# Patient Record
Sex: Male | Born: 1940 | Race: White | Hispanic: No | Smoking: Former smoker
Health system: Southern US, Community
[De-identification: ages and names within clinical notes are randomized; demographics above are authoritative.]

## PROBLEM LIST (undated history)

## (undated) DIAGNOSIS — M1A9XX0 Chronic gout, unspecified, without tophus (tophi): Secondary | ICD-10-CM

## (undated) DIAGNOSIS — G2581 Restless legs syndrome: Secondary | ICD-10-CM

## (undated) DIAGNOSIS — I10 Essential (primary) hypertension: Secondary | ICD-10-CM

## (undated) DIAGNOSIS — G4733 Obstructive sleep apnea (adult) (pediatric): Secondary | ICD-10-CM

## (undated) DIAGNOSIS — J449 Chronic obstructive pulmonary disease, unspecified: Secondary | ICD-10-CM

## (undated) DIAGNOSIS — I509 Heart failure, unspecified: Secondary | ICD-10-CM

## (undated) DIAGNOSIS — E119 Type 2 diabetes mellitus without complications: Secondary | ICD-10-CM

## (undated) HISTORY — PX: OTHER SURGICAL HISTORY: SHX169

## (undated) HISTORY — DX: Type 2 diabetes mellitus without complications: E11.9

## (undated) HISTORY — DX: Restless legs syndrome: G25.81

## (undated) HISTORY — DX: Morbid (severe) obesity due to excess calories: E66.01

## (undated) HISTORY — DX: Obstructive sleep apnea (adult) (pediatric): G47.33

## (undated) HISTORY — DX: Chronic gout, unspecified, without tophus (tophi): M1A.9XX0

---

## 2019-10-19 DIAGNOSIS — I2699 Other pulmonary embolism without acute cor pulmonale: Secondary | ICD-10-CM

## 2019-10-19 HISTORY — DX: Other pulmonary embolism without acute cor pulmonale: I26.99

## 2019-11-05 ENCOUNTER — Emergency Department (HOSPITAL_COMMUNITY)
Admission: EM | Admit: 2019-11-05 | Discharge: 2019-11-05 | Disposition: A | Discharge status: Home / Self Care | Payer: Medicare PPO | Attending: Emergency Medicine | Admitting: Emergency Medicine

## 2019-11-05 ENCOUNTER — Encounter (HOSPITAL_COMMUNITY): Payer: Self-pay | Admitting: Emergency Medicine

## 2019-11-05 ENCOUNTER — Other Ambulatory Visit: Payer: Self-pay

## 2019-11-05 ENCOUNTER — Emergency Department (HOSPITAL_COMMUNITY): Payer: Medicare PPO

## 2019-11-05 DIAGNOSIS — Z5321 Procedure and treatment not carried out due to patient leaving prior to being seen by health care provider: Secondary | ICD-10-CM | POA: Insufficient documentation

## 2019-11-05 DIAGNOSIS — R0602 Shortness of breath: Secondary | ICD-10-CM | POA: Insufficient documentation

## 2019-11-05 HISTORY — DX: Essential (primary) hypertension: I10

## 2019-11-05 HISTORY — DX: Chronic obstructive pulmonary disease, unspecified: J44.9

## 2019-11-05 NOTE — ED Triage Notes (Signed)
Patient reports increasing SOB over the past 3 weeks, became much worse 3 days ago. Patient seen at Urgent Care and urged to come to ED for eval.

## 2019-11-05 NOTE — ED Notes (Signed)
Note from Registration states patient left at 1800.

## 2019-11-06 ENCOUNTER — Other Ambulatory Visit: Payer: Self-pay

## 2019-11-06 ENCOUNTER — Emergency Department (HOSPITAL_COMMUNITY): Payer: Medicare PPO

## 2019-11-06 ENCOUNTER — Observation Stay (HOSPITAL_COMMUNITY)
Admission: EM | Admit: 2019-11-06 | Discharge: 2019-11-07 | Disposition: A | Discharge status: Home / Self Care | Payer: Medicare PPO | Attending: Internal Medicine | Admitting: Internal Medicine

## 2019-11-06 ENCOUNTER — Encounter (HOSPITAL_COMMUNITY): Payer: Self-pay | Admitting: Internal Medicine

## 2019-11-06 DIAGNOSIS — I2694 Multiple subsegmental pulmonary emboli without acute cor pulmonale: Principal | ICD-10-CM | POA: Insufficient documentation

## 2019-11-06 DIAGNOSIS — G4733 Obstructive sleep apnea (adult) (pediatric): Secondary | ICD-10-CM | POA: Diagnosis present

## 2019-11-06 DIAGNOSIS — Z7951 Long term (current) use of inhaled steroids: Secondary | ICD-10-CM | POA: Diagnosis not present

## 2019-11-06 DIAGNOSIS — E119 Type 2 diabetes mellitus without complications: Secondary | ICD-10-CM | POA: Diagnosis not present

## 2019-11-06 DIAGNOSIS — M109 Gout, unspecified: Secondary | ICD-10-CM | POA: Insufficient documentation

## 2019-11-06 DIAGNOSIS — Z7984 Long term (current) use of oral hypoglycemic drugs: Secondary | ICD-10-CM | POA: Diagnosis not present

## 2019-11-06 DIAGNOSIS — J449 Chronic obstructive pulmonary disease, unspecified: Secondary | ICD-10-CM | POA: Diagnosis present

## 2019-11-06 DIAGNOSIS — I10 Essential (primary) hypertension: Secondary | ICD-10-CM | POA: Diagnosis not present

## 2019-11-06 DIAGNOSIS — R011 Cardiac murmur, unspecified: Secondary | ICD-10-CM | POA: Diagnosis not present

## 2019-11-06 DIAGNOSIS — Z87891 Personal history of nicotine dependence: Secondary | ICD-10-CM | POA: Insufficient documentation

## 2019-11-06 DIAGNOSIS — Z79899 Other long term (current) drug therapy: Secondary | ICD-10-CM | POA: Diagnosis not present

## 2019-11-06 DIAGNOSIS — R0602 Shortness of breath: Secondary | ICD-10-CM | POA: Diagnosis present

## 2019-11-06 DIAGNOSIS — Z20828 Contact with and (suspected) exposure to other viral communicable diseases: Secondary | ICD-10-CM | POA: Insufficient documentation

## 2019-11-06 DIAGNOSIS — I2693 Single subsegmental pulmonary embolism without acute cor pulmonale: Secondary | ICD-10-CM | POA: Diagnosis present

## 2019-11-06 LAB — CBC
HCT: 43.9 % (ref 39.0–52.0)
Hemoglobin: 15 g/dL (ref 13.0–17.0)
MCH: 34.9 pg — ABNORMAL HIGH (ref 26.0–34.0)
MCHC: 34.2 g/dL (ref 30.0–36.0)
MCV: 102.1 fL — ABNORMAL HIGH (ref 80.0–100.0)
Platelets: 170 10*3/uL (ref 150–400)
RBC: 4.3 MIL/uL (ref 4.22–5.81)
RDW: 14.1 % (ref 11.5–15.5)
WBC: 7.2 10*3/uL (ref 4.0–10.5)
nRBC: 0 % (ref 0.0–0.2)

## 2019-11-06 LAB — COMPREHENSIVE METABOLIC PANEL
ALT: 30 U/L (ref 0–44)
AST: 25 U/L (ref 15–41)
Albumin: 3.5 g/dL (ref 3.5–5.0)
Alkaline Phosphatase: 54 U/L (ref 38–126)
Anion gap: 10 (ref 5–15)
BUN: 14 mg/dL (ref 8–23)
CO2: 24 mmol/L (ref 22–32)
Calcium: 9.3 mg/dL (ref 8.9–10.3)
Chloride: 106 mmol/L (ref 98–111)
Creatinine, Ser: 0.97 mg/dL (ref 0.61–1.24)
GFR calc Af Amer: >60 mL/min (ref >60)
GFR calc non Af Amer: >60 mL/min (ref >60)
Glucose, Bld: 100 mg/dL — ABNORMAL HIGH (ref 70–99)
Potassium: 4 mmol/L (ref 3.5–5.1)
Sodium: 140 mmol/L (ref 135–145)
Total Bilirubin: 0.6 mg/dL (ref 0.3–1.2)
Total Protein: 6.8 g/dL (ref 6.5–8.1)

## 2019-11-06 LAB — SARS CORONAVIRUS 2 (TAT 6-24 HRS): SARS Coronavirus 2: NEGATIVE

## 2019-11-06 LAB — D-DIMER, QUANTITATIVE: D-Dimer, Quant: 5.89 ug/mL-FEU — ABNORMAL HIGH (ref 0.00–0.50)

## 2019-11-06 LAB — POC SARS CORONAVIRUS 2 AG -  ED: SARS Coronavirus 2 Ag: NEGATIVE

## 2019-11-06 MED ORDER — FLUTICASONE FUROATE-VILANTEROL 200-25 MCG/INH IN AEPB
1.0000 | INHALATION_SPRAY | Freq: Every day | RESPIRATORY_TRACT | Status: DC
  Administered 2019-11-07: 1 via RESPIRATORY_TRACT
  Filled 2019-11-06: qty 28

## 2019-11-06 MED ORDER — METFORMIN HCL 500 MG PO TABS
500.0000 mg | ORAL_TABLET | Freq: Two times a day (BID) | ORAL | Status: DC
  Administered 2019-11-07: 500 mg via ORAL
  Filled 2019-11-06: qty 1

## 2019-11-06 MED ORDER — INFLUENZA VAC A&B SA ADJ QUAD 0.5 ML IM PRSY: 0.5000 mL | PREFILLED_SYRINGE | INTRAMUSCULAR | Status: DC

## 2019-11-06 MED ORDER — ALBUTEROL SULFATE HFA 108 (90 BASE) MCG/ACT IN AERS
2.0000 | INHALATION_SPRAY | RESPIRATORY_TRACT | Status: DC | PRN
  Administered 2019-11-06: 2 via RESPIRATORY_TRACT
  Filled 2019-11-06: qty 6.7

## 2019-11-06 MED ORDER — LISINOPRIL 40 MG PO TABS
40.0000 mg | ORAL_TABLET | Freq: Every day | ORAL | Status: DC
  Administered 2019-11-07: 40 mg via ORAL
  Filled 2019-11-06: qty 1

## 2019-11-06 MED ORDER — ALLOPURINOL 300 MG PO TABS
300.0000 mg | ORAL_TABLET | Freq: Every day | ORAL | Status: DC
  Administered 2019-11-07: 300 mg via ORAL
  Filled 2019-11-06: qty 1

## 2019-11-06 MED ORDER — HEPARIN (PORCINE) 25000 UT/250ML-% IV SOLN
1300.0000 [IU]/h | INTRAVENOUS | Status: DC
  Administered 2019-11-06: 1700 [IU]/h via INTRAVENOUS
  Administered 2019-11-07: 1500 [IU]/h via INTRAVENOUS
  Filled 2019-11-06 (×2): qty 250

## 2019-11-06 MED ORDER — IOHEXOL 350 MG/ML SOLN
75.0000 mL | Freq: Once | INTRAVENOUS | Status: AC | PRN
  Administered 2019-11-06: 15:00:00 75 mL via INTRAVENOUS

## 2019-11-06 MED ORDER — ALBUTEROL SULFATE HFA 108 (90 BASE) MCG/ACT IN AERS
1.0000 | INHALATION_SPRAY | Freq: Four times a day (QID) | RESPIRATORY_TRACT | Status: DC | PRN
  Filled 2019-11-06: qty 6.7

## 2019-11-06 MED ORDER — HEPARIN BOLUS VIA INFUSION
6500.0000 [IU] | Freq: Once | INTRAVENOUS | Status: AC
  Administered 2019-11-06: 16:00:00 6500 [IU] via INTRAVENOUS
  Filled 2019-11-06: qty 6500

## 2019-11-06 MED ORDER — PREDNISONE 20 MG PO TABS
60.0000 mg | ORAL_TABLET | Freq: Once | ORAL | Status: AC
  Administered 2019-11-06: 13:00:00 60 mg via ORAL
  Filled 2019-11-06: qty 3

## 2019-11-06 MED ORDER — DEXAMETHASONE 4 MG PO TABS: 6.0000 mg | ORAL_TABLET | Freq: Every day | ORAL | Status: DC

## 2019-11-06 NOTE — ED Notes (Signed)
Pt ambulated in room on pulse ox. Pt o2 was 89-91% on RA. Pt was exteremly SOB when ambulating in room.

## 2019-11-06 NOTE — ED Provider Notes (Signed)
[facility] EMERGENCY DEPARTMENT Provider Note   CSN: [id number] Arrival date & time: 11/06/19  1122     History Chief Complaint  Patient presents with  . Shortness of Breath    Vincent Cooper is a 78 y.o. male.  HPI     89 male presents today complaining of shortness of breath.  He states he has had increasing shortness of breath over the past 3 to 4 days.  He has had a cough productive of whitish sputum.  Dyspnea worsens with any exertion sats dropped down to 88% per his and nursing report.  However he is not short of breath after he sits down and does not move he denies any fever, chills, nasal congestion, sore throat, headache, or other symptoms consistent with Covid.  He denies any known Covid exposures.  He was seen his primary care doctor yesterday in [place].  Reports he had a chest x-Saturnino Liew there.  He brings these records with him.  The chest x-Arryn Terrones impression reviewed and states no acute pulmonary process.  He was screened there for Covid and had a swab pending.  He denies any known exposure.  He reports that he lives alone.  States he has been going out to the grocery store and has been wearing a mask.  He is a former smoker but quit more than 30 years ago.  He reports taking his usual medications. Denies pain, peripheral swelling, history of DVT or PE.  Past Medical History:  Diagnosis Date  . COPD (chronic obstructive pulmonary disease) ([place])   . Diabetes mellitus without complication ([place])   . Hypertension     There are no problems to display for this patient.   No past surgical history on file.     No family history on file.  Social History   Tobacco Use  . Smoking status: Former Research scientist (life sciences)  . Smokeless tobacco: Former Network engineer Use Topics  . Alcohol use: Yes    Comment: Daily 2 shots of vodka  . Drug use: Never    Home Medications Prior to Admission medications   Not on File    Allergies    Other  Review of Systems   Review of  Systems  All other systems reviewed and are negative.   Physical Exam Updated Vital Signs BP (!) 143/69 (BP Location: Left Arm)   Pulse 83   Temp 98 F (36.7 C) (Oral)   Resp 16   SpO2 93%   Physical Exam Vitals reviewed.  Constitutional:      General: He is not in acute distress.    Appearance: He is well-developed. He is obese. He is not ill-appearing.  HENT:     Head: Normocephalic.     Mouth/Throat:     Mouth: Mucous membranes are moist.  Eyes:     Pupils: Pupils are equal, round, and reactive to light.  Cardiovascular:     Rate and Rhythm: Normal rate and regular rhythm.  Pulmonary:     Effort: Pulmonary effort is normal.     Breath sounds: Examination of the right-lower field reveals wheezing. Examination of the left-lower field reveals wheezing. Wheezing present.     Comments: Few mild expiratory wheezes Abdominal:     General: Bowel sounds are normal.     Palpations: Abdomen is soft.  Musculoskeletal:        General: Normal range of motion.     Cervical back: Normal range of motion.     Comments: Bilateral lower  extremity edema with slight erythema of the left lower extremity versus the right  Skin:    General: Skin is warm.     Capillary Refill: Capillary refill takes less than 2 seconds.  Neurological:     General: No focal deficit present.     Mental Status: He is alert and oriented to person, place, and time.  Psychiatric:        Mood and Affect: Mood normal.        Behavior: Behavior normal.     ED Results / Procedures / Treatments   Labs (all labs ordered are listed, but only abnormal results are displayed) Labs Reviewed - No data to display  EKG EKG Interpretation  Date/Time:  Saturday November 06 2019 11:46:37 EST Ventricular Rate:  81 PR Interval:    QRS Duration: 84 QT Interval:  366 QTC Calculation: 425 R Axis:   77 Text Interpretation: Sinus rhythm Baseline wander in lead(s) V3 V4 Poor data quality in current ECG precludes serial  comparison Confirmed by Pattricia Boss [phone]) on 11/06/2019 1:30:24 PM   Radiology CT Angio Chest PE W and/or Wo Contrast  Result Date: 11/06/2019 CLINICAL DATA:  Question of pulmonary embolism EXAM: CT ANGIOGRAPHY CHEST WITH CONTRAST TECHNIQUE: Multidetector CT imaging of the chest was performed using the standard protocol during bolus administration of intravenous contrast. Multiplanar CT image reconstructions and MIPs were obtained to evaluate the vascular anatomy. CONTRAST:  3mL OMNIPAQUE IOHEXOL 350 MG/ML SOLN COMPARISON:  None. FINDINGS: Cardiovascular: There is slightly suboptimal opacification of the main pulmonary artery. There is partially occlusive thrombus seen within the main posterior right lower lobe segmental and subsegmental pulmonary arterial branches. There is also probable small thrombus seen within anterior right middle lobe subsegmental pulmonary arterial branches. No evidence of right ventricular heart strain. The heart is normal in size. No pericardial effusion or thickening. There is normal three-vessel brachiocephalic anatomy without proximal stenosis. Scattered atherosclerosis is noted at the aortic arch and descending intrathoracic aorta. There is a small amount calcification at the origins of the great vessels. Coronary artery calcifications are seen. Mediastinum/Nodes: No hilar, mediastinal, or axillary adenopathy. Thyroid gland, trachea, and esophagus demonstrate no significant findings. Lungs/Pleura: A small amount of both subpleural bleb formation and emphysematous changes at the lung apices. No large airspace consolidation or pleural effusion. Upper Abdomen: No acute abnormalities present in the visualized portions of the upper abdomen. Musculoskeletal: No chest wall abnormality. No acute or significant osseous findings. Anterior flowing osteophytes seen in the midthoracic spine. Review of the MIP images confirms the above findings. IMPRESSION: 1. Slightly suboptimal  opacification of the main pulmonary artery. 2. Partially occlusive thrombus in the posterior right segmental and subsegmental pulmonary arterial branch and right middle lobe subsegmental branches. 3. No evidence of right ventricular heart strain. 4.  Aortic Atherosclerosis (ICD10-I70.0). 5. 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 6. These results were called by telephone at the time of interpretation on 11/06/2019 at 3:07 pm to provider [facility] Milayna Rotenberg , who verbally acknowledged these results. Electronically Signed   By: Prudencio Pair M.D.   On: 11/06/2019 15:09   DG Chest Port 1 View  Result Date: 11/06/2019 CLINICAL DATA:  Shortness of breath. EXAM: PORTABLE CHEST 1 VIEW COMPARISON:  None FINDINGS: Cardiomediastinal contours are mildly enlarged. Low lung volumes without signs of consolidation or pleural effusion. No signs of acute bone finding. IMPRESSION: Mild cardiac enlargement with low volumes and without acute cardiopulmonary process. The Electronically Signed   By: Cay Schillings  Wile M.D.   On: 11/06/2019 12:31    Procedures .Critical Care Performed by: Pattricia Boss, MD Authorized by: Pattricia Boss, MD   Critical care provider statement:    Critical care time (minutes):  45   Critical care end time:  11/06/2019 3:17 PM   Critical care was necessary to treat or prevent imminent or life-threatening deterioration of the following conditions:  Respiratory failure   Critical care was time spent personally by me on the following activities:  Discussions with consultants, evaluation of patient's response to treatment, examination of patient, ordering and performing treatments and interventions, ordering and review of laboratory studies, ordering and review of radiographic studies, pulse oximetry, re-evaluation of patient's condition, obtaining history from patient or surrogate and review of old charts   (including critical care time)  Medications Ordered in  ED Medications  albuterol (VENTOLIN HFA) 108 (90 Base) MCG/ACT inhaler 2 puff (has no administration in time range)  predniSONE (DELTASONE) tablet 60 mg (has no administration in time range)    ED Course  I have reviewed the triage vital signs and the nursing notes.  Pertinent labs & imaging results that were available during my care of the patient were reviewed by me and considered in my medical decision making (see chart for details). 78 year old man history of COPD presents today with worsening dyspnea over the past 3 to 4 days.  He has been seen and evaluated his primary care office and diagnosed [place].  There he had a Covid test and chest x-Bushra Denman that were reported as normal.  Here today he appears well sitting on the bed reports that he gets dyspneic with any exertion.  Work-up included chest x-Krisanne Lich that showed no evidence of acute abnormality, point-of-care Covid test that was negative, EKG without acute abnormalities and CBC and chemistry that showed no definitive etiology for symptoms.  D-dimer is elevated at 5.89 and subsequently CT angio chest obtained.  Received call from radiologist that shows segmental and subsegmental PE without acute right heart strain.  Heparin has been ordered.  Plan admission for further evaluation and treatment.  Discussed with Dr. Eileen Stanford and will see for admission MDM Rules/Calculators/A&P                     Vincent Cooper was evaluated in Emergency Department on 11/06/2019 for the symptoms described in the history of present illness. He was evaluated in the context of the global COVID-19 pandemic, which necessitated consideration that the patient might be at risk for infection with the SARS-CoV-2 virus that causes COVID-19. Institutional protocols and algorithms that pertain to the evaluation of patients at risk for COVID-19 are in a state of rapid change based on information released by regulatory bodies including the CDC and federal and state organizations. These  policies and algorithms were followed during the patient's care in the ED. Final Clinical Impression(s) / ED Diagnoses Final diagnoses:  Multiple subsegmental pulmonary emboli without acute cor pulmonale ([place])    Rx / DC Orders ED Discharge Orders    None       Pattricia Boss, MD 11/06/19 1537

## 2019-11-06 NOTE — Plan of Care (Signed)
  Problem: Health Behavior/Discharge Planning: Goal: Ability to manage health-related needs will improve Outcome: Progressing   Problem: Education: Goal: Knowledge of General Education information will improve Description: Including pain rating scale, medication(s)/side effects and non-pharmacologic comfort measures Outcome: Progressing   Problem: Clinical Measurements: Goal: Ability to maintain clinical measurements within normal limits will improve Outcome: Progressing Goal: Will remain free from infection Outcome: Progressing Goal: Diagnostic test results will improve Outcome: Progressing Goal: Respiratory complications will improve Outcome: Progressing Goal: Cardiovascular complication will be avoided Outcome: Progressing   Problem: Activity: Goal: Risk for activity intolerance will decrease Outcome: Progressing   Problem: Nutrition: Goal: Adequate nutrition will be maintained Outcome: Progressing   Problem: Coping: Goal: Level of anxiety will decrease Outcome: Progressing   Problem: Elimination: Goal: Will not experience complications related to bowel motility Outcome: Progressing Goal: Will not experience complications related to urinary retention Outcome: Progressing   Problem: Pain Managment: Goal: General experience of comfort will improve Outcome: Progressing   Problem: Safety: Goal: Ability to remain free from injury will improve Outcome: Progressing   Problem: Skin Integrity: Goal: Risk for impaired skin integrity will decrease Outcome: Progressing   

## 2019-11-06 NOTE — ED Triage Notes (Signed)
Patient complains of increased shortness of breath over the last 2-3 days. States he normally is short of breath, but that a few days ago he becomes noticeably more so after exertion. Does not wear oxygen at baseline, does sleep with a CPAP for sleep apnea.

## 2019-11-06 NOTE — H&P (Addendum)
Date: 11/06/2019               Patient Name:  Vincent Cooper MRN: [id number]  DOB: [birth year] Age / Sex: 78 y.o., male   PCP: [name], DO         Medical Service: Internal Medicine Teaching Service         Attending Physician: Dr. Lenice Pressman    First Contact: Dr. Gilford Rile Pager: [phone]  Second Contact: Dr. Eileen Stanford Pager: [phone]       After Hours (After 5p/  First Contact Pager: [phone]  weekends / holidays): Second Contact Pager: [phone]   Chief Complaint: Shortness of breath  History of Present Illness: Mr. Beeghly is a 78 y/o male, with a PMH of COPD, diabetes mellitus, and hypertension who presented to [facility] ED on November 05, 2019 with worsening dyspnea on exertion.  He was in his usual state of health until 2 nights ago when he began experiencing dyspnea that has been progressively worsening. He states that he dyspnea worsens after walking about 50 ft. He does have a home pulse Ox with O2 saturation in the 80s with ambulation, and increased to the 90s with 3-4 minutes of rest. At baseline, he was able to complete his activities of daily living but is currently limited by shortness of breath. He states that he visited his PCP in [place] who ordered a chest x-ray and Covid test however he is unsure of the results. He does not report of any recent exposure to coronavirus and states that he takes good care of himself and usually wears a mask each time he leaves the house.  He also endorses cough productive of white sputum which seems to be chronic and unchanged from his baseline. He denies chest pain, nausea, vomiting, recent long car rides, prolonged inactiveness, family history of DVT/PE. He also reports of 3 week history of lower extremity edema (mostly foot swelling) which he noticed initially on the right lower extremity and ultimately on the left lower extremity.    ED course: Afebrile, pulse 70s-90s, RR 14-21, BP 140s-150s/60s-70s, SPO2 93-100% on room air.  CBC  shows macrocytosis with MCV of 102, CMP unremarkable, D-dimer elevated at 5.8, point-of-care coronavirus test unremarkable, CT angiography chest reviewed occlusive thrombus in the posterior right segmental and subsegmental pulmonary artery branch and right middle lobe subsegmental branches, no evidence of right heart strain.  He was started on heparin and internal medicine teaching service was consulted for admission.   Meds: Current Meds  Medication Sig  . albuterol (VENTOLIN HFA) 108 (90 Base) MCG/ACT inhaler Inhale 1 puff into the lungs every 6 (six) hours as needed for wheezing or shortness of breath.  . allopurinol (ZYLOPRIM) 300 MG tablet Take 300 mg by mouth daily.  . budesonide-formoterol (SYMBICORT) 160-4.5 MCG/ACT inhaler Inhale 2 puffs into the lungs every morning.  Marland Kitchen lisinopril (ZESTRIL) 40 MG tablet Take 40 mg by mouth daily.  . metFORMIN (GLUCOPHAGE) 500 MG tablet Take 500 mg by mouth 2 (two) times daily with a meal.     Allergies: Allergies as of 11/06/2019 - Review Complete 11/06/2019  Allergen Reaction Noted  . Other  11/05/2019   Past Medical History:  Diagnosis Date  . COPD (chronic obstructive pulmonary disease) ([place])   . Diabetes mellitus without complication ([place])   . Hypertension     Family History:  - Denies FH of DM. - Denies FH of cancer.  Social History:  - Has EtOH drink a night,  but has not had a drink in 4-5 days.  - Previous tobacco user. Quit smoking 20 years ago, and stopped his dip use 6 months ago.  - Lives home alone.  - Currently starting a new diet in attempts to lose weight.   Review of Systems: A complete ROS was negative except as per HPI.   Physical Exam: Blood pressure 134/72, pulse 86, temperature 98 F (36.7 C), temperature source Oral, resp. rate 16, SpO2 (!) 87 %. Physical Exam Vitals and nursing note reviewed.  Constitutional:      General: He is not in acute distress.    Appearance: Normal appearance. He is well-developed.  He is not ill-appearing or toxic-appearing.     Comments: Sitting comfortably in bed. No acute distress.   HENT:     Head: Normocephalic and atraumatic.  Eyes:     General:        Right eye: No discharge.        Left eye: No discharge.     Conjunctiva/sclera: Conjunctivae normal.  Cardiovascular:     Rate and Rhythm: Normal rate and regular rhythm.     Pulses: Normal pulses.     Heart sounds: Normal heart sounds. No murmur. No friction rub. No gallop.   Pulmonary:     Effort: Pulmonary effort is normal.     Breath sounds: Examination of the left-upper field reveals wheezing. Wheezing present. No decreased breath sounds, rhonchi or rales.     Comments: Wheezes auscultated in the upper left lung field.  Abdominal:     General: Bowel sounds are normal.     Palpations: Abdomen is soft.     Tenderness: There is no abdominal tenderness. There is no guarding.  Musculoskeletal:        General: No swelling.     Right lower leg: No edema.     Left lower leg: No edema.  Neurological:     General: No focal deficit present.     Mental Status: He is alert and oriented to person, place, and time.  Psychiatric:        Mood and Affect: Mood normal.        Behavior: Behavior normal.     EKG: personally reviewed my interpretation is sinus rhythm with no R heart strain.  CXR: personally reviewed my interpretation is mild cardiomegaly with no pleural effusion or consolidation.   CT Angio Chest PE w or wo Contrast:  IMPRESSION: 1. Slightly suboptimal opacification of the main pulmonary artery. 2. Partially occlusive thrombus in the posterior right segmental and subsegmental pulmonary arterial branch and right middle lobe subsegmental branches. 3. No evidence of right ventricular heart strain. 4.  Aortic Atherosclerosis (ICD10-I70.0). 5. 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 6. These results were called by telephone at the time of interpretation on  11/06/2019 at 3:07 pm to provider [facility] RAY, who verbally acknowledged these results.  Assessment & Plan by Problem: Active Problems:   Pulmonary embolism [facility]) Mr. Jazper Jolly is a 78 y/o male, with a PMH of COPD, diabetes mellitus, and HTN, who presents to IMTS from the [facility] with Pulmonary Emboli.    Patient presented to the ED with [facility], and hypoxia while ambulating, which was confirmed from a home pulse ox as well as the ED. His D-dimer was elevated and a CT angio chest revealed a segmental and subsegmental PE with and EKG that showed no R heart strain. He was subsequently placed on heparin.   Assessment:  Pulmonary Emboli:  - Patient found to have segmental and subsegmental PE on CT angio.  - Continue heparin - Heparin Level ordered - CBC ordered - Continue to monitor vitals - Continue Telemetry   COPD:  - Continue Breo Ellipta  1 puff QD - Continue Albuterol 1 puff q6H PRN - Continue to monitor vitals   Gout:  - Continue home allopurinol 300 mg QD   Hypertension:  - Continue Lisinopril 40 mg QD.    OSA:  - CPAP ordered QHS PRN.   DM:  - Continue Metformin 500 mg BID. - BMP ordered  Diet: Low sodium  VTE ppx: Lovenox CODE STATUS: FULL  Dispo: Admit patient to Observation with expected length of stay less than 2 midnights.  Signed: Maudie Mercury, MD 11/06/2019, 5:18 PM  Pager: [phone] Internal Medicine Teaching Service

## 2019-11-06 NOTE — Progress Notes (Signed)
[place] for heparin Indication: pulmonary embolus  Heparin Dosing Weight: 97.2 kg  Labs: Recent Labs    11/06/19 1240  HGB 15.0  HCT 43.9  PLT 170  CREATININE 0.97    Assessment: 28 yom presenting with SOB, found to have acute PE with no RHS. Pharmacy consulted to dose heparin. Patient is not on anticoagulation PTA. CBC wnl. No active bleed issues documented.  Goal of Therapy:  Heparin level 0.3-0.7 units/ml Monitor platelets by anticoagulation protocol: Yes   Plan:  Heparin 6500 unit bolus Start heparin at 1700 units/h 6h heparin level Daily heparin level/CBC Monitor s/sx bleeding   Elicia Lamp, PharmD, BCPS Clinical Pharmacist 11/06/2019 3:12 PM

## 2019-11-07 ENCOUNTER — Observation Stay (HOSPITAL_BASED_OUTPATIENT_CLINIC_OR_DEPARTMENT_OTHER): Payer: Medicare PPO

## 2019-11-07 DIAGNOSIS — J449 Chronic obstructive pulmonary disease, unspecified: Secondary | ICD-10-CM

## 2019-11-07 DIAGNOSIS — R0602 Shortness of breath: Secondary | ICD-10-CM | POA: Diagnosis not present

## 2019-11-07 DIAGNOSIS — R011 Cardiac murmur, unspecified: Secondary | ICD-10-CM | POA: Diagnosis not present

## 2019-11-07 DIAGNOSIS — Z79899 Other long term (current) drug therapy: Secondary | ICD-10-CM

## 2019-11-07 DIAGNOSIS — I10 Essential (primary) hypertension: Secondary | ICD-10-CM | POA: Diagnosis present

## 2019-11-07 DIAGNOSIS — Z7984 Long term (current) use of oral hypoglycemic drugs: Secondary | ICD-10-CM

## 2019-11-07 DIAGNOSIS — E119 Type 2 diabetes mellitus without complications: Secondary | ICD-10-CM

## 2019-11-07 DIAGNOSIS — G4733 Obstructive sleep apnea (adult) (pediatric): Secondary | ICD-10-CM

## 2019-11-07 DIAGNOSIS — I2693 Single subsegmental pulmonary embolism without acute cor pulmonale: Secondary | ICD-10-CM

## 2019-11-07 DIAGNOSIS — Z7951 Long term (current) use of inhaled steroids: Secondary | ICD-10-CM

## 2019-11-07 DIAGNOSIS — M109 Gout, unspecified: Secondary | ICD-10-CM

## 2019-11-07 DIAGNOSIS — Z9989 Dependence on other enabling machines and devices: Secondary | ICD-10-CM

## 2019-11-07 LAB — CBC
HCT: 37.4 % — ABNORMAL LOW (ref 39.0–52.0)
Hemoglobin: 12.7 g/dL — ABNORMAL LOW (ref 13.0–17.0)
MCH: 34.4 pg — ABNORMAL HIGH (ref 26.0–34.0)
MCHC: 34 g/dL (ref 30.0–36.0)
MCV: 101.4 fL — ABNORMAL HIGH (ref 80.0–100.0)
Platelets: 177 10*3/uL (ref 150–400)
RBC: 3.69 MIL/uL — ABNORMAL LOW (ref 4.22–5.81)
RDW: 13.9 % (ref 11.5–15.5)
WBC: 8.9 10*3/uL (ref 4.0–10.5)
nRBC: 0 % (ref 0.0–0.2)

## 2019-11-07 LAB — BASIC METABOLIC PANEL
Anion gap: 9 (ref 5–15)
BUN: 16 mg/dL (ref 8–23)
CO2: 23 mmol/L (ref 22–32)
Calcium: 8.8 mg/dL — ABNORMAL LOW (ref 8.9–10.3)
Chloride: 105 mmol/L (ref 98–111)
Creatinine, Ser: 0.79 mg/dL (ref 0.61–1.24)
GFR calc Af Amer: >60 mL/min (ref >60)
GFR calc non Af Amer: >60 mL/min (ref >60)
Glucose, Bld: 158 mg/dL — ABNORMAL HIGH (ref 70–99)
Potassium: 4 mmol/L (ref 3.5–5.1)
Sodium: 137 mmol/L (ref 135–145)

## 2019-11-07 LAB — ECHOCARDIOGRAM COMPLETE
Height: 69 in
Weight: 4160 oz

## 2019-11-07 LAB — HEPARIN LEVEL (UNFRACTIONATED)
Heparin Unfractionated: 0.97 IU/mL — ABNORMAL HIGH (ref 0.30–0.70)
Heparin Unfractionated: 0.9 IU/mL — ABNORMAL HIGH (ref 0.30–0.70)

## 2019-11-07 MED ORDER — APIXABAN 5 MG PO TABS: 5.0000 mg | ORAL_TABLET | Freq: Two times a day (BID) | ORAL | Status: DC

## 2019-11-07 MED ORDER — APIXABAN 5 MG PO TABS: 10.0000 mg | ORAL_TABLET | Freq: Two times a day (BID) | ORAL | Status: DC

## 2019-11-07 MED ORDER — ENOXAPARIN SODIUM 300 MG/3ML IJ SOLN: 1.5000 mg/kg | INTRAMUSCULAR | Status: DC

## 2019-11-07 MED ORDER — APIXABAN 5 MG PO TABS: 10.0000 mg | ORAL_TABLET | Freq: Two times a day (BID) | ORAL | 0 refills | Status: DC

## 2019-11-07 MED ORDER — APIXABAN 5 MG PO TABS: 5.0000 mg | ORAL_TABLET | Freq: Two times a day (BID) | ORAL | 3 refills | Status: DC

## 2019-11-07 MED ORDER — APIXABAN 5 MG PO TABS
10.0000 mg | ORAL_TABLET | Freq: Two times a day (BID) | ORAL | Status: DC
  Administered 2019-11-07: 10 mg via ORAL
  Filled 2019-11-07: qty 2

## 2019-11-07 NOTE — Progress Notes (Signed)
[place] for heparin Indication: pulmonary embolus  Heparin Dosing Weight: 97.2 kg  Labs: Recent Labs    11/06/19 1240 11/06/19 2308  HGB 15.0  --   HCT 43.9  --   PLT 170  --   HEPARINUNFRC  --  0.97*  CREATININE 0.97  --     Assessment: 78 y.o. male with PE for heparin  Goal of Therapy:  Heparin level 0.3-0.7 units/ml Monitor platelets by anticoagulation protocol: Yes   Plan:  Decrease Heparin 1500 units/hr Follow-up am labs.  Phillis Knack, PharmD, BCPS  11/07/2019 12:31 AM

## 2019-11-07 NOTE — Progress Notes (Signed)
CSW acknowledges consult for new Elloquis. TOC staff will provide resources for medication assistance.   CSW will continue to follow and assist with additional TOC needs.    Domenic Schwab, MSW, [place] Worker [facility]  [phone]

## 2019-11-07 NOTE — Discharge Instructions (Signed)
Information on my medicine - ELIQUIS (apixaban)  Why was Eliquis prescribed for you? Eliquis was prescribed to treat blood clots that may have been found in the veins of your legs (deep vein thrombosis) or in your lungs (pulmonary embolism) and to reduce the risk of them occurring again.  What do You need to know about Eliquis ? The starting dose is 10 mg (two 5 mg tablets) taken TWICE daily for the FIRST SEVEN (7) DAYS, then on (enter date)  11/14/2019  the dose is reduced to ONE 5 mg tablet taken TWICE daily.  Eliquis may be taken with or without food.   Try to take the dose about the same time in the morning and in the evening. If you have difficulty swallowing the tablet whole please discuss with your pharmacist how to take the medication safely.  Take Eliquis exactly as prescribed and DO NOT stop taking Eliquis without talking to the doctor who prescribed the medication.  Stopping may increase your risk of developing a new blood clot.  Refill your prescription before you run out.  After discharge, you should have regular check-up appointments with your healthcare provider that is prescribing your Eliquis.    What do you do if you miss a dose? If a dose of ELIQUIS is not taken at the scheduled time, take it as soon as possible on the same day and twice-daily administration should be resumed. The dose should not be doubled to make up for a missed dose.  Important Safety Information A possible side effect of Eliquis is bleeding. You should call your healthcare provider right away if you experience any of the following: ? Bleeding from an injury or your nose that does not stop. ? Unusual colored urine (red or dark brown) or unusual colored stools (red or black). ? Unusual bruising for unknown reasons. ? A serious fall or if you hit your head (even if there is no bleeding).  Some medicines may interact with Eliquis and might increase your risk of bleeding or clotting while on  Eliquis. To help avoid this, consult your healthcare provider or pharmacist prior to using any new prescription or non-prescription medications, including herbals, vitamins, non-steroidal anti-inflammatory drugs (NSAIDs) and supplements.  This website has more information on Eliquis (apixaban): http://www.eliquis.com/eliquis/home

## 2019-11-07 NOTE — Progress Notes (Signed)
Verbally phoned Eliquis starter pack prescription to [facility] on [address], [place], [place]. Alford Highland, PharmD, BCPS Clinical Staff Pharmacist [email or web address]

## 2019-11-07 NOTE — Care Management (Signed)
Provided Eliquis card to patient and family at bedside. Could not reach MD to clarify where Rx sent.  Vincent Cooper who will [facility] script to [facility].  Patient and family at bedside agreeable to pick up Rx at pharmacy.

## 2019-11-07 NOTE — Progress Notes (Signed)
Received call from CCMD re: patient SVT. MD notified. No new orders. Will continue to monitor.

## 2019-11-07 NOTE — Progress Notes (Signed)
Pt requested to go to the bathroom, RN removed CPAP mask to allow patient to ambulate to the restroom. Upon returning to bed, pt requests CPAP machine turned off and states "I've worn it long enough tonight, I will be fine without it for the rest of the night." Pt was educated on importance of wearing the mask while sleeping and pt understood. Will continue to monitor.

## 2019-11-07 NOTE — Progress Notes (Signed)
  Echocardiogram 2D Echocardiogram has been performed.  Johny Chess 11/07/2019, 11:14 AM

## 2019-11-07 NOTE — Progress Notes (Signed)
ANTICOAGULATION CONSULT NOTE - Follow Up Consult  Pharmacy Consult for Heparin Indication: pulmonary embolus  Allergies  Allergen Reactions  . Other     Patient reports he was allergic to something in an IV he was given but does not know what the substance was.    Patient Measurements: Height: 5\' 9"  (175.3 cm) Weight: 260 lb (117.9 kg) IBW/kg (Calculated) : 70.7 Heparin Dosing Weight: 97.2 kg  Vital Signs: Temp: 97.7 F (36.5 C) (12/20 0722) Temp Source: Oral (12/20 0722) BP: 168/112 (12/20 0722) Pulse Rate: 79 (12/20 0722)  Labs: Recent Labs    11/06/19 1240 11/06/19 2308 11/07/19 0500 11/07/19 0653  HGB 15.0  --  12.7*  --   HCT 43.9  --  37.4*  --   PLT 170  --  177  --   HEPARINUNFRC  --  0.97*  --  0.90*  CREATININE 0.97  --  0.79  --     Estimated Creatinine Clearance: 98 mL/min (by C-G formula based on SCr of 0.79 mg/dL).  Assessment: Anticoag:  PE. Heparin level 0.97>0.9 (in 6 hrs). Hgb 15>>12.7. Plts stable.  Goal of Therapy:  Heparin level 0.3-0.7 units/ml Monitor platelets by anticoagulation protocol: Yes   Plan:  Decrease IV heparin to 1300 units/hr (13 units/kg/hr) Recheck heparin level in 8 hrs Daily heparin level/CBC  Bralyn Espino S. Alford Highland, PharmD, BCPS Clinical Staff Pharmacist [email or web address] Alford Highland, Thomas 11/07/2019,8:09 AM

## 2019-11-07 NOTE — Progress Notes (Addendum)
Subjective:   Vincent Cooper was examined this morning and he complained about his CPAP mask as it did not fit right on his face. He slept quite well and denies shortness of breath, pleuritic pain. We discussed switching his medications to eliquis today from his IV heparin. He was agreeable to the plan.    Objective:  Vital signs in last 24 hours: Vitals:   11/06/19 1645 11/06/19 1700 11/06/19 2319 11/07/19 0000  BP: 134/72 (!) 147/72 131/67   Pulse: 86 89 78 77  Resp: 16 18 20 18   Temp:  97.8 F (36.6 C) 97.7 F (36.5 C)   TempSrc:  Oral Oral   SpO2: (!) 87% 93% 96% 97%  Weight:  117.9 kg    Height:  5\' 9"  (1.753 m)     Physical Exam Vitals and nursing note reviewed.  Constitutional:      General: He is not in acute distress.    Appearance: Normal appearance. He is not ill-appearing or toxic-appearing.  HENT:     Head: Normocephalic and atraumatic.  Eyes:     General:        Right eye: No discharge.        Left eye: No discharge.     Conjunctiva/sclera: Conjunctivae normal.  Cardiovascular:     Rate and Rhythm: Normal rate and regular rhythm.     Pulses: Normal pulses.     Heart sounds: Murmur present. No friction rub. No gallop.      Comments: Systolic murmur appreciated during auscultation. Pulmonary:     Effort: Pulmonary effort is normal.     Breath sounds: Normal breath sounds. No wheezing, rhonchi or rales.  Abdominal:     General: Bowel sounds are normal.     Palpations: Abdomen is soft.     Tenderness: There is no abdominal tenderness. There is no guarding.  Musculoskeletal:        General: No swelling.     Right lower leg: No edema.     Left lower leg: No edema.  Neurological:     General: No focal deficit present.     Mental Status: He is alert and oriented to person, place, and time.  Psychiatric:        Mood and Affect: Mood normal.        Behavior: Behavior normal.    CBC    Component Value Date/Time   WBC 8.9 11/07/2019 0500   RBC 3.69 (L)  11/07/2019 0500   HGB 12.7 (L) 11/07/2019 0500   HCT 37.4 (L) 11/07/2019 0500   PLT 177 11/07/2019 0500   MCV 101.4 (H) 11/07/2019 0500   MCH 34.4 (H) 11/07/2019 0500   MCHC 34.0 11/07/2019 0500   RDW 13.9 11/07/2019 0500     BMP Latest Ref Rng & Units 11/07/2019 11/06/2019  Glucose 70 - 99 mg/dL 158(H) 100(H)  BUN 8 - 23 mg/dL 16 14  Creatinine 0.61 - 1.24 mg/dL 0.79 0.97  Sodium 135 - 145 mmol/L 137 140  Potassium 3.5 - 5.1 mmol/L 4.0 4.0  Chloride 98 - 111 mmol/L 105 106  CO2 22 - 32 mmol/L 23 24  Calcium 8.9 - 10.3 mg/dL 8.8(L) 9.3   Assessment/Plan:  Active Problems:   Pulmonary embolism [facility])   [name] is 78 y/o male, with a PMH of COPD, DM, and HTN, who presented to the [facility] with segmental and subsegmental PE.   Assessment:    Segmental and Subsegmental Pulmonary Emboli:  - Switched heparin over  to Eliquis - Continue monitoring vitals - Continue Telemetry  Systolic Heart Murmur:  Systolic murmur appreciated on physical examination, given patient's age, likely aortic stenosis, but echocardiogram will be ordered for assessment.  - Echocardiogram ordered.  COPD:  - Continue Breo Ellipta  1 puff QD - Continue Albuterol 1 puff q6H PRN - Continue to monitor vitals   Gout:  - Continue home allopurinol 300 mg QD   Hypertension:  - Continue Lisinopril 40 mg QD.    OSA:  - Continue CPAP QHS PRN.   DM:  - Continue Metformin 500 mg BID. - BMP ordered  Diet: Low sodium  VTE ppx: Lovenox CODE STATUS: FULL  Dispo: Anticipated discharge in approximately pending course.   Maudie Mercury, MD IMTS, PGY-1 Pager: [phone] 11/07/2019,6:02 AM

## 2019-11-13 ENCOUNTER — Emergency Department (HOSPITAL_COMMUNITY)
Admission: EM | Admit: 2019-11-13 | Discharge: 2019-11-13 | Disposition: A | Discharge status: Home / Self Care | Payer: Medicare PPO | Attending: Emergency Medicine | Admitting: Emergency Medicine

## 2019-11-13 ENCOUNTER — Other Ambulatory Visit: Payer: Self-pay

## 2019-11-13 ENCOUNTER — Encounter (HOSPITAL_COMMUNITY): Payer: Self-pay | Admitting: Emergency Medicine

## 2019-11-13 ENCOUNTER — Emergency Department (HOSPITAL_COMMUNITY): Payer: Medicare PPO

## 2019-11-13 DIAGNOSIS — Z87891 Personal history of nicotine dependence: Secondary | ICD-10-CM | POA: Insufficient documentation

## 2019-11-13 DIAGNOSIS — Z7901 Long term (current) use of anticoagulants: Secondary | ICD-10-CM | POA: Diagnosis not present

## 2019-11-13 DIAGNOSIS — E119 Type 2 diabetes mellitus without complications: Secondary | ICD-10-CM | POA: Diagnosis not present

## 2019-11-13 DIAGNOSIS — J441 Chronic obstructive pulmonary disease with (acute) exacerbation: Secondary | ICD-10-CM | POA: Diagnosis not present

## 2019-11-13 DIAGNOSIS — Z20828 Contact with and (suspected) exposure to other viral communicable diseases: Secondary | ICD-10-CM | POA: Diagnosis not present

## 2019-11-13 DIAGNOSIS — I10 Essential (primary) hypertension: Secondary | ICD-10-CM | POA: Insufficient documentation

## 2019-11-13 DIAGNOSIS — R52 Pain, unspecified: Secondary | ICD-10-CM

## 2019-11-13 DIAGNOSIS — Z7984 Long term (current) use of oral hypoglycemic drugs: Secondary | ICD-10-CM | POA: Diagnosis not present

## 2019-11-13 DIAGNOSIS — Z79899 Other long term (current) drug therapy: Secondary | ICD-10-CM | POA: Insufficient documentation

## 2019-11-13 DIAGNOSIS — R0602 Shortness of breath: Secondary | ICD-10-CM | POA: Diagnosis present

## 2019-11-13 LAB — BASIC METABOLIC PANEL
Anion gap: 7 (ref 5–15)
BUN: 12 mg/dL (ref 8–23)
CO2: 25 mmol/L (ref 22–32)
Calcium: 9.4 mg/dL (ref 8.9–10.3)
Chloride: 105 mmol/L (ref 98–111)
Creatinine, Ser: 0.83 mg/dL (ref 0.61–1.24)
GFR calc Af Amer: >60 mL/min (ref >60)
GFR calc non Af Amer: >60 mL/min (ref >60)
Glucose, Bld: 147 mg/dL — ABNORMAL HIGH (ref 70–99)
Potassium: 4.1 mmol/L (ref 3.5–5.1)
Sodium: 137 mmol/L (ref 135–145)

## 2019-11-13 LAB — POC SARS CORONAVIRUS 2 AG -  ED: SARS Coronavirus 2 Ag: NEGATIVE

## 2019-11-13 LAB — CBC
HCT: 42.3 % (ref 39.0–52.0)
Hemoglobin: 14.3 g/dL (ref 13.0–17.0)
MCH: 35 pg — ABNORMAL HIGH (ref 26.0–34.0)
MCHC: 33.8 g/dL (ref 30.0–36.0)
MCV: 103.4 fL — ABNORMAL HIGH (ref 80.0–100.0)
Platelets: 230 10*3/uL (ref 150–400)
RBC: 4.09 MIL/uL — ABNORMAL LOW (ref 4.22–5.81)
RDW: 13.8 % (ref 11.5–15.5)
WBC: 6.5 10*3/uL (ref 4.0–10.5)
nRBC: 0 % (ref 0.0–0.2)

## 2019-11-13 LAB — TROPONIN I (HIGH SENSITIVITY)
Troponin I (High Sensitivity): 4 ng/L (ref <18)
Troponin I (High Sensitivity): 4 ng/L (ref <18)

## 2019-11-13 MED ORDER — ALBUTEROL SULFATE HFA 108 (90 BASE) MCG/ACT IN AERS
8.0000 | INHALATION_SPRAY | Freq: Once | RESPIRATORY_TRACT | Status: AC
  Administered 2019-11-13: 8 via RESPIRATORY_TRACT
  Filled 2019-11-13: qty 6.7

## 2019-11-13 MED ORDER — IPRATROPIUM-ALBUTEROL 0.5-2.5 (3) MG/3ML IN SOLN: 3.0000 mL | Freq: Once | RESPIRATORY_TRACT | Status: DC

## 2019-11-13 MED ORDER — IPRATROPIUM BROMIDE HFA 17 MCG/ACT IN AERS
2.0000 | INHALATION_SPRAY | Freq: Once | RESPIRATORY_TRACT | Status: AC
  Administered 2019-11-13: 2 via RESPIRATORY_TRACT
  Filled 2019-11-13: qty 12.9

## 2019-11-13 MED ORDER — MAGNESIUM SULFATE 2 GM/50ML IV SOLN
2.0000 g | Freq: Once | INTRAVENOUS | Status: AC
  Administered 2019-11-13: 2 g via INTRAVENOUS
  Filled 2019-11-13: qty 50

## 2019-11-13 MED ORDER — PREDNISONE 20 MG PO TABS
60.0000 mg | ORAL_TABLET | Freq: Once | ORAL | Status: AC
  Administered 2019-11-13: 60 mg via ORAL
  Filled 2019-11-13: qty 3

## 2019-11-13 MED ORDER — SODIUM CHLORIDE 0.9% FLUSH
3.0000 mL | Freq: Once | INTRAVENOUS | Status: AC
  Administered 2019-11-13: 3 mL via INTRAVENOUS

## 2019-11-13 MED ORDER — PREDNISONE 20 MG PO TABS: 40.0000 mg | ORAL_TABLET | Freq: Every day | ORAL | 0 refills | Status: DC

## 2019-11-13 NOTE — ED Provider Notes (Signed)
[place] EMERGENCY DEPARTMENT Provider Note   CSN: [id number] Arrival date & time: 11/13/19  1144     History Chief Complaint  Patient presents with  . Shortness of Breath    Amedio Martines is a 78 y.o. male with history of COPD, hypertension, diabetes, and recent diagnosis of PE on Eliquis presents with shortness of breath.  He states that after he was discharged from the hospital on December 20 he felt relatively well.  Last night he had gradually worsening shortness of breath.  He reports associated wheezing, coughing productive of white sputum.  He feels very short of breath with any exertion so he decided to come to the ED today.  He reports compliance with his Eliquis.  He denies syncope, chest pain, fever.  He reports getting temporary relief with his home inhalers.  He had a negative Covid test on December 19. He is a former smoker.  HPI     Past Medical History:  Diagnosis Date  . COPD (chronic obstructive pulmonary disease) ([place])   . Diabetes mellitus without complication ([place])   . Hypertension     Patient Active Problem List   Diagnosis Date Noted  . Cardiac murmur 11/07/2019  . COPD (chronic obstructive pulmonary disease) ([place]) 11/07/2019  . Diabetes mellitus ([place]) 11/07/2019  . 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 Essential hypertension 11/07/2019  . Pulmonary embolism ([place]) 11/06/2019    History reviewed. No pertinent surgical history.     No family history on file.  Social History   Tobacco Use  . Smoking status: Former Research scientist (life sciences)  . Smokeless tobacco: Former Network engineer Use Topics  . Alcohol use: Yes    Comment: Daily 2 shots of vodka  . Drug use: Never    Home Medications Prior to Admission medications   Medication Sig Start Date End Date Taking? Authorizing Provider  albuterol (VENTOLIN HFA) 108 (90 Base) MCG/ACT inhaler Inhale 1 puff into the lungs every 6 (six) hours as needed for wheezing or shortness of  breath.    [provider]  allopurinol (ZYLOPRIM) 300 MG tablet Take 300 mg by mouth daily.    [provider]  apixaban (ELIQUIS) 5 MG TABS tablet Take 2 tablets (10 mg total) by mouth 2 (two) times daily for 6 days. 11/07/19 11/13/19  Jean Rosenthal, MD  apixaban (ELIQUIS) 5 MG TABS tablet Take 1 tablet (5 mg total) by mouth 2 (two) times daily. START 5mg  BID by mouth ON 11/20/2018 11/21/19   Jean Rosenthal, MD  budesonide-formoterol (SYMBICORT) 160-4.5 MCG/ACT inhaler Inhale 2 puffs into the lungs every morning.    [provider]  lisinopril (ZESTRIL) 40 MG tablet Take 40 mg by mouth daily.    [provider]  metFORMIN (GLUCOPHAGE) 500 MG tablet Take 500 mg by mouth 2 (two) times daily with a meal.    [provider]    Allergies    Other  Review of Systems   Review of Systems  Constitutional: Negative for chills and fever.  Respiratory: Positive for cough, shortness of breath and wheezing.   Cardiovascular: Negative for chest pain.  Gastrointestinal: Negative for abdominal pain.  Neurological: Negative for syncope and light-headedness.  Hematological: Bruises/bleeds easily.  All other systems reviewed and are negative.   Physical Exam Updated Vital Signs BP (!) 153/72 (BP Location: Right Arm)   Pulse 90   Temp (!) 97.4 F (36.3 C) (Oral)   Resp 20   SpO2  99%   Physical Exam Vitals and nursing note reviewed.  Constitutional:      General: He is not in acute distress.    Appearance: He is well-developed. He is obese. He is not ill-appearing.     Comments: Calm, cooperative. Mild respiratory distress with audible wheezing  HENT:     Head: Normocephalic and atraumatic.  Eyes:     General: No scleral icterus.       Right eye: No discharge.        Left eye: No discharge.     Conjunctiva/sclera: Conjunctivae normal.     Pupils: Pupils are equal, round, and reactive to light.  Cardiovascular:     Rate and Rhythm: Normal rate and  regular rhythm.  Pulmonary:     Effort: Tachypnea and respiratory distress present.     Breath sounds: Decreased breath sounds and wheezing (diffuse) present.  Abdominal:     General: There is no distension.  Musculoskeletal:     Cervical back: Normal range of motion.  Skin:    General: Skin is warm and dry.  Neurological:     Mental Status: He is alert and oriented to person, place, and time.  Psychiatric:        Behavior: Behavior normal.     ED Results / Procedures / Treatments   Labs (all labs ordered are listed, but only abnormal results are displayed) Labs Reviewed  BASIC METABOLIC PANEL - Abnormal; Notable for the following components:      Result Value   Glucose, Bld 147 (*)    All other components within normal limits  CBC - Abnormal; Notable for the following components:   RBC 4.09 (*)    MCV 103.4 (*)    MCH 35.0 (*)    All other components within normal limits  POC SARS CORONAVIRUS 2 AG -  ED  TROPONIN I (HIGH SENSITIVITY)  TROPONIN I (HIGH SENSITIVITY)    EKG EKG Interpretation  Date/Time:  Saturday November 13 2019 12:18:59 EST Ventricular Rate:  83 PR Interval:  144 QRS Duration: 82 QT Interval:  360 QTC Calculation: 423 R Axis:   66 Text Interpretation: Normal sinus rhythm Normal ECG Confirmed by Madalyn Rob [phone]) on 11/13/2019 2:33:09 PM   Radiology DG Chest 1 View  Result Date: 11/13/2019 CLINICAL DATA:  Shortness of breath for 10 days, negative COVID-19 test when seen at hospital 1 week ago, history COPD, hypertension, diabetes mellitus, former smoker EXAM: CHEST  1 VIEW COMPARISON:  Portable exam of 11/06/2019 FINDINGS: Borderline enlargement of cardiac silhouette. Mediastinal contours and pulmonary vascularity normal. Atherosclerotic calcification aorta. Lungs clear. No pulmonary infiltrate, pleural effusion, or pneumothorax. Osseous structures unremarkable. IMPRESSION: No acute abnormalities. Aortic Atherosclerosis (ICD10-I70.0).  Electronically Signed   By: Lavonia Dana M.D.   On: 11/13/2019 13:03    Procedures Procedures (including critical care time)  Medications Ordered in ED Medications  ipratropium-albuterol (DUONEB) 0.5-2.5 (3) MG/3ML nebulizer solution 3 mL (3 mLs Nebulization Not Given 11/13/19 1730)  sodium chloride flush (NS) 0.9 % injection 3 mL (3 mLs Intravenous Given 11/13/19 1635)  predniSONE (DELTASONE) tablet 60 mg (60 mg Oral Given 11/13/19 1615)  magnesium sulfate IVPB 2 g 50 mL (0 g Intravenous Stopped 11/13/19 1736)  albuterol (VENTOLIN HFA) 108 (90 Base) MCG/ACT inhaler 8 puff (8 puffs Inhalation Given 11/13/19 1736)  ipratropium (ATROVENT HFA) inhaler 2 puff (2 puffs Inhalation Given 11/13/19 1737)    ED Course  I have reviewed the triage vital signs and the nursing  notes.  Pertinent labs & imaging results that were available during my care of the patient were reviewed by me and considered in my medical decision making (see chart for details).  78 year old male presents with SOB, cough, wheezing since last night. Likely COPD exacerbation. He has audible wheezing on exam. BP is elevated but otherwise vitals are normal. He reports strict compliance with his blood thinner and hasn't missed doses. Doubt worsening clot burden from recent PE. EKG is SR. CXR is negative. Labs are overall reassuring. Initial and 2nd trop are normal. Rapid POC COVID is negative. Shared visit with Dr. Roslynn Amble. Will give symptomatic meds  After inhalers, steroid, and mag pt feels better. He still has wheezing on exam. He ambulated and sats dropped to high 80s but he wasn't very symptomatic. He states he would like to go home. Will d/c with course of prednisone. He was given return precautions.  Constantinos Arballo was evaluated in Emergency Department on 11/13/2019 for the symptoms described in the history of present illness. He was evaluated in the context of the global COVID-19 pandemic, which necessitated consideration that the  patient might be at risk for infection with the SARS-CoV-2 virus that causes COVID-19. Institutional protocols and algorithms that pertain to the evaluation of patients at risk for COVID-19 are in a state of rapid change based on information released by regulatory bodies including the CDC and federal and state organizations. These policies and algorithms were followed during the patient's care in the ED.   MDM Rules/Calculators/A&P                      Final Clinical Impression(s) / ED Diagnoses Final diagnoses:  COPD exacerbation [facility])    Rx / DC Orders ED Discharge Orders    None       Recardo Evangelist, PA-C 11/13/19 1955    Lucrezia Starch, MD 11/14/19 [phone]

## 2019-11-13 NOTE — Discharge Instructions (Signed)
Use inhalers as needed for shortness of breath and wheezing Take Prednisone for the next 5 days Please return if you are worsening

## 2019-11-13 NOTE — ED Notes (Signed)
Pt ambulated on pulse ox.  Pt sounded like they were wheezing a little.   Pt's dropped sats to 88% briefly then rebounded to 93.

## 2019-11-13 NOTE — ED Triage Notes (Signed)
C/o SOB and non-productive cough since last night.  Negative COVID last week.

## 2019-11-29 NOTE — Discharge Summary (Addendum)
Name: Vincent Cooper MRN: [id number] DOB: 26-Mar-1941 79 y.o. PCP: [name], DO  Date of Admission: 11/06/2019 11:31 AM Date of Discharge: 11/07/2019 Attending Physician: Lenice Pressman, MD, PhD   Discharge Diagnosis: 1. Segmental and Subsegmental Pulmonary Emboli 2. Systolic Heart Murmur 3. COPD 4. Gout 5. Obstructive Sleep Apnea 6. Diabetes Melitis     Discharge Medications: Allergies as of 11/07/2019       Reactions   Other    Patient reports he was allergic to something in an IV he was given but does not know what the substance was.        Medication List     TAKE these medications    albuterol 108 (90 Base) MCG/ACT inhaler Commonly known as: VENTOLIN HFA Inhale 1 puff into the lungs every 6 (six) hours as needed for wheezing or shortness of breath.   allopurinol 300 MG tablet Commonly known as: ZYLOPRIM Take 300 mg by mouth daily.   apixaban 5 MG Tabs tablet Commonly known as: ELIQUIS Take 2 tablets (10 mg total) by mouth 2 (two) times daily for 6 days.   apixaban 5 MG Tabs tablet Commonly known as: ELIQUIS Take 1 tablet (5 mg total) by mouth 2 (two) times daily. START 5mg  BID by mouth ON 11/20/2018   budesonide-formoterol 160-4.5 MCG/ACT inhaler Commonly known as: SYMBICORT Inhale 2 puffs into the lungs every morning.   lisinopril 40 MG tablet Commonly known as: ZESTRIL Take 40 mg by mouth daily.   metFORMIN 500 MG tablet Commonly known as: GLUCOPHAGE Take 500 mg by mouth 2 (two) times daily with a meal.        Disposition and follow-up:   [name] was discharged from [facility] in Stable condition.  At the hospital follow up visit please address:  1.  Medication Compliance for PE   2.  Labs / imaging needed at time of follow-up: N/A  3.  Pending labs/ test needing follow-up: Echocardiogram results  Follow-up Appointments:    Hospital Course by problem list: 1. Segmental and Subsegmental Pulmonary  Emboli: Patient is a 79 y/o male, with a PMH of COPD, DM, HTN, who presented with SHOB to the [place]. His D-dimer was elevated and a CT angio chest revealed a segmental and subsegmental PE with an EKG that did not show R heart strain. He was initially started on heparin and placed in observation overnight for continuance of monitoring. Patient was transitioned from heparin to Eliquis. He was D/C in stable condition on Eliquis.   2. Systolic Heart Murmur: Systolic murmur noted on admission, he reported no knowledge of a heart murmur before. TTE showed LVEF 60-65%, mild dilation of LA and RV, moderately elevated PA pressure of 40 mmHg, small pericardial effusion, no clear source of murmur but AV and PV not well visualized.  3. COPD: Patient with a PMH of COPD. He was stable on admission. He was placed on Breo Ellipta and albuterol. He continued to be stable during his hospitilzation and was D/C in stable condition on his home medications.    4. Gout: Patient with a history of gout. He was placed on his home medication of allopurinol 300 mg QD. He was D/C in stable condition with his home medication.  5. Obstructive Sleep Apnea: Patient with a History of OSA. He endorsed having a CPAP machine at home that he uses nightly. Patient was given CPAP during his stay. Patient was D/C in stable condition.   6. Diabetes Mellitus: Patient with  a history of DM, on home medication of Metformin 500 mg BID. Patient was kept on his home dose of metformin. He was D/C home in stable condition with his home medication.   Discharge Vitals:   BP (!) 168/112 (BP Location: Right Arm)   Pulse 79   Temp 97.7 F (36.5 C) (Oral)   Resp 19   Ht 5\' 9"  (1.753 m)   Wt 117.9 kg   SpO2 98%   BMI 38.40 kg/m   Pertinent Labs, Studies, and Procedures:  CT Angio Chest PE W or WO CONTRAST: 11/06/19 IMPRESSION: 1. Slightly suboptimal opacification of the main pulmonary artery. 2. Partially occlusive thrombus in the posterior  right segmental and subsegmental pulmonary arterial branch and right middle lobe subsegmental branches. 3. No evidence of right ventricular heart strain. 4.  Aortic Atherosclerosis (ICD10-I70.0). 5. 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 6. These results were called by telephone at the time of interpretation on 11/06/2019 at 3:07 pm to provider [facility] RAY , who verbally acknowledged these results.    Ref Range & Units 3 wk ago  D-Dimer, Quant 0.00 - 0.50 ug/mL-FEU 5.89High     CBC Latest Ref Rng & Units 11/13/2019 11/07/2019 11/06/2019  WBC 4.0 - 10.5 K/uL 6.5 8.9 7.2  Hemoglobin 13.0 - 17.0 g/dL 14.3 12.7(L) 15.0  Hematocrit 39.0 - 52.0 % 42.3 37.4(L) 43.9  Platelets 150 - 400 K/uL 230 177 170    Discharge Instructions: Discharge Instructions     Diet - low sodium heart healthy   Complete by: As directed    Discharge instructions   Complete by: As directed    [name],   It was a pleasure taking care of you here in the hospital.  You were admitted because of blood clots in your lungs.  We started you on IV blood clot medicine and we switched you to an oral medicine called Eliquis.  Moving forward, you will take Eliquis 10 mg twice a day for 7 days and 5 mg twice a day afterwards.  I would like for you to follow-up with your primary doctor for routine checkup and any screening you would need for your age.  Also, I would like for you to make them aware that you were admitted to the hospital because of a blood clot.  Take care!   Increase activity slowly   Complete by: As directed        Signed: Maudie Mercury, MD 11/29/2019, 2:12 PM   Pager: [phone]

## 2019-12-04 ENCOUNTER — Other Ambulatory Visit: Payer: Self-pay | Admitting: Internal Medicine

## 2019-12-31 ENCOUNTER — Other Ambulatory Visit: Payer: Self-pay

## 2019-12-31 ENCOUNTER — Emergency Department (HOSPITAL_COMMUNITY)
Admission: EM | Admit: 2019-12-31 | Discharge: 2019-12-31 | Disposition: A | Discharge status: Home / Self Care | Payer: Medicare PPO | Attending: Emergency Medicine | Admitting: Emergency Medicine

## 2019-12-31 ENCOUNTER — Emergency Department (HOSPITAL_COMMUNITY): Payer: Medicare PPO

## 2019-12-31 DIAGNOSIS — Z79899 Other long term (current) drug therapy: Secondary | ICD-10-CM | POA: Diagnosis not present

## 2019-12-31 DIAGNOSIS — Z20822 Contact with and (suspected) exposure to covid-19: Secondary | ICD-10-CM | POA: Insufficient documentation

## 2019-12-31 DIAGNOSIS — Z7901 Long term (current) use of anticoagulants: Secondary | ICD-10-CM | POA: Diagnosis not present

## 2019-12-31 DIAGNOSIS — R0602 Shortness of breath: Secondary | ICD-10-CM | POA: Diagnosis present

## 2019-12-31 DIAGNOSIS — E119 Type 2 diabetes mellitus without complications: Secondary | ICD-10-CM | POA: Insufficient documentation

## 2019-12-31 DIAGNOSIS — J441 Chronic obstructive pulmonary disease with (acute) exacerbation: Secondary | ICD-10-CM | POA: Insufficient documentation

## 2019-12-31 DIAGNOSIS — Z7984 Long term (current) use of oral hypoglycemic drugs: Secondary | ICD-10-CM | POA: Insufficient documentation

## 2019-12-31 LAB — CBC WITH DIFFERENTIAL/PLATELET
Abs Immature Granulocytes: 0.03 10*3/uL (ref 0.00–0.07)
Basophils Absolute: 0.1 10*3/uL (ref 0.0–0.1)
Basophils Relative: 1 %
Eosinophils Absolute: 0.9 10*3/uL — ABNORMAL HIGH (ref 0.0–0.5)
Eosinophils Relative: 11 %
HCT: 43 % (ref 39.0–52.0)
Hemoglobin: 13.8 g/dL (ref 13.0–17.0)
Immature Granulocytes: 0 %
Lymphocytes Relative: 14 %
Lymphs Abs: 1.2 10*3/uL (ref 0.7–4.0)
MCH: 33.3 pg (ref 26.0–34.0)
MCHC: 32.1 g/dL (ref 30.0–36.0)
MCV: 103.9 fL — ABNORMAL HIGH (ref 80.0–100.0)
Monocytes Absolute: 0.8 10*3/uL (ref 0.1–1.0)
Monocytes Relative: 9 %
Neutro Abs: 5.6 10*3/uL (ref 1.7–7.7)
Neutrophils Relative %: 65 %
Platelets: 225 10*3/uL (ref 150–400)
RBC: 4.14 MIL/uL — ABNORMAL LOW (ref 4.22–5.81)
RDW: 14.4 % (ref 11.5–15.5)
WBC: 8.6 10*3/uL (ref 4.0–10.5)
nRBC: 0 % (ref 0.0–0.2)

## 2019-12-31 LAB — BASIC METABOLIC PANEL
Anion gap: 9 (ref 5–15)
BUN: 22 mg/dL (ref 8–23)
CO2: 25 mmol/L (ref 22–32)
Calcium: 9 mg/dL (ref 8.9–10.3)
Chloride: 105 mmol/L (ref 98–111)
Creatinine, Ser: 0.93 mg/dL (ref 0.61–1.24)
GFR calc Af Amer: >60 mL/min (ref >60)
GFR calc non Af Amer: >60 mL/min (ref >60)
Glucose, Bld: 168 mg/dL — ABNORMAL HIGH (ref 70–99)
Potassium: 4.1 mmol/L (ref 3.5–5.1)
Sodium: 139 mmol/L (ref 135–145)

## 2019-12-31 LAB — RESPIRATORY PANEL BY RT PCR (FLU A&B, COVID)
Influenza A by PCR: NEGATIVE
Influenza B by PCR: NEGATIVE
SARS Coronavirus 2 by RT PCR: NEGATIVE

## 2019-12-31 MED ORDER — IPRATROPIUM BROMIDE 0.02 % IN SOLN
0.5000 mg | Freq: Once | RESPIRATORY_TRACT | Status: AC
  Administered 2019-12-31: 0.5 mg via RESPIRATORY_TRACT
  Filled 2019-12-31: qty 2.5

## 2019-12-31 MED ORDER — PREDNISONE 20 MG PO TABS: 40.0000 mg | ORAL_TABLET | Freq: Every day | ORAL | 0 refills | Status: DC

## 2019-12-31 MED ORDER — DOXYCYCLINE HYCLATE 100 MG PO CAPS: 100.0000 mg | ORAL_CAPSULE | Freq: Two times a day (BID) | ORAL | 0 refills | Status: DC

## 2019-12-31 MED ORDER — ALBUTEROL (5 MG/ML) CONTINUOUS INHALATION SOLN
10.0000 mg/h | INHALATION_SOLUTION | Freq: Once | RESPIRATORY_TRACT | Status: AC
  Administered 2019-12-31: 10 mg/h via RESPIRATORY_TRACT
  Filled 2019-12-31: qty 20

## 2019-12-31 MED ORDER — METHYLPREDNISOLONE SODIUM SUCC 125 MG IJ SOLR
125.0000 mg | Freq: Once | INTRAMUSCULAR | Status: AC
  Administered 2019-12-31: 125 mg via INTRAVENOUS
  Filled 2019-12-31: qty 2

## 2019-12-31 NOTE — ED Provider Notes (Signed)
[facility] Provider Note   CSN: [id number] Arrival date & time: 12/31/19  0054     History Chief Complaint  Patient presents with  . Shortness of Breath    Vincent Cooper is a 79 y.o. male.  Patient presents to the emergency department for shortness of breath.  Patient reports that he started having increased wheezing and shortness of breath yesterday evening.  He has been using his inhaler of albuterol and Atrovent as well as nebulizers with increased frequency today.  Initially they seem to help but tonight he woke up very short of breath.  He arrives from home by EMS.        Past Medical History:  Diagnosis Date  . COPD (chronic obstructive pulmonary disease) ([place])   . Diabetes mellitus without complication ([place])   . Hypertension     Patient Active Problem List   Diagnosis Date Noted  . Cardiac murmur 11/07/2019  . COPD (chronic obstructive pulmonary disease) ([place]) 11/07/2019  . Diabetes mellitus ([place]) 11/07/2019  . OSA and COPD overlap syndrome ([place]) 11/07/2019  . Essential hypertension 11/07/2019  . Pulmonary embolism ([place]) 11/06/2019    No past surgical history on file.     No family history on file.  Social History   Tobacco Use  . Smoking status: Former Research scientist (life sciences)  . Smokeless tobacco: Former Network engineer Use Topics  . Alcohol use: Yes    Comment: Daily 2 shots of vodka  . Drug use: Never    Home Medications Prior to Admission medications   Medication Sig Start Date End Date Taking? Authorizing Provider  albuterol (VENTOLIN HFA) 108 (90 Base) MCG/ACT inhaler Inhale 1 puff into the lungs every 6 (six) hours as needed for wheezing or shortness of breath.    [provider]  allopurinol (ZYLOPRIM) 300 MG tablet Take 300 mg by mouth daily.    [provider]  apixaban (ELIQUIS) 5 MG TABS tablet Take 2 tablets (10 mg total) by mouth 2 (two) times daily for 6 days. 11/07/19 11/13/19  Jean Rosenthal, MD  apixaban  (ELIQUIS) 5 MG TABS tablet Take 1 tablet (5 mg total) by mouth 2 (two) times daily. START 5mg  BID by mouth ON 11/20/2018 11/21/19   Jean Rosenthal, MD  budesonide-formoterol (SYMBICORT) 160-4.5 MCG/ACT inhaler Inhale 2 puffs into the lungs every morning.    [provider]  doxycycline (VIBRAMYCIN) 100 MG capsule Take 1 capsule (100 mg total) by mouth 2 (two) times daily. 12/31/19   Orpah Greek, MD  lisinopril (ZESTRIL) 40 MG tablet Take 40 mg by mouth daily.    [provider]  metFORMIN (GLUCOPHAGE) 500 MG tablet Take 500 mg by mouth 2 (two) times daily with a meal.    [provider]  predniSONE (DELTASONE) 20 MG tablet Take 2 tablets (40 mg total) by mouth daily with breakfast. 12/31/19   Merick Kelleher, Gwenyth Allegra, MD    Allergies    Other  Review of Systems   Review of Systems  Constitutional: Negative for fever.  Respiratory: Positive for shortness of breath. Negative for cough.   Cardiovascular: Negative for chest pain and leg swelling.  All other systems reviewed and are negative.   Physical Exam Updated Vital Signs BP (!) 161/66   Pulse 94   Temp 98.6 F (37 C)   Resp 16   Ht 5\' 9"  (1.753 m)   Wt 117.9 kg   SpO2 100%   BMI 38.40 kg/m   Physical  Exam Vitals and nursing note reviewed.  Constitutional:      General: He is not in acute distress.    Appearance: Normal appearance. He is well-developed.  HENT:     Head: Normocephalic and atraumatic.     Right Ear: Hearing normal.     Left Ear: Hearing normal.     Nose: Nose normal.  Eyes:     Conjunctiva/sclera: Conjunctivae normal.     Pupils: Pupils are equal, round, and reactive to light.  Cardiovascular:     Rate and Rhythm: Regular rhythm.     Heart sounds: S1 normal and S2 normal. No murmur. No friction rub. No gallop.   Pulmonary:     Effort: Tachypnea and accessory muscle usage present. No respiratory distress.     Breath sounds: Decreased breath sounds and wheezing present.    Chest:     Chest wall: No tenderness.  Abdominal:     General: Bowel sounds are normal.     Palpations: Abdomen is soft.     Tenderness: There is no abdominal tenderness. There is no guarding or rebound. Negative signs include Murphy's sign and McBurney's sign.     Hernia: No hernia is present.  Musculoskeletal:        General: Normal range of motion.     Cervical back: Normal range of motion and neck supple.  Skin:    General: Skin is warm and dry.     Findings: No rash.  Neurological:     Mental Status: He is alert and oriented to person, place, and time.     GCS: GCS eye subscore is 4. GCS verbal subscore is 5. GCS motor subscore is 6.     Cranial Nerves: No cranial nerve deficit.     Sensory: No sensory deficit.     Coordination: Coordination normal.  Psychiatric:        Speech: Speech normal.        Behavior: Behavior normal.        Thought Content: Thought content normal.     ED Results / Procedures / Treatments   Labs (all labs ordered are listed, but only abnormal results are displayed) Labs Reviewed  CBC WITH DIFFERENTIAL/PLATELET - Abnormal; Notable for the following components:      Result Value   RBC 4.14 (*)    MCV 103.9 (*)    Eosinophils Absolute 0.9 (*)    All other components within normal limits  BASIC METABOLIC PANEL - Abnormal; Notable for the following components:   Glucose, Bld 168 (*)    All other components within normal limits  RESPIRATORY PANEL BY RT PCR (FLU A&B, COVID)    EKG EKG Interpretation  Date/Time:  Friday December 31 2019 01:01:22 EST Ventricular Rate:  110 PR Interval:    QRS Duration: 107 QT Interval:  359 QTC Calculation: 486 R Axis:   68 Text Interpretation: Sinus tachycardia Low voltage, precordial leads Borderline prolonged QT interval Confirmed by Orpah Greek [phone]) on 12/31/2019 1:15:42 AM   Radiology DG Chest Portable 1 View  Result Date: 12/31/2019 CLINICAL DATA:  Shortness of breath EXAM: PORTABLE  CHEST 1 VIEW COMPARISON:  November 13, 2019 FINDINGS: The heart size and mediastinal contours are unchanged with mild cardiomegaly. Aortic knob calcifications. There is prominence of the central pulmonary vasculature. No large airspace consolidation or pleural effusion. No acute osseous abnormality. IMPRESSION: Stable cardiomegaly and pulmonary vascular congestion. Electronically Signed   By: Prudencio Pair M.D.   On: 12/31/2019 01:31  Procedures Procedures (including critical care time)  Medications Ordered in ED Medications  methylPREDNISolone sodium succinate (SOLU-MEDROL) 125 mg/2 mL injection 125 mg (125 mg Intravenous Given 12/31/19 0245)  albuterol (PROVENTIL,VENTOLIN) solution continuous neb (10 mg/hr Nebulization Given 12/31/19 0240)  ipratropium (ATROVENT) nebulizer solution 0.5 mg (0.5 mg Nebulization Given 12/31/19 0240)    ED Course  I have reviewed the triage vital signs and the nursing notes.  Pertinent labs & imaging results that were available during my care of the patient were reviewed by me and considered in my medical decision making (see chart for details).    MDM Rules/Calculators/A&P                      Patient presents to the emergency department for evaluation of shortness of breath.  Patient has been experiencing increased wheezing and shortness of breath for 1 day.  At arrival he had significant bronchospasm with tachypnea and decreased air movement.  This has completely resolved with continuous albuterol nebulizer treatment and Solu-Medrol.  Chest x-ray does not show pneumonia.  Influenza A/B and Covid negative.  No evidence of volume overload.  Patient has been off oxygen since he received his continuous nebulizer treatment and room air oxygen saturation is 95%.  Repeat lung examination reveals no wheezing with good air movement.  He reports that he feels much improved.  Patient will therefore be appropriate for discharge with outpatient management of COPD  exacerbation. Final Clinical Impression(s) / ED Diagnoses Final diagnoses:  COPD exacerbation ([place])    Rx / DC Orders ED Discharge Orders         Ordered    predniSONE (DELTASONE) 20 MG tablet  Daily with breakfast     12/31/19 0340    doxycycline (VIBRAMYCIN) 100 MG capsule  2 times daily     12/31/19 0340           Orpah Greek, MD 12/31/19 [phone]

## 2019-12-31 NOTE — ED Notes (Signed)
Pt d/c to lobby

## 2019-12-31 NOTE — ED Triage Notes (Signed)
Pt calls 911 for SOB. Since tonight when he woke up. Recent seen for PE. +wheezing +SOB  repeittive uses of his inhaler. Tachy with ems.  20r ac.

## 2020-01-06 ENCOUNTER — Emergency Department (HOSPITAL_COMMUNITY)
Admission: EM | Admit: 2020-01-06 | Discharge: 2020-01-06 | Disposition: A | Discharge status: Home / Self Care | Payer: Medicare PPO | Attending: Emergency Medicine | Admitting: Emergency Medicine

## 2020-01-06 ENCOUNTER — Emergency Department (HOSPITAL_COMMUNITY): Payer: Medicare PPO

## 2020-01-06 ENCOUNTER — Encounter (HOSPITAL_COMMUNITY): Payer: Self-pay | Admitting: Emergency Medicine

## 2020-01-06 ENCOUNTER — Other Ambulatory Visit: Payer: Self-pay

## 2020-01-06 DIAGNOSIS — E119 Type 2 diabetes mellitus without complications: Secondary | ICD-10-CM | POA: Diagnosis not present

## 2020-01-06 DIAGNOSIS — Z87891 Personal history of nicotine dependence: Secondary | ICD-10-CM | POA: Diagnosis not present

## 2020-01-06 DIAGNOSIS — Z79899 Other long term (current) drug therapy: Secondary | ICD-10-CM | POA: Insufficient documentation

## 2020-01-06 DIAGNOSIS — I10 Essential (primary) hypertension: Secondary | ICD-10-CM | POA: Insufficient documentation

## 2020-01-06 DIAGNOSIS — J441 Chronic obstructive pulmonary disease with (acute) exacerbation: Secondary | ICD-10-CM | POA: Diagnosis not present

## 2020-01-06 DIAGNOSIS — Z20822 Contact with and (suspected) exposure to covid-19: Secondary | ICD-10-CM | POA: Diagnosis not present

## 2020-01-06 DIAGNOSIS — R0602 Shortness of breath: Secondary | ICD-10-CM | POA: Diagnosis present

## 2020-01-06 DIAGNOSIS — Z7901 Long term (current) use of anticoagulants: Secondary | ICD-10-CM | POA: Insufficient documentation

## 2020-01-06 DIAGNOSIS — Z7984 Long term (current) use of oral hypoglycemic drugs: Secondary | ICD-10-CM | POA: Diagnosis not present

## 2020-01-06 LAB — BASIC METABOLIC PANEL
Anion gap: 10 (ref 5–15)
BUN: 18 mg/dL (ref 8–23)
CO2: 26 mmol/L (ref 22–32)
Calcium: 9.2 mg/dL (ref 8.9–10.3)
Chloride: 101 mmol/L (ref 98–111)
Creatinine, Ser: 0.92 mg/dL (ref 0.61–1.24)
GFR calc Af Amer: >60 mL/min (ref >60)
GFR calc non Af Amer: >60 mL/min (ref >60)
Glucose, Bld: 117 mg/dL — ABNORMAL HIGH (ref 70–99)
Potassium: 4.2 mmol/L (ref 3.5–5.1)
Sodium: 137 mmol/L (ref 135–145)

## 2020-01-06 LAB — CBC WITH DIFFERENTIAL/PLATELET
Abs Immature Granulocytes: 0.15 K/uL — ABNORMAL HIGH (ref 0.00–0.07)
Basophils Absolute: 0.1 K/uL (ref 0.0–0.1)
Basophils Relative: 1 %
Eosinophils Absolute: 1 K/uL — ABNORMAL HIGH (ref 0.0–0.5)
Eosinophils Relative: 8 %
HCT: 45 % (ref 39.0–52.0)
Hemoglobin: 15 g/dL (ref 13.0–17.0)
Immature Granulocytes: 1 %
Lymphocytes Relative: 16 %
Lymphs Abs: 1.9 K/uL (ref 0.7–4.0)
MCH: 33.9 pg (ref 26.0–34.0)
MCHC: 33.3 g/dL (ref 30.0–36.0)
MCV: 101.6 fL — ABNORMAL HIGH (ref 80.0–100.0)
Monocytes Absolute: 0.8 K/uL (ref 0.1–1.0)
Monocytes Relative: 7 %
Neutro Abs: 7.9 K/uL — ABNORMAL HIGH (ref 1.7–7.7)
Neutrophils Relative %: 67 %
Platelets: 229 K/uL (ref 150–400)
RBC: 4.43 MIL/uL (ref 4.22–5.81)
RDW: 14.3 % (ref 11.5–15.5)
WBC: 11.7 K/uL — ABNORMAL HIGH (ref 4.0–10.5)
nRBC: 0 % (ref 0.0–0.2)

## 2020-01-06 LAB — TROPONIN I (HIGH SENSITIVITY): Troponin I (High Sensitivity): 6 ng/L

## 2020-01-06 LAB — POC SARS CORONAVIRUS 2 AG -  ED: SARS Coronavirus 2 Ag: NEGATIVE

## 2020-01-06 MED ORDER — ALBUTEROL (5 MG/ML) CONTINUOUS INHALATION SOLN
10.0000 mg/h | INHALATION_SOLUTION | Freq: Once | RESPIRATORY_TRACT | Status: AC
  Administered 2020-01-06: 10 mg/h via RESPIRATORY_TRACT
  Filled 2020-01-06: qty 20

## 2020-01-06 MED ORDER — PREDNISONE 10 MG (21) PO TBPK: ORAL_TABLET | Freq: Every day | ORAL | 0 refills | Status: DC

## 2020-01-06 MED ORDER — METHYLPREDNISOLONE SODIUM SUCC 125 MG IJ SOLR
125.0000 mg | Freq: Once | INTRAMUSCULAR | Status: AC
  Administered 2020-01-06: 125 mg via INTRAVENOUS
  Filled 2020-01-06: qty 2

## 2020-01-06 MED ORDER — IPRATROPIUM BROMIDE 0.02 % IN SOLN
0.5000 mg | Freq: Once | RESPIRATORY_TRACT | Status: AC
  Administered 2020-01-06: 20:00:00 0.5 mg via RESPIRATORY_TRACT
  Filled 2020-01-06: qty 2.5

## 2020-01-06 NOTE — ED Triage Notes (Signed)
Pt from home via [facility]. EMS. C/O sob x1 week worse over last few days. Seen last week for same. Hx of COPD. Audible wheezing noted, productive cough with white phlegm present. Per EMS O2 sats on arrival to house was 89% at home. Pt has used rescue inhaler and reports some relief.

## 2020-01-06 NOTE — Discharge Instructions (Signed)
Continue your nebulizer treatments at home for your COPD.  Take steroids as prescribed.  Please call primary doctor for close follow-up appointment regarding your symptoms today.  Return to ER for any worsening in your breathing, any chest pain, fever or other new concerning symptom.

## 2020-01-06 NOTE — ED Notes (Signed)
Pt ambulated to bathroom on room air. Upon return to room, O2 sats at 91-92%. After sitting in room approx. 2-3 minutes sats returned to 96-97% on Room Air. Dr. Roslynn Amble made aware.

## 2020-01-06 NOTE — ED Provider Notes (Signed)
[facility] EMERGENCY DEPARTMENT Provider Note   CSN: [id number] Arrival date & time: 01/06/20  [place]     History Chief Complaint  Patient presents with  . Shortness of Breath    Vincent Cooper is a 79 y.o. male.  Presents emerged from chief complaint shortness of breath.  Patient states symptoms started approximately 3 3 days ago.  Similar to presentation from about a week ago.  Worse with exertion, no associated chest pain.  Noted significant improvement with his at home nebulized treatments however seem to be worsening throughout the evening this evening.  States he completed the course of steroids that was previously prescribed.  No fever, has had intermittent cough, some white phlegm, no blood.  No known exposures to COVID-19.  HPI     Past Medical History:  Diagnosis Date  . COPD (chronic obstructive pulmonary disease) ([place])   . Diabetes mellitus without complication ([place])   . Hypertension     Patient Active Problem List   Diagnosis Date Noted  . Cardiac murmur 11/07/2019  . COPD (chronic obstructive pulmonary disease) ([place]) 11/07/2019  . Diabetes mellitus ([place]) 11/07/2019  . 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 Essential hypertension 11/07/2019  . Pulmonary embolism ([place]) 11/06/2019    History reviewed. No pertinent surgical history.     History reviewed. No pertinent family history.  Social History   Tobacco Use  . Smoking status: Former Research scientist (life sciences)  . Smokeless tobacco: Former Network engineer Use Topics  . Alcohol use: Yes    Comment: Daily 2 shots of vodka  . Drug use: Never    Home Medications Prior to Admission medications   Medication Sig Start Date End Date Taking? Authorizing Provider  albuterol (VENTOLIN HFA) 108 (90 Base) MCG/ACT inhaler Inhale 1 puff into the lungs every 6 (six) hours as needed for wheezing or shortness of breath.    [provider]  allopurinol (ZYLOPRIM) 300 MG tablet Take 300 mg by mouth daily.    [provider]  apixaban (ELIQUIS) 5 MG TABS tablet Take 2 tablets (10 mg total) by mouth 2 (two) times daily for 6 days. 11/07/19 11/13/19  Jean Rosenthal, MD  apixaban (ELIQUIS) 5 MG TABS tablet Take 1 tablet (5 mg total) by mouth 2 (two) times daily. START 5mg  BID by mouth ON 11/20/2018 11/21/19   Jean Rosenthal, MD  budesonide-formoterol (SYMBICORT) 160-4.5 MCG/ACT inhaler Inhale 2 puffs into the lungs every morning.    [provider]  doxycycline (VIBRAMYCIN) 100 MG capsule Take 1 capsule (100 mg total) by mouth 2 (two) times daily. 12/31/19   Orpah Greek, MD  lisinopril (ZESTRIL) 40 MG tablet Take 40 mg by mouth daily.    [provider]  metFORMIN (GLUCOPHAGE) 500 MG tablet Take 500 mg by mouth 2 (two) times daily with a meal.    [provider]  predniSONE (DELTASONE) 20 MG tablet Take 2 tablets (40 mg total) by mouth daily with breakfast. 12/31/19   Pollina, Gwenyth Allegra, MD  predniSONE (STERAPRED UNI-PAK 21 TAB) 10 MG (21) TBPK tablet Take by mouth daily. Take 6 tabs by mouth daily  for 1 day, then 5 tabs for 1 day, then 4 tabs for 1 day, then 3 tabs for 1 day, 2 tabs for 1 day, then 1 tab by mouth daily for 1 day 01/06/20   Lucrezia Starch, MD    Allergies    Other  Review of Systems   Review of  Systems  Constitutional: Negative for chills and fever.  HENT: Negative for ear pain and sore throat.   Eyes: Negative for pain and visual disturbance.  Respiratory: Positive for cough and shortness of breath.   Cardiovascular: Negative for chest pain and palpitations.  Gastrointestinal: Negative for abdominal pain and vomiting.  Genitourinary: Negative for dysuria and hematuria.  Musculoskeletal: Negative for arthralgias and back pain.  Skin: Negative for color change and rash.  Neurological: Negative for seizures and syncope.  All other systems reviewed and are negative.   Physical Exam Updated Vital Signs BP 135/63   Pulse (!) 118   Temp 98.1 F  (36.7 C) (Oral)   Resp (!) 21   Ht 5\' 9"  (1.753 m)   Wt 117.9 kg   SpO2 93%   BMI 38.40 kg/m   Physical Exam Vitals and nursing note reviewed.  Constitutional:      Appearance: He is well-developed.     Comments: Well-appearing, speaking full sentences, no distress  HENT:     Head: Normocephalic and atraumatic.  Eyes:     Conjunctiva/sclera: Conjunctivae normal.  Cardiovascular:     Rate and Rhythm: Normal rate and regular rhythm.     Heart sounds: No murmur.  Pulmonary:     Comments: Speaking full sentences, no respiratory distress, bilateral expiratory wheezing noted throughout all lung fields Abdominal:     Palpations: Abdomen is soft.     Tenderness: There is no abdominal tenderness.  Musculoskeletal:     Cervical back: Neck supple.  Skin:    General: Skin is warm and dry.     Capillary Refill: Capillary refill takes less than 2 seconds.  Neurological:     Mental Status: He is alert.     ED Results / Procedures / Treatments   Labs (all labs ordered are listed, but only abnormal results are displayed) Labs Reviewed  CBC WITH DIFFERENTIAL/PLATELET - Abnormal; Notable for the following components:      Result Value   WBC 11.7 (*)    MCV 101.6 (*)    Neutro Abs 7.9 (*)    Eosinophils Absolute 1.0 (*)    Abs Immature Granulocytes 0.15 (*)    All other components within normal limits  BASIC METABOLIC PANEL - Abnormal; Notable for the following components:   Glucose, Bld 117 (*)    All other components within normal limits  POC SARS CORONAVIRUS 2 AG -  ED  POC SARS CORONAVIRUS 2 AG -  ED  TROPONIN I (HIGH SENSITIVITY)    EKG EKG Interpretation  Date/Time:  Thursday January 06 2020 18:48:57 EST Ventricular Rate:  117 PR Interval:    QRS Duration: 86 QT Interval:  314 QTC Calculation: 438 R Axis:   76 Text Interpretation: Sinus tachycardia Minimal ST depression, lateral leads Confirmed by Madalyn Rob [phone]) on 01/06/2020 7:53:22 PM   Radiology DG  Chest Portable 1 View  Result Date: 01/06/2020 CLINICAL DATA:  79 year old male with shortness of breath for 1 week. History of COPD. Audible wheezing on exam. EXAM: PORTABLE CHEST 1 VIEW COMPARISON:  Portable chest 12/31/2019 and earlier. FINDINGS: Portable AP upright view at 1934 hours. Stable lung volumes with borderline to mild hyperinflation. Mediastinal contours remain normal. Visualized tracheal air column is within normal limits. No pneumothorax, pulmonary edema or pleural effusion. Mild bilateral increased interstitial markings appear stable. No acute pulmonary opacity. IMPRESSION: No acute cardiopulmonary abnormality. Electronically Signed   By: Genevie Ann M.D.   On: 01/06/2020 19:43  Procedures Procedures (including critical care time)  Medications Ordered in ED Medications  methylPREDNISolone sodium succinate (SOLU-MEDROL) 125 mg/2 mL injection 125 mg (125 mg Intravenous Given 01/06/20 1932)  albuterol (PROVENTIL,VENTOLIN) solution continuous neb (10 mg/hr Nebulization Given 01/06/20 2019)  ipratropium (ATROVENT) nebulizer solution 0.5 mg (0.5 mg Nebulization Given 01/06/20 2020)    ED Course  I have reviewed the triage vital signs and the nursing notes.  Pertinent labs & imaging results that were available during my care of the patient were reviewed by me and considered in my medical decision making (see chart for details).    MDM Rules/Calculators/A&P                      79 year old male past medical history COPD presents to ER with shortness of breath.  On exam noted significant bilateral expiratory wheeze but not in respiratory distress.  Provided IV steroids, hour-long neb treatment, patient had significant improvement in symptoms, improvement in air entry.  CXR without pneumonia.  Labs within normal limits.  Patient did well on ambulation trial with no desaturation below 90%.  Still has some wheeze, is tachycardic.  Offered admission, patient states he strongly desires to go  home and managed as outpatient if possible.  Given his overall well appearance and response to neb treatment, believe this is reasonable option at this time.  Patient is agreeable to follow-up with his primary doctor tomorrow for close recheck.  Given he recently finished course of steroids, will provide additional steroids but taper.  After the discussed management above, the patient was determined to be safe for discharge.  The patient was in agreement with this plan and all questions regarding their care were answered.  ED return precautions were discussed and the patient will return to the ED with any significant worsening of condition.  Final Clinical Impression(s) / ED Diagnoses Final diagnoses:  COPD exacerbation ([place])    Rx / DC Orders ED Discharge Orders         Ordered    predniSONE (STERAPRED UNI-PAK 21 TAB) 10 MG (21) TBPK tablet  Daily     01/06/20 2227           Lucrezia Starch, MD 01/06/20 2321

## 2020-01-19 ENCOUNTER — Other Ambulatory Visit: Payer: Self-pay

## 2020-01-19 ENCOUNTER — Ambulatory Visit: Payer: Medicare PPO | Admitting: Cardiology

## 2020-01-19 ENCOUNTER — Encounter (HOSPITAL_COMMUNITY): Payer: Self-pay | Admitting: Emergency Medicine

## 2020-01-19 ENCOUNTER — Emergency Department (HOSPITAL_COMMUNITY): Payer: Medicare PPO

## 2020-01-19 ENCOUNTER — Inpatient Hospital Stay (HOSPITAL_COMMUNITY)
Admission: EM | Admit: 2020-01-19 | Discharge: 2020-01-21 | DRG: 190 | Disposition: A | Discharge status: Home / Self Care | Payer: Medicare PPO | Attending: Internal Medicine | Admitting: Internal Medicine

## 2020-01-19 ENCOUNTER — Encounter: Payer: Self-pay | Admitting: Cardiology

## 2020-01-19 VITALS — BP 120/58 | HR 109 | Ht 69.0 in | Wt 260.4 lb

## 2020-01-19 DIAGNOSIS — J471 Bronchiectasis with (acute) exacerbation: Secondary | ICD-10-CM

## 2020-01-19 DIAGNOSIS — I10 Essential (primary) hypertension: Secondary | ICD-10-CM

## 2020-01-19 DIAGNOSIS — I5032 Chronic diastolic (congestive) heart failure: Secondary | ICD-10-CM | POA: Diagnosis present

## 2020-01-19 DIAGNOSIS — J431 Panlobular emphysema: Secondary | ICD-10-CM

## 2020-01-19 DIAGNOSIS — J9621 Acute and chronic respiratory failure with hypoxia: Secondary | ICD-10-CM | POA: Diagnosis present

## 2020-01-19 DIAGNOSIS — G4733 Obstructive sleep apnea (adult) (pediatric): Secondary | ICD-10-CM | POA: Diagnosis present

## 2020-01-19 DIAGNOSIS — E119 Type 2 diabetes mellitus without complications: Secondary | ICD-10-CM | POA: Diagnosis not present

## 2020-01-19 DIAGNOSIS — Z7951 Long term (current) use of inhaled steroids: Secondary | ICD-10-CM | POA: Diagnosis not present

## 2020-01-19 DIAGNOSIS — Z20822 Contact with and (suspected) exposure to covid-19: Secondary | ICD-10-CM | POA: Diagnosis present

## 2020-01-19 DIAGNOSIS — Z7901 Long term (current) use of anticoagulants: Secondary | ICD-10-CM | POA: Diagnosis not present

## 2020-01-19 DIAGNOSIS — I2693 Single subsegmental pulmonary embolism without acute cor pulmonale: Secondary | ICD-10-CM

## 2020-01-19 DIAGNOSIS — Z9109 Other allergy status, other than to drugs and biological substances: Secondary | ICD-10-CM

## 2020-01-19 DIAGNOSIS — E1165 Type 2 diabetes mellitus with hyperglycemia: Secondary | ICD-10-CM | POA: Diagnosis present

## 2020-01-19 DIAGNOSIS — Z6837 Body mass index (BMI) 37.0-37.9, adult: Secondary | ICD-10-CM | POA: Diagnosis not present

## 2020-01-19 DIAGNOSIS — I110 Hypertensive heart disease with heart failure: Secondary | ICD-10-CM | POA: Diagnosis present

## 2020-01-19 DIAGNOSIS — Z7984 Long term (current) use of oral hypoglycemic drugs: Secondary | ICD-10-CM | POA: Diagnosis not present

## 2020-01-19 DIAGNOSIS — R011 Cardiac murmur, unspecified: Secondary | ICD-10-CM

## 2020-01-19 DIAGNOSIS — M109 Gout, unspecified: Secondary | ICD-10-CM | POA: Diagnosis present

## 2020-01-19 DIAGNOSIS — J9601 Acute respiratory failure with hypoxia: Secondary | ICD-10-CM | POA: Diagnosis not present

## 2020-01-19 DIAGNOSIS — Z87891 Personal history of nicotine dependence: Secondary | ICD-10-CM

## 2020-01-19 DIAGNOSIS — J441 Chronic obstructive pulmonary disease with (acute) exacerbation: Secondary | ICD-10-CM | POA: Diagnosis present

## 2020-01-19 DIAGNOSIS — Z79899 Other long term (current) drug therapy: Secondary | ICD-10-CM

## 2020-01-19 DIAGNOSIS — R079 Chest pain, unspecified: Secondary | ICD-10-CM | POA: Insufficient documentation

## 2020-01-19 DIAGNOSIS — Z86711 Personal history of pulmonary embolism: Secondary | ICD-10-CM | POA: Diagnosis not present

## 2020-01-19 DIAGNOSIS — R072 Precordial pain: Secondary | ICD-10-CM

## 2020-01-19 DIAGNOSIS — R0602 Shortness of breath: Secondary | ICD-10-CM

## 2020-01-19 DIAGNOSIS — T380X5A Adverse effect of glucocorticoids and synthetic analogues, initial encounter: Secondary | ICD-10-CM | POA: Diagnosis present

## 2020-01-19 DIAGNOSIS — J449 Chronic obstructive pulmonary disease, unspecified: Secondary | ICD-10-CM

## 2020-01-19 DIAGNOSIS — I2782 Chronic pulmonary embolism: Secondary | ICD-10-CM | POA: Diagnosis not present

## 2020-01-19 LAB — COMPREHENSIVE METABOLIC PANEL
ALT: 42 U/L (ref 0–44)
AST: 24 U/L (ref 15–41)
Albumin: 4.1 g/dL (ref 3.5–5.0)
Alkaline Phosphatase: 49 U/L (ref 38–126)
Anion gap: 9 (ref 5–15)
BUN: 14 mg/dL (ref 8–23)
CO2: 24 mmol/L (ref 22–32)
Calcium: 9 mg/dL (ref 8.9–10.3)
Chloride: 106 mmol/L (ref 98–111)
Creatinine, Ser: 0.84 mg/dL (ref 0.61–1.24)
GFR calc Af Amer: >60 mL/min (ref >60)
GFR calc non Af Amer: >60 mL/min (ref >60)
Glucose, Bld: 154 mg/dL — ABNORMAL HIGH (ref 70–99)
Potassium: 4 mmol/L (ref 3.5–5.1)
Sodium: 139 mmol/L (ref 135–145)
Total Bilirubin: 0.7 mg/dL (ref 0.3–1.2)
Total Protein: 7.3 g/dL (ref 6.5–8.1)

## 2020-01-19 LAB — TROPONIN I (HIGH SENSITIVITY)
Troponin I (High Sensitivity): 5 ng/L (ref <18)
Troponin I (High Sensitivity): 6 ng/L (ref <18)

## 2020-01-19 LAB — RESPIRATORY PANEL BY RT PCR (FLU A&B, COVID)
Influenza A by PCR: NEGATIVE
Influenza B by PCR: NEGATIVE
SARS Coronavirus 2 by RT PCR: NEGATIVE

## 2020-01-19 LAB — CBC WITH DIFFERENTIAL/PLATELET
Abs Immature Granulocytes: 0.05 10*3/uL (ref 0.00–0.07)
Basophils Absolute: 0.1 10*3/uL (ref 0.0–0.1)
Basophils Relative: 1 %
Eosinophils Absolute: 1.1 10*3/uL — ABNORMAL HIGH (ref 0.0–0.5)
Eosinophils Relative: 8 %
HCT: 45.7 % (ref 39.0–52.0)
Hemoglobin: 15.1 g/dL (ref 13.0–17.0)
Immature Granulocytes: 0 %
Lymphocytes Relative: 16 %
Lymphs Abs: 2.1 10*3/uL (ref 0.7–4.0)
MCH: 33.3 pg (ref 26.0–34.0)
MCHC: 33 g/dL (ref 30.0–36.0)
MCV: 100.9 fL — ABNORMAL HIGH (ref 80.0–100.0)
Monocytes Absolute: 1.1 10*3/uL — ABNORMAL HIGH (ref 0.1–1.0)
Monocytes Relative: 8 %
Neutro Abs: 8.9 10*3/uL — ABNORMAL HIGH (ref 1.7–7.7)
Neutrophils Relative %: 67 %
Platelets: 235 10*3/uL (ref 150–400)
RBC: 4.53 MIL/uL (ref 4.22–5.81)
RDW: 14.5 % (ref 11.5–15.5)
WBC: 13.3 10*3/uL — ABNORMAL HIGH (ref 4.0–10.5)
nRBC: 0 % (ref 0.0–0.2)

## 2020-01-19 MED ORDER — METOPROLOL TARTRATE 50 MG PO TABS: 50.0000 mg | ORAL_TABLET | Freq: Once | ORAL | 0 refills | Status: DC

## 2020-01-19 MED ORDER — NITROGLYCERIN 0.4 MG SL SUBL: 0.4000 mg | SUBLINGUAL_TABLET | SUBLINGUAL | 3 refills | Status: DC | PRN

## 2020-01-19 MED ORDER — FUROSEMIDE 40 MG PO TABS: 40.0000 mg | ORAL_TABLET | Freq: Every day | ORAL | 3 refills | Status: DC

## 2020-01-19 MED ORDER — LISINOPRIL 40 MG PO TABS: 20.0000 mg | ORAL_TABLET | Freq: Every day | ORAL | 6 refills | Status: DC

## 2020-01-19 MED ORDER — ALBUTEROL SULFATE (2.5 MG/3ML) 0.083% IN NEBU
5.0000 mg | INHALATION_SOLUTION | Freq: Once | RESPIRATORY_TRACT | Status: AC
  Administered 2020-01-19: 5 mg via RESPIRATORY_TRACT
  Filled 2020-01-19: qty 6

## 2020-01-19 MED ORDER — MAGNESIUM SULFATE 2 GM/50ML IV SOLN
2.0000 g | Freq: Once | INTRAVENOUS | Status: AC
  Administered 2020-01-19: 2 g via INTRAVENOUS
  Filled 2020-01-19: qty 50

## 2020-01-19 MED ORDER — DILTIAZEM HCL ER COATED BEADS 180 MG PO CP24: 180.0000 mg | ORAL_CAPSULE | Freq: Every day | ORAL | 2 refills | Status: DC

## 2020-01-19 MED ORDER — METHYLPREDNISOLONE SODIUM SUCC 125 MG IJ SOLR
125.0000 mg | Freq: Once | INTRAMUSCULAR | Status: AC
  Administered 2020-01-19: 125 mg via INTRAVENOUS
  Filled 2020-01-19: qty 2

## 2020-01-19 NOTE — ED Notes (Signed)
Pt taken off of NRB & placed on 4 L per [place], sats 95-98%, EDP notified

## 2020-01-19 NOTE — H&P (Signed)
TRH H&P    Patient Demographics:    Vincent Cooper, is a 79 y.o. male  MRN: [id number]  DOB - Dec 27, 1940  Admit Date - 01/19/2020  Referring MD/NP/PA: Dr. Roderic Palau  Outpatient Primary MD for the patient is [name], DO  Patient coming from: Home  Chief complaint-shortness of breath   HPI:    Vincent Cooper  is a 79 y.o. 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  Patient was seen by his cardiologist is [facility] who cut down the dose of lisinopril to 20 mg daily.  Patient says that he has been having shortness of breath with wheezing.  In the ED patient was found to be hypoxic, required 4 L/min of oxygen. He denies nausea vomiting or diarrhea. Denies fever or chills. Denies abdominal pain or dysuria. Chest x-ray was unremarkable SARS-CoV-2 PCR was negative    Review of systems:    In addition to the HPI above,    All other systems reviewed and are negative.    Past History of the following :    Past Medical History:  Diagnosis Date  . COPD (chronic obstructive pulmonary disease) ([place])   . Diabetes mellitus without complication ([place])   . Hypertension       History reviewed. No pertinent surgical history.    Social History:      Social History   Tobacco Use  . Smoking status: Former Research scientist (life sciences)  . Smokeless tobacco: Former Network engineer Use Topics  . Alcohol use: Yes    Comment: Daily 2 shots of vodka       Family History :   Denies family history of cancer   Home Medications:   Prior to Admission medications   Medication Sig Start Date End Date Taking? Authorizing Provider  albuterol (VENTOLIN HFA) 108 (90 Base) MCG/ACT inhaler Inhale 1 puff into the lungs every 6 (six) hours as needed for wheezing or shortness of breath.   Yes  [provider]  allopurinol (ZYLOPRIM) 300 MG tablet Take 300 mg by mouth daily.   Yes [provider]  apixaban (ELIQUIS) 5 MG TABS tablet Take 2 tablets (10 mg total) by mouth 2 (two) times daily for 6 days. Patient taking differently: Take 5 mg by mouth 2 (two) times daily.  11/07/19 01/19/20 Yes Agyei, Caprice Kluver, MD  budesonide-formoterol (SYMBICORT) 160-4.5 MCG/ACT inhaler Inhale 2 puffs into the lungs every morning.   Yes [provider]  diltiazem (CARDIZEM CD) 180 MG 24 hr capsule Take 1 capsule (180 mg total) by mouth daily. 01/19/20  Yes Leonie Man, MD  furosemide (LASIX) 40 MG tablet Take 1 tablet (40 mg total) by mouth daily. OR AS DIRECTED 01/19/20 04/18/20 Yes Leonie Man, MD  lisinopril (ZESTRIL) 40 MG tablet Take 0.5 tablets (20 mg total) by mouth daily. 01/19/20  Yes Leonie Man, MD  metFORMIN (GLUCOPHAGE) 500 MG tablet Take 500 mg by mouth 2 (two) times daily  with a meal.   Yes [provider]  nitroGLYCERIN (NITROSTAT) 0.4 MG SL tablet Place 1 tablet (0.4 mg total) under the tongue every 5 (five) minutes as needed for chest pain. 01/19/20 04/18/20 Yes Leonie Man, MD  metoprolol tartrate (LOPRESSOR) 50 MG tablet Take 1 tablet (50 mg total) by mouth once for 1 dose. TAKE TWO HOURS PRIOR TO  SCHEDULE CARDIAC TEST 01/19/20 01/19/20  Leonie Man, MD  predniSONE (STERAPRED UNI-PAK 21 TAB) 10 MG (21) TBPK tablet Take by mouth daily. Take 6 tabs by mouth daily  for 1 day, then 5 tabs for 1 day, then 4 tabs for 1 day, then 3 tabs for 1 day, 2 tabs for 1 day, then 1 tab by mouth daily for 1 day Patient not taking: Reported on 01/19/2020 01/06/20   Lucrezia Starch, MD     Allergies:     Allergies  Allergen Reactions  . Other     Patient reports he was allergic to something in an IV he was given but does not know what the substance was. As of 01/19/2020      Physical Exam:   Vitals  Blood pressure 128/72, pulse (!) 121, temperature 98.1 F  (36.7 C), temperature source Oral, resp. rate 19, height 5\' 9"  (1.753 m), weight 118.1 kg, SpO2 98 %.  1.  General: Appears in no acute distress  2. Psychiatric: Alert, oriented x3, intact insight and judgment  3. Neurologic: Cranial nerves II through grossly intact, no focal deficit noted  4. HEENMT:  Atraumatic normocephalic, extraocular muscles are intact  5. Respiratory : Bilateral wheezing auscultated  6. Cardiovascular : S1-S2, regular, no murmur auscultated, no edema in the lower extremities noted  7. Gastrointestinal:  Abdomen is soft, nontender, no organomegaly      Data Review:    CBC Recent Labs  Lab 01/19/20 1930  WBC 13.3*  HGB 15.1  HCT 45.7  PLT 235  MCV 100.9*  MCH 33.3  MCHC 33.0  RDW 14.5  LYMPHSABS 2.1  MONOABS 1.1*  EOSABS 1.1*  BASOSABS 0.1   ------------------------------------------------------------------------------------------------------------------  Results for orders placed or performed during the hospital encounter of 01/19/20 (from the past 48 hour(s))  CBC with Differential     Status: Abnormal   Collection Time: 01/19/20  7:30 PM  Result Value Ref Range   WBC 13.3 (H) 4.0 - 10.5 K/uL   RBC 4.53 4.22 - 5.81 MIL/uL   Hemoglobin 15.1 13.0 - 17.0 g/dL   HCT 45.7 39.0 - 52.0 %   MCV 100.9 (H) 80.0 - 100.0 fL   MCH 33.3 26.0 - 34.0 pg   MCHC 33.0 30.0 - 36.0 g/dL   RDW 14.5 11.5 - 15.5 %   Platelets 235 150 - 400 K/uL   nRBC 0.0 0.0 - 0.2 %   Neutrophils Relative % 67 %   Neutro Abs 8.9 (H) 1.7 - 7.7 K/uL   Lymphocytes Relative 16 %   Lymphs Abs 2.1 0.7 - 4.0 K/uL   Monocytes Relative 8 %   Monocytes Absolute 1.1 (H) 0.1 - 1.0 K/uL   Eosinophils Relative 8 %   Eosinophils Absolute 1.1 (H) 0.0 - 0.5 K/uL   Basophils Relative 1 %   Basophils Absolute 0.1 0.0 - 0.1 K/uL   Immature Granulocytes 0 %   Abs Immature Granulocytes 0.05 0.00 - 0.07 K/uL    Comment: Performed at [facility], [address] [place], [place]  [address]  Comprehensive metabolic panel  Status: Abnormal   Collection Time: 01/19/20  7:30 PM  Result Value Ref Range   Sodium 139 135 - 145 mmol/L   Potassium 4.0 3.5 - 5.1 mmol/L   Chloride 106 98 - 111 mmol/L   CO2 24 22 - 32 mmol/L   Glucose, Bld 154 (H) 70 - 99 mg/dL    Comment: Glucose reference range applies only to samples taken after fasting for at least 8 hours.   BUN 14 8 - 23 mg/dL   Creatinine, Ser 0.84 0.61 - 1.24 mg/dL   Calcium 9.0 8.9 - 10.3 mg/dL   Total Protein 7.3 6.5 - 8.1 g/dL   Albumin 4.1 3.5 - 5.0 g/dL   AST 24 15 - 41 U/L   ALT 42 0 - 44 U/L   Alkaline Phosphatase 49 38 - 126 U/L   Total Bilirubin 0.7 0.3 - 1.2 mg/dL   GFR calc non Af Amer >60 >60 mL/min   GFR calc Af Amer >60 >60 mL/min   Anion gap 9 5 - 15    Comment: Performed at [facility], [address], [place], [place] [address]  Troponin I (High Sensitivity)     Status: None   Collection Time: 01/19/20  7:30 PM  Result Value Ref Range   Troponin I (High Sensitivity) 5 <18 ng/L    Comment: (NOTE) Elevated high sensitivity troponin I (hsTnI) values and significant  changes across serial measurements may suggest ACS but many other  chronic and acute conditions are known to elevate hsTnI results.  Refer to the "Links" section for chest pain algorithms and additional  guidance. Performed at [facility], [address], [place], [place] [address]   Respiratory Panel by RT PCR (Flu A&B, Covid) - Nasopharyngeal Swab     Status: None   Collection Time: 01/19/20  7:46 PM   Specimen: Nasopharyngeal Swab  Result Value Ref Range   SARS Coronavirus 2 by RT PCR NEGATIVE NEGATIVE    Comment: (NOTE) SARS-CoV-2 target nucleic acids are NOT DETECTED. The SARS-CoV-2 RNA is generally detectable in upper respiratoy specimens during the acute phase of infection. The lowest concentration of SARS-CoV-2 viral copies this assay can detect is 131 copies/mL. A negative result does not preclude  SARS-Cov-2 infection and should not be used as the sole basis for treatment or other patient management decisions. A negative result may occur with  improper specimen collection/handling, submission of specimen other than nasopharyngeal swab, presence of viral mutation(s) within the areas targeted by this assay, and inadequate number of viral copies (<131 copies/mL). A negative result must be combined with clinical observations, patient history, and epidemiological information. The expected result is Negative. Fact Sheet for Patients:  PinkCheek.be Fact Sheet for Healthcare Providers:  GravelBags.it This test is not yet ap proved or cleared by the [place] FDA and  has been authorized for detection and/or diagnosis of SARS-CoV-2 by FDA under an Emergency Use Authorization (EUA). This EUA will remain  in effect (meaning this test can be used) for the duration of the COVID-19 declaration under Section 564(b)(1) of the Act, 21 U.S.C. section 360bbb-3(b)(1), unless the authorization is terminated or revoked sooner.    Influenza A by PCR NEGATIVE NEGATIVE   Influenza B by PCR NEGATIVE NEGATIVE    Comment: (NOTE) The Xpert Xpress SARS-CoV-2/FLU/RSV assay is intended as an aid in  the diagnosis of influenza from Nasopharyngeal swab specimens and  should not be used as a sole basis for treatment. Nasal washings and  aspirates  are unacceptable for Xpert Xpress SARS-CoV-2/FLU/RSV  testing. Fact Sheet for Patients: PinkCheek.be Fact Sheet for Healthcare Providers: GravelBags.it This test is not yet approved or cleared by the [place] FDA and  has been authorized for detection and/or diagnosis of SARS-CoV-2 by  FDA under an Emergency Use Authorization (EUA). This EUA will remain  in effect (meaning this test can be used) for the duration of the  Covid-19 declaration  under Section 564(b)(1) of the Act, 21  U.S.C. section 360bbb-3(b)(1), unless the authorization is  terminated or revoked. Performed at [facility], [address], [place], [place] [address]   Troponin I (High Sensitivity)     Status: None   Collection Time: 01/19/20  9:11 PM  Result Value Ref Range   Troponin I (High Sensitivity) 6 <18 ng/L    Comment: (NOTE) Elevated high sensitivity troponin I (hsTnI) values and significant  changes across serial measurements may suggest ACS but many other  chronic and acute conditions are known to elevate hsTnI results.  Refer to the "Links" section for chest pain algorithms and additional  guidance. Performed at [facility], [address], [place], [place] [address]     Chemistries  Recent Labs  Lab 01/19/20 1930  NA 139  K 4.0  CL 106  CO2 24  GLUCOSE 154*  BUN 14  CREATININE 0.84  CALCIUM 9.0  AST 24  ALT 42  ALKPHOS 49  BILITOT 0.7   ------------------------------------------------------------------------------------------------------------------  ------------------------------------------------------------------------------------------------------------------ GFR: Estimated Creatinine Clearance: 93.4 mL/min (by C-G formula based on SCr of 0.84 mg/dL). Liver Function Tests: Recent Labs  Lab 01/19/20 1930  AST 24  ALT 42  ALKPHOS 49  BILITOT 0.7  PROT 7.3  ALBUMIN 4.1        Imaging Results:    DG Chest Port 1 View  Result Date: 01/19/2020 CLINICAL DATA:  Shortness of breath. EXAM: PORTABLE CHEST 1 VIEW COMPARISON:  01/06/2020 FINDINGS: The heart size and mediastinal contours are within normal limits. Both lungs are clear. The visualized skeletal structures are unremarkable. Aortic calcifications are noted. IMPRESSION: No active disease. Electronically Signed   By: Constance Holster M.D.   On: 01/19/2020 21:40    My personal review of EKG: Rhythm NSR, no ST-T changes   Assessment & Plan:    Active  Problems:   COPD exacerbation ([place])    1. Acute on chronic respiratory failure with hypoxia-likely from underlying COPD, patient given Solu-Medrol 125 mg IV x1 in the ED.  We will continue with Solu-Medrol 60 mg IV every 6 hours, DuoNeb nebulizers every 6 hours, Mucinex 1 tablet p.o. twice daily. 2. COPD exacerbation-as above started on Solu-Medrol, DuoNeb nebulizer, Mucinex. 3. Diabetes mellitus type 2-hold Glucophage, will start sliding scale insulin with NovoLog. 4. History of pulmonary embolism-continue Eliquis 5. Hypertension-blood pressure is stable, continue Cardizem, Zestril 20 mg daily 6. Gout-continue allopurinol 3 mg p.o. daily 7. Morbid obesity    DVT Prophylaxis-apixaban  AM Labs Ordered, also please review Full Orders  Family Communication: Admission, patients condition and plan of care including tests being ordered have been discussed with the patient  who indicate understanding and agree with the plan and Code Status.  Code Status: Full code  Admission status: Inpatient :The appropriate admission status for this patient is INPATIENT. Inpatient status is judged to be reasonable and necessary in order to provide the required intensity of service to ensure the patient's safety. The patient's presenting symptoms, physical exam findings, and initial radiographic and laboratory data in the  context of their chronic comorbidities is felt to place them at high risk for further clinical deterioration. Furthermore, it is not anticipated that the patient will be medically stable for discharge from the hospital within 2 midnights of admission. The following factors support the admission status of inpatient.     The patient's presenting symptoms include shortness of breath The worrisome physical exam findings include requiring 4 L/min of oxygen. The chronic co-morbidities include diabetes mellitus, morbid obesity       * I certify that at the point of admission it is my clinical  judgment that the patient will require inpatient hospital care spanning beyond 2 midnights from the point of admission due to high intensity of service, high risk for further deterioration and high frequency of surveillance required.*  Time spent in minutes : 60 minutes   Asser Lucena S Grayson Pfefferle M.D

## 2020-01-19 NOTE — Patient Instructions (Addendum)
Medication Instructions:   DECREASE LISINOPRIL TO  20 MG  ( 1/2 TABLET OF 40 MG ) DAILY    STARTING DILTIAZEM CD 180 MG -- ONE TABLET DAILY     START LA SIX ( FUROSEMIDE ) 40 MG  TAKE TWICE A DAY  FOR DAY , THEN  ONCE A DAY FOR 3 DAYS THEN TAKE 1/2 TABLET DAILY   NTG 0.4 MG  NITROGLYCERIN  SUBLINGUAL AS NEED FOR CHEST PAIN  UP TO 3 TIMES WITH ONE EPISODE.  SEE INSTRUCTION SHEET - METOPROLOL  *If you need a refill on your cardiac medications before your next appointment, please call your pharmacy*   Lab Work: SEE INSTRUCTION SHEET If you have labs (blood work) drawn today and your tests are completely normal, you will receive your results only by: Marland Kitchen MyChart Message (if you have MyChart) OR . A paper copy in the mail If you have any lab test that is abnormal or we need to change your treatment, we will call you to review the results.   Testing/Procedures: WILL BE SCHEDULE AT [facility]. ONCE AUTHORIZATION IS OBTAINED FROM INSURANCE Your physician has requested that you have cardiac CT. Cardiac computed tomography (CT) is a painless test that uses an x-ray machine to take clear, detailed pictures of your heart. For further information please visit HugeFiesta.tn. Please follow instruction sheet as given.     Follow-Up: At [facility], you and your health needs are our priority.  As part of our continuing mission to provide you with exceptional heart care, we have created designated Provider Care Teams.  These Care Teams include your primary Cardiologist (physician) and Advanced Practice Providers (APPs -  Physician Assistants and Nurse Practitioners) who all work together to provide you with the care you need, when you need it.  We recommend signing up for the patient portal called "MyChart".  Sign up information is provided on this After Visit Summary.  MyChart is used to connect with patients for Virtual Visits (Telemedicine).  Patients are able to view  lab/test results, encounter notes, upcoming appointments, etc.  Non-urgent messages can be sent to your provider as well.   To learn more about what you can do with MyChart, go to [email or web address].    Your next appointment:   2 month(s)  The format for your next appointment:   In Person  Provider:   Glenetta Hew, MD   Other Instructions N/A  Your cardiac CT will be scheduled at  the below location:   [facility] [address] [place], [place] [address] [phone]   If scheduled at [facility], please arrive at the [facility] main entrance of [facility] 30 minutes prior to test start time. Proceed to the [facility] Radiology Department (first floor) to check-in and test prep.   Please follow these instructions carefully (unless otherwise directed):  Hold all erectile dysfunction medications at least 3 days (72 hrs) prior to test.  PLEASE HAVE LABS- [facility] AT LEAST ONE WEEK PRIOR TO TEST   On the Night Before the Test: . Be sure to Drink plenty of water. . Do not consume any caffeinated/decaffeinated beverages or chocolate 12 hours prior to your test. . Do not take any antihistamines 12 hours prior to your test. . If you take Metformin do not take 24 hours prior to test.  On the Day of the Test: . Drink plenty of water. Do not drink any water within one hour  of the test. . Do not eat any food 4 hours prior to the test. . You may take your regular medications prior to the test.  . Take metoprolol (Lopressor) 50 MG  two hours prior to test. . HOLD Furosemide/Hydrochlorothiazide morning of the test.      After the Test: . Drink plenty of water. . After receiving IV contrast, you may experience a mild flushed feeling. This is normal. . On occasion, you may experience a mild rash up to 24 hours after the test. This is not dangerous. If this occurs, you can take Benadryl 25 mg and increase your fluid intake. . If you experience  trouble breathing, this can be serious. If it is severe call 911 IMMEDIATELY. If it is mild, please call our office. . If you take any of these medications: Glipizide/Metformin, Avandament, Glucavance, please do not take 48 hours after completing test unless otherwise instructed.   Once we have confirmed authorization from your insurance company, we will call you to set up a date and time for your test.   For non-scheduling related questions, please contact the cardiac imaging nurse navigator should you have any questions/concerns: Marchia Bond, RN Navigator Cardiac Imaging [facility] [phone] mobile

## 2020-01-19 NOTE — ED Provider Notes (Signed)
[facility] EMERGENCY DEPARTMENT Provider Note   CSN: [id number] Arrival date & time: 01/19/20  1919     History Chief Complaint  Patient presents with  . Shortness of Breath    Vincent Cooper is a 79 y.o. male.  Patient complains of shortness of breath.  Patient has a history of COPD  The history is provided by the patient. No language interpreter was used.  Shortness of Breath Severity:  Moderate Onset quality:  Sudden Timing:  Constant Progression:  Worsening Chronicity:  New Context: not activity   Relieved by:  Nothing Worsened by:  Nothing Ineffective treatments:  None tried Associated symptoms: wheezing   Associated symptoms: no abdominal pain, no chest pain, no cough, no headaches and no rash        Past Medical History:  Diagnosis Date  . COPD (chronic obstructive pulmonary disease) ([place])   . Diabetes mellitus without complication ([place])   . Hypertension     Patient Active Problem List   Diagnosis Date Noted  . Chest pain with moderate risk for cardiac etiology 01/19/2020  . Shortness of breath 01/19/2020  . Morbid obesity ([place]) 01/19/2020  . Cardiac murmur 11/07/2019  . COPD (chronic obstructive pulmonary disease) ([place]) 11/07/2019  . Diabetes mellitus ([place]) 11/07/2019  . 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 Essential hypertension 11/07/2019  . Pulmonary embolism ([place]) 11/06/2019    History reviewed. No pertinent surgical history.     History reviewed. No pertinent family history.  Social History   Tobacco Use  . Smoking status: Former Research scientist (life sciences)  . Smokeless tobacco: Former Network engineer Use Topics  . Alcohol use: Yes    Comment: Daily 2 shots of vodka  . Drug use: Never    Home Medications Prior to Admission medications   Medication Sig Start Date End Date Taking? Authorizing Provider  albuterol (VENTOLIN HFA) 108 (90 Base) MCG/ACT inhaler Inhale 1 puff into the lungs every 6 (six) hours as needed for wheezing or shortness of  breath.   Yes [provider]  allopurinol (ZYLOPRIM) 300 MG tablet Take 300 mg by mouth daily.   Yes [provider]  apixaban (ELIQUIS) 5 MG TABS tablet Take 2 tablets (10 mg total) by mouth 2 (two) times daily for 6 days. Patient taking differently: Take 5 mg by mouth 2 (two) times daily.  11/07/19 01/19/20 Yes Agyei, Caprice Kluver, MD  budesonide-formoterol (SYMBICORT) 160-4.5 MCG/ACT inhaler Inhale 2 puffs into the lungs every morning.   Yes [provider]  diltiazem (CARDIZEM CD) 180 MG 24 hr capsule Take 1 capsule (180 mg total) by mouth daily. 01/19/20  Yes Leonie Man, MD  furosemide (LASIX) 40 MG tablet Take 1 tablet (40 mg total) by mouth daily. OR AS DIRECTED 01/19/20 04/18/20 Yes Leonie Man, MD  lisinopril (ZESTRIL) 40 MG tablet Take 0.5 tablets (20 mg total) by mouth daily. 01/19/20  Yes Leonie Man, MD  metFORMIN (GLUCOPHAGE) 500 MG tablet Take 500 mg by mouth 2 (two) times daily with a meal.   Yes [provider]  nitroGLYCERIN (NITROSTAT) 0.4 MG SL tablet Place 1 tablet (0.4 mg total) under the tongue every 5 (five) minutes as needed for chest pain. 01/19/20 04/18/20 Yes Leonie Man, MD  metoprolol tartrate (LOPRESSOR) 50 MG tablet Take 1 tablet (50 mg total) by mouth once for 1 dose. TAKE TWO HOURS PRIOR TO  SCHEDULE CARDIAC TEST 01/19/20 01/19/20  Leonie Man, MD  predni[facility]E [facility] UNI-PAK 21  TAB) 10 MG (21) TBPK tablet Take by mouth daily. Take 6 tabs by mouth daily  for 1 day, then 5 tabs for 1 day, then 4 tabs for 1 day, then 3 tabs for 1 day, 2 tabs for 1 day, then 1 tab by mouth daily for 1 day Patient not taking: Reported on 01/19/2020 01/06/20   Lucrezia Starch, MD    Allergies    Other  Review of Systems   Review of Systems  Constitutional: Negative for appetite change and fatigue.  HENT: Negative for congestion, ear discharge and sinus pressure.   Eyes: Negative for discharge.  Respiratory: Positive for shortness of breath  and wheezing. Negative for cough.   Cardiovascular: Negative for chest pain.  Gastrointestinal: Negative for abdominal pain and diarrhea.  Genitourinary: Negative for frequency and hematuria.  Musculoskeletal: Negative for back pain.  Skin: Negative for rash.  Neurological: Negative for seizures and headaches.  Psychiatric/Behavioral: Negative for hallucinations.    Physical Exam Updated Vital Signs BP 128/72   Pulse (!) 121   Temp 98.1 F (36.7 C) (Oral)   Resp 19   Ht 5\' 9"  (1.753 m)   Wt 118.1 kg   SpO2 98%   BMI 38.45 kg/m   Physical Exam Vitals and nursing note reviewed.  Constitutional:      Appearance: He is well-developed.  HENT:     Head: Normocephalic.     Nose: Nose normal.  Eyes:     General: No scleral icterus.    Conjunctiva/sclera: Conjunctivae normal.  Neck:     Thyroid: No thyromegaly.  Cardiovascular:     Rate and Rhythm: Normal rate and regular rhythm.     Heart sounds: No murmur. No friction rub. No gallop.   Pulmonary:     Breath sounds: No stridor. Wheezing present. No rales.  Chest:     Chest wall: No tenderness.  Abdominal:     General: There is no distension.     Tenderness: There is no abdominal tenderness. There is no rebound.  Musculoskeletal:        General: Normal range of motion.     Cervical back: Neck supple.  Lymphadenopathy:     Cervical: No cervical adenopathy.  Skin:    Findings: No erythema or rash.  Neurological:     Mental Status: He is alert and oriented to person, place, and time.     Motor: No abnormal muscle tone.     Coordination: Coordination normal.  Psychiatric:        Behavior: Behavior normal.     ED Results / Procedures / Treatments   Labs (all labs ordered are listed, but only abnormal results are displayed) Labs Reviewed  CBC WITH DIFFERENTIAL/PLATELET - Abnormal; Notable for the following components:      Result Value   WBC 13.3 (*)    MCV 100.9 (*)    Neutro Abs 8.9 (*)    Monocytes Absolute  1.1 (*)    Eosinophils Absolute 1.1 (*)    All other components within normal limits  COMPREHENSIVE METABOLIC PANEL - Abnormal; Notable for the following components:   Glucose, Bld 154 (*)    All other components within normal limits  RESPIRATORY PANEL BY RT PCR (FLU A&B, COVID)  TROPONIN I (HIGH SENSITIVITY)  TROPONIN I (HIGH SENSITIVITY)    EKG None  Radiology DG Chest Port 1 View  Result Date: 01/19/2020 CLINICAL DATA:  Shortness of breath. EXAM: PORTABLE CHEST 1 VIEW COMPARISON:  01/06/2020 FINDINGS: The  heart size and mediastinal contours are within normal limits. Both lungs are clear. The visualized skeletal structures are unremarkable. Aortic calcifications are noted. IMPRESSION: No active disease. Electronically Signed   By: Constance Holster M.D.   On: 01/19/2020 21:40    Procedures Procedures (including critical care time)  Medications Ordered in ED Medications  albuterol (PROVENTIL) (2.5 MG/3ML) 0.083% nebulizer solution 5 mg (5 mg Nebulization Given 01/19/20 2128)  methylPREDNISolone sodium succinate (SOLU-MEDROL) 125 mg/2 mL injection 125 mg (125 mg Intravenous Given 01/19/20 2013)  magnesium sulfate IVPB 2 g 50 mL (0 g Intravenous Stopped 01/19/20 2212)    ED Course  I have reviewed the triage vital signs and the nursing notes.  Pertinent labs & imaging results that were available during my care of the patient were reviewed by me and considered in my medical decision making (see chart for details).    MDM Rules/Calculators/A&P                      CRITICAL CARE Performed by: Milton Ferguson Total critical care time: 30 minutes Critical care time was exclusive of separately billable procedures and treating other patients. Critical care was necessary to treat or prevent imminent or life-threatening deterioration. Critical care was time spent personally by me on the following activities: development of treatment plan with patient and/or surrogate as well as nursing,  discussions with consultants, evaluation of patient's response to treatment, examination of patient, obtaining history from patient or surrogate, ordering and performing treatments and interventions, ordering and review of laboratory studies, ordering and review of radiographic studies, pulse oximetry and re-evaluation of patient's condition. Patient with COPD exacerbation.  He will be admitted to medicine Final Clinical Impression(s) / ED Diagnoses Final diagnoses:  COPD exacerbation [facility])    Rx / DC Orders ED Discharge Orders    None       Milton Ferguson, MD 01/19/20 2223

## 2020-01-19 NOTE — Progress Notes (Signed)
Primary Care Provider: [name], DO Cardiologist: No primary care provider on file. Electrophysiologist: None  Clinic Note: Chief Complaint  Patient presents with  . New Patient (Initial Visit)    Shortness of breath  . Sleep Apnea    Uses CPAP, not routinely.    HPI:    Vincent Cooper is a 79 y.o. male with a PMH notable for obesity (morbid), OSA on CPAP, hypertension, hyperlipidemia, DM-2 with documented hypertensive heart disease with recently diagnosed segmental pulmonary emboli who is being seen today for the evaluation of significant SHORTNESS OF BREATH AND CHEST PAIN at the request of [name], DO.  Vincent Cooper was seen on February 23 by Dr. Marveen Reeks complaining of worsening shortness of breath over the past couple months.  He has been to the hospital/emergency room several occasions both [facility] and [facility].  It seems like the symptoms have been progressively worsening since he went in with a PE in mid December 2020.  Recent Hospitalizations:   [facility] admission 12/19-20/2020: Presented to the ER with worsening shortness of breath.  Had elevated D-dimer, referred for CT angio of the chest which revealed both segmental and subsegmental PE with no evidence of RV strain.  Started initially on IV heparin then transitioned to Eliquis 2D echo ordered showing normal EF with mild aortic valve sclerosis.  Treated with Ellipta and albuterol for COPD.  No antibiotics.  ER visit November 13, 2019: ER visit with shortness of breath, thought to be COPD exacerbation.  Notable drop in borderline oxygen saturation with ambulation.  With notable having audible wheezing on exam without stethoscope.  Indicated strict compliance with anticoagulation.  Symptoms improved after inhalers and steroid therapy.  Discharged home with a course of prednisone. ->  Negative COVID-19 screen  December 30, 2018 ED visit [facility]: Increasing wheezing and shortness  of breath for a day.  Was using albuterol and Atrovent without significant benefit, but with increased frequency.  Woke up dyspneic.  Came in via EMS. ->  Noted to have complete resolution of symptoms after nebulizer and Solu-Medrol.  COVID-19, as well as influenza AMB negative.  No evidence of volume overloaded.  ER visit January 06, 2020: 2 having 3 days of worsening dyspnea.  Similar to previous presentation.  No notable benefit with home steroids/inhalers.  Reviewed  CV studies:    The following studies were reviewed today: (if available, images/films reviewed: From Epic Chart or Care Everywhere) . Echocardiogram 10/2019: EF 60 to 65%.  Unable to determine diastolic parameters.  Normal wall motion.  Normal RV size.  Mild LA dilation.  Mild aortic valve sclerosis but no stenosis.  Moderately elevated pulmonary pressures.  (Estimated 40 mmHg) . Chest CTA 10/2019: Poor optimization/opacification of the main PA.  Partially occlusive thrombus within the main posterior right lower lobe as well as segmental/subsegmental branches.  Also probable small thrombus in the anterior right middle lobe segmental branches.  No evidence of RV strain.  Scattered aortic atherosclerosis involving the great vessels noted.  Coronary calcification noted.   Interval History:   Vincent Cooper presents here today for evaluation notably with increased work of breathing and audible expiratory and inspiratory wheezing (more consistent with stridor).  He says he has shortness of breath and coughing.  He has exertional dyspnea as well as resting dyspnea.  More so than he also gets dizzy with exertion, mostly because of tachypnea.  If he continues then he may have some chest discomfort. While  he feels his heart going fast, he does not necessarily noticed any rapid irregular sensation.  Although he has felt dizziness, no syncope or near syncope.  He still uses CPAP, and notes that when he is on it his breathing is better.  He notes  that he has off-and-on episodes of desaturation that can occur with or without exertion.  He has orthopnea and PND, minimal edema.  CV Review of Symptoms (Summary) positive for - chest pain, dyspnea on exertion, edema, orthopnea, paroxysmal nocturnal dyspnea, rapid heart rate, shortness of breath and Fatigue, dizziness negative for - loss of consciousness or Near syncope.  TIA/amaurosis fugax, claudication.  The patient does not have symptoms concerning for COVID-19 infection (fever, chills, cough, or new shortness of breath).  He has had the similar symptoms for couple weeks months now.  He has had at least 2 COVID-19 test that were negative. The patient is practicing social distancing & Masking.  He is waiting short time to recover from his recent ER visits/hospitalizations.   REVIEWED OF SYSTEMS   Review of Systems  Constitutional: Positive for diaphoresis (Occasionally with shortness of breath) and malaise/fatigue.  HENT: Negative for congestion and nosebleeds.   Respiratory: Positive for cough and shortness of breath.   Cardiovascular:       Per HPI  Gastrointestinal: Positive for abdominal pain. Negative for blood in stool, heartburn and melena.  Genitourinary: Negative for hematuria.  Musculoskeletal: Positive for joint pain. Negative for falls and myalgias.  Neurological: Positive for dizziness, tingling, sensory change (Because of dry throat and wheezing.) and headaches.  Psychiatric/Behavioral: Negative for depression (Most difficult to assess) and memory loss (Difficult to assess). The patient is nervous/anxious and has insomnia.   All other systems reviewed and are negative.  I have reviewed and (if needed) personally updated the patient's problem list, medications, allergies, past medical and surgical history, social and family history.   PAST MEDICAL HISTORY   Past Medical History:  Diagnosis Date  . Chronic gout   . COPD (chronic obstructive pulmonary disease) ([place])     Oxygen dependent  . Diabetes mellitus type II, non insulin dependent ([place])   . Hypertension   . Morbid obesity (HCC)    BMI of 38.5 with multiple risk factors.  . OSA on CPAP   . Pulmonary emboli ([place]) 10/2019   Chest CTA-4 Phadke opacification of main PA but there is partially occlusive main posterior RLL and segmental/segmental branches.  Small thrombus noted in the anterior right middle lobe.  No RV strain.  Scattered aortic atherosclerosis involving great vessels.  Coronary calcification noted.  Marland Kitchen RLS (restless legs syndrome)     PAST SURGICAL HISTORY   Past Surgical History:  Procedure Laterality Date  . Cataract surgery Right     MEDICATIONS/ALLERGIES   Current Meds  Medication Sig  . albuterol (VENTOLIN HFA) 108 (90 Base) MCG/ACT inhaler Inhale 1 puff into the lungs every 6 (six) hours as needed for wheezing or shortness of breath.  . allopurinol (ZYLOPRIM) 300 MG tablet Take 300 mg by mouth daily.  Marland Kitchen lisinopril (ZESTRIL) 40 MG tablet Take 0.5 tablets (20 mg total) by mouth daily.  . metFORMIN (GLUCOPHAGE) 500 MG tablet Take 500 mg by mouth 2 (two) times daily with a meal.  . [DISCONTINUED] apixaban (ELIQUIS) 5 MG TABS tablet Take 2 tablets (10 mg total) by mouth 2 (two) times daily for 6 days. (Patient taking differently: Take 5 mg by mouth 2 (two) times daily. )  . [DISCONTINUED] budesonide-formoterol (  SYMBICORT) 160-4.5 MCG/ACT inhaler Inhale 2 puffs into the lungs every morning.  . [DISCONTINUED] lisinopril (ZESTRIL) 40 MG tablet Take 40 mg by mouth daily.  . [DISCONTINUED] predniSONE (STERAPRED UNI-PAK 21 TAB) 10 MG (21) TBPK tablet Take by mouth daily. Take 6 tabs by mouth daily  for 1 day, then 5 tabs for 1 day, then 4 tabs for 1 day, then 3 tabs for 1 day, 2 tabs for 1 day, then 1 tab by mouth daily for 1 day (Patient not taking: Reported on 01/19/2020)    Allergies  Allergen Reactions  . Other     Patient reports he was allergic to something in an IV he was given but  does not know what the substance was. As of 01/19/2020     SOCIAL HISTORY/FAMILY HISTORY   Social History   Tobacco Use  . Smoking status: Former Research scientist (life sciences)  . Smokeless tobacco: Former Network engineer Use Topics  . Alcohol use: Yes    Comment: Daily 2 shots of vodka  . Drug use: Never   Social History   Social History Narrative  . Not on file   Quit smoking roughly 20 years ago, stopped smokeless tobacco several months ago.  Trying to lose weight with new diet.  Lives at home.   Family History  Problem Relation Age of Onset  . CAD Neg Hx   . Diabetes Neg Hx      OBJCTIVE -PE, EKG, labs   Wt Readings from Last 3 Encounters:  01/20/20 254 lb 13.6 oz (115.6 kg)  01/19/20 260 lb 6.4 oz (118.1 kg)  01/06/20 260 lb (117.9 kg)    Physical Exam: BP (!) 120/58   Pulse (!) 109   Ht 5\' 9"  (1.753 m)   Wt 260 lb 6.4 oz (118.1 kg)   BMI 38.45 kg/m  Physical Exam  Constitutional: He is oriented to person, place, and time. He appears well-developed and well-nourished. He appears distressed.  Borderline morbidly obese gentleman.  Well-groomed, but mild to moderate based on the stress.  HENT:  Head: Normocephalic and atraumatic.  Eyes: Pupils are equal, round, and reactive to light. Conjunctivae and EOM are normal.  Neck: No JVD present.  Really unable to assess JVD because of body habitus, and abnormal breath sounds.  Cardiovascular: Normal rate, regular rhythm and intact distal pulses. PMI is not displaced (Unable to palpate). Exam reveals no gallop and no friction rub.  Murmur heard.  Medium-pitched harsh crescendo-decrescendo early systolic murmur is present with a grade of 1/6 at the upper right sternal border. Pulmonary/Chest: He is in respiratory distress (Mild distress). He has no rales. He exhibits no tenderness.  Extensive baseline accessory muscle use.  No obvious distress but mildly increased work of breathing.  Historian expiratory stridor/wheezing.  Diffuse noncardiac  sounds.  Difficult to auscultate heart sounds  Abdominal: Soft. Bowel sounds are normal. There is abdominal tenderness. There is rebound.  Musculoskeletal:        General: Edema present. Normal range of motion.     Cervical back: Normal range of motion and neck supple.  Neurological: He is alert and oriented to person, place, and time. No cranial nerve deficit.  Skin: Skin is warm. He is diaphoretic (Somewhat sweaty and jittery).  Mildly moist, not true diaphoresis.  Psychiatric: He has a normal mood and affect. His behavior is normal. Judgment and thought content normal.  Nursing note reviewed.    Adult ECG Report  Rate: 109 ;  Rhythm: sinus tachycardia and  Normal axis, intervals and durations.;   Narrative Interpretation: Otherwise normal EKG  Recent Labs: 09/08/2019: A1c 5.4; H/H 13.3/38.8.  Glucose 98, BUN/Cr 19/1.01, K+ 4.9; normal LFTs.  TC 231, TG 146, HDL 82, LDL 161. 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  Has been sedentary, but no other causative factor besides a long car ride.  Is on Eliquis without any bleeding issues.  Concern for multiple different PEs with OSA and obesity that he could potentially have symptoms of CTEPH.  Echocardiogram did suggest moderately elevated PA pressures but only 40 mmHg.  We are doing coronary CTA evaluation for potential significant CAD.  If cardiac catheterization is warranted, would have low threshold to consider right and left heart cath.      Relevant Medications   lisinopril (ZESTRIL) 40 MG tablet    nitroGLYCERIN (NITROSTAT) 0.4 MG SL tablet   diltiazem (CARDIZEM CD) 180 MG 24 hr capsule   furosemide (LASIX) 40 MG tablet   Other Relevant Orders   EKG 12-Lead (Completed)   Basic metabolic panel   CT CORONARY MORPH W/CTA COR W/SCORE W/CA W/CM &/OR WO/CM   CT CORONARY FRACTIONAL FLOW RESERVE DATA PREP   CT CORONARY FRACTIONAL FLOW RESERVE FLUID ANALYSIS   Cardiac murmur (Chronic)    Aortic sclerosis seen on echocardiogram.  He does have a systolic ejection murmur.      COPD (chronic obstructive pulmonary disease) (HCC) (Chronic)    On Symbicort and albuterol.  PCP has also referred for pulmonary evaluation.      Relevant Orders   EKG 12-Lead (Completed)   Diabetes mellitus (HCC) (Chronic)    Low threshold to consider Farxiga, but with pretty well controlled A1c, Metformin is reasonable.      Relevant Medications   lisinopril (ZESTRIL) 40 MG tablet   Other Relevant Orders   CT CORONARY MORPH W/CTA COR W/SCORE W/CA W/CM &/OR WO/CM   CT CORONARY FRACTIONAL FLOW RESERVE DATA PREP   CT CORONARY FRACTIONAL FLOW RESERVE FLUID ANALYSIS   Essential hypertension (Chronic)    Blood pressures well controlled.  However he is pretty tachycardic. Also has chronic cough.  May want to consider converting from ACE inhibitor. Plan: We will cut lisinopril dose to 20 mg daily and start moderate dose diltiazem (with the thought of possible pulmonary pretension treatment). We will also add moderate-dose Lasix for blood pressure and dyspnea.      Relevant Medications   lisinopril (ZESTRIL) 40 MG tablet   nitroGLYCERIN (NITROSTAT) 0.4 MG SL tablet   diltiazem (CARDIZEM CD) 180 MG 24 hr capsule   furosemide (LASIX) 40 MG tablet   Shortness of breath (Chronic)    He has baseline dyspnea along exertional dyspnea.  Echocardiogram does not provide enough evidence to suggest this is heart failure related or pulmonary pretension related. With associated chest discomfort, will assess for ischemia with  coronary CT angiogram.      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  Echo did show evidence of diastolic dysfunction, but not significant.  Only mild left atrial enlargement would argue against significant diastolic dysfunction.  Plan:   Reduce lisinopril and start diltiazem XT (avoiding beta-blocker because of COPD) given rapid heart rate.  Add Lasix 40 mg daily and as needed.  Again, if ischemic evaluation suggest need for cardiac catheterization, would have low threshold to plan right and left heart cath.      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  Due for pulmonary evaluation, but is opposed to using CPAP, suggest maybe he needs oxygen. As reportedly had oxygen desaturations although not always.      Chest pain with moderate risk for cardiac etiology - Primary    Chest discomfort associate with exertional dyspnea.  Symptom based on the amount of dyspnea he has as well as coronary artery calcification seen on CT scan, I do think ischemic evaluation is warranted.  He would not be to walk on treadmill for any reasonable amount of time or for to be an accurate study.  As such, I think imaging studies warranted, and with obesity, coronary CTA is better option than Myoview.  Plan: Coronary artery CT angiogram with CT FFR.      Relevant Orders   EKG 12-Lead (Completed)    Basic metabolic panel   CT CORONARY MORPH W/CTA COR W/SCORE W/CA W/CM &/OR WO/CM   CT CORONARY FRACTIONAL FLOW RESERVE DATA PREP   CT CORONARY FRACTIONAL FLOW RESERVE FLUID ANALYSIS   Morbid obesity ([place])    Clearly 1 component of his dyspnea is his obesity.  We need to exclude ischemia so he can proceed with trying to exercise to lose weight.      Relevant Orders   EKG 12-Lead (Completed)   CT CORONARY MORPH W/CTA COR W/SCORE W/CA W/CM &/OR WO/CM   CT CORONARY FRACTIONAL FLOW RESERVE DATA PREP   CT CORONARY FRACTIONAL FLOW RESERVE FLUID ANALYSIS    Other Visit Diagnoses    Precordial pain       Relevant Orders   CT CORONARY MORPH W/CTA COR W/SCORE W/CA W/CM &/OR WO/CM   CT CORONARY FRACTIONAL FLOW RESERVE DATA PREP   CT CORONARY FRACTIONAL FLOW RESERVE FLUID ANALYSIS       COVID-19 Education: The signs and symptoms of COVID-19 were discussed with the patient and how to seek care for testing (follow up with PCP or arrange E-visit).   The importance of social distancing was discussed today.  I spent a total of 73minutes with the patient. >  50% of the time was spent in direct patient consultation.  Additional time spent with chart review  / charting (studies, outside notes, etc): 22; several different hospital visits, clinic notes and other studies reviewed.  Past medical history, surgical history and family history/social history updated. Total Time: 46 min   Current medicines are reviewed at length with the patient today.  (+/- concerns) n/a   Patient  Instructions / Medication Changes & Studies & Tests Ordered   Patient Instructions  Medication Instructions:   DECREASE LISINOPRIL TO  20 MG  ( 1/2 TABLET OF 40 MG ) DAILY    STARTING DILTIAZEM CD 180 MG -- ONE TABLET DAILY     START LA SIX ( FUROSEMIDE ) 40 MG  TAKE TWICE A DAY  FOR DAY , THEN  ONCE A DAY FOR 3 DAYS THEN TAKE 1/2 TABLET DAILY   NTG 0.4 MG  NITROGLYCERIN  SUBLINGUAL AS NEED FOR CHEST PAIN  UP TO 3 TIMES  WITH ONE EPISODE.  SEE INSTRUCTION SHEET - METOPROLOL  *If you need a refill on your cardiac medications before your next appointment, please call your pharmacy*   Lab Work: SEE INSTRUCTION SHEET If you have labs (blood work) drawn today and your tests are completely normal, you will receive your results only by: Marland Kitchen MyChart Message (if you have MyChart) OR . A paper copy in the mail If you have any lab test that is abnormal or we need to change your treatment, we will call you to review the results.   Testing/Procedures: WILL BE SCHEDULE AT [facility] DEPT. ONCE AUTHORIZATION IS OBTAINED FROM INSURANCE Your physician has requested that you have cardiac CT. Cardiac computed tomography (CT) is a painless test that uses an x-ray machine to take clear, detailed pictures of your heart. For further information please visit [email or web address].[email or web address]. Please follow instruction sheet as given.     Follow-Up: At [facility], you and your health needs are our priority.  As part of our continuing mission to provide you with exceptional heart care, we have created designated Provider Care Teams.  These Care Teams include your primary Cardiologist (physician) and Advanced Practice Providers (APPs -  Physician Assistants and Nurse Practitioners) who all work together to provide you with the care you need, when you need it.  We recommend signing up for the patient portal called "MyChart".  Sign up information is provided on this After Visit Summary.  MyChart is used to connect with patients for Virtual Visits (Telemedicine).  Patients are able to view lab/test results, encounter notes, upcoming appointments, etc.  Non-urgent messages can be sent to your provider as well.   To learn more about what you can do with MyChart, go to [email or web address].    Your next appointment:   2 month(s)  The format for your next appointment:   In Person  Provider:   Glenetta Hew, MD   Other  Instructions N/A  Your cardiac CT will be scheduled at  the below location:   [facility] [address] [place], [place] [address] [phone]   If scheduled at [facility], please arrive at the [facility] main entrance of [facility] 30 minutes prior to test start time. Proceed to the [facility] Radiology Department (first floor) to check-in and test prep.   Please follow these instructions carefully (unless otherwise directed):  Hold all erectile dysfunction medications at least 3 days (72 hrs) prior to test.  PLEASE HAVE LABS- BMP AT LEAST ONE WEEK PRIOR TO TEST   On the Night Before the Test: . Be sure to Drink plenty of water. . Do not consume any caffeinated/decaffeinated beverages or chocolate 12 hours prior to your test. . Do not take any antihistamines 12 hours prior to your test. . If you take Metformin do not take 24 hours prior to test.  On the Day of  the Test: . Drink plenty of water. Do not drink any water within one hour of the test. . Do not eat any food 4 hours prior to the test. . You may take your regular medications prior to the test.  . Take metoprolol (Lopressor) 50 MG  two hours prior to test. . HOLD Furosemide/Hydrochlorothiazide morning of the test.      After the Test: . Drink plenty of water. . After receiving IV contrast, you may experience a mild flushed feeling. This is normal. . On occasion, you may experience a mild rash up to 24 hours after the test. This is not dangerous. If this occurs, you can take Benadryl 25 mg and increase your fluid intake. . If you experience trouble breathing, this can be serious. If it is severe call 911 IMMEDIATELY. If it is mild, please call our office. . If you take any of these medications: Glipizide/Metformin, Avandament, Glucavance, please do not take 48 hours after completing test unless otherwise instructed.   Once we have confirmed authorization from your insurance  company, we will call you to set up a date and time for your test.   For non-scheduling related questions, please contact the cardiac imaging nurse navigator should you have any questions/concerns: Marchia Bond, RN Navigator Cardiac Imaging [facility] [phone] mobile    Studies Ordered:   Orders Placed This Encounter  Procedures  . CT CORONARY MORPH W/CTA COR W/SCORE W/CA W/CM &/OR WO/CM  . CT CORONARY FRACTIONAL FLOW RESERVE DATA PREP  . CT CORONARY FRACTIONAL FLOW RESERVE FLUID ANALYSIS  . Basic metabolic panel  . EKG 12-Lead     Glenetta Hew, M.D., M.S. Interventional Cardiologist   Pager # [phone] Phone # [phone] [address] [place], [place] [address]   Thank you for choosing Heartcare at [facility]!!

## 2020-01-19 NOTE — ED Triage Notes (Signed)
Pt brought in by [facility] for COPD exacerbation. Pt was given 125mg  Soulmedrol IV, 10mg  Albuterol, and 0.5mg  Atrovent en route.

## 2020-01-19 NOTE — ED Triage Notes (Deleted)
Pt from home via [facility] with SOB. Pt has COPD. 10mg  Albuterol and  5 [place] Progression Recent Vital Signs   There were no vitals taken for this visit.   Past Medical History:  Diagnosis Date  . COPD (chronic obstructive pulmonary disease) ([place])   . Diabetes mellitus without complication ([place])   . Hypertension      Expected Discharge Date     Diet Order    None       VTE Documentation      Work Intensity Score/Level of Care     @LEVELOFCARE @   Mobility        Significant Events    DC Barriers   Abnormal Labs:  Blanca Friend, Brienna Bass M 01/19/2020, 7:23 PM Atrovent and 125 Solumedrol per EMS.

## 2020-01-20 DIAGNOSIS — J441 Chronic obstructive pulmonary disease with (acute) exacerbation: Principal | ICD-10-CM

## 2020-01-20 LAB — CBC
HCT: 43.5 % (ref 39.0–52.0)
Hemoglobin: 14.5 g/dL (ref 13.0–17.0)
MCH: 33.6 pg (ref 26.0–34.0)
MCHC: 33.3 g/dL (ref 30.0–36.0)
MCV: 100.9 fL — ABNORMAL HIGH (ref 80.0–100.0)
Platelets: 206 10*3/uL (ref 150–400)
RBC: 4.31 MIL/uL (ref 4.22–5.81)
RDW: 14.5 % (ref 11.5–15.5)
WBC: 11.1 10*3/uL — ABNORMAL HIGH (ref 4.0–10.5)
nRBC: 0 % (ref 0.0–0.2)

## 2020-01-20 LAB — COMPREHENSIVE METABOLIC PANEL
ALT: 37 U/L (ref 0–44)
AST: 20 U/L (ref 15–41)
Albumin: 3.8 g/dL (ref 3.5–5.0)
Alkaline Phosphatase: 44 U/L (ref 38–126)
Anion gap: 13 (ref 5–15)
BUN: 21 mg/dL (ref 8–23)
CO2: 24 mmol/L (ref 22–32)
Calcium: 9 mg/dL (ref 8.9–10.3)
Chloride: 102 mmol/L (ref 98–111)
Creatinine, Ser: 1.08 mg/dL (ref 0.61–1.24)
GFR calc Af Amer: >60 mL/min (ref >60)
GFR calc non Af Amer: >60 mL/min (ref >60)
Glucose, Bld: 253 mg/dL — ABNORMAL HIGH (ref 70–99)
Potassium: 4.5 mmol/L (ref 3.5–5.1)
Sodium: 139 mmol/L (ref 135–145)
Total Bilirubin: 1 mg/dL (ref 0.3–1.2)
Total Protein: 6.7 g/dL (ref 6.5–8.1)

## 2020-01-20 LAB — GLUCOSE, CAPILLARY
Glucose-Capillary: 185 mg/dL — ABNORMAL HIGH (ref 70–99)
Glucose-Capillary: 210 mg/dL — ABNORMAL HIGH (ref 70–99)
Glucose-Capillary: 223 mg/dL — ABNORMAL HIGH (ref 70–99)
Glucose-Capillary: 253 mg/dL — ABNORMAL HIGH (ref 70–99)
Glucose-Capillary: 262 mg/dL — ABNORMAL HIGH (ref 70–99)

## 2020-01-20 LAB — HEMOGLOBIN A1C
Hgb A1c MFr Bld: 5.9 % — ABNORMAL HIGH (ref 4.8–5.6)
Mean Plasma Glucose: 122.63 mg/dL

## 2020-01-20 MED ORDER — DILTIAZEM HCL ER COATED BEADS 180 MG PO CP24
180.0000 mg | ORAL_CAPSULE | Freq: Every day | ORAL | Status: DC
  Administered 2020-01-20 – 2020-01-21 (×2): 180 mg via ORAL
  Filled 2020-01-20 (×2): qty 1

## 2020-01-20 MED ORDER — LISINOPRIL 10 MG PO TABS
20.0000 mg | ORAL_TABLET | Freq: Every day | ORAL | Status: DC
  Administered 2020-01-20 – 2020-01-21 (×2): 20 mg via ORAL
  Filled 2020-01-20 (×2): qty 2

## 2020-01-20 MED ORDER — METHYLPREDNISOLONE SODIUM SUCC 125 MG IJ SOLR
60.0000 mg | Freq: Four times a day (QID) | INTRAMUSCULAR | Status: DC
  Administered 2020-01-20 (×2): 60 mg via INTRAVENOUS
  Filled 2020-01-20 (×2): qty 2

## 2020-01-20 MED ORDER — INSULIN DETEMIR 100 UNIT/ML ~~LOC~~ SOLN
8.0000 [IU] | Freq: Every day | SUBCUTANEOUS | Status: DC
  Administered 2020-01-20: 8 [IU] via SUBCUTANEOUS
  Filled 2020-01-20: qty 0.08

## 2020-01-20 MED ORDER — GUAIFENESIN ER 600 MG PO TB12
600.0000 mg | ORAL_TABLET | Freq: Two times a day (BID) | ORAL | Status: DC
  Administered 2020-01-20 – 2020-01-21 (×4): 600 mg via ORAL
  Filled 2020-01-20 (×4): qty 1

## 2020-01-20 MED ORDER — IPRATROPIUM-ALBUTEROL 0.5-2.5 (3) MG/3ML IN SOLN
3.0000 mL | Freq: Four times a day (QID) | RESPIRATORY_TRACT | Status: DC
  Administered 2020-01-20: 3 mL via RESPIRATORY_TRACT

## 2020-01-20 MED ORDER — SODIUM CHLORIDE 0.9% FLUSH: 3.0000 mL | INTRAVENOUS | Status: DC | PRN

## 2020-01-20 MED ORDER — ONDANSETRON HCL 4 MG PO TABS: 4.0000 mg | ORAL_TABLET | Freq: Four times a day (QID) | ORAL | Status: DC | PRN

## 2020-01-20 MED ORDER — IPRATROPIUM-ALBUTEROL 0.5-2.5 (3) MG/3ML IN SOLN
3.0000 mL | RESPIRATORY_TRACT | Status: DC
  Administered 2020-01-20 (×5): 3 mL via RESPIRATORY_TRACT
  Filled 2020-01-20 (×5): qty 3

## 2020-01-20 MED ORDER — ALLOPURINOL 300 MG PO TABS
300.0000 mg | ORAL_TABLET | Freq: Every day | ORAL | Status: DC
  Administered 2020-01-20 – 2020-01-21 (×2): 300 mg via ORAL
  Filled 2020-01-20 (×2): qty 1

## 2020-01-20 MED ORDER — IPRATROPIUM BROMIDE 0.02 % IN SOLN: 0.5000 mg | Freq: Four times a day (QID) | RESPIRATORY_TRACT | Status: DC

## 2020-01-20 MED ORDER — SODIUM CHLORIDE 0.9 % IV SOLN: 250.0000 mL | INTRAVENOUS | Status: DC | PRN

## 2020-01-20 MED ORDER — ACETAMINOPHEN 325 MG PO TABS: 650.0000 mg | ORAL_TABLET | Freq: Four times a day (QID) | ORAL | Status: DC | PRN

## 2020-01-20 MED ORDER — INSULIN ASPART 100 UNIT/ML ~~LOC~~ SOLN
0.0000 [IU] | Freq: Three times a day (TID) | SUBCUTANEOUS | Status: DC
  Administered 2020-01-20: 2 [IU] via SUBCUTANEOUS
  Administered 2020-01-20: 5 [IU] via SUBCUTANEOUS
  Administered 2020-01-20 – 2020-01-21 (×3): 3 [IU] via SUBCUTANEOUS

## 2020-01-20 MED ORDER — ACETAMINOPHEN 650 MG RE SUPP: 650.0000 mg | Freq: Four times a day (QID) | RECTAL | Status: DC | PRN

## 2020-01-20 MED ORDER — ALBUTEROL SULFATE (2.5 MG/3ML) 0.083% IN NEBU: 2.5000 mg | INHALATION_SOLUTION | Freq: Four times a day (QID) | RESPIRATORY_TRACT | Status: DC

## 2020-01-20 MED ORDER — ARFORMOTEROL TARTRATE 15 MCG/2ML IN NEBU
15.0000 ug | INHALATION_SOLUTION | Freq: Two times a day (BID) | RESPIRATORY_TRACT | Status: DC
  Administered 2020-01-20 – 2020-01-21 (×3): 15 ug via RESPIRATORY_TRACT
  Filled 2020-01-20 (×3): qty 2

## 2020-01-20 MED ORDER — SODIUM CHLORIDE 0.9% FLUSH
3.0000 mL | Freq: Two times a day (BID) | INTRAVENOUS | Status: DC
  Administered 2020-01-20 – 2020-01-21 (×4): 3 mL via INTRAVENOUS

## 2020-01-20 MED ORDER — BUDESONIDE 0.5 MG/2ML IN SUSP
0.5000 mg | Freq: Two times a day (BID) | RESPIRATORY_TRACT | Status: DC
  Administered 2020-01-20 – 2020-01-21 (×2): 0.5 mg via RESPIRATORY_TRACT
  Filled 2020-01-20 (×2): qty 2

## 2020-01-20 MED ORDER — METHYLPREDNISOLONE SODIUM SUCC 125 MG IJ SOLR
60.0000 mg | Freq: Three times a day (TID) | INTRAMUSCULAR | Status: DC
  Administered 2020-01-20 – 2020-01-21 (×3): 60 mg via INTRAVENOUS
  Filled 2020-01-20 (×3): qty 2

## 2020-01-20 MED ORDER — ONDANSETRON HCL 4 MG/2ML IJ SOLN: 4.0000 mg | Freq: Four times a day (QID) | INTRAMUSCULAR | Status: DC | PRN

## 2020-01-20 MED ORDER — FUROSEMIDE 40 MG PO TABS
40.0000 mg | ORAL_TABLET | Freq: Every day | ORAL | Status: DC
  Administered 2020-01-20 – 2020-01-21 (×2): 40 mg via ORAL
  Filled 2020-01-20 (×2): qty 1

## 2020-01-20 MED ORDER — APIXABAN 5 MG PO TABS
5.0000 mg | ORAL_TABLET | Freq: Two times a day (BID) | ORAL | Status: DC
  Administered 2020-01-20 – 2020-01-21 (×4): 5 mg via ORAL
  Filled 2020-01-20 (×4): qty 1

## 2020-01-20 NOTE — Progress Notes (Signed)
Patient is refusing the use of CPAP at this time. Rt educated patient and informed patient if he changes his mind have RN contact RT. RN aware.

## 2020-01-20 NOTE — Progress Notes (Addendum)
PROGRESS NOTE    Vincent Cooper  [id number] DOB: 1941-06-22 DOA: 01/19/2020 PCP: [name], DO     Brief Narrative:  Per H&P written by Dr. [name] on 01/20/2020 79 y.o. 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  Patient was seen by his cardiologist is [facility] who cut down the dose of lisinopril to 20 mg daily.  Patient says that he has been having shortness of breath with wheezing.  In the ED patient was found to be hypoxic, required 4 L/min of oxygen. He denies nausea vomiting or diarrhea. Denies fever or chills. Denies abdominal pain or dysuria. Chest x-ray was unremarkable SARS-CoV-2 PCR was negative  Assessment & Plan: 1-Acute respiratory failure with hypoxia: In the setting of COPD exacerbation. -On presentation patient requiring 4 L nasal cannula supplementation secondary to hypoxia, in order to maintain O2 sat above 92%. -Diffuse expiratory wheezing appreciated on exam. -Negative troponin, no chest pain and no acute ischemic changes appreciated on telemetry or EKG. -Continue treatment with IV steroids, nebulizer management, flutter valve and supportive care. -No fever or productive cough reported. -Started on Pulmicort and Brovana. -Follow clinical response. -Still with diffuse wheezing, short of breath on exertion having difficulty speaking in full sentences.  2-type 2 diabetes mellitus: With hyperglycemia in the setting of a steroid usage -Continue sliding scale insulin -Continue holding oral hypoglycemic agents -Start low-dose Levemir nightly and follow CBGs fluctuation. -Modified carbohydrate diet has been ordered. -A1c 5.9  3-history of pulmonary embolism -Continue treatment with Eliquis.  4-essential hypertension -Is stable and well-controlled -Continue the use of Cardizem and Zestril.  5-history of gout -No signs  of acute flare appreciated. -Continue OP renal.  6-class II obesity -Body mass index is 37.64 kg/m. -Low calorie diet, portion control and increase physical activity discussed with patient.  7-OSA -continue QHS CPAP.   DVT prophylaxis: eliquis Code Status: Full Code Family Communication: Daughter updated over the phone. Disposition Plan: Remains inpatient, continue IV steroids, nebulizer management, flutter valve, mucolytic's and wean oxygen supplementation as tolerated.  Hopefully discharge home in the next 48 hours after stabilizing his respiratory status.  Consultants:   None  Procedures:   See below for x-ray reports  Antimicrobials:  Anti-infectives (From admission, onward)   None      Subjective: Afebrile, no chest pain, no nausea, no vomiting.  Having difficulty speaking in full sentences still requiring oxygen supplementation to maintain O2 sat.  Objective: Vitals:   01/20/20 0839 01/20/20 0906 01/20/20 1233 01/20/20 1406  BP:  106/79  (!) 121/50  Pulse:    87  Resp:    20  Temp:    98.4 F (36.9 C)  TempSrc:    Oral  SpO2: 97%  96% 98%  Weight:      Height:        Intake/Output Summary (Last 24 hours) at 01/20/2020 1446 Last data filed at 01/20/2020 0500 Gross per 24 hour  Intake --  Output 275 ml  Net -275 ml   Filed Weights   01/19/20 1926 01/20/20 0005  Weight: 118.1 kg 115.6 kg    Examination: General exam: Alert, awake, oriented x 3; short of breath with exertion, still with diffuse expiratory wheezing on auscultation and having difficulty speaking in full sentences.  Patient is requiring 3 L nasal cannula supplementation to keep O2 sat above 92%. Respiratory system: Positive scattered rhonchi right, diffuse expiratory wheezing, positive  tachypnea with activity; no using accessory muscles. Cardiovascular system:RRR. No murmurs, rubs, gallops.  No JVD on exam. Gastrointestinal system: Abdomen is nondistended, soft and nontender. No organomegaly  or masses felt. Normal bowel sounds heard. Central nervous system: Alert and oriented. No focal neurological deficits. Extremities: No cyanosis, clubbing or edema. Skin: No rashes, lesions or ulcers Psychiatry: Judgement and insight appear normal. Mood & affect appropriate.     Data Reviewed: I have personally reviewed following labs and imaging studies  CBC: Recent Labs  Lab 01/19/20 1930 01/20/20 0422  WBC 13.3* 11.1*  NEUTROABS 8.9*  --   HGB 15.1 14.5  HCT 45.7 43.5  MCV 100.9* 100.9*  PLT 235 [id number]   Basic Metabolic Panel: Recent Labs  Lab 01/19/20 1930 01/20/20 0422  NA 139 139  K 4.0 4.5  CL 106 102  CO2 24 24  GLUCOSE 154* 253*  BUN 14 21  CREATININE 0.84 1.08  CALCIUM 9.0 9.0   GFR: Estimated Creatinine Clearance: 71.9 mL/min (by C-G formula based on SCr of 1.08 mg/dL).   Liver Function Tests: Recent Labs  Lab 01/19/20 1930 01/20/20 0422  AST 24 20  ALT 42 37  ALKPHOS 49 44  BILITOT 0.7 1.0  PROT 7.3 6.7  ALBUMIN 4.1 3.8   HbA1C: Recent Labs    01/19/20 1930  HGBA1C 5.9*   CBG: Recent Labs  Lab 01/20/20 0009 01/20/20 0753 01/20/20 1148  GLUCAP 210* 223* 262*    Recent Results (from the past 240 hour(s))  Respiratory Panel by RT PCR (Flu A&B, Covid) - Nasopharyngeal Swab     Status: None   Collection Time: 01/19/20  7:46 PM   Specimen: Nasopharyngeal Swab  Result Value Ref Range Status   SARS Coronavirus 2 by RT PCR NEGATIVE NEGATIVE Final    Comment: (NOTE) SARS-CoV-2 target nucleic acids are NOT DETECTED. The SARS-CoV-2 RNA is generally detectable in upper respiratoy specimens during the acute phase of infection. The lowest concentration of SARS-CoV-2 viral copies this assay can detect is 131 copies/mL. A negative result does not preclude SARS-Cov-2 infection and should not be used as the sole basis for treatment or other patient management decisions. A negative result may occur with  improper specimen collection/handling,  submission of specimen other than nasopharyngeal swab, presence of viral mutation(s) within the areas targeted by this assay, and inadequate number of viral copies (<131 copies/mL). A negative result must be combined with clinical observations, patient history, and epidemiological information. The expected result is Negative. Fact Sheet for Patients:  PinkCheek.be Fact Sheet for Healthcare Providers:  GravelBags.it This test is not yet ap proved or cleared by the [place] FDA and  has been authorized for detection and/or diagnosis of SARS-CoV-2 by FDA under an Emergency Use Authorization (EUA). This EUA will remain  in effect (meaning this test can be used) for the duration of the COVID-19 declaration under Section 564(b)(1) of the Act, 21 U.S.C. section 360bbb-3(b)(1), unless the authorization is terminated or revoked sooner.    Influenza A by PCR NEGATIVE NEGATIVE Final   Influenza B by PCR NEGATIVE NEGATIVE Final    Comment: (NOTE) The Xpert Xpress SARS-CoV-2/FLU/RSV assay is intended as an aid in  the diagnosis of influenza from Nasopharyngeal swab specimens and  should not be used as a sole basis for treatment. Nasal washings and  aspirates are unacceptable for Xpert Xpress SARS-CoV-2/FLU/RSV  testing. Fact Sheet for Patients: PinkCheek.be Fact Sheet for Healthcare Providers: GravelBags.it This test is not yet approved or  cleared by the [place] and  has been authorized for detection and/or diagnosis of SARS-CoV-2 by  FDA under an Emergency Use Authorization (EUA). This EUA will remain  in effect (meaning this test can be used) for the duration of the  Covid-19 declaration under Section 564(b)(1) of the Act, 21  U.S.C. section 360bbb-3(b)(1), unless the authorization is  terminated or revoked. Performed at [facility], [address],  [place], [place] [address]      Radiology Studies: [facility] 1 View  Result Date: 01/19/2020 CLINICAL DATA:  Shortness of breath. EXAM: PORTABLE CHEST 1 VIEW COMPARISON:  01/06/2020 FINDINGS: The heart size and mediastinal contours are within normal limits. Both lungs are clear. The visualized skeletal structures are unremarkable. Aortic calcifications are noted. IMPRESSION: No active disease. Electronically Signed   By: Constance Holster M.D.   On: 01/19/2020 21:40    Scheduled Meds: . allopurinol  300 mg Oral Daily  . apixaban  5 mg Oral BID  . arformoterol  15 mcg Nebulization BID  . budesonide (PULMICORT) nebulizer solution  0.5 mg Nebulization BID  . diltiazem  180 mg Oral Daily  . furosemide  40 mg Oral Daily  . guaiFENesin  600 mg Oral BID  . insulin aspart  0-9 Units Subcutaneous TID WC  . insulin detemir  8 Units Subcutaneous QHS  . ipratropium-albuterol  3 mL Nebulization Q4H  . lisinopril  20 mg Oral Daily  . methylPREDNISolone (SOLU-MEDROL) injection  60 mg Intravenous Q8H  . sodium chloride flush  3 mL Intravenous Q12H   Continuous Infusions: . sodium chloride       LOS: 1 day    Time spent: 30 minutes.    Barton Dubois, MD [facility] Pager [phone]   01/20/2020, 2:46 PM

## 2020-01-21 DIAGNOSIS — J9601 Acute respiratory failure with hypoxia: Secondary | ICD-10-CM

## 2020-01-21 DIAGNOSIS — J471 Bronchiectasis with (acute) exacerbation: Secondary | ICD-10-CM

## 2020-01-21 DIAGNOSIS — I2782 Chronic pulmonary embolism: Secondary | ICD-10-CM

## 2020-01-21 DIAGNOSIS — I5032 Chronic diastolic (congestive) heart failure: Secondary | ICD-10-CM

## 2020-01-21 LAB — GLUCOSE, CAPILLARY
Glucose-Capillary: 201 mg/dL — ABNORMAL HIGH (ref 70–99)
Glucose-Capillary: 248 mg/dL — ABNORMAL HIGH (ref 70–99)
Glucose-Capillary: 210 mg/dL — ABNORMAL HIGH (ref 70–99)

## 2020-01-21 MED ORDER — IPRATROPIUM-ALBUTEROL 0.5-2.5 (3) MG/3ML IN SOLN
3.0000 mL | Freq: Four times a day (QID) | RESPIRATORY_TRACT | Status: DC
  Administered 2020-01-21 (×2): 3 mL via RESPIRATORY_TRACT
  Filled 2020-01-21 (×2): qty 3

## 2020-01-21 MED ORDER — GUAIFENESIN ER 600 MG PO TB12: 600.0000 mg | ORAL_TABLET | Freq: Two times a day (BID) | ORAL | 0 refills | Status: DC

## 2020-01-21 MED ORDER — IPRATROPIUM-ALBUTEROL 0.5-2.5 (3) MG/3ML IN SOLN: 3.0000 mL | RESPIRATORY_TRACT | Status: DC | PRN

## 2020-01-21 MED ORDER — BUDESONIDE-FORMOTEROL FUMARATE 160-4.5 MCG/ACT IN AERO: 2.0000 | INHALATION_SPRAY | Freq: Two times a day (BID) | RESPIRATORY_TRACT | 12 refills | Status: DC

## 2020-01-21 MED ORDER — PREDNISONE 20 MG PO TABS: ORAL_TABLET | ORAL | 0 refills | Status: DC

## 2020-01-21 MED ORDER — APIXABAN 5 MG PO TABS: 5.0000 mg | ORAL_TABLET | Freq: Two times a day (BID) | ORAL | Status: DC

## 2020-01-21 MED ORDER — DOXYCYCLINE HYCLATE 100 MG PO TABS: 100.0000 mg | ORAL_TABLET | Freq: Two times a day (BID) | ORAL | 0 refills | Status: AC

## 2020-01-21 NOTE — Progress Notes (Addendum)
SATURATION QUALIFICATIONS: (This note is used to comply with regulatory documentation for home oxygen)  Patient Saturations on Room Air at Rest = 96%  Patient Saturations on Room Air while Ambulating = 96%  Patient Saturations on xxL of oxygen while Ambulating =   Please briefly explain why patient needs home oxygen:  Patient was walked from his room to the nurses's station on RA.  Patient's O2 saturation remained @ 96% and he did not become SOB.  It appears the patient does not need O2.

## 2020-01-21 NOTE — Discharge Summary (Signed)
Physician Discharge Summary  Vincent Cooper [id number] DOB: 12/03/1940 DOA: 01/19/2020  PCP: [name], DO  Admit date: 01/19/2020 Discharge date: 01/21/2020  Time spent: 35 minutes  Recommendations for Outpatient Follow-up:  1. Repeat basic metabolic panel to follow electrolytes and renal function 2. Once again reassess patient oxygen supplementation needs especially on exertion and make proper arrangements as required.   Discharge Diagnoses:  Active Problems:   COPD exacerbation (HCC)   Acute respiratory failure with hypoxia (HCC)   Bronchiectasis with acute exacerbation (HCC)   Chronic diastolic HF (heart failure) (HCC) Essential hypertension Class II obesity with a BMI of 37.6 Type 2 diabetes mellitus with hyperglycemia in the setting of his steroid usage (controlled with appreciated complications). Obstructive sleep apnea History of pulmonary embolism (on chronic anticoagulation.  Discharge Condition: Stable and improved.  Discharged home with instruction to follow-up with PCP in 10 days.  CODE STATUS: Full code.  Diet recommendation: Heart healthy and modified carbohydrates diet.  Filed Weights   01/19/20 1926 01/20/20 0005  Weight: 118.1 kg 115.6 kg    History of present illness:  Per H&P written by Dr. [name] on 01/20/2020 79 y.o.male,with history of morbid obesity, COPD, diabetes mellitus type 2, hypertension, pulmonary embolism on anticoagulation with Eliquis came to hospital with worsening shortness of breath. Patient was seen by his cardiologist is [facility] who cut down the dose of lisinopril to 20 mg daily.Patient says that he has been having shortness of breath with wheezing. In the ED patient was found to be hypoxic, required 4 L/min of oxygen. He denies nausea vomiting or diarrhea. Denies fever or chills. Denies abdominal pain or dysuria. Chest x-ray was unremarkable SARS-CoV-2 PCR was negative  Hospital Course:  1-Acute respiratory failure with  hypoxia: In the setting of COPD exacerbation. -On presentation patient requiring 4 L nasal cannula supplementation secondary to hypoxia, in order to maintain O2 sat above 92%. -Diffuse expiratory wheezing appreciated on exam. -Negative troponin, no chest pain and no acute ischemic changes appreciated on telemetry or EKG. -Discharged home with instructions to resume twice a day Symbicort inhaler, prednisone tapering and as needed short acting bronchodilators. -No fever, no CP -Started demonstrating complaining of productive coughing spells, suggesting bronchiectasis component.  This will be treated with the use of mucolytic's and a 5 days course of doxycycline. -Able to speak in full sentences, no desaturation on exertion or at rest while on room air appreciated; patient reports feeling breathing back to baseline, even if still having son mild expiratory wheezing on examination.  2-type 2 diabetes mellitus: With hyperglycemia in the setting of a steroid usage -Patient was kept on low-dose Levemir and sliding scale insulin while inpatient. -Resume home oral hypoglycemic agents at time of discharge. -Modified carbohydrate diet has been ordered. -A1c 5.9  3-history of pulmonary embolism -Continue treatment with Eliquis. -Patient denies any chest pain and was found not to be hypoxic after stabilizing his COPD exacerbation.  4-essential hypertension -stable and well-controlled -Continue the use of Cardizem and Zestril. -Heart healthy/low-sodium diet has been encouraged.  5-history of gout -No signs of acute flare appreciated. -Continue allopurinol.  6-class II obesity -Body mass index is 37.64 kg/m. -Low calorie diet, portion control and increase physical activity discussed with patient.  7-OSA -continue QHS CPAP.  8-chronic diastolic heart failure -Continue low-sodium diet, daily use of Lasix -Blood pressure control and daily weights.  Procedures:  See below for x-ray  reports.  Consultations:  None  Discharge Exam: Vitals:   01/21/20 1353 01/21/20 1409  BP:  107/62  Pulse:  79  Resp:  20  Temp:  97.9 F (36.6 C)  SpO2: 94% 96%    General: Afebrile, no chest pain, no nausea, no vomiting.  Good oxygen saturation on room air at rest and on exertion.  Patient with improved air movement bilaterally and is speaking in full sentences today.  He wants to go home. Cardiovascular: S1 and S2; no rubs, no gallops, no JVD. Respiratory: Improved air movement bilaterally; mild expiratory wheezing is still appreciated diffusely; no using accessory muscles.  No requiring oxygen supplementation and is speaking in full sentences.  Normal respiratory effort. Abdomen: Soft, nontender, nondistended, positive bowel sounds Extremities: No edema, no cyanosis or clubbing.  Discharge Instructions   Discharge Instructions    (HEART FAILURE PATIENTS) Call MD:  Anytime you have any of the following symptoms: 1) 3 pound weight gain in 24 hours or 5 pounds in 1 week 2) shortness of breath, with or without a dry hacking cough 3) swelling in the hands, feet or stomach 4) if you have to sleep on extra pillows at night in order to breathe.   Complete by: As directed    Diet - low sodium heart healthy   Complete by: As directed    Discharge instructions   Complete by: As directed    Take medications as prescribed Maintain adequate hydration Arrange follow-up with PCP in 10 days Please pace yourself while doing activities to prevent short winded/significant shortness of breath sensation.     Allergies as of 01/21/2020      Reactions   Other    Patient reports he was allergic to something in an IV he was given but does not know what the substance was. As of 01/19/2020       Medication List    STOP taking these medications   metoprolol tartrate 50 MG tablet Commonly known as: LOPRESSOR   predniSONE 10 MG (21) Tbpk tablet Commonly known as: STERAPRED UNI-PAK 21  TAB Replaced by: predniSONE 20 MG tablet     TAKE these medications   albuterol 108 (90 Base) MCG/ACT inhaler Commonly known as: VENTOLIN HFA Inhale 1 puff into the lungs every 6 (six) hours as needed for wheezing or shortness of breath.   allopurinol 300 MG tablet Commonly known as: ZYLOPRIM Take 300 mg by mouth daily.   apixaban 5 MG Tabs tablet Commonly known as: ELIQUIS Take 1 tablet (5 mg total) by mouth 2 (two) times daily.   budesonide-formoterol 160-4.5 MCG/ACT inhaler Commonly known as: SYMBICORT Inhale 2 puffs into the lungs in the morning and at bedtime. What changed: when to take this   diltiazem 180 MG 24 hr capsule Commonly known as: Cardizem CD Take 1 capsule (180 mg total) by mouth daily.   doxycycline 100 MG tablet Commonly known as: VIBRA-TABS Take 1 tablet (100 mg total) by mouth 2 (two) times daily for 5 days.   furosemide 40 MG tablet Commonly known as: LASIX Take 1 tablet (40 mg total) by mouth daily. OR AS DIRECTED   guaiFENesin 600 MG 12 hr tablet Commonly known as: MUCINEX Take 1 tablet (600 mg total) by mouth 2 (two) times daily.   lisinopril 40 MG tablet Commonly known as: ZESTRIL Take 0.5 tablets (20 mg total) by mouth daily.   metFORMIN 500 MG tablet Commonly known as: GLUCOPHAGE Take 500 mg by mouth 2 (two) times daily with a meal.   nitroGLYCERIN 0.4 MG SL tablet Commonly known as: NITROSTAT Place 1  tablet (0.4 mg total) under the tongue every 5 (five) minutes as needed for chest pain.   predniSONE 20 MG tablet Commonly known as: Deltasone Take 3 tablets daily x1 day; then 2 tablet by mouth daily x2 day; then 1 tablet by mouth daily x3 days; then half tablet by mouth daily x3 days and stop prednisone. Replaces: predniSONE 10 MG (21) Tbpk tablet      Allergies  Allergen Reactions  . Other     Patient reports he was allergic to something in an IV he was given but does not know what the substance was. As of 01/19/2020     Follow-up Information    [place], [place]. Schedule an appointment as soon as possible for a visit in 10 day(s).   Specialty: Family Medicine Contact information: [place] [address] [phone]           The results of significant diagnostics from this hospitalization (including imaging, microbiology, ancillary and laboratory) are listed below for reference.    Significant Diagnostic Studies: DG Chest Port 1 View  Result Date: 01/19/2020 CLINICAL DATA:  Shortness of breath. EXAM: PORTABLE CHEST 1 VIEW COMPARISON:  01/06/2020 FINDINGS: The heart size and mediastinal contours are within normal limits. Both lungs are clear. The visualized skeletal structures are unremarkable. Aortic calcifications are noted. IMPRESSION: No active disease. Electronically Signed   By: Constance Holster M.D.   On: 01/19/2020 21:40   DG Chest Portable 1 View  Result Date: 01/06/2020 CLINICAL DATA:  79 year old male with shortness of breath for 1 week. History of COPD. Audible wheezing on exam. EXAM: PORTABLE CHEST 1 VIEW COMPARISON:  Portable chest 12/31/2019 and earlier. FINDINGS: Portable AP upright view at 1934 hours. Stable lung volumes with borderline to mild hyperinflation. Mediastinal contours remain normal. Visualized tracheal air column is within normal limits. No pneumothorax, pulmonary edema or pleural effusion. Mild bilateral increased interstitial markings appear stable. No acute pulmonary opacity. IMPRESSION: No acute cardiopulmonary abnormality. Electronically Signed   By: Genevie Ann M.D.   On: 01/06/2020 19:43   DG Chest Portable 1 View  Result Date: 12/31/2019 CLINICAL DATA:  Shortness of breath EXAM: PORTABLE CHEST 1 VIEW COMPARISON:  November 13, 2019 FINDINGS: The heart size and mediastinal contours are unchanged with mild cardiomegaly. Aortic knob calcifications. There is prominence of the central pulmonary vasculature. No large airspace consolidation or pleural effusion. No  acute osseous abnormality. IMPRESSION: Stable cardiomegaly and pulmonary vascular congestion. Electronically Signed   By: Prudencio Pair M.D.   On: 12/31/2019 01:31    Microbiology: Recent Results (from the past 240 hour(s))  Respiratory Panel by RT PCR (Flu A&B, Covid) - Nasopharyngeal Swab     Status: None   Collection Time: 01/19/20  7:46 PM   Specimen: Nasopharyngeal Swab  Result Value Ref Range Status   SARS Coronavirus 2 by RT PCR NEGATIVE NEGATIVE Final    Comment: (NOTE) SARS-CoV-2 target nucleic acids are NOT DETECTED. The SARS-CoV-2 RNA is generally detectable in upper respiratoy specimens during the acute phase of infection. The lowest concentration of SARS-CoV-2 viral copies this assay can detect is 131 copies/mL. A negative result does not preclude SARS-Cov-2 infection and should not be used as the sole basis for treatment or other patient management decisions. A negative result may occur with  improper specimen collection/handling, submission of specimen other than nasopharyngeal swab, presence of viral mutation(s) within the areas targeted by this assay, and inadequate number of viral copies (<131 copies/mL). A negative result  must be combined with clinical observations, patient history, and epidemiological information. The expected result is Negative. Fact Sheet for Patients:  PinkCheek.be Fact Sheet for Healthcare Providers:  GravelBags.it This test is not yet ap proved or cleared by the [place] FDA and  has been authorized for detection and/or diagnosis of SARS-CoV-2 by FDA under an Emergency Use Authorization (EUA). This EUA will remain  in effect (meaning this test can be used) for the duration of the COVID-19 declaration under Section 564(b)(1) of the Act, 21 U.S.C. section 360bbb-3(b)(1), unless the authorization is terminated or revoked sooner.    Influenza A by PCR NEGATIVE NEGATIVE Final    Influenza B by PCR NEGATIVE NEGATIVE Final    Comment: (NOTE) The Xpert Xpress SARS-CoV-2/FLU/RSV assay is intended as an aid in  the diagnosis of influenza from Nasopharyngeal swab specimens and  should not be used as a sole basis for treatment. Nasal washings and  aspirates are unacceptable for Xpert Xpress SARS-CoV-2/FLU/RSV  testing. Fact Sheet for Patients: PinkCheek.be Fact Sheet for Healthcare Providers: GravelBags.it This test is not yet approved or cleared by the [place] FDA and  has been authorized for detection and/or diagnosis of SARS-CoV-2 by  FDA under an Emergency Use Authorization (EUA). This EUA will remain  in effect (meaning this test can be used) for the duration of the  Covid-19 declaration under Section 564(b)(1) of the Act, 21  U.S.C. section 360bbb-3(b)(1), unless the authorization is  terminated or revoked. Performed at [facility], [address], [place], [place] [address]      Labs: Basic Metabolic Panel: Recent Labs  Lab 01/19/20 1930 01/20/20 0422  NA 139 139  K 4.0 4.5  CL 106 102  CO2 24 24  GLUCOSE 154* 253*  BUN 14 21  CREATININE 0.84 1.08  CALCIUM 9.0 9.0   Liver Function Tests: Recent Labs  Lab 01/19/20 1930 01/20/20 0422  AST 24 20  ALT 42 37  ALKPHOS 49 44  BILITOT 0.7 1.0  PROT 7.3 6.7  ALBUMIN 4.1 3.8   CBC: Recent Labs  Lab 01/19/20 1930 01/20/20 0422  WBC 13.3* 11.1*  NEUTROABS 8.9*  --   HGB 15.1 14.5  HCT 45.7 43.5  MCV 100.9* 100.9*  PLT 235 206   CBG: Recent Labs  Lab 01/20/20 1148 01/20/20 1647 01/20/20 2014 01/21/20 0735 01/21/20 1108  GLUCAP 262* 185* 253* 201* 248*    Signed:  Barton Dubois MD.  [facility] 01/21/2020, 4:03 PM

## 2020-01-21 NOTE — Plan of Care (Signed)

## 2020-01-21 NOTE — Care Management Important Message (Signed)
Important Message  Patient Details  Name: Vincent Cooper MRN: [id number] Date of Birth: 1941-10-17   Medicare Important Message Given:  Yes(pt unable to sign, copy mailed to daughter at [address] [place], [place] [address])     Tommy Medal 01/21/2020, 3:59 PM

## 2020-01-21 NOTE — Progress Notes (Signed)
Nsg Discharge Note  Admit Date:  01/19/2020 Discharge date: 01/21/2020   Vincent Cooper to be D/C'd home per MD order.  AVS completed.  Copy for chart, and copy for patient signed, and dated. Patient/caregiver able to verbalize understanding.  Discharge Medication: Allergies as of 01/21/2020      Reactions   Other    Patient reports he was allergic to something in an IV he was given but does not know what the substance was. As of 01/19/2020       Medication List    STOP taking these medications   metoprolol tartrate 50 MG tablet Commonly known as: LOPRESSOR   predniSONE 10 MG (21) Tbpk tablet Commonly known as: STERAPRED UNI-PAK 21 TAB Replaced by: predniSONE 20 MG tablet     TAKE these medications   albuterol 108 (90 Base) MCG/ACT inhaler Commonly known as: VENTOLIN HFA Inhale 1 puff into the lungs every 6 (six) hours as needed for wheezing or shortness of breath.   allopurinol 300 MG tablet Commonly known as: ZYLOPRIM Take 300 mg by mouth daily.   apixaban 5 MG Tabs tablet Commonly known as: ELIQUIS Take 1 tablet (5 mg total) by mouth 2 (two) times daily.   budesonide-formoterol 160-4.5 MCG/ACT inhaler Commonly known as: SYMBICORT Inhale 2 puffs into the lungs in the morning and at bedtime. What changed: when to take this   diltiazem 180 MG 24 hr capsule Commonly known as: Cardizem CD Take 1 capsule (180 mg total) by mouth daily.   doxycycline 100 MG tablet Commonly known as: VIBRA-TABS Take 1 tablet (100 mg total) by mouth 2 (two) times daily for 5 days.   furosemide 40 MG tablet Commonly known as: LASIX Take 1 tablet (40 mg total) by mouth daily. OR AS DIRECTED   guaiFENesin 600 MG 12 hr tablet Commonly known as: MUCINEX Take 1 tablet (600 mg total) by mouth 2 (two) times daily.   lisinopril 40 MG tablet Commonly known as: ZESTRIL Take 0.5 tablets (20 mg total) by mouth daily.   metFORMIN 500 MG tablet Commonly known as: GLUCOPHAGE Take 500 mg by mouth 2  (two) times daily with a meal.   nitroGLYCERIN 0.4 MG SL tablet Commonly known as: NITROSTAT Place 1 tablet (0.4 mg total) under the tongue every 5 (five) minutes as needed for chest pain.   predniSONE 20 MG tablet Commonly known as: Deltasone Take 3 tablets daily x1 day; then 2 tablet by mouth daily x2 day; then 1 tablet by mouth daily x3 days; then half tablet by mouth daily x3 days and stop prednisone. Replaces: predniSONE 10 MG (21) Tbpk tablet       Discharge Assessment: Vitals:   01/21/20 1353 01/21/20 1409  BP:  107/62  Pulse:  79  Resp:  20  Temp:  97.9 F (36.6 C)  SpO2: 94% 96%   Skin clean, dry and intact without evidence of skin break down, no evidence of skin tears noted. IV catheter discontinued intact. Site without signs and symptoms of complications - no redness or edema noted at insertion site, patient denies c/o pain - only slight tenderness at site.  Dressing with slight pressure applied.  D/c Instructions-Education: Discharge instructions given to patient/family with verbalized understanding. D/c education completed with patient/family including follow up instructions, medication list, d/c activities limitations if indicated, with other d/c instructions as indicated by MD - patient able to verbalize understanding, all questions fully answered. Patient instructed to return to ED, call 911, or call MD for  any changes in condition.  Patient escorted via [place], and D/C home via private auto.  Zachery Conch, RN 01/21/2020 5:21 PM

## 2020-01-24 ENCOUNTER — Encounter: Payer: Self-pay | Admitting: Cardiology

## 2020-01-24 NOTE — Assessment & Plan Note (Signed)
Recent diagnosis with multiple segmental subsegmental PE.  Has been sedentary, but no other causative factor besides a long car ride.  Is on Eliquis without any bleeding issues.  Concern for multiple different PEs with OSA and obesity that he could potentially have symptoms of CTEPH.  Echocardiogram did suggest moderately elevated PA pressures but only 40 mmHg.  We are doing coronary CTA evaluation for potential significant CAD.  If cardiac catheterization is warranted, would have low threshold to consider right and left heart cath.

## 2020-01-24 NOTE — Assessment & Plan Note (Signed)
Blood pressures well controlled.  However he is pretty tachycardic. Also has chronic cough.  May want to consider converting from ACE inhibitor. Plan: We will cut lisinopril dose to 20 mg daily and start moderate dose diltiazem (with the thought of possible pulmonary pretension treatment). We will also add moderate-dose Lasix for blood pressure and dyspnea.

## 2020-01-25 NOTE — Assessment & Plan Note (Signed)
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  He does have a systolic ejection murmur.

## 2020-01-25 NOTE — Assessment & Plan Note (Signed)
Low threshold to consider Farxiga, but with pretty well controlled A1c, Metformin is reasonable.

## 2020-01-25 NOTE — Assessment & Plan Note (Signed)
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  Echo did show evidence of diastolic dysfunction, but not significant.  Only mild left atrial enlargement would argue against significant diastolic dysfunction.  Plan:   Reduce lisinopril and start diltiazem XT (avoiding beta-blocker because of COPD) given rapid heart rate.  Add Lasix 40 mg daily and as needed.  Again, if ischemic evaluation suggest need for cardiac catheterization, would have low threshold to plan right and left heart cath.

## 2020-01-25 NOTE — Assessment & Plan Note (Signed)
Clearly 1 component of his dyspnea is his obesity.  We need to exclude ischemia so he can proceed with trying to exercise to lose weight.

## 2020-01-25 NOTE — Assessment & Plan Note (Signed)
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  Due for pulmonary evaluation, but is opposed to using CPAP, suggest maybe he needs oxygen. As reportedly had oxygen desaturations although not always.

## 2020-01-25 NOTE — Assessment & Plan Note (Signed)
He has baseline dyspnea along exertional dyspnea.  Echocardiogram does not provide enough evidence to suggest this is heart failure related or pulmonary pretension related. With associated chest discomfort, will assess for ischemia with coronary CT angiogram.

## 2020-01-25 NOTE — Assessment & Plan Note (Addendum)
On Symbicort and albuterol.  PCP has also referred for pulmonary evaluation.

## 2020-01-25 NOTE — Assessment & Plan Note (Signed)
Chest discomfort associate with exertional dyspnea.  Symptom based on the amount of dyspnea he has as well as coronary artery calcification seen on CT scan, I do think ischemic evaluation is warranted.  He would not be to walk on treadmill for any reasonable amount of time or for to be an accurate study.  As such, I think imaging studies warranted, and with obesity, coronary CTA is better option than Myoview.  Plan: Coronary artery CT angiogram with CT FFR.

## 2020-02-17 ENCOUNTER — Telehealth: Payer: Self-pay | Admitting: Cardiology

## 2020-02-17 NOTE — Telephone Encounter (Signed)
Forwarded to [facility].

## 2020-02-17 NOTE — Telephone Encounter (Signed)
Spoke to daughter. She states  Patient heart is mid 90's to 115. No symptoms otherwise.  patient is taking diltiazem 180 mg daily .  today's blood pressure 127/70's After patient last office visit - he was admitted to hospital for 3 days .   is schedule for CCTA on 03/07/20/     Patient is not on oxygen once he left hospital the last tome his O2 sat was 97%  And did not dropp so he was not a candidate for oxygen.   RN informed daughter to keep a monitor on Heart rate .  Dr [name] would like patient to keep CCTA  appointment . The information obtained from test  will be helpful.to get more information  Of pateint's heart.  daughter verbalized understanding  And aware will defer to Dr [name]

## 2020-02-17 NOTE — Telephone Encounter (Signed)
leFt message to call back    If no other  Symptoms - continue to monitorr heart rate .   Patient is schedule to CCTA  03/07/20 and follow up with Dr [name] 03/20/20

## 2020-02-17 NOTE — Telephone Encounter (Signed)
STAT if HR is under 50 or over 120 (normal HR is 60-100 beats per minute)  1) What is your heart rate? 95  2) Do you have a log of your heart rate readings (document readings)?   Highest is 105 lowest is 95  3) Do you have any other symptoms? Fatigue & SOB   [name] is calling stating Kharon's has been having an elevated HR for the past 2-3 days. She states it doesn't go over 105 and doesn't go lower than 95 even while resting. She states no other symptoms occur and all other vitals are good. Please advise.

## 2020-02-17 NOTE — Telephone Encounter (Signed)
Follow up ° ° °Patient's daughter is returning your call. Please call. °

## 2020-02-21 ENCOUNTER — Telehealth: Payer: Self-pay | Admitting: Cardiology

## 2020-02-21 NOTE — Telephone Encounter (Signed)
Looks like he was admitted with a COPD exacerbation.  We do need to slow his heart rate down some, especially for CCTA  Lets have him increase to 2 tablets of diltiazem daily x1 week.  Monitor blood pressures and heart rate.  As long as blood pressures stay stable over [id number] mmHg systolic, we can change prescription to 360 mg.  Glenetta Hew, MD

## 2020-02-22 NOTE — Telephone Encounter (Signed)
Left message to call  Back with instruction

## 2020-02-23 NOTE — Telephone Encounter (Signed)
[name] called back . Instruction given to daughter [name] .   She states she will call patient and have him start  The increase of diltiazem  2 tablets of 180 mg ( total of 360 mg)  May take at the same time.  Marland Kitchen   aware to call next Thursday 03/02/20 or Friday 03/03/20  with blood pressure recordings and heart rate . If blood pressure is consistently above 110.  medication will be change to 360 mg tablet daily .

## 2020-03-01 LAB — BASIC METABOLIC PANEL
BUN/Creatinine Ratio: 20 (ref 10–24)
BUN: 18 mg/dL (ref 8–27)
CO2: 24 mmol/L (ref 20–29)
Calcium: 9.3 mg/dL (ref 8.6–10.2)
Chloride: 103 mmol/L (ref 96–106)
Creatinine, Ser: 0.9 mg/dL (ref 0.76–1.27)
GFR calc Af Amer: 95 mL/min/{1.73_m2} (ref >59)
GFR calc non Af Amer: 82 mL/min/{1.73_m2} (ref >59)
Glucose: 110 mg/dL — ABNORMAL HIGH (ref 65–99)
Potassium: 4.4 mmol/L (ref 3.5–5.2)
Sodium: 144 mmol/L (ref 134–144)

## 2020-03-02 ENCOUNTER — Encounter (HOSPITAL_COMMUNITY): Payer: Self-pay

## 2020-03-02 ENCOUNTER — Observation Stay (HOSPITAL_COMMUNITY)
Admission: EM | Admit: 2020-03-02 | Discharge: 2020-03-03 | Disposition: A | Discharge status: Home / Self Care | Payer: Medicare PPO | Attending: Family Medicine | Admitting: Family Medicine

## 2020-03-02 ENCOUNTER — Other Ambulatory Visit: Payer: Self-pay

## 2020-03-02 ENCOUNTER — Emergency Department (HOSPITAL_COMMUNITY): Payer: Medicare PPO

## 2020-03-02 DIAGNOSIS — Z7984 Long term (current) use of oral hypoglycemic drugs: Secondary | ICD-10-CM | POA: Insufficient documentation

## 2020-03-02 DIAGNOSIS — Z87891 Personal history of nicotine dependence: Secondary | ICD-10-CM | POA: Diagnosis not present

## 2020-03-02 DIAGNOSIS — Z20822 Contact with and (suspected) exposure to covid-19: Secondary | ICD-10-CM | POA: Diagnosis not present

## 2020-03-02 DIAGNOSIS — I5032 Chronic diastolic (congestive) heart failure: Secondary | ICD-10-CM | POA: Diagnosis not present

## 2020-03-02 DIAGNOSIS — E119 Type 2 diabetes mellitus without complications: Secondary | ICD-10-CM | POA: Diagnosis not present

## 2020-03-02 DIAGNOSIS — J441 Chronic obstructive pulmonary disease with (acute) exacerbation: Principal | ICD-10-CM | POA: Insufficient documentation

## 2020-03-02 DIAGNOSIS — I110 Hypertensive heart disease with heart failure: Secondary | ICD-10-CM | POA: Insufficient documentation

## 2020-03-02 DIAGNOSIS — I10 Essential (primary) hypertension: Secondary | ICD-10-CM | POA: Diagnosis present

## 2020-03-02 DIAGNOSIS — Z7901 Long term (current) use of anticoagulants: Secondary | ICD-10-CM | POA: Insufficient documentation

## 2020-03-02 DIAGNOSIS — G4733 Obstructive sleep apnea (adult) (pediatric): Secondary | ICD-10-CM | POA: Diagnosis present

## 2020-03-02 LAB — CBC
HCT: 42 % (ref 39.0–52.0)
Hemoglobin: 13.6 g/dL (ref 13.0–17.0)
MCH: 33.4 pg (ref 26.0–34.0)
MCHC: 32.4 g/dL (ref 30.0–36.0)
MCV: 103.2 fL — ABNORMAL HIGH (ref 80.0–100.0)
Platelets: 254 10*3/uL (ref 150–400)
RBC: 4.07 MIL/uL — ABNORMAL LOW (ref 4.22–5.81)
RDW: 15.4 % (ref 11.5–15.5)
WBC: 12.4 10*3/uL — ABNORMAL HIGH (ref 4.0–10.5)
nRBC: 0 % (ref 0.0–0.2)

## 2020-03-02 LAB — BASIC METABOLIC PANEL
Anion gap: 12 (ref 5–15)
BUN: 26 mg/dL — ABNORMAL HIGH (ref 8–23)
CO2: 27 mmol/L (ref 22–32)
Calcium: 9.3 mg/dL (ref 8.9–10.3)
Chloride: 100 mmol/L (ref 98–111)
Creatinine, Ser: 0.97 mg/dL (ref 0.61–1.24)
GFR calc Af Amer: >60 mL/min (ref >60)
GFR calc non Af Amer: >60 mL/min (ref >60)
Glucose, Bld: 140 mg/dL — ABNORMAL HIGH (ref 70–99)
Potassium: 4.2 mmol/L (ref 3.5–5.1)
Sodium: 139 mmol/L (ref 135–145)

## 2020-03-02 LAB — RESPIRATORY PANEL BY RT PCR (FLU A&B, COVID)
Influenza A by PCR: NEGATIVE
Influenza B by PCR: NEGATIVE
SARS Coronavirus 2 by RT PCR: NEGATIVE

## 2020-03-02 LAB — TROPONIN I (HIGH SENSITIVITY)
Troponin I (High Sensitivity): 5 ng/L (ref <18)
Troponin I (High Sensitivity): 5 ng/L (ref <18)

## 2020-03-02 LAB — CBG MONITORING, ED
Glucose-Capillary: 220 mg/dL — ABNORMAL HIGH (ref 70–99)
Glucose-Capillary: 245 mg/dL — ABNORMAL HIGH (ref 70–99)

## 2020-03-02 LAB — GLUCOSE, CAPILLARY: Glucose-Capillary: 236 mg/dL — ABNORMAL HIGH (ref 70–99)

## 2020-03-02 LAB — BRAIN NATRIURETIC PEPTIDE: B Natriuretic Peptide: 19 pg/mL (ref 0.0–100.0)

## 2020-03-02 MED ORDER — NITROGLYCERIN 0.4 MG SL SUBL: 0.4000 mg | SUBLINGUAL_TABLET | SUBLINGUAL | Status: DC | PRN

## 2020-03-02 MED ORDER — FUROSEMIDE 40 MG PO TABS
40.0000 mg | ORAL_TABLET | Freq: Every day | ORAL | Status: DC
  Administered 2020-03-03: 40 mg via ORAL
  Filled 2020-03-02: qty 1

## 2020-03-02 MED ORDER — LISINOPRIL 10 MG PO TABS
20.0000 mg | ORAL_TABLET | Freq: Every day | ORAL | Status: DC
  Filled 2020-03-02: qty 2

## 2020-03-02 MED ORDER — IPRATROPIUM-ALBUTEROL 0.5-2.5 (3) MG/3ML IN SOLN
3.0000 mL | Freq: Four times a day (QID) | RESPIRATORY_TRACT | Status: DC
  Administered 2020-03-03 (×3): 3 mL via RESPIRATORY_TRACT
  Filled 2020-03-02 (×4): qty 3

## 2020-03-02 MED ORDER — METHYLPREDNISOLONE SODIUM SUCC 40 MG IJ SOLR
40.0000 mg | Freq: Four times a day (QID) | INTRAMUSCULAR | Status: AC
  Administered 2020-03-02 – 2020-03-03 (×4): 40 mg via INTRAVENOUS
  Filled 2020-03-02 (×4): qty 1

## 2020-03-02 MED ORDER — INSULIN ASPART 100 UNIT/ML ~~LOC~~ SOLN
0.0000 [IU] | Freq: Every day | SUBCUTANEOUS | Status: DC
  Administered 2020-03-02: 3 [IU] via SUBCUTANEOUS

## 2020-03-02 MED ORDER — MOMETASONE FURO-FORMOTEROL FUM 200-5 MCG/ACT IN AERO
2.0000 | INHALATION_SPRAY | Freq: Two times a day (BID) | RESPIRATORY_TRACT | Status: DC
  Filled 2020-03-02: qty 8.8

## 2020-03-02 MED ORDER — DILTIAZEM HCL ER COATED BEADS 180 MG PO CP24
180.0000 mg | ORAL_CAPSULE | Freq: Every day | ORAL | Status: DC
  Administered 2020-03-03: 180 mg via ORAL
  Filled 2020-03-02: qty 1

## 2020-03-02 MED ORDER — ALLOPURINOL 300 MG PO TABS
300.0000 mg | ORAL_TABLET | Freq: Every day | ORAL | Status: DC
  Administered 2020-03-03: 300 mg via ORAL
  Filled 2020-03-02: qty 1

## 2020-03-02 MED ORDER — PREDNISONE 20 MG PO TABS: 40.0000 mg | ORAL_TABLET | Freq: Every day | ORAL | Status: DC

## 2020-03-02 MED ORDER — IPRATROPIUM-ALBUTEROL 0.5-2.5 (3) MG/3ML IN SOLN: 3.0000 mL | Freq: Four times a day (QID) | RESPIRATORY_TRACT | Status: DC | PRN

## 2020-03-02 MED ORDER — IPRATROPIUM-ALBUTEROL 0.5-2.5 (3) MG/3ML IN SOLN
3.0000 mL | Freq: Once | RESPIRATORY_TRACT | Status: AC
  Administered 2020-03-02: 3 mL via RESPIRATORY_TRACT
  Filled 2020-03-02: qty 3

## 2020-03-02 MED ORDER — IPRATROPIUM-ALBUTEROL 0.5-2.5 (3) MG/3ML IN SOLN
3.0000 mL | Freq: Four times a day (QID) | RESPIRATORY_TRACT | Status: DC
  Administered 2020-03-02 – 2020-03-03 (×2): 3 mL via RESPIRATORY_TRACT
  Filled 2020-03-02 (×2): qty 3

## 2020-03-02 MED ORDER — APIXABAN 5 MG PO TABS
5.0000 mg | ORAL_TABLET | Freq: Two times a day (BID) | ORAL | Status: DC
  Administered 2020-03-02 – 2020-03-03 (×2): 5 mg via ORAL
  Filled 2020-03-02 (×2): qty 1

## 2020-03-02 MED ORDER — GUAIFENESIN ER 600 MG PO TB12
600.0000 mg | ORAL_TABLET | Freq: Two times a day (BID) | ORAL | Status: DC
  Administered 2020-03-02 – 2020-03-03 (×2): 600 mg via ORAL
  Filled 2020-03-02 (×2): qty 1

## 2020-03-02 MED ORDER — ALBUTEROL SULFATE HFA 108 (90 BASE) MCG/ACT IN AERS
4.0000 | INHALATION_SPRAY | Freq: Once | RESPIRATORY_TRACT | Status: AC
  Administered 2020-03-02: 13:00:00 4 via RESPIRATORY_TRACT
  Filled 2020-03-02: qty 6.7

## 2020-03-02 MED ORDER — INSULIN ASPART 100 UNIT/ML ~~LOC~~ SOLN
0.0000 [IU] | Freq: Three times a day (TID) | SUBCUTANEOUS | Status: DC
  Administered 2020-03-02: 5 [IU] via SUBCUTANEOUS
  Filled 2020-03-02: qty 1

## 2020-03-02 NOTE — ED Triage Notes (Signed)
EMS reports pt started diltiazem last month.  Reports sob and swelling to lower extremities since last night.  EMS says when they arrived pt was wheezing on r side and o2 sat was 93% on room air.  Reports they gave him a duoneb, sat increased to 100% but then heard wheezing on both sides.  Reports worsening sob with exertion.  EMS also gave 125mg  solumedrol pta.  CBG 109, 129/68, HR 89, rr 22.  PT alert and oriented.

## 2020-03-02 NOTE — ED Notes (Signed)
Pt up to void at bedside without O2, sat 90% with increased labored breathing, O2 reapplied and O2 sat up to 93% on 4L

## 2020-03-02 NOTE — ED Provider Notes (Signed)
Emergency Department Provider Note   I have reviewed the triage vital signs and the nursing notes.   HISTORY  Chief Complaint Shortness of Breath   HPI Vincent Cooper is a 79 y.o. male with past medical history reviewed below including COPD, OSA, prior PE on Eliquis presents to the emergency department with progressively worsening shortness of breath over the past several days.  He denies fevers, chills.  He does have cough.  He feels improved with sitting up and worse with lying back.  He denies significant swelling in his legs.  Has been trying his albuterol inhaler and nebulizer machines at home but no lasting relief in symptoms.  Patient was last admitted in March 2021 with COPD exacerbation.  With symptoms worsening this morning he called EMS.  Lowest oxygen sat with them was 93% on room air.  They placed him on 2 L nasal cannula for comfort with increased work of breathing.  He was given a DuoNeb as well as 125 mg of Solu-Medrol and transported to the emergency department.  Patient states that mostly at rest his symptoms are manageable but when he ambulates even very short distances he becomes very short of breath. No CP.    Past Medical History:  Diagnosis Date  . Chronic gout   . COPD (chronic obstructive pulmonary disease) ([place])    Oxygen dependent  . Diabetes mellitus type II, non insulin dependent ([place])   . Hypertension   . Morbid obesity ([facility])    BMI of 38.5 with multiple risk factors.  . OSA on CPAP   . Pulmonary emboli ([place]) 10/2019   Chest CTA-4 [place] opacification of main PA but there is partially occlusive main posterior RLL and segmental/segmental branches.  Small thrombus noted in the anterior right middle lobe.  No RV strain.  Scattered aortic atherosclerosis involving great vessels.  Coronary calcification noted.  Marland Kitchen RLS (restless legs syndrome)     Patient Active Problem List   Diagnosis Date Noted  . Acute respiratory failure with hypoxia ([place])   .  Bronchiectasis with acute exacerbation ([place])   . Chronic diastolic HF (heart failure) ([place])   . Chest pain with moderate risk for cardiac etiology 01/19/2020  . Shortness of breath 01/19/2020  . Morbid obesity ([place]) 01/19/2020  . COPD with acute exacerbation ([place]) 01/19/2020  . Cardiac murmur 11/07/2019  . COPD (chronic obstructive pulmonary disease) ([place]) 11/07/2019  . Diabetes mellitus ([place]) 11/07/2019  . OSA and COPD overlap syndrome ([place]) 11/07/2019  . Essential hypertension 11/07/2019  . Single subsegmental pulmonary embolism without acute cor pulmonale ([place]) 11/06/2019    Past Surgical History:  Procedure Laterality Date  . Cataract surgery Right     Allergies Other  Family History  Problem Relation Age of Onset  . CAD Neg Hx   . Diabetes Neg Hx     Social History Social History   Tobacco Use  . Smoking status: Former Research scientist (life sciences)  . Smokeless tobacco: Former Network engineer Use Topics  . Alcohol use: Not Currently  . Drug use: Never    Review of Systems  Constitutional: No fever/chills Eyes: No visual changes. ENT: No sore throat. Cardiovascular: Denies chest pain. Question LE edema per EMS.  Respiratory: Positive shortness of breath. Gastrointestinal: No abdominal pain.  No nausea, no vomiting.  No diarrhea.  No constipation. Genitourinary: Negative for dysuria. Musculoskeletal: Negative for back pain. Skin: Negative for rash. Neurological: Negative for headaches, focal weakness or numbness.  10-point ROS otherwise negative.  ____________________________________________  PHYSICAL EXAM:  VITAL SIGNS: ED Triage Vitals  Enc Vitals Group     BP 03/02/20 1213 139/62     Pulse Rate 03/02/20 1212 94     Resp 03/02/20 1212 (!) 28     Temp 03/02/20 1213 98.9 F (37.2 C)     Temp Source 03/02/20 1213 Oral     SpO2 03/02/20 1212 100 %     Weight 03/02/20 1208 260 lb (117.9 kg)     Height 03/02/20 1208 5\' 9"  (1.753 m)   Constitutional: Alert and  oriented. Well appearing and in no acute distress. Eyes: Conjunctivae are normal.  Head: Atraumatic. Nose: No congestion/rhinnorhea. Mouth/Throat: Mucous membranes are moist. Neck: No stridor.   Cardiovascular: Normal rate, regular rhythm. Good peripheral circulation. Grossly normal heart sounds.   Respiratory: Increased respiratory effort.  No retractions. Lungs with end-expiratory wheezing bilaterally. No rales.  Gastrointestinal: Soft and nontender. No distention.  Musculoskeletal: No lower extremity tenderness with trace bilateral pitting edema. No gross deformities of extremities.  Neurologic:  Normal speech and language. No gross focal neurologic deficits are appreciated.  Skin:  Skin is warm, dry and intact. No rash noted.  ____________________________________________   LABS (all labs ordered are listed, but only abnormal results are displayed)  Labs Reviewed  BASIC METABOLIC PANEL - Abnormal; Notable for the following components:      Result Value   Glucose, Bld 140 (*)    BUN 26 (*)    All other components within normal limits  CBC - Abnormal; Notable for the following components:   WBC 12.4 (*)    RBC 4.07 (*)    MCV 103.2 (*)    All other components within normal limits  CBG MONITORING, ED - Abnormal; Notable for the following components:   Glucose-Capillary 220 (*)    All other components within normal limits  RESPIRATORY PANEL BY RT PCR (FLU A&B, COVID)  BRAIN NATRIURETIC PEPTIDE  TROPONIN I (HIGH SENSITIVITY)  TROPONIN I (HIGH SENSITIVITY)   ____________________________________________  EKG   EKG Interpretation  Date/Time:  Thursday March 02 2020 12:12:22 EDT Ventricular Rate:  92 PR Interval:    QRS Duration: 112 QT Interval:  359 QTC Calculation: 445 R Axis:   65 Text Interpretation: Sinus tachycardia Paired ventricular premature complexes Borderline intraventricular conduction delay Low voltage, precordial leads No STEMI Confirmed by Nanda Quinton  [phone]) on 03/02/2020 12:34:45 PM       ____________________________________________  RADIOLOGY  DG Chest Portable 1 View  Result Date: 03/02/2020 CLINICAL DATA:  Acute onset of shortness of breath and BILATERAL lower extremity edema that began last night. Wheezing on clinical examination. EXAM: PORTABLE CHEST 1 VIEW COMPARISON:  01/19/2020 and earlier, including CTA chest 11/06/2019. FINDINGS: Cardiac silhouette upper normal in size to slightly enlarged for AP portable technique, unchanged. Thoracic aorta atherosclerotic, unchanged. Prominent mediastinal fat in the inferior mediastinum as noted on the prior CT accounts for the apparent mass in the lower mediastinum. Mild pulmonary venous hypertension without overt edema. Lungs clear. No visible pleural effusions. IMPRESSION: Stable borderline to mild cardiomegaly. No acute cardiopulmonary disease. Electronically Signed   By: Evangeline Dakin M.D.   On: 03/02/2020 13:01    ____________________________________________   PROCEDURES  Procedure(s) performed:   Procedures  CRITICAL CARE Performed by: Margette Fast Total critical care time: 35 minutes Critical care time was exclusive of separately billable procedures and treating other patients. Critical care was necessary to treat or prevent imminent or life-threatening deterioration. Critical care was time  spent personally by me on the following activities: development of treatment plan with patient and/or surrogate as well as nursing, discussions with consultants, evaluation of patient's response to treatment, examination of patient, obtaining history from patient or surrogate, ordering and performing treatments and interventions, ordering and review of laboratory studies, ordering and review of radiographic studies, pulse oximetry and re-evaluation of patient's condition.  Nanda Quinton, MD Emergency Medicine  ____________________________________________   INITIAL IMPRESSION /  ASSESSMENT AND PLAN / ED COURSE  Pertinent labs & imaging results that were available during my care of the patient were reviewed by me and considered in my medical decision making (see chart for details).   Patient presents to the emergency department for evaluation of progressively worsening shortness of breath.  There is report in the nursing note of swelling in the lower extremities which appears to be very mild on my exam.  He mainly has wheezing on exam with some mild to moderate tachypnea here.  He is received Solu-Medrol and DuoNeb prior to arrival.  Will give additional albuterol and obtain screening blood work.  Chest x-ray shows stable, mild cardiomegaly without pulmonary edema.  No infiltrates.  Doubt PE as patient has been compliant with his anticoagulation and is not experiencing chest pain.   CXR and labs reviewed. No consistent hypoxemia but subjective improved on [place] O2. Patient becomes very dyspneic on ambulation around the room per nursing note. Wheezing noted on exam. Patient dyspnea likely multifactorial but seems most affected by COPD exacerbation. Plan for additional nebs and admit.   Discussed patient's case with [facility] to request admission. Patient and family (if present) updated with plan. Care transferred to [facility] service.  I reviewed all nursing notes, vitals, pertinent old records, EKGs, labs, imaging (as available).  ____________________________________________  FINAL CLINICAL IMPRESSION(S) / ED DIAGNOSES  Final diagnoses:  COPD exacerbation ([place])     MEDICATIONS GIVEN DURING THIS VISIT:  Medications  insulin aspart (novoLOG) injection 0-15 Units (has no administration in time range)  insulin aspart (novoLOG) injection 0-5 Units (has no administration in time range)  ipratropium-albuterol (DUONEB) 0.5-2.5 (3) MG/3ML nebulizer solution 3 mL (has no administration in time range)  ipratropium-albuterol (DUONEB) 0.5-2.5 (3) MG/3ML nebulizer solution 3 mL (has no  administration in time range)  albuterol (VENTOLIN HFA) 108 (90 Base) MCG/ACT inhaler 4 puff (4 puffs Inhalation Given 03/02/20 1248)  ipratropium-albuterol (DUONEB) 0.5-2.5 (3) MG/3ML nebulizer solution 3 mL (3 mLs Nebulization Given 03/02/20 1539)     Note:  This document was prepared using Dragon voice recognition software and may include unintentional dictation errors.  Nanda Quinton, MD, [facility] Emergency Medicine    Dorleen Kissel, [facility], MD 03/02/20 [phone]

## 2020-03-02 NOTE — H&P (Signed)
TRH H&P   Patient Demographics:    Vincent Cooper, is a 79 y.o. male  MRN: [id number]   DOB - Mar 31, 1941  Admit Date - 03/02/2020  Outpatient Primary MD for the patient is [name], DO  Referring MD/NP/PA: DR Long  Patient coming from: Home  Chief Complaint  Patient presents with  . Shortness of Breath      HPI:    Vincent Cooper  is a 79 y.o. 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  He has some dyspnea at baseline, significantly worsened this morning, especially with activity, as well reports cough, with a productive white phlegm, he denies fever, chills, no chest pain, dysuria or polyuria, he was noted to be significantly dyspneic and neck by EMS he was given 125 mg of IV Solu-Medrol, and nebulizer treatment with improvement of his symptoms, he remains with significant wheezing and dyspnea with activity in ED, there is no hypoxia though, chest x-ray with cardiomegaly, but no volume overload or infectious process, COVID 19 WAS NEGATIVE  hospitalist was consulted to admit.    Review of systems:    In addition to the HPI above,  No Fever-chills, No Headache, No changes with Vision or hearing, No problems swallowing food or Liquids, No Chest pain, ports cough and shortness of breath, as well report wheezing No Abdominal pain, No Nausea or Vommitting, Bowel movements are regular, No Blood in stool or Urine, No dysuria, No new skin rashes or bruises, No new joints pains-aches,  No new weakness, tingling, numbness in any extremity, No recent weight gain or loss, No polyuria, polydypsia or polyphagia, No significant Mental Stressors.  A full 10 point Review of Systems was done, except as stated above, all other Review of Systems were  negative.   With Past History of the following :    Past Medical History:  Diagnosis Date  . Chronic gout   . COPD (chronic obstructive pulmonary disease) ([place])    Oxygen dependent  . Diabetes mellitus type II, non insulin dependent ([place])   . Hypertension   . Morbid obesity (HCC)    BMI of 38.5 with multiple risk factors.  . OSA on CPAP   . Pulmonary emboli ([place]) 10/2019   Chest CTA-4 [place] opacification of main PA but there is partially occlusive main posterior RLL and segmental/segmental branches.  Small thrombus noted in the anterior right middle lobe.  No RV strain.  Scattered aortic atherosclerosis involving great vessels.  Coronary calcification noted.  Marland Kitchen RLS (restless legs syndrome)       Past Surgical History:  Procedure Laterality Date  . Cataract surgery Right       Social History:     Social History   Tobacco Use  . Smoking status: Former Research scientist (life sciences)  . Smokeless tobacco:  Former Systems developer  Substance Use Topics  . Alcohol use: Not Currently      Family History :     Family History  Problem Relation Age of Onset  . CAD Neg Hx   . Diabetes Neg Hx       Home Medications:   Prior to Admission medications   Medication Sig Start Date End Date Taking? Authorizing Provider  albuterol (VENTOLIN HFA) 108 (90 Base) MCG/ACT inhaler Inhale 1 puff into the lungs every 6 (six) hours as needed for wheezing or shortness of breath.   Yes [provider]  allopurinol (ZYLOPRIM) 300 MG tablet Take 300 mg by mouth daily.   Yes [provider]  apixaban (ELIQUIS) 5 MG TABS tablet Take 1 tablet (5 mg total) by mouth 2 (two) times daily. 01/21/20  Yes Barton Dubois, MD  budesonide-formoterol [facility]) 160-4.5 MCG/ACT inhaler Inhale 2 puffs into the lungs in the morning and at bedtime. 01/21/20  Yes Barton Dubois, MD  diltiazem (CARDIZEM CD) 180 MG 24 hr capsule Take 1 capsule (180 mg total) by mouth daily. 01/19/20  Yes Leonie Man, MD  furosemide (LASIX) 40 MG  tablet Take 1 tablet (40 mg total) by mouth daily. OR AS DIRECTED 01/19/20 04/18/20 Yes Leonie Man, MD  guaiFENesin (MUCINEX) 600 MG 12 hr tablet Take 1 tablet (600 mg total) by mouth 2 (two) times daily. 01/21/20  Yes Barton Dubois, MD  ipratropium-albuterol (DUONEB) 0.5-2.5 (3) MG/3ML SOLN Take 3 mLs by nebulization every 6 (six) hours as needed. 01/28/20  Yes [provider]  lisinopril (ZESTRIL) 40 MG tablet Take 0.5 tablets (20 mg total) by mouth daily. 01/19/20  Yes Leonie Man, MD  metFORMIN (GLUCOPHAGE) 500 MG tablet Take 500 mg by mouth 2 (two) times daily with a meal.   Yes [provider]  nitroGLYCERIN (NITROSTAT) 0.4 MG SL tablet Place 1 tablet (0.4 mg total) under the tongue every 5 (five) minutes as needed for chest pain. 01/19/20 04/18/20 Yes Leonie Man, MD  predniSONE (DELTASONE) 20 MG tablet Take 3 tablets daily x1 day; then 2 tablet by mouth daily x2 day; then 1 tablet by mouth daily x3 days; then half tablet by mouth daily x3 days and stop prednisone. Patient not taking: Reported on 03/02/2020 01/21/20   Barton Dubois, MD     Allergies:     Allergies  Allergen Reactions  . Other     Patient reports he was allergic to something in an IV he was given but does not know what the substance was. As of 01/19/2020      Physical Exam:   Vitals  Blood pressure 139/62, pulse 82, temperature 98.9 F (37.2 C), temperature source Oral, resp. rate 15, height 5\' 9"  (1.753 m), weight 117.9 kg, SpO2 100 %.   1. General developed male lying in bed in NAD,    2. Normal affect and insight, Not Suicidal or Homicidal, Awake Alert, Oriented X 3.  3. No F.N deficits, ALL C.Nerves Intact, Strength 5/5 all 4 extremities, Sensation intact all 4 extremities, Plantars down going.  4. Ears and Eyes appear Normal, Conjunctivae clear, PERRLA. Moist Oral Mucosa.  5. Supple Neck, No JVD, No cervical lymphadenopathy appriciated, No Carotid Bruits.  6. Symmetrical Chest wall  movement, diminished air entry bilaterally, with diffuse wheezing and some tachypnea.  7. RRR, No Gallops, Rubs or Murmurs, No Parasternal Heave.  8. Positive Bowel Sounds, Abdomen Soft, No tenderness, No organomegaly appriciated,No rebound -guarding or rigidity.  9.  No Cyanosis, Normal Skin Turgor, No Skin Rash or Bruise.  10. Good muscle tone,  joints appear normal , no effusions, Normal ROM.  11. No Palpable Lymph Nodes in Neck or Axillae    Data Review:    CBC Recent Labs  Lab 03/02/20 1325  WBC 12.4*  HGB 13.6  HCT 42.0  PLT 254  MCV 103.2*  MCH 33.4  MCHC 32.4  RDW 15.4   ------------------------------------------------------------------------------------------------------------------  Chemistries  Recent Labs  Lab 02/29/20 1539 03/02/20 1325  NA 144 139  K 4.4 4.2  CL 103 100  CO2 24 27  GLUCOSE 110* 140*  BUN 18 26*  CREATININE 0.90 0.97  CALCIUM 9.3 9.3   ------------------------------------------------------------------------------------------------------------------ estimated creatinine clearance is 80.8 mL/min (by C-G formula based on SCr of 0.97 mg/dL). ------------------------------------------------------------------------------------------------------------------ No results for input(s): TSH, T4TOTAL, T3FREE, THYROIDAB in the last 72 hours.  Invalid input(s): FREET3  Coagulation profile No results for input(s): INR, PROTIME in the last 168 hours. ------------------------------------------------------------------------------------------------------------------- No results for input(s): DDIMER in the last 72 hours. -------------------------------------------------------------------------------------------------------------------  Cardiac Enzymes No results for input(s): CKMB, TROPONINI, MYOGLOBIN in the last 168 hours.  Invalid input(s): CK  ------------------------------------------------------------------------------------------------------------------    Component Value Date/Time   BNP 19.0 03/02/2020 1325     ---------------------------------------------------------------------------------------------------------------  Urinalysis No results found for: COLORURINE, APPEARANCEUR, LABSPEC, PHURINE, GLUCOSEU, HGBUR, BILIRUBINUR, KETONESUR, PROTEINUR, UROBILINOGEN, NITRITE, LEUKOCYTESUR  ----------------------------------------------------------------------------------------------------------------   Imaging Results:    DG Chest Portable 1 View  Result Date: 03/02/2020 CLINICAL DATA:  Acute onset of shortness of breath and BILATERAL lower extremity edema that began last night. Wheezing on clinical examination. EXAM: PORTABLE CHEST 1 VIEW COMPARISON:  01/19/2020 and earlier, including CTA chest 11/06/2019. FINDINGS: Cardiac silhouette upper normal in size to slightly enlarged for AP portable technique, unchanged. Thoracic aorta atherosclerotic, unchanged. Prominent mediastinal fat in the inferior mediastinum as noted on the prior CT accounts for the apparent mass in the lower mediastinum. Mild pulmonary venous hypertension without overt edema. Lungs clear. No visible pleural effusions. IMPRESSION: Stable borderline to mild cardiomegaly. No acute cardiopulmonary disease. Electronically Signed   By: Evangeline Dakin M.D.   On: 03/02/2020 13:01    My personal review of EKG: Rhythm NSR, no Acute ST changes   Assessment & Plan:    Active Problems:   Diabetes mellitus (HCC)   OSA and COPD overlap syndrome ([place])   Essential hypertension   Morbid obesity ([place])   COPD with acute exacerbation (HCC)   Chronic diastolic HF (heart failure) (HCC)   COPD exacerbation -Increased work of breathing, diffuse wheezing, chest x-ray were no evidence of volume overload or infectious process, continue with IV Solu-Medrol, continue scheduled  duo nebs, and continue home medications Symbicort. -Productive cough, but white in color, no indication for antibiotics.  Diabetes mellitus type 2 -hold Glucophage, will start sliding scale insulin with NovoLog.  History of pulmonary embolism -continue Eliquis  Hypertension -blood pressure is stable, continue Cardizem, Zestril 20 mg daily  Chronic diastolic CHF -Appears to be euvolemic, continue with home dose Lasix.  Gout -continue allopurinol   Morbid obesity   DVT Prophylaxis on Eliquis  AM Labs Ordered, also please review Full Orders  Family Communication: Admission, patients condition and plan of care including tests being ordered have been discussed with the patient  who indicate understanding and agree with the plan and Code Status.  Code Status Full  Likely DC to  Home  Condition GUARDED    Consults called: None  Admission status:  Observation  Time spent in minutes : 55 MINUTES   Phillips Climes M.D on 03/02/2020 at 3:29 PM  Between 7am to 7pm - Pager - [phone]. After 7pm go to [email or web address] - password [facility]  [facility] - Office  [phone]

## 2020-03-03 DIAGNOSIS — E119 Type 2 diabetes mellitus without complications: Secondary | ICD-10-CM | POA: Diagnosis not present

## 2020-03-03 DIAGNOSIS — J441 Chronic obstructive pulmonary disease with (acute) exacerbation: Secondary | ICD-10-CM | POA: Diagnosis not present

## 2020-03-03 DIAGNOSIS — G4733 Obstructive sleep apnea (adult) (pediatric): Secondary | ICD-10-CM

## 2020-03-03 DIAGNOSIS — I10 Essential (primary) hypertension: Secondary | ICD-10-CM | POA: Diagnosis not present

## 2020-03-03 DIAGNOSIS — I5032 Chronic diastolic (congestive) heart failure: Secondary | ICD-10-CM | POA: Diagnosis not present

## 2020-03-03 DIAGNOSIS — J449 Chronic obstructive pulmonary disease, unspecified: Secondary | ICD-10-CM

## 2020-03-03 LAB — GLUCOSE, CAPILLARY
Glucose-Capillary: 195 mg/dL — ABNORMAL HIGH (ref 70–99)
Glucose-Capillary: 238 mg/dL — ABNORMAL HIGH (ref 70–99)
Glucose-Capillary: 172 mg/dL — ABNORMAL HIGH (ref 70–99)

## 2020-03-03 LAB — BASIC METABOLIC PANEL
Anion gap: 12 (ref 5–15)
BUN: 26 mg/dL — ABNORMAL HIGH (ref 8–23)
CO2: 26 mmol/L (ref 22–32)
Calcium: 9.4 mg/dL (ref 8.9–10.3)
Chloride: 100 mmol/L (ref 98–111)
Creatinine, Ser: 0.88 mg/dL (ref 0.61–1.24)
GFR calc Af Amer: >60 mL/min (ref >60)
GFR calc non Af Amer: >60 mL/min (ref >60)
Glucose, Bld: 201 mg/dL — ABNORMAL HIGH (ref 70–99)
Potassium: 4.4 mmol/L (ref 3.5–5.1)
Sodium: 138 mmol/L (ref 135–145)

## 2020-03-03 LAB — CBC
HCT: 40.8 % (ref 39.0–52.0)
Hemoglobin: 13.3 g/dL (ref 13.0–17.0)
MCH: 33.3 pg (ref 26.0–34.0)
MCHC: 32.6 g/dL (ref 30.0–36.0)
MCV: 102 fL — ABNORMAL HIGH (ref 80.0–100.0)
Platelets: 250 10*3/uL (ref 150–400)
RBC: 4 MIL/uL — ABNORMAL LOW (ref 4.22–5.81)
RDW: 14.9 % (ref 11.5–15.5)
WBC: 11 10*3/uL — ABNORMAL HIGH (ref 4.0–10.5)
nRBC: 0 % (ref 0.0–0.2)

## 2020-03-03 MED ORDER — INSULIN GLARGINE 100 UNIT/ML ~~LOC~~ SOLN
15.0000 [IU] | Freq: Every day | SUBCUTANEOUS | Status: DC
  Administered 2020-03-03: 15 [IU] via SUBCUTANEOUS
  Filled 2020-03-03 (×4): qty 0.15

## 2020-03-03 MED ORDER — IPRATROPIUM-ALBUTEROL 0.5-2.5 (3) MG/3ML IN SOLN: 3.0000 mL | Freq: Three times a day (TID) | RESPIRATORY_TRACT | Status: DC

## 2020-03-03 MED ORDER — INSULIN ASPART 100 UNIT/ML ~~LOC~~ SOLN
6.0000 [IU] | Freq: Three times a day (TID) | SUBCUTANEOUS | Status: DC
  Administered 2020-03-03 (×3): 6 [IU] via SUBCUTANEOUS

## 2020-03-03 MED ORDER — INSULIN ASPART 100 UNIT/ML ~~LOC~~ SOLN: 0.0000 [IU] | Freq: Every day | SUBCUTANEOUS | Status: DC

## 2020-03-03 MED ORDER — PREDNISONE 20 MG PO TABS: 40.0000 mg | ORAL_TABLET | Freq: Every day | ORAL | 0 refills | Status: AC

## 2020-03-03 MED ORDER — INSULIN ASPART 100 UNIT/ML ~~LOC~~ SOLN
0.0000 [IU] | Freq: Three times a day (TID) | SUBCUTANEOUS | Status: DC
  Administered 2020-03-03 (×2): 4 [IU] via SUBCUTANEOUS
  Administered 2020-03-03: 12:00:00 7 [IU] via SUBCUTANEOUS

## 2020-03-03 MED ORDER — DOXYCYCLINE HYCLATE 100 MG PO CAPS: 100.0000 mg | ORAL_CAPSULE | Freq: Two times a day (BID) | ORAL | 0 refills | Status: AC

## 2020-03-03 NOTE — Plan of Care (Signed)
  Problem: Acute Rehab PT Goals(only PT should resolve) Goal: Patient Will Transfer Sit To/From Stand Outcome: Progressing Flowsheets (Taken 03/03/2020 1434) Patient will transfer sit to/from stand: with modified independence Goal: Pt Will Transfer Bed To Chair/Chair To Bed Outcome: Progressing Flowsheets (Taken 03/03/2020 1434) Pt will Transfer Bed to Chair/Chair to Bed: with modified independence Goal: Pt Will Ambulate Outcome: Progressing Flowsheets (Taken 03/03/2020 1434) Pt will Ambulate:  > 125 feet  with supervision  with least restrictive assistive device Goal: Pt/caregiver will Perform Home Exercise Program Outcome: Progressing Flowsheets (Taken 03/03/2020 1434) Pt/caregiver will Perform Home Exercise Program:  For improved balance  For increased strengthening  Pamala Hurry D. Hartnett-Rands, MS, PT Per [place] [phone] 03/03/2020

## 2020-03-03 NOTE — Evaluation (Signed)
Occupational Therapy Evaluation Patient Details Name: Vincent Cooper MRN: [id number] DOB: August 13, 1941 Today's Date: 03/03/2020    History of Present Illness Vincent Cooper  is a 79 y.o. 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  He has some dyspnea at baseline, significantly worsened this morning, especially with activity.   Clinical Impression   Pt presenting at baseline for all basic ADL tasks. Reports ability to self monitor need for rest breaks and over exertion at home. Daughter is available to assist him PRN. Verbal education provided on energy conservation techniques and strategies to use at home to decrease fatigue. Patient verbalized understanding.  No further OT services needed at this time.     Follow Up Recommendations  No OT follow up    Equipment Recommendations  None recommended by OT       Precautions / Restrictions Precautions Precautions: None Restrictions Weight Bearing Restrictions: No      Mobility Bed Mobility Overal bed mobility: Modified Independent    Transfers Overall transfer level: Modified independent Equipment used: Rolling walker (2 wheeled)                  Balance Overall balance assessment: No apparent balance deficits (not formally assessed)        ADL either performed or assessed with clinical judgement   ADL Overall ADL's : Modified independent;At baseline     General ADL Comments: Patient is able to complete all basic ADL tasks at Modified independent level. Rest breaks taken when needed. Able to self monitor and take when needed.     Vision Baseline Vision/History: No visual deficits Patient Visual Report: No change from baseline              Pertinent Vitals/Pain Pain Assessment: No/denies pain     Hand Dominance Right   Extremity/Trunk Assessment Upper Extremity  Assessment Upper Extremity Assessment: Overall WFL for tasks assessed   Lower Extremity Assessment Lower Extremity Assessment: Defer to PT evaluation       Communication Communication Communication: Other (comment)(slight hearing difficulties)   Cognition Arousal/Alertness: Awake/alert Behavior During Therapy: WFL for tasks assessed/performed Overall Cognitive Status: Within Functional Limits for tasks assessed                    Home Living Family/patient expects to be discharged to:: Private residence Living Arrangements: Alone Available Help at Discharge: Family;Available PRN/intermittently(daughter) Type of Home: House       Home Layout: One level     Bathroom Shower/Tub: Teacher, early years/pre: Standard Bathroom Accessibility: Yes How Accessible: Accessible via walker Home Equipment: Dotsero - 2 wheels;Shower seat;Adaptive equipment Adaptive Equipment: Reacher        Prior Functioning/Environment Level of Independence: Independent with assistive device(s)        Comments: Pt reports that his daughter will assist him with laundry and heavy household cleaning. He is able to prepare meals (he loves to cook), and he can do light cleaning.                       Barriers to D/C:    None          AM-PAC OT "6 Clicks" Daily Activity     Outcome Measure Help from another person eating meals?: None Help from another person taking care of personal grooming?: None Help from another person toileting,  which includes using toliet, bedpan, or urinal?: None Help from another person bathing (including washing, rinsing, drying)?: None Help from another person to put on and taking off regular upper body clothing?: None Help from another person to put on and taking off regular lower body clothing?: None 6 Click Score: 24   End of Session Equipment Utilized During Treatment: Rolling walker  Activity Tolerance: Patient tolerated treatment  well Patient left: in chair;with call bell/phone within reach  OT Visit Diagnosis: Muscle weakness (generalized) (M62.81)                Time: [id number] OT Time Calculation (min): 9 min Charges:  OT General Charges $OT Visit: 1 Visit OT Evaluation $OT Eval Low Complexity: 1 Low  Ailene Ravel, OTR/L,CBIS  [phone]   Gizel Riedlinger, Clarene Duke 03/03/2020, 9:27 AM

## 2020-03-03 NOTE — Progress Notes (Signed)
SATURATION QUALIFICATIONS: (This note is used to comply with regulatory documentation for home oxygen)  Patient Saturations on Room Air at Rest = 92%  Patient Saturations on Room Air while Ambulation=96%  Patient Saturations on 0 Liters of oxygen while Ambulating =2 L  %  Please briefly explain why patient needs home oxygen: Patient is able to maintain SPO2 > 92% on RM while at rest and with ambulation  Elodia Florence RN

## 2020-03-03 NOTE — Discharge Instructions (Signed)
Chronic Obstructive Pulmonary Disease Exacerbation  Chronic obstructive pulmonary disease (COPD) is a long-term (chronic) condition that affects the lungs. COPD is a general term that can be used to describe many different lung problems that cause lung swelling (inflammation) and limit airflow, including chronic bronchitis and emphysema. COPD exacerbations are episodes when breathing symptoms become much worse and require extra treatment. COPD exacerbations are usually caused by infections. Without treatment, COPD exacerbations can be severe and even life threatening. Frequent COPD exacerbations can cause further damage to the lungs. What are the causes? This condition may be caused by:  Respiratory infections, including viral and bacterial infections.  Exposure to smoke.  Exposure to air pollution, chemical fumes, or dust.  Things that give you an allergic reaction (allergens).  Not taking your usual COPD medicines as directed.  Underlying medical problems, such as congestive heart failure or infections not involving the lungs. In many cases, the cause (trigger) of this condition is not known. What increases the risk? The following factors may make you more likely to develop this condition:  Smoking cigarettes.  Old age.  Frequent prior COPD exacerbations. What are the signs or symptoms? Symptoms of this condition include:  Increased coughing.  Increased production of mucus from your lungs (sputum).  Increased wheezing.  Increased shortness of breath.  Rapid or labored breathing.  Chest tightness.  Less energy than usual.  Sleep disruption from symptoms.  Confusion or increased sleepiness. Often these symptoms happen or get worse even with the use of medicines. How is this diagnosed? This condition is diagnosed based on:  Your medical history.  A physical exam. You may also have tests, including:  A chest X-ray.  Blood tests.  Lung (pulmonary) function  tests. How is this treated? Treatment for this condition depends on the severity and cause of the symptoms. You may need to be admitted to a hospital for treatment. Some of the treatments commonly used to treat COPD exacerbations are:  Antibiotic medicines. These may be used for severe exacerbations caused by a lung infection, such as pneumonia.  Bronchodilators. These are inhaled medicines that expand the air passages and allow increased airflow.  Steroid medicines. These act to reduce inflammation in the airways. They may be given with an inhaler, taken by mouth, or given through an IV tube inserted into one of your veins.  Supplemental oxygen therapy.  Airway clearing techniques, such as noninvasive ventilation (NIV) and positive expiratory pressure (PEP). These provide respiratory support through a mask or other noninvasive device. An example of this would be using a continuous positive airway pressure (CPAP) machine to improve delivery of oxygen into your lungs. Follow these instructions at home: Medicines  Take over-the-counter and prescription medicines only as told by your health care provider. It is important to use correct technique with inhaled medicines.  If you were prescribed an antibiotic medicine or oral steroid, take it as told by your health care provider. Do not stop taking the medicine even if you start to feel better. Lifestyle  Eat a healthy diet.  Exercise regularly.  Get plenty of sleep.  Avoid exposure to all substances that irritate the airway, especially to tobacco smoke.  Wash your hands often with soap and water to reduce the risk of infection. If soap and water are not available, use hand sanitizer.  During flu season, avoid enclosed spaces that are crowded with people. General instructions  Drink enough fluid to keep your urine clear or pale yellow (unless you have a medical   condition that requires fluid restriction).  Use a cool mist vaporizer. This  humidifies the air and makes it easier for you to clear your chest when you cough.  If you have a home nebulizer and oxygen, continue to use them as told by your health care provider.  Keep all follow-up visits as told by your health care provider. This is important. How is this prevented?  Stay up-to-date on pneumococcal and influenza (flu) vaccines. A flu shot is recommended every year to help prevent exacerbations.  Do not use any products that contain nicotine or tobacco, such as cigarettes and e-cigarettes. Quitting smoking is very important in preventing COPD from getting worse and in preventing exacerbations from happening as often. If you need help quitting, ask your health care provider.  Follow all instructions for pulmonary rehabilitation after a recent exacerbation. This can help prevent future exacerbations.  Work with your health care provider to develop and follow an action plan. This tells you what steps to take when you experience certain symptoms. Contact a health care provider if:  You have a worsening of your regular COPD symptoms. Get help right away if:  You have worsening shortness of breath, even when resting.  You have trouble talking.  You have severe chest pain.  You cough up blood.  You have a fever.  You have weakness, vomit repeatedly, or faint.  You feel confused.  You are not able to sleep because of your symptoms.  You have trouble doing daily activities. Summary  COPD exacerbations are episodes when breathing symptoms become much worse and require extra treatment above your normal treatment.  Exacerbations can be severe and even life threatening. Frequent COPD exacerbations can cause further damage to your lungs.  COPD exacerbations are usually triggered by infections such as the flu, colds, and even pneumonia.  Treatment for this condition depends on the severity and cause of the symptoms. You may need to be admitted to a hospital for  treatment.  Quitting smoking is very important to prevent COPD from getting worse and to prevent exacerbations from happening as often. This information is not intended to replace advice given to you by your health care provider. Make sure you discuss any questions you have with your health care provider. Document Revised: 10/17/2017 Document Reviewed: 12/09/2016 Elsevier Patient Education  2020 [employer].   IMPORTANT INFORMATION: PAY CLOSE ATTENTION   PHYSICIAN DISCHARGE INSTRUCTIONS  Follow with Primary care provider  [name], DO  and other consultants as instructed by your Hospitalist Physician  [place] IF SYMPTOMS COME BACK, WORSEN OR NEW PROBLEM DEVELOPS   Please note: You were cared for by a hospitalist during your hospital stay. Every effort will be made to forward records to your primary care provider.  You can request that your primary care provider send for your hospital records if they have not received them.  Once you are discharged, your primary care physician will handle any further medical issues. Please note that NO REFILLS for any discharge medications will be authorized once you are discharged, as it is imperative that you return to your primary care physician (or establish a relationship with a primary care physician if you do not have one) for your post hospital discharge needs so that they can reassess your need for medications and monitor your lab values.  Please get a complete blood count and chemistry panel checked by your Primary MD at your next visit, and again as instructed  by your Primary MD.  Get Medicines reviewed and adjusted: Please take all your medications with you for your next visit with your Primary MD  Laboratory/radiological data: Please request your Primary MD to go over all hospital tests and procedure/radiological results at the follow up, please ask your primary care provider to get all Hospital  records sent to his/her office.  In some cases, they will be blood work, cultures and biopsy results pending at the time of your discharge. Please request that your primary care provider follow up on these results.  If you are diabetic, please bring your blood sugar readings with you to your follow up appointment with primary care.    Please call and make your follow up appointments as soon as possible.    Also Note the following: If you experience worsening of your admission symptoms, develop shortness of breath, life threatening emergency, suicidal or homicidal thoughts you must seek medical attention immediately by calling 911 or calling your MD immediately  if symptoms less severe.  You must read complete instructions/literature along with all the possible adverse reactions/side effects for all the Medicines you take and that have been prescribed to you. Take any new Medicines after you have completely understood and accpet all the possible adverse reactions/side effects.   Do not drive when taking Pain medications or sleeping medications (Benzodiazepines)  Do not take more than prescribed Pain, Sleep and Anxiety Medications. It is not advisable to combine anxiety,sleep and pain medications without talking with your primary care practitioner  Special Instructions: If you have smoked or chewed Tobacco  in the last 2 yrs please stop smoking, stop any regular Alcohol  and or any Recreational drug use.  Wear Seat belts while driving.  Do not drive if taking any narcotic, mind altering or controlled substances or recreational drugs or alcohol.

## 2020-03-03 NOTE — Care Management Obs Status (Signed)
[place] NOTIFICATION   Patient Details  Name: Vincent Cooper MRN: [id number] Date of Birth: [birth year]   Medicare Observation Status Notification Given:  Yes    Tommy Medal 03/03/2020, 3:37 PM

## 2020-03-03 NOTE — Progress Notes (Signed)
Nutrition Brief Note  RD consulted for assessment of nutritional status  79 year old male with past medical history of obesity, COPD, DM2, HTN, presented with worsening shortness of breath. CXR without evidence of volume overload or infectious process, noted improvement to symptoms s/p nebulizer treatment in ED. Patient admitted for observation of wheezing and dyspnea with activity  Wt Readings from Last 15 Encounters:  03/02/20 115.6 kg  01/20/20 115.6 kg  01/19/20 118.1 kg  01/06/20 117.9 kg  12/31/19 117.9 kg  11/13/19 117.9 kg  11/06/19 117.9 kg  11/05/19 117.9 kg    Body mass index is 37.64 kg/m. Patient meets criteria for obesity unspecified based on current BMI.   Current diet order is HH/CM, patient is consuming approximately 80-95% of meals at this time. Labs and medications reviewed.   No nutrition interventions warranted at this time. If nutrition issues arise, please re-consult RD.   Lajuan Lines, RD, LDN Clinical Nutrition After Hours/Weekend Pager # in [phone]

## 2020-03-03 NOTE — Evaluation (Signed)
Physical Therapy Evaluation Patient Details Name: Vincent Cooper MRN: [id number] DOB: 1941/03/15 Today's Date: 03/03/2020   History of Present Illness  Vincent Cooper is a 79 y.o. 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 He has some dyspnea at baseline, significantly worsened this morning, especially with activity.    Clinical Impression  Pt admitted with above diagnosis. Patient modified independent for bed mobility. Supervision to min guard for transfers and ambulation for safety in an unfamiliar environment. PT suggested patient begin a walking program when he returns home. Patient open to idea and planning his program during our evaluation. Patient requiring 2 LPM supplemental oxygen for activity with no prior oxygen needs at home prior to admission. Pt currently with functional limitations due to the deficits listed below (see PT Problem List). Pt will benefit from skilled PT to increase their independence and safety with mobility to allow discharge to the venue listed below.       Follow Up Recommendations No PT follow up    Equipment Recommendations  None recommended by PT    Recommendations for Other Services       Precautions / Restrictions Precautions Precautions: None Restrictions Weight Bearing Restrictions: No      Mobility  Bed Mobility Overal bed mobility: Modified Independent             General bed mobility comments: increased time  Transfers Overall transfer level: Needs assistance Equipment used: Straight cane Transfers: Sit to/from Stand;Stand Pivot Transfers Sit to Stand: Supervision;Min guard Stand pivot transfers: Supervision;Min guard       General transfer comment: supervision/min guard for safety  Ambulation/Gait Ambulation/Gait assistance: Supervision;Min guard Gait Distance (Feet): 300  Feet Assistive device: Straight cane Gait Pattern/deviations: Step-through pattern;Decreased step length - right;Decreased step length - left;Decreased stride length Gait velocity: decreased   General Gait Details: somewhat slow but steady paced gait; signs of shortness of breathe towards end of distance; limited by fatigue; supervision/min guard for safety; 2 LPM supplemental oxygen  Stairs            Wheelchair Mobility    Modified Rankin (Stroke Patients Only)       Balance Overall balance assessment: Mild deficits observed, not formally tested                                           Pertinent Vitals/Pain Pain Assessment: No/denies pain    Home Living Family/patient expects to be discharged to:: Private residence Living Arrangements: Alone Available Help at Discharge: Family;Available PRN/intermittently(daughter) Type of Home: House       Home Layout: One level Home Equipment: [place] - 2 wheels;Shower seat;Adaptive equipment      Prior Function Level of Independence: Independent with assistive device(s)         Comments: Pt reports that his daughter will assist him with laundry and heavy household cleaning. He is able to prepare meals (he loves to cook), and he can do light cleaning.     Hand Dominance   Dominant Hand: Right    Extremity/Trunk Assessment   Upper Extremity Assessment Upper Extremity Assessment: Defer to OT evaluation    Lower Extremity Assessment Lower Extremity Assessment: Overall WFL for tasks assessed    Cervical / Trunk Assessment Cervical / Trunk Assessment: Normal  Communication  Communication: Other (comment)(slight hearing difficulties)  Cognition Arousal/Alertness: Awake/alert Behavior During Therapy: WFL for tasks assessed/performed Overall Cognitive Status: Within Functional Limits for tasks assessed                                        General Comments      Exercises      Assessment/Plan    PT Assessment Patient needs continued PT services  PT Problem List Decreased balance;Decreased activity tolerance;Decreased mobility;Obesity       PT Treatment Interventions Therapeutic activities;Gait training;Therapeutic exercise;Patient/family education;Balance training    PT Goals (Current goals can be found in the Care Plan section)  Acute Rehab PT Goals Patient Stated Goal: Go home and start walking program. PT Goal Formulation: With patient Time For Goal Achievement: 03/17/20 Potential to Achieve Goals: Good    Frequency Min 3X/week   Barriers to discharge        Co-evaluation               AM-PAC PT "6 Clicks" Mobility  Outcome Measure Help needed turning from your back to your side while in a flat bed without using bedrails?: None Help needed moving from lying on your back to sitting on the side of a flat bed without using bedrails?: None Help needed moving to and from a bed to a chair (including a wheelchair)?: A Little Help needed standing up from a chair using your arms (e.g., wheelchair or bedside chair)?: A Little Help needed to walk in hospital room?: A Little Help needed climbing 3-5 steps with a railing? : A Little 6 Click Score: 20    End of Session Equipment Utilized During Treatment: Gait belt;Oxygen Activity Tolerance: Patient tolerated treatment well;Patient limited by fatigue Patient left: in chair;with call bell/phone within reach Nurse Communication: Mobility status PT Visit Diagnosis: Unsteadiness on feet (R26.81);Other abnormalities of gait and mobility (R26.89)    Time: 1355-1425 PT Time Calculation (min) (ACUTE ONLY): 30 min   Charges:   PT Evaluation $PT Eval Low Complexity: 1 Low PT Treatments $Gait Training: 8-22 mins        Floria Raveling. Hartnett-Rands, MS, PT Per [place] [id number] 03/03/2020, 2:31 PM

## 2020-03-03 NOTE — Discharge Summary (Signed)
Physician Discharge Summary  Vincent Cooper [id number] DOB: Dec 23, 1940 DOA: 03/02/2020  PCP: [name], DO  Admit date: 03/02/2020 Discharge date: 03/03/2020  Admitted From:  Home   Disposition:  Home   Recommendations for Outpatient Follow-up:  1. Follow up with PCP in 1 weeks  Discharge Condition: STABLE   CODE STATUS: FULL    Brief Hospitalization Summary: Please see all hospital notes, images, labs for full details of the hospitalization.  ADMISSION HPI:  Vincent Cooper  is a 79 y.o. 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  He has some dyspnea at baseline, significantly worsened this morning, especially with activity, as well reports cough, with a productive white phlegm, he denies fever, chills, no chest pain, dysuria or polyuria, he was noted to be significantly dyspneic and neck by EMS he was given 125 mg of IV Solu-Medrol, and nebulizer treatment with improvement of his symptoms, he remains with significant wheezing and dyspnea with activity in ED, there is no hypoxia though, chest x-ray with cardiomegaly, but no volume overload or infectious process, COVID 19 WAS NEGATIVE  hospitalist was consulted to admit.  MILD COPD exacerbation -Patient reports that he is feeling much better after responding to the IV steroids.  He is feeling much better and he has been ambulating.  He worked with physical therapy and no further PT needs were identified.  His oxygen was weaned off and he remained 92% with ambulation and no supplemental oxygen.  He would like to go home.  We will discharge him home for outpatient follow-up with his primary care provider.  Sent home on 4 more days of oral steroid.  Diabetes mellitus type 2 -Resume home medications and treatments.  History of pulmonary embolism -continue Eliquis  Hypertension -blood pressure is  stable, continue Cardizem, Zestril 20 mg daily  Chronic diastolic CHF -Appears to be euvolemic, continue with home dose Lasix.  Gout -continue allopurinol   Morbid obesity  DVT Prophylaxis on Eliquis  AM Labs Ordered, also please review Full Orders  Code Status Full  Likely DC to  Home  Condition GUARDED    Consults called: None  Admission status: Observation  Discharge Diagnoses:  Active Problems:   Diabetes mellitus (HCC)   OSA and COPD overlap syndrome (HCC)   Essential hypertension   Morbid obesity ([place])   COPD with acute exacerbation (HCC)   Chronic diastolic HF (heart failure) ([place])  Discharge Instructions:  Allergies as of 03/03/2020      Reactions   Other    Patient reports he was allergic to something in an IV he was given but does not know what the substance was. As of 01/19/2020       Medication List    TAKE these medications   albuterol 108 (90 Base) MCG/ACT inhaler Commonly known as: VENTOLIN HFA Inhale 1 puff into the lungs every 6 (six) hours as needed for wheezing or shortness of breath.   allopurinol 300 MG tablet Commonly known as: ZYLOPRIM Take 300 mg by mouth daily.   apixaban 5 MG Tabs tablet Commonly known as: ELIQUIS Take 1 tablet (5 mg total) by mouth 2 (two) times daily.   budesonide-formoterol 160-4.5 MCG/ACT inhaler Commonly known as: SYMBICORT Inhale 2 puffs into the lungs in the morning and at bedtime.   diltiazem 180 MG 24 hr capsule Commonly known as: Cardizem CD Take 1 capsule (180 mg total) by  mouth daily.   doxycycline 100 MG capsule Commonly known as: VIBRAMYCIN Take 1 capsule (100 mg total) by mouth 2 (two) times daily for 3 days.   furosemide 40 MG tablet Commonly known as: LASIX Take 1 tablet (40 mg total) by mouth daily. OR AS DIRECTED   guaiFENesin 600 MG 12 hr tablet Commonly known as: MUCINEX Take 1 tablet (600 mg total) by mouth 2 (two) times daily.   ipratropium-albuterol 0.5-2.5 (3)  MG/3ML Soln Commonly known as: DUONEB Take 3 mLs by nebulization 3 (three) times daily. What changed:   when to take this  reasons to take this   lisinopril 40 MG tablet Commonly known as: ZESTRIL Take 0.5 tablets (20 mg total) by mouth daily.   metFORMIN 500 MG tablet Commonly known as: GLUCOPHAGE Take 500 mg by mouth 2 (two) times daily with a meal.   nitroGLYCERIN 0.4 MG SL tablet Commonly known as: NITROSTAT Place 1 tablet (0.4 mg total) under the tongue every 5 (five) minutes as needed for chest pain.   predniSONE 20 MG tablet Commonly known as: DELTASONE Take 2 tablets (40 mg total) by mouth daily with breakfast for 4 days. Start taking on: March 04, 2020      Follow-up Information    [name], [place]. Schedule an appointment as soon as possible for a visit in 1 week(s).   Specialty: Family Medicine Contact information: [address] [place] [address] [phone]          Allergies  Allergen Reactions  . Other     Patient reports he was allergic to something in an IV he was given but does not know what the substance was. As of 01/19/2020    Allergies as of 03/03/2020      Reactions   Other    Patient reports he was allergic to something in an IV he was given but does not know what the substance was. As of 01/19/2020       Medication List    TAKE these medications   albuterol 108 (90 Base) MCG/ACT inhaler Commonly known as: VENTOLIN HFA Inhale 1 puff into the lungs every 6 (six) hours as needed for wheezing or shortness of breath.   allopurinol 300 MG tablet Commonly known as: ZYLOPRIM Take 300 mg by mouth daily.   apixaban 5 MG Tabs tablet Commonly known as: ELIQUIS Take 1 tablet (5 mg total) by mouth 2 (two) times daily.   budesonide-formoterol 160-4.5 MCG/ACT inhaler Commonly known as: SYMBICORT Inhale 2 puffs into the lungs in the morning and at bedtime.   diltiazem 180 MG 24 hr capsule Commonly known as: Cardizem CD Take 1 capsule (180  mg total) by mouth daily.   doxycycline 100 MG capsule Commonly known as: VIBRAMYCIN Take 1 capsule (100 mg total) by mouth 2 (two) times daily for 3 days.   furosemide 40 MG tablet Commonly known as: LASIX Take 1 tablet (40 mg total) by mouth daily. OR AS DIRECTED   guaiFENesin 600 MG 12 hr tablet Commonly known as: MUCINEX Take 1 tablet (600 mg total) by mouth 2 (two) times daily.   ipratropium-albuterol 0.5-2.5 (3) MG/3ML Soln Commonly known as: DUONEB Take 3 mLs by nebulization 3 (three) times daily. What changed:   when to take this  reasons to take this   lisinopril 40 MG tablet Commonly known as: ZESTRIL Take 0.5 tablets (20 mg total) by mouth daily.   metFORMIN 500 MG tablet Commonly known as: GLUCOPHAGE Take 500 mg by  mouth 2 (two) times daily with a meal.   nitroGLYCERIN 0.4 MG SL tablet Commonly known as: NITROSTAT Place 1 tablet (0.4 mg total) under the tongue every 5 (five) minutes as needed for chest pain.   predniSONE 20 MG tablet Commonly known as: DELTASONE Take 2 tablets (40 mg total) by mouth daily with breakfast for 4 days. Start taking on: March 04, 2020       Procedures/Studies: DG Chest Portable 1 View  Result Date: 03/02/2020 CLINICAL DATA:  Acute onset of shortness of breath and BILATERAL lower extremity edema that began last night. Wheezing on clinical examination. EXAM: PORTABLE CHEST 1 VIEW COMPARISON:  01/19/2020 and earlier, including CTA chest 11/06/2019. FINDINGS: Cardiac silhouette upper normal in size to slightly enlarged for AP portable technique, unchanged. Thoracic aorta atherosclerotic, unchanged. Prominent mediastinal fat in the inferior mediastinum as noted on the prior CT accounts for the apparent mass in the lower mediastinum. Mild pulmonary venous hypertension without overt edema. Lungs clear. No visible pleural effusions. IMPRESSION: Stable borderline to mild cardiomegaly. No acute cardiopulmonary disease. Electronically Signed    By: Evangeline Dakin M.D.   On: 03/02/2020 13:01     Subjective: Pt says that he is breathing much better.   He wants to go home.   Discharge Exam: Vitals:   03/03/20 1356 03/03/20 1525  BP: (!) 118/96   Pulse: 83   Resp: 20   Temp: 97.9 F (36.6 C)   SpO2: 99% 99%   Vitals:   03/03/20 0802 03/03/20 1131 03/03/20 1356 03/03/20 1525  BP: (!) 98/52  (!) 118/96   Pulse:   83   Resp:   20   Temp:   97.9 F (36.6 C)   TempSrc:   Oral   SpO2:  97% 99% 99%  Weight:      Height:       General: Pt is alert, awake, not in acute distress Cardiovascular: RRR, S1/S2 +, no rubs, no gallops Respiratory: good air movement, no wheezing, no rhonchi Abdominal: Soft, NT, ND, bowel sounds + Extremities: no edema, no cyanosis   The results of significant diagnostics from this hospitalization (including imaging, microbiology, ancillary and laboratory) are listed below for reference.    Microbiology: Recent Results (from the past 240 hour(s))  Respiratory Panel by RT PCR (Flu A&B, Covid) - Nasopharyngeal Swab     Status: None   Collection Time: 03/02/20 12:35 PM   Specimen: Nasopharyngeal Swab  Result Value Ref Range Status   SARS Coronavirus 2 by RT PCR NEGATIVE NEGATIVE Final    Comment: (NOTE) SARS-CoV-2 target nucleic acids are NOT DETECTED. The SARS-CoV-2 RNA is generally detectable in upper respiratoy specimens during the acute phase of infection. The lowest concentration of SARS-CoV-2 viral copies this assay can detect is 131 copies/mL. A negative result does not preclude SARS-Cov-2 infection and should not be used as the sole basis for treatment or other patient management decisions. A negative result may occur with  improper specimen collection/handling, submission of specimen other than nasopharyngeal swab, presence of viral mutation(s) within the areas targeted by this assay, and inadequate number of viral copies (<131 copies/mL). A negative result must be combined with  clinical observations, patient history, and epidemiological information. The expected result is Negative. Fact Sheet for Patients:  PinkCheek.be Fact Sheet for Healthcare Providers:  GravelBags.it This test is not yet ap proved or cleared by the [place] FDA and  has been authorized for detection and/or diagnosis of SARS-CoV-2 by FDA under  an Emergency Use Authorization (EUA). This EUA will remain  in effect (meaning this test can be used) for the duration of the COVID-19 declaration under Section 564(b)(1) of the Act, 21 U.S.C. section 360bbb-3(b)(1), unless the authorization is terminated or revoked sooner.    Influenza A by PCR NEGATIVE NEGATIVE Final   Influenza B by PCR NEGATIVE NEGATIVE Final    Comment: (NOTE) The Xpert Xpress SARS-CoV-2/FLU/RSV assay is intended as an aid in  the diagnosis of influenza from Nasopharyngeal swab specimens and  should not be used as a sole basis for treatment. Nasal washings and  aspirates are unacceptable for Xpert Xpress SARS-CoV-2/FLU/RSV  testing. Fact Sheet for Patients: PinkCheek.be Fact Sheet for Healthcare Providers: GravelBags.it This test is not yet approved or cleared by the [place] FDA and  has been authorized for detection and/or diagnosis of SARS-CoV-2 by  FDA under an Emergency Use Authorization (EUA). This EUA will remain  in effect (meaning this test can be used) for the duration of the  Covid-19 declaration under Section 564(b)(1) of the Act, 21  U.S.C. section 360bbb-3(b)(1), unless the authorization is  terminated or revoked. Performed at [facility], [address], [place], [place] [address]      Labs: BNP (last 3 results) Recent Labs    03/02/20 1325  BNP [id number]   Basic Metabolic Panel: Recent Labs  Lab 02/29/20 1539 03/02/20 1325 03/03/20 0512  NA 144 139 138  K 4.4 4.2 4.4  CL 103  100 100  CO2 24 27 26   GLUCOSE 110* 140* 201*  BUN 18 26* 26*  CREATININE 0.90 0.97 0.88  CALCIUM 9.3 9.3 9.4   Liver Function Tests: No results for input(s): AST, ALT, ALKPHOS, BILITOT, PROT, ALBUMIN in the last 168 hours. No results for input(s): LIPASE, AMYLASE in the last 168 hours. No results for input(s): AMMONIA in the last 168 hours. CBC: Recent Labs  Lab 03/02/20 1325 03/03/20 0512  WBC 12.4* 11.0*  HGB 13.6 13.3  HCT 42.0 40.8  MCV 103.2* 102.0*  PLT 254 250   Cardiac Enzymes: No results for input(s): CKTOTAL, CKMB, CKMBINDEX, TROPONINI in the last 168 hours. BNP: Invalid input(s): POCBNP CBG: Recent Labs  Lab 03/02/20 1659 03/02/20 2334 03/03/20 0734 03/03/20 1140 03/03/20 1638  GLUCAP 245* 236* 195* 238* 172*   D-Dimer No results for input(s): DDIMER in the last 72 hours. Hgb A1c No results for input(s): HGBA1C in the last 72 hours. Lipid Profile No results for input(s): CHOL, HDL, LDLCALC, TRIG, CHOLHDL, LDLDIRECT in the last 72 hours. Thyroid function studies No results for input(s): TSH, T4TOTAL, T3FREE, THYROIDAB in the last 72 hours.  Invalid input(s): FREET3 Anemia work up No results for input(s): VITAMINB12, FOLATE, FERRITIN, TIBC, IRON, RETICCTPCT in the last 72 hours. Urinalysis No results found for: COLORURINE, APPEARANCEUR, [place], [place], [place], [place], [place], [place], PROTEINUR, UROBILINOGEN, NITRITE, LEUKOCYTESUR Sepsis Labs Invalid input(s): PROCALCITONIN,  WBC,  LACTICIDVEN Microbiology Recent Results (from the past 240 hour(s))  Respiratory Panel by RT PCR (Flu A&B, Covid) - Nasopharyngeal Swab     Status: None   Collection Time: 03/02/20 12:35 PM   Specimen: Nasopharyngeal Swab  Result Value Ref Range Status   SARS Coronavirus 2 by RT PCR NEGATIVE NEGATIVE Final    Comment: (NOTE) SARS-CoV-2 target nucleic acids are NOT DETECTED. The SARS-CoV-2 RNA is generally detectable in upper respiratoy specimens during the  acute phase of infection. The lowest concentration of SARS-CoV-2 viral copies this assay can detect is 131 copies/mL.  A negative result does not preclude SARS-Cov-2 infection and should not be used as the sole basis for treatment or other patient management decisions. A negative result may occur with  improper specimen collection/handling, submission of specimen other than nasopharyngeal swab, presence of viral mutation(s) within the areas targeted by this assay, and inadequate number of viral copies (<131 copies/mL). A negative result must be combined with clinical observations, patient history, and epidemiological information. The expected result is Negative. Fact Sheet for Patients:  PinkCheek.be Fact Sheet for Healthcare Providers:  GravelBags.it This test is not yet ap proved or cleared by the [place] FDA and  has been authorized for detection and/or diagnosis of SARS-CoV-2 by FDA under an Emergency Use Authorization (EUA). This EUA will remain  in effect (meaning this test can be used) for the duration of the COVID-19 declaration under Section 564(b)(1) of the Act, 21 U.S.C. section 360bbb-3(b)(1), unless the authorization is terminated or revoked sooner.    Influenza A by PCR NEGATIVE NEGATIVE Final   Influenza B by PCR NEGATIVE NEGATIVE Final    Comment: (NOTE) The Xpert Xpress SARS-CoV-2/FLU/RSV assay is intended as an aid in  the diagnosis of influenza from Nasopharyngeal swab specimens and  should not be used as a sole basis for treatment. Nasal washings and  aspirates are unacceptable for Xpert Xpress SARS-CoV-2/FLU/RSV  testing. Fact Sheet for Patients: PinkCheek.be Fact Sheet for Healthcare Providers: GravelBags.it This test is not yet approved or cleared by the [place] FDA and  has been authorized for detection and/or diagnosis of SARS-CoV-2  by  FDA under an Emergency Use Authorization (EUA). This EUA will remain  in effect (meaning this test can be used) for the duration of the  Covid-19 declaration under Section 564(b)(1) of the Act, 21  U.S.C. section 360bbb-3(b)(1), unless the authorization is  terminated or revoked. Performed at [facility], [address] [place], [place] [address]    Time coordinating discharge:   SIGNED:  Irwin Brakeman, MD  [facility] 03/03/2020, 5:00 PM How to contact the [facility] Attending or Consulting provider [place] or covering provider during after hours [place], for this patient?  1. Check the care team in [facility] and look for a) attending/consulting [facility] provider listed and b) the [facility] team listed 2. Log into [email or web address] and use [place]'s universal password to access. If you do not have the password, please contact the hospital operator. 3. Locate the [facility] provider you are looking for under [facility] and page to a number that you can be directly reached. 4. If you still have difficulty reaching the provider, please page the [facility] (Director on Call) for the Hospitalists listed on amion for assistance.

## 2020-03-06 ENCOUNTER — Telehealth (HOSPITAL_COMMUNITY): Payer: Self-pay | Admitting: Emergency Medicine

## 2020-03-06 NOTE — Telephone Encounter (Signed)
Reaching out to patient to offer assistance regarding upcoming cardiac imaging study; pt verbalizes understanding of appt date/time, parking situation and where to check in, pre-test NPO status and medications ordered, and verified current allergies; name and call back number provided for further questions should they arise Ruby Logiudice RN Navigator Cardiac Imaging [facility] [phone] office [phone] cell 

## 2020-03-06 NOTE — Telephone Encounter (Signed)
Error

## 2020-03-07 ENCOUNTER — Ambulatory Visit (HOSPITAL_COMMUNITY)
Admission: RE | Admit: 2020-03-07 | Discharge: 2020-03-07 | Disposition: A | Discharge status: Home / Self Care | Payer: Medicare PPO | Source: Ambulatory Visit | Attending: Cardiology | Admitting: Cardiology

## 2020-03-07 ENCOUNTER — Other Ambulatory Visit: Payer: Self-pay

## 2020-03-07 DIAGNOSIS — E119 Type 2 diabetes mellitus without complications: Secondary | ICD-10-CM

## 2020-03-07 DIAGNOSIS — R072 Precordial pain: Secondary | ICD-10-CM

## 2020-03-07 DIAGNOSIS — I2693 Single subsegmental pulmonary embolism without acute cor pulmonale: Secondary | ICD-10-CM | POA: Diagnosis present

## 2020-03-07 DIAGNOSIS — R079 Chest pain, unspecified: Secondary | ICD-10-CM | POA: Insufficient documentation

## 2020-03-07 DIAGNOSIS — R0602 Shortness of breath: Secondary | ICD-10-CM | POA: Diagnosis present

## 2020-03-07 MED ORDER — IOHEXOL 350 MG/ML SOLN
80.0000 mL | Freq: Once | INTRAVENOUS | Status: AC | PRN
  Administered 2020-03-07: 80 mL via INTRAVENOUS

## 2020-03-07 MED ORDER — NITROGLYCERIN 0.4 MG SL SUBL
SUBLINGUAL_TABLET | SUBLINGUAL | Status: AC
  Filled 2020-03-07: qty 2

## 2020-03-07 MED ORDER — NITROGLYCERIN 0.4 MG SL SUBL
0.8000 mg | SUBLINGUAL_TABLET | Freq: Once | SUBLINGUAL | Status: AC
  Administered 2020-03-07: 0.8 mg via SUBLINGUAL

## 2020-03-08 ENCOUNTER — Ambulatory Visit (HOSPITAL_COMMUNITY)
Admission: RE | Admit: 2020-03-08 | Discharge: 2020-03-08 | Disposition: A | Discharge status: Home / Self Care | Payer: Medicare PPO | Source: Ambulatory Visit | Attending: Cardiology | Admitting: Cardiology

## 2020-03-08 DIAGNOSIS — I2584 Coronary atherosclerosis due to calcified coronary lesion: Secondary | ICD-10-CM

## 2020-03-08 DIAGNOSIS — R072 Precordial pain: Secondary | ICD-10-CM

## 2020-03-08 DIAGNOSIS — R0602 Shortness of breath: Secondary | ICD-10-CM | POA: Diagnosis present

## 2020-03-08 DIAGNOSIS — R079 Chest pain, unspecified: Secondary | ICD-10-CM

## 2020-03-08 DIAGNOSIS — I2510 Atherosclerotic heart disease of native coronary artery without angina pectoris: Secondary | ICD-10-CM

## 2020-03-08 DIAGNOSIS — I2693 Single subsegmental pulmonary embolism without acute cor pulmonale: Secondary | ICD-10-CM

## 2020-03-08 DIAGNOSIS — E119 Type 2 diabetes mellitus without complications: Secondary | ICD-10-CM | POA: Diagnosis present

## 2020-03-08 HISTORY — DX: Coronary atherosclerosis due to calcified coronary lesion: I25.84

## 2020-03-08 HISTORY — DX: Atherosclerotic heart disease of native coronary artery without angina pectoris: I25.10

## 2020-03-13 ENCOUNTER — Telehealth: Payer: Self-pay | Admitting: Cardiology

## 2020-03-13 NOTE — Telephone Encounter (Signed)
Pt c/o BP issue: STAT if pt c/o blurred vision, one-sided weakness or slurred speech  1. What are your last 5 BP readings?   115/50  2. Are you having any other symptoms (ex. Dizziness, headache, blurred vision, passed out)? No  3. What is your BP issue? Patient's daughter states BP has been low.

## 2020-03-13 NOTE — Telephone Encounter (Signed)
I want to keep the increased dose of diltiazem.  Lets just cut his ACE inhibitor dose in half.   Glenetta Hew, MD

## 2020-03-13 NOTE — Telephone Encounter (Signed)
Spoke with daughter and patients blood pressure has been running 110-115/55-60  Yesterday when his diastolic blood pressure was down to 50 he was not feeling well Confirmed taking medications as directed Will forward to Dr [name] for review

## 2020-03-13 NOTE — Telephone Encounter (Signed)
Called  Daughter -[name].   Unable to leave message  - voicemail is full.

## 2020-03-15 MED ORDER — LISINOPRIL 40 MG PO TABS: 20.0000 mg | ORAL_TABLET | Freq: Every day | ORAL | 6 refills | Status: DC

## 2020-03-15 MED ORDER — DILTIAZEM HCL ER COATED BEADS 180 MG PO CP24: 360.0000 mg | ORAL_CAPSULE | Freq: Every day | ORAL | 2 refills | Status: DC

## 2020-03-15 NOTE — Telephone Encounter (Signed)
Spoke with patient's daughter . Aware to keep taking 2 tablets of 180 mg  ( total 360 mg )   decrease lisinopril 40 mg  to (1/2 tablet of 20 mg )  daily   keep appt for 03/20/20   daughter states today blood pressure was in 110'/ and heart rate 101.

## 2020-03-17 ENCOUNTER — Other Ambulatory Visit: Payer: Self-pay

## 2020-03-17 ENCOUNTER — Emergency Department (HOSPITAL_COMMUNITY)
Admission: EM | Admit: 2020-03-17 | Discharge: 2020-03-17 | Disposition: A | Discharge status: Home / Self Care | Payer: Medicare PPO | Attending: Emergency Medicine | Admitting: Emergency Medicine

## 2020-03-17 ENCOUNTER — Emergency Department (HOSPITAL_COMMUNITY): Payer: Medicare PPO

## 2020-03-17 DIAGNOSIS — R0602 Shortness of breath: Secondary | ICD-10-CM | POA: Diagnosis present

## 2020-03-17 DIAGNOSIS — Z79899 Other long term (current) drug therapy: Secondary | ICD-10-CM | POA: Diagnosis not present

## 2020-03-17 DIAGNOSIS — Z7984 Long term (current) use of oral hypoglycemic drugs: Secondary | ICD-10-CM | POA: Insufficient documentation

## 2020-03-17 DIAGNOSIS — J441 Chronic obstructive pulmonary disease with (acute) exacerbation: Secondary | ICD-10-CM | POA: Insufficient documentation

## 2020-03-17 DIAGNOSIS — E119 Type 2 diabetes mellitus without complications: Secondary | ICD-10-CM | POA: Diagnosis not present

## 2020-03-17 DIAGNOSIS — I110 Hypertensive heart disease with heart failure: Secondary | ICD-10-CM | POA: Insufficient documentation

## 2020-03-17 DIAGNOSIS — Z7901 Long term (current) use of anticoagulants: Secondary | ICD-10-CM | POA: Insufficient documentation

## 2020-03-17 DIAGNOSIS — I5032 Chronic diastolic (congestive) heart failure: Secondary | ICD-10-CM | POA: Insufficient documentation

## 2020-03-17 LAB — CBC WITH DIFFERENTIAL/PLATELET
Abs Immature Granulocytes: 0.03 10*3/uL (ref 0.00–0.07)
Basophils Absolute: 0 10*3/uL (ref 0.0–0.1)
Basophils Relative: 0 %
Eosinophils Absolute: 0.7 10*3/uL — ABNORMAL HIGH (ref 0.0–0.5)
Eosinophils Relative: 7 %
HCT: 38.8 % — ABNORMAL LOW (ref 39.0–52.0)
Hemoglobin: 12.8 g/dL — ABNORMAL LOW (ref 13.0–17.0)
Immature Granulocytes: 0 %
Lymphocytes Relative: 11 %
Lymphs Abs: 1.1 10*3/uL (ref 0.7–4.0)
MCH: 33.4 pg (ref 26.0–34.0)
MCHC: 33 g/dL (ref 30.0–36.0)
MCV: 101.3 fL — ABNORMAL HIGH (ref 80.0–100.0)
Monocytes Absolute: 0.6 10*3/uL (ref 0.1–1.0)
Monocytes Relative: 7 %
Neutro Abs: 7.1 10*3/uL (ref 1.7–7.7)
Neutrophils Relative %: 75 %
Platelets: 222 10*3/uL (ref 150–400)
RBC: 3.83 MIL/uL — ABNORMAL LOW (ref 4.22–5.81)
RDW: 14.9 % (ref 11.5–15.5)
WBC: 9.6 10*3/uL (ref 4.0–10.5)
nRBC: 0 % (ref 0.0–0.2)

## 2020-03-17 LAB — BASIC METABOLIC PANEL
Anion gap: 9 (ref 5–15)
BUN: 17 mg/dL (ref 8–23)
CO2: 25 mmol/L (ref 22–32)
Calcium: 8.9 mg/dL (ref 8.9–10.3)
Chloride: 104 mmol/L (ref 98–111)
Creatinine, Ser: 0.76 mg/dL (ref 0.61–1.24)
GFR calc Af Amer: >60 mL/min (ref >60)
GFR calc non Af Amer: >60 mL/min (ref >60)
Glucose, Bld: 125 mg/dL — ABNORMAL HIGH (ref 70–99)
Potassium: 4.1 mmol/L (ref 3.5–5.1)
Sodium: 138 mmol/L (ref 135–145)

## 2020-03-17 LAB — TROPONIN I (HIGH SENSITIVITY)
Troponin I (High Sensitivity): 5 ng/L (ref <18)
Troponin I (High Sensitivity): 5 ng/L (ref <18)

## 2020-03-17 LAB — BRAIN NATRIURETIC PEPTIDE: B Natriuretic Peptide: 36 pg/mL (ref 0.0–100.0)

## 2020-03-17 MED ORDER — METHYLPREDNISOLONE SODIUM SUCC 125 MG IJ SOLR
125.0000 mg | Freq: Once | INTRAMUSCULAR | Status: AC
  Administered 2020-03-17: 10:00:00 125 mg via INTRAVENOUS
  Filled 2020-03-17: qty 2

## 2020-03-17 MED ORDER — PREDNISONE 20 MG PO TABS
60.0000 mg | ORAL_TABLET | Freq: Every day | ORAL | 0 refills | Status: DC
Start: 2020-03-17 — End: 2020-06-20

## 2020-03-17 MED ORDER — ALBUTEROL SULFATE HFA 108 (90 BASE) MCG/ACT IN AERS
4.0000 | INHALATION_SPRAY | Freq: Once | RESPIRATORY_TRACT | Status: AC
  Administered 2020-03-17: 09:00:00 4 via RESPIRATORY_TRACT
  Filled 2020-03-17: qty 6.7

## 2020-03-17 NOTE — Discharge Instructions (Signed)
You are seen in the emergency department for worsening shortness of breath.  Your lab work did not show any evidence of congestive heart failure or heart attack.  This is likely related to your COPD.  Your symptoms improved with some inhaler treatments and steroids.  Please continue 4 more days of steroids.  Contact your doctor for follow-up.  Return to the emergency department if any worsening or concerning symptoms

## 2020-03-17 NOTE — ED Triage Notes (Signed)
Pt reports was recently admitted and discharged on April 16th for same. Pt reports recent shortness of breath episode started last night. Pt alert and oriented. Dyspnea with exertion and at rest. Pt denies being on any home o2. Denies pain.

## 2020-03-17 NOTE — ED Provider Notes (Signed)
[facility] EMERGENCY DEPARTMENT Provider Note   CSN: [id number] Arrival date & time: 03/17/20  [id number]     History Chief Complaint  Patient presents with  . Shortness of Breath    Vincent Cooper is a 79 y.o. male.  He has a history of COPD diabetes PE on anticoagulation.  He was admitted about 2 weeks ago for COPD exacerbation.  He finished 4 days of prednisone.  He said about 2 or 3 days ago he began experiencing more shortness of breath with exertion and also with lying down flat.  He said his oxygen numbers drop in the low 80s when he ambulates.  He said he does not use oxygen at home.  No chest pain fever.  Cough productive of some white sputum.  No worsening lower extremity edema.  The history is provided by the patient.  Shortness of Breath Severity:  Moderate Onset quality:  Gradual Timing:  Intermittent Progression:  Worsening Chronicity:  Recurrent Context: activity   Relieved by:  Sitting up and rest Worsened by:  Activity and exertion Ineffective treatments:  Inhaler Associated symptoms: cough and sputum production   Associated symptoms: no abdominal pain, no chest pain, no fever, no headaches, no hemoptysis, no neck pain, no rash, no sore throat, no syncope and no vomiting   Risk factors: hx of PE/DVT and obesity        Past Medical History:  Diagnosis Date  . Chronic gout   . COPD (chronic obstructive pulmonary disease) ([place])    Oxygen dependent  . Diabetes mellitus type II, non insulin dependent ([place])   . Hypertension   . Morbid obesity (HCC)    BMI of 38.5 with multiple risk factors.  . OSA on CPAP   . Pulmonary emboli ([place]) 10/2019   Chest CTA-4 [place] opacification of main PA but there is partially occlusive main posterior RLL and segmental/segmental branches.  Small thrombus noted in the anterior right middle lobe.  No RV strain.  Scattered aortic atherosclerosis involving great vessels.  Coronary calcification noted.  Marland Kitchen RLS (restless legs syndrome)      Patient Active Problem List   Diagnosis Date Noted  . Acute respiratory failure with hypoxia ([place])   . Bronchiectasis with acute exacerbation ([place])   . Chronic diastolic HF (heart failure) ([place])   . Chest pain with moderate risk for cardiac etiology 01/19/2020  . Shortness of breath 01/19/2020  . Morbid obesity ([place]) 01/19/2020  . COPD with acute exacerbation ([place]) 01/19/2020  . Cardiac murmur 11/07/2019  . COPD (chronic obstructive pulmonary disease) ([place]) 11/07/2019  . Diabetes mellitus ([place]) 11/07/2019  . OSA and COPD overlap syndrome ([place]) 11/07/2019  . Essential hypertension 11/07/2019  . Single subsegmental pulmonary embolism without acute cor pulmonale ([place]) 11/06/2019    Past Surgical History:  Procedure Laterality Date  . Cataract surgery Right        Family History  Problem Relation Age of Onset  . CAD Neg Hx   . Diabetes Neg Hx     Social History   Tobacco Use  . Smoking status: Former Research scientist (life sciences)  . Smokeless tobacco: Former Network engineer Use Topics  . Alcohol use: Not Currently  . Drug use: Never    Home Medications Prior to Admission medications   Medication Sig Start Date End Date Taking? Authorizing Provider  albuterol (VENTOLIN HFA) 108 (90 Base) MCG/ACT inhaler Inhale 1 puff into the lungs every 6 (six) hours as needed for wheezing or shortness of breath.  [provider]  allopurinol (ZYLOPRIM) 300 MG tablet Take 300 mg by mouth daily.    [provider]  apixaban (ELIQUIS) 5 MG TABS tablet Take 1 tablet (5 mg total) by mouth 2 (two) times daily. 01/21/20   Barton Dubois, MD  budesonide-formoterol [facility]) 160-4.5 MCG/ACT inhaler Inhale 2 puffs into the lungs in the morning and at bedtime. 01/21/20   Barton Dubois, MD  diltiazem (CARDIZEM CD) 180 MG 24 hr capsule Take 2 capsules (360 mg total) by mouth daily. 03/15/20   Leonie Man, MD  furosemide (LASIX) 40 MG tablet Take 1 tablet (40 mg total) by mouth daily. OR AS  DIRECTED 01/19/20 04/18/20  Leonie Man, MD  guaiFENesin (MUCINEX) 600 MG 12 hr tablet Take 1 tablet (600 mg total) by mouth 2 (two) times daily. 01/21/20   Barton Dubois, MD  ipratropium-albuterol (DUONEB) 0.5-2.5 (3) MG/3ML SOLN Take 3 mLs by nebulization 3 (three) times daily. 03/03/20   Johnson, Clanford L, MD  lisinopril (ZESTRIL) 40 MG tablet Take 0.5 tablets (20 mg total) by mouth daily. 03/15/20   Leonie Man, MD  metFORMIN (GLUCOPHAGE) 500 MG tablet Take 500 mg by mouth 2 (two) times daily with a meal.    [provider]  nitroGLYCERIN (NITROSTAT) 0.4 MG SL tablet Place 1 tablet (0.4 mg total) under the tongue every 5 (five) minutes as needed for chest pain. 01/19/20 04/18/20  Leonie Man, MD    Allergies    Other  Review of Systems   Review of Systems  Constitutional: Negative for fever.  HENT: Negative for sore throat.   Eyes: Negative for visual disturbance.  Respiratory: Positive for cough, sputum production and shortness of breath. Negative for hemoptysis.   Cardiovascular: Negative for chest pain and syncope.  Gastrointestinal: Negative for abdominal pain and vomiting.  Genitourinary: Negative for dysuria.  Musculoskeletal: Negative for neck pain.  Skin: Negative for rash.  Neurological: Negative for headaches.    Physical Exam Updated Vital Signs BP (!) 168/62 (BP Location: Left Arm)   Pulse 96   Temp 98.7 F (37.1 C) (Oral)   Resp (!) 21   Ht 5\' 9"  (1.753 m)   Wt 115 kg   SpO2 100%   BMI 37.44 kg/m   Physical Exam Vitals and nursing note reviewed.  Constitutional:      Appearance: He is well-developed.  HENT:     Head: Normocephalic and atraumatic.  Eyes:     Conjunctiva/sclera: Conjunctivae normal.  Cardiovascular:     Rate and Rhythm: Normal rate and regular rhythm.     Heart sounds: No murmur.  Pulmonary:     Effort: Pulmonary effort is normal. Tachypnea present. No respiratory distress.     Breath sounds: Decreased breath sounds  (diffuse) present.  Abdominal:     Palpations: Abdomen is soft.     Tenderness: There is no abdominal tenderness.  Musculoskeletal:     Cervical back: Neck supple.     Right lower leg: No tenderness. Edema present.     Left lower leg: No tenderness. Edema present.  Skin:    General: Skin is warm and dry.     Capillary Refill: Capillary refill takes less than 2 seconds.  Neurological:     General: No focal deficit present.     Mental Status: He is alert.     ED Results / Procedures / Treatments   Labs (all labs ordered are listed, but only abnormal results are displayed) Labs Reviewed  BASIC METABOLIC PANEL - Abnormal; Notable for the following components:      Result Value   Glucose, Bld 125 (*)    All other components within normal limits  CBC WITH DIFFERENTIAL/PLATELET - Abnormal; Notable for the following components:   RBC 3.83 (*)    Hemoglobin 12.8 (*)    HCT 38.8 (*)    MCV 101.3 (*)    Eosinophils Absolute 0.7 (*)    All other components within normal limits  BRAIN NATRIURETIC PEPTIDE  TROPONIN I (HIGH SENSITIVITY)  TROPONIN I (HIGH SENSITIVITY)    EKG EKG Interpretation  Date/Time:  Friday March 17 2020 08:53:47 EDT Ventricular Rate:  95 PR Interval:    QRS Duration: 87 QT Interval:  349 QTC Calculation: 439 R Axis:   65 Text Interpretation: Sinus rhythm No significant change since prior 4/21 Confirmed by Aletta Edouard [phone]) on 03/17/2020 9:01:28 AM   Radiology DG Chest Port 1 View  Result Date: 03/17/2020 CLINICAL DATA:  Shortness of breath EXAM: PORTABLE CHEST 1 VIEW COMPARISON:  03/02/2020 FINDINGS: There is hyperinflation of the lungs compatible with COPD. Bibasilar scarring. Heart is upper limits normal in size. No effusions or acute bony abnormality. IMPRESSION: COPD/chronic changes.  No active disease. Electronically Signed   By: Rolm Baptise M.D.   On: 03/17/2020 09:10    Procedures Procedures (including critical care time)  Medications  Ordered in ED Medications  methylPREDNISolone sodium succinate (SOLU-MEDROL) 125 mg/2 mL injection 125 mg (125 mg Intravenous Given 03/17/20 0930)  albuterol (VENTOLIN HFA) 108 (90 Base) MCG/ACT inhaler 4 puff (4 puffs Inhalation Given 03/17/20 [id number])    ED Course  I have reviewed the triage vital signs and the nursing notes.  Pertinent labs & imaging results that were available during my care of the patient were reviewed by me and considered in my medical decision making (see chart for details).  Clinical Course as of Mar 17 1925  Fri Mar 17, 2020  1924 GFR, Est African American: >60 [MB]    Clinical Course User Index [MB] Hayden Rasmussen, MD   MDM Rules/Calculators/A&P                     This patient complains of shortness of breath and dyspnea on exertion; this involves an extensive number of treatment Options and is a complaint that carries with it a high risk of complications and Morbidity. The differential includes COPD, CHF, pneumonia, Covid, PE, anemia, metabolic derangement  I ordered, reviewed and interpreted labs, which included normal white count baseline hemoglobin normal chemistries other than elevated glucose 125.  Troponin delta unchanged.  BNP low normal I ordered medication steroids inhalation treatments I ordered imaging studies which included chest x-ray and I independently    visualized and interpreted imaging which showed no acute infiltrate Previous records obtained and reviewed in epic including last ED admission  After the interventions stated above, I reevaluated the patient and found patient's shortness of breath improved.  He feels comfortable with discharge.  Return instructions discussed.  Marquan Hagin was evaluated in Emergency Department on 03/17/2020 for the symptoms described in the history of present illness. He was evaluated in the context of the global COVID-19 pandemic, which necessitated consideration that the patient might be at risk for infection  with the SARS-CoV-2 virus that causes COVID-19. Institutional protocols and algorithms that pertain to the evaluation of patients at risk for COVID-19 are in a state of rapid change based on information released by regulatory  bodies including the CDC and federal and state organizations. These policies and algorithms were followed during the patient's care in the ED.  Final Clinical Impression(s) / ED Diagnoses Final diagnoses:  COPD exacerbation ([place])    Rx / DC Orders ED Discharge Orders         Ordered    predniSONE (DELTASONE) 20 MG tablet  Daily     03/17/20 1142           Hayden Rasmussen, MD 03/17/20 1926

## 2020-03-20 ENCOUNTER — Other Ambulatory Visit: Payer: Self-pay

## 2020-03-20 ENCOUNTER — Encounter: Payer: Self-pay | Admitting: Cardiology

## 2020-03-20 ENCOUNTER — Ambulatory Visit: Payer: Medicare PPO | Admitting: Cardiology

## 2020-03-20 VITALS — BP 162/82 | HR 86 | Ht 65.0 in | Wt 260.0 lb

## 2020-03-20 DIAGNOSIS — G4733 Obstructive sleep apnea (adult) (pediatric): Secondary | ICD-10-CM

## 2020-03-20 DIAGNOSIS — I2693 Single subsegmental pulmonary embolism without acute cor pulmonale: Secondary | ICD-10-CM

## 2020-03-20 DIAGNOSIS — E1169 Type 2 diabetes mellitus with other specified complication: Secondary | ICD-10-CM | POA: Insufficient documentation

## 2020-03-20 DIAGNOSIS — I2510 Atherosclerotic heart disease of native coronary artery without angina pectoris: Secondary | ICD-10-CM | POA: Insufficient documentation

## 2020-03-20 DIAGNOSIS — I10 Essential (primary) hypertension: Secondary | ICD-10-CM | POA: Diagnosis not present

## 2020-03-20 DIAGNOSIS — I5032 Chronic diastolic (congestive) heart failure: Secondary | ICD-10-CM | POA: Diagnosis not present

## 2020-03-20 DIAGNOSIS — R011 Cardiac murmur, unspecified: Secondary | ICD-10-CM

## 2020-03-20 DIAGNOSIS — J449 Chronic obstructive pulmonary disease, unspecified: Secondary | ICD-10-CM

## 2020-03-20 DIAGNOSIS — E785 Hyperlipidemia, unspecified: Secondary | ICD-10-CM | POA: Insufficient documentation

## 2020-03-20 MED ORDER — ROSUVASTATIN CALCIUM 40 MG PO TABS
40.0000 mg | ORAL_TABLET | Freq: Every day | ORAL | 3 refills | Status: DC
Start: 2020-03-20 — End: 2021-04-13

## 2020-03-20 NOTE — Progress Notes (Signed)
Primary Care Provider: [name], DO Cardiologist: No primary care provider on file. Electrophysiologist: None  Clinic Note: Chief Complaint  Patient presents with  . Follow-up    Coronary CT angiogram results  . Hospitalization Follow-up    3 admissions and 1 ER visit for COPD exacerbation    HPI:    Vincent Cooper is a 79 y.o. male with a PMH notable for obesity (morbid), OSA on CPAP, hypertension, hyperlipidemia, DM-2 with documented hypertensive heart disease with recently diagnosed segmental pulmonary emboli who is being seen today for the follow-up evaluation for SHORTNESS OF BREATH AND CHEST PAIN at the request of [name], DO.  Vincent Cooper was seen for this consultation on January 19, 2020 (of note he went to the emergency room that night for COPD exacerbation).  Noted to be hypertensive tensive and tachycardic --Decrease lisinopril to 20 mg daily, start diltiazem 180 mg daily.  Start furosemide 40 mg twice daily then once daily for 3 days then reduce to 20 mg daily.. --> Coronary CTA ordered to evaluate for coronary disease.  Recent Hospitalizations:   Vincent Cooper admission 12/19-20/2020: Presented to the ER with worsening shortness of breath.  Had elevated D-dimer, referred for CT angio of the chest which revealed both segmental and subsegmental PE with no evidence of RV strain.  Started initially on IV heparin then transitioned to Eliquis 2D echo ordered showing normal EF with mild aortic valve sclerosis.  Treated with Ellipta and albuterol for COPD.  No antibiotics.  ER visit November 13, 2019: ER visit with shortness of breath, thought to be COPD exacerbation.  Notable drop in borderline oxygen saturation with ambulation.  With notable having audible wheezing on exam without stethoscope.  Indicated strict compliance with anticoagulation.  Symptoms improved after inhalers and steroid therapy.  Discharged home with a course of prednisone. ->  Negative COVID-19 screen   December 30, 2018 ED visit [facility]: Increasing wheezing and shortness of breath for a day.  Was using albuterol and Atrovent without significant benefit, but with increased frequency.  Woke up dyspneic.  Came in via EMS. ->  Noted to have complete resolution of symptoms after nebulizer and Solu-Medrol.  COVID-19, as well as influenza AMB negative.  No evidence of volume overloaded.  ER visit January 06, 2020: 2 having 3 days of worsening dyspnea.  Similar to previous presentation.  No notable benefit with home steroids/inhalers.  SINCE LAST VISIT  ER for hospitalization 3/3-03/2020 for COPD exacerbation-acute restaurant failure with hypoxia/COPD exacerbation.  Have productive coughing spells suggesting bronchiectasis.  She was treated with mucolytic and antibiotics.   Overnight observation for COPD exacerbation 4/15-16/2021: Worsening cough and dyspnea.  Treated with steroids and nebulizer.  Clinically relatively mild COPD exacerbation.  Was weaned off of oxygen after treatment, maintaining 91 to 92% on supplemental oxygen.  ER March 17, 2020--noted worsening dyspnea as he weaned off his prednisone and oxygen levels were weaning down to the 80s with ambulation.  Was not using home oxygen.  Reviewed  CV studies:    The following studies were reviewed today: (if available, images/films reviewed: From Epic Chart or Care Everywhere)  03/08/2020-CORONARY CT ANGIOGRAM: Coronary calcium score 1651. CAD-RADS 4 - Severe.  Suggestion of>50% LM stenosis --> sent for CTFFR -- no PHYSIOLOGICALLY SIGNIFICANT STENOSIS.  Large Dom RCA-<PDA/PLA.  Diffuse mild to moderate plaque (25-49%) proximal segment and moderate (50-69%) mid-distal;  CTFFR: prox 0.95, mid 0.88, distal 0.86 (not significant)   LM -moderate fixed predominant calcified plaque  in the ostium suggesting 50 to 69%.  Distally moderate plaque 25 to 49%.  -->CTFFR = 1.0 (not significant)  LAD: Medium sized vessel long diffuse moderate to  severe plaque calcified plaque in proximal midportion estimated 50 -69% and possible focal stenosis of >70. CTFFR: prox 0.95, mid 0.88, distal 0.86 (not significant)  LCx -small, nondominant with 1 OM 1 branch.  Moderate mixed plaque in the proximal LCx and OM 1 (50-69%); CTFFR = 0.94 (not significant)  Interval History:   Vincent Cooper presents here today actually indicating that he looks and feels much better than my last saw him.  He thinks the last round of antibiotics and steroids really helped him out a lot.  He is trying to be more active, but is definitely fatigued.  He gets short of breath doing just about anything, but is now noticing less of the coughing.  (When he has had bad coughing and shortness of breath spells in the past, he has felt lightheaded and dizzy.)  Also without any coughing, is not having any chest discomfort.  He is not always on home oxygen, but does use CPAP at night.  It definitely makes difference with his breathing.  If he does not use it, he will have significant orthopnea but has minimal edema.  CV Review of Symptoms (Summary) positive for - dyspnea on exertion, edema, orthopnea, paroxysmal nocturnal dyspnea, shortness of breath and Fatigue, dizziness -> but notably improved from last visit. negative for - chest pain, irregular heartbeat, loss of consciousness, palpitations, rapid heart rate or Near syncope.  TIA/amaurosis fugax, claudication.  The patient DOES NOT have symptoms concerning for COVID-19 infection (fever, chills, cough, or new shortness of breath).  He has tested Covid negative patients. The patient is practicing social distancing & Masking.   He is concerned about actually getting the vaccine with his underlying pulmonary condition.  I recommend that he discuss with his pulmonologist.  REVIEWED OF SYSTEMS   Review of Systems  Constitutional: Negative for diaphoresis (Occasionally with shortness of breath) and malaise/fatigue (Overall energy level  is better).  HENT: Negative for congestion and nosebleeds.   Respiratory: Positive for cough (Notably improved) and shortness of breath (Back to his "baseline "). Negative for sputum production.   Cardiovascular:       Per HPI  Gastrointestinal: Positive for abdominal pain. Negative for blood in stool, heartburn and melena.  Genitourinary: Negative for hematuria.  Musculoskeletal: Positive for joint pain. Negative for falls and myalgias.  Neurological: Positive for dizziness, tingling and sensory change (Because of dry throat and wheezing.). Negative for headaches.  Psychiatric/Behavioral: Negative for depression (Most difficult to assess) and memory loss (Difficult to assess). The patient is nervous/anxious and has insomnia.   All other systems reviewed and are negative.  I have reviewed and (if needed) personally updated the patient's problem list, medications, allergies, past medical and surgical history, social and family history.   PAST MEDICAL HISTORY   Past Medical History:  Diagnosis Date  . Chronic gout   . COPD (chronic obstructive pulmonary disease) ([place])    Oxygen dependent  . 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 CAD-RADS 4 - Severe.  ? >50% LM stenosis -->CTFFR -- NOT PHYSIOLOGICALLY SIGNIFICANT; Large Dom RCA-<PDA/PLA.  Diffuse mild to mod plaque (25-49%) prox segment and mod (50-69%) mid-distal;  CTFFR: prox 0.95, mid 0.88, distal 0.86 (not significant); Med-sized LAD w/ long diffuse mod-severe plaque calcified plaque  in prox-mid ~ 50 -69%, ? >70% -> CTFFR :   . Diabetes mellitus type II, non insulin dependent ([place])   . Hypertension   . Morbid obesity (HCC)    BMI of 38.5 with multiple risk factors.  . OSA on CPAP   . Pulmonary emboli ([place]) 10/2019   Chest CTA-4 [place] opacification of main PA but there is partially occlusive main posterior RLL and segmental/segmental branches.  Small thrombus  noted in the anterior right middle lobe.  No RV strain.  Scattered aortic atherosclerosis involving great vessels.  Coronary calcification noted.  Marland Kitchen RLS (restless legs syndrome)     PAST SURGICAL HISTORY   Past Surgical History:  Procedure Laterality Date  . Cataract surgery Right     Echocardiogram 10/2019: EF 60 to 65%.  Unable to determine diastolic parameters.  Normal wall motion.  Normal RV size.  Mild LA dilation.  Mild aortic valve sclerosis but no stenosis.  Moderately elevated pulmonary pressures.  (Estimated 40 mmHg)  MEDICATIONS/ALLERGIES   Current Meds  Medication Sig  . albuterol (VENTOLIN HFA) 108 (90 Base) MCG/ACT inhaler Inhale 1 puff into the lungs every 6 (six) hours as needed for wheezing or shortness of breath.  . allopurinol (ZYLOPRIM) 300 MG tablet Take 300 mg by mouth daily.  Marland Kitchen apixaban (ELIQUIS) 5 MG TABS tablet Take 1 tablet (5 mg total) by mouth 2 (two) times daily.  . budesonide-formoterol (SYMBICORT) 160-4.5 MCG/ACT inhaler Inhale 2 puffs into the lungs in the morning and at bedtime.  Marland Kitchen diltiazem (CARDIZEM CD) 180 MG 24 hr capsule Take 2 capsules (360 mg total) by mouth daily. (Patient taking differently: Take 360 mg by mouth at bedtime. )  . furosemide (LASIX) 40 MG tablet Take 1 tablet (40 mg total) by mouth daily. OR AS DIRECTED  . guaiFENesin (MUCINEX) 600 MG 12 hr tablet Take 1 tablet (600 mg total) by mouth 2 (two) times daily.  Marland Kitchen ipratropium-albuterol (DUONEB) 0.5-2.5 (3) MG/3ML SOLN Take 3 mLs by nebulization 3 (three) times daily.  Marland Kitchen lisinopril (ZESTRIL) 40 MG tablet Take 0.5 tablets (20 mg total) by mouth daily. (Patient taking differently: Take 20 mg by mouth at bedtime. )  . metFORMIN (GLUCOPHAGE) 500 MG tablet Take 500 mg by mouth 2 (two) times daily with a meal.  . nitroGLYCERIN (NITROSTAT) 0.4 MG SL tablet Place 1 tablet (0.4 mg total) under the tongue every 5 (five) minutes as needed for chest pain.  . predniSONE (DELTASONE) 20 MG tablet Take 3  tablets (60 mg total) by mouth daily.    Allergies  Allergen Reactions  . Other     Patient reports he was allergic to something in an IV he was given but does not know what the substance was. As of 01/19/2020     SOCIAL HISTORY/FAMILY HISTORY   Social History   Tobacco Use  . Smoking status: Former Research scientist (life sciences)  . Smokeless tobacco: Former Network engineer Use Topics  . Alcohol use: Not Currently  . Drug use: Never   Social History   Social History Narrative  . Not on file   Quit smoking roughly 20 years ago, stopped smokeless tobacco several months ago.  Trying to lose weight with new diet.  Lives at home.   Family History  Problem Relation Age of Onset  . CAD Neg Hx   . Diabetes Neg Hx     OBJCTIVE -PE, EKG, labs   Wt Readings from Last 3 Encounters:  03/20/20 260 lb (117.9 kg)  03/17/20 253 lb 8.5 oz (115 kg)  03/02/20 254 lb 13.6 oz (115.6 kg)    Physical Exam: BP (!) 162/82   Pulse 86   Ht 5\' 5"  (1.651 m)   Wt 260 lb (117.9 kg)   SpO2 96%   BMI 43.27 kg/m   He tells me at home he has somewhat labile blood pressures ranging in the 110/66 mmHg range up to 150/70 mmHg. Physical Exam  Constitutional: He is oriented to person, place, and time. He appears well-developed and well-nourished. No distress.  Morbidly obese gentleman.  Well-groomed.  More comfortable.  HENT:  Head: Normocephalic and atraumatic.  Eyes: Pupils are equal, round, and reactive to light. Conjunctivae and EOM are normal.  Neck: No JVD present.  Really unable to assess JVD because of body habitus, and abnormal breath sounds.  Cardiovascular: Normal rate, regular rhythm and intact distal pulses. PMI is not displaced (Unable to palpate). Exam reveals no gallop and no friction rub.  Murmur heard.  Medium-pitched harsh crescendo-decrescendo early systolic murmur is present with a grade of 1/6 at the upper right sternal border. Pulmonary/Chest: No respiratory distress (Mild distress). He has no  rales. He exhibits no tenderness.  No obvious distress.  Mild baseline increased work of breathing, but nonlabored.  Distant breath sounds.  Abdominal: Soft. Bowel sounds are normal. He exhibits no distension. There is no abdominal tenderness.  Musculoskeletal:        General: Edema (Maybe 1 1-2+ but notably improved.) present. Normal range of motion.     Cervical back: Normal range of motion and neck supple.  Neurological: He is alert and oriented to person, place, and time. No cranial nerve deficit.  Skin: He is not diaphoretic.  Psychiatric: He has a normal mood and affect. His behavior is normal. Judgment and thought content normal.  Vitals reviewed.    Adult ECG Report  Rate: 86;  Rhythm: normal sinus rhythm, premature atrial contractions (PAC) and Normal axis, intervals and durations.  (PACs and bigeminy);   Narrative Interpretation: Otherwise normal EKG  Recent Labs: 09/08/2019: A1c 5.4; H/H 13.3/38.8.  Glucose 98, BUN/Cr 19/1.01, K+ 4.9; normal LFTs.  -->  He should be due for labs to be checked soon.  TC 231, TG 146, HDL 82, LDL 161. 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   Most effective treatment for  pulmonary hypertension that is likely related to OSA and COPD supplemental oxygen.  We have added diltiazem for rate control and microvascular disease.      Essential hypertension (Chronic)    He seems to have labile blood pressures.  We have cut  his lisinopril dose down to add diltiazem, however I do not think we necessarily need to do it fully.  See instructions for lisinopril.  We have also added standing dose of diuretic.      Relevant Medications   rosuvastatin (CRESTOR) 40 MG tablet   Morbid obesity (HCC) (Chronic)    Self fulfilling prophecy with weight and other comorbidities.  These lose weight, having a hard time walking because of joint pains and dyspnea. I think would benefit from water aerobics.      Chronic diastolic HF (heart failure) (HCC) - Primary (Chronic)    He probably has combination of hypertension disease along with microvascular disease given the extent of coronary calcification.  Echo did not suggest significant diastolic, but he probably does have a decent complement.  There was a mild left atrial enlargement which would argue against significant dysfunction.  Plan:  Heart rate is better with the diltiazem XT 360 mg, but he probably does not need the lisinopril dose reduced.  Increase lisinopril back to full tablet-- Instruction for taking  Lisinopril  If systolic blood pressure is lower than 130 - take 1/2 tablet of lisinopril that day  If blood pressure is above 130  - take a full dose of lisinopril  That day   Recommended that his dry weight be just about 3 to 4 pounds less and is here today (check one at home.) Sliding scale Lasix: Weigh yourself when you get home, then Daily in the Morning. Your dry weight will be what your scale says on the day you return home.(here is 260 lbs.).   If you gain more than 3 to 4 pounds  from dry weight: Increase the Lasix dosing to 80 mg in the morning  until weight returns to baseline dry weight.  If the weight goes  down more than 3 pounds from dry weight: Hold Lasix until it returns to baseline dry weight      Relevant Medications   rosuvastatin (CRESTOR) 40 MG tablet   Other Relevant Orders   EKG 12-Lead (Completed)   Comprehensive metabolic panel (Completed)   Comprehensive metabolic panel   Coronary artery disease, non-occlusive (Chronic)    Calcified and mixed plaque seen diffusely with significant elevated coronary calcium score.  I suspect that the concern in the left main lesion was because of blooming artifact.  CT of heart did not suggest significant findings.  For now, he is not actually having chest tightness or pressure.  It is possible the CT of is accurate, however very unlikely that the LAD FFR would be not significant with a significant LAD.  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  Plan:   With COPD, will continue to calcium channel blocker as opposed to beta-blocker.  More aggressive lipid management.  Starting statin.  Continue lisinopril for afterload reduction-went back to original dose.  (I suspect that he may very well need to be on 40 mg regardless)      Relevant Medications   rosuvastatin (CRESTOR) 40 MG tablet   Other Relevant Orders   Lipid panel (Completed)   Comprehensive metabolic panel (Completed)   Lipid panel   Comprehensive metabolic panel   Hyperlipidemia with target LDL less than 70 (Chronic)    With extensive coronary calcification, he needs more aggressive lipid management.  LDL was extremely high  Plan: Start rosuvastatin 40 mg daily.  He will need lipids checked now and then in roughly 3 to 4 months.  Relevant Medications   rosuvastatin (CRESTOR) 40 MG tablet   Other Relevant Orders   Lipid panel (Completed)   Comprehensive metabolic panel (Completed)   Lipid panel   Comprehensive metabolic panel       [id number] Education: The signs and symptoms of COVID-19 were discussed with the patient and how to seek  care for testing (follow up with PCP or arrange E-visit).   The importance of social distancing was discussed today.  I spent a total of 24 minutes with the patient. >  50% of the time was spent in direct patient consultation.  Additional time spent with chart review  / charting (studies, outside notes, etc): 10 Total Time: 43min   Current medicines are reviewed at length with the patient today.  (+/- concerns) n/a   Patient Instructions / Medication Changes & Studies & Tests Ordered   Patient Instructions  Medication Instructions:   Start taking Crestor 40 mg  ( Rosuvastatin ) one tablet daily   Instruction for taking  Lisinopril  If systolic blood pressure is lower than 130 - take 1/2 tablet of lisinopril that day  If blood pressure is above 130  - take a full dose of lisinopril  That day    Sliding scale Lasix: Weigh yourself when you get home, then Daily in the Morning. Your dry weight will be what your scale says on the day you return home.(here is 260 lbs.).   If you gain more than 3 to 4 pounds  from dry weight: Increase the Lasix dosing to 80 mg in the morning  until weight returns to baseline dry weight.  If the weight goes down more than 3 pounds from dry weight: Hold Lasix until it returns to baseline dry weight   *If you need a refill on your cardiac medications before your next appointment, please call your pharmacy*   Lab Work: cmp  Lipid - now  cmp , lipid in 3 months  - you may have labs done at primary  ,please have them send result to the office  If you have labs (blood work) drawn today and your tests are completely normal, you will receive your results only by: Marland Kitchen MyChart Message (if you have MyChart) OR . A paper copy in the mail If you have any lab test that is abnormal or we need to change your treatment, we will call you to review the results.   Testing/Procedures: Not needed  Follow-Up: At [facility], you and your health needs are our  priority.  As part of our continuing mission to provide you with exceptional heart care, we have created designated Provider Care Teams.  These Care Teams include your primary Cardiologist (physician) and Advanced Practice Providers (APPs -  Physician Assistants and Nurse Practitioners) who all work together to provide you with the care you need, when you need it.  We recommend signing up for the patient portal called "MyChart".  Sign up information is provided on this After Visit Summary.  MyChart is used to connect with patients for Virtual Visits (Telemedicine).  Patients are able to view lab/test results, encounter notes, upcoming appointments, etc.  Non-urgent messages can be sent to your provider as well.   To learn more about what you can do with MyChart, go to [email or web address].    Your next appointment:   6 month(s)  The format for your next appointment:   In Person  Provider:   Glenetta Hew, MD   Other Instructions Discuss with your primary -  the cost of medication _ELIQUIS   - you can change to Xarelto, or Pradaxa or Warfarin   (  Ask for  Patient assistance for Eliquis)   It is a good time for you to have covid vaccine    Studies Ordered:   Orders Placed This Encounter  Procedures  . Lipid panel  . Comprehensive metabolic panel  . Lipid panel  . Comprehensive metabolic panel  . EKG 12-Lead     Glenetta Hew, M.D., M.S. Interventional Cardiologist   Pager # [phone] Phone # [phone] [address] [place], [place] [address]   Thank you for choosing Heartcare at [facility]!!

## 2020-03-20 NOTE — Patient Instructions (Signed)
Medication Instructions:   Start taking Crestor 40 mg  ( Rosuvastatin ) one tablet daily   Instruction for taking  Lisinopril  If systolic blood pressure is lower than 130 - take 1/2 tablet of lisinopril that day  If blood pressure is above 130  - take a full dose of lisinopril  That day    Sliding scale Lasix: Weigh yourself when you get home, then Daily in the Morning. Your dry weight will be what your scale says on the day you return home.(here is 260 lbs.).   If you gain more than 3 to 4 pounds  from dry weight: Increase the Lasix dosing to 80 mg in the morning  until weight returns to baseline dry weight.  If the weight goes down more than 3 pounds from dry weight: Hold Lasix until it returns to baseline dry weight   *If you need a refill on your cardiac medications before your next appointment, please call your pharmacy*   Lab Work: cmp  Lipid - now  cmp , lipid in 3 months  - you may have labs done at primary  ,please have them send result to the office  If you have labs (blood work) drawn today and your tests are completely normal, you will receive your results only by: Marland Kitchen MyChart Message (if you have MyChart) OR . A paper copy in the mail If you have any lab test that is abnormal or we need to change your treatment, we will call you to review the results.   Testing/Procedures: Not needed  Follow-Up: At [facility], you and your health needs are our priority.  As part of our continuing mission to provide you with exceptional heart care, we have created designated Provider Care Teams.  These Care Teams include your primary Cardiologist (physician) and Advanced Practice Providers (APPs -  Physician Assistants and Nurse Practitioners) who all work together to provide you with the care you need, when you need it.  We recommend signing up for the patient portal called "MyChart".  Sign up information is provided on this After Visit Summary.  MyChart is used to connect with  patients for Virtual Visits (Telemedicine).  Patients are able to view lab/test results, encounter notes, upcoming appointments, etc.  Non-urgent messages can be sent to your provider as well.   To learn more about what you can do with MyChart, go to [email or web address].    Your next appointment:   6 month(s)  The format for your next appointment:   In Person  Provider:   Glenetta Hew, MD   Other Instructions Discuss with your primary - the cost of medication _ELIQUIS   - you can change to Xarelto, or Pradaxa or Warfarin   (  Ask for  Patient assistance for Eliquis)   It is a good time for you to have covid vaccine

## 2020-03-21 LAB — COMPREHENSIVE METABOLIC PANEL
ALT: 24 IU/L (ref 0–44)
AST: 16 IU/L (ref 0–40)
Albumin/Globulin Ratio: 2 (ref 1.2–2.2)
Albumin: 4.1 g/dL (ref 3.7–4.7)
Alkaline Phosphatase: 70 IU/L (ref 39–117)
BUN/Creatinine Ratio: 26 — ABNORMAL HIGH (ref 10–24)
BUN: 22 mg/dL (ref 8–27)
Bilirubin Total: 0.4 mg/dL (ref 0.0–1.2)
CO2: 24 mmol/L (ref 20–29)
Calcium: 10.1 mg/dL (ref 8.6–10.2)
Chloride: 101 mmol/L (ref 96–106)
Creatinine, Ser: 0.84 mg/dL (ref 0.76–1.27)
GFR calc Af Amer: 97 mL/min/{1.73_m2} (ref >59)
GFR calc non Af Amer: 84 mL/min/{1.73_m2} (ref >59)
Globulin, Total: 2.1 g/dL (ref 1.5–4.5)
Glucose: 102 mg/dL — ABNORMAL HIGH (ref 65–99)
Potassium: 4.3 mmol/L (ref 3.5–5.2)
Sodium: 141 mmol/L (ref 134–144)
Total Protein: 6.2 g/dL (ref 6.0–8.5)

## 2020-03-21 LAB — LIPID PANEL
Chol/HDL Ratio: 2.7 ratio (ref 0.0–5.0)
Cholesterol, Total: 264 mg/dL — ABNORMAL HIGH (ref 100–199)
HDL: 99 mg/dL (ref >39)
LDL Chol Calc (NIH): 135 mg/dL — ABNORMAL HIGH (ref 0–99)
Triglycerides: 176 mg/dL — ABNORMAL HIGH (ref 0–149)
VLDL Cholesterol Cal: 30 mg/dL (ref 5–40)

## 2020-03-23 ENCOUNTER — Encounter: Payer: Self-pay | Admitting: Cardiology

## 2020-03-23 NOTE — Assessment & Plan Note (Signed)
Calcified and mixed plaque seen diffusely with significant elevated coronary calcium score.  I suspect that the concern in the left main lesion was because of blooming artifact.  CT of heart did not suggest significant findings.  For now, he is not actually having chest tightness or pressure.  It is possible the CT of is accurate, however very unlikely that the LAD FFR would be not significant with a significant LAD.  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  Plan:   With COPD, will continue to calcium channel blocker as opposed to beta-blocker.  More aggressive lipid management.  Starting statin.  Continue lisinopril for afterload reduction-went back to original dose.  (I suspect that he may very well need to be on 40 mg regardless)

## 2020-03-23 NOTE — Assessment & Plan Note (Signed)
He probably has combination of hypertension disease along with microvascular disease given the extent of coronary calcification.  Echo did not suggest significant diastolic, but he probably does have a decent complement.  There was a mild left atrial enlargement which would argue against significant dysfunction.  Plan:  Heart rate is better with the diltiazem XT 360 mg, but he probably does not need the lisinopril dose reduced.  Increase lisinopril back to full tablet-- Instruction for taking  Lisinopril  If systolic blood pressure is lower than 130 - take 1/2 tablet of lisinopril that day  If blood pressure is above 130  - take a full dose of lisinopril  That day   Recommended that his dry weight be just about 3 to 4 pounds less and is here today (check one at home.) Sliding scale Lasix: Weigh yourself when you get home, then Daily in the Morning. Your dry weight will be what your scale says on the day you return home.(here is 260 lbs.).   If you gain more than 3 to 4 pounds  from dry weight: Increase the Lasix dosing to 80 mg in the morning  until weight returns to baseline dry weight.  If the weight goes down more than 3 pounds from dry weight: Hold Lasix until it returns to baseline dry weight

## 2020-03-23 NOTE — Assessment & Plan Note (Signed)
Likely has a component of obesity/hypoventilation syndrome with echo did not suggest pulmonary hypertension.   Most effective treatment for pulmonary hypertension that is likely related to OSA and COPD supplemental oxygen.  We have added diltiazem for rate control and microvascular disease.

## 2020-03-23 NOTE — Assessment & Plan Note (Signed)
With extensive coronary calcification, he needs more aggressive lipid management.  LDL was extremely high  Plan: Start rosuvastatin 40 mg daily.  He will need lipids checked now and then in roughly 3 to 4 months.

## 2020-03-23 NOTE — Assessment & Plan Note (Signed)
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

## 2020-03-23 NOTE — Assessment & Plan Note (Signed)
Self fulfilling prophecy with weight and other comorbidities.  These lose weight, having a hard time walking because of joint pains and dyspnea. I think would benefit from water aerobics.

## 2020-03-23 NOTE — Assessment & Plan Note (Signed)
He seems to have labile blood pressures.  We have cut his lisinopril dose down to add diltiazem, however I do not think we necessarily need to do it fully.  See instructions for lisinopril.  We have also added standing dose of diuretic.

## 2020-06-17 ENCOUNTER — Inpatient Hospital Stay (HOSPITAL_COMMUNITY)
Admission: EM | Admit: 2020-06-17 | Discharge: 2020-06-20 | DRG: 603 | Disposition: A | Discharge status: Home Health | Payer: Medicare PPO | Attending: Internal Medicine | Admitting: Internal Medicine

## 2020-06-17 ENCOUNTER — Emergency Department (HOSPITAL_COMMUNITY): Payer: Medicare PPO

## 2020-06-17 ENCOUNTER — Other Ambulatory Visit: Payer: Self-pay

## 2020-06-17 ENCOUNTER — Encounter (HOSPITAL_COMMUNITY): Payer: Self-pay | Admitting: Emergency Medicine

## 2020-06-17 DIAGNOSIS — G4733 Obstructive sleep apnea (adult) (pediatric): Secondary | ICD-10-CM | POA: Diagnosis present

## 2020-06-17 DIAGNOSIS — Z86711 Personal history of pulmonary embolism: Secondary | ICD-10-CM | POA: Diagnosis not present

## 2020-06-17 DIAGNOSIS — J441 Chronic obstructive pulmonary disease with (acute) exacerbation: Secondary | ICD-10-CM | POA: Diagnosis present

## 2020-06-17 DIAGNOSIS — E785 Hyperlipidemia, unspecified: Secondary | ICD-10-CM | POA: Diagnosis present

## 2020-06-17 DIAGNOSIS — E119 Type 2 diabetes mellitus without complications: Secondary | ICD-10-CM | POA: Diagnosis present

## 2020-06-17 DIAGNOSIS — I2510 Atherosclerotic heart disease of native coronary artery without angina pectoris: Secondary | ICD-10-CM | POA: Diagnosis present

## 2020-06-17 DIAGNOSIS — I5032 Chronic diastolic (congestive) heart failure: Secondary | ICD-10-CM | POA: Diagnosis present

## 2020-06-17 DIAGNOSIS — I110 Hypertensive heart disease with heart failure: Secondary | ICD-10-CM | POA: Diagnosis present

## 2020-06-17 DIAGNOSIS — Z6841 Body Mass Index (BMI) 40.0 and over, adult: Secondary | ICD-10-CM | POA: Diagnosis not present

## 2020-06-17 DIAGNOSIS — M79606 Pain in leg, unspecified: Secondary | ICD-10-CM | POA: Diagnosis not present

## 2020-06-17 DIAGNOSIS — Z7984 Long term (current) use of oral hypoglycemic drugs: Secondary | ICD-10-CM | POA: Diagnosis not present

## 2020-06-17 DIAGNOSIS — L03119 Cellulitis of unspecified part of limb: Secondary | ICD-10-CM | POA: Diagnosis not present

## 2020-06-17 DIAGNOSIS — L03116 Cellulitis of left lower limb: Principal | ICD-10-CM | POA: Diagnosis present

## 2020-06-17 DIAGNOSIS — J449 Chronic obstructive pulmonary disease, unspecified: Secondary | ICD-10-CM

## 2020-06-17 DIAGNOSIS — M1A9XX0 Chronic gout, unspecified, without tophus (tophi): Secondary | ICD-10-CM | POA: Diagnosis present

## 2020-06-17 DIAGNOSIS — M7989 Other specified soft tissue disorders: Secondary | ICD-10-CM | POA: Diagnosis present

## 2020-06-17 DIAGNOSIS — N179 Acute kidney failure, unspecified: Secondary | ICD-10-CM | POA: Diagnosis present

## 2020-06-17 DIAGNOSIS — L03115 Cellulitis of right lower limb: Secondary | ICD-10-CM | POA: Diagnosis present

## 2020-06-17 DIAGNOSIS — Z87891 Personal history of nicotine dependence: Secondary | ICD-10-CM

## 2020-06-17 DIAGNOSIS — R6521 Severe sepsis with septic shock: Secondary | ICD-10-CM | POA: Diagnosis not present

## 2020-06-17 DIAGNOSIS — A419 Sepsis, unspecified organism: Secondary | ICD-10-CM | POA: Diagnosis not present

## 2020-06-17 DIAGNOSIS — Z9119 Patient's noncompliance with other medical treatment and regimen: Secondary | ICD-10-CM

## 2020-06-17 DIAGNOSIS — Z9981 Dependence on supplemental oxygen: Secondary | ICD-10-CM | POA: Diagnosis not present

## 2020-06-17 DIAGNOSIS — K631 Perforation of intestine (nontraumatic): Secondary | ICD-10-CM | POA: Diagnosis not present

## 2020-06-17 DIAGNOSIS — J9601 Acute respiratory failure with hypoxia: Secondary | ICD-10-CM | POA: Diagnosis not present

## 2020-06-17 DIAGNOSIS — Z20822 Contact with and (suspected) exposure to covid-19: Secondary | ICD-10-CM | POA: Diagnosis present

## 2020-06-17 DIAGNOSIS — E876 Hypokalemia: Secondary | ICD-10-CM

## 2020-06-17 DIAGNOSIS — G2581 Restless legs syndrome: Secondary | ICD-10-CM | POA: Diagnosis present

## 2020-06-17 LAB — CBC
HCT: 30.2 % — ABNORMAL LOW (ref 39.0–52.0)
Hemoglobin: 10.2 g/dL — ABNORMAL LOW (ref 13.0–17.0)
MCH: 32.8 pg (ref 26.0–34.0)
MCHC: 33.8 g/dL (ref 30.0–36.0)
MCV: 97.1 fL (ref 80.0–100.0)
Platelets: 345 10*3/uL (ref 150–400)
RBC: 3.11 MIL/uL — ABNORMAL LOW (ref 4.22–5.81)
RDW: 17.1 % — ABNORMAL HIGH (ref 11.5–15.5)
WBC: 9.7 10*3/uL (ref 4.0–10.5)
nRBC: 0 % (ref 0.0–0.2)

## 2020-06-17 LAB — COMPREHENSIVE METABOLIC PANEL
ALT: 30 U/L (ref 0–44)
AST: 26 U/L (ref 15–41)
Albumin: 3 g/dL — ABNORMAL LOW (ref 3.5–5.0)
Alkaline Phosphatase: 57 U/L (ref 38–126)
Anion gap: 14 (ref 5–15)
BUN: 19 mg/dL (ref 8–23)
CO2: 32 mmol/L (ref 22–32)
Calcium: 8.4 mg/dL — ABNORMAL LOW (ref 8.9–10.3)
Chloride: 90 mmol/L — ABNORMAL LOW (ref 98–111)
Creatinine, Ser: 1.5 mg/dL — ABNORMAL HIGH (ref 0.61–1.24)
GFR calc Af Amer: 51 mL/min — ABNORMAL LOW (ref >60)
GFR calc non Af Amer: 44 mL/min — ABNORMAL LOW (ref >60)
Glucose, Bld: 204 mg/dL — ABNORMAL HIGH (ref 70–99)
Potassium: 2.2 mmol/L — CL (ref 3.5–5.1)
Sodium: 136 mmol/L (ref 135–145)
Total Bilirubin: 1 mg/dL (ref 0.3–1.2)
Total Protein: 6 g/dL — ABNORMAL LOW (ref 6.5–8.1)

## 2020-06-17 LAB — URINALYSIS, ROUTINE W REFLEX MICROSCOPIC
Bilirubin Urine: NEGATIVE
Glucose, UA: 50 mg/dL — AB
Ketones, ur: NEGATIVE mg/dL
Leukocytes,Ua: NEGATIVE
Nitrite: NEGATIVE
Protein, ur: 30 mg/dL — AB
Specific Gravity, Urine: 1.01 (ref 1.005–1.030)
pH: 5 (ref 5.0–8.0)

## 2020-06-17 LAB — HEMOGLOBIN A1C
Hgb A1c MFr Bld: 5.4 % (ref 4.8–5.6)
Mean Plasma Glucose: 108.28 mg/dL

## 2020-06-17 LAB — FERRITIN: Ferritin: 765 ng/mL — ABNORMAL HIGH (ref 24–336)

## 2020-06-17 LAB — IRON AND TIBC
Iron: 80 ug/dL (ref 45–182)
Saturation Ratios: 26 % (ref 17.9–39.5)
TIBC: 303 ug/dL (ref 250–450)
UIBC: 223 ug/dL

## 2020-06-17 LAB — GLUCOSE, CAPILLARY
Glucose-Capillary: 178 mg/dL — ABNORMAL HIGH (ref 70–99)
Glucose-Capillary: 194 mg/dL — ABNORMAL HIGH (ref 70–99)

## 2020-06-17 LAB — VITAMIN B12: Vitamin B-12: 467 pg/mL (ref 180–914)

## 2020-06-17 LAB — SARS CORONAVIRUS 2 BY RT PCR (HOSPITAL ORDER, PERFORMED IN ~~LOC~~ HOSPITAL LAB): SARS Coronavirus 2: NEGATIVE

## 2020-06-17 LAB — BRAIN NATRIURETIC PEPTIDE: B Natriuretic Peptide: 87 pg/mL (ref 0.0–100.0)

## 2020-06-17 MED ORDER — ACETAMINOPHEN 325 MG PO TABS: 650.0000 mg | ORAL_TABLET | Freq: Four times a day (QID) | ORAL | Status: DC | PRN

## 2020-06-17 MED ORDER — POTASSIUM CHLORIDE CRYS ER 20 MEQ PO TBCR
40.0000 meq | EXTENDED_RELEASE_TABLET | Freq: Once | ORAL | Status: AC
  Administered 2020-06-17: 40 meq via ORAL
  Filled 2020-06-17: qty 2

## 2020-06-17 MED ORDER — INSULIN ASPART 100 UNIT/ML ~~LOC~~ SOLN
0.0000 [IU] | Freq: Three times a day (TID) | SUBCUTANEOUS | Status: DC
  Administered 2020-06-17 – 2020-06-18 (×2): 2 [IU] via SUBCUTANEOUS
  Administered 2020-06-18: 3 [IU] via SUBCUTANEOUS
  Administered 2020-06-19: 5 [IU] via SUBCUTANEOUS
  Administered 2020-06-19: 2 [IU] via SUBCUTANEOUS
  Administered 2020-06-19: 1 [IU] via SUBCUTANEOUS

## 2020-06-17 MED ORDER — POTASSIUM CHLORIDE 10 MEQ/100ML IV SOLN
10.0000 meq | INTRAVENOUS | Status: AC
  Administered 2020-06-17 (×3): 10 meq via INTRAVENOUS
  Filled 2020-06-17 (×3): qty 100

## 2020-06-17 MED ORDER — BUDESONIDE 0.5 MG/2ML IN SUSP
0.5000 mg | Freq: Two times a day (BID) | RESPIRATORY_TRACT | Status: DC
  Administered 2020-06-17 – 2020-06-20 (×6): 0.5 mg via RESPIRATORY_TRACT
  Filled 2020-06-17 (×6): qty 2

## 2020-06-17 MED ORDER — ONDANSETRON HCL 4 MG/2ML IJ SOLN: 4.0000 mg | Freq: Four times a day (QID) | INTRAMUSCULAR | Status: DC | PRN

## 2020-06-17 MED ORDER — SODIUM CHLORIDE 0.9 % IV BOLUS
500.0000 mL | Freq: Once | INTRAVENOUS | Status: AC
  Administered 2020-06-17: 500 mL via INTRAVENOUS

## 2020-06-17 MED ORDER — INSULIN ASPART 100 UNIT/ML ~~LOC~~ SOLN
0.0000 [IU] | Freq: Every day | SUBCUTANEOUS | Status: DC
  Administered 2020-06-18: 2 [IU] via SUBCUTANEOUS
  Administered 2020-06-19: 5 [IU] via SUBCUTANEOUS

## 2020-06-17 MED ORDER — DILTIAZEM HCL ER COATED BEADS 180 MG PO CP24
360.0000 mg | ORAL_CAPSULE | Freq: Every day | ORAL | Status: DC
  Administered 2020-06-17 – 2020-06-19 (×3): 360 mg via ORAL
  Filled 2020-06-17 (×4): qty 2
  Filled 2020-06-17 (×2): qty 1

## 2020-06-17 MED ORDER — ROSUVASTATIN CALCIUM 20 MG PO TABS
40.0000 mg | ORAL_TABLET | Freq: Every day | ORAL | Status: DC
  Administered 2020-06-18 – 2020-06-20 (×3): 40 mg via ORAL
  Filled 2020-06-17: qty 1
  Filled 2020-06-17: qty 2
  Filled 2020-06-17: qty 1
  Filled 2020-06-17 (×2): qty 2

## 2020-06-17 MED ORDER — IPRATROPIUM-ALBUTEROL 0.5-2.5 (3) MG/3ML IN SOLN
3.0000 mL | Freq: Four times a day (QID) | RESPIRATORY_TRACT | Status: DC
  Administered 2020-06-17: 3 mL via RESPIRATORY_TRACT
  Filled 2020-06-17: qty 3

## 2020-06-17 MED ORDER — MAGNESIUM SULFATE 2 GM/50ML IV SOLN
2.0000 g | Freq: Once | INTRAVENOUS | Status: AC
  Administered 2020-06-17: 2 g via INTRAVENOUS
  Filled 2020-06-17: qty 50

## 2020-06-17 MED ORDER — IPRATROPIUM-ALBUTEROL 0.5-2.5 (3) MG/3ML IN SOLN
3.0000 mL | Freq: Four times a day (QID) | RESPIRATORY_TRACT | Status: DC
  Administered 2020-06-18 – 2020-06-20 (×9): 3 mL via RESPIRATORY_TRACT
  Filled 2020-06-17 (×10): qty 3

## 2020-06-17 MED ORDER — CEFAZOLIN SODIUM-DEXTROSE 2-4 GM/100ML-% IV SOLN
2.0000 g | Freq: Three times a day (TID) | INTRAVENOUS | Status: DC
  Administered 2020-06-17 – 2020-06-18 (×3): 2 g via INTRAVENOUS
  Filled 2020-06-17 (×3): qty 100

## 2020-06-17 MED ORDER — ALLOPURINOL 300 MG PO TABS: 300.0000 mg | ORAL_TABLET | Freq: Every day | ORAL | Status: DC

## 2020-06-17 MED ORDER — SODIUM CHLORIDE 0.9 % IV SOLN
INTRAVENOUS | Status: DC | PRN
  Administered 2020-06-17: 1000 mL via INTRAVENOUS

## 2020-06-17 MED ORDER — APIXABAN 5 MG PO TABS
5.0000 mg | ORAL_TABLET | Freq: Two times a day (BID) | ORAL | Status: DC
  Administered 2020-06-17 – 2020-06-20 (×6): 5 mg via ORAL
  Filled 2020-06-17 (×6): qty 1

## 2020-06-17 MED ORDER — ACETAMINOPHEN 650 MG RE SUPP: 650.0000 mg | Freq: Four times a day (QID) | RECTAL | Status: DC | PRN

## 2020-06-17 MED ORDER — ONDANSETRON HCL 4 MG PO TABS: 4.0000 mg | ORAL_TABLET | Freq: Four times a day (QID) | ORAL | Status: DC | PRN

## 2020-06-17 NOTE — H&P (Signed)
History and Physical  Auburn Hert ALP:[id number] DOB: 07/07/1941 DOA: 06/17/2020   PCP: [name], DO   Patient coming from: Home  Chief Complaint: leg pain and edema  HPI:  Vincent Cooper is a 79 y.o. male with medical history of morbid obesity, OSA, hypertension, hyperlipidemia, diabetes mellitus type 2, diastolic CHF presenting with 3-week history of leg edema associated with approximately 1 week history of leg erythema.  Patient states that in the past 2 to 3 days he has had worsening erythema.  He went to see his primary care provider on 06/15/2020.  The patient was prescribed 2 antibiotics which he took.  He stated that the redness in his legs continue to migrate proximally.  He had some mild pain.  He has noticed increasing edema over the past 2 to 3 days in his bilateral lower extremities.  He denies any fevers, chills, chest pain, nausea, vomiting, diarrhea.  He has had some worsening dyspnea on exertion.  However, the patient has been very compliant with his furosemide and cardiac medications.  He states that his weight has actually been decreasing from 260 pounds from his last visit with Dr. [name] to 246 pounds on 06/16/2020.  He states that he normally has to sleep in a recliner, but he states that that he is not able to actually lay back somewhat without any orthopnea type symptoms.  He denies any coughing, hemoptysis, recent travels.  There is no nausea, vomiting, diarrhea.  The patient states that his PCP recently decreased his furosemide from 40 mg to 20 mg daily approximately 3 days prior to this admission. In the emergency department, the patient was afebrile hemodynamically stable with oxygen saturation 95-100% room air.  Potassium was 2.2 with serum creatinine 1.50.  LFTs were unremarkable.  WBC 9.7, hemoglobin 10.2, platelets 245,000.  BMP was 87.  Chest x-ray showed chronic interstitial prominence.  The patient was given 500 cc normal saline.  Potassium chloride 40 mEq p.o. and  30 mEq IV were ordered.  Assessment/Plan: Cellulitis--legs -Failed outpatient oral antibiotics -Start cefazolin -Venous duplex lower extremities  COPD exacerbation -Start duo nebs -Start Pulmicort  Chronic diastolic CHF -Hold furosemide today -Reevaluate for restart tomorrow  PE/DVT -Diagnosed 11/07/2019 -Continue apixaban  AKI -Holding furosemide temporarily -A.m. BMP -Baseline creatinine 0.7-1.0 -Presented with serum creatinine 1.50 -Holding lisinopril  Hyperlipidemia -Continue statin  Essential hypertension -Continue diltiazem  Hypokalemia -Replete -Check magnesium  Diabetes mellitus type 2 -01/19/2020 hemoglobin A1c 5.9 -Holding metformin -NovoLog sliding scale   Nonocclusive CAD -No chest pain presently -EKG without concerning ischemic changes  Morbid obesity -Lifestyle modification -BMI 43.27  OSA -Patient is poorly compliant with CPAP at home         Past Medical History:  Diagnosis Date  . Chronic gout   . COPD (chronic obstructive pulmonary disease) ([place])    Oxygen dependent  . 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 CAD-RADS 4 - Severe.  ? >50% LM stenosis -->CTFFR -- NOT PHYSIOLOGICALLY SIGNIFICANT; Large Dom RCA-<PDA/PLA.  Diffuse mild to mod plaque (25-49%) prox segment and mod (50-69%) mid-distal;  CTFFR: prox 0.95, mid 0.88, distal 0.86 (not significant); Med-sized LAD w/ long diffuse mod-severe plaque calcified plaque in prox-mid ~ 50 -69%, ? >70% -> CTFFR :   . Diabetes mellitus type II, non insulin dependent ([place])   . Hypertension   . Morbid obesity ([facility])    BMI of 38.5 with  multiple risk factors.  . OSA on CPAP   . Pulmonary emboli ([place]) 10/2019   Chest CTA-4 [place] opacification of main PA but there is partially occlusive main posterior RLL and segmental/segmental branches.  Small thrombus noted in the anterior right middle lobe.  No RV strain.   Scattered aortic atherosclerosis involving great vessels.  Coronary calcification noted.  Marland Kitchen RLS (restless legs syndrome)    Past Surgical History:  Procedure Laterality Date  . Cataract surgery Right    Social History:  reports that he has quit smoking. He has quit using smokeless tobacco. He reports previous alcohol use. He reports that he does not use drugs.   Family History  Problem Relation Age of Onset  . CAD Neg Hx   . Diabetes Neg Hx      Allergies  Allergen Reactions  . Other     Patient reports he was allergic to something in an IV he was given but does not know what the substance was. As of 01/19/2020      Prior to Admission medications   Medication Sig Start Date End Date Taking? Authorizing Provider  albuterol (VENTOLIN HFA) 108 (90 Base) MCG/ACT inhaler Inhale 1 puff into the lungs every 6 (six) hours as needed for wheezing or shortness of breath.    [provider]  allopurinol (ZYLOPRIM) 300 MG tablet Take 300 mg by mouth daily.    [provider]  apixaban (ELIQUIS) 5 MG TABS tablet Take 1 tablet (5 mg total) by mouth 2 (two) times daily. 01/21/20   Barton Dubois, MD  budesonide-formoterol [facility]) 160-4.5 MCG/ACT inhaler Inhale 2 puffs into the lungs in the morning and at bedtime. 01/21/20   Barton Dubois, MD  diltiazem (CARDIZEM CD) 180 MG 24 hr capsule Take 2 capsules (360 mg total) by mouth daily. Patient taking differently: Take 360 mg by mouth at bedtime.  03/15/20   Leonie Man, MD  furosemide (LASIX) 40 MG tablet Take 1 tablet (40 mg total) by mouth daily. OR AS DIRECTED 01/19/20 04/18/20  Leonie Man, MD  guaiFENesin (MUCINEX) 600 MG 12 hr tablet Take 1 tablet (600 mg total) by mouth 2 (two) times daily. 01/21/20   Barton Dubois, MD  ipratropium-albuterol (DUONEB) 0.5-2.5 (3) MG/3ML SOLN Take 3 mLs by nebulization 3 (three) times daily. 03/03/20   Johnson, Clanford L, MD  lisinopril (ZESTRIL) 40 MG tablet Take 0.5 tablets (20 mg total)  by mouth daily. Patient taking differently: Take 20 mg by mouth at bedtime.  03/15/20   Leonie Man, MD  metFORMIN (GLUCOPHAGE) 500 MG tablet Take 500 mg by mouth 2 (two) times daily with a meal.    [provider]  nitroGLYCERIN (NITROSTAT) 0.4 MG SL tablet Place 1 tablet (0.4 mg total) under the tongue every 5 (five) minutes as needed for chest pain. 01/19/20 04/18/20  Leonie Man, MD  predniSONE (DELTASONE) 20 MG tablet Take 3 tablets (60 mg total) by mouth daily. 03/17/20   Hayden Rasmussen, MD  rosuvastatin (CRESTOR) 40 MG tablet Take 1 tablet (40 mg total) by mouth daily. 03/20/20 06/18/20  Leonie Man, MD    Review of Systems:  Constitutional:  No weight loss, night sweats, Fevers, chills, fatigue.  Head&Eyes: No headache.  No vision loss.  No eye pain or scotoma ENT:  No Difficulty swallowing,Tooth/dental problems,Sore throat,  No ear ache, post nasal drip,  Cardio-vascular:  No chest pain, Orthopnea, PND, swelling in lower extremities,  dizziness, palpitations  GI:  No  abdominal pain, nausea, vomiting, diarrhea, loss of appetite, hematochezia, melena, heartburn, indigestion, Resp:  No shortness of breath with exertion or at rest. No cough. No coughing up of blood .No wheezing.No chest wall deformity  Skin:  no rash or lesions.  GU:  no dysuria, change in color of urine, no urgency or frequency. No flank pain.  Musculoskeletal:  No joint pain or swelling. No decreased range of motion. No back pain.  Psych:  No change in mood or affect. No depression or anxiety. Neurologic: No headache, no dysesthesia, no focal weakness, no vision loss. No syncope  Physical Exam: Vitals:   06/17/20 1103 06/17/20 1104 06/17/20 1230  BP:  (!) 114/98 (!) 109/59  Pulse: 98  94  Resp: 18  17  Temp: 98.7 F (37.1 C)    TempSrc: Oral    SpO2: 100%  95%  Weight:  (!) 117.9 kg   Height:  5\' 5"  (1.651 m)    General:  A&O x 3, NAD, nontoxic, pleasant/cooperative Head/Eye: No  conjunctival hemorrhage, no icterus, [place]/AT, No nystagmus ENT:  No icterus,  No thrush, good dentition, no pharyngeal exudate Neck:  No masses, no lymphadenpathy, no bruits CV:  RRR, no rub, no gallop, no S3 Lung:  Bilateral scattered rales.  Bilateral wheeze Abdomen: soft/NT, +BS, nondistended, no peritoneal signs Ext: No cyanosis, No rashes, No petechiae, 2+LE with erythema RLE>LLE lymphangitis, No edema Neuro: CNII-XII intact, strength 4/5 in bilateral upper and lower extremities, no dysmetria  Labs on Admission:  Basic Metabolic Panel: Recent Labs  Lab 06/17/20 1133  NA 136  K 2.2*  CL 90*  CO2 32  GLUCOSE 204*  BUN 19  CREATININE 1.50*  CALCIUM 8.4*   Liver Function Tests: Recent Labs  Lab 06/17/20 1133  AST 26  ALT 30  ALKPHOS 57  BILITOT 1.0  PROT 6.0*  ALBUMIN 3.0*   No results for input(s): LIPASE, AMYLASE in the last 168 hours. No results for input(s): AMMONIA in the last 168 hours. CBC: Recent Labs  Lab 06/17/20 1120  WBC 9.7  HGB 10.2*  HCT 30.2*  MCV 97.1  PLT 345   Coagulation Profile: No results for input(s): INR, PROTIME in the last 168 hours. Cardiac Enzymes: No results for input(s): CKTOTAL, CKMB, CKMBINDEX, TROPONINI in the last 168 hours. BNP: Invalid input(s): POCBNP CBG: No results for input(s): GLUCAP in the last 168 hours. Urine analysis: No results found for: COLORURINE, APPEARANCEUR, LABSPEC, PHURINE, GLUCOSEU, HGBUR, BILIRUBINUR, KETONESUR, PROTEINUR, UROBILINOGEN, NITRITE, LEUKOCYTESUR Sepsis Labs: @LABRCNTIP (procalcitonin:4,lacticidven:4) )No results found for this or any previous visit (from the past 240 hour(s)).   Radiological Exams on Admission: DG Chest Port 1 View  Result Date: 06/17/2020 CLINICAL DATA:  Bilateral foot swelling EXAM: PORTABLE CHEST 1 VIEW COMPARISON:  03/17/2020 FINDINGS: Stable mild cardiomegaly. Atherosclerotic calcification of the aortic knob. Mildly hyperexpanded lungs coarsened bibasilar  interstitial markings. No focal airspace consolidation. No overt edema. No significant pleural fluid collection. No pneumothorax. IMPRESSION: Stable mild cardiomegaly. No overt edema or focal airspace consolidation. Electronically Signed   By: Davina Poke D.O.   On: 06/17/2020 12:30    EKG: Independently reviewed. Sinus, nonspecific TWI    Time spent:70 minutes Code Status:   FULL Family Communication:  Daughter updated at bedside Disposition Plan: expect 2-3 day hospitalization Consults called: none DVT Prophylaxis: apixaban  Orson Eva, DO  [facility] Pager [phone]  If 7PM-7AM, please contact night-coverage [email or web address] Password [facility] 06/17/2020, 2:11 PM

## 2020-06-17 NOTE — ED Triage Notes (Addendum)
Pt c/o of bilateral leg swelling x 4 days. Saw PCP on Tuesday and was placed on antibiotics with no relief

## 2020-06-17 NOTE — ED Notes (Signed)
CRITICAL VALUE ALERT  Critical Value:  Potassium 2.2  Date & Time Notied: 06/17/2020 @ 1250  Provider Notified: Dr Sedonia Small  Orders Received/Actions taken: Orders to be given

## 2020-06-18 ENCOUNTER — Inpatient Hospital Stay (HOSPITAL_COMMUNITY): Payer: Medicare PPO

## 2020-06-18 DIAGNOSIS — L03119 Cellulitis of unspecified part of limb: Secondary | ICD-10-CM

## 2020-06-18 LAB — GLUCOSE, CAPILLARY
Glucose-Capillary: 108 mg/dL — ABNORMAL HIGH (ref 70–99)
Glucose-Capillary: 211 mg/dL — ABNORMAL HIGH (ref 70–99)
Glucose-Capillary: 150 mg/dL — ABNORMAL HIGH (ref 70–99)
Glucose-Capillary: 209 mg/dL — ABNORMAL HIGH (ref 70–99)

## 2020-06-18 LAB — CBC
HCT: 30.8 % — ABNORMAL LOW (ref 39.0–52.0)
Hemoglobin: 10.1 g/dL — ABNORMAL LOW (ref 13.0–17.0)
MCH: 32.8 pg (ref 26.0–34.0)
MCHC: 32.8 g/dL (ref 30.0–36.0)
MCV: 100 fL (ref 80.0–100.0)
Platelets: 353 10*3/uL (ref 150–400)
RBC: 3.08 MIL/uL — ABNORMAL LOW (ref 4.22–5.81)
RDW: 17.7 % — ABNORMAL HIGH (ref 11.5–15.5)
WBC: 8.7 10*3/uL (ref 4.0–10.5)
nRBC: 0 % (ref 0.0–0.2)

## 2020-06-18 LAB — BASIC METABOLIC PANEL
Anion gap: 13 (ref 5–15)
BUN: 16 mg/dL (ref 8–23)
CO2: 28 mmol/L (ref 22–32)
Calcium: 8.5 mg/dL — ABNORMAL LOW (ref 8.9–10.3)
Chloride: 96 mmol/L — ABNORMAL LOW (ref 98–111)
Creatinine, Ser: 1.16 mg/dL (ref 0.61–1.24)
GFR calc Af Amer: >60 mL/min (ref >60)
GFR calc non Af Amer: 60 mL/min — ABNORMAL LOW (ref >60)
Glucose, Bld: 105 mg/dL — ABNORMAL HIGH (ref 70–99)
Potassium: 2.7 mmol/L — CL (ref 3.5–5.1)
Sodium: 137 mmol/L (ref 135–145)

## 2020-06-18 LAB — MAGNESIUM: Magnesium: 2 mg/dL (ref 1.7–2.4)

## 2020-06-18 LAB — FOLATE: Folate: 10.4 ng/mL (ref >5.9)

## 2020-06-18 MED ORDER — VANCOMYCIN HCL 2000 MG/400ML IV SOLN
2000.0000 mg | Freq: Once | INTRAVENOUS | Status: AC
  Administered 2020-06-18: 2000 mg via INTRAVENOUS
  Filled 2020-06-18: qty 400

## 2020-06-18 MED ORDER — FUROSEMIDE 40 MG PO TABS
40.0000 mg | ORAL_TABLET | Freq: Every day | ORAL | Status: DC
  Administered 2020-06-18 – 2020-06-20 (×3): 40 mg via ORAL
  Filled 2020-06-18 (×3): qty 1

## 2020-06-18 MED ORDER — VANCOMYCIN HCL 1250 MG/250ML IV SOLN
1250.0000 mg | INTRAVENOUS | Status: DC
  Administered 2020-06-19: 1250 mg via INTRAVENOUS
  Filled 2020-06-18: qty 250

## 2020-06-18 MED ORDER — POTASSIUM CHLORIDE CRYS ER 20 MEQ PO TBCR
40.0000 meq | EXTENDED_RELEASE_TABLET | Freq: Four times a day (QID) | ORAL | Status: AC
  Administered 2020-06-18 (×2): 40 meq via ORAL
  Filled 2020-06-18 (×3): qty 2

## 2020-06-18 MED ORDER — POLYETHYLENE GLYCOL 3350 17 G PO PACK
17.0000 g | PACK | Freq: Every day | ORAL | Status: DC
  Administered 2020-06-18 – 2020-06-20 (×3): 17 g via ORAL
  Filled 2020-06-18 (×3): qty 1

## 2020-06-18 MED ORDER — VANCOMYCIN HCL 2000 MG/400ML IV SOLN
2000.0000 mg | Freq: Once | INTRAVENOUS | Status: DC
  Filled 2020-06-18: qty 400

## 2020-06-18 MED ORDER — POTASSIUM CHLORIDE 10 MEQ/100ML IV SOLN
10.0000 meq | INTRAVENOUS | Status: AC
  Administered 2020-06-18 (×2): 10 meq via INTRAVENOUS
  Filled 2020-06-18 (×2): qty 100

## 2020-06-18 MED ORDER — VANCOMYCIN HCL IN DEXTROSE 1-5 GM/200ML-% IV SOLN: 1000.0000 mg | Freq: Once | INTRAVENOUS | Status: DC

## 2020-06-18 NOTE — Progress Notes (Signed)
PROGRESS NOTE  Vincent Cooper XBJ:[id number] DOB: [birth year] DOA: 06/17/2020 PCP: [name], DO  Brief History:  79 y.o. male with medical history of morbid obesity, OSA, hypertension, hyperlipidemia, diabetes mellitus type 2, diastolic CHF presenting with 3-week history of leg edema associated with approximately 1 week history of leg erythema.  Patient states that in the past 2 to 3 days he has had worsening erythema.  He went to see his primary care provider on 06/15/2020.  The patient was prescribed 2 antibiotics which he took.  He stated that the redness in his legs continue to migrate proximally.  He had some mild pain.  He has noticed increasing edema over the past 2 to 3 days in his bilateral lower extremities.  He denies any fevers, chills, chest pain, nausea, vomiting, diarrhea.   the patient has been very compliant with his furosemide and cardiac medications.  He states that his weight has actually been decreasing from 260 pounds from his last visit with Dr. [name] to 246 pounds on 06/16/2020.  He states that he normally has to sleep in a recliner, but he states that that he is not able to actually lay back somewhat without any orthopnea type symptoms.  He denies any coughing, hemoptysis, recent travels.  There is no nausea, vomiting, diarrhea.  The patient states that his PCP recently decreased his furosemide from 40 mg to 20 mg daily approximately 3 days prior to this admission. In the emergency department, the patient was afebrile hemodynamically stable with oxygen saturation 95-100% room air.  Potassium was 2.2 with serum creatinine 1.50.  LFTs were unremarkable.  WBC 9.7, hemoglobin 10.2, platelets 245,000.  BMP was 87.  Chest x-ray showed chronic interstitial prominence.  The patient was given 500 cc normal saline.  Potassium chloride 40 mEq p.o. and 30 mEq IV were ordered.  Patient was started initially on cefazolin.  Assessment/Plan: Cellulitis--legs -Failed outpatient oral  antibiotics -d/c cefazolin -legs have developed small pustules -start vanco -Venous duplex lower extremities--neg  COPD exacerbation -continue duo nebs -continue Pulmicort  Chronic diastolic CHF -restart furosemide today -daily weights  PE/DVT -Diagnosed 11/07/2019 -Continue apixaban  AKI -Holding furosemide temporarily -A.m. BMP -Baseline creatinine 0.7-1.0 -Presented with serum creatinine 1.50 -Holding lisinopril  Hyperlipidemia -Continue statin  Essential hypertension -Continue diltiazem  Hypokalemia -Replete -Check magnesium--2.0  Diabetes mellitus type 2 -06/17/2020 hemoglobin A1c 5.4 -Holding metformin -NovoLog sliding scale  Nonocclusive CAD -No chest pain presently -EKG without concerning ischemic changes  Morbid obesity -Lifestyle modification -BMI 43.27  OSA -Patient is poorly compliant with CPAP at home         Status is: Inpatient  Remains inpatient appropriate because:IV treatments appropriate due to intensity of illness or inability to take PO  No appreciable improvement in cellulitis   Dispo: The patient is from: Home              Anticipated d/c is to: Home              Anticipated d/c date is: 2 days              Patient currently is not medically stable to d/c.        Family Communication: tried to call daughter;  VM is full, could not leave VM  Consultants:  none  Code Status:  FULL  DVT Prophylaxis: apixaban   Procedures: As Listed in Progress Note Above  Antibiotics: Cefazolin 7/31>>>8/1 vanco 8/1>>  Subjective: Patient denies fevers, chills, headache, chest pain, dyspnea, nausea, vomiting, diarrhea, abdominal pain, dysuria, hematuria, hematochezia, and melena. Feels that legs are about same regarding edema.  Cannot tell me if he feels erythema is changed  Objective: Vitals:   06/18/20 0448 06/18/20 0940 06/18/20 1355 06/18/20 1421  BP: (!) 112/53   105/85  Pulse: 80   86  Resp:  16   16  Temp: 98.2 F (36.8 C)   98.6 F (37 C)  TempSrc: Oral   Oral  SpO2: 100% 92% 90% 99%  Weight:      Height:        Intake/Output Summary (Last 24 hours) at 06/18/2020 1605 Last data filed at 06/18/2020 0400 Gross per 24 hour  Intake 443.55 ml  Output --  Net 443.55 ml   Weight change:  Exam:   General:  Pt is alert, follows commands appropriately, not in acute distress  HEENT: No icterus, No thrush, No neck mass, [place]/AT  Cardiovascular: RRR, S1/S2, no rubs, no gallops  Respiratory: bibasilar crackles. No wheeze  Abdomen: Soft/+BS, non tender, non distended, no guarding  Extremities: 2+LE edema, No lymphangitis, No petechiae, No rashes, no synovitis       Data Reviewed: I have personally reviewed following labs and imaging studies Basic Metabolic Panel: Recent Labs  Lab 06/17/20 1133 06/18/20 0603  NA 136 137  K 2.2* 2.7*  CL 90* 96*  CO2 32 28  GLUCOSE 204* 105*  BUN 19 16  CREATININE 1.50* 1.16  CALCIUM 8.4* 8.5*  MG  --  2.0   Liver Function Tests: Recent Labs  Lab 06/17/20 1133  AST 26  ALT 30  ALKPHOS 57  BILITOT 1.0  PROT 6.0*  ALBUMIN 3.0*   No results for input(s): LIPASE, AMYLASE in the last 168 hours. No results for input(s): AMMONIA in the last 168 hours. Coagulation Profile: No results for input(s): INR, PROTIME in the last 168 hours. CBC: Recent Labs  Lab 06/17/20 1120 06/18/20 0603  WBC 9.7 8.7  HGB 10.2* 10.1*  HCT 30.2* 30.8*  MCV 97.1 100.0  PLT 345 353   Cardiac Enzymes: No results for input(s): CKTOTAL, CKMB, CKMBINDEX, TROPONINI in the last 168 hours. BNP: Invalid input(s): POCBNP CBG: Recent Labs  Lab 06/17/20 1606 06/17/20 2130 06/18/20 0828 06/18/20 1218  GLUCAP 178* 194* 108* 211*   HbA1C: Recent Labs    06/17/20 1120  HGBA1C 5.4   Urine analysis:    Component Value Date/Time   COLORURINE YELLOW 06/17/2020 1107   APPEARANCEUR CLEAR 06/17/2020 1107   LABSPEC 1.010 06/17/2020 1107    PHURINE 5.0 06/17/2020 1107   GLUCOSEU 50 (A) 06/17/2020 1107   HGBUR SMALL (A) 06/17/2020 1107   BILIRUBINUR NEGATIVE 06/17/2020 1107   KETONESUR NEGATIVE 06/17/2020 1107   PROTEINUR 30 (A) 06/17/2020 1107   NITRITE NEGATIVE 06/17/2020 1107   LEUKOCYTESUR NEGATIVE 06/17/2020 1107   Sepsis Labs: @LABRCNTIP (procalcitonin:4,lacticidven:4) ) Recent Results (from the past 240 hour(s))  SARS Coronavirus 2 by RT PCR (hospital order, performed in [place] hospital lab) Nasopharyngeal Nasopharyngeal Swab     Status: None   Collection Time: 06/17/20  1:15 PM   Specimen: Nasopharyngeal Swab  Result Value Ref Range Status   SARS Coronavirus 2 NEGATIVE NEGATIVE Final    Comment: (NOTE) SARS-CoV-2 target nucleic acids are NOT DETECTED.  The SARS-CoV-2 RNA is generally detectable in upper and lower respiratory specimens during the acute phase of infection. The lowest concentration of SARS-CoV-2 viral copies this assay  can detect is 250 copies / mL. A negative result does not preclude SARS-CoV-2 infection and should not be used as the sole basis for treatment or other patient management decisions.  A negative result may occur with improper specimen collection / handling, submission of specimen other than nasopharyngeal swab, presence of viral mutation(s) within the areas targeted by this assay, and inadequate number of viral copies (<250 copies / mL). A negative result must be combined with clinical observations, patient history, and epidemiological information.  Fact Sheet for Patients:   StrictlyIdeas.no  Fact Sheet for Healthcare Providers: BankingDealers.co.za  This test is not yet approved or  cleared by the [place] FDA and has been authorized for detection and/or diagnosis of SARS-CoV-2 by FDA under an Emergency Use Authorization (EUA).  This EUA will remain in effect (meaning this test can be used) for the duration of  the COVID-19 declaration under Section 564(b)(1) of the Act, 21 U.S.C. section 360bbb-3(b)(1), unless the authorization is terminated or revoked sooner.  Performed at [facility], [address] [place], [place] [address]      Scheduled Meds:  apixaban  5 mg Oral BID   budesonide (PULMICORT) nebulizer solution  0.5 mg Nebulization BID   diltiazem  360 mg Oral QHS   furosemide  40 mg Oral Daily   insulin aspart  0-5 Units Subcutaneous QHS   insulin aspart  0-9 Units Subcutaneous TID WC   ipratropium-albuterol  3 mL Nebulization Q6H WA   polyethylene glycol  17 g Oral Daily   rosuvastatin  40 mg Oral Daily   Continuous Infusions:  sodium chloride 1,000 mL (06/17/20 1745)   vancomycin 2,000 mg (06/18/20 1522)   Followed by   Derrill Memo ON 06/19/2020] vancomycin      Procedures/Studies: US Venous Img Lower Bilateral  Result Date: 06/17/2020 CLINICAL DATA:  Edema EXAM: BILATERAL LOWER EXTREMITY VENOUS DOPPLER ULTRASOUND TECHNIQUE: Gray-scale sonography with compression, as well as color and duplex ultrasound, were performed to evaluate the deep venous system(s) from the level of the common femoral vein through the popliteal and proximal calf veins. COMPARISON:  None. FINDINGS: VENOUS Normal compressibility of the common femoral, superficial femoral, and popliteal veins, as well as the visualized calf veins. Visualized portions of profunda femoral vein and great saphenous vein unremarkable. No filling defects to suggest DVT on grayscale or color Doppler imaging. Doppler waveforms show normal direction of venous flow, normal respiratory plasticity and response to augmentation. Limited views of the contralateral common femoral vein are unremarkable. OTHER Ill-defined fluid/edema within the subcutaneous soft tissues of both calves. Limitations: none IMPRESSION: 1. No DVT seen, bilateral lower extremities. 2. Fluid/edema within the subcutaneous soft tissues of both calves. Electronically  Signed   By: Franki Cabot M.D.   On: 06/17/2020 14:10   DG Chest Port 1 View  Result Date: 06/17/2020 CLINICAL DATA:  Bilateral foot swelling EXAM: PORTABLE CHEST 1 VIEW COMPARISON:  03/17/2020 FINDINGS: Stable mild cardiomegaly. Atherosclerotic calcification of the aortic knob. Mildly hyperexpanded lungs coarsened bibasilar interstitial markings. No focal airspace consolidation. No overt edema. No significant pleural fluid collection. No pneumothorax. IMPRESSION: Stable mild cardiomegaly. No overt edema or focal airspace consolidation. Electronically Signed   By: Davina Poke D.O.   On: 06/17/2020 12:30    Orson Eva, DO  [facility]  If 7PM-7AM, please contact night-coverage [email or web address] Password [email or web address] 06/18/2020, 4:05 PM   LOS: 1 day

## 2020-06-18 NOTE — Progress Notes (Signed)
Pharmacy Antibiotic Note  Vincent Cooper is a 79 y.o. male admitted on 06/17/2020 with cellulitis.  Pharmacy has been consulted for vancomycin dosing.  Plan: Vancomycin 2000mg  loading dose, then 1250mg   IV every 24 hours.  Goal trough 10-15 mcg/mL.  F/u clinical progress Monitor V/S, labs and levels as indicated  Height: 5\' 5"  (165.1 cm) Weight: (!) 117.9 kg (260 lb) IBW/kg (Calculated) : 61.5  Temp (24hrs), Avg:98.6 F (37 C), Min:98.1 F (36.7 C), Max:99.1 F (37.3 C)  Recent Labs  Lab 06/17/20 1120 06/17/20 1133 06/18/20 0603  WBC 9.7  --  8.7  CREATININE  --  1.50* 1.16    Estimated Creatinine Clearance: 62.4 mL/min (by C-G formula based on SCr of 1.16 mg/dL).   Normalized CrCL is 20mls/min  Allergies  Allergen Reactions  . Other     Patient reports he was allergic to something in an IV he was given but does not know what the substance was. As of 01/19/2020     Antimicrobials this admission: Vancomycin 8/1 >>    Dose adjustments this admission: prn  Microbiology results: No culture  Thank you for allowing pharmacy to be a part of this patient's care.  Isac Sarna, BS [place], [place] Clinical Pharmacist Pager [phone] 06/18/2020 3:03 PM

## 2020-06-19 LAB — BASIC METABOLIC PANEL
Anion gap: 8 (ref 5–15)
BUN: 14 mg/dL (ref 8–23)
CO2: 28 mmol/L (ref 22–32)
Calcium: 8 mg/dL — ABNORMAL LOW (ref 8.9–10.3)
Chloride: 101 mmol/L (ref 98–111)
Creatinine, Ser: 1.21 mg/dL (ref 0.61–1.24)
GFR calc Af Amer: >60 mL/min (ref >60)
GFR calc non Af Amer: 57 mL/min — ABNORMAL LOW (ref >60)
Glucose, Bld: 145 mg/dL — ABNORMAL HIGH (ref 70–99)
Potassium: 3.5 mmol/L (ref 3.5–5.1)
Sodium: 137 mmol/L (ref 135–145)

## 2020-06-19 LAB — CBC
HCT: 29.1 % — ABNORMAL LOW (ref 39.0–52.0)
Hemoglobin: 9.6 g/dL — ABNORMAL LOW (ref 13.0–17.0)
MCH: 33.4 pg (ref 26.0–34.0)
MCHC: 33 g/dL (ref 30.0–36.0)
MCV: 101.4 fL — ABNORMAL HIGH (ref 80.0–100.0)
Platelets: 308 10*3/uL (ref 150–400)
RBC: 2.87 MIL/uL — ABNORMAL LOW (ref 4.22–5.81)
RDW: 17.8 % — ABNORMAL HIGH (ref 11.5–15.5)
WBC: 9.4 10*3/uL (ref 4.0–10.5)
nRBC: 0 % (ref 0.0–0.2)

## 2020-06-19 LAB — GLUCOSE, CAPILLARY
Glucose-Capillary: 144 mg/dL — ABNORMAL HIGH (ref 70–99)
Glucose-Capillary: 175 mg/dL — ABNORMAL HIGH (ref 70–99)
Glucose-Capillary: 275 mg/dL — ABNORMAL HIGH (ref 70–99)
Glucose-Capillary: 378 mg/dL — ABNORMAL HIGH (ref 70–99)

## 2020-06-19 MED ORDER — HYDROCORTISONE 1 % EX CREA
TOPICAL_CREAM | Freq: Two times a day (BID) | CUTANEOUS | Status: DC
  Filled 2020-06-19: qty 28

## 2020-06-19 MED ORDER — METHYLPREDNISOLONE SODIUM SUCC 125 MG IJ SOLR
60.0000 mg | Freq: Two times a day (BID) | INTRAMUSCULAR | Status: DC
  Administered 2020-06-19 – 2020-06-20 (×3): 60 mg via INTRAVENOUS
  Filled 2020-06-19 (×3): qty 2

## 2020-06-19 MED ORDER — SODIUM CHLORIDE 0.9 % IV SOLN: 2.0000 g | Freq: Three times a day (TID) | INTRAVENOUS | Status: DC

## 2020-06-19 MED ORDER — LORATADINE 10 MG PO TABS
10.0000 mg | ORAL_TABLET | Freq: Every day | ORAL | Status: DC
  Administered 2020-06-19 – 2020-06-20 (×2): 10 mg via ORAL
  Filled 2020-06-19 (×2): qty 1

## 2020-06-19 MED ORDER — SODIUM CHLORIDE 0.9 % IV SOLN
2.0000 g | Freq: Two times a day (BID) | INTRAVENOUS | Status: DC
  Administered 2020-06-19 – 2020-06-20 (×3): 2 g via INTRAVENOUS
  Filled 2020-06-19 (×3): qty 2

## 2020-06-19 MED ORDER — POTASSIUM CHLORIDE CRYS ER 20 MEQ PO TBCR
20.0000 meq | EXTENDED_RELEASE_TABLET | Freq: Once | ORAL | Status: AC
  Administered 2020-06-19: 20 meq via ORAL
  Filled 2020-06-19: qty 1

## 2020-06-19 NOTE — Progress Notes (Addendum)
Pharmacy Antibiotic Note  Vincent Cooper is a 79 y.o. male admitted on 06/17/2020 with cellulitis.  Pharmacy has been consulted for vancomycin and cefepime dosing.  Plan:  Continue Vancomycin  then 1250mg   IV every 24 hours.  Goal trough 10-15 mcg/mL. Cefepime 2gm IV q12h F/u clinical progress Monitor V/S, labs and levels as indicated  Height: 5\' 5"  (165.1 cm) Weight: (!) 113.8 kg (250 lb 14.1 oz) IBW/kg (Calculated) : 61.5  Temp (24hrs), Avg:98.3 F (36.8 C), Min:97.9 F (36.6 C), Max:98.6 F (37 C)  Recent Labs  Lab 06/17/20 1120 06/17/20 1133 06/18/20 0603 06/19/20 0629  WBC 9.7  --  8.7 9.4  CREATININE  --  1.50* 1.16 1.21    Estimated Creatinine Clearance: 58.6 mL/min (by C-G formula based on SCr of 1.21 mg/dL).   Normalized CrCL is 86mls/min  Allergies  Allergen Reactions  . Other     Patient reports he was allergic to something in an IV he was given but does not know what the substance was. As of 01/19/2020     Antimicrobials this admission: Vancomycin 8/1 >>   Cefepime 8/2> Ceftriaxone 8/1> 8/3  Dose adjustments this admission: prn  Microbiology results: No culture  Thank you for allowing pharmacy to be a part of this patient's care.  Isac Sarna, BS Pharm D, [place] Clinical Pharmacist Pager [phone] 06/19/2020 10:04 AM

## 2020-06-19 NOTE — Evaluation (Addendum)
Physical Therapy Evaluation Patient Details Name: Vincent Cooper MRN: [id number] DOB: [birth year] Today's Date: 06/19/2020   History of Present Illness  Vincent Cooper is a 79 y.o. male with medical history of morbid obesity, OSA, hypertension, hyperlipidemia, diabetes mellitus type 2, diastolic CHF presenting with 3-week history of leg edema associated with approximately 1 week history of leg erythema.  Patient states that in the past 2 to 3 days he has had worsening erythema.  He went to see his primary care provider on 06/15/2020.  The patient was prescribed 2 antibiotics which he took.  He stated that the redness in his legs continue to migrate proximally.  He had some mild pain.  He has noticed increasing edema over the past 2 to 3 days in his bilateral lower extremities.  He denies any fevers, chills, chest pain, nausea, vomiting, diarrhea.  He has had some worsening dyspnea on exertion.  However, the patient has been very compliant with his furosemide and cardiac medications.  He states that his weight has actually been decreasing from 260 pounds from his last visit with Dr. [name] to 246 pounds on 06/16/2020.  He states that he normally has to sleep in a recliner, but he states that that he is not able to actually lay back somewhat without any orthopnea type symptoms.  He denies any coughing, hemoptysis, recent travels.  There is no nausea, vomiting, diarrhea.  The patient states that his PCP recently decreased his furosemide from 40 mg to 20 mg daily approximately 3 days prior to this admission.In the emergency department, the patient was afebrile hemodynamically stable with oxygen saturation 95-100% room air.  Potassium was 2.2 with serum creatinine 1.50.  LFTs were unremarkable.  WBC 9.7, hemoglobin 10.2, platelets 245,000.  BMP was 87.  Chest x-ray showed chronic interstitial prominence.  The patient was given 500 cc normal saline.  Potassium chloride 40 mEq p.o. and 30 mEq IV were ordered.    Clinical  Impression  The patient was successfully able to ambulate 40 feet today using a RW without loss of balance. The patient demonstrates good return for managing the IV pole during ambulation in room and transferrering to bathroom commode. The patient reported he only uses this device on occasion at home, but received education on utilizing this more frequently for household ambulation safety. He required VC's to minimize distance between his center of mass and the walker during ambulation. The patient demonstrated SOB with supine to/from sit transfer, but was able to transfer with modified independence to the commode. Patient tolerated sitting up in chair with call bell within arm's reach at bedside after therapy. PLAN: The patient will continue to benefit from skilled physical therapy services in hospital at recommended venue below in order to improve balance, gait, and ADL's to promote independence in functional activities.      Follow Up Recommendations Home health PT    Equipment Recommendations    None recommended by PT.    Recommendations for Other Services   None recommended by PT.     Precautions / Restrictions Precautions Precautions: Fall Precaution Comments: use walker Restrictions Weight Bearing Restrictions: No      Mobility  Bed Mobility Overal bed mobility: Modified Independent                Transfers Overall transfer level: Modified independent Equipment used: Rolling walker (2 wheeled)                Ambulation/Gait Ambulation/Gait assistance: Modified independent (Device/Increase time);Supervision Gait  Distance (Feet): 40 Feet Assistive device: Rolling walker (2 wheeled);IV Pole Gait Pattern/deviations: WFL(Within Functional Limits);Step-through pattern;Trunk flexed   Gait velocity interpretation: 1.31 - 2.62 ft/sec, indicative of limited community Conservation officer, historic buildings [place] (Stroke Patients  Only)       Balance Overall balance assessment: Modified Independent                                           Pertinent Vitals/Pain Pain Assessment: No/denies pain    Home Living Family/patient expects to be discharged to:: Private residence Living Arrangements: Alone Available Help at Discharge: Family;Available PRN/intermittently Type of Home: House       Home Layout: One level Home Equipment: [place] - 2 wheels;Shower seat;Adaptive equipment;Cane - single point (sometimes uses walker)      Prior Function Level of Independence: Independent with assistive device(s)         Comments: Pt reports that his daughter will assist him with laundry and heavy household cleaning. He is able to prepare meals (he loves to cook), and he can do light cleaning.     Hand Dominance   Dominant Hand: Right    Extremity/Trunk Assessment                Communication   Communication: No difficulties  Cognition Arousal/Alertness: Awake/alert Behavior During Therapy: WFL for tasks assessed/performed Overall Cognitive Status: Within Functional Limits for tasks assessed                                        General Comments      Exercises     Assessment/Plan    PT Assessment Patient needs continued PT services  PT Problem List Decreased strength;Decreased mobility;Obesity;Cardiopulmonary status limiting activity;Decreased activity tolerance       PT Treatment Interventions Functional mobility training;Therapeutic activities;Patient/family education;Modalities;Gait training;DME instruction    PT Goals (Current goals can be found in the Care Plan section)  Acute Rehab PT Goals Patient Stated Goal: go home PT Goal Formulation: With patient Time For Goal Achievement: 06/26/20 Potential to Achieve Goals: Good    Frequency Min 2X/week   Barriers to discharge        Co-evaluation               AM-PAC PT "6 Clicks" Mobility   Outcome Measure Help needed turning from your back to your side while in a flat bed without using bedrails?: None Help needed moving from lying on your back to sitting on the side of a flat bed without using bedrails?: None Help needed moving to and from a bed to a chair (including a wheelchair)?: A Little Help needed standing up from a chair using your arms (e.g., wheelchair or bedside chair)?: None Help needed to walk in hospital room?: A Little (FWW) Help needed climbing 3-5 steps with a railing? : A Lot 6 Click Score: 20    End of Session Equipment Utilized During Treatment: Gait belt Activity Tolerance: Patient tolerated treatment well Patient left: in chair;with call bell/phone within reach Nurse Communication: Mobility status PT Visit Diagnosis: Muscle weakness (generalized) (M62.81);Unsteadiness on feet (R26.81);Other abnormalities of gait and mobility (R26.89)    Time: 4680-3212 PT Time Calculation (  min) (ACUTE ONLY): 16 min   Charges:   PT Evaluation $PT Eval Low Complexity: 1 Low PT Treatments $Therapeutic Activity: 8-22 mins        4:02 PM , 06/19/20 Karlyn Agee, SPT Physical Therapy with [place]  [facility] [place] [phone] office  During this treatment session, the therapist was present, participating in and directing the treatment.  4:02 PM, 06/19/20 Lonell Grandchild, MPT Physical Therapist with [facility] [phone] office [phone] mobile phone

## 2020-06-19 NOTE — Progress Notes (Addendum)
PROGRESS NOTE  Vincent Cooper IOX:[id number] DOB: 12/31/1940 DOA: 06/17/2020 PCP: [name], DO  Brief History:  79 y.o.malewith medical history ofmorbid obesity, OSA, hypertension, hyperlipidemia, diabetes mellitus type 2, diastolic CHF presenting with 3-week history of leg edema associated with approximately 1 week history of leg erythema. Patient states that in the past 2 to 3 days he has had worsening erythema. He went to see his primary care provider on 06/15/2020. The patient was prescribed 2 antibiotics which he took. He stated that the redness in his legs continue to migrate proximally. He had some mild pain. He has noticed increasing edema over the past 2 to 3 days in his bilateral lower extremities. He denies any fevers, chills, chest pain, nausea, vomiting, diarrhea.  the patient has been very compliant with his furosemide and cardiac medications. He states that his weight has actually been decreasing from 260 pounds from his last visit with Dr. [name] to 246 pounds on 06/16/2020. He states that he normally has to sleep in a recliner, but he states that that he is not able to actually lay back somewhat without any orthopnea type symptoms. He denies any coughing, hemoptysis, recent travels. There is no nausea, vomiting, diarrhea. The patient states that his PCP recently decreased his furosemide from 40 mg to 20 mg daily approximately 3 days prior to this admission. In the emergency department, the patient was afebrile hemodynamically stable with oxygen saturation 95-100% room air. Potassium was 2.2 with serum creatinine 1.50. LFTs were unremarkable. WBC 9.7, hemoglobin 10.2, platelets 245,000. BMP was 87. Chest x-ray showed chronic interstitial prominence. The patient was given 500 cc normal saline. Potassium chloride 40 mEq p.o. and 30 mEq IV were ordered.  Patient was started initially on cefazolin.  Assessment/Plan: Cellulitis--legs -Failed outpatient oral  antibiotics -d/c cefazolin -legs have developed small pustules -continue vanco -add cefepime -Venous duplex lower extremities--neg  COPD exacerbation -continue duo nebs -continue Pulmicort -start IV steroids  Chronic diastolic CHF -restarted furosemide -daily weights  PE/DVT -Diagnosed 11/07/2019 -Continue apixaban  AKI -Holding furosemide temporarily -A.m. BMP -Baseline creatinine 0.7-1.0 -Presented with serum creatinine 1.50 -Holding lisinopril  Hyperlipidemia -Continue statin  Essential hypertension -Continue diltiazem -holding lisinopril  Hypokalemia -Replete -Check magnesium--2.0  Diabetes mellitus type 2 -06/17/2020 hemoglobin A1c 5.4 -Holding metformin -NovoLog sliding scale  Nonocclusive CAD -No chest pain presently -EKG without concerning ischemic changes  Morbid obesity -Lifestyle modification -BMI 41.75  OSA -Patient is poorly compliant with CPAP at home         Status is: Inpatient  Remains inpatient appropriate because:IV treatments appropriate due to intensity of illness or inability to take PO  No appreciable improvement in cellulitis   Dispo: The patient is from: Home  Anticipated d/c is to: Home  Anticipated d/c date is: 2 days  Patient currently is not medically stable to d/c.        Family Communication: tried to call daughter;  VM is full, could not leave VM  Consultants:  none  Code Status:  FULL  DVT Prophylaxis: apixaban   Procedures: As Listed in Progress Note Above  Antibiotics: Cefazolin 7/31>>>8/1 vanco 8/1>>     Subjective: Patient has intermittent sob.  Denies cp, n/v/d.  Feels that leg edema is slowly improving and pain is improved  Objective: Vitals:   06/19/20 0449 06/19/20 0751 06/19/20 0756 06/19/20 0800  BP: (!) 114/53   (!) 124/53  Pulse: 84   89  Resp: 20  20  Temp: 97.9 F (36.6 C)     TempSrc: Oral      SpO2: 91% 96% 96% 97%  Weight: (!) 113.8 kg     Height:        Intake/Output Summary (Last 24 hours) at 06/19/2020 0947 Last data filed at 06/18/2020 1625 Gross per 24 hour  Intake 475.16 ml  Output --  Net 475.16 ml   Weight change: -4.135 kg Exam:   General:  Pt is alert, follows commands appropriately, not in acute distress  HEENT: No icterus, No thrush, No neck mass, [place]/AT  Cardiovascular: RRR, S1/S2, no rubs, no gallops  Respiratory:bibasilar crackles. Bilateral wheeze  Abdomen: Soft/+BS, non tender, non distended, no guarding  Extremities: 1+LE edema, No lymphangitis, No petechiae, No rashes, no synovitis       Data Reviewed: I have personally reviewed following labs and imaging studies Basic Metabolic Panel: Recent Labs  Lab 06/17/20 1133 06/18/20 0603 06/19/20 0629  NA 136 137 137  K 2.2* 2.7* 3.5  CL 90* 96* 101  CO2 32 28 28  GLUCOSE 204* 105* 145*  BUN 19 16 14   CREATININE 1.50* 1.16 1.21  CALCIUM 8.4* 8.5* 8.0*  MG  --  2.0  --    Liver Function Tests: Recent Labs  Lab 06/17/20 1133  AST 26  ALT 30  ALKPHOS 57  BILITOT 1.0  PROT 6.0*  ALBUMIN 3.0*   No results for input(s): LIPASE, AMYLASE in the last 168 hours. No results for input(s): AMMONIA in the last 168 hours. Coagulation Profile: No results for input(s): INR, PROTIME in the last 168 hours. CBC: Recent Labs  Lab 06/17/20 1120 06/18/20 0603 06/19/20 0629  WBC 9.7 8.7 9.4  HGB 10.2* 10.1* 9.6*  HCT 30.2* 30.8* 29.1*  MCV 97.1 100.0 101.4*  PLT 345 353 308   Cardiac Enzymes: No results for input(s): CKTOTAL, CKMB, CKMBINDEX, TROPONINI in the last 168 hours. BNP: Invalid input(s): POCBNP CBG: Recent Labs  Lab 06/18/20 0828 06/18/20 1218 06/18/20 1707 06/18/20 2122 06/19/20 0806  GLUCAP 108* 211* 150* 209* 144*   HbA1C: Recent Labs    06/17/20 1120  HGBA1C 5.4   Urine analysis:    Component Value Date/Time   COLORURINE YELLOW 06/17/2020 1107    APPEARANCEUR CLEAR 06/17/2020 1107   LABSPEC 1.010 06/17/2020 1107   PHURINE 5.0 06/17/2020 1107   GLUCOSEU 50 (A) 06/17/2020 1107   HGBUR SMALL (A) 06/17/2020 1107   BILIRUBINUR NEGATIVE 06/17/2020 1107   KETONESUR NEGATIVE 06/17/2020 1107   PROTEINUR 30 (A) 06/17/2020 1107   NITRITE NEGATIVE 06/17/2020 1107   LEUKOCYTESUR NEGATIVE 06/17/2020 1107   Sepsis Labs: @LABRCNTIP (procalcitonin:4,lacticidven:4) ) Recent Results (from the past 240 hour(s))  SARS Coronavirus 2 by RT PCR (hospital order, performed in [place] hospital lab) Nasopharyngeal Nasopharyngeal Swab     Status: None   Collection Time: 06/17/20  1:15 PM   Specimen: Nasopharyngeal Swab  Result Value Ref Range Status   SARS Coronavirus 2 NEGATIVE NEGATIVE Final    Comment: (NOTE) SARS-CoV-2 target nucleic acids are NOT DETECTED.  The SARS-CoV-2 RNA is generally detectable in upper and lower respiratory specimens during the acute phase of infection. The lowest concentration of SARS-CoV-2 viral copies this assay can detect is 250 copies / mL. A negative result does not preclude SARS-CoV-2 infection and should not be used as the sole basis for treatment or other patient management decisions.  A negative result may occur with improper specimen collection / handling, submission of specimen  other than nasopharyngeal swab, presence of viral mutation(s) within the areas targeted by this assay, and inadequate number of viral copies (<250 copies / mL). A negative result must be combined with clinical observations, patient history, and epidemiological information.  Fact Sheet for Patients:   StrictlyIdeas.no  Fact Sheet for Healthcare Providers: BankingDealers.co.za  This test is not yet approved or  cleared by the [place] FDA and has been authorized for detection and/or diagnosis of SARS-CoV-2 by FDA under an Emergency Use Authorization (EUA).  This EUA will  remain in effect (meaning this test can be used) for the duration of the COVID-19 declaration under Section 564(b)(1) of the Act, 21 U.S.C. section 360bbb-3(b)(1), unless the authorization is terminated or revoked sooner.  Performed at [facility], [address], [place], [place] [address]      Scheduled Meds: . apixaban  5 mg Oral BID  . budesonide (PULMICORT) nebulizer solution  0.5 mg Nebulization BID  . diltiazem  360 mg Oral QHS  . furosemide  40 mg Oral Daily  . insulin aspart  0-5 Units Subcutaneous QHS  . insulin aspart  0-9 Units Subcutaneous TID WC  . ipratropium-albuterol  3 mL Nebulization Q6H WA  . polyethylene glycol  17 g Oral Daily  . rosuvastatin  40 mg Oral Daily   Continuous Infusions: . sodium chloride 1,000 mL (06/17/20 1745)  . vancomycin      Procedures/Studies: US Venous Img Lower Bilateral  Result Date: 06/17/2020 CLINICAL DATA:  Edema EXAM: BILATERAL LOWER EXTREMITY VENOUS DOPPLER ULTRASOUND TECHNIQUE: Gray-scale sonography with compression, as well as color and duplex ultrasound, were performed to evaluate the deep venous system(s) from the level of the common femoral vein through the popliteal and proximal calf veins. COMPARISON:  None. FINDINGS: VENOUS Normal compressibility of the common femoral, superficial femoral, and popliteal veins, as well as the visualized calf veins. Visualized portions of profunda femoral vein and great saphenous vein unremarkable. No filling defects to suggest DVT on grayscale or color Doppler imaging. Doppler waveforms show normal direction of venous flow, normal respiratory plasticity and response to augmentation. Limited views of the contralateral common femoral vein are unremarkable. OTHER Ill-defined fluid/edema within the subcutaneous soft tissues of both calves. Limitations: none IMPRESSION: 1. No DVT seen, bilateral lower extremities. 2. Fluid/edema within the subcutaneous soft tissues of both calves. Electronically Signed    By: Franki Cabot M.D.   On: 06/17/2020 14:10   DG Chest Port 1 View  Result Date: 06/17/2020 CLINICAL DATA:  Bilateral foot swelling EXAM: PORTABLE CHEST 1 VIEW COMPARISON:  03/17/2020 FINDINGS: Stable mild cardiomegaly. Atherosclerotic calcification of the aortic knob. Mildly hyperexpanded lungs coarsened bibasilar interstitial markings. No focal airspace consolidation. No overt edema. No significant pleural fluid collection. No pneumothorax. IMPRESSION: Stable mild cardiomegaly. No overt edema or focal airspace consolidation. Electronically Signed   By: Davina Poke D.O.   On: 06/17/2020 12:30    Orson Eva, DO  [facility]  If 7PM-7AM, please contact night-coverage [email or web address] Password [email or web address] 06/19/2020, 9:47 AM   LOS: 2 days

## 2020-06-19 NOTE — Plan of Care (Addendum)
  Problem: Acute Rehab PT Goals(only PT should resolve) Goal: Pt Will Go Supine/Side To Sit Outcome: Progressing Flowsheets (Taken 06/19/2020 1556) Pt will go Supine/Side to Sit: Independently Goal: Patient Will Transfer Sit To/From Stand Outcome: Progressing Flowsheets (Taken 06/19/2020 1556) Patient will transfer sit to/from stand: Independently Goal: Pt Will Transfer Bed To Chair/Chair To Bed Outcome: Progressing Flowsheets (Taken 06/19/2020 1556) Pt will Transfer Bed to Chair/Chair to Bed: with modified independence Goal: Pt Will Ambulate Outcome: Progressing Flowsheets (Taken 06/19/2020 1556) Pt will Ambulate: . 75 feet . with modified independence . with supervision   3:57 PM , 06/19/20 Karlyn Agee, SPT Physical Therapy with [facility] [phone] office  During this treatment session, the therapist was present, participating in and directing the treatment.  4:06 PM, 06/19/20 Lonell Grandchild, MPT Physical Therapist with [facility] [phone] office [phone] mobile phone

## 2020-06-20 LAB — BASIC METABOLIC PANEL
Anion gap: 10 (ref 5–15)
BUN: 21 mg/dL (ref 8–23)
CO2: 23 mmol/L (ref 22–32)
Calcium: 8.1 mg/dL — ABNORMAL LOW (ref 8.9–10.3)
Chloride: 99 mmol/L (ref 98–111)
Creatinine, Ser: 1.08 mg/dL (ref 0.61–1.24)
GFR calc Af Amer: >60 mL/min (ref >60)
GFR calc non Af Amer: >60 mL/min (ref >60)
Glucose, Bld: 275 mg/dL — ABNORMAL HIGH (ref 70–99)
Potassium: 4 mmol/L (ref 3.5–5.1)
Sodium: 132 mmol/L — ABNORMAL LOW (ref 135–145)

## 2020-06-20 LAB — GLUCOSE, CAPILLARY
Glucose-Capillary: 267 mg/dL — ABNORMAL HIGH (ref 70–99)
Glucose-Capillary: 267 mg/dL — ABNORMAL HIGH (ref 70–99)
Glucose-Capillary: 316 mg/dL — ABNORMAL HIGH (ref 70–99)

## 2020-06-20 MED ORDER — PREDNISONE 20 MG PO TABS: 60.0000 mg | ORAL_TABLET | Freq: Every day | ORAL | Status: DC

## 2020-06-20 MED ORDER — PREDNISONE 10 MG PO TABS: 60.0000 mg | ORAL_TABLET | Freq: Every day | ORAL | 0 refills | Status: DC

## 2020-06-20 MED ORDER — INSULIN ASPART 100 UNIT/ML ~~LOC~~ SOLN: 0.0000 [IU] | Freq: Every day | SUBCUTANEOUS | Status: DC

## 2020-06-20 MED ORDER — CEFDINIR 300 MG PO CAPS: 300.0000 mg | ORAL_CAPSULE | Freq: Two times a day (BID) | ORAL | 0 refills | Status: DC

## 2020-06-20 MED ORDER — INSULIN ASPART 100 UNIT/ML ~~LOC~~ SOLN
0.0000 [IU] | Freq: Three times a day (TID) | SUBCUTANEOUS | Status: DC
  Administered 2020-06-20: 8 [IU] via SUBCUTANEOUS
  Administered 2020-06-20: 11 [IU] via SUBCUTANEOUS

## 2020-06-20 MED ORDER — CEFDINIR 300 MG PO CAPS
300.0000 mg | ORAL_CAPSULE | Freq: Two times a day (BID) | ORAL | Status: DC
  Administered 2020-06-20: 300 mg via ORAL
  Filled 2020-06-20: qty 1

## 2020-06-20 MED ORDER — DOXYCYCLINE HYCLATE 100 MG PO TABS
100.0000 mg | ORAL_TABLET | Freq: Two times a day (BID) | ORAL | Status: DC
  Administered 2020-06-20: 100 mg via ORAL
  Filled 2020-06-20: qty 1

## 2020-06-20 NOTE — Discharge Summary (Signed)
Physician Discharge Summary  Vincent Cooper TFT:[id number] DOB: 11-21-1940 DOA: 06/17/2020  PCP: Vincent Heinrich, DO  Admit date: 06/17/2020 Discharge date: 06/20/2020  Admitted From: Home Disposition:  Home   Recommendations for Outpatient Follow-up:  1. Follow up with PCP in 1-2 weeks 2. Please obtain BMP/CBC in one week   Home Health: HHPT   Discharge Condition: Stable CODE STATUS: FULL Diet recommendation: Heart Healthy / Carb Modified    Brief/Interim Summary: 79 y.o.malewith medical history ofmorbid obesity, OSA, hypertension, hyperlipidemia, diabetes mellitus type 2, diastolic CHF presenting with 3-week history of leg edema associated with approximately 1 week history of leg erythema. Patient states that in the past 2 to 3 days he has had worsening erythema. He went to see his primary care provider on 06/15/2020. The patient was prescribed 2 antibiotics which he took. He stated that the redness in his legs continue to migrate proximally. He had some mild pain. He has noticed increasing edema over the past 2 to 3 days in his bilateral lower extremities. He denies any fevers, chills, chest pain, nausea, vomiting, diarrhea.  the patient has been very compliant with his furosemide and cardiac medications. He states that his weight has actually been decreasing from 260 pounds from his last visit with Dr. Ellyn Cooper to 246 pounds on 06/16/2020. He states that he normally has to sleep in a recliner, but he states that that he is not able to actually lay back somewhat without any orthopnea type symptoms. He denies any coughing, hemoptysis, recent travels. There is no nausea, vomiting, diarrhea. The patient states that his PCP recently decreased his furosemide from 40 mg to 20 mg daily approximately 3 days prior to this admission. In the emergency department, the patient was afebrile hemodynamically stable with oxygen saturation 95-100% room air. Potassium was 2.2 with serum creatinine  1.50. LFTs were unremarkable. WBC 9.7, hemoglobin 10.2, platelets 245,000. BMP was 87. Chest x-ray showed chronic interstitial prominence. The patient was given 500 cc normal saline. Potassium chloride 40 mEq p.o. and 30 mEq IV were ordered.Patient was started initially on cefazolin.  Improvement was slow despite broadening abx to vanc and cefepime.  Solumedrol was added for COPD exacerbation, but resulted in significant improvement of his lower extremity erythema and edema suggesting a component of hypersensitivity/atopic type dermatitis superimposed upon cellulitis. He will go home with prednisone cefdinir and doxy x 7 more days  Discharge Diagnoses:  Cellulitis--legs -Failed outpatient oral antibiotics -d/ccefazolin -legs have developed small pustules -continue vanco -added cefepime -Venous duplex lower extremities--neg -suspect component of hypersensitivity/atopic dermatitis superimposed upon his cellultis -d/c home with doxy/cedinir and prednisone  COPD exacerbation -continueduo nebs -continuePulmicort -start IV steroids>>home with prednisone taper  Chronic diastolic CHF -restartedfurosemide -daily weights -clinically euvolemic  PE/DVT -Diagnosed 11/07/2019 -Continue apixaban  AKI -Holding furosemide temporarily -A.m. BMP -Baseline creatinine 0.7-1.0 -Presented with serum creatinine 1.50 -Holding lisinopril>>resume after d/c -serum creatinine 1.08 on day of d/c  Hyperlipidemia -Continue statin  Essential hypertension -Continue diltiazem -holding lisinopril  Hypokalemia -Replete -Check magnesium--2.0  Diabetes mellitus type 2 -06/17/2020 hemoglobin A1c 5.4 -Holding metformin -NovoLog sliding scale  Nonocclusive CAD -No chest pain presently -EKG without concerning ischemic changes  Morbid obesity -Lifestyle modification -BMI 41.75  OSA -Patient is poorly compliant with CPAP at home   Discharge Instructions   Allergies as of  06/20/2020      Reactions   Other    Patient reports he was allergic to something in an IV he was given but does not know what  the substance was. As of 01/19/2020       Medication List    STOP taking these medications   allopurinol 300 MG tablet Commonly known as: ZYLOPRIM   budesonide-formoterol 160-4.5 MCG/ACT inhaler Commonly known as: SYMBICORT   cephALEXin 500 MG capsule Commonly known as: KEFLEX   furosemide 40 MG tablet Commonly known as: LASIX     TAKE these medications   albuterol 108 (90 Base) MCG/ACT inhaler Commonly known as: VENTOLIN HFA Inhale 1 puff into the lungs every 6 (six) hours as needed for wheezing or shortness of breath.   apixaban 5 MG Tabs tablet Commonly known as: ELIQUIS Take 1 tablet (5 mg total) by mouth 2 (two) times daily.   cefdinir 300 MG capsule Commonly known as: OMNICEF Take 1 capsule (300 mg total) by mouth every 12 (twelve) hours.   diltiazem 180 MG 24 hr capsule Commonly known as: Cardizem CD Take 2 capsules (360 mg total) by mouth daily. What changed: when to take this   doxycycline 100 MG capsule Commonly known as: VIBRAMYCIN Take 100 mg by mouth 2 (two) times daily. For 10 days   glimepiride 2 MG tablet Commonly known as: AMARYL Take 2 mg by mouth daily with breakfast.   guaiFENesin 600 MG 12 hr tablet Commonly known as: MUCINEX Take 1 tablet (600 mg total) by mouth 2 (two) times daily.   lisinopril 40 MG tablet Commonly known as: ZESTRIL Take 0.5 tablets (20 mg total) by mouth daily. What changed: when to take this   metFORMIN 500 MG tablet Commonly known as: GLUCOPHAGE Take 500 mg by mouth 2 (two) times daily with a meal.   nitroGLYCERIN 0.4 MG SL tablet Commonly known as: NITROSTAT Place 1 tablet (0.4 mg total) under the tongue every 5 (five) minutes as needed for chest pain.   predniSONE 10 MG tablet Commonly known as: DELTASONE Take 6 tablets (60 mg total) by mouth daily with breakfast. And decrease by one  tablet daily Start taking on: June 21, 2020 What changed:   medication strength  when to take this  additional instructions   rosuvastatin 40 MG tablet Commonly known as: CRESTOR Take 1 tablet (40 mg total) by mouth daily.   torsemide 20 MG tablet Commonly known as: DEMADEX Take 20 mg by mouth 2 (two) times daily.   Trelegy Ellipta 100-62.5-25 MCG/INH Aepb Generic drug: Fluticasone-Umeclidin-Vilant Inhale 1 puff into the lungs daily.   triamcinolone cream 0.1 % Commonly known as: KENALOG Apply 1 application topically 2 (two) times daily.       Allergies  Allergen Reactions  . Other     Patient reports he was allergic to something in an IV he was given but does not know what the substance was. As of 01/19/2020     Consultations:  none   Procedures/Studies: US Venous Img Lower Bilateral  Result Date: 06/17/2020 CLINICAL DATA:  Edema EXAM: BILATERAL LOWER EXTREMITY VENOUS DOPPLER ULTRASOUND TECHNIQUE: Gray-scale sonography with compression, as well as color and duplex ultrasound, were performed to evaluate the deep venous system(s) from the level of the common femoral vein through the popliteal and proximal calf veins. COMPARISON:  None. FINDINGS: VENOUS Normal compressibility of the common femoral, superficial femoral, and popliteal veins, as well as the visualized calf veins. Visualized portions of profunda femoral vein and great saphenous vein unremarkable. No filling defects to suggest DVT on grayscale or color Doppler imaging. Doppler waveforms show normal direction of venous flow, normal respiratory plasticity and response to augmentation.  Limited views of the contralateral common femoral vein are unremarkable. OTHER Ill-defined fluid/edema within the subcutaneous soft tissues of both calves. Limitations: none IMPRESSION: 1. No DVT seen, bilateral lower extremities. 2. Fluid/edema within the subcutaneous soft tissues of both calves. Electronically Signed   By: Franki Cabot M.D.   On: 06/17/2020 14:10   DG Chest Port 1 View  Result Date: 06/17/2020 CLINICAL DATA:  Bilateral foot swelling EXAM: PORTABLE CHEST 1 VIEW COMPARISON:  03/17/2020 FINDINGS: Stable mild cardiomegaly. Atherosclerotic calcification of the aortic knob. Mildly hyperexpanded lungs coarsened bibasilar interstitial markings. No focal airspace consolidation. No overt edema. No significant pleural fluid collection. No pneumothorax. IMPRESSION: Stable mild cardiomegaly. No overt edema or focal airspace consolidation. Electronically Signed   By: Davina Poke D.O.   On: 06/17/2020 12:30         Discharge Exam: Vitals:   06/20/20 0902 06/20/20 0907  BP:    Pulse:    Resp:    Temp:    SpO2: 99% 99%   Vitals:   06/20/20 0511 06/20/20 0600 06/20/20 0902 06/20/20 0907  BP: (!) 126/54     Pulse: 84     Resp: 20     Temp: 98.8 F (37.1 C)     TempSrc: Oral     SpO2: 99%  99% 99%  Weight:  115 kg    Height:        General: Pt is alert, awake, not in acute distress Cardiovascular: RRR, S1/S2 +, no rubs, no gallops Respiratory: bibasilar crackles. No wheeze Abdominal: Soft, NT, ND, bowel sounds + Extremities: 1+ edema, no cyanosis   The results of significant diagnostics from this hospitalization (including imaging, microbiology, ancillary and laboratory) are listed below for reference.    Significant Diagnostic Studies: US Venous Img Lower Bilateral  Result Date: 06/17/2020 CLINICAL DATA:  Edema EXAM: BILATERAL LOWER EXTREMITY VENOUS DOPPLER ULTRASOUND TECHNIQUE: Gray-scale sonography with compression, as well as color and duplex ultrasound, were performed to evaluate the deep venous system(s) from the level of the common femoral vein through the popliteal and proximal calf veins. COMPARISON:  None. FINDINGS: VENOUS Normal compressibility of the common femoral, superficial femoral, and popliteal veins, as well as the visualized calf veins. Visualized portions of profunda  femoral vein and great saphenous vein unremarkable. No filling defects to suggest DVT on grayscale or color Doppler imaging. Doppler waveforms show normal direction of venous flow, normal respiratory plasticity and response to augmentation. Limited views of the contralateral common femoral vein are unremarkable. OTHER Ill-defined fluid/edema within the subcutaneous soft tissues of both calves. Limitations: none IMPRESSION: 1. No DVT seen, bilateral lower extremities. 2. Fluid/edema within the subcutaneous soft tissues of both calves. Electronically Signed   By: Franki Cabot M.D.   On: 06/17/2020 14:10   DG Chest Port 1 View  Result Date: 06/17/2020 CLINICAL DATA:  Bilateral foot swelling EXAM: PORTABLE CHEST 1 VIEW COMPARISON:  03/17/2020 FINDINGS: Stable mild cardiomegaly. Atherosclerotic calcification of the aortic knob. Mildly hyperexpanded lungs coarsened bibasilar interstitial markings. No focal airspace consolidation. No overt edema. No significant pleural fluid collection. No pneumothorax. IMPRESSION: Stable mild cardiomegaly. No overt edema or focal airspace consolidation. Electronically Signed   By: Davina Poke D.O.   On: 06/17/2020 12:30     Microbiology: Recent Results (from the past 240 hour(s))  SARS Coronavirus 2 by RT PCR (hospital order, performed in [facility] hospital lab) Nasopharyngeal Nasopharyngeal Swab     Status: None   Collection Time: 06/17/20  1:15  PM   Specimen: Nasopharyngeal Swab  Result Value Ref Range Status   SARS Coronavirus 2 NEGATIVE NEGATIVE Final    Comment: (NOTE) SARS-CoV-2 target nucleic acids are NOT DETECTED.  The SARS-CoV-2 RNA is generally detectable in upper and lower respiratory specimens during the acute phase of infection. The lowest concentration of SARS-CoV-2 viral copies this assay can detect is 250 copies / mL. A negative result does not preclude SARS-CoV-2 infection and should not be used as the sole basis for treatment or  other patient management decisions.  A negative result may occur with improper specimen collection / handling, submission of specimen other than nasopharyngeal swab, presence of viral mutation(s) within the areas targeted by this assay, and inadequate number of viral copies (<250 copies / mL). A negative result must be combined with clinical observations, patient history, and epidemiological information.  Fact Sheet for Patients:   StrictlyIdeas.no  Fact Sheet for Healthcare Providers: BankingDealers.co.za  This test is not yet approved or  cleared by the [place] FDA and has been authorized for detection and/or diagnosis of SARS-CoV-2 by FDA under an Emergency Use Authorization (EUA).  This EUA will remain in effect (meaning this test can be used) for the duration of the COVID-19 declaration under Section 564(b)(1) of the Act, 21 U.S.C. section 360bbb-3(b)(1), unless the authorization is terminated or revoked sooner.  Performed at [facility], [address] [place], [place] [address]      Labs: Basic Metabolic Panel: Recent Labs  Lab 06/17/20 1133 06/17/20 1133 06/18/20 0603 06/18/20 0603 06/19/20 0629 06/20/20 0508  NA 136  --  137  --  137 132*  K 2.2*   < > 2.7*   < > 3.5 4.0  CL 90*  --  96*  --  101 99  CO2 32  --  28  --  28 23  GLUCOSE 204*  --  105*  --  145* 275*  BUN 19  --  16  --  14 21  CREATININE 1.50*  --  1.16  --  1.21 1.08  CALCIUM 8.4*  --  8.5*  --  8.0* 8.1*  MG  --   --  2.0  --   --   --    < > = values in this interval not displayed.   Liver Function Tests: Recent Labs  Lab 06/17/20 1133  AST 26  ALT 30  ALKPHOS 57  BILITOT 1.0  PROT 6.0*  ALBUMIN 3.0*   No results for input(s): LIPASE, AMYLASE in the last 168 hours. No results for input(s): AMMONIA in the last 168 hours. CBC: Recent Labs  Lab 06/17/20 1120 06/18/20 0603 06/19/20 0629  WBC 9.7 8.7 9.4  HGB 10.2* 10.1* 9.6*   HCT 30.2* 30.8* 29.1*  MCV 97.1 100.0 101.4*  PLT 345 353 308   Cardiac Enzymes: No results for input(s): CKTOTAL, CKMB, CKMBINDEX, TROPONINI in the last 168 hours. BNP: Invalid input(s): POCBNP CBG: Recent Labs  Lab 06/19/20 1136 06/19/20 1705 06/19/20 2034 06/20/20 0739 06/20/20 1131  GLUCAP 175* 275* 378* 267*  267* 316*    Time coordinating discharge:  36 minutes  Signed:  Orson Eva, DO [facility] Pager: [phone] 06/20/2020, 12:52 PM

## 2020-06-20 NOTE — TOC Transition Note (Signed)
Transition of Care [facility]) - CM/SW Discharge Note   Patient Details  Name: Vincent Cooper MRN: [id number] Date of Birth: [birth year]  [facility] CM/SW Contact:  Boneta Lucks, RN Phone Number: 06/20/2020, 1:40 PM   Clinical Narrative:  Patient admitted with Cellulitis. MD order HHPT. Patient has [employer]. Tim with [facility] accepted the referral.      Final next level of care: [place] Barriers to Discharge: Barriers Resolved   Patient Goals and CMS Choice Patient states their goals for this hospitalization and ongoing recovery are:: to go home. CMS Medicare.gov Compare Post Acute Care list provided to:: Patient    Discharge Placement         Discharge Plan and Services        HH Arranged: PT [place] Agency: [facility] (now [facility]) Date [place]: 06/20/20 Time [place]: 1247 Representative spoke with at [place]: Tim Justis

## 2020-06-20 NOTE — Progress Notes (Signed)
Inpatient Diabetes Program Recommendations  AACE/ADA: New Consensus Statement on Inpatient Glycemic Control (2015)  Target Ranges:  Prepandial:   less than 140 mg/dL      Peak postprandial:   less than 180 mg/dL (1-2 hours)      Critically ill patients:  140 - 180 mg/dL   Lab Results  Component Value Date   GLUCAP 267 (H) 06/20/2020   GLUCAP 267 (H) 06/20/2020   HGBA1C 5.4 06/17/2020    Review of Glycemic Control Results for Vincent Cooper, Vincent Cooper (MRN [id number]) as of 06/20/2020 10:58  Ref. Range 06/19/2020 17:05 06/19/2020 20:34 06/20/2020 07:39 06/20/2020 07:39  Glucose-Capillary Latest Ref Range: 70 - 99 mg/dL 275 (H) 378 (H) 267 (H) 267 (H)   Diabetes history: Type 2 DM Outpatient Diabetes medications: Amaryl 2 mg QD, Metformin 500 mg BID Current orders for Inpatient glycemic control: Novolog 0-15 units TID, Novolog 0-5 units QHS Solumedrol 60 mg BID  Inpatient Diabetes Program Recommendations:    In the setting of steroids and if to remain inpatient consider adding Novolog 5 units TID (assuming patient is consuming >50% of meal).  Thanks, Bronson Curb, MSN, RNC-OB Diabetes Coordinator [phone] (8a-5p)

## 2020-06-20 NOTE — Progress Notes (Signed)
Physical Therapy Treatment Patient Details Name: Vincent Cooper MRN: [id number] DOB: [birth year] Today's Date: 06/20/2020    History of Present Illness Vincent Cooper is a 79 y.o. male with medical history of morbid obesity, OSA, hypertension, hyperlipidemia, diabetes mellitus type 2, diastolic CHF presenting with 3-week history of leg edema associated with approximately 1 week history of leg erythema.  Patient states that in the past 2 to 3 days he has had worsening erythema.  He went to see his primary care provider on 06/15/2020.  The patient was prescribed 2 antibiotics which he took.  He stated that the redness in his legs continue to migrate proximally.  He had some mild pain.  He has noticed increasing edema over the past 2 to 3 days in his bilateral lower extremities.  He denies any fevers, chills, chest pain, nausea, vomiting, diarrhea.  He has had some worsening dyspnea on exertion.  However, the patient has been very compliant with his furosemide and cardiac medications.  He states that his weight has actually been decreasing from 260 pounds from his last visit with Dr. [name] to 246 pounds on 06/16/2020.  He states that he normally has to sleep in a recliner, but he states that that he is not able to actually lay back somewhat without any orthopnea type symptoms.  He denies any coughing, hemoptysis, recent travels.  There is no nausea, vomiting, diarrhea.  The patient states that his PCP recently decreased his furosemide from 40 mg to 20 mg daily approximately 3 days prior to this admission.In the emergency department, the patient was afebrile hemodynamically stable with oxygen saturation 95-100% room air.  Potassium was 2.2 with serum creatinine 1.50.  LFTs were unremarkable.  WBC 9.7, hemoglobin 10.2, platelets 245,000.  BMP was 87.  Chest x-ray showed chronic interstitial prominence.  The patient was given 500 cc normal saline.  Potassium chloride 40 mEq p.o. and 30 mEq IV were ordered.    PT Comments     Pt tolerates increased ambulation distance without increased pain this session; cued for upright posture but unable to improve so possibly baseline posture.  Pt tolerates therapeutic exercises without increased pain in BLE. Good steadiness while ambulating with RW, educated pt on ambulating with family or nursing when available later today and to continue seated BLE strengthening exercises to maintain strength. Patient will benefit from continued physical therapy in hospital and recommendations below to increase strength, balance, endurance for safe ADLs and gait.     Follow Up Recommendations  Home health PT     Equipment Recommendations  None recommended by PT    Recommendations for Other Services       Precautions / Restrictions Precautions Precautions: None Restrictions Weight Bearing Restrictions: No    Mobility  Bed Mobility  General bed mobility comments: in chair upon arrival  Transfers Overall transfer level: Modified independent Equipment used: Rolling walker (2 wheeled)  General transfer comment: good steadiness upon rising, uses BUE to power up from seated surface  Ambulation/Gait Ambulation/Gait assistance: Modified independent (Device/Increase time) Gait Distance (Feet): 100 Feet Assistive device: Rolling walker (2 wheeled) Gait Pattern/deviations: WFL(Within Functional Limits);Step-through pattern;Trunk flexed Gait velocity: slightly decreased   General Gait Details: ambulates in hallway with RW for steadying, trunk slightly flexed forward onto RW despite cues for upright posture, no unsteadiness or LOB, mild SOB but able to continue conversation   Stairs             Wheelchair Mobility    Modified Rankin (Stroke Patients  Only)       Balance Overall balance assessment: Modified Independent         Cognition Arousal/Alertness: Awake/alert Behavior During Therapy: WFL for tasks assessed/performed Overall Cognitive Status: Within Functional  Limits for tasks assessed         Exercises General Exercises - Lower Extremity Long Arc Quad: Strengthening;Seated;Both;15 reps Hip Flexion/Marching: Strengthening;Seated;Both;15 reps Toe Raises: Strengthening;Seated;Both;15 reps Heel Raises: Strengthening;Seated;Both;15 reps    General Comments        Pertinent Vitals/Pain Pain Assessment: No/denies pain    Home Living                      Prior Function            PT Goals (current goals can now be found in the care plan section) Acute Rehab PT Goals Patient Stated Goal: go home PT Goal Formulation: With patient Time For Goal Achievement: 06/26/20 Potential to Achieve Goals: Good Progress towards PT goals: Progressing toward goals    Frequency    Min 2X/week      PT Plan Current plan remains appropriate    Co-evaluation              AM-PAC PT "6 Clicks" Mobility   Outcome Measure  Help needed turning from your back to your side while in a flat bed without using bedrails?: None Help needed moving from lying on your back to sitting on the side of a flat bed without using bedrails?: None Help needed moving to and from a bed to a chair (including a wheelchair)?: None Help needed standing up from a chair using your arms (e.g., wheelchair or bedside chair)?: None Help needed to walk in hospital room?: None Help needed climbing 3-5 steps with a railing? : A Little 6 Click Score: 23    End of Session   Activity Tolerance: Patient tolerated treatment well Patient left: in chair;with call bell/phone within reach Nurse Communication: Mobility status PT Visit Diagnosis: Muscle weakness (generalized) (M62.81);Unsteadiness on feet (R26.81);Other abnormalities of gait and mobility (R26.89)     Time: 3202-3343 PT Time Calculation (min) (ACUTE ONLY): 13 min  Charges:  $Therapeutic Exercise: 8-22 mins                      Tori Tashonna Descoteaux PT, DPT 06/20/20, 10:31 AM [phone]

## 2020-06-20 NOTE — Care Management Important Message (Signed)
Important Message  Patient Details  Name: Vincent Cooper MRN: [id number] Date of Birth: 1941-10-04   Medicare Important Message Given:  Yes     Tommy Medal 06/20/2020, 1:20 PM

## 2020-06-21 ENCOUNTER — Encounter (HOSPITAL_COMMUNITY): Payer: Self-pay | Admitting: Emergency Medicine

## 2020-06-21 ENCOUNTER — Inpatient Hospital Stay (HOSPITAL_COMMUNITY): Payer: Medicare PPO | Admitting: Anesthesiology

## 2020-06-21 ENCOUNTER — Emergency Department (HOSPITAL_COMMUNITY): Payer: Medicare PPO

## 2020-06-21 ENCOUNTER — Inpatient Hospital Stay (HOSPITAL_COMMUNITY): Payer: Medicare PPO

## 2020-06-21 ENCOUNTER — Inpatient Hospital Stay (HOSPITAL_COMMUNITY)
Admission: EM | Admit: 2020-06-21 | Discharge: 2020-06-29 | DRG: 853 | Disposition: A | Discharge status: Skilled Nursing Facility | Payer: Medicare PPO | Attending: Family Medicine | Admitting: Family Medicine

## 2020-06-21 ENCOUNTER — Other Ambulatory Visit: Payer: Self-pay

## 2020-06-21 ENCOUNTER — Encounter (HOSPITAL_COMMUNITY)
Admission: EM | Disposition: A | Discharge status: Skilled Nursing Facility | Payer: Self-pay | Source: Home / Self Care | Attending: Family Medicine

## 2020-06-21 DIAGNOSIS — Z800 Family history of malignant neoplasm of digestive organs: Secondary | ICD-10-CM

## 2020-06-21 DIAGNOSIS — K658 Other peritonitis: Secondary | ICD-10-CM | POA: Diagnosis present

## 2020-06-21 DIAGNOSIS — N179 Acute kidney failure, unspecified: Secondary | ICD-10-CM | POA: Diagnosis not present

## 2020-06-21 DIAGNOSIS — Z452 Encounter for adjustment and management of vascular access device: Secondary | ICD-10-CM

## 2020-06-21 DIAGNOSIS — J441 Chronic obstructive pulmonary disease with (acute) exacerbation: Secondary | ICD-10-CM | POA: Diagnosis not present

## 2020-06-21 DIAGNOSIS — I110 Hypertensive heart disease with heart failure: Secondary | ICD-10-CM | POA: Diagnosis present

## 2020-06-21 DIAGNOSIS — J449 Chronic obstructive pulmonary disease, unspecified: Secondary | ICD-10-CM | POA: Diagnosis present

## 2020-06-21 DIAGNOSIS — E785 Hyperlipidemia, unspecified: Secondary | ICD-10-CM | POA: Diagnosis present

## 2020-06-21 DIAGNOSIS — Z87891 Personal history of nicotine dependence: Secondary | ICD-10-CM

## 2020-06-21 DIAGNOSIS — E1165 Type 2 diabetes mellitus with hyperglycemia: Secondary | ICD-10-CM | POA: Diagnosis present

## 2020-06-21 DIAGNOSIS — E8809 Other disorders of plasma-protein metabolism, not elsewhere classified: Secondary | ICD-10-CM | POA: Diagnosis present

## 2020-06-21 DIAGNOSIS — G92 Toxic encephalopathy: Secondary | ICD-10-CM | POA: Diagnosis not present

## 2020-06-21 DIAGNOSIS — K631 Perforation of intestine (nontraumatic): Secondary | ICD-10-CM | POA: Diagnosis present

## 2020-06-21 DIAGNOSIS — G4733 Obstructive sleep apnea (adult) (pediatric): Secondary | ICD-10-CM | POA: Diagnosis present

## 2020-06-21 DIAGNOSIS — Z7901 Long term (current) use of anticoagulants: Secondary | ICD-10-CM

## 2020-06-21 DIAGNOSIS — I2510 Atherosclerotic heart disease of native coronary artery without angina pectoris: Secondary | ICD-10-CM | POA: Diagnosis present

## 2020-06-21 DIAGNOSIS — I4891 Unspecified atrial fibrillation: Secondary | ICD-10-CM | POA: Diagnosis not present

## 2020-06-21 DIAGNOSIS — E119 Type 2 diabetes mellitus without complications: Secondary | ICD-10-CM | POA: Diagnosis not present

## 2020-06-21 DIAGNOSIS — Z6841 Body Mass Index (BMI) 40.0 and over, adult: Secondary | ICD-10-CM | POA: Diagnosis not present

## 2020-06-21 DIAGNOSIS — I2584 Coronary atherosclerosis due to calcified coronary lesion: Secondary | ICD-10-CM | POA: Diagnosis present

## 2020-06-21 DIAGNOSIS — T4275XA Adverse effect of unspecified antiepileptic and sedative-hypnotic drugs, initial encounter: Secondary | ICD-10-CM | POA: Diagnosis not present

## 2020-06-21 DIAGNOSIS — K5720 Diverticulitis of large intestine with perforation and abscess without bleeding: Secondary | ICD-10-CM | POA: Diagnosis present

## 2020-06-21 DIAGNOSIS — R6521 Severe sepsis with septic shock: Secondary | ICD-10-CM | POA: Diagnosis present

## 2020-06-21 DIAGNOSIS — M1A9XX0 Chronic gout, unspecified, without tophus (tophi): Secondary | ICD-10-CM | POA: Diagnosis present

## 2020-06-21 DIAGNOSIS — Z86711 Personal history of pulmonary embolism: Secondary | ICD-10-CM

## 2020-06-21 DIAGNOSIS — Z978 Presence of other specified devices: Secondary | ICD-10-CM

## 2020-06-21 DIAGNOSIS — K668 Other specified disorders of peritoneum: Secondary | ICD-10-CM | POA: Diagnosis not present

## 2020-06-21 DIAGNOSIS — I5032 Chronic diastolic (congestive) heart failure: Secondary | ICD-10-CM | POA: Diagnosis present

## 2020-06-21 DIAGNOSIS — Z9119 Patient's noncompliance with other medical treatment and regimen: Secondary | ICD-10-CM

## 2020-06-21 DIAGNOSIS — J9601 Acute respiratory failure with hypoxia: Secondary | ICD-10-CM | POA: Diagnosis not present

## 2020-06-21 DIAGNOSIS — I10 Essential (primary) hypertension: Secondary | ICD-10-CM | POA: Diagnosis present

## 2020-06-21 DIAGNOSIS — Z86718 Personal history of other venous thrombosis and embolism: Secondary | ICD-10-CM

## 2020-06-21 DIAGNOSIS — Z79899 Other long term (current) drug therapy: Secondary | ICD-10-CM

## 2020-06-21 DIAGNOSIS — Z7951 Long term (current) use of inhaled steroids: Secondary | ICD-10-CM

## 2020-06-21 DIAGNOSIS — D62 Acute posthemorrhagic anemia: Secondary | ICD-10-CM | POA: Diagnosis not present

## 2020-06-21 DIAGNOSIS — E876 Hypokalemia: Secondary | ICD-10-CM | POA: Diagnosis present

## 2020-06-21 DIAGNOSIS — A419 Sepsis, unspecified organism: Principal | ICD-10-CM | POA: Diagnosis present

## 2020-06-21 DIAGNOSIS — G2581 Restless legs syndrome: Secondary | ICD-10-CM | POA: Diagnosis present

## 2020-06-21 DIAGNOSIS — Z20822 Contact with and (suspected) exposure to covid-19: Secondary | ICD-10-CM | POA: Diagnosis present

## 2020-06-21 DIAGNOSIS — Z7984 Long term (current) use of oral hypoglycemic drugs: Secondary | ICD-10-CM

## 2020-06-21 DIAGNOSIS — R531 Weakness: Secondary | ICD-10-CM | POA: Diagnosis present

## 2020-06-21 DIAGNOSIS — J9600 Acute respiratory failure, unspecified whether with hypoxia or hypercapnia: Secondary | ICD-10-CM

## 2020-06-21 HISTORY — PX: LAPAROTOMY: SHX154

## 2020-06-21 LAB — URINALYSIS, ROUTINE W REFLEX MICROSCOPIC
Bilirubin Urine: NEGATIVE
Glucose, UA: 50 mg/dL — AB
Ketones, ur: 5 mg/dL — AB
Leukocytes,Ua: NEGATIVE
Nitrite: NEGATIVE
Protein, ur: 30 mg/dL — AB
Specific Gravity, Urine: 1.014 (ref 1.005–1.030)
pH: 5 (ref 5.0–8.0)

## 2020-06-21 LAB — CBC
HCT: 32.9 % — ABNORMAL LOW (ref 39.0–52.0)
Hemoglobin: 10.6 g/dL — ABNORMAL LOW (ref 13.0–17.0)
MCH: 32.9 pg (ref 26.0–34.0)
MCHC: 32.2 g/dL (ref 30.0–36.0)
MCV: 102.2 fL — ABNORMAL HIGH (ref 80.0–100.0)
Platelets: 415 10*3/uL — ABNORMAL HIGH (ref 150–400)
RBC: 3.22 MIL/uL — ABNORMAL LOW (ref 4.22–5.81)
RDW: 17.8 % — ABNORMAL HIGH (ref 11.5–15.5)
WBC: 13.8 10*3/uL — ABNORMAL HIGH (ref 4.0–10.5)
nRBC: 0.2 % (ref 0.0–0.2)

## 2020-06-21 LAB — SARS CORONAVIRUS 2 BY RT PCR (HOSPITAL ORDER, PERFORMED IN ~~LOC~~ HOSPITAL LAB): SARS Coronavirus 2: NEGATIVE

## 2020-06-21 LAB — COMPREHENSIVE METABOLIC PANEL
ALT: 24 U/L (ref 0–44)
AST: 18 U/L (ref 15–41)
Albumin: 3.4 g/dL — ABNORMAL LOW (ref 3.5–5.0)
Alkaline Phosphatase: 48 U/L (ref 38–126)
Anion gap: 13 (ref 5–15)
BUN: 37 mg/dL — ABNORMAL HIGH (ref 8–23)
CO2: 25 mmol/L (ref 22–32)
Calcium: 8.4 mg/dL — ABNORMAL LOW (ref 8.9–10.3)
Chloride: 99 mmol/L (ref 98–111)
Creatinine, Ser: 1.19 mg/dL (ref 0.61–1.24)
GFR calc Af Amer: >60 mL/min (ref >60)
GFR calc non Af Amer: 58 mL/min — ABNORMAL LOW (ref >60)
Glucose, Bld: 202 mg/dL — ABNORMAL HIGH (ref 70–99)
Potassium: 3.8 mmol/L (ref 3.5–5.1)
Sodium: 137 mmol/L (ref 135–145)
Total Bilirubin: 1 mg/dL (ref 0.3–1.2)
Total Protein: 6.3 g/dL — ABNORMAL LOW (ref 6.5–8.1)

## 2020-06-21 LAB — TROPONIN I (HIGH SENSITIVITY)
Troponin I (High Sensitivity): 5 ng/L (ref <18)
Troponin I (High Sensitivity): 5 ng/L (ref <18)

## 2020-06-21 LAB — ABO/RH: ABO/RH(D): O POS

## 2020-06-21 LAB — PREPARE RBC (CROSSMATCH)

## 2020-06-21 SURGERY — LAPAROTOMY, EXPLORATORY
Anesthesia: General

## 2020-06-21 MED ORDER — SODIUM CHLORIDE 0.9 % IR SOLN
Status: DC | PRN
  Administered 2020-06-21 (×3): 1000 mL

## 2020-06-21 MED ORDER — IPRATROPIUM-ALBUTEROL 0.5-2.5 (3) MG/3ML IN SOLN
3.0000 mL | RESPIRATORY_TRACT | Status: DC
  Administered 2020-06-22 (×4): 3 mL via RESPIRATORY_TRACT
  Filled 2020-06-21 (×6): qty 3

## 2020-06-21 MED ORDER — LIDOCAINE 2% (20 MG/ML) 5 ML SYRINGE
INTRAMUSCULAR | Status: AC
  Filled 2020-06-21: qty 5

## 2020-06-21 MED ORDER — FENTANYL CITRATE (PF) 250 MCG/5ML IJ SOLN
INTRAMUSCULAR | Status: AC
  Filled 2020-06-21: qty 10

## 2020-06-21 MED ORDER — CHLORHEXIDINE GLUCONATE CLOTH 2 % EX PADS: 6.0000 | MEDICATED_PAD | Freq: Once | CUTANEOUS | Status: DC

## 2020-06-21 MED ORDER — LACTATED RINGERS IV BOLUS
1000.0000 mL | Freq: Once | INTRAVENOUS | Status: AC
  Administered 2020-06-21: 1000 mL via INTRAVENOUS

## 2020-06-21 MED ORDER — ONDANSETRON HCL 4 MG/2ML IJ SOLN: 4.0000 mg | Freq: Four times a day (QID) | INTRAMUSCULAR | Status: DC | PRN

## 2020-06-21 MED ORDER — ONDANSETRON HCL 4 MG/2ML IJ SOLN
INTRAMUSCULAR | Status: AC
  Filled 2020-06-21: qty 2

## 2020-06-21 MED ORDER — ROCURONIUM BROMIDE 10 MG/ML (PF) SYRINGE
PREFILLED_SYRINGE | INTRAVENOUS | Status: AC
  Filled 2020-06-21: qty 10

## 2020-06-21 MED ORDER — MIDAZOLAM HCL 2 MG/2ML IJ SOLN
INTRAMUSCULAR | Status: AC
  Filled 2020-06-21: qty 2

## 2020-06-21 MED ORDER — FENTANYL CITRATE (PF) 250 MCG/5ML IJ SOLN
INTRAMUSCULAR | Status: AC
  Filled 2020-06-21: qty 5

## 2020-06-21 MED ORDER — PHENYLEPHRINE 40 MCG/ML (10ML) SYRINGE FOR IV PUSH (FOR BLOOD PRESSURE SUPPORT)
PREFILLED_SYRINGE | INTRAVENOUS | Status: AC
  Filled 2020-06-21: qty 10

## 2020-06-21 MED ORDER — LACTATED RINGERS IV SOLN: INTRAVENOUS | Status: DC | PRN

## 2020-06-21 MED ORDER — ALBUTEROL SULFATE (2.5 MG/3ML) 0.083% IN NEBU
5.0000 mg | INHALATION_SOLUTION | Freq: Once | RESPIRATORY_TRACT | Status: AC
  Administered 2020-06-21: 5 mg via RESPIRATORY_TRACT
  Filled 2020-06-21: qty 6

## 2020-06-21 MED ORDER — IPRATROPIUM BROMIDE 0.02 % IN SOLN
0.5000 mg | Freq: Once | RESPIRATORY_TRACT | Status: AC
  Administered 2020-06-21: 0.5 mg via RESPIRATORY_TRACT
  Filled 2020-06-21: qty 2.5

## 2020-06-21 MED ORDER — PIPERACILLIN-TAZOBACTAM 3.375 G IVPB
3.3750 g | Freq: Three times a day (TID) | INTRAVENOUS | Status: DC
  Administered 2020-06-22 – 2020-06-28 (×19): 3.375 g via INTRAVENOUS
  Filled 2020-06-21 (×24): qty 50

## 2020-06-21 MED ORDER — INSULIN ASPART 100 UNIT/ML ~~LOC~~ SOLN
0.0000 [IU] | SUBCUTANEOUS | Status: DC
  Administered 2020-06-22 (×6): 5 [IU] via SUBCUTANEOUS
  Administered 2020-06-23: 8 [IU] via SUBCUTANEOUS
  Administered 2020-06-23: 3 [IU] via SUBCUTANEOUS
  Administered 2020-06-23: 5 [IU] via SUBCUTANEOUS
  Administered 2020-06-23: 2 [IU] via SUBCUTANEOUS
  Administered 2020-06-23: 5 [IU] via SUBCUTANEOUS
  Administered 2020-06-23: 8 [IU] via SUBCUTANEOUS
  Administered 2020-06-26: 2 [IU] via SUBCUTANEOUS
  Administered 2020-06-27 – 2020-06-28 (×2): 3 [IU] via SUBCUTANEOUS
  Administered 2020-06-28: 2 [IU] via SUBCUTANEOUS
  Administered 2020-06-29: 3 [IU] via SUBCUTANEOUS
  Administered 2020-06-29: 2 [IU] via SUBCUTANEOUS

## 2020-06-21 MED ORDER — PROPOFOL 1000 MG/100ML IV EMUL
5.0000 ug/kg/min | INTRAVENOUS | Status: DC
  Administered 2020-06-22: 5 ug/kg/min via INTRAVENOUS
  Administered 2020-06-22: 25 ug/kg/min via INTRAVENOUS
  Filled 2020-06-21 (×3): qty 100

## 2020-06-21 MED ORDER — ONDANSETRON HCL 4 MG/2ML IJ SOLN
4.0000 mg | Freq: Once | INTRAMUSCULAR | Status: AC
  Administered 2020-06-21: 4 mg via INTRAVENOUS
  Filled 2020-06-21: qty 2

## 2020-06-21 MED ORDER — PROPOFOL 10 MG/ML IV BOLUS
INTRAVENOUS | Status: DC | PRN
  Administered 2020-06-21: 120 mg via INTRAVENOUS

## 2020-06-21 MED ORDER — DEXAMETHASONE SODIUM PHOSPHATE 10 MG/ML IJ SOLN
INTRAMUSCULAR | Status: AC
  Filled 2020-06-21: qty 1

## 2020-06-21 MED ORDER — HYDROCORTISONE NA SUCCINATE PF 100 MG IJ SOLR
INTRAMUSCULAR | Status: AC
  Filled 2020-06-21: qty 2

## 2020-06-21 MED ORDER — HEMOSTATIC AGENTS (NO CHARGE) OPTIME
TOPICAL | Status: DC | PRN
  Administered 2020-06-21: 1 via TOPICAL

## 2020-06-21 MED ORDER — SODIUM CHLORIDE 0.9 % IV SOLN
2.0000 g | INTRAVENOUS | Status: AC
  Administered 2020-06-21: 2 g via INTRAVENOUS
  Filled 2020-06-21 (×2): qty 2

## 2020-06-21 MED ORDER — PROPOFOL 10 MG/ML IV BOLUS
INTRAVENOUS | Status: AC
  Filled 2020-06-21: qty 20

## 2020-06-21 MED ORDER — METOPROLOL TARTRATE 5 MG/5ML IV SOLN
5.0000 mg | Freq: Four times a day (QID) | INTRAVENOUS | Status: DC | PRN
  Administered 2020-06-22: 5 mg via INTRAVENOUS
  Filled 2020-06-21: qty 5

## 2020-06-21 MED ORDER — MIDAZOLAM HCL 5 MG/5ML IJ SOLN
INTRAMUSCULAR | Status: DC | PRN
  Administered 2020-06-21 (×3): 2 mg via INTRAVENOUS

## 2020-06-21 MED ORDER — LACTATED RINGERS IV BOLUS: 1000.0000 mL | Freq: Once | INTRAVENOUS | Status: DC

## 2020-06-21 MED ORDER — ROCURONIUM BROMIDE 10 MG/ML (PF) SYRINGE
PREFILLED_SYRINGE | INTRAVENOUS | Status: DC | PRN
  Administered 2020-06-21: 50 mg via INTRAVENOUS
  Administered 2020-06-21: 100 mg via INTRAVENOUS

## 2020-06-21 MED ORDER — SUCCINYLCHOLINE CHLORIDE 200 MG/10ML IV SOSY
PREFILLED_SYRINGE | INTRAVENOUS | Status: AC
  Filled 2020-06-21: qty 10

## 2020-06-21 MED ORDER — SODIUM CHLORIDE 0.9 % IV SOLN: INTRAVENOUS | Status: DC

## 2020-06-21 MED ORDER — PIPERACILLIN-TAZOBACTAM 3.375 G IVPB 30 MIN
3.3750 g | Freq: Once | INTRAVENOUS | Status: AC
  Administered 2020-06-21: 3.375 g via INTRAVENOUS
  Filled 2020-06-21: qty 50

## 2020-06-21 MED ORDER — PROTHROMBIN COMPLEX CONC HUMAN 500 UNITS IV KIT
5000.0000 [IU] | PACK | Status: AC
  Administered 2020-06-21: 5000 [IU] via INTRAVENOUS
  Filled 2020-06-21: qty 5000

## 2020-06-21 MED ORDER — FENTANYL CITRATE (PF) 100 MCG/2ML IJ SOLN
50.0000 ug | Freq: Once | INTRAMUSCULAR | Status: AC
  Administered 2020-06-21: 50 ug via INTRAVENOUS
  Filled 2020-06-21: qty 2

## 2020-06-21 MED ORDER — ONDANSETRON HCL 4 MG PO TABS
4.0000 mg | ORAL_TABLET | Freq: Four times a day (QID) | ORAL | Status: DC | PRN
  Filled 2020-06-21: qty 1

## 2020-06-21 MED ORDER — MORPHINE SULFATE (PF) 2 MG/ML IV SOLN: 2.0000 mg | INTRAVENOUS | Status: DC | PRN

## 2020-06-21 MED ORDER — SODIUM CHLORIDE 0.9 % IV SOLN: INTRAVENOUS | Status: DC | PRN

## 2020-06-21 MED ORDER — EPHEDRINE 5 MG/ML INJ
INTRAVENOUS | Status: AC
  Filled 2020-06-21: qty 10

## 2020-06-21 MED ORDER — HYDROMORPHONE HCL 1 MG/ML IJ SOLN
0.5000 mg | Freq: Once | INTRAMUSCULAR | Status: AC
  Administered 2020-06-21: 0.5 mg via INTRAVENOUS
  Filled 2020-06-21: qty 1

## 2020-06-21 MED ORDER — SODIUM CHLORIDE 0.9% IV SOLUTION: Freq: Once | INTRAVENOUS | Status: DC

## 2020-06-21 MED ORDER — FLUCONAZOLE IN SODIUM CHLORIDE 400-0.9 MG/200ML-% IV SOLN
400.0000 mg | INTRAVENOUS | Status: DC
  Filled 2020-06-21: qty 200

## 2020-06-21 MED ORDER — HYDROCORTISONE NA SUCCINATE PF 100 MG IJ SOLR
INTRAMUSCULAR | Status: DC | PRN
  Administered 2020-06-21: 100 mg via INTRAVENOUS

## 2020-06-21 MED ORDER — IOHEXOL 300 MG/ML  SOLN
100.0000 mL | Freq: Once | INTRAMUSCULAR | Status: AC | PRN
  Administered 2020-06-21: 100 mL via INTRAVENOUS

## 2020-06-21 MED ORDER — LIDOCAINE HCL (CARDIAC) PF 100 MG/5ML IV SOSY
PREFILLED_SYRINGE | INTRAVENOUS | Status: DC | PRN
  Administered 2020-06-21: 100 mg via INTRATRACHEAL

## 2020-06-21 MED ORDER — SUCCINYLCHOLINE CHLORIDE 200 MG/10ML IV SOSY
PREFILLED_SYRINGE | INTRAVENOUS | Status: DC | PRN
  Administered 2020-06-21: 180 mg via INTRAVENOUS

## 2020-06-21 MED ORDER — METOPROLOL TARTRATE 5 MG/5ML IV SOLN
INTRAVENOUS | Status: DC | PRN
  Administered 2020-06-21: 2 mg via INTRAVENOUS
  Administered 2020-06-21: 1 mg via INTRAVENOUS

## 2020-06-21 MED ORDER — FENTANYL CITRATE (PF) 250 MCG/5ML IJ SOLN
INTRAMUSCULAR | Status: DC | PRN
  Administered 2020-06-21 (×2): 100 ug via INTRAVENOUS
  Administered 2020-06-21: 50 ug via INTRAVENOUS
  Administered 2020-06-21: 100 ug via INTRAVENOUS
  Administered 2020-06-21 (×3): 50 ug via INTRAVENOUS
  Administered 2020-06-21: 100 ug via INTRAVENOUS
  Administered 2020-06-21 (×3): 50 ug via INTRAVENOUS
  Administered 2020-06-21 (×2): 100 ug via INTRAVENOUS
  Administered 2020-06-21: 50 ug via INTRAVENOUS

## 2020-06-21 SURGICAL SUPPLY — 63 items
APPLIER CLIP 11 MED OPEN (CLIP)
APPLIER CLIP 13 LRG OPEN (CLIP)
BARRIER SKIN 2 3/4 (OSTOMY) IMPLANT
BARRIER SKIN 2 3/4 INCH (OSTOMY)
CELLS DAT CNTRL 66122 CELL SVR (MISCELLANEOUS) IMPLANT
CHLORAPREP W/TINT 26 (MISCELLANEOUS) ×3 IMPLANT
CLAMP POUCH DRAINAGE QUIET (OSTOMY) ×3 IMPLANT
CLIP APPLIE 11 MED OPEN (CLIP) IMPLANT
CLIP APPLIE 13 LRG OPEN (CLIP) IMPLANT
CLOTH BEACON ORANGE TIMEOUT ST (SAFETY) ×3 IMPLANT
COVER LIGHT HANDLE STERIS (MISCELLANEOUS) ×6 IMPLANT
COVER WAND RF STERILE (DRAPES) ×3 IMPLANT
DRAPE WARM FLUID 44X44 (DRAPES) ×3 IMPLANT
DRSG OPSITE POSTOP 4X10 (GAUZE/BANDAGES/DRESSINGS) ×6 IMPLANT
DRSG OPSITE POSTOP 4X8 (GAUZE/BANDAGES/DRESSINGS) ×9 IMPLANT
ELECT BLADE 6 FLAT ULTRCLN (ELECTRODE) IMPLANT
ELECT REM PT RETURN 9FT ADLT (ELECTROSURGICAL) ×3
ELECTRODE REM PT RTRN 9FT ADLT (ELECTROSURGICAL) ×1 IMPLANT
GLOVE BIO SURGEON STRL SZ 6.5 (GLOVE) ×6 IMPLANT
GLOVE BIO SURGEONS STRL SZ 6.5 (GLOVE) ×3
GLOVE BIOGEL M 6.5 STRL (GLOVE) ×6 IMPLANT
GLOVE BIOGEL PI IND STRL 6.5 (GLOVE) ×2 IMPLANT
GLOVE BIOGEL PI IND STRL 7.0 (GLOVE) ×3 IMPLANT
GLOVE BIOGEL PI IND STRL 7.5 (GLOVE) ×2 IMPLANT
GLOVE BIOGEL PI INDICATOR 6.5 (GLOVE) ×4
GLOVE BIOGEL PI INDICATOR 7.0 (GLOVE) ×6
GLOVE BIOGEL PI INDICATOR 7.5 (GLOVE) ×4
GLOVE SURG SS PI 7.5 STRL IVOR (GLOVE) ×12 IMPLANT
GOWN STRL REUS W/TWL LRG LVL3 (GOWN DISPOSABLE) ×9 IMPLANT
HANDLE SUCTION POOLE (INSTRUMENTS) ×1 IMPLANT
INST SET MAJOR GENERAL (KITS) ×3 IMPLANT
KIT REMOVER STAPLE SKIN (MISCELLANEOUS) IMPLANT
KIT TURNOVER KIT A (KITS) ×3 IMPLANT
LIGASURE IMPACT 36 18CM CVD LR (INSTRUMENTS) ×3 IMPLANT
MANIFOLD NEPTUNE II (INSTRUMENTS) ×3 IMPLANT
NEEDLE HYPO 18GX1.5 BLUNT FILL (NEEDLE) ×3 IMPLANT
NEEDLE HYPO 22GX1.5 SAFETY (NEEDLE) ×3 IMPLANT
NS IRRIG 1000ML POUR BTL (IV SOLUTION) ×6 IMPLANT
PACK MAJOR ABDOMINAL (CUSTOM PROCEDURE TRAY) ×3 IMPLANT
PAD ABD 5X9 TENDERSORB (GAUZE/BANDAGES/DRESSINGS) ×6 IMPLANT
PAD ARMBOARD 7.5X6 YLW CONV (MISCELLANEOUS) ×3 IMPLANT
PENCIL SMOKE EVACUATOR COATED (MISCELLANEOUS) ×3 IMPLANT
POUCH OSTOMY 2 3/4  H 3804 (WOUND CARE)
POUCH OSTOMY 2 PC DRNBL 2.75 (WOUND CARE) IMPLANT
RELOAD LINEAR CUT PROX 55 BLUE (ENDOMECHANICALS) IMPLANT
RELOAD PROXIMATE 75MM BLUE (ENDOMECHANICALS) ×9 IMPLANT
RETRACTOR WND ALEXIS 25 LRG (MISCELLANEOUS) ×1 IMPLANT
RTRCTR WOUND ALEXIS 18CM MED (MISCELLANEOUS)
RTRCTR WOUND ALEXIS 25CM LRG (MISCELLANEOUS) ×3
SET BASIN LINEN APH (SET/KITS/TRAYS/PACK) ×3 IMPLANT
SPONGE LAP 18X18 RF (DISPOSABLE) ×3 IMPLANT
STAPLER GUN LINEAR PROX 60 (STAPLE) IMPLANT
STAPLER PROXIMATE 75MM BLUE (STAPLE) ×3 IMPLANT
STAPLER VISISTAT (STAPLE) ×3 IMPLANT
SUCTION POOLE HANDLE (INSTRUMENTS) ×3
SUT CHROMIC 0 SH (SUTURE) IMPLANT
SUT CHROMIC 2 0 SH (SUTURE) IMPLANT
SUT CHROMIC 3 0 SH 27 (SUTURE) ×6 IMPLANT
SUT PDS AB CT VIOLET #0 27IN (SUTURE) ×6 IMPLANT
SUT PROLENE 2 0 SH 30 (SUTURE) IMPLANT
SUT SILK 3 0 SH CR/8 (SUTURE) ×3 IMPLANT
SYR 20ML LL LF (SYRINGE) ×6 IMPLANT
TRAY FOLEY MTR SLVR 16FR STAT (SET/KITS/TRAYS/PACK) ×3 IMPLANT

## 2020-06-21 NOTE — Consult Note (Signed)
[facility] Consult  Reason for Consult: Pneumoperitoneum Referring Physician:  Margarita Mail, PA   Chief Complaint    Abdominal Pain      HPI: Vincent Cooper is a 79 y.o. male with pneumoperitoneum found on CT without obvious etiology. He was just in the hospital with bilateral cellulitis to his lower extremities. He was discharged yesterday and says that at [facility] this morning he felt something pop and developed extreme abdominal pain. The pain is sharp in nature and constant.  He denies any nausea or vomiting.  He denies any history of diverticulitis or stomach ulcers. He has not had a colonoscopy in 10 years. He says that he has had normal BMs without blood.   He has a history of COPD, Sleep apnea, CAD, DM, HTN, Obesity and has never had surgeries. His mother colon cancer.  He had a PE 10/2019 and is on Eliquis. He says he took his pills yesterday. He is currently on prednisone and an antibiotic for his cellulitis.   Daughter Vincent Cooper at bedside and is the decision maker for him if he becomes incapable of making decisions. He is a Full code.   Past Medical History:  Diagnosis Date  . Chronic gout   . COPD (chronic obstructive pulmonary disease) ([place])    Oxygen dependent  . 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 CAD-RADS 4 - Severe.  ? >50% LM stenosis -->CTFFR -- NOT PHYSIOLOGICALLY SIGNIFICANT; Large Dom RCA-<PDA/PLA.  Diffuse mild to mod plaque (25-49%) prox segment and mod (50-69%) mid-distal;  CTFFR: prox 0.95, mid 0.88, distal 0.86 (not significant); Med-sized LAD w/ long diffuse mod-severe plaque calcified plaque in prox-mid ~ 50 -69%, ? >70% -> CTFFR :   . Diabetes mellitus type II, non insulin dependent ([place])   . Hypertension   . Morbid obesity (H[facility])    BMI of 38.5 with multiple risk factors.  . OSA on CPAP   . Pulmonary emboli ([place]) 10/2019   Chest CTA-4 Phadke opacification of main PA but  there is partially occlusive main posterior RLL and segmental/segmental branches.  Small thrombus noted in the anterior right middle lobe.  No RV strain.  Scattered aortic atherosclerosis involving great vessels.  Coronary calcification noted.  Marland Kitchen RLS (restless legs syndrome)     Past Surgical History:  Procedure Laterality Date  . Cataract surgery Right     Family History  Problem Relation Age of Onset  . Colon cancer Mother   . CAD Neg Hx   . Diabetes Neg Hx     Social History   Tobacco Use  . Smoking status: Former Research scientist (life sciences)  . Smokeless tobacco: Former Network engineer  . Vaping Use: Never used  Substance Use Topics  . Alcohol use: Not Currently  . Drug use: Never    Medications: I have reviewed the patient's current medications. Current Facility-Administered Medications  Medication Dose Route Frequency Provider Last Rate Last Admin  . 0.9 %  sodium chloride infusion (Manually program via Guardrails IV Fluids)   Intravenous Once Virl Cagey, MD      . cefoTEtan (CEFOTAN) 2 g in sodium chloride 0.9 % 100 mL IVPB  2 g Intravenous On Call to OR Virl Cagey, MD      . Chlorhexidine Gluconate Cloth 2 % PADS 6 each  6 each Topical Once Virl Cagey, MD      . fluconazole (DIFLUCAN) IVPB 400 mg  400 mg Intravenous Q24H Elgergawy, Silver Huguenin, MD      . ipratropium-albuterol (DUONEB) 0.5-2.5 (3) MG/3ML nebulizer solution 3 mL  3 mL Nebulization Q4H Elgergawy, Dawood S, MD      . lactated ringers bolus 1,000 mL  1,000 mL Intravenous Once Virl Cagey, MD       Current Outpatient Medications  Medication Sig Dispense Refill Last Dose  . albuterol (VENTOLIN HFA) 108 (90 Base) MCG/ACT inhaler Inhale 1 puff into the lungs every 6 (six) hours as needed for wheezing or shortness of breath.     Marland Kitchen apixaban (ELIQUIS) 5 MG TABS tablet Take 1 tablet (5 mg total) by mouth 2 (two) times daily.     . cefdinir (OMNICEF) 300 MG capsule Take 1 capsule (300 mg total) by mouth  every 12 (twelve) hours. 14 capsule 0   . diltiazem (CARDIZEM CD) 180 MG 24 hr capsule Take 2 capsules (360 mg total) by mouth daily. (Patient taking differently: Take 360 mg by mouth at bedtime. ) 90 capsule 2   . doxycycline (VIBRAMYCIN) 100 MG capsule Take 100 mg by mouth 2 (two) times daily. For 10 days     . glimepiride (AMARYL) 2 MG tablet Take 2 mg by mouth daily with breakfast.     . guaiFENesin (MUCINEX) 600 MG 12 hr tablet Take 1 tablet (600 mg total) by mouth 2 (two) times daily. 30 tablet 0   . lisinopril (ZESTRIL) 40 MG tablet Take 0.5 tablets (20 mg total) by mouth daily. (Patient taking differently: Take 20 mg by mouth at bedtime. ) 30 tablet 6   . metFORMIN (GLUCOPHAGE) 500 MG tablet Take 500 mg by mouth 2 (two) times daily with a meal.     . nitroGLYCERIN (NITROSTAT) 0.4 MG SL tablet Place 1 tablet (0.4 mg total) under the tongue every 5 (five) minutes as needed for chest pain. 90 tablet 3   . predniSONE (DELTASONE) 10 MG tablet Take 6 tablets (60 mg total) by mouth daily with breakfast. And decrease by one tablet daily 21 tablet 0   . rosuvastatin (CRESTOR) 40 MG tablet Take 1 tablet (40 mg total) by mouth daily. 90 tablet 3   . torsemide (DEMADEX) 20 MG tablet Take 20 mg by mouth 2 (two) times daily.     . TRELEGY ELLIPTA 100-62.5-25 MCG/INH AEPB Inhale 1 puff into the lungs daily.     Marland Kitchen triamcinolone cream (KENALOG) 0.1 % Apply 1 application topically 2 (two) times daily.      Allergies  Allergen Reactions  . Other     Patient reports he was allergic to something in an IV he was given but does not know what the substance was. As of 01/19/2020      ROS:  A comprehensive review of systems was negative except for: Respiratory: positive for SOB Gastrointestinal: positive for abdominal pain  Blood pressure 113/81, pulse (!) 102, temperature 98.2 F (36.8 C), temperature source Oral, resp. rate (!) 24, height 5\' 9"  (1.753 m), weight 117.5 kg, SpO2 94 %. Physical Exam Vitals  reviewed.  Constitutional:      Appearance: He is obese.  HENT:     Head: Normocephalic and atraumatic.  Eyes:     Extraocular Movements: Extraocular movements intact.  Cardiovascular:     Rate and Rhythm: Regular rhythm. Tachycardia present.  Pulmonary:     Comments: Pursing lips, some increased effort Abdominal:     General: There is distension.     Palpations: Abdomen is  rigid.     Tenderness: There is generalized abdominal tenderness.     Comments: More rigid in the upper abdomen, tender with rebound  Skin:    General: Skin is warm.  Neurological:     General: No focal deficit present.     Mental Status: He is alert and oriented to person, place, and time.  Psychiatric:        Mood and Affect: Mood normal.        Behavior: Behavior normal.     Results: Results for orders placed or performed during the hospital encounter of 06/21/20 (from the past 48 hour(s))  Urinalysis, Routine w reflex microscopic Urine, Clean Catch     Status: Abnormal   Collection Time: 06/21/20 12:29 PM  Result Value Ref Range   Color, Urine YELLOW YELLOW   APPearance HAZY (A) CLEAR   Specific Gravity, Urine 1.014 1.005 - 1.030   pH 5.0 5.0 - 8.0   Glucose, UA 50 (A) NEGATIVE mg/dL   Hgb urine dipstick MODERATE (A) NEGATIVE   Bilirubin Urine NEGATIVE NEGATIVE   Ketones, ur 5 (A) NEGATIVE mg/dL   Protein, ur 30 (A) NEGATIVE mg/dL   Nitrite NEGATIVE NEGATIVE   Leukocytes,Ua NEGATIVE NEGATIVE   RBC / HPF 0-5 0 - 5 RBC/hpf   WBC, UA 0-5 0 - 5 WBC/hpf   Bacteria, UA RARE (A) NONE SEEN   Squamous Epithelial / LPF 0-5 0 - 5   Mucus PRESENT    Hyaline Casts, UA PRESENT    Amorphous Crystal PRESENT     Comment: Performed at [facility], [address] [place], [place] [address]  Comprehensive metabolic panel     Status: Abnormal   Collection Time: 06/21/20 12:40 PM  Result Value Ref Range   Sodium 137 135 - 145 mmol/L   Potassium 3.8 3.5 - 5.1 mmol/L   Chloride 99 98 - 111 mmol/L   CO2 25 22  - 32 mmol/L   Glucose, Bld 202 (H) 70 - 99 mg/dL    Comment: Glucose reference range applies only to samples taken after fasting for at least 8 hours.   BUN 37 (H) 8 - 23 mg/dL   Creatinine, Ser 1.19 0.61 - 1.24 mg/dL   Calcium 8.4 (L) 8.9 - 10.3 mg/dL   Total Protein 6.3 (L) 6.5 - 8.1 g/dL   Albumin 3.4 (L) 3.5 - 5.0 g/dL   AST 18 15 - 41 U/L   ALT 24 0 - 44 U/L   Alkaline Phosphatase 48 38 - 126 U/L   Total Bilirubin 1.0 0.3 - 1.2 mg/dL   GFR calc non Af Amer 58 (L) >60 mL/min   GFR calc Af Amer >60 >60 mL/min   Anion gap 13 5 - 15    Comment: Performed at [facility], [address] [place], [place] [address]  CBC     Status: Abnormal   Collection Time: 06/21/20 12:40 PM  Result Value Ref Range   WBC 13.8 (H) 4.0 - 10.5 K/uL   RBC 3.22 (L) 4.22 - 5.81 MIL/uL   Hemoglobin 10.6 (L) 13.0 - 17.0 g/dL   HCT 32.9 (L) 39 - 52 %   MCV 102.2 (H) 80.0 - 100.0 fL   MCH 32.9 26.0 - 34.0 pg   MCHC 32.2 30.0 - 36.0 g/dL   RDW 17.8 (H) 11.5 - 15.5 %   Platelets 415 (H) 150 - 400 K/uL   nRBC 0.2 0.0 - 0.2 %    Comment: Performed at  [address] [place], [place] [address]  Troponin I (High Sensitivity)     Status: None   Collection Time: 06/21/20 12:40 PM  Result Value Ref Range   Troponin I (High Sensitivity) 5 <18 ng/L    Comment: (NOTE) Elevated high sensitivity troponin I (hsTnI) values and significant  changes across serial measurements may suggest ACS but many other  chronic and acute conditions are known to elevate hsTnI results.  Refer to the "Links" section for chest pain algorithms and additional  guidance. Performed at [facility], [address] [place], [place] [address]   SARS Coronavirus 2 by RT PCR (hospital order, performed in [facility] hospital lab) Nasopharyngeal Nasopharyngeal Swab     Status: None   Collection Time: 06/21/20  1:47 PM   Specimen: Nasopharyngeal Swab  Result Value Ref Range   SARS Coronavirus 2 NEGATIVE NEGATIVE    Comment:  (NOTE) SARS-CoV-2 target nucleic acids are NOT DETECTED.  The SARS-CoV-2 RNA is generally detectable in upper and lower respiratory specimens during the acute phase of infection. The lowest concentration of SARS-CoV-2 viral copies this assay can detect is 250 copies / mL. A negative result does not preclude SARS-CoV-2 infection and should not be used as the sole basis for treatment or other patient management decisions.  A negative result may occur with improper specimen collection / handling, submission of specimen other than nasopharyngeal swab, presence of viral mutation(s) within the areas targeted by this assay, and inadequate number of viral copies (<250 copies / mL). A negative result must be combined with clinical observations, patient history, and epidemiological information.  Fact Sheet for Patients:   StrictlyIdeas.no  Fact Sheet for Healthcare Providers: BankingDealers.co.za  This test is not yet approved or  cleared by the [place] FDA and has been authorized for detection and/or diagnosis of SARS-CoV-2 by FDA under an Emergency Use Authorization (EUA).  This EUA will remain in effect (meaning this test can be used) for the duration of the COVID-19 declaration under Section 564(b)(1) of the Act, 21 U.S.C. section 360bbb-3(b)(1), unless the authorization is terminated or revoked sooner.  Performed at [facility], [address], [place], [place] [address]   Troponin I (High Sensitivity)     Status: None   Collection Time: 06/21/20  4:11 PM  Result Value Ref Range   Troponin I (High Sensitivity) 5 <18 ng/L    Comment: (NOTE) Elevated high sensitivity troponin I (hsTnI) values and significant  changes across serial measurements may suggest ACS but many other  chronic and acute conditions are known to elevate hsTnI results.  Refer to the "Links" section for chest pain algorithms and additional  guidance. Performed at  [facility], [address], [place], [place] [address]    Personally reviewed- anterior pneumoperitoneum but no obvious thickening of the colon or stomach, more air around the stomach  CT ABDOMEN PELVIS W CONTRAST  Result Date: 06/21/2020 CLINICAL DATA:  Acute left lower quadrant abdominal pain. EXAM: CT ABDOMEN AND PELVIS WITH CONTRAST TECHNIQUE: Multidetector CT imaging of the abdomen and pelvis was performed using the standard protocol following bolus administration of intravenous contrast. CONTRAST:  178mL OMNIPAQUE IOHEXOL 300 MG/ML  SOLN COMPARISON:  None. FINDINGS: Lower chest: No acute abnormality. Hepatobiliary: Solitary gallstone is noted. No biliary dilatation is noted. The liver is unremarkable. Pancreas: Unremarkable. No pancreatic ductal dilatation or surrounding inflammatory changes. Spleen: Normal in size without focal abnormality. Adrenals/Urinary Tract: Adrenal glands are unremarkable. Kidneys are normal, without renal  calculi, focal lesion, or hydronephrosis. Bladder is unremarkable. Stomach/Bowel: Moderate amount of free air is noted in the peritoneal space, particularly in the epigastric region. This is concerning for rupture of hollow viscus. However the source is not clearly identified. The stomach and appendix are unremarkable. There is no evidence of bowel obstruction or inflammation. Sigmoid diverticulosis is noted. Vascular/Lymphatic: Aortic atherosclerosis. No enlarged abdominal or pelvic lymph nodes. Reproductive: Prostate is unremarkable. Other: No abdominal wall hernia or abnormality. No abdominopelvic ascites. Musculoskeletal: No acute or significant osseous findings. IMPRESSION: 1. Moderate amount of free air is noted in the peritoneal space, particularly in the epigastric region. This is concerning for rupture of hollow viscus. However the source is not clearly identified. Critical Value/emergent results were called by telephone at the time of interpretation on 06/21/2020 at  3:45 pm to provider ABIGAIL HARRIS , who verbally acknowledged these results. 2. Sigmoid diverticulosis without inflammation. 3. Solitary gallstone. Aortic Atherosclerosis (ICD10-I70.0). Electronically Signed   By: Marijo Conception M.D.   On: 06/21/2020 15:47   DG Chest Port 1 View  Result Date: 06/21/2020 CLINICAL DATA:  Shortness of breath.  History of COPD. EXAM: PORTABLE CHEST 1 VIEW COMPARISON:  Single-view of the chest 06/17/2020 and 11/13/2019. CT chest 11/06/2019. FINDINGS: The lungs are emphysematous. Mild subsegmental atelectasis is seen in the lung bases. The lungs are otherwise clear. Heart size is normal. Aortic atherosclerosis. No pneumothorax or pleural fluid. No acute bony abnormality. IMPRESSION: No acute disease. Aortic Atherosclerosis (ICD10-I70.0) and Emphysema (ICD10-J43.9). Electronically Signed   By: Inge Rise M.D.   On: 06/21/2020 13:58     Assessment & Plan:  Vincent Cooper is a 79 y.o. male with pneumoperitoneum of unknown etiology, on Eliquis after PE with multiple medical co-morbidities including HTN, DM, CHF, COPD, sleep apnea. We have attempted to see if he could be transferred to [facility] but due to the bed shortage this is not going to be an option for some time. He needs emergency surgery. We will have to do his surgery here and care for him the best we can versus transferring if and when it is possible.   -Exploratory laparotomy, possible bowel resection, possible ostomy, central line and arterial line placement discussed with patient and daughter. Risk of bleeding especially with his Eliquis, risk of infection, risk of needing a ostomy, risk of finding cancer, risk of prolonged ICU stay, risk of needing to stay intubated and tracheostomy, risk of rehab/ SNF placement. Discussed open wound post op. He wants everything done.    -Type and Cross stat for 2 units now. Unsure of [name]'s reversal products but will touch base with pharmacy if any bleeding issues.  -Will  get arterial and central line in OR.  -COVID negative   All questions were answered to the satisfaction of the patient and family.   Virl Cagey 06/21/2020, 6:29 PM

## 2020-06-21 NOTE — ED Provider Notes (Signed)
[facility] EMERGENCY DEPARTMENT Provider Note   CSN: [id number] Arrival date & time: 06/21/20  1219     History Chief Complaint  Patient presents with  . Abdominal Pain    Vincent Cooper is a 79 y.o. male who  has a past medical history of Chronic gout, COPD (chronic obstructive pulmonary disease) ([place]), Coronary artery disease due to calcified coronary lesion (03/08/2020), Diabetes mellitus type II, non insulin dependent ([place]), Hypertension, Morbid obesity ([place]), OSA on CPAP, Pulmonary emboli ([place]) (10/2019), and RLS (restless legs syndrome). He presents to the ED with a cc abd pain.  Patient states that around 5:00 in the morning he awoke with severe left lower quadrant abdominal pain which she describes as stabbing "like someone put a knife in me."  He states that initially the pain was severe and is still present but moderate.  He states that initially the pain "shot up into my left shoulder" but that was fleeting and resolved.  He denies nausea, vomiting, urinary symptoms, diarrhea or constipation.  He states that he feels like something "exploded in my stomach."    HPI     Past Medical History:  Diagnosis Date  . Chronic gout   . COPD (chronic obstructive pulmonary disease) ([place])    Oxygen dependent  . 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 CAD-RADS 4 - Severe.  ? >50% LM stenosis -->CTFFR -- NOT PHYSIOLOGICALLY SIGNIFICANT; Large Dom RCA-<PDA/PLA.  Diffuse mild to mod plaque (25-49%) prox segment and mod (50-69%) mid-distal;  CTFFR: prox 0.95, mid 0.88, distal 0.86 (not significant); Med-sized LAD w/ long diffuse mod-severe plaque calcified plaque in prox-mid ~ 50 -69%, ? >70% -> CTFFR :   . Diabetes mellitus type II, non insulin dependent ([place])   . Hypertension   . Morbid obesity (HCC)    BMI of 38.5 with multiple risk factors.  . OSA on CPAP   . Pulmonary emboli ([place]) 10/2019   Chest CTA-4 [place]  opacification of main PA but there is partially occlusive main posterior RLL and segmental/segmental branches.  Small thrombus noted in the anterior right middle lobe.  No RV strain.  Scattered aortic atherosclerosis involving great vessels.  Coronary calcification noted.  Marland Kitchen RLS (restless legs syndrome)     Patient Active Problem List   Diagnosis Date Noted  . Cellulitis, leg 06/17/2020  . AKI (acute kidney injury) ([place]) 06/17/2020  . Coronary artery disease, non-occlusive 03/20/2020  . Hyperlipidemia with target LDL less than 70 03/20/2020  . Acute respiratory failure with hypoxia ([place])   . Bronchiectasis with acute exacerbation ([place])   . Chronic diastolic HF (heart failure) ([place])   . Chest pain with moderate risk for cardiac etiology 01/19/2020  . Shortness of breath 01/19/2020  . Morbid obesity ([place]) 01/19/2020  . COPD with acute exacerbation ([place]) 01/19/2020  . Cardiac murmur 11/07/2019  . COPD (chronic obstructive pulmonary disease) ([place]) 11/07/2019  . Diabetes mellitus type II, non insulin dependent ([place]) 11/07/2019  . OSA and COPD overlap syndrome ([place]) 11/07/2019  . Essential hypertension 11/07/2019  . Single subsegmental pulmonary embolism without acute cor pulmonale ([place]) 11/06/2019    Past Surgical History:  Procedure Laterality Date  . Cataract surgery Right        Family History  Problem Relation Age of Onset  . CAD Neg Hx   . Diabetes Neg Hx     Social History   Tobacco Use  . Smoking status:  Former Smoker  . Smokeless tobacco: Former Network engineer  . Vaping Use: Never used  Substance Use Topics  . Alcohol use: Not Currently  . Drug use: Never    Home Medications Prior to Admission medications   Medication Sig Start Date End Date Taking? Authorizing Provider  albuterol (VENTOLIN HFA) 108 (90 Base) MCG/ACT inhaler Inhale 1 puff into the lungs every 6 (six) hours as needed for wheezing or shortness of breath.    [provider]  apixaban  (ELIQUIS) 5 MG TABS tablet Take 1 tablet (5 mg total) by mouth 2 (two) times daily. 01/21/20   Barton Dubois, MD  cefdinir (OMNICEF) 300 MG capsule Take 1 capsule (300 mg total) by mouth every 12 (twelve) hours. 06/20/20   Orson Eva, MD  diltiazem (CARDIZEM CD) 180 MG 24 hr capsule Take 2 capsules (360 mg total) by mouth daily. Patient taking differently: Take 360 mg by mouth at bedtime.  03/15/20   Leonie Man, MD  doxycycline (VIBRAMYCIN) 100 MG capsule Take 100 mg by mouth 2 (two) times daily. For 10 days    [provider]  glimepiride (AMARYL) 2 MG tablet Take 2 mg by mouth daily with breakfast.    [provider]  guaiFENesin (MUCINEX) 600 MG 12 hr tablet Take 1 tablet (600 mg total) by mouth 2 (two) times daily. 01/21/20   Barton Dubois, MD  lisinopril (ZESTRIL) 40 MG tablet Take 0.5 tablets (20 mg total) by mouth daily. Patient taking differently: Take 20 mg by mouth at bedtime.  03/15/20   Leonie Man, MD  metFORMIN (GLUCOPHAGE) 500 MG tablet Take 500 mg by mouth 2 (two) times daily with a meal.    [provider]  nitroGLYCERIN (NITROSTAT) 0.4 MG SL tablet Place 1 tablet (0.4 mg total) under the tongue every 5 (five) minutes as needed for chest pain. 01/19/20 06/17/20  Leonie Man, MD  predniSONE (DELTASONE) 10 MG tablet Take 6 tablets (60 mg total) by mouth daily with breakfast. And decrease by one tablet daily 06/21/20   Tat, Shanon Brow, MD  rosuvastatin (CRESTOR) 40 MG tablet Take 1 tablet (40 mg total) by mouth daily. 03/20/20 06/18/20  Leonie Man, MD  torsemide (DEMADEX) 20 MG tablet Take 20 mg by mouth 2 (two) times daily. 05/19/20   [provider]  TRELEGY ELLIPTA 100-62.5-25 MCG/INH AEPB Inhale 1 puff into the lungs daily. 05/12/20   [provider]  triamcinolone cream (KENALOG) 0.1 % Apply 1 application topically 2 (two) times daily. 06/13/20   [provider]    Allergies    Other  Review of Systems   Review of  Systems Ten systems reviewed and are negative for acute change, except as noted in the HPI.   Physical Exam Updated Vital Signs BP 138/72 (BP Location: Right Arm)   Pulse (!) 104   Temp 98.3 F (36.8 C) (Oral)   Resp 18   Ht 5\' 9"  (1.753 m)   Wt 117.5 kg   SpO2 96%   BMI 38.25 kg/m   Physical Exam Vitals and nursing note reviewed.  Constitutional:      General: He is not in acute distress.    Appearance: He is well-developed. He is obese. He is not diaphoretic.  HENT:     Head: Normocephalic and atraumatic.  Eyes:     General: No scleral icterus.    Conjunctiva/sclera: Conjunctivae normal.  Cardiovascular:     Rate and Rhythm: Regular rhythm. Tachycardia  present.     Heart sounds: Normal heart sounds.  Pulmonary:     Effort: Tachypnea present. No respiratory distress.     Breath sounds: Normal breath sounds.     Comments: Breathing is labored.  Patient is dyspneic at rest speaking in staccato sentences.  He states this is his baseline whenever he "moves or does something." Breath sounds are distant secondary to body habitus  Abdominal:     General: Abdomen is protuberant.     Palpations: Abdomen is soft.     Tenderness: There is abdominal tenderness in the left lower quadrant. There is guarding.     Comments: Obese, firm, protuberant abdomen.  Exquisitely tender to palpation in the left lower quadrant.  Musculoskeletal:     Cervical back: Normal range of motion and neck supple.     Right lower leg: 3+ Edema present.     Left lower leg: 3+ Edema present.  Skin:    General: Skin is warm and dry.  Neurological:     Mental Status: He is alert.  Psychiatric:        Behavior: Behavior normal.     ED Results / Procedures / Treatments   Labs (all labs ordered are listed, but only abnormal results are displayed) Labs Reviewed  COMPREHENSIVE METABOLIC PANEL - Abnormal; Notable for the following components:      Result Value   Glucose, Bld 202 (*)    BUN 37 (*)     Calcium 8.4 (*)    Total Protein 6.3 (*)    Albumin 3.4 (*)    GFR calc non Af Amer 58 (*)    All other components within normal limits  CBC - Abnormal; Notable for the following components:   WBC 13.8 (*)    RBC 3.22 (*)    Hemoglobin 10.6 (*)    HCT 32.9 (*)    MCV 102.2 (*)    RDW 17.8 (*)    Platelets 415 (*)    All other components within normal limits  URINALYSIS, ROUTINE W REFLEX MICROSCOPIC    EKG None  Radiology No results found.  Procedures .Critical Care Performed by: Margarita Mail, PA-C Authorized by: Margarita Mail, PA-C   Critical care provider statement:    Critical care time (minutes):  60   Critical care time was exclusive of:  Separately billable procedures and treating other patients   Critical care was necessary to treat or prevent imminent or life-threatening deterioration of the following conditions: Surgical abdomen with free air, perforated viscus/surgical emergency.   Critical care was time spent personally by me on the following activities:  Discussions with consultants, evaluation of patient's response to treatment, examination of patient, ordering and performing treatments and interventions, ordering and review of laboratory studies, ordering and review of radiographic studies, pulse oximetry, re-evaluation of patient's condition, obtaining history from patient or surrogate and review of old charts   (including critical care time)  Medications Ordered in ED Medications - No data to display  ED Course  I have reviewed the triage vital signs and the nursing notes.  Pertinent labs & imaging results that were available during my care of the patient were reviewed by me and considered in my medical decision making (see chart for details).  Clinical Course as of Jun 22 1855  Wed Jun 21, 2020  1443 Sinus tachycardia at a rate of 100  EKG 12-Lead [AH]  1443 I reviewed Port 1 view cxr images which show no acute abnormalities  DG  Chest Port 1 View  [AH]  1444 Leukopenia  CBC(!) [AH]  1444 Hgb at [place]  Hemoglobin(!): 10.6 [AH]  1445 Elevated blood sugar at 202  Cr wnl,  Mildly low protein and calcium of insignificant value  Comprehensive metabolic panel(!) [AH]  4709 Troponin wnl  Troponin I (High Sensitivity): 5 [AH]  8590 79 year old male complaining of acute onset of left lower quadrant abdominal pain that woke him up this morning.  No urinary symptoms.  Soft on exam although tender.  Feels his breathing is at baseline.  Getting labs and CT abdomen and pelvis.  Disposition per results of testing.   [MB]  1602 Emergent consult from radiology- patient haas    [AH]  6283 [place] [AH]    Clinical Course User Index [AH] Margarita Mail, PA-C [MB] Hayden Rasmussen, MD   MDM Rules/Calculators/A&P                          CC: Abdominal pain VS:  Vitals:   06/21/20 1700 06/21/20 1701 06/21/20 1730 06/21/20 1731  BP: 117/67  113/81   Pulse: 99  (!) 102   Resp: (!) 21  (!) 24   Temp:      TempSrc:      SpO2:  91%  94%  Weight:      Height:        MO:QHUTMLY is gathered by patient and EMR. Previous records obtained and reviewed. DDX:The patient's complaint of abdominal pain involves an extensive number of diagnostic and treatment options, and is a complaint that carries with it a high risk of complications, morbidity, and potential mortality. Given the large differential diagnosis, medical decision making is of high complexity. The differential diagnosis for generalized abdominal pain includes, but is not limited to AAA, gastroenteritis, appendicitis, Bowel obstruction, Bowel perforation. Gastroparesis, DKA, Hernia, Inflammatory bowel disease, mesenteric ischemia, pancreatitis, peritonitis SBP, volvulus.  Labs: Labs reviewed in ED patient care timeline.  Subsequent Covid test is negative. Imaging: I ordered and reviewed images which included 1 view chest x-ray and CT abdomen pelvis. I independently  visualized and interpreted all imaging. Significant findings include free air on the CT abdomen and pelvis.  Chest x-ray independently reviewed during the ED course.  Please see above.  EKG: Sinus tachycardia at a rate of 100 Consults: Case discussed with Dr. Constance Haw, Dr. Redmond Pulling, and Dr. Waldron Labs.  Reviewed the images and lab results MDM: Patient here with abdominal pain.  He is critically ill with  free air on his CT scan with concern for perforated viscus.  Patient has been given fluids and Zosyn for coverage of infection.  Patient notably labored in his breathing which I believe is secondary to his COPD.  I have ordered albuterol and respiratory therapy by bedside to try to optimize him prior to his emergent surgical procedure.  Patient is stable here in the emergency department.  Plan to go to the OR within the next hour or 2. Patient disposition: Admit The patient appears reasonably stabilized for admission considering the current resources, flow, and capabilities available in the ED at this time, and I doubt any other [facility] requiring further screening and/or treatment in the ED prior to admission.        Final Clinical Impression(s) / ED Diagnoses Final diagnoses:  None    Rx / DC Orders ED Discharge Orders    None       Margarita Mail, PA-C 06/21/20 1901  Hayden Rasmussen, MD 06/22/20 1018

## 2020-06-21 NOTE — Anesthesia Procedure Notes (Signed)
Procedure Name: Intubation Date/Time: 06/21/2020 7:48 PM Performed by: Denese Killings, MD Pre-anesthesia Checklist: Patient identified, Emergency Drugs available, Suction available and Patient being monitored Patient Re-evaluated:Patient Re-evaluated prior to induction Oxygen Delivery Method: Circle system utilized Preoxygenation: Pre-oxygenation with 100% oxygen Induction Type: IV induction and Rapid sequence Laryngoscope Size: Glidescope Grade View: Grade I Tube type: Oral Tube size: 8.0 mm Number of attempts: 1 Airway Equipment and Method: Stylet Placement Confirmation: ETT inserted through vocal cords under direct vision,  positive ETCO2 and breath sounds checked- equal and bilateral Secured at: 24 cm Tube secured with: Tape Dental Injury: Teeth and Oropharynx as per pre-operative assessment

## 2020-06-21 NOTE — Anesthesia Procedure Notes (Addendum)
Arterial Line Insertion Start/End8/02/2020 8:24 PM Performed by: Denese Killings, MD, anesthesiologist  Patient location: OR. Preanesthetic checklist: patient identified, IV checked, site marked, risks and benefits discussed, surgical consent, monitors and equipment checked, pre-op evaluation, timeout performed and anesthesia consent radial was placed Catheter size: 20 G Hand hygiene performed  and maximum sterile barriers used   Attempts: 2 Procedure performed without using ultrasound guided technique. Post-procedure: tegaderm. Post procedure assessment: normal  Patient tolerated the procedure well with no immediate complications. Additional procedure comments: Left side arterial line was attempted by Dr. Constance Haw.

## 2020-06-21 NOTE — ED Triage Notes (Signed)
Pt reports left sided abdominal pain/pulling sensation that radiated to left arm this am when waking. Pt denies left arm pain, chest pain, shortness of breath at this time. LBM 06/20/20.

## 2020-06-21 NOTE — Op Note (Signed)
[facility] Operative Note  06/21/20  Preoperative Diagnosis:  Pneumoperitoneum, bowel perforation   Postoperative Diagnosis: Pneumoperitoneum, left colon perforation possible from fish bone?   Procedure(s) Performed: Partial colectomy, end colostomy, central line right internal jugular    Surgeon: Lanell Matar. Constance Haw, MD   Assistants: No qualified resident was available    Anesthesia: General endotracheal   Anesthesiologist: Dr. Charna Elizabeth   Specimens:  Descending colon suture proximal (mesentery with inflammation and staining, ? Microperforation); Splenic flexure suture proximal    Estimated Blood Loss: 300cc   Blood Replacement: None    Complications: None   Wound Class: Dirty/ Infected    Operative Indications:  Mr. Squillace is a 79 yo with multiple medical co-morbidities who presented with pneumoperitoneum of unknown origin. He as on Eliquis for a PE and was given Kcentra. We discussed the risk surgery including bleeding especially with the anticoagulation, infection, need for colostomy, finding something like cancer, and need for additional surgeries, wound packing, intubation, prolonged hospital stay and rehabilitation.  We discussed central line and arterial line placement due to concern for impending sepsis and shock.   Findings: Contamination and inflammation in the mesentery/ epiploic fat of descending colon, signs of staining in the area, murky feces stained fluid and local fecal peritonitis in the region; remain stomach, small intestine and colon without signs of perforation; overall patient oozing from anticoagulation    Procedure: The patient was taken to the operating room and placed supine. General endotracheal anesthesia was induced. Intravenous antibiotics were administered per protocol. Eppie Gibson was administered. A right internal jugular central line was placed.  The right chest and neck was prepped and draped in the usual sterile fashion.  Wearing full  gown and gloves, I performed the procedure.  One attempt at subclavian access was perform but the vessel was not found.  An ultrasound was utilized to assess the jugular vein.  The needle with syringe was advanced into the vein with dark venous return, and a wire was placed using the Seldinger technique without difficulty.  Ectopia was noted and the wire pulled back.  The skin was knicked and a dilator was placed, and the three lumen catheter was placed over the wire with continued control of the wire.  There was good draw back of blood from all three lumens and each flushed easily with saline.  The catheter was secured in 4 points with 2-0 silk and a biopatch and dressing was placed.   An arterial line was placed by anesthesia in the right radial artery in the standard fashion. Attempts were made in the left but aborted.   A JACHO approved time out was performed.   An orogastric tube positioned to decompress the stomach. A foley catheter was placed.  The abdomen was prepared and draped in the usual sterile fashion.  A midline incision was made and carried down through the fascia. Care as taken to enter the peritoneum with Metzenbaum scissors. Brown murky fluid was noted. The small bowel was ran and no abnormality was seen. The anterior stomach was palpated and the first portion of the duodenum and no bile or sign of perforation was noted. The patient's colon was ran and his cecum, transverse colon and sigmoid colon and rectum were all normal without signs of perforation. There was an area on the descending colon at the mesentery between epiploic fat that had staining brown and inflammation as if a perforation had been sealed. Omentum was also adherent in this area.  Care was taken  to mobilize the left colon along the white line of [place] with cautery to get a better look and ensure this was the sight of perforation. This was taken up to the splenic flexure and carried around the splenic flexure ligated the  splenocolic and gastrocolic ligaments with the Ligasure. The greater omentum was opened over the transverse colon and the greater sac was entered, the posterior stomach was without signs of perforation or contamination. The remaining splenic flexure was mobilized. During this care was taken to control bleeding, but there was significant oozing controlled with laparotomy pads.   From here I looked at the anterior and posterior stomach, the small bowel, and remaining colon three times to ensure I was not missing a perforation. The only signs of perforation were this contaminated area with brown staining and inflammation in the descending colon.  I could not see an obvious mucosal defect but there was thickening and I could palpate something hard. I could not tell if this was in the colon or mesentery. Given this I did not feel like I could leave the colon behind as this could be a ulcerating cancer.  With the splenic flexure mobilized.  The left ureter was identified and protected deep to this plane. The proximal and distal points of transection were taken with 75 mm linear cutting staplers. The mesentery was taken with a Ligaure. Suture marked proximal. I opened the specimen on the back table due to the palpable hardness but identified no obvious mucosa defect corresponding to the contamination, staining and inflammation of the mesentery. This was quite odd. The patient had reported eating Cat[employer] and potentially had a microperforation from a fish bone based on these findings.  Again I ran the entire bowel to ensure I was not missing any perforation or injury and I found nothing more.    Given the stool burden and left colon, I did not want to do an anastomosis.  A left sided end colostomy would be placed. The splenic flexure had been mobilized but the transverse colon had the most laxity and stretch to the abdominal wall. Given this another 10 cm of splenic flexure were taken with a 75 mm linear cutting stapler  and the mesentery was taken with the Ligasure. The distal transverse colon reached easily to the left mid abdomen for the ostomy creation.    The abdomen was irrigated. Hemostasis was confirmed. There was generalized oozing in the left gutter and splenic flexure and over the cut mesentery. Surgicel was placed in the areas and total of 3 sheets were used after cautery hemostasis and packing were removed.  This controlled hemostasis adequately.    A point in the left mid/ upper abdomen was picked and circular incision of skin was taken and the fat was removed. A cruciate incision was made over the rectus muscle fascia and the muscle fibers were dissected down. Two fingers fit through the fascial defect. The end colon was brought out under no tension and was pink and viable.  Some of the surround epiploic fat to be removed to get it through the fascia defect.  The end colon was secured with a babcock. No twist was noted.   The midline incision was closed in the standard fashion with 0 PDS suture. The wound was irrigated and made hemostatic. The incision was re-approximated in two areas to allow for three smaller wounds to pack in the midline. The midline wound was covered. The colon was opened and the colostomy was matured in the  standard fashion with 3-0 Chromic gut suture. There was some bleeding from the colostomy edges which was controlled. The ostomy was digitized and was patent past the fascia.  The ostomy appliance was placed. The midline was packed with saline dampened kerlix and ABD and paper-tape.  All counts were correct at the end of the case. The patient was transferred to the ICU in stable condition. He received 4L of fluid but had required no pressors.   [name], MD [facility] [address] [place], [place] [address] [phone] (office)

## 2020-06-21 NOTE — Progress Notes (Signed)
Pharmacy Antibiotic Note  Vincent Cooper is a 79 y.o. male admitted on 06/21/2020 with bowel perforation.  Pharmacy has been consulted for Zosyn dosing.  WBC 13.8, afebrile, Scr 1.19,, CrCl 64.7 ml/min (renal function stable)  Plan: Zosyn 3.375 gm IV Q 8 hrs (extended infusion) Monitor WBC, temp, clinical improvement, renal function  Height: 5\' 9"  (175.3 cm) Weight: 117.5 kg (259 lb) IBW/kg (Calculated) : 70.7  Temp (24hrs), Avg:98.3 F (36.8 C), Min:98.2 F (36.8 C), Max:98.3 F (36.8 C)  Recent Labs  Lab 06/17/20 1120 06/17/20 1133 06/18/20 0603 06/19/20 0629 06/20/20 0508 06/21/20 1240  WBC 9.7  --  8.7 9.4  --  13.8*  CREATININE  --  1.50* 1.16 1.21 1.08 1.19    Estimated Creatinine Clearance: 64.7 mL/min (by C-G formula based on SCr of 1.19 mg/dL).    Allergies  Allergen Reactions  . Other     Patient reports he was allergic to something in an IV he was given but does not know what the substance was. As of 01/19/2020     Antimicrobials this admission: 8/4 cefotetan X 1 8/4 Zosyn >> 8/4 fluconazole IV >>  Dose adjustments this admission: N/A  Microbiology results: 8/4 COVID: negative  Thank you for allowing pharmacy to be a part of this patient's care.  Gillermina Hu, PharmD, BCPS, [facility] Clinical Pharmacist 06/21/2020 7:57 PM

## 2020-06-21 NOTE — Progress Notes (Signed)
[facility]  Have talked to pharmacy and [facility]. We do have Kcentra and I have ordered it.   Will give this preop.  [name], MD [facility] [address] [place], [place] [address] [phone] (office)

## 2020-06-21 NOTE — Progress Notes (Signed)
ANTICOAGULATION CONSULT NOTE - Initial Consult  Pharmacy Consult for Kcentra Indication: Eliquis reversal  Allergies  Allergen Reactions  . Other     Patient reports he was allergic to something in an IV he was given but does not know what the substance was. As of 01/19/2020     Patient Measurements: Height: 5\' 9"  (175.3 cm) Weight: 117.5 kg (259 lb) IBW/kg (Calculated) : 70.7 Heparin Dosing Weight:    Vital Signs: Temp: 98.2 F (36.8 C) (08/04 1632) Temp Source: Oral (08/04 1632) BP: 113/81 (08/04 1730) Pulse Rate: 102 (08/04 1730)  Labs: Recent Labs    06/19/20 0629 06/20/20 0508 06/21/20 1240 06/21/20 1611  HGB 9.6*  --  10.6*  --   HCT 29.1*  --  32.9*  --   PLT 308  --  415*  --   CREATININE 1.21 1.08 1.19  --   TROPONINIHS  --   --  5 5    Estimated Creatinine Clearance: 64.7 mL/min (by C-G formula based on SCr of 1.19 mg/dL).   Medical History: Past Medical History:  Diagnosis Date  . Chronic gout   . COPD (chronic obstructive pulmonary disease) ([place])    Oxygen dependent  . 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 CAD-RADS 4 - Severe.  ? >50% LM stenosis -->CTFFR -- NOT PHYSIOLOGICALLY SIGNIFICANT; Large Dom RCA-<PDA/PLA.  Diffuse mild to mod plaque (25-49%) prox segment and mod (50-69%) mid-distal;  CTFFR: prox 0.95, mid 0.88, distal 0.86 (not significant); Med-sized LAD w/ long diffuse mod-severe plaque calcified plaque in prox-mid ~ 50 -69%, ? >70% -> CTFFR :   . Diabetes mellitus type II, non insulin dependent ([place])   . Hypertension   . Morbid obesity (HCC)    BMI of 38.5 with multiple risk factors.  . OSA on CPAP   . Pulmonary emboli ([place]) 10/2019   Chest CTA-4 Phadke opacification of main PA but there is partially occlusive main posterior RLL and segmental/segmental branches.  Small thrombus noted in the anterior right middle lobe.  No RV strain.  Scattered aortic  atherosclerosis involving great vessels.  Coronary calcification noted.  Marland Kitchen RLS (restless legs syndrome)     Assessment: CC/HPI: Abdominal pain, recent hospitalization for cellulitis, he was just discharged yesterday 8/3. Pt with bowel perforation  PMH: morbid obesity, COPD, diabetes mellitus type 2, hypertension, pulmonary embolism on anticoagulation with Eliquis 10/2019,CAD, OSA, gout, CAD, RLs  Anticoag:  h/o PE 10/2019 on Eliquis (LD 8/4 AM) . Reverse with Kcentra for emergency surgery 8/4 PM for Bowel perforation with peritonitis   Goal of Therapy:  Reversal of oral anticoagulation   Plan:  Kcentra 5000 units (500 unit boxes x 10 (568 each)(use 188ml) INR q6hrs x 4 Then INR daily x 2   Reneka Nebergall S. Alford Highland, PharmD, BCPS Clinical Staff Pharmacist [email or web address] Alford Highland, Justice Aguirre Stillinger 06/21/2020,7:33 PM

## 2020-06-21 NOTE — Anesthesia Preprocedure Evaluation (Addendum)
Anesthesia Evaluation  Patient identified by MRN, date of birth, ID band Patient awake    Reviewed: Allergy & Precautions, NPO status , Patient's Chart, lab work & pertinent test results  History of Anesthesia Complications Negative for: history of anesthetic complications  Airway Mallampati: III  TM Distance: >3 FB Neck ROM: Full    Dental  (+) Edentulous Upper, Edentulous Lower   Pulmonary shortness of breath, with exertion, lying and Long-Term Oxygen Therapy, sleep apnea, Continuous Positive Airway Pressure Ventilation and Oxygen sleep apnea , COPD,  COPD inhaler, former smoker, PE   breath sounds clear to auscultation + decreased breath sounds(-) wheezing      Cardiovascular hypertension, Pt. on medications + CAD  Normal cardiovascular exam+ Valvular Problems/Murmurs  Rhythm:Regular Rate:Tachycardia  1. Left ventricular ejection fraction, by visual estimation, is 60 to  65%. The left ventricle has normal function. There is no left ventricular  hypertrophy.  2. Left ventricular diastolic parameters are indeterminate.  3. The left ventricle has no regional wall motion abnormalities.  4. Global right ventricle has normal systolic function.The right  ventricular size is moderately enlarged. Right vetricular wall thickness  was not assessed.  5. Left atrial size was mildly dilated.  6. Right atrial size was normal.  7. Small pericardial effusion.  8. The mitral valve is normal in structure. No evidence of mitral valve  regurgitation.  9. The tricuspid valve is normal in structure. Tricuspid valve  regurgitation is mild.  10. The aortic valve has an indeterminant number of cusps. Aortic valve  regurgitation is not visualized. Mild aortic valve sclerosis without  stenosis.  11. The pulmonic valve was not well visualized. Pulmonic valve  regurgitation is trivial.  12. Moderately elevated pulmonary artery systolic  pressure.  13. The tricuspid regurgitant velocity is 2.84 m/s, and with an assumed  right atrial pressure of 8 mmHg, the estimated right ventricular systolic  pressure is moderately elevated at 40.3 mmHg.   21-Jun-2020 14:11:16 [facility]-AP-ER ROUTINE RECORD Sinus tachycardia Low voltage, precordial leads No significant change since prior 7/21 Confirmed by Aletta Edouard [phone]) on 06/21/2020 2:39:14 PM    Neuro/Psych  Neuromuscular disease (RLS) negative psych ROS   GI/Hepatic   Endo/Other  diabetes, Well Controlled, Type 2, Oral Hypoglycemic AgentsMorbid obesity  Renal/GU Renal InsufficiencyRenal disease     Musculoskeletal   Abdominal   Peds  Hematology   Anesthesia Other Findings   Reproductive/Obstetrics                            Anesthesia Physical Anesthesia Plan  ASA: IV and emergent  Anesthesia Plan: General   Post-op Pain Management:    Induction: Intravenous, Rapid sequence and Cricoid pressure planned  PONV Risk Score and Plan: 4 or greater and Ondansetron, Dexamethasone and Midazolam  Airway Management Planned: Oral ETT  Additional Equipment: Arterial line  Intra-op Plan:   Post-operative Plan: Post-operative intubation/ventilation  Informed Consent: I have reviewed the patients History and Physical, chart, labs and discussed the procedure including the risks, benefits and alternatives for the proposed anesthesia with the patient or authorized representative who has indicated his/her understanding and acceptance.       Plan Discussed with: Surgeon  Anesthesia Plan Comments:        Anesthesia Quick Evaluation

## 2020-06-21 NOTE — Progress Notes (Addendum)
[facility]  Notified family of case and colostomy. Notified of no obvious cause of perforation of the colon but possible perforation from a fish bone? Given his meal last night and onset of pain 5AM.   Patient stable in OR. Propofol ordered, PRN morphine Intubated overnight CXR to confirm ET tube and CVL Hd have been stable, no pressors required, arterial line in place Has been on steroid taper and received stress dose in OR, could need additional steroid dosing post op, will monitor  NPO, NG in place Zosyn for bowel perforation, d/c the diflucan Labs in Am NS IVF, Foley in place SCDs, holding anticoagulation, overall oozing in the surgery, received Kcentra preop  [name], MD [facility] [address] [place], [place] [address] [phone] (office)

## 2020-06-21 NOTE — H&P (Signed)
TRH H&P   Patient Demographics:    Vincent Cooper, is a 79 y.o. male  MRN: [id number]   DOB - 02-Mar-1941  Admit Date - 06/21/2020  Outpatient Primary MD for the patient is [name], DO  Referring MD/NP/PA: PA [place].  Patient coming from: Home  Chief Complaint  Patient presents with  . Abdominal Pain      HPI:    Vincent Cooper  is a 79 y.o. male, with history of morbid obesity, COPD, diabetes mellitus type 2, hypertension, pulmonary embolism on anticoagulation with Eliquis , CAD, OSA, patient with recent hospitalization for cellulitis, he was just discharged yesterday, patient presents to ED secondary to complaints of sudden onset abdominal pain, patient reports patient started suddenly this morning around 530, reports severe pain woke him up from sleep, reports it is the left lower quadrant, stabbing quality, ports currently improved after he received some morphine, denies any history of such pain in the past, patient denies any history of diverticulitis, reports last colonoscopy was 10 years ago, and he was not told anything normal about it, he denies any vomiting, fever, chills, diarrhea, coffee-ground emesis.  Patient was discharged on prednisone and antibiotics for his cellulitis. - in ED CT abdomen and pelvis was significant for free air in the abdomen, significant for bowel perforation, he had leukocytosis of, he was started empirically on Zosyn, neurosurgery was consulted, plan to go for emergent expiratory laparotomy, triage hospitalist were consulted.   Review of systems:    In addition to the HPI above,  No Fever-chills, No Headache, No changes with Vision or hearing, No problems swallowing food or Liquids, No Chest pain, Cough he reports his dyspnea at baseline Complains of significant abdominal pain, nausea, no vomiting . No Blood in stool or Urine, No dysuria, No new  skin rashes or bruises, No new joints pains-aches,  No new weakness, tingling, numbness in any extremity, No recent weight gain or loss, No polyuria, polydypsia or polyphagia, No significant Mental Stressors.  A full 10 point Review of Systems was done, except as stated above, all other Review of Systems were negative.   With Past History of the following :    Past Medical History:  Diagnosis Date  . Chronic gout   . COPD (chronic obstructive pulmonary disease) ([place])    Oxygen dependent  . 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 CAD-RADS 4 - Severe.  ? >50% LM stenosis -->CTFFR -- NOT PHYSIOLOGICALLY SIGNIFICANT; Large Dom RCA-<PDA/PLA.  Diffuse mild to mod plaque (25-49%) prox segment and mod (50-69%) mid-distal;  CTFFR: prox 0.95, mid 0.88, distal 0.86 (not significant); Med-sized LAD w/ long diffuse mod-severe plaque calcified plaque in prox-mid ~ 50 -69%, ? >70% -> CTFFR :   . Diabetes mellitus type II, non insulin dependent ([place])   . Hypertension   .  Morbid obesity (HCC)    BMI of 38.5 with multiple risk factors.  . OSA on CPAP   . Pulmonary emboli ([place]) 10/2019   Chest CTA-4 Phadke opacification of main PA but there is partially occlusive main posterior RLL and segmental/segmental branches.  Small thrombus noted in the anterior right middle lobe.  No RV strain.  Scattered aortic atherosclerosis involving great vessels.  Coronary calcification noted.  Marland Kitchen RLS (restless legs syndrome)       Past Surgical History:  Procedure Laterality Date  . Cataract surgery Right       Social History:     Social History   Tobacco Use  . Smoking status: Former Research scientist (life sciences)  . Smokeless tobacco: Former Network engineer Use Topics  . Alcohol use: Not Currently       Family History :     Family History  Problem Relation Age of Onset  . Colon cancer Mother   . CAD Neg Hx   . Diabetes Neg Hx      Home  Medications:   Prior to Admission medications   Medication Sig Start Date End Date Taking? Authorizing Provider  albuterol (VENTOLIN HFA) 108 (90 Base) MCG/ACT inhaler Inhale 1 puff into the lungs every 6 (six) hours as needed for wheezing or shortness of breath.    [provider]  apixaban (ELIQUIS) 5 MG TABS tablet Take 1 tablet (5 mg total) by mouth 2 (two) times daily. 01/21/20   Barton Dubois, MD  cefdinir (OMNICEF) 300 MG capsule Take 1 capsule (300 mg total) by mouth every 12 (twelve) hours. 06/20/20   Orson Eva, MD  diltiazem (CARDIZEM CD) 180 MG 24 hr capsule Take 2 capsules (360 mg total) by mouth daily. Patient taking differently: Take 360 mg by mouth at bedtime.  03/15/20   Leonie Man, MD  doxycycline (VIBRAMYCIN) 100 MG capsule Take 100 mg by mouth 2 (two) times daily. For 10 days    [provider]  glimepiride (AMARYL) 2 MG tablet Take 2 mg by mouth daily with breakfast.    [provider]  guaiFENesin (MUCINEX) 600 MG 12 hr tablet Take 1 tablet (600 mg total) by mouth 2 (two) times daily. 01/21/20   Barton Dubois, MD  lisinopril (ZESTRIL) 40 MG tablet Take 0.5 tablets (20 mg total) by mouth daily. Patient taking differently: Take 20 mg by mouth at bedtime.  03/15/20   Leonie Man, MD  metFORMIN (GLUCOPHAGE) 500 MG tablet Take 500 mg by mouth 2 (two) times daily with a meal.    [provider]  nitroGLYCERIN (NITROSTAT) 0.4 MG SL tablet Place 1 tablet (0.4 mg total) under the tongue every 5 (five) minutes as needed for chest pain. 01/19/20 06/17/20  Leonie Man, MD  predniSONE (DELTASONE) 10 MG tablet Take 6 tablets (60 mg total) by mouth daily with breakfast. And decrease by one tablet daily 06/21/20   Tat, Shanon Brow, MD  rosuvastatin (CRESTOR) 40 MG tablet Take 1 tablet (40 mg total) by mouth daily. 03/20/20 06/18/20  Leonie Man, MD  torsemide (DEMADEX) 20 MG tablet Take 20 mg by mouth 2 (two) times daily. 05/19/20   [provider]   TRELEGY ELLIPTA 100-62.5-25 MCG/INH AEPB Inhale 1 puff into the lungs daily. 05/12/20   [provider]  triamcinolone cream (KENALOG) 0.1 % Apply 1 application topically 2 (two) times daily. 06/13/20   [provider]     Allergies:     Allergies  Allergen Reactions  .  Other     Patient reports he was allergic to something in an IV he was given but does not know what the substance was. As of 01/19/2020      Physical Exam:   Vitals  Blood pressure 113/81, pulse (!) 102, temperature 98.2 F (36.8 C), temperature source Oral, resp. rate (!) 24, height 5\' 9"  (1.753 m), weight 117.5 kg, SpO2 94 %.   1. General Beese male, laying in bed in mild discomfort  2. Normal affect and insight, Not Suicidal or Homicidal, Awake Alert, Oriented X 3.  3. No F.N deficits, ALL C.Nerves Intact, Strength 5/5 all 4 extremities, Sensation intact all 4 extremities, Plantars down going.  4. Ears and Eyes appear Normal, Conjunctivae clear, PERRLA. Moist Oral Mucosa.  5. Supple Neck, No JVD, No cervical lymphadenopathy appriciated, No Carotid Bruits.  6. Symmetrical Chest wall movement, Good air movement bilaterally, no wheezing  7. RRR, No Gallops, Rubs or Murmurs, No Parasternal Heave.  +2 edema.  8.  Diminished bowel sounds, abdomen tender, with rebound and guarding, .  9.  No Cyanosis, Normal Skin Turgor, No Skin Rash or Bruise.  10. Good muscle tone,  joints appear normal , no effusions, Normal ROM.  11. No Palpable Lymph Nodes in Neck or Axillae     Data Review:    CBC Recent Labs  Lab 06/17/20 1120 06/18/20 0603 06/19/20 0629 06/21/20 1240  WBC 9.7 8.7 9.4 13.8*  HGB 10.2* 10.1* 9.6* 10.6*  HCT 30.2* 30.8* 29.1* 32.9*  PLT 345 353 308 415*  MCV 97.1 100.0 101.4* 102.2*  MCH 32.8 32.8 33.4 32.9  MCHC 33.8 32.8 33.0 32.2  RDW 17.1* 17.7* 17.8* 17.8*    ------------------------------------------------------------------------------------------------------------------  Chemistries  Recent Labs  Lab 06/17/20 1133 06/18/20 0603 06/19/20 0629 06/20/20 0508 06/21/20 1240  NA 136 137 137 132* 137  K 2.2* 2.7* 3.5 4.0 3.8  CL 90* 96* 101 99 99  CO2 32 28 28 23 25   GLUCOSE 204* 105* 145* 275* 202*  BUN 19 16 14 21  37*  CREATININE 1.50* 1.16 1.21 1.08 1.19  CALCIUM 8.4* 8.5* 8.0* 8.1* 8.4*  MG  --  2.0  --   --   --   AST 26  --   --   --  18  ALT 30  --   --   --  24  ALKPHOS 57  --   --   --  48  BILITOT 1.0  --   --   --  1.0   ------------------------------------------------------------------------------------------------------------------ estimated creatinine clearance is 64.7 mL/min (by C-G formula based on SCr of 1.19 mg/dL). ------------------------------------------------------------------------------------------------------------------ No results for input(s): TSH, T4TOTAL, T3FREE, THYROIDAB in the last 72 hours.  Invalid input(s): FREET3  Coagulation profile No results for input(s): INR, PROTIME in the last 168 hours. ------------------------------------------------------------------------------------------------------------------- No results for input(s): DDIMER in the last 72 hours. -------------------------------------------------------------------------------------------------------------------  Cardiac Enzymes No results for input(s): CKMB, TROPONINI, MYOGLOBIN in the last 168 hours.  Invalid input(s): CK ------------------------------------------------------------------------------------------------------------------    Component Value Date/Time   BNP 87.0 06/17/2020 1134     ---------------------------------------------------------------------------------------------------------------  Urinalysis    Component Value Date/Time   COLORURINE YELLOW 06/21/2020 1229   APPEARANCEUR HAZY (A) 06/21/2020 1229    LABSPEC 1.014 06/21/2020 1229   PHURINE 5.0 06/21/2020 1229   GLUCOSEU 50 (A) 06/21/2020 1229   HGBUR MODERATE (A) 06/21/2020 1229   BILIRUBINUR NEGATIVE 06/21/2020 1229   KETONESUR 5 (A) 06/21/2020 1229   PROTEINUR 30 (A) 06/21/2020 1229  NITRITE NEGATIVE 06/21/2020 1229   LEUKOCYTESUR NEGATIVE 06/21/2020 1229    ----------------------------------------------------------------------------------------------------------------   Imaging Results:    CT ABDOMEN PELVIS W CONTRAST  Result Date: 06/21/2020 CLINICAL DATA:  Acute left lower quadrant abdominal pain. EXAM: CT ABDOMEN AND PELVIS WITH CONTRAST TECHNIQUE: Multidetector CT imaging of the abdomen and pelvis was performed using the standard protocol following bolus administration of intravenous contrast. CONTRAST:  186mL OMNIPAQUE IOHEXOL 300 MG/ML  SOLN COMPARISON:  None. FINDINGS: Lower chest: No acute abnormality. Hepatobiliary: Solitary gallstone is noted. No biliary dilatation is noted. The liver is unremarkable. Pancreas: Unremarkable. No pancreatic ductal dilatation or surrounding inflammatory changes. Spleen: Normal in size without focal abnormality. Adrenals/Urinary Tract: Adrenal glands are unremarkable. Kidneys are normal, without renal calculi, focal lesion, or hydronephrosis. Bladder is unremarkable. Stomach/Bowel: Moderate amount of free air is noted in the peritoneal space, particularly in the epigastric region. This is concerning for rupture of hollow viscus. However the source is not clearly identified. The stomach and appendix are unremarkable. There is no evidence of bowel obstruction or inflammation. Sigmoid diverticulosis is noted. Vascular/Lymphatic: Aortic atherosclerosis. No enlarged abdominal or pelvic lymph nodes. Reproductive: Prostate is unremarkable. Other: No abdominal wall hernia or abnormality. No abdominopelvic ascites. Musculoskeletal: No acute or significant osseous findings. IMPRESSION: 1. Moderate amount of  free air is noted in the peritoneal space, particularly in the epigastric region. This is concerning for rupture of hollow viscus. However the source is not clearly identified. Critical Value/emergent results were called by telephone at the time of interpretation on 06/21/2020 at 3:45 pm to provider ABIGAIL HARRIS , who verbally acknowledged these results. 2. Sigmoid diverticulosis without inflammation. 3. Solitary gallstone. Aortic Atherosclerosis (ICD10-I70.0). Electronically Signed   By: Marijo Conception M.D.   On: 06/21/2020 15:47   DG Chest Port 1 View  Result Date: 06/21/2020 CLINICAL DATA:  Shortness of breath.  History of COPD. EXAM: PORTABLE CHEST 1 VIEW COMPARISON:  Single-view of the chest 06/17/2020 and 11/13/2019. CT chest 11/06/2019. FINDINGS: The lungs are emphysematous. Mild subsegmental atelectasis is seen in the lung bases. The lungs are otherwise clear. Heart size is normal. Aortic atherosclerosis. No pneumothorax or pleural fluid. No acute bony abnormality. IMPRESSION: No acute disease. Aortic Atherosclerosis (ICD10-I70.0) and Emphysema (ICD10-J43.9). Electronically Signed   By: Inge Rise M.D.   On: 06/21/2020 13:58    My personal review of EKG: Rhythm NSR, Rate  100 /min, QTc 449   Assessment & Plan:    Principal Problem:   Bowel perforation (HCC) Active Problems:   COPD (chronic obstructive pulmonary disease) (HCC)   Diabetes mellitus type II, non insulin dependent (HCC)   OSA and COPD overlap syndrome (HCC)   Essential hypertension   Morbid obesity (HCC)   Chronic diastolic HF (heart failure) (HCC)   Coronary artery disease, non-occlusive   Hyperlipidemia with target LDL less than 70   Free intraperitoneal air   Bowel perforation with peritonitis -Presents with sudden onset of abdominal pain, CT abdomen pelvis in ED significant for moderate air, consistent with bowel perforation, with peritonitis given significant rebound and guarding. -General surgery consult  greatly appreciated, patient will need emergent surgical intervention, plan to go to surgery soon as discussed with general surgery. -We will keep n.p.o., on IV fluids, will hold his Eliquis, and will start empirically on Zosyn and Diflucan.  COPD -Clear with no active wheezing, but he is with 2 L oxygen requirement currently in ED, patient at high risk for prolonged intubation after surgery as discussed  with wife,. -Agement include scheduled duo nebs every 4 hours, intubation as soon as medically stable, incentive spirometry after procedure.   Chronic diastolic CHF -Patient appears to have some lower extremity edema, mildly volume overload, will continue with IV fluids for now, will hold on diuresis, and this can be reassessed after surgery  History of DVT/PE -He took his apixaban this morning, will resume on anticoagulation(Lovenox versus heparin GTT) once cleared by general surgery.  Hyperlipidemia -Resume statin when stable  Hypertension -Blood pressure currently acceptable, hold home medications for now, will resume on as needed postop if blood pressure is elevated.  Diabetes mellitus type 2 -Recent A1c is 5.4, hold Metformin, will keep on sliding scale during hospital stay  Nonocclusive CAD -He denies any chest pain currently. -No acute changes in EKG  Morbid obesity -Lifestyle modification -BMI 41.75  OSA -Patient is poorly compliant with CPAP at home     DVT Prophylaxis : Eliquis on Hold  AM Labs Ordered, also please review Full Orders  Family Communication: Admission, patients condition and plan of care including tests being ordered have been discussed with the patient and wife at bedside who indicate understanding and agree with the plan and Code Status.  Code Status Full  Likely DC to  : pending, but likely will need placement  Condition GUARDED    Consults called: General surgery   Admission status: inpatient   Time spent in minutes : 60  minutes   Phillips Climes M.D on 06/21/2020 at 6:35 PM   [facility] - Office  [phone]

## 2020-06-22 ENCOUNTER — Encounter (HOSPITAL_COMMUNITY): Payer: Self-pay | Admitting: General Surgery

## 2020-06-22 DIAGNOSIS — A419 Sepsis, unspecified organism: Principal | ICD-10-CM

## 2020-06-22 DIAGNOSIS — K631 Perforation of intestine (nontraumatic): Secondary | ICD-10-CM

## 2020-06-22 DIAGNOSIS — R6521 Severe sepsis with septic shock: Secondary | ICD-10-CM

## 2020-06-22 DIAGNOSIS — J9601 Acute respiratory failure with hypoxia: Secondary | ICD-10-CM

## 2020-06-22 LAB — BLOOD GAS, ARTERIAL
Acid-base deficit: 0.6 mmol/L (ref 0.0–2.0)
Drawn by: 38235
FIO2: 60
MECHVT: 560 mL
O2 Saturation: >100 %
PEEP: 5 cmH2O
Patient temperature: 37
RATE: 15 resp/min
pCO2 arterial: 36.4 mmHg (ref 32.0–48.0)
pH, Arterial: 7.421 (ref 7.350–7.450)
pO2, Arterial: 232 mmHg — ABNORMAL HIGH (ref 83.0–108.0)

## 2020-06-22 LAB — CBC WITH DIFFERENTIAL/PLATELET
Band Neutrophils: 26 %
Basophils Absolute: 0 10*3/uL (ref 0.0–0.1)
Basophils Relative: 0 %
Eosinophils Absolute: 0 10*3/uL (ref 0.0–0.5)
Eosinophils Relative: 0 %
HCT: 23 % — ABNORMAL LOW (ref 39.0–52.0)
Hemoglobin: 7.3 g/dL — ABNORMAL LOW (ref 13.0–17.0)
Lymphocytes Relative: 17 %
Lymphs Abs: 1.6 10*3/uL (ref 0.7–4.0)
MCH: 33.3 pg (ref 26.0–34.0)
MCHC: 31.7 g/dL (ref 30.0–36.0)
MCV: 105 fL — ABNORMAL HIGH (ref 80.0–100.0)
Metamyelocytes Relative: 4 %
Monocytes Absolute: 1.1 10*3/uL — ABNORMAL HIGH (ref 0.1–1.0)
Monocytes Relative: 12 %
Neutro Abs: 6.2 10*3/uL (ref 1.7–7.7)
Neutrophils Relative %: 41 %
RBC: 2.19 MIL/uL — ABNORMAL LOW (ref 4.22–5.81)
RDW: 18.3 % — ABNORMAL HIGH (ref 11.5–15.5)
WBC: 9.2 10*3/uL (ref 4.0–10.5)
nRBC: 1 /100 WBC — ABNORMAL HIGH

## 2020-06-22 LAB — GLUCOSE, CAPILLARY
Glucose-Capillary: 201 mg/dL — ABNORMAL HIGH (ref 70–99)
Glucose-Capillary: 225 mg/dL — ABNORMAL HIGH (ref 70–99)
Glucose-Capillary: 224 mg/dL — ABNORMAL HIGH (ref 70–99)
Glucose-Capillary: 249 mg/dL — ABNORMAL HIGH (ref 70–99)

## 2020-06-22 LAB — BASIC METABOLIC PANEL
Anion gap: 11 (ref 5–15)
BUN: 37 mg/dL — ABNORMAL HIGH (ref 8–23)
CO2: 20 mmol/L — ABNORMAL LOW (ref 22–32)
Calcium: 7.3 mg/dL — ABNORMAL LOW (ref 8.9–10.3)
Chloride: 102 mmol/L (ref 98–111)
Creatinine, Ser: 1.69 mg/dL — ABNORMAL HIGH (ref 0.61–1.24)
GFR calc Af Amer: 44 mL/min — ABNORMAL LOW (ref >60)
GFR calc non Af Amer: 38 mL/min — ABNORMAL LOW (ref >60)
Glucose, Bld: 243 mg/dL — ABNORMAL HIGH (ref 70–99)
Potassium: 4.3 mmol/L (ref 3.5–5.1)
Sodium: 133 mmol/L — ABNORMAL LOW (ref 135–145)

## 2020-06-22 LAB — TRIGLYCERIDES: Triglycerides: 151 mg/dL — ABNORMAL HIGH (ref <150)

## 2020-06-22 LAB — COMPREHENSIVE METABOLIC PANEL
ALT: 21 U/L (ref 0–44)
AST: 18 U/L (ref 15–41)
Albumin: 2 g/dL — ABNORMAL LOW (ref 3.5–5.0)
Alkaline Phosphatase: 27 U/L — ABNORMAL LOW (ref 38–126)
Anion gap: 11 (ref 5–15)
BUN: 31 mg/dL — ABNORMAL HIGH (ref 8–23)
CO2: 24 mmol/L (ref 22–32)
Calcium: 7.4 mg/dL — ABNORMAL LOW (ref 8.9–10.3)
Chloride: 102 mmol/L (ref 98–111)
Creatinine, Ser: 1.4 mg/dL — ABNORMAL HIGH (ref 0.61–1.24)
GFR calc Af Amer: 55 mL/min — ABNORMAL LOW (ref >60)
GFR calc non Af Amer: 48 mL/min — ABNORMAL LOW (ref >60)
Glucose, Bld: 221 mg/dL — ABNORMAL HIGH (ref 70–99)
Potassium: 4.2 mmol/L (ref 3.5–5.1)
Sodium: 137 mmol/L (ref 135–145)
Total Bilirubin: 0.8 mg/dL (ref 0.3–1.2)
Total Protein: 4.1 g/dL — ABNORMAL LOW (ref 6.5–8.1)

## 2020-06-22 LAB — HEMOGLOBIN AND HEMATOCRIT, BLOOD
HCT: 30.5 % — ABNORMAL LOW (ref 39.0–52.0)
Hemoglobin: 9.6 g/dL — ABNORMAL LOW (ref 13.0–17.0)

## 2020-06-22 LAB — MRSA PCR SCREENING: MRSA by PCR: NEGATIVE

## 2020-06-22 LAB — LACTIC ACID, PLASMA
Lactic Acid, Venous: 2.3 mmol/L (ref 0.5–1.9)
Lactic Acid, Venous: 1.8 mmol/L (ref 0.5–1.9)

## 2020-06-22 LAB — MAGNESIUM: Magnesium: 1.6 mg/dL — ABNORMAL LOW (ref 1.7–2.4)

## 2020-06-22 LAB — PHOSPHORUS: Phosphorus: 4 mg/dL (ref 2.5–4.6)

## 2020-06-22 MED ORDER — FENTANYL 2500MCG IN NS 250ML (10MCG/ML) PREMIX INFUSION
25.0000 ug/h | INTRAVENOUS | Status: DC
  Administered 2020-06-22: 200 ug/h via INTRAVENOUS
  Administered 2020-06-22: 100 ug/h via INTRAVENOUS
  Filled 2020-06-22 (×2): qty 250

## 2020-06-22 MED ORDER — ORAL CARE MOUTH RINSE
15.0000 mL | OROMUCOSAL | Status: DC
  Administered 2020-06-22 – 2020-06-23 (×10): 15 mL via OROMUCOSAL

## 2020-06-22 MED ORDER — AMIODARONE IV BOLUS ONLY 150 MG/100ML
INTRAVENOUS | Status: AC
  Filled 2020-06-22: qty 100

## 2020-06-22 MED ORDER — INSULIN ASPART 100 UNIT/ML ~~LOC~~ SOLN: 0.0000 [IU] | Freq: Every day | SUBCUTANEOUS | Status: DC

## 2020-06-22 MED ORDER — VASOPRESSIN 20 UNITS/100 ML INFUSION FOR SHOCK
0.0000 [IU]/min | INTRAVENOUS | Status: DC
  Administered 2020-06-22 (×2): 0.03 [IU]/min via INTRAVENOUS
  Filled 2020-06-22 (×2): qty 100

## 2020-06-22 MED ORDER — NOREPINEPHRINE 4 MG/250ML-% IV SOLN
0.0000 ug/min | INTRAVENOUS | Status: DC
  Administered 2020-06-22: 30 ug/min via INTRAVENOUS
  Administered 2020-06-22: 35 ug/min via INTRAVENOUS
  Filled 2020-06-22 (×3): qty 250

## 2020-06-22 MED ORDER — NOREPINEPHRINE 16 MG/250ML-% IV SOLN
0.0000 ug/min | INTRAVENOUS | Status: DC
  Administered 2020-06-22: 30 ug/min via INTRAVENOUS
  Administered 2020-06-23: 20 ug/min via INTRAVENOUS
  Filled 2020-06-22 (×2): qty 250

## 2020-06-22 MED ORDER — AMIODARONE IV BOLUS ONLY 150 MG/100ML
150.0000 mg | Freq: Once | INTRAVENOUS | Status: AC
  Administered 2020-06-22: 150 mg via INTRAVENOUS
  Filled 2020-06-22: qty 100

## 2020-06-22 MED ORDER — AMIODARONE HCL IN DEXTROSE 360-4.14 MG/200ML-% IV SOLN
30.0000 mg/h | INTRAVENOUS | Status: DC
  Administered 2020-06-22 – 2020-06-28 (×11): 30 mg/h via INTRAVENOUS
  Filled 2020-06-22 (×12): qty 200

## 2020-06-22 MED ORDER — AMIODARONE HCL IN DEXTROSE 360-4.14 MG/200ML-% IV SOLN
INTRAVENOUS | Status: AC
  Administered 2020-06-22: 60 mg/h via INTRAVENOUS
  Filled 2020-06-22: qty 200

## 2020-06-22 MED ORDER — AMIODARONE HCL IN DEXTROSE 360-4.14 MG/200ML-% IV SOLN: 60.0000 mg/h | INTRAVENOUS | Status: AC

## 2020-06-22 MED ORDER — INSULIN DETEMIR 100 UNIT/ML ~~LOC~~ SOLN
10.0000 [IU] | Freq: Two times a day (BID) | SUBCUTANEOUS | Status: DC
  Administered 2020-06-22 – 2020-06-23 (×3): 10 [IU] via SUBCUTANEOUS
  Filled 2020-06-22 (×10): qty 0.1

## 2020-06-22 MED ORDER — VASOPRESSIN 20 UNIT/ML IV SOLN
INTRAVENOUS | Status: AC
  Filled 2020-06-22: qty 1

## 2020-06-22 MED ORDER — FENTANYL CITRATE (PF) 100 MCG/2ML IJ SOLN
25.0000 ug | Freq: Once | INTRAMUSCULAR | Status: AC
  Administered 2020-06-22: 25 ug via INTRAVENOUS

## 2020-06-22 MED ORDER — LACTATED RINGERS IV BOLUS
1000.0000 mL | Freq: Once | INTRAVENOUS | Status: AC
  Administered 2020-06-22: 1000 mL via INTRAVENOUS

## 2020-06-22 MED ORDER — INSULIN ASPART 100 UNIT/ML ~~LOC~~ SOLN: 0.0000 [IU] | Freq: Three times a day (TID) | SUBCUTANEOUS | Status: DC

## 2020-06-22 MED ORDER — FENTANYL BOLUS VIA INFUSION
25.0000 ug | INTRAVENOUS | Status: DC | PRN
  Filled 2020-06-22: qty 25

## 2020-06-22 MED ORDER — NOREPINEPHRINE 4 MG/250ML-% IV SOLN
INTRAVENOUS | Status: AC
  Filled 2020-06-22: qty 250

## 2020-06-22 MED ORDER — VASOPRESSIN 20 UNITS/100 ML INFUSION FOR SHOCK: 0.0000 [IU]/min | INTRAVENOUS | Status: DC

## 2020-06-22 MED ORDER — SODIUM CHLORIDE 0.9% IV SOLUTION: Freq: Once | INTRAVENOUS | Status: AC

## 2020-06-22 MED ORDER — CHLORHEXIDINE GLUCONATE CLOTH 2 % EX PADS
6.0000 | MEDICATED_PAD | Freq: Every day | CUTANEOUS | Status: DC
  Administered 2020-06-22 – 2020-06-29 (×7): 6 via TOPICAL

## 2020-06-22 MED ORDER — NOREPINEPHRINE 4 MG/250ML-% IV SOLN
INTRAVENOUS | Status: AC
  Administered 2020-06-22: 12 mg
  Filled 2020-06-22: qty 250

## 2020-06-22 MED ORDER — PHENYLEPHRINE HCL-NACL 10-0.9 MG/250ML-% IV SOLN
0.0000 ug/min | INTRAVENOUS | Status: DC
  Administered 2020-06-22: 20 ug/min via INTRAVENOUS
  Filled 2020-06-22 (×2): qty 250

## 2020-06-22 MED ORDER — MAGNESIUM SULFATE 2 GM/50ML IV SOLN
2.0000 g | Freq: Once | INTRAVENOUS | Status: AC
  Administered 2020-06-22: 2 g via INTRAVENOUS
  Filled 2020-06-22: qty 50

## 2020-06-22 MED ORDER — LEVALBUTEROL HCL 0.63 MG/3ML IN NEBU
0.6300 mg | INHALATION_SOLUTION | Freq: Four times a day (QID) | RESPIRATORY_TRACT | Status: DC
  Administered 2020-06-22 – 2020-06-24 (×9): 0.63 mg via RESPIRATORY_TRACT
  Filled 2020-06-22 (×9): qty 3

## 2020-06-22 MED ORDER — CHLORHEXIDINE GLUCONATE 0.12% ORAL RINSE (MEDLINE KIT)
15.0000 mL | Freq: Two times a day (BID) | OROMUCOSAL | Status: DC
  Administered 2020-06-22 – 2020-06-23 (×2): 15 mL via OROMUCOSAL

## 2020-06-22 MED ORDER — PANTOPRAZOLE SODIUM 40 MG IV SOLR
40.0000 mg | Freq: Two times a day (BID) | INTRAVENOUS | Status: DC
  Administered 2020-06-22 – 2020-06-29 (×15): 40 mg via INTRAVENOUS
  Filled 2020-06-22 (×15): qty 40

## 2020-06-22 MED ORDER — HYDROCORTISONE NA SUCCINATE PF 100 MG IJ SOLR
50.0000 mg | Freq: Three times a day (TID) | INTRAMUSCULAR | Status: DC
  Administered 2020-06-22 – 2020-06-23 (×3): 50 mg via INTRAVENOUS
  Filled 2020-06-22 (×3): qty 2

## 2020-06-22 NOTE — Progress Notes (Signed)
eLink Physician-Brief Progress Note Patient Name: Vincent Cooper DOB: 08/19/1941 MRN: [id number]   Date of Service  06/22/2020  HPI/Events of Note  79 year old man with obesity. DM, HTN, DVT/PE on eliquis , now in ICU sp OR after bowel surgery. Intubated and sedated in ICU and in no distress. Mild tachycardia on vitals otherwise stable on PRVC VT 560, RR 15, peep 5, O2 60  eICU Interventions  On sedation Continue vent support ABG planned, titrate based on ABG Pain control Antibiotics and fluids already on  Defer anticoagulation to surgical team - was on eliquis before coming in Has AM labs ordered Bedside RN did not request interventions from E link Please call us if needed     Intervention Category Major Interventions: Respiratory failure - evaluation and management;Sepsis - evaluation and management Evaluation Type: New Patient Evaluation  Margaretmary Lombard 06/22/2020, 12:31 AM

## 2020-06-22 NOTE — Progress Notes (Signed)
Patient Demographics:    Vincent Cooper, is a 79 y.o. male, DOB - [birth year], QIH:[id number]  Admit date - 06/21/2020   Admitting Physician Albertine Patricia, MD  Outpatient Primary MD for the patient is [name], DO  LOS - 1   Chief Complaint  Patient presents with  . Abdominal Pain        Subjective:    [name] today is intubated and sedated --- Requiring Levophed at 61 -Daughter at bedside  Assessment  & Plan :    Principal Problem:   Bowel perforation (HCC) Active Problems:   COPD (chronic obstructive pulmonary disease) (HCC)   Diabetes mellitus type II, non insulin dependent (HCC)   OSA and COPD overlap syndrome (HCC)   Essential hypertension   Morbid obesity (HCC)   Chronic diastolic HF (heart failure) (HCC)   Coronary artery disease, non-occlusive   Hyperlipidemia with target LDL less than 70   Free intraperitoneal air   Brief Summary:- 79 year old obese man with dm2, htn, cad, COPD, H/o PE on Apixaban admitted on 06/21/2020 with abdominal pain and found to have colonic perforation due to fishbone impaction and underwent partial colectomy, end colostomy emergently on 06/21/2020, postop patient had persistent hypotension most likely due to severe sepsis with septic shock  A/p 1)Severe sepsis with septic shock--suspect bowel perforation/fecal peritonitis peritonitis related -Hypotension persisted on Neo-Synephrine -Patient with persistent hypotension despite IV fluid boluses and transfusion of PRBCs --Levophed currently at 35 mics, vasopressin added- -lactic acid is 2.1 -Intubated and currently sedated and ventilated due to acute hypoxic respiratory failure and fall airway protection postoperatively  -Stress dose of steroids added -Continue IV Zosyn  2)New onset A. fib with RVR --tachyarrhythmia in the setting of severe sepsis and septic shock--- did not respond to IV metoprolol,  started on IV amiodarone  3) history of PE--- diagnosed in December 2020 PTA was on Eliquis currently on hold perioperatively, restart heparin plan general surgeon -Received Kcentra preop  4)DM2- Use Novolog/Humalog Sliding scale insulin with Accu-Cheks/Fingersticks as ordered -MMA added   5)Obesity/OSA--- history of poor compliance PTA, currently intubated and sedated  6) acute symptomatic anemia due to acute blood loss---  hemoglobin up to 9.6 from 7.3 after transfusion of 1 unit of PRBC keep hemoglobin > 8   7)AKI----acute kidney injury due to reduced renal perfusion in the setting of persistent hypotension in the setting of severe sepsis with septic shock compounded by lisinopril, torsemide and Metformin use PTA -   creatinine on admission= 1.0 , baseline creatinine = 1.0   , creatinine is now= 1.69 ,  renally adjust medications, avoid nephrotoxic agents / dehydration  / hypotension -Lisinopril, torsemide and Metformin have been discontinued   CRITICAL CARE Performed by: Roxan Hockey   Total critical care time: 43 minutes  Critical care time was exclusive of separately billable procedures and treating other patients.  Critical care was necessary to treat or prevent imminent or life-threatening deterioration. Vent Settings:- PRVC/40/4/15/560  Critical care was time spent personally by me on the following activities: development of treatment plan with patient and/or surrogate as well as nursing, discussions with consultants, evaluation of patient's response to treatment, examination of patient, obtaining history from patient or surrogate, ordering and performing treatments and interventions, ordering  and review of laboratory studies, ordering and review of radiographic studies, pulse oximetry and re-evaluation of patient's condition.  Disposition/Need for in-Hospital Stay- patient unable to be discharged at this time due to ----persistent hypotension, severe sepsis septic shock  requiring IV Levophed, IV vasopressin, tachycardia/A. fib requiring IV amiodarone -Intubated and sedated for postop airway protection and hypoxic respiratory failure*  Status is: Inpatient  Remains inpatient appropriate because:-persistent hypotension, severe sepsis septic shock requiring IV Levophed, IV vasopressin, tachycardia/A. fib requiring IV amiodarone   Disposition: The patient is from: Home              Anticipated d/c is to: TBD              Anticipated d/c date is: > 3 days              Patient currently is not medically stable to d/c. Barriers: Not Clinically Stable- --persistent hypotension, severe sepsis septic shock requiring IV Levophed, IV vasopressin, tachycardia/A. fib requiring IV amiodarone -Intubated and sedated for postop airway protection and hypoxic respiratory failure*  Procedures:- 8/4 partial colectomy, end colostomy, laparotomy 8/4 RIJ >> 8/4 ETT >>  Code Status : FULL   Family Communication:  Discussed with daughters x 2  Consults  :  Gen surg/PCCM  DVT Prophylaxis  :   - SCDs   Lab Results  Component Value Date   PLT PLT CLUMPING NOTED, COLLECT IN CITRATE FOR CBC 06/22/2020    Inpatient Medications  Scheduled Meds: . sodium chloride   Intravenous Once  . Chlorhexidine Gluconate Cloth  6 each Topical Daily  . insulin aspart  0-15 Units Subcutaneous Q4H  . insulin aspart  0-5 Units Subcutaneous QHS  . ipratropium-albuterol  3 mL Nebulization Q4H   Continuous Infusions: . sodium chloride 50 mL/hr at 06/22/20 0021  . sodium chloride    . fentaNYL infusion INTRAVENOUS 150 mcg/hr (06/22/20 1102)  . magnesium sulfate bolus IVPB    . phenylephrine (NEO-SYNEPHRINE) Adult infusion Stopped (06/22/20 0835)  . piperacillin-tazobactam (ZOSYN)  IV 3.375 g (06/22/20 0834)  . propofol (DIPRIVAN) infusion 10 mcg/kg/min (06/22/20 1055)   PRN Meds:.Place/Maintain arterial line **AND** sodium chloride, fentaNYL, metoprolol tartrate, morphine injection,  ondansetron **OR** ondansetron (ZOFRAN) IV    Anti-infectives (From admission, onward)   Start     Dose/Rate Route Frequency Ordered Stop   06/22/20 0030  piperacillin-tazobactam (ZOSYN) IVPB 3.375 g     Discontinue     3.375 g 12.5 mL/hr over 240 Minutes Intravenous Every 8 hours 06/21/20 1953     06/21/20 1830  cefoTEtan (CEFOTAN) 2 g in sodium chloride 0.9 % 100 mL IVPB        2 g 200 mL/hr over 30 Minutes Intravenous On call to O.R. 06/21/20 1819 06/21/20 2002   06/21/20 1830  fluconazole (DIFLUCAN) IVPB 400 mg  Status:  Discontinued        400 mg 100 mL/hr over 120 Minutes Intravenous Every 24 hours 06/21/20 1828 06/21/20 2340   06/21/20 1630  piperacillin-tazobactam (ZOSYN) IVPB 3.375 g        3.375 g 100 mL/hr over 30 Minutes Intravenous  Once 06/21/20 1615 06/21/20 1751        Objective:   Vitals:   06/22/20 1021 06/22/20 1036 06/22/20 1045 06/22/20 1100  BP:      Pulse: (!) 119 (!) 117 (!) 117 (!) 117  Resp: (!) 26 17 (!) 0 15  Temp: 98.5 F (36.9 C) 99 F (37.2 C)  TempSrc: Axillary Axillary    SpO2: 100% 100% 100% 100%  Weight:      Height:        Wt Readings from Last 3 Encounters:  06/22/20 121.3 kg  06/20/20 115 kg  03/20/20 117.9 kg     Intake/Output Summary (Last 24 hours) at 06/22/2020 1115 Last data filed at 06/22/2020 1109 Gross per 24 hour  Intake 6633.55 ml  Output 767 ml  Net 5866.55 ml    Physical Exam  Gen:- intubated/sedated HEENT:-ET/NG  Neck-right IJ central line.  Lungs-fair air movement bilaterally, no wheezing CV- S1, S2 normal, irregularly irregular tachycardic Abd-  +ve B.Sounds, Abd Soft, ostomy bag with postop open postsurgical wounds  , increased truncal adiposity Extremity/Skin:- No  edema, pedal pulses present \ NeuroPsych-Limited exam as patient is intubated and sedated MSK-right wrist arterial line GU-Foley catheter in situ   dMicro Results Recent Results (from the past 240 hour(s))  SARS Coronavirus 2 by RT PCR  (hospital order, performed in [facility] hospital lab) Nasopharyngeal Nasopharyngeal Swab     Status: None   Collection Time: 06/17/20  1:15 PM   Specimen: Nasopharyngeal Swab  Result Value Ref Range Status   SARS Coronavirus 2 NEGATIVE NEGATIVE Final    Comment: (NOTE) SARS-CoV-2 target nucleic acids are NOT DETECTED.  The SARS-CoV-2 RNA is generally detectable in upper and lower respiratory specimens during the acute phase of infection. The lowest concentration of SARS-CoV-2 viral copies this assay can detect is 250 copies / mL. A negative result does not preclude SARS-CoV-2 infection and should not be used as the sole basis for treatment or other patient management decisions.  A negative result may occur with improper specimen collection / handling, submission of specimen other than nasopharyngeal swab, presence of viral mutation(s) within the areas targeted by this assay, and inadequate number of viral copies (<250 copies / mL). A negative result must be combined with clinical observations, patient history, and epidemiological information.  Fact Sheet for Patients:   StrictlyIdeas.no  Fact Sheet for Healthcare Providers: BankingDealers.co.za  This test is not yet approved or  cleared by the [place] FDA and has been authorized for detection and/or diagnosis of SARS-CoV-2 by FDA under an Emergency Use Authorization (EUA).  This EUA will remain in effect (meaning this test can be used) for the duration of the COVID-19 declaration under Section 564(b)(1) of the Act, 21 U.S.C. section 360bbb-3(b)(1), unless the authorization is terminated or revoked sooner.  Performed at [facility], [address] [place], [place] [address]   SARS Coronavirus 2 by RT PCR (hospital order, performed in [facility] hospital lab) Nasopharyngeal Nasopharyngeal Swab     Status: None   Collection Time: 06/21/20  1:47 PM   Specimen: Nasopharyngeal  Swab  Result Value Ref Range Status   SARS Coronavirus 2 NEGATIVE NEGATIVE Final    Comment: (NOTE) SARS-CoV-2 target nucleic acids are NOT DETECTED.  The SARS-CoV-2 RNA is generally detectable in upper and lower respiratory specimens during the acute phase of infection. The lowest concentration of SARS-CoV-2 viral copies this assay can detect is 250 copies / mL. A negative result does not preclude SARS-CoV-2 infection and should not be used as the sole basis for treatment or other patient management decisions.  A negative result may occur with improper specimen collection / handling, submission of specimen other than nasopharyngeal swab, presence of viral mutation(s) within the areas targeted by this assay, and inadequate number of viral copies (<250 copies / mL). A negative  result must be combined with clinical observations, patient history, and epidemiological information.  Fact Sheet for Patients:   StrictlyIdeas.no  Fact Sheet for Healthcare Providers: BankingDealers.co.za  This test is not yet approved or  cleared by the [place] FDA and has been authorized for detection and/or diagnosis of SARS-CoV-2 by FDA under an Emergency Use Authorization (EUA).  This EUA will remain in effect (meaning this test can be used) for the duration of the COVID-19 declaration under Section 564(b)(1) of the Act, 21 U.S.C. section 360bbb-3(b)(1), unless the authorization is terminated or revoked sooner.  Performed at [facility], [address] [place], [place] [address]   MRSA PCR Screening     Status: None   Collection Time: 06/21/20 11:31 PM   Specimen: Nasal Mucosa; Nasopharyngeal  Result Value Ref Range Status   MRSA by PCR NEGATIVE NEGATIVE Final    Comment:        The GeneXpert MRSA Assay (FDA approved for NASAL specimens only), is one component of a comprehensive MRSA colonization surveillance program. It is not intended to  diagnose MRSA infection nor to guide or monitor treatment for MRSA infections. Performed at [facility], [address] [place],  [address]     Radiology Reports CT ABDOMEN PELVIS W CONTRAST  Result Date: 06/21/2020 CLINICAL DATA:  Acute left lower quadrant abdominal pain. EXAM: CT ABDOMEN AND PELVIS WITH CONTRAST TECHNIQUE: Multidetector CT imaging of the abdomen and pelvis was performed using the standard protocol following bolus administration of intravenous contrast. CONTRAST:  148mL OMNIPAQUE IOHEXOL 300 MG/ML  SOLN COMPARISON:  None. FINDINGS: Lower chest: No acute abnormality. Hepatobiliary: Solitary gallstone is noted. No biliary dilatation is noted. The liver is unremarkable. Pancreas: Unremarkable. No pancreatic ductal dilatation or surrounding inflammatory changes. Spleen: Normal in size without focal abnormality. Adrenals/Urinary Tract: Adrenal glands are unremarkable. Kidneys are normal, without renal calculi, focal lesion, or hydronephrosis. Bladder is unremarkable. Stomach/Bowel: Moderate amount of free air is noted in the peritoneal space, particularly in the epigastric region. This is concerning for rupture of hollow viscus. However the source is not clearly identified. The stomach and appendix are unremarkable. There is no evidence of bowel obstruction or inflammation. Sigmoid diverticulosis is noted. Vascular/Lymphatic: Aortic atherosclerosis. No enlarged abdominal or pelvic lymph nodes. Reproductive: Prostate is unremarkable. Other: No abdominal wall hernia or abnormality. No abdominopelvic ascites. Musculoskeletal: No acute or significant osseous findings. IMPRESSION: 1. Moderate amount of free air is noted in the peritoneal space, particularly in the epigastric region. This is concerning for rupture of hollow viscus. However the source is not clearly identified. Critical Value/emergent results were called by telephone at the time of interpretation on 06/21/2020 at 3:45 pm to  provider ABIGAIL HARRIS , who verbally acknowledged these results. 2. Sigmoid diverticulosis without inflammation. 3. Solitary gallstone. Aortic Atherosclerosis (ICD10-I70.0). Electronically Signed   By: Marijo Conception M.D.   On: 06/21/2020 15:47   US Venous Img Lower Bilateral  Result Date: 06/17/2020 CLINICAL DATA:  Edema EXAM: BILATERAL LOWER EXTREMITY VENOUS DOPPLER ULTRASOUND TECHNIQUE: Gray-scale sonography with compression, as well as color and duplex ultrasound, were performed to evaluate the deep venous system(s) from the level of the common femoral vein through the popliteal and proximal calf veins. COMPARISON:  None. FINDINGS: VENOUS Normal compressibility of the common femoral, superficial femoral, and popliteal veins, as well as the visualized calf veins. Visualized portions of profunda femoral vein and great saphenous vein unremarkable. No filling defects to suggest DVT on grayscale or color Doppler imaging.  Doppler waveforms show normal direction of venous flow, normal respiratory plasticity and response to augmentation. Limited views of the contralateral common femoral vein are unremarkable. OTHER Ill-defined fluid/edema within the subcutaneous soft tissues of both calves. Limitations: none IMPRESSION: 1. No DVT seen, bilateral lower extremities. 2. Fluid/edema within the subcutaneous soft tissues of both calves. Electronically Signed   By: Franki Cabot M.D.   On: 06/17/2020 14:10   DG Chest Port 1 View  Result Date: 06/22/2020 CLINICAL DATA:  Post intubation EXAM: PORTABLE CHEST 1 VIEW COMPARISON:  06/21/2020, CT 03/07/2020 FINDINGS: Interval intubation, tip of the endotracheal tube is about 14 mm superior to the carina. Right-sided central venous catheter tip over the cavoatrial region. Esophageal tube tip below the diaphragm but incompletely visualized. Cardiomegaly with aortic atherosclerosis. No pleural effusion or consolidation. Minimal lucency at the right diaphragm could reflect  small free air demonstrated on CT. IMPRESSION: 1. Interval intubation with tip of the endotracheal tube about 14 mm superior to the carina. Right IJ central venous catheter tip over the cavoatrial region. No pneumothorax. 2. Cardiomegaly. No edema or infiltrate. 3. Possible small amount of free air beneath the right diaphragm, demonstrated on previous CT. Electronically Signed   By: Donavan Foil M.D.   On: 06/22/2020 00:14   DG Chest Port 1 View  Result Date: 06/21/2020 CLINICAL DATA:  Shortness of breath.  History of COPD. EXAM: PORTABLE CHEST 1 VIEW COMPARISON:  Single-view of the chest 06/17/2020 and 11/13/2019. CT chest 11/06/2019. FINDINGS: The lungs are emphysematous. Mild subsegmental atelectasis is seen in the lung bases. The lungs are otherwise clear. Heart size is normal. Aortic atherosclerosis. No pneumothorax or pleural fluid. No acute bony abnormality. IMPRESSION: No acute disease. Aortic Atherosclerosis (ICD10-I70.0) and Emphysema (ICD10-J43.9). Electronically Signed   By: Inge Rise M.D.   On: 06/21/2020 13:58   DG Chest Port 1 View  Result Date: 06/17/2020 CLINICAL DATA:  Bilateral foot swelling EXAM: PORTABLE CHEST 1 VIEW COMPARISON:  03/17/2020 FINDINGS: Stable mild cardiomegaly. Atherosclerotic calcification of the aortic knob. Mildly hyperexpanded lungs coarsened bibasilar interstitial markings. No focal airspace consolidation. No overt edema. No significant pleural fluid collection. No pneumothorax. IMPRESSION: Stable mild cardiomegaly. No overt edema or focal airspace consolidation. Electronically Signed   By: Davina Poke D.O.   On: 06/17/2020 12:30     CBC Recent Labs  Lab 06/17/20 1120 06/18/20 0603 06/19/20 0629 06/21/20 1240 06/22/20 0510  WBC 9.7 8.7 9.4 13.8* 9.2  HGB 10.2* 10.1* 9.6* 10.6* 7.3*  HCT 30.2* 30.8* 29.1* 32.9* 23.0*  PLT 345 353 308 415* PLT CLUMPING NOTED, COLLECT IN CITRATE FOR CBC  MCV 97.1 100.0 101.4* 102.2* 105.0*  MCH 32.8 32.8 33.4  32.9 33.3  MCHC 33.8 32.8 33.0 32.2 31.7  RDW 17.1* 17.7* 17.8* 17.8* 18.3*  LYMPHSABS  --   --   --   --  1.6  MONOABS  --   --   --   --  1.1*  EOSABS  --   --   --   --  0.0  BASOSABS  --   --   --   --  0.0    Chemistries  Recent Labs  Lab 06/17/20 1133 06/17/20 1133 06/18/20 0603 06/19/20 0629 06/20/20 0508 06/21/20 1240 06/22/20 0510  NA 136   < > 137 137 132* 137 137  K 2.2*   < > 2.7* 3.5 4.0 3.8 4.2  CL 90*   < > 96* 101 99 99 102  CO2 32   < >  28 28 23 25 24   GLUCOSE 204*   < > 105* 145* 275* 202* 221*  BUN 19   < > 16 14 21  37* 31*  CREATININE 1.50*   < > 1.16 1.21 1.08 1.19 1.40*  CALCIUM 8.4*   < > 8.5* 8.0* 8.1* 8.4* 7.4*  MG  --   --  2.0  --   --   --  1.6*  AST 26  --   --   --   --  18 18  ALT 30  --   --   --   --  24 21  ALKPHOS 57  --   --   --   --  48 27*  BILITOT 1.0  --   --   --   --  1.0 0.8   < > = values in this interval not displayed.   ------------------------------------------------------------------------------------------------------------------ Recent Labs    06/22/20 0052  TRIG 151*    Lab Results  Component Value Date   HGBA1C 5.4 06/17/2020   ------------------------------------------------------------------------------------------------------------------ No results for input(s): TSH, T4TOTAL, T3FREE, THYROIDAB in the last 72 hours.  Invalid input(s): FREET3 ------------------------------------------------------------------------------------------------------------------ No results for input(s): VITAMINB12, FOLATE, FERRITIN, TIBC, IRON, RETICCTPCT in the last 72 hours.  Coagulation profile No results for input(s): INR, PROTIME in the last 168 hours.  No results for input(s): DDIMER in the last 72 hours.  Cardiac Enzymes No results for input(s): CKMB, TROPONINI, MYOGLOBIN in the last 168 hours.  Invalid input(s):  CK ------------------------------------------------------------------------------------------------------------------    Component Value Date/Time   BNP 87.0 06/17/2020 1134     Olga Seyler M.D on 06/22/2020 at 11:15 AM  Go to [email or web address] - for contact info  [facility] - Office  [phone]

## 2020-06-22 NOTE — Anesthesia Postprocedure Evaluation (Signed)
Anesthesia Post Note  Patient: Vincent Cooper  Procedure(s) Performed: EXPLORATORY LAPAROTOMY,  bowel resection, creation ostomy (N/A )  Patient location during evaluation: ICU Anesthesia Type: General Level of consciousness: patient remains intubated per anesthesia plan and sedated Pain management: pain level controlled Vital Signs Assessment: post-procedure vital signs reviewed and stable Respiratory status: patient remains intubated per anesthesia plan and patient on ventilator - see flowsheet for VS Cardiovascular status: blood pressure returned to baseline and tachycardic Postop Assessment: no apparent nausea or vomiting Anesthetic complications: no Comments: Vital signs - 156/87, pulse - 120, sat 445%   No complications documented.   Last Vitals:  Vitals:   06/21/20 1731 06/21/20 2342  BP:    Pulse:    Resp:    Temp:    SpO2: 94% 100%    Last Pain:                 Audy Dauphine C Trenice [place]

## 2020-06-22 NOTE — Consult Note (Addendum)
[facility] Nurse ostomy consult note Surgery team following for assessment and plan of care for abd wound.    Pt had colostomy surgery performed yesterday.  He is currently sedated and on the vent. No family present during pouch change procedure.  Stoma type/location: Stoma is dark red and viable, flush with skin level, small amt bloody drainage, no stool or gas, 1 1/2 inches and slightly oval from side to side.  Peristomal assessment: Intact skin surrounding Ostomy pouching: Applied barrier ring and one piece convex pouch.   Supplies ordered to the bedside for staff nurse use.   Educational materials left for family members, who arrived just after pouch change was completed.  Informed them [place] team would begin teaching sessions with pt and family members when he is stable and out of ICU.  Enrolled patient in [employer] program: NOT [place] MSN, RN, [place], IXL, [place]

## 2020-06-22 NOTE — Progress Notes (Signed)
[facility] night shift.  The patient became hypotensive with a systolic in the 34L and developed tachycardia with an irregular rhythm at times in the 150s.  His urinary output was decreased despite being given fluids earlier.  He was given at 1000 mL LR bolus, amiodarone 150 mg x 1 and was started on low-dose phenylephrine.  Tennis Must, MD

## 2020-06-22 NOTE — Transfer of Care (Addendum)
Immediate Anesthesia Transfer of Care Note  Patient: Vincent Cooper  Procedure(s) Performed: EXPLORATORY LAPAROTOMY,  bowel resection, creation ostomy (N/A )  Patient Location: PACU and ICU  Anesthesia Type:General  Level of Consciousness: sedated, unresponsive and Patient remains intubated per anesthesia plan  Airway & Oxygen Therapy: Patient remains intubated per anesthesia plan  Post-op Assessment: Report given to RN and Post -op Vital signs reviewed and stable  Post vital signs: Reviewed and stable  Last Vitals:  Vitals Value Taken Time  BP 108/65 06/22/20 0000  Temp 98.3 1136  Pulse 121 06/22/20 0005  Resp 15 06/22/20 0005  SpO2 100 % 06/22/20 0005  Vitals shown include unvalidated device data.  Last Pain:  Vitals:   06/21/20 1633  TempSrc:   PainSc: 10-Worst pain ever         Complications: No complications documented.

## 2020-06-22 NOTE — Consult Note (Signed)
NAME:  Vincent Cooper, MRN:  [id number], DOB:  Mar 04, 1941, LOS: 1 ADMISSION DATE:  06/21/2020, CONSULTATION DATE:  06/22/2020  REFERRING MD:  Courage, triad  CHIEF COMPLAINT: Intubated post-op  Brief History   79 year old obese man with COPD, diabetes, hypertension, CAD, pulmonary embolism on Eliquis presented with colonic perforation and underwent partial colectomy, end colostomy emergently, developed septic shock  History of present illness   He was admitted from 7/31-8/3 for bilateral lower extremity edema and erythema treated as cellulitis, failed outpatient antibiotics.  Initially treated with cefazolin and then broadened to Vanco and cefepime.  Solu-Medrol was added for COPD exacerbation and this improved his erythema suggesting that there was some inflammatory component to his cellulitis.  Discharged on prednisone taper  On 8/4 he presented with acute abdomen , imaging showed pneumoperitoneum of unknown origin.  He was on Eliquis for PE and was given Kcentra.  He was on steroid taper and received stress dose hydrocortisone in the OR.  murky ascites was noted with signs of fecal peritonitis, underwent partial colectomy and end colostomy He developed hypotension and was placed on Neo-Synephrine Intra-Op which was changed to Levophed.  He developed tachyarrhythmia and received 1 dose amiodarone  Past Medical History  COPD Diabetes type 2 Hypertension Chronic diastolic CHF Pulmonary embolism on Eliquis, diagnosed 10/2019 CAD OSA , poor compliance with CPAP Recent hospitalization for cellulitis 7/31-8/3  Significant Hospital Events   8/4 emergent laparotomy  Consults:  General surgery  Procedures:  8/4 partial colectomy, end colostomy, laparotomy 8/4 RIJ >> 8/4 ETT >>  Significant Diagnostic Tests:  CT abdomen/pelvis 8/4 >> pneumoperitoneum, no site of perforation identified  Micro Data:    Antimicrobials:  Zosyn 8/4 >>   Interim history/subjective:  Critically ill,  intubated Increasing Levophed requirements Low urine output 370 cc last 12 hours  Objective   Blood pressure 125/66, pulse (!) 147, temperature (P) 98.4 F (36.9 C), temperature source (P) Axillary, resp. rate 17, height 5\' 9"  (1.753 m), weight 121.3 kg, SpO2 100 %. CVP:  [7 mmHg-11 mmHg] 9 mmHg  Vent Mode: PRVC FiO2 (%):  [40 %-60 %] 40 % Set Rate:  [15 bmp] 15 bmp Vt Set:  [560 mL] 560 mL PEEP:  [5 cmH20] 5 cmH20 Plateau Pressure:  [17 WVP71-06 cmH20] 17 cmH20   Intake/Output Summary (Last 24 hours) at 06/22/2020 1400 Last data filed at 06/22/2020 1315 Gross per 24 hour  Intake 6633.55 ml  Output 791 ml  Net 5842.55 ml   Filed Weights   06/21/20 1227 06/22/20 0000  Weight: 117.5 kg 121.3 kg    Examination: General: Obese, critically ill, intubated, mild distress HENT: Mild pallor, no icterus, no JVD, no lymph nodes Lungs: Decreased breath sounds bilateral, prolonged expiration noted on vent waveforms Cardiovascular: S1-S2 tacky, regular sinus on monitor Abdomen: Soft, distended, colostomy appears pink, midline incision partially open Extremities: 2+ edema Neuro: Does not follow commands, sedated on propofol, RASS +2 to -2 GU: Minimal clear urine  Chest x-ray personally reviewed shows ET tube when right IJ in position , cardiomegaly, no effusions, free air below right diaphragm  Resolved Hospital Problem list     Assessment & Plan:  Septic shock post colonic perforation in this elderly man with multiple comorbidities  Septic shock -continue Levophed, if severe tachyarrhythmias may have to revert back to Neo-Synephrine Add vasopressin Goal MAP 65 He was only on steroids for a few days but will add stress dose steroids since pressor requirements are high  Acute respiratory failure -  vent settings reviewed and adjusted ABG acceptable, not a candidate for weaning currently  Acute encephalopathy -sedation needs on vent Will use fentanyl drip for pain, goal RASS -1 and  minimize propofol  Chronic diastolic heart failure -hold Lasix and other meds for now Atrial fibrillation/RVR -beta-blockers limited by hypotension, use IV amiodarone History of PE on Eliquis -anticoagulation held due to oozing, okay to hold for short duration, can start IV heparin in 24 hours if okay with surgery  AKI -follow urine output, maintain CVP 10 and above Avoid contrast and other nephrotoxins  Acute blood loss anemia -transfuse 1 unit PRBC, goal hemoglobin 8 & above  Fecal peritonitis/colonic perforation -Postop management per surgery -Continue Zosyn, but should provide anaerobic coverage too. Consider adding Diflucan  Uncontrolled hyperglycemia -goal CBG less than 180 Add Levemir 10 every 12  Overall guarded prognosis after emergent surgery, will assist with postop intensive care management  Best practice:  Diet: NPO Pain/Anxiety/Delirium protocol (if indicated): Fentanyl/propofol, goal RASS -1 VAP protocol (if indicated): Y DVT prophylaxis: on full anticoagulation GI prophylaxis: Protonix Glucose control: SSI/Levemir Mobility: Bedrest Code Status: Full Family Communication: Per primary Disposition: ICU  Labs   CBC: Recent Labs  Lab 06/17/20 1120 06/18/20 0603 06/19/20 0629 06/21/20 1240 06/22/20 0510  WBC 9.7 8.7 9.4 13.8* 9.2  NEUTROABS  --   --   --   --  6.2  HGB 10.2* 10.1* 9.6* 10.6* 7.3*  HCT 30.2* 30.8* 29.1* 32.9* 23.0*  MCV 97.1 100.0 101.4* 102.2* 105.0*  PLT 345 353 308 415* PLT CLUMPING NOTED, COLLECT IN CITRATE FOR CBC    Basic Metabolic Panel: Recent Labs  Lab 06/18/20 0603 06/19/20 0629 06/20/20 0508 06/21/20 1240 06/22/20 0510  NA 137 137 132* 137 137  K 2.7* 3.5 4.0 3.8 4.2  CL 96* 101 99 99 102  CO2 28 28 23 25 24   GLUCOSE 105* 145* 275* 202* 221*  BUN 16 14 21  37* 31*  CREATININE 1.16 1.21 1.08 1.19 1.40*  CALCIUM 8.5* 8.0* 8.1* 8.4* 7.4*  MG 2.0  --   --   --  1.6*  PHOS  --   --   --   --  4.0   GFR: Estimated  Creatinine Clearance: 55.9 mL/min (A) (by C-G formula based on SCr of 1.4 mg/dL (H)). Recent Labs  Lab 06/18/20 0603 06/19/20 0629 06/21/20 1240 06/22/20 0510  WBC 8.7 9.4 13.8* 9.2    Liver Function Tests: Recent Labs  Lab 06/17/20 1133 06/21/20 1240 06/22/20 0510  AST 26 18 18   ALT 30 24 21   ALKPHOS 57 48 27*  BILITOT 1.0 1.0 0.8  PROT 6.0* 6.3* 4.1*  ALBUMIN 3.0* 3.4* 2.0*   No results for input(s): LIPASE, AMYLASE in the last 168 hours. No results for input(s): AMMONIA in the last 168 hours.  ABG    Component Value Date/Time   PHART 7.421 06/22/2020 0158   PCO2ART 36.4 06/22/2020 0158   PO2ART 232 (H) 06/22/2020 0158   ACIDBASEDEF 0.6 06/22/2020 0158   O2SAT >100.0 06/22/2020 0158     Coagulation Profile: No results for input(s): INR, PROTIME in the last 168 hours.  Cardiac Enzymes: No results for input(s): CKTOTAL, CKMB, CKMBINDEX, TROPONINI in the last 168 hours.  HbA1C: Hgb A1c MFr Bld  Date/Time Value Ref Range Status  06/17/2020 11:20 AM 5.4 4.8 - 5.6 % Final    Comment:    (NOTE) Pre diabetes:          5.7%-6.4%  Diabetes:              >  6.4%  Glycemic control for   <7.0% adults with diabetes   01/19/2020 07:30 PM 5.9 (H) 4.8 - 5.6 % Final    Comment:    (NOTE) Pre diabetes:          5.7%-6.4% Diabetes:              >6.4% Glycemic control for   <7.0% adults with diabetes     CBG: Recent Labs  Lab 06/19/20 2034 06/20/20 0739 06/20/20 1131 06/22/20 0739 06/22/20 1111  GLUCAP 378* 267*  267* 316* 201* 225*    Review of Systems:   Unable to obtain since intubated, sedated  Past Medical History  He,  has a past medical history of Chronic gout, COPD (chronic obstructive pulmonary disease) ([place]), Coronary artery disease due to calcified coronary lesion (03/08/2020), Diabetes mellitus type II, non insulin dependent ([place]), Hypertension, Morbid obesity ([facility]), OSA on CPAP, Pulmonary emboli ([place]) (10/2019), and RLS (restless legs  syndrome).   Surgical History    Past Surgical History:  Procedure Laterality Date  . Cataract surgery Right   . LAPAROTOMY N/A 06/21/2020   Procedure: EXPLORATORY LAPAROTOMY,  bowel resection, creation ostomy;  Surgeon: Virl Cagey, MD;  Location: [facility];  Service: General;  Laterality: N/A;     Social History   reports that he has quit smoking. He has quit using smokeless tobacco. He reports previous alcohol use. He reports that he does not use drugs.   Family History   His family history includes Colon cancer in his mother. There is no history of CAD or Diabetes.   Allergies Allergies  Allergen Reactions  . Other     Patient reports he was allergic to something in an IV he was given but does not know what the substance was. As of 01/19/2020      Home Medications  Prior to Admission medications   Medication Sig Start Date End Date Taking? Authorizing Provider  albuterol (VENTOLIN HFA) 108 (90 Base) MCG/ACT inhaler Inhale 1 puff into the lungs every 6 (six) hours as needed for wheezing or shortness of breath.   Yes [provider]  apixaban (ELIQUIS) 5 MG TABS tablet Take 1 tablet (5 mg total) by mouth 2 (two) times daily. 01/21/20  Yes Barton Dubois, MD  cefdinir (OMNICEF) 300 MG capsule Take 1 capsule (300 mg total) by mouth every 12 (twelve) hours. 06/20/20  Yes Tat, Shanon Brow, MD  diltiazem (CARDIZEM CD) 180 MG 24 hr capsule Take 2 capsules (360 mg total) by mouth daily. Patient taking differently: Take 360 mg by mouth at bedtime.  03/15/20  Yes Leonie Man, MD  doxycycline (VIBRAMYCIN) 100 MG capsule Take 100 mg by mouth 2 (two) times daily. For 10 days   Yes [provider]  glimepiride (AMARYL) 2 MG tablet Take 2 mg by mouth daily with breakfast.   Yes [provider]  guaiFENesin (MUCINEX) 600 MG 12 hr tablet Take 1 tablet (600 mg total) by mouth 2 (two) times daily. 01/21/20  Yes Barton Dubois, MD  lisinopril (ZESTRIL) 40 MG tablet Take 0.5  tablets (20 mg total) by mouth daily. Patient taking differently: Take 20 mg by mouth at bedtime.  03/15/20  Yes Leonie Man, MD  metFORMIN (GLUCOPHAGE) 500 MG tablet Take 500 mg by mouth 2 (two) times daily with a meal.   Yes [provider]  nitroGLYCERIN (NITROSTAT) 0.4 MG SL tablet Place 1 tablet (0.4 mg total) under the tongue every 5 (five) minutes as needed  for chest pain. 01/19/20 06/21/21 Yes Leonie Man, MD  predniSONE (DELTASONE) 10 MG tablet Take 6 tablets (60 mg total) by mouth daily with breakfast. And decrease by one tablet daily 06/21/20  Yes Tat, Shanon Brow, MD  rosuvastatin (CRESTOR) 40 MG tablet Take 1 tablet (40 mg total) by mouth daily. 03/20/20 06/21/21 Yes Leonie Man, MD  torsemide (DEMADEX) 20 MG tablet Take 20 mg by mouth 2 (two) times daily. 05/19/20  Yes [provider]  TRELEGY ELLIPTA 100-62.5-25 MCG/INH AEPB Inhale 1 puff into the lungs daily. 05/12/20  Yes [provider]  triamcinolone cream (KENALOG) 0.1 % Apply 1 application topically 2 (two) times daily. 06/13/20  Yes [provider]     Critical care time: [place] MD. FCCP. [facility]  If no response to pager , please call [phone]   06/22/2020

## 2020-06-22 NOTE — Progress Notes (Signed)
[facility] Progress Note  1 Day Post-Op  Subjective: Patient with some hypotension and tachycardia overnight, received bolus and started on phenylephrine. He had colon perforation potentially from a fish bone based on the findings and some murky ascites intraabdominal, at risk of sepsis. Also blood loss from anticoagulation and surgery despite Kcentra.  UOP lower in the last few hours, Cr up slightly. Urine is clear.   Objective: Vital signs in last 24 hours: Temp:  [98 F (36.7 C)-99.6 F (37.6 C)] 98 F (36.7 C) (08/05 0400) Pulse Rate:  [99-123] 113 (08/05 0700) Resp:  [15-24] 16 (08/05 0700) BP: (99-138)/(54-97) 125/66 (08/05 0600) SpO2:  [91 %-100 %] 100 % (08/05 0700) FiO2 (%):  [40 %-60 %] 40 % (08/05 0522) Weight:  [117.5 kg-121.3 kg] 121.3 kg (08/05 0000) Last BM Date: 06/20/20  Intake/Output from previous day: 08/04 0701 - 08/05 0700 In: 6293.6 [I.V.:4859.1; IV Piggyback:1434.5] Out: 720 [Urine:370; Blood:300] Intake/Output this shift: No intake/output data recorded.  General appearance: opens eyes to speech, does not follow commands Resp: ventilated  GI: soft, distended, midline with dressing removed, no drainage or erythema, ostomy edematous and dusky, may slough, some gas in bag  Lab Results:  Recent Labs    06/21/20 1240 06/22/20 0510  WBC 13.8* 9.2  HGB 10.6* 7.3*  HCT 32.9* 23.0*  PLT 415* PLT CLUMPING NOTED, COLLECT IN CITRATE FOR CBC   BMET Recent Labs    06/21/20 1240 06/22/20 0510  NA 137 137  K 3.8 4.2  CL 99 102  CO2 25 24  GLUCOSE 202* 221*  BUN 37* 31*  CREATININE 1.19 1.40*  CALCIUM 8.4* 7.4*   PT/INR No results for input(s): LABPROT, INR in the last 72 hours.  Studies/Results: CT ABDOMEN PELVIS W CONTRAST  Result Date: 06/21/2020 CLINICAL DATA:  Acute left lower quadrant abdominal pain. EXAM: CT ABDOMEN AND PELVIS WITH CONTRAST TECHNIQUE: Multidetector CT imaging of the abdomen and pelvis was performed using  the standard protocol following bolus administration of intravenous contrast. CONTRAST:  152mL OMNIPAQUE IOHEXOL 300 MG/ML  SOLN COMPARISON:  None. FINDINGS: Lower chest: No acute abnormality. Hepatobiliary: Solitary gallstone is noted. No biliary dilatation is noted. The liver is unremarkable. Pancreas: Unremarkable. No pancreatic ductal dilatation or surrounding inflammatory changes. Spleen: Normal in size without focal abnormality. Adrenals/Urinary Tract: Adrenal glands are unremarkable. Kidneys are normal, without renal calculi, focal lesion, or hydronephrosis. Bladder is unremarkable. Stomach/Bowel: Moderate amount of free air is noted in the peritoneal space, particularly in the epigastric region. This is concerning for rupture of hollow viscus. However the source is not clearly identified. The stomach and appendix are unremarkable. There is no evidence of bowel obstruction or inflammation. Sigmoid diverticulosis is noted. Vascular/Lymphatic: Aortic atherosclerosis. No enlarged abdominal or pelvic lymph nodes. Reproductive: Prostate is unremarkable. Other: No abdominal wall hernia or abnormality. No abdominopelvic ascites. Musculoskeletal: No acute or significant osseous findings. IMPRESSION: 1. Moderate amount of free air is noted in the peritoneal space, particularly in the epigastric region. This is concerning for rupture of hollow viscus. However the source is not clearly identified. Critical Value/emergent results were called by telephone at the time of interpretation on 06/21/2020 at 3:45 pm to provider ABIGAIL HARRIS , who verbally acknowledged these results. 2. Sigmoid diverticulosis without inflammation. 3. Solitary gallstone. Aortic Atherosclerosis (ICD10-I70.0). Electronically Signed   By: Marijo Conception M.D.   On: 06/21/2020 15:47   DG Chest Port 1 View  Result Date: 06/22/2020 CLINICAL DATA:  Post intubation EXAM:  PORTABLE CHEST 1 VIEW COMPARISON:  06/21/2020, CT 03/07/2020 FINDINGS: Interval  intubation, tip of the endotracheal tube is about 14 mm superior to the carina. Right-sided central venous catheter tip over the cavoatrial region. Esophageal tube tip below the diaphragm but incompletely visualized. Cardiomegaly with aortic atherosclerosis. No pleural effusion or consolidation. Minimal lucency at the right diaphragm could reflect small free air demonstrated on CT. IMPRESSION: 1. Interval intubation with tip of the endotracheal tube about 14 mm superior to the carina. Right IJ central venous catheter tip over the cavoatrial region. No pneumothorax. 2. Cardiomegaly. No edema or infiltrate. 3. Possible small amount of free air beneath the right diaphragm, demonstrated on previous CT. Electronically Signed   By: Donavan Foil M.D.   On: 06/22/2020 00:14   DG Chest Port 1 View  Result Date: 06/21/2020 CLINICAL DATA:  Shortness of breath.  History of COPD. EXAM: PORTABLE CHEST 1 VIEW COMPARISON:  Single-view of the chest 06/17/2020 and 11/13/2019. CT chest 11/06/2019. FINDINGS: The lungs are emphysematous. Mild subsegmental atelectasis is seen in the lung bases. The lungs are otherwise clear. Heart size is normal. Aortic atherosclerosis. No pneumothorax or pleural fluid. No acute bony abnormality. IMPRESSION: No acute disease. Aortic Atherosclerosis (ICD10-I70.0) and Emphysema (ICD10-J43.9). Electronically Signed   By: Inge Rise M.D.   On: 06/21/2020 13:58    Anti-infectives: Anti-infectives (From admission, onward)   Start     Dose/Rate Route Frequency Ordered Stop   06/22/20 0030  piperacillin-tazobactam (ZOSYN) IVPB 3.375 g     Discontinue     3.375 g 12.5 mL/hr over 240 Minutes Intravenous Every 8 hours 06/21/20 1953     06/21/20 1830  cefoTEtan (CEFOTAN) 2 g in sodium chloride 0.9 % 100 mL IVPB        2 g 200 mL/hr over 30 Minutes Intravenous On call to O.R. 06/21/20 1819 06/21/20 2002   06/21/20 1830  fluconazole (DIFLUCAN) IVPB 400 mg  Status:  Discontinued        400  mg 100 mL/hr over 120 Minutes Intravenous Every 24 hours 06/21/20 1828 06/21/20 2340   06/21/20 1630  piperacillin-tazobactam (ZOSYN) IVPB 3.375 g        3.375 g 100 mL/hr over 30 Minutes Intravenous  Once 06/21/20 1615 06/21/20 1751      Assessment/Plan: Mr. Cheatum is a 79 yo with colonic perforation based on the surgical findings possibly from a fish bone in the setting of moderate about of pneumoperitoneum on CT.  He has become hypotensive and tachycardic and received 1L bolus and started on phenylephrine. Propofol for sedation, PRN morphine Vent per pulmonary Tachycardic, hypotension, CVL in place, can monitor CVP and see if needs more resuscitation Was on steroid taper, and received one stress dose in the OR Would switch to levophed from phenyleprine per surviving sepsis guidelines  NPO, NG, some gas in bag H&H down, was on anticoagulation, hold, may need blood for resuscitation UOP down, CVP pending, may need resuscitation, CR up  Zosyn for intraperitoneal infection/ contamination, will need 5 days post op, ending 06/27/2020 SCDs, holding anticoagulation given the H&H drop, history of PE, will need to start heparin at some point  Updated daughter [name] from a surgery standpoint.    LOS: 1 day    Virl Cagey 06/22/2020

## 2020-06-22 NOTE — Plan of Care (Signed)
CRITICAL VALUE ALERT  Critical Value:  Lactic Acid 2.3  Date & Time Notied:  06/22/2020 1620  Provider Notified: Dr. Carlena Hurl & Dr. Constance Haw  Orders Received/Actions taken: See new orders

## 2020-06-22 NOTE — Progress Notes (Signed)
[facility]  Checked on patient. UOP is low. Tachycardic and hypotensive. On levophed, vasopressin, amiodarone. Received 1 U pRBC, CVP12, ostomy is dusky and edematous. Repeat H&H pending, added BMP and Lactic to trend.   Updated daughter at bedside. Discussed with Dr. Elsworth Soho.  [name], MD [facility] [address] [place], [place] [address] [phone] (office)

## 2020-06-23 ENCOUNTER — Inpatient Hospital Stay (HOSPITAL_COMMUNITY): Payer: Medicare PPO

## 2020-06-23 DIAGNOSIS — N179 Acute kidney failure, unspecified: Secondary | ICD-10-CM

## 2020-06-23 LAB — BLOOD GAS, ARTERIAL
Acid-base deficit: 0.4 mmol/L (ref 0.0–2.0)
Bicarbonate: 24.1 mmol/L (ref 20.0–28.0)
FIO2: 35
O2 Saturation: 97.4 %
Patient temperature: 37
pCO2 arterial: 39.3 mmHg (ref 32.0–48.0)
pH, Arterial: 7.399 (ref 7.350–7.450)
pO2, Arterial: 88.5 mmHg (ref 83.0–108.0)

## 2020-06-23 LAB — MAGNESIUM: Magnesium: 2.2 mg/dL (ref 1.7–2.4)

## 2020-06-23 LAB — GLUCOSE, CAPILLARY
Glucose-Capillary: 109 mg/dL — ABNORMAL HIGH (ref 70–99)
Glucose-Capillary: 148 mg/dL — ABNORMAL HIGH (ref 70–99)
Glucose-Capillary: 163 mg/dL — ABNORMAL HIGH (ref 70–99)
Glucose-Capillary: 229 mg/dL — ABNORMAL HIGH (ref 70–99)
Glucose-Capillary: 242 mg/dL — ABNORMAL HIGH (ref 70–99)
Glucose-Capillary: 251 mg/dL — ABNORMAL HIGH (ref 70–99)
Glucose-Capillary: 275 mg/dL — ABNORMAL HIGH (ref 70–99)

## 2020-06-23 LAB — PHOSPHORUS: Phosphorus: 4.6 mg/dL (ref 2.5–4.6)

## 2020-06-23 LAB — CBC
HCT: 26.2 % — ABNORMAL LOW (ref 39.0–52.0)
Hemoglobin: 8.3 g/dL — ABNORMAL LOW (ref 13.0–17.0)
MCH: 31.3 pg (ref 26.0–34.0)
MCHC: 31.7 g/dL (ref 30.0–36.0)
MCV: 98.9 fL (ref 80.0–100.0)
Platelets: 317 10*3/uL (ref 150–400)
RBC: 2.65 MIL/uL — ABNORMAL LOW (ref 4.22–5.81)
RDW: 18.6 % — ABNORMAL HIGH (ref 11.5–15.5)
WBC: 15.8 10*3/uL — ABNORMAL HIGH (ref 4.0–10.5)
nRBC: 2.5 % — ABNORMAL HIGH (ref 0.0–0.2)

## 2020-06-23 LAB — BASIC METABOLIC PANEL
Anion gap: 12 (ref 5–15)
BUN: 37 mg/dL — ABNORMAL HIGH (ref 8–23)
CO2: 21 mmol/L — ABNORMAL LOW (ref 22–32)
Calcium: 7 mg/dL — ABNORMAL LOW (ref 8.9–10.3)
Chloride: 102 mmol/L (ref 98–111)
Creatinine, Ser: 2.01 mg/dL — ABNORMAL HIGH (ref 0.61–1.24)
GFR calc Af Amer: 36 mL/min — ABNORMAL LOW (ref >60)
GFR calc non Af Amer: 31 mL/min — ABNORMAL LOW (ref >60)
Glucose, Bld: 281 mg/dL — ABNORMAL HIGH (ref 70–99)
Potassium: 4.2 mmol/L (ref 3.5–5.1)
Sodium: 135 mmol/L (ref 135–145)

## 2020-06-23 LAB — SURGICAL PATHOLOGY

## 2020-06-23 MED ORDER — SODIUM CHLORIDE 0.9 % IV SOLN: INTRAVENOUS | Status: DC

## 2020-06-23 MED ORDER — HEPARIN SODIUM (PORCINE) 5000 UNIT/ML IJ SOLN
5000.0000 [IU] | Freq: Three times a day (TID) | INTRAMUSCULAR | Status: DC
  Administered 2020-06-23 – 2020-06-26 (×10): 5000 [IU] via SUBCUTANEOUS
  Filled 2020-06-23 (×9): qty 1

## 2020-06-23 MED ORDER — INSULIN DETEMIR 100 UNIT/ML ~~LOC~~ SOLN
15.0000 [IU] | Freq: Two times a day (BID) | SUBCUTANEOUS | Status: DC
  Administered 2020-06-23 – 2020-06-24 (×2): 15 [IU] via SUBCUTANEOUS
  Filled 2020-06-23 (×6): qty 0.15

## 2020-06-23 MED FILL — Sodium Chloride IV Soln 0.9%: INTRAVENOUS | Qty: 100 | Status: AC

## 2020-06-23 MED FILL — Vasopressin IV Soln 20 Unit/ML (For IV Infusion): INTRAVENOUS | Qty: 1 | Status: AC

## 2020-06-23 NOTE — Addendum Note (Signed)
Addendum  created 06/23/20 1313 by Denese Killings, MD   Clinical Note Signed

## 2020-06-23 NOTE — Progress Notes (Signed)
Inpatient Diabetes Program Recommendations  AACE/ADA: New Consensus Statement on Inpatient Glycemic Control (2015)  Target Ranges:  Prepandial:   less than 140 mg/dL      Peak postprandial:   less than 180 mg/dL (1-2 hours)      Critically ill patients:  140 - 180 mg/dL   Lab Results  Component Value Date   GLUCAP 242 (H) 06/23/2020   HGBA1C 5.4 06/17/2020    Review of Glycemic Control Results for FRANKI, STEMEN (MRN [id number]) as of 06/23/2020 10:27  Ref. Range 06/22/2020 16:57 06/22/2020 20:18 06/23/2020 00:11 06/23/2020 04:49 06/23/2020 07:57  Glucose-Capillary Latest Ref Range: 70 - 99 mg/dL 224 (H) 249 (H) 251 (H) 275 (H) 242 (H)  Results for WANYA, BANGURA (MRN [id number]) as of 06/23/2020 10:27  Ref. Range 06/23/2020 04:59  Hemoglobin Latest Ref Range: 13.0 - 17.0 g/dL 8.3 (L)   Diabetes history:  DM2 Outpatient Diabetes medications:  Amaryl 2 mg daily  Metformin Metformin 500 BID  Prednisone Current orders for Inpatient glycemic control:  Lantus 10 units BID  Novolog 0-15 Q4H  Inpatient Diabetes Program Recommendations:    Lantus 15 units BID  Will continue to follow while inpatient.  Thank you, Reche Dixon, RN, BSN Diabetes Coordinator Inpatient Diabetes Program [phone] (team pager from 8a-5p)

## 2020-06-23 NOTE — Care Management Important Message (Signed)
Important Message  Patient Details  Name: Vincent Cooper MRN: [id number] Date of Birth: February 24, 1941   Medicare Important Message Given:  Yes     Tommy Medal 06/23/2020, 4:11 PM

## 2020-06-23 NOTE — Progress Notes (Signed)
Patient Demographics:    Vincent Cooper, is a 79 y.o. male, DOB - 24-Feb-1941, NIO:[id number]  Admit date - 06/21/2020   Admitting Physician Albertine Patricia, MD  Outpatient Primary MD for the patient is [name], DO  LOS - 2   Chief Complaint  Patient presents with  . Abdominal Pain        Subjective:    Vincent Cooper today -did well with sedation vacation, was weaned off vent and extubated successfully currently on nasal cannula --Patient's daughter at bedside, questions answered No fever  Or chills  -Ostomy bag without gas or fecal material   Assessment  & Plan :    Principal Problem:   Bowel perforation (HCC) Active Problems:   COPD (chronic obstructive pulmonary disease) (HCC)   Diabetes mellitus type II, non insulin dependent (HCC)   OSA and COPD overlap syndrome (HCC)   Essential hypertension   Morbid obesity (HCC)   Chronic diastolic HF (heart failure) (HCC)   Coronary artery disease, non-occlusive   Hyperlipidemia with target LDL less than 70   Free intraperitoneal air   Brief Summary:- 79 year old obese man with dm2, htn, cad, COPD, H/o PE on Apixaban admitted on 06/21/2020 with abdominal pain and found to have colonic perforation due to fishbone impaction and underwent partial colectomy, end colostomy emergently on 06/21/2020, postop patient had persistent hypotension most likely due to severe sepsis with septic shock -Hemodynamics improved patient weaned off pressors on 06/23/2020, off vent and extubated on 7 06/23/2020  A/p 1)Severe sepsis with septic shock--surgical pathology suggest diverticulitis with bowel perforation and abscess resulting in fecal peritonitis peritonitis -and severe sepsis with septic shock --Sepsis pathophysiology appears to have resolved as of 06/23/2020 -Hypotension resolved, discontinued Neo-Synephrine, discontinue vasopressin and discontinue Levophed on  06/23/2020 --- -lactic acid is 2.1 >>2.3>>1.8 ---Continue IV Zosyn started 06/21/2020  2)New onset A. fib with RVR --tachyarrhythmia in the setting of severe sepsis and septic shock--- did not respond to IV metoprolol, continue on IV amiodarone until able to take oral intake at which time consider transitioning to p.o. Cardizem last known EF was 60 to 65% based on echo from December 2020  3) history of PE--- diagnosed in December 2020-- PTA was on Eliquis currently on hold perioperatively, restart heparin without bolus on 06/24/2020 aspirin general surgeon -Received Kcentra preop -Okay to use subcu heparin for DVT prophylaxis at this time  4)DM2- Use Novolog/Humalog Sliding scale insulin with Accu-Cheks/Fingersticks as ordered -Patient currently n.p.o. so avoid over aggressive diabetic control  5)Obesity/OSA--- history of poor compliance with CPAP PTA,   6) acute symptomatic anemia due to acute blood loss---  hemoglobin down to 8.3 from  9.6  (was 7.3 prior to transfusion), -----received 1 unit of PRBC on 06/22/20  ---keep hemoglobin > 8   7)AKI----acute kidney injury due to reduced renal perfusion in the setting of persistent hypotension in the setting of severe sepsis with septic shock compounded by lisinopril, torsemide and Metformin use PTA -   creatinine on admission= 1.0 , baseline creatinine = 1.0   , creatinine is now= 2.0 (new peak) ,  renally adjust medications, avoid nephrotoxic agents / dehydration  / hypotension -Lisinopril, torsemide and Metformin have been discontinued   CRITICAL CARE Performed by: Roxan Hockey  Total critical care time: 42 minutes  Critical care time was exclusive of separately billable procedures and treating other patients.  Critical care was necessary to treat or prevent imminent or life-threatening deterioration. Vent Settings:- PRVC/40/4/15/560  --Successfully weaned and extubated on 06/23/2020  Critical care was time spent personally by me on  the following activities: development of treatment plan with patient and/or surrogate as well as nursing, discussions with consultants, evaluation of patient's response to treatment, examination of patient, obtaining history from patient or surrogate, ordering and performing treatments and interventions, ordering and review of laboratory studies, ordering and review of radiographic studies, pulse oximetry and re-evaluation of patient's condition.  Disposition/Need for in-Hospital Stay- patient unable to be discharged at this time due to ----awaiting return of bowel function and tolerance of oral intake--- currently requiring IV fluids Status is: Inpatient  Remains inpatient appropriate because:awaiting return of bowel function and tolerance of oral intake--- currently requiring IV fluids   Disposition: The patient is from: Home              Anticipated d/c is to: TBD              Anticipated d/c date is: > 3 days              Patient currently is not medically stable to d/c. Barriers: Not Clinically Stable- -awaiting return of bowel function and tolerance of oral intake--- currently requiring IV fluids  Procedures:- 8/4 partial colectomy, end colostomy, laparotomy 8/4 RIJ >> 8/4 ETT >> -Extubated 06/23/2020  Code Status : FULL   Family Communication:  Discussed with daughters x 2  Consults  :  Gen surg/PCCM  DVT Prophylaxis  :   - SCDs /Heparin sq  Lab Results  Component Value Date   PLT 317 06/23/2020    Inpatient Medications  Scheduled Meds: . sodium chloride   Intravenous Once  . chlorhexidine gluconate (MEDLINE KIT)  15 mL Mouth Rinse BID  . Chlorhexidine Gluconate Cloth  6 each Topical Daily  . insulin aspart  0-15 Units Subcutaneous Q4H  . insulin detemir  15 Units Subcutaneous BID  . levalbuterol  0.63 mg Nebulization Q6H  . mouth rinse  15 mL Mouth Rinse 10 times per day  . pantoprazole (PROTONIX) IV  40 mg Intravenous Q12H   Continuous Infusions: . sodium chloride  50 mL/hr at 06/23/20 1123  . sodium chloride    . amiodarone 30 mg/hr (06/23/20 1123)  . piperacillin-tazobactam (ZOSYN)  IV 3.375 g (06/23/20 0921)   PRN Meds:.Place/Maintain arterial line **AND** sodium chloride, metoprolol tartrate, morphine injection, ondansetron **OR** ondansetron (ZOFRAN) IV    Anti-infectives (From admission, onward)   Start     Dose/Rate Route Frequency Ordered Stop   06/22/20 0030  piperacillin-tazobactam (ZOSYN) IVPB 3.375 g     Discontinue     3.375 g 12.5 mL/hr over 240 Minutes Intravenous Every 8 hours 06/21/20 1953     06/21/20 1830  cefoTEtan (CEFOTAN) 2 g in sodium chloride 0.9 % 100 mL IVPB        2 g 200 mL/hr over 30 Minutes Intravenous On call to O.R. 06/21/20 1819 06/21/20 2002   06/21/20 1830  fluconazole (DIFLUCAN) IVPB 400 mg  Status:  Discontinued        400 mg 100 mL/hr over 120 Minutes Intravenous Every 24 hours 06/21/20 1828 06/21/20 2340   06/21/20 1630  piperacillin-tazobactam (ZOSYN) IVPB 3.375 g        3.375 g 100 mL/hr over 30  Minutes Intravenous  Once 06/21/20 1615 06/21/20 1751        Objective:   Vitals:   06/23/20 0645 06/23/20 0700 06/23/20 1100 06/23/20 1300  BP:      Pulse: 100  (!) 105 98  Resp: _0 Temp:  98.1 F (36.7 C)    TempSrc:  Axillary    SpO2: 100%  96% 97%  Weight:      Height:        Wt Readings from Last 3 Encounters:  06/22/20 121.3 kg  06/20/20 115 kg  03/20/20 117.9 kg     Intake/Output Summary (Last 24 hours) at 06/23/2020 1326 Last data filed at 06/23/2020 1123 Gross per 24 hour  Intake 3395.42 ml  Output 738 ml  Net 2657.42 ml    Physical Exam  Gen:-Alert in no acute distress HEENT:-ET/NG --extubated 8/62021 Neck-right IJ central line.  Lungs-fair air movement bilaterally, no wheezing CV- S1, S2 normal, irregularly irregular tachycardic Abd-  +ve B.Sounds, Abd Soft, ostomy bag without gas of fecal material, postop open postsurgical wounds  , increased truncal  adiposity Extremity/Skin:- No  edema, pedal pulses present \ NeuroPsych-awake, alert x3, no new focal deficits  MSK-right wrist arterial line GU-Foley catheter in situ   dMicro Results Recent Results (from the past 240 hour(s))  SARS Coronavirus 2 by RT PCR (hospital order, performed in [facility] hospital lab) Nasopharyngeal Nasopharyngeal Swab     Status: None   Collection Time: 06/17/20  1:15 PM   Specimen: Nasopharyngeal Swab  Result Value Ref Range Status   SARS Coronavirus 2 NEGATIVE NEGATIVE Final    Comment: (NOTE) SARS-CoV-2 target nucleic acids are NOT DETECTED.  The SARS-CoV-2 RNA is generally detectable in upper and lower respiratory specimens during the acute phase of infection. The lowest concentration of SARS-CoV-2 viral copies this assay can detect is 250 copies / mL. A negative result does not preclude SARS-CoV-2 infection and should not be used as the sole basis for treatment or other patient management decisions.  A negative result may occur with improper specimen collection / handling, submission of specimen other than nasopharyngeal swab, presence of viral mutation(s) within the areas targeted by this assay, and inadequate number of viral copies (<250 copies / mL). A negative result must be combined with clinical observations, patient history, and epidemiological information.  Fact Sheet for Patients:   StrictlyIdeas.no  Fact Sheet for Healthcare Providers: BankingDealers.co.za  This test is not yet approved or  cleared by the [place] FDA and has been authorized for detection and/or diagnosis of SARS-CoV-2 by FDA under an Emergency Use Authorization (EUA).  This EUA will remain in effect (meaning this test can be used) for the duration of the COVID-19 declaration under Section 564(b)(1) of the Act, 21 U.S.C. section 360bbb-3(b)(1), unless the authorization is terminated or revoked sooner.  Performed at  [facility], [address], [place], [place] [address]   SARS Coronavirus 2 by RT PCR (hospital order, performed in [facility] hospital lab) Nasopharyngeal Nasopharyngeal Swab     Status: None   Collection Time: 06/21/20  1:47 PM   Specimen: Nasopharyngeal Swab  Result Value Ref Range Status   SARS Coronavirus 2 NEGATIVE NEGATIVE Final    Comment: (NOTE) SARS-CoV-2 target nucleic acids are NOT DETECTED.  The SARS-CoV-2 RNA is generally detectable in upper and lower respiratory specimens during the acute phase of infection. The lowest concentration of SARS-CoV-2 viral copies this assay can detect is 250 copies / mL.  A negative result does not preclude SARS-CoV-2 infection and should not be used as the sole basis for treatment or other patient management decisions.  A negative result may occur with improper specimen collection / handling, submission of specimen other than nasopharyngeal swab, presence of viral mutation(s) within the areas targeted by this assay, and inadequate number of viral copies (<250 copies / mL). A negative result must be combined with clinical observations, patient history, and epidemiological information.  Fact Sheet for Patients:   StrictlyIdeas.no  Fact Sheet for Healthcare Providers: BankingDealers.co.za  This test is not yet approved or  cleared by the [place] FDA and has been authorized for detection and/or diagnosis of SARS-CoV-2 by FDA under an Emergency Use Authorization (EUA).  This EUA will remain in effect (meaning this test can be used) for the duration of the COVID-19 declaration under Section 564(b)(1) of the Act, 21 U.S.C. section 360bbb-3(b)(1), unless the authorization is terminated or revoked sooner.  Performed at [facility], [address], [place], [place] [address]   MRSA PCR Screening     Status: None   Collection Time: 06/21/20 11:31 PM   Specimen: Nasal Mucosa; Nasopharyngeal   Result Value Ref Range Status   MRSA by PCR NEGATIVE NEGATIVE Final    Comment:        The GeneXpert MRSA Assay (FDA approved for NASAL specimens only), is one component of a comprehensive MRSA colonization surveillance program. It is not intended to diagnose MRSA infection nor to guide or monitor treatment for MRSA infections. Performed at [facility], [address] [place], [place] [address]     Radiology Reports CT ABDOMEN PELVIS W CONTRAST  Result Date: 06/21/2020 CLINICAL DATA:  Acute left lower quadrant abdominal pain. EXAM: CT ABDOMEN AND PELVIS WITH CONTRAST TECHNIQUE: Multidetector CT imaging of the abdomen and pelvis was performed using the standard protocol following bolus administration of intravenous contrast. CONTRAST:  138m OMNIPAQUE IOHEXOL 300 MG/ML  SOLN COMPARISON:  None. FINDINGS: Lower chest: No acute abnormality. Hepatobiliary: Solitary gallstone is noted. No biliary dilatation is noted. The liver is unremarkable. Pancreas: Unremarkable. No pancreatic ductal dilatation or surrounding inflammatory changes. Spleen: Normal in size without focal abnormality. Adrenals/Urinary Tract: Adrenal glands are unremarkable. Kidneys are normal, without renal calculi, focal lesion, or hydronephrosis. Bladder is unremarkable. Stomach/Bowel: Moderate amount of free air is noted in the peritoneal space, particularly in the epigastric region. This is concerning for rupture of hollow viscus. However the source is not clearly identified. The stomach and appendix are unremarkable. There is no evidence of bowel obstruction or inflammation. Sigmoid diverticulosis is noted. Vascular/Lymphatic: Aortic atherosclerosis. No enlarged abdominal or pelvic lymph nodes. Reproductive: Prostate is unremarkable. Other: No abdominal wall hernia or abnormality. No abdominopelvic ascites. Musculoskeletal: No acute or significant osseous findings. IMPRESSION: 1. Moderate amount of free air is noted in the  peritoneal space, particularly in the epigastric region. This is concerning for rupture of hollow viscus. However the source is not clearly identified. Critical Value/emergent results were called by telephone at the time of interpretation on 06/21/2020 at 3:45 pm to provider ABIGAIL HARRIS , who verbally acknowledged these results. 2. Sigmoid diverticulosis without inflammation. 3. Solitary gallstone. Aortic Atherosclerosis (ICD10-I70.0). Electronically Signed   By: JMarijo ConceptionM.D.   On: 06/21/2020 15:47   [place]Venous Img Lower Bilateral  Result Date: 06/17/2020 CLINICAL DATA:  Edema EXAM: BILATERAL LOWER EXTREMITY VENOUS DOPPLER ULTRASOUND TECHNIQUE: Gray-scale sonography with compression, as well as color and duplex ultrasound, were performed to  evaluate the deep venous system(s) from the level of the common femoral vein through the popliteal and proximal calf veins. COMPARISON:  None. FINDINGS: VENOUS Normal compressibility of the common femoral, superficial femoral, and popliteal veins, as well as the visualized calf veins. Visualized portions of profunda femoral vein and great saphenous vein unremarkable. No filling defects to suggest DVT on grayscale or color Doppler imaging. Doppler waveforms show normal direction of venous flow, normal respiratory plasticity and response to augmentation. Limited views of the contralateral common femoral vein are unremarkable. OTHER Ill-defined fluid/edema within the subcutaneous soft tissues of both calves. Limitations: none IMPRESSION: 1. No DVT seen, bilateral lower extremities. 2. Fluid/edema within the subcutaneous soft tissues of both calves. Electronically Signed   By: Franki Cabot M.D.   On: 06/17/2020 14:10   DG Chest Port 1 View  Result Date: 06/23/2020 CLINICAL DATA:  Acute respiratory failure. EXAM: PORTABLE CHEST 1 VIEW COMPARISON:  06/22/2020.  CT 06/21/2020. FINDINGS: Endotracheal tube, NG tube, right IJ line stable position. Stable cardiomegaly. Low  lung volumes with bibasilar atelectasis. No pleural effusion or pneumothorax. Free air in the right hemidiaphragm best identified by prior CT. IMPRESSION: 1.  Endotracheal tube, NG tube, right IJ line stable position. 2.  Stable cardiomegaly. 3. Low lung volumes with bibasilar atelectasis again noted. Chest is stable from prior exam. 4. Free air under the right hemidiaphragm best identified by prior CT. Electronically Signed   By: Marcello Moores  Register   On: 06/23/2020 07:00   DG Chest Port 1 View  Result Date: 06/22/2020 CLINICAL DATA:  Post intubation EXAM: PORTABLE CHEST 1 VIEW COMPARISON:  06/21/2020, CT 03/07/2020 FINDINGS: Interval intubation, tip of the endotracheal tube is about 14 mm superior to the carina. Right-sided central venous catheter tip over the cavoatrial region. Esophageal tube tip below the diaphragm but incompletely visualized. Cardiomegaly with aortic atherosclerosis. No pleural effusion or consolidation. Minimal lucency at the right diaphragm could reflect small free air demonstrated on CT. IMPRESSION: 1. Interval intubation with tip of the endotracheal tube about 14 mm superior to the carina. Right IJ central venous catheter tip over the cavoatrial region. No pneumothorax. 2. Cardiomegaly. No edema or infiltrate. 3. Possible small amount of free air beneath the right diaphragm, demonstrated on previous CT. Electronically Signed   By: Donavan Foil M.D.   On: 06/22/2020 00:14   DG Chest Port 1 View  Result Date: 06/21/2020 CLINICAL DATA:  Shortness of breath.  History of COPD. EXAM: PORTABLE CHEST 1 VIEW COMPARISON:  Single-view of the chest 06/17/2020 and 11/13/2019. CT chest 11/06/2019. FINDINGS: The lungs are emphysematous. Mild subsegmental atelectasis is seen in the lung bases. The lungs are otherwise clear. Heart size is normal. Aortic atherosclerosis. No pneumothorax or pleural fluid. No acute bony abnormality. IMPRESSION: No acute disease. Aortic Atherosclerosis (ICD10-I70.0) and  Emphysema (ICD10-J43.9). Electronically Signed   By: Inge Rise M.D.   On: 06/21/2020 13:58   DG Chest Port 1 View  Result Date: 06/17/2020 CLINICAL DATA:  Bilateral foot swelling EXAM: PORTABLE CHEST 1 VIEW COMPARISON:  03/17/2020 FINDINGS: Stable mild cardiomegaly. Atherosclerotic calcification of the aortic knob. Mildly hyperexpanded lungs coarsened bibasilar interstitial markings. No focal airspace consolidation. No overt edema. No significant pleural fluid collection. No pneumothorax. IMPRESSION: Stable mild cardiomegaly. No overt edema or focal airspace consolidation. Electronically Signed   By: Davina Poke D.O.   On: 06/17/2020 12:30     CBC Recent Labs  Lab 06/18/20 0603 06/18/20 0603 06/19/20 0629 06/21/20 1240 06/22/20 0510 06/22/20  1544 06/23/20 0459  WBC 8.7  --  9.4 13.8* 9.2  --  15.8*  HGB 10.1*   < > 9.6* 10.6* 7.3* 9.6* 8.3*  HCT 30.8*   < > 29.1* 32.9* 23.0* 30.5* 26.2*  PLT 353  --  308 415* PLT CLUMPING NOTED, COLLECT IN CITRATE FOR CBC  --  317  MCV 100.0  --  101.4* 102.2* 105.0*  --  98.9  MCH 32.8  --  33.4 32.9 33.3  --  31.3  MCHC 32.8  --  33.0 32.2 31.7  --  31.7  RDW 17.7*  --  17.8* 17.8* 18.3*  --  18.6*  LYMPHSABS  --   --   --   --  1.6  --   --   MONOABS  --   --   --   --  1.1*  --   --   EOSABS  --   --   --   --  0.0  --   --   BASOSABS  --   --   --   --  0.0  --   --    < > = values in this interval not displayed.    Chemistries  Recent Labs  Lab 06/17/20 1133 06/17/20 1133 06/18/20 0603 06/19/20 0629 06/20/20 0508 06/21/20 1240 06/22/20 0510 06/22/20 1544 06/23/20 0459  NA 136   < > 137   < > 132* 137 137 133* 135  K 2.2*   < > 2.7*   < > 4.0 3.8 4.2 4.3 4.2  CL 90*   < > 96*   < > 99 99 102 102 102  CO2 32   < > 28   < > _0 20* 21*  GLUCOSE 204*   < > 105*   < > 275* 202* 221* 243* 281*  BUN 19   < > 16   < > 21 37* 31* 37* 37*  CREATININE 1.50*   < > 1.16   < > 1.08 1.19 1.40* 1.69* 2.01*  CALCIUM 8.4*   < >  8.5*   < > 8.1* 8.4* 7.4* 7.3* 7.0*  MG  --   --  2.0  --   --   --  1.6*  --  2.2  AST 26  --   --   --   --  18 18  --   --   ALT 30  --   --   --   --  24 21  --   --   ALKPHOS 57  --   --   --   --  48 27*  --   --   BILITOT 1.0  --   --   --   --  1.0 0.8  --   --    < > = values in this interval not displayed.   ------------------------------------------------------------------------------------------------------------------ Recent Labs    06/22/20 0052  TRIG 151*    Lab Results  Component Value Date   HGBA1C 5.4 06/17/2020   ------------------------------------------------------------------------------------------------------------------ No results for input(s): TSH, T4TOTAL, T3FREE, THYROIDAB in the last 72 hours.  Invalid input(s): FREET3 ------------------------------------------------------------------------------------------------------------------ No results for input(s): VITAMINB12, FOLATE, FERRITIN, TIBC, IRON, RETICCTPCT in the last 72 hours.  Coagulation profile No results for input(s): INR, PROTIME in the last 168 hours.  No results for input(s): DDIMER in the last 72 hours.  Cardiac Enzymes No results for input(s): CKMB, TROPONINI, MYOGLOBIN in the last 168 hours.  Invalid input(s): CK ------------------------------------------------------------------------------------------------------------------    Component Value Date/Time   BNP 87.0 06/17/2020 1134    Raife Lizer M.D on 06/23/2020 at 1:26 PM  Go to [email or web address] - for contact info  [facility] - Office  [phone]

## 2020-06-23 NOTE — Progress Notes (Addendum)
[facility] Progress Note  2 Days Post-Op  Subjective: Doing much better. Off pressors. Amiodarone for rate control. Extubated. Feeling good. No complaints. Wants water. Ostomy without gas. Received 1u PRBC, H&H drifting back down.   Objective: Vital signs in last 24 hours: Temp:  [98.1 F (36.7 C)-99.6 F (37.6 C)] 98.1 F (36.7 C) (08/06 0700) Pulse Rate:  [37-148] 98 (08/06 1300) Resp:  [0-31] 17 (08/06 1300) SpO2:  [96 %-100 %] 97 % (08/06 1300) FiO2 (%):  [35 %-40 %] 35 % (08/06 0819) Last BM Date: 06/20/20  Intake/Output from previous day: 08/05 0701 - 08/06 0700 In: 1677.9 [I.V.:1243.8; Blood:340; IV Piggyback:94.1] Out: 31 [Urine:649; Emesis/NG output:160] Intake/Output this shift: Total I/O In: 2057.5 [I.V.:1895; IV Piggyback:162.5] Out: -   General appearance: alert, cooperative and no distress Resp: normal work of breathing GI: soft, obese, midline c/d/i with dressing changed, no drainage or erythema, ostomy edematous no gas in bag  Lab Results:  Recent Labs    06/22/20 0510 06/22/20 0510 06/22/20 1544 06/23/20 0459  WBC 9.2  --   --  15.8*  HGB 7.3*   < > 9.6* 8.3*  HCT 23.0*   < > 30.5* 26.2*  PLT PLT CLUMPING NOTED, COLLECT IN CITRATE FOR CBC  --   --  317   < > = values in this interval not displayed.   BMET Recent Labs    06/22/20 1544 06/23/20 0459  NA 133* 135  K 4.3 4.2  CL 102 102  CO2 20* 21*  GLUCOSE 243* 281*  BUN 37* 37*  CREATININE 1.69* 2.01*  CALCIUM 7.3* 7.0*   PT/INR No results for input(s): LABPROT, INR in the last 72 hours.  Studies/Results: CT ABDOMEN PELVIS W CONTRAST  Result Date: 06/21/2020 CLINICAL DATA:  Acute left lower quadrant abdominal pain. EXAM: CT ABDOMEN AND PELVIS WITH CONTRAST TECHNIQUE: Multidetector CT imaging of the abdomen and pelvis was performed using the standard protocol following bolus administration of intravenous contrast. CONTRAST:  184mL OMNIPAQUE IOHEXOL 300 MG/ML  SOLN  COMPARISON:  None. FINDINGS: Lower chest: No acute abnormality. Hepatobiliary: Solitary gallstone is noted. No biliary dilatation is noted. The liver is unremarkable. Pancreas: Unremarkable. No pancreatic ductal dilatation or surrounding inflammatory changes. Spleen: Normal in size without focal abnormality. Adrenals/Urinary Tract: Adrenal glands are unremarkable. Kidneys are normal, without renal calculi, focal lesion, or hydronephrosis. Bladder is unremarkable. Stomach/Bowel: Moderate amount of free air is noted in the peritoneal space, particularly in the epigastric region. This is concerning for rupture of hollow viscus. However the source is not clearly identified. The stomach and appendix are unremarkable. There is no evidence of bowel obstruction or inflammation. Sigmoid diverticulosis is noted. Vascular/Lymphatic: Aortic atherosclerosis. No enlarged abdominal or pelvic lymph nodes. Reproductive: Prostate is unremarkable. Other: No abdominal wall hernia or abnormality. No abdominopelvic ascites. Musculoskeletal: No acute or significant osseous findings. IMPRESSION: 1. Moderate amount of free air is noted in the peritoneal space, particularly in the epigastric region. This is concerning for rupture of hollow viscus. However the source is not clearly identified. Critical Value/emergent results were called by telephone at the time of interpretation on 06/21/2020 at 3:45 pm to provider ABIGAIL HARRIS , who verbally acknowledged these results. 2. Sigmoid diverticulosis without inflammation. 3. Solitary gallstone. Aortic Atherosclerosis (ICD10-I70.0). Electronically Signed   By: Marijo Conception M.D.   On: 06/21/2020 15:47   DG Chest Port 1 View  Result Date: 06/23/2020 CLINICAL DATA:  Acute respiratory failure. EXAM: PORTABLE CHEST 1 VIEW  COMPARISON:  06/22/2020.  CT 06/21/2020. FINDINGS: Endotracheal tube, NG tube, right IJ line stable position. Stable cardiomegaly. Low lung volumes with bibasilar atelectasis. No  pleural effusion or pneumothorax. Free air in the right hemidiaphragm best identified by prior CT. IMPRESSION: 1.  Endotracheal tube, NG tube, right IJ line stable position. 2.  Stable cardiomegaly. 3. Low lung volumes with bibasilar atelectasis again noted. Chest is stable from prior exam. 4. Free air under the right hemidiaphragm best identified by prior CT. Electronically Signed   By: Marcello Moores  Register   On: 06/23/2020 07:00   DG Chest Port 1 View  Result Date: 06/22/2020 CLINICAL DATA:  Post intubation EXAM: PORTABLE CHEST 1 VIEW COMPARISON:  06/21/2020, CT 03/07/2020 FINDINGS: Interval intubation, tip of the endotracheal tube is about 14 mm superior to the carina. Right-sided central venous catheter tip over the cavoatrial region. Esophageal tube tip below the diaphragm but incompletely visualized. Cardiomegaly with aortic atherosclerosis. No pleural effusion or consolidation. Minimal lucency at the right diaphragm could reflect small free air demonstrated on CT. IMPRESSION: 1. Interval intubation with tip of the endotracheal tube about 14 mm superior to the carina. Right IJ central venous catheter tip over the cavoatrial region. No pneumothorax. 2. Cardiomegaly. No edema or infiltrate. 3. Possible small amount of free air beneath the right diaphragm, demonstrated on previous CT. Electronically Signed   By: Donavan Foil M.D.   On: 06/22/2020 00:14   DG Chest Port 1 View  Result Date: 06/21/2020 CLINICAL DATA:  Shortness of breath.  History of COPD. EXAM: PORTABLE CHEST 1 VIEW COMPARISON:  Single-view of the chest 06/17/2020 and 11/13/2019. CT chest 11/06/2019. FINDINGS: The lungs are emphysematous. Mild subsegmental atelectasis is seen in the lung bases. The lungs are otherwise clear. Heart size is normal. Aortic atherosclerosis. No pneumothorax or pleural fluid. No acute bony abnormality. IMPRESSION: No acute disease. Aortic Atherosclerosis (ICD10-I70.0) and Emphysema (ICD10-J43.9). Electronically Signed    By: Inge Rise M.D.   On: 06/21/2020 13:58    Anti-infectives: Anti-infectives (From admission, onward)   Start     Dose/Rate Route Frequency Ordered Stop   06/22/20 0030  piperacillin-tazobactam (ZOSYN) IVPB 3.375 g     Discontinue     3.375 g 12.5 mL/hr over 240 Minutes Intravenous Every 8 hours 06/21/20 1953     06/21/20 1830  cefoTEtan (CEFOTAN) 2 g in sodium chloride 0.9 % 100 mL IVPB        2 g 200 mL/hr over 30 Minutes Intravenous On call to O.R. 06/21/20 1819 06/21/20 2002   06/21/20 1830  fluconazole (DIFLUCAN) IVPB 400 mg  Status:  Discontinued        400 mg 100 mL/hr over 120 Minutes Intravenous Every 24 hours 06/21/20 1828 06/21/20 2340   06/21/20 1630  piperacillin-tazobactam (ZOSYN) IVPB 3.375 g        3.375 g 100 mL/hr over 30 Minutes Intravenous  Once 06/21/20 1615 06/21/20 1751     Pathology: FINAL MICROSCOPIC DIAGNOSIS:   A. COLON, DESCENDING, RESECTION:  - Diverticulitis with perforation, abscess, and serositis.  - No dysplasia or malignancy.   B. COLON, SPLENIC FLEXURE, LEFT, RESECTION:  - Diverticulosis.  - No dysplasia or malignancy.   Assessment/Plan: Mr. Bas is a 79 yo pod 2 s/p Ex lap, partial colectomy end colostomy for colonic perforation based on the surgical findings possibly from a fish bone in the setting of moderate about of pneumoperitoneum on CT. Pathology saying diverticulitis perforation with abscess. Doing much better.  PRN for  pain IS post extubation HD improved, off pressors NPO, NG can have some sips/ ice Ostomy RN has come and changed bag, ostomy a little dusky/ edematous H&H drifting after 1 upRBC yesterday, could be dilution versus bleeding Zosyn for intraperitoneal contamination/ diverticulitis  SCDs, holding heparin gtt until H&H more stable, hopefully can start tomorrow, can do sq prophylaxis for now give history of PE Updated [name] at bedside Wound change BID UOP looks like improving, Cr up, AKI   Discussed with Dr.  Denton Brick and team.    LOS: 2 days    Virl Cagey 06/23/2020

## 2020-06-23 NOTE — TOC Initial Note (Signed)
Transition of Care [facility]) - Initial/Assessment Note    Patient Details  Name: Vincent Cooper MRN: [id number] Date of Birth: 04-15-1941  Transition of [facility] CM/SW Contact:    Shade Flood, LCSW Phone Number: 06/23/2020, 3:15 PM  Clinical Narrative:                  Pt admitted from home. Pt was recently discharged from [facility] on 06/20/20 and was then re-admitted to [facility] on 06/21/20 due to a fish bone causing bowel perforation. Pt had surgery on 06/21/20 and has a new colostomy. Pt had been set up with [facility] PT with [facility] upon dc from [facility]. Spoke with Tim at [facility] today to update on pt's need for [facility] as well at dc due to colostomy. Tim states that they can provide RN along with PT.  Messaged Md to inform of need for [facility] orders prior to dc. DC timeframe not yet identified.   Assigned TOC will follow and assist.  Expected Discharge Plan: [place] Barriers to Discharge: Continued Medical Work up   Patient Goals and CMS Choice        Expected Discharge Plan and Services Expected Discharge Plan: [place] In-house Referral: Clinical Social Work   Post Acute Care Choice: Resumption of Svcs/PTA Provider Living arrangements for the past 2 months: Single Family Home                                      Prior Living Arrangements/Services Living arrangements for the past 2 months: Single Family Home Lives with:: Self Patient language and need for interpreter reviewed:: Yes Do you feel safe going back to the place where you live?: Yes      Need for Family Participation in Patient Care: Yes (Comment) Care giver support system in place?: Yes (comment) Current home services: Home PT Criminal Activity/Legal Involvement Pertinent to Current Situation/Hospitalization: No - Comment as needed  Activities of Daily Living      Permission Sought/Granted                  Emotional Assessment       Orientation: : Oriented to  Self, Oriented to Place, Oriented to  Time, Oriented to Situation Alcohol / Substance Use: Not Applicable Psych Involvement: No (comment)  Admission diagnosis:  Bowel perforation ([place]) [K63.1] Free intraperitoneal air [K66.8] Patient Active Problem List   Diagnosis Date Noted  . Bowel perforation ([place]) 06/21/2020  . Free intraperitoneal air   . Cellulitis, leg 06/17/2020  . AKI (acute kidney injury) ([place]) 06/17/2020  . Coronary artery disease, non-occlusive 03/20/2020  . Hyperlipidemia with target LDL less than 70 03/20/2020  . Acute respiratory failure with hypoxia ([place])   . Bronchiectasis with acute exacerbation ([place])   . Chronic diastolic HF (heart failure) ([place])   . Chest pain with moderate risk for cardiac etiology 01/19/2020  . Shortness of breath 01/19/2020  . Morbid obesity ([place]) 01/19/2020  . COPD with acute exacerbation ([place]) 01/19/2020  . Cardiac murmur 11/07/2019  . COPD (chronic obstructive pulmonary disease) ([place]) 11/07/2019  . Diabetes mellitus type II, non insulin dependent ([place]) 11/07/2019  . OSA and COPD overlap syndrome ([place]) 11/07/2019  . Essential hypertension 11/07/2019  . Single subsegmental pulmonary embolism without acute cor pulmonale ([place]) 11/06/2019   PCP:  [name], DO Pharmacy:   [place], [place] -  [place] [address] Phone: [phone] Fax: [place], [place]. [address] [place] [address] Phone: [phone] Fax: [phone]     Social Determinants of Health (SDOH) Interventions    Readmission Risk Interventions Readmission Risk Prevention Plan 06/23/2020  Transportation Screening Complete  HRI or Home Care Consult Complete  Palliative Care Screening Not Applicable  Medication Review (RN Care Manager) Complete

## 2020-06-23 NOTE — Progress Notes (Signed)
NAME:  Vincent Cooper, MRN:  [id number], DOB:  Oct 14, 1941, LOS: 2 ADMISSION DATE:  06/21/2020, CONSULTATION DATE:  06/23/2020  REFERRING MD:  Courage, triad  CHIEF COMPLAINT: Intubated post-op  Brief History   79 year old obese man with COPD, diabetes, hypertension, CAD, pulmonary embolism on Eliquis presented with colonic perforation and underwent partial colectomy, end colostomy emergently, developed septic shock  History of present illness   He was admitted from 7/31-8/3 for bilateral lower extremity edema and erythema treated as cellulitis, failed outpatient antibiotics.  Initially treated with cefazolin and then broadened to Vanco and cefepime.  Solu-Medrol was added for COPD exacerbation and this improved his erythema suggesting that there was some inflammatory component to his cellulitis.  Discharged on prednisone taper  On 8/4 he presented with acute abdomen , imaging showed pneumoperitoneum of unknown origin.  He was on Eliquis for PE and was given Kcentra.  He was on steroid taper and received stress dose hydrocortisone in the OR.  murky ascites was noted with signs of fecal peritonitis, underwent partial colectomy and end colostomy He developed hypotension and was placed on Neo-Synephrine Intra-Op which was changed to Levophed.  He developed tachyarrhythmia and received 1 dose amiodarone  Past Medical History  COPD Diabetes type 2 Hypertension Chronic diastolic CHF Pulmonary embolism on Eliquis, diagnosed 10/2019 CAD OSA , poor compliance with CPAP Recent hospitalization for cellulitis 7/31-8/3  Significant Hospital Events   8/4 emergent laparotomy  Consults:  General surgery  Procedures:  8/4 partial colectomy, end colostomy, laparotomy 8/4 RIJ >> 8/4 ETT >> 8/6  Significant Diagnostic Tests:  CT abdomen/pelvis 8/4 >> pneumoperitoneum, no site of perforation identified  Micro Data:    Antimicrobials:  Zosyn 8/4 >>   Interim history/subjective:  Critically ill,  intubated Weaned off Levophed and vasopressin Starting to make urine Awake on fentanyl drip   Objective   Blood pressure 125/66, pulse (!) 105, temperature 98.1 F (36.7 C), temperature source Axillary, resp. rate 12, height 5\' 9"  (1.753 m), weight 121.3 kg, SpO2 96 %. CVP:  [1 mmHg-36 mmHg] 1 mmHg  Vent Mode: CPAP;PSV FiO2 (%):  [35 %-40 %] 35 % Set Rate:  [15 bmp] 15 bmp Vt Set:  [560 mL] 560 mL PEEP:  [5 cmH20] 5 cmH20 Pressure Support:  [7 cmH20] 7 cmH20 Plateau Pressure:  [16 cmH20-18 cmH20] 18 cmH20   Intake/Output Summary (Last 24 hours) at 06/23/2020 1318 Last data filed at 06/23/2020 1123 Gross per 24 hour  Intake 3395.42 ml  Output 738 ml  Net 2657.42 ml   Filed Weights   06/21/20 1227 06/22/20 0000  Weight: 117.5 kg 121.3 kg    Examination: General: Obese, critically ill, intubated, no distress HENT: Mild pallor, no icterus, no JVD, no lymph nodes Lungs: Decreased breath sounds bilateral, Cardiovascular: S1-S2 tacky, regular sinus on monitor Abdomen: Soft, distended, colostomy appears pink, midline incision partially open Extremities: 2+ edema Neuro: On fentanyl, RASS S1, follows commands, mouthing words GU: Minimal clear urine  Chest x-ray personally reviewed shows ET tube when right IJ in position , cardiomegaly, no effusions, no infiltrates  Labs show slight increase in creatinine, hyperglycemia, leukocytosis and stable anemia  Resolved Hospital Problem list     Assessment & Plan:  Septic shock post colonic perforation in this elderly man with multiple comorbidities  Septic shock -remarkably improved ! dc stress dose steroids since off pressors  Acute respiratory failure -weaning well on pressure support 5/5 ABG acceptable, proceed with extubation  Acute encephalopathy -DC all sedation and  use fentanyl as needed for pain   Chronic diastolic heart failure -hold Lasix and other meds for another 24 hours, can then resume Atrial fibrillation/RVR  -beta-blockers limited by hypotension, on IV amiodarone , back in sinus rhythm, if maintains, can DC amiodarone History of PE on Eliquis -anticoagulation held due to oozing, can start IV heparin i if okay with surgery  AKI -nonoliguric, expect to recover Avoid contrast and other nephrotoxins  Acute blood loss anemia -s/p 1 unit PRBC 8/5, goal hemoglobin 8 & above  Fecal peritonitis/colonic perforation -Postop management per surgery -Continue Zosyn  Uncontrolled hyperglycemia -goal CBG less than 180 Added Levemir 10 every 12, can titrate SSI  Overall guarded prognosis after emergent surgery but has improved remarkably.  Hopefully we can see him through this   Best practice:  Diet: NPO Pain/Anxiety/Delirium protocol (if indicated): Fentanyl/propofol, goal RASS -1 VAP protocol (if indicated): Y DVT prophylaxis: on full anticoagulation GI prophylaxis: Protonix Glucose control: SSI/Levemir Mobility: Bedrest Code Status: Full Family Communication: Per primary Disposition: ICU   Critical care time: [place] MD. FCCP. [facility]  If no response to pager , please call [phone]   06/23/2020

## 2020-06-23 NOTE — Anesthesia Postprocedure Evaluation (Signed)
Anesthesia Post Note  Patient: Vincent Cooper  Procedure(s) Performed: EXPLORATORY LAPAROTOMY,  bowel resection, creation ostomy (N/A )  Patient location during evaluation: ICU Anesthesia Type: General Level of consciousness: awake and alert and oriented Pain management: pain level controlled Vital Signs Assessment: post-procedure vital signs reviewed and stable Respiratory status: spontaneous breathing and respiratory function stable (Patient was extubated, on O2 nasal cannula) Cardiovascular status: blood pressure returned to baseline Postop Assessment: no apparent nausea or vomiting Anesthetic complications: no   No complications documented.   Last Vitals:  Vitals:   06/23/20 0700 06/23/20 1100  BP:    Pulse:  (!) 105  Resp:  12  Temp: 36.7 C   SpO2:  96%    Last Pain:  Vitals:   06/23/20 0700  TempSrc: Axillary  PainSc:                  Kenecia Barren C Lovey Crupi

## 2020-06-23 NOTE — Procedures (Signed)
Extubation Procedure Note  Patient Details:   Name: Vincent Cooper DOB: Apr 23, 1941 MRN: [id number]   Airway Documentation:  Airway 8 mm (Active)  Secured at (cm) 25 cm 06/23/20 0819  Measured From Lips 06/23/20 [place] 06/23/20 0819  Secured By [employer] 06/23/20 0819  Tube Holder Repositioned Yes 06/23/20 0819  Cuff Pressure (cm H2O) 26 cm H2O 06/23/20 0819  Site Condition Dry 06/23/20 0819   Vent ended 06/23/2020 at 1020.  Evaluation  O2 sats: 28% Complications: no complications noted Patient tolerated procedure well. BBS noted and coarse.    Patient was to speak clearly and was oriented  Lucianne Muss 06/23/2020, 10:55 AM

## 2020-06-24 DIAGNOSIS — E119 Type 2 diabetes mellitus without complications: Secondary | ICD-10-CM

## 2020-06-24 LAB — CBC
HCT: 22.6 % — ABNORMAL LOW (ref 39.0–52.0)
Hemoglobin: 7 g/dL — ABNORMAL LOW (ref 13.0–17.0)
MCH: 31.4 pg (ref 26.0–34.0)
MCHC: 31 g/dL (ref 30.0–36.0)
MCV: 101.3 fL — ABNORMAL HIGH (ref 80.0–100.0)
Platelets: 207 10*3/uL (ref 150–400)
RBC: 2.23 MIL/uL — ABNORMAL LOW (ref 4.22–5.81)
RDW: 17.9 % — ABNORMAL HIGH (ref 11.5–15.5)
WBC: 12.9 10*3/uL — ABNORMAL HIGH (ref 4.0–10.5)
nRBC: 0.7 % — ABNORMAL HIGH (ref 0.0–0.2)

## 2020-06-24 LAB — COMPREHENSIVE METABOLIC PANEL
ALT: 24 U/L (ref 0–44)
AST: 25 U/L (ref 15–41)
Albumin: 2 g/dL — ABNORMAL LOW (ref 3.5–5.0)
Alkaline Phosphatase: 37 U/L — ABNORMAL LOW (ref 38–126)
Anion gap: 8 (ref 5–15)
BUN: 37 mg/dL — ABNORMAL HIGH (ref 8–23)
CO2: 25 mmol/L (ref 22–32)
Calcium: 7.5 mg/dL — ABNORMAL LOW (ref 8.9–10.3)
Chloride: 109 mmol/L (ref 98–111)
Creatinine, Ser: 2.04 mg/dL — ABNORMAL HIGH (ref 0.61–1.24)
GFR calc Af Amer: 35 mL/min — ABNORMAL LOW (ref >60)
GFR calc non Af Amer: 30 mL/min — ABNORMAL LOW (ref >60)
Glucose, Bld: 72 mg/dL (ref 70–99)
Potassium: 3.2 mmol/L — ABNORMAL LOW (ref 3.5–5.1)
Sodium: 142 mmol/L (ref 135–145)
Total Bilirubin: 0.6 mg/dL (ref 0.3–1.2)
Total Protein: 4.8 g/dL — ABNORMAL LOW (ref 6.5–8.1)

## 2020-06-24 LAB — PROTIME-INR
INR: 1.4 — ABNORMAL HIGH (ref 0.8–1.2)
Prothrombin Time: 16.6 seconds — ABNORMAL HIGH (ref 11.4–15.2)

## 2020-06-24 LAB — GLUCOSE, CAPILLARY
Glucose-Capillary: 79 mg/dL (ref 70–99)
Glucose-Capillary: 51 mg/dL — ABNORMAL LOW (ref 70–99)
Glucose-Capillary: 124 mg/dL — ABNORMAL HIGH (ref 70–99)
Glucose-Capillary: 81 mg/dL (ref 70–99)
Glucose-Capillary: 50 mg/dL — ABNORMAL LOW (ref 70–99)
Glucose-Capillary: 80 mg/dL (ref 70–99)
Glucose-Capillary: 58 mg/dL — ABNORMAL LOW (ref 70–99)
Glucose-Capillary: 101 mg/dL — ABNORMAL HIGH (ref 70–99)
Glucose-Capillary: 59 mg/dL — ABNORMAL LOW (ref 70–99)

## 2020-06-24 LAB — APTT: aPTT: 30 seconds (ref 24–36)

## 2020-06-24 LAB — PREPARE RBC (CROSSMATCH)

## 2020-06-24 MED ORDER — DEXTROSE 50 % IV SOLN
INTRAVENOUS | Status: AC
  Administered 2020-06-24: 50 mL
  Filled 2020-06-24: qty 50

## 2020-06-24 MED ORDER — OXYCODONE HCL 5 MG PO TABS
5.0000 mg | ORAL_TABLET | ORAL | Status: DC | PRN
  Administered 2020-06-26 – 2020-06-28 (×3): 5 mg via ORAL
  Filled 2020-06-24 (×3): qty 1

## 2020-06-24 MED ORDER — SODIUM CHLORIDE 0.9% IV SOLUTION: Freq: Once | INTRAVENOUS | Status: AC

## 2020-06-24 MED ORDER — DEXTROSE 50 % IV SOLN
12.5000 g | INTRAVENOUS | Status: AC
  Administered 2020-06-24: 12.5 g via INTRAVENOUS

## 2020-06-24 MED ORDER — DEXTROSE 50 % IV SOLN
INTRAVENOUS | Status: AC
  Filled 2020-06-24: qty 50

## 2020-06-24 MED ORDER — INSULIN DETEMIR 100 UNIT/ML ~~LOC~~ SOLN
7.0000 [IU] | Freq: Two times a day (BID) | SUBCUTANEOUS | Status: DC
  Administered 2020-06-25 – 2020-06-29 (×9): 7 [IU] via SUBCUTANEOUS
  Filled 2020-06-24 (×12): qty 0.07

## 2020-06-24 MED ORDER — ALBUMIN HUMAN 25 % IV SOLN
25.0000 g | Freq: Four times a day (QID) | INTRAVENOUS | Status: DC
  Administered 2020-06-24: 25 g via INTRAVENOUS
  Filled 2020-06-24: qty 100

## 2020-06-24 MED ORDER — MORPHINE SULFATE (PF) 2 MG/ML IV SOLN: 2.0000 mg | INTRAVENOUS | Status: DC | PRN

## 2020-06-24 MED ORDER — ALBUMIN HUMAN 25 % IV SOLN
25.0000 g | Freq: Four times a day (QID) | INTRAVENOUS | Status: AC
  Administered 2020-06-24 – 2020-06-25 (×3): 25 g via INTRAVENOUS
  Filled 2020-06-24 (×4): qty 100

## 2020-06-24 MED ORDER — POTASSIUM CHLORIDE 10 MEQ/100ML IV SOLN
10.0000 meq | INTRAVENOUS | Status: AC
  Administered 2020-06-24 – 2020-06-25 (×4): 10 meq via INTRAVENOUS
  Filled 2020-06-24 (×4): qty 100

## 2020-06-24 NOTE — Progress Notes (Signed)
Hypoglycemic Event  CBG: 50  Treatment: D50 50 mL (25 gm)  Symptoms: None  Follow-up CBG: Time:1655 CBG Result:80  Possible Reasons for Event: Inadequate meal intake (Pt is NPO)  Comments/MD notified: [place], [place] R Delmas Faucett

## 2020-06-24 NOTE — Progress Notes (Addendum)
Hypoglycemic Event  CBG: 58  Treatment: 12.5 dextrose   Symptoms: None  Follow-up CBG: Time:2045 CBG Result:101  Possible Reasons for Event: npo  Comments/MD notified:    Clancy Gourd

## 2020-06-24 NOTE — Progress Notes (Signed)
[facility] Progress Note  3 Days Post-Op  Subjective: Awake in bed and looking good. Wants the NG out. Doing some sips and ice. He has no gas in the ostomy. No major pain complaints.   Objective: Vital signs in last 24 hours: Temp:  [97.9 F (36.6 C)-98.1 F (36.7 C)] 98 F (36.7 C) (08/07 0900) Pulse Rate:  [87-105] 100 (08/07 0700) Resp:  [11-28] 28 (08/07 0700) SpO2:  [93 %-100 %] 99 % (08/07 0700) Weight:  [125.6 kg] 125.6 kg (08/07 0445) Last BM Date: 06/20/20  Intake/Output from previous day: 08/06 0701 - 08/07 0700 In: 3541.7 [P.O.:360; I.V.:2919.2; IV Piggyback:262.5] Out: 3000 [Urine:1550; Emesis/NG output:1450] Intake/Output this shift: Total I/O In: -  Out: 900 [Emesis/NG output:900]  General appearance: alert, cooperative and no distress Resp: normal work of breathing GI: soft, obese, midline c/d/i with d ressing, ostomy with edema and bowel sweat, no gas  Lab Results:  Recent Labs    06/23/20 0459 06/24/20 0514  WBC 15.8* 12.9*  HGB 8.3* 7.0*  HCT 26.2* 22.6*  PLT 317 207   BMET Recent Labs    06/23/20 0459 06/24/20 0514  NA 135 142  K 4.2 3.2*  CL 102 109  CO2 21* 25  GLUCOSE 281* 72  BUN 37* 37*  CREATININE 2.01* 2.04*  CALCIUM 7.0* 7.5*   PT/INR Recent Labs    06/24/20 0916  LABPROT 16.6*  INR 1.4*    Studies/Results: DG Chest Port 1 View  Result Date: 06/23/2020 CLINICAL DATA:  Acute respiratory failure. EXAM: PORTABLE CHEST 1 VIEW COMPARISON:  06/22/2020.  CT 06/21/2020. FINDINGS: Endotracheal tube, NG tube, right IJ line stable position. Stable cardiomegaly. Low lung volumes with bibasilar atelectasis. No pleural effusion or pneumothorax. Free air in the right hemidiaphragm best identified by prior CT. IMPRESSION: 1.  Endotracheal tube, NG tube, right IJ line stable position. 2.  Stable cardiomegaly. 3. Low lung volumes with bibasilar atelectasis again noted. Chest is stable from prior exam. 4. Free air under the  right hemidiaphragm best identified by prior CT. Electronically Signed   By: Marcello Moores  Register   On: 06/23/2020 07:00    Anti-infectives: Anti-infectives (From admission, onward)   Start     Dose/Rate Route Frequency Ordered Stop   06/22/20 0030  piperacillin-tazobactam (ZOSYN) IVPB 3.375 g     Discontinue     3.375 g 12.5 mL/hr over 240 Minutes Intravenous Every 8 hours 06/21/20 1953     06/21/20 1830  cefoTEtan (CEFOTAN) 2 g in sodium chloride 0.9 % 100 mL IVPB        2 g 200 mL/hr over 30 Minutes Intravenous On call to O.R. 06/21/20 1819 06/21/20 2002   06/21/20 1830  fluconazole (DIFLUCAN) IVPB 400 mg  Status:  Discontinued        400 mg 100 mL/hr over 120 Minutes Intravenous Every 24 hours 06/21/20 1828 06/21/20 2340   06/21/20 1630  piperacillin-tazobactam (ZOSYN) IVPB 3.375 g        3.375 g 100 mL/hr over 30 Minutes Intravenous  Once 06/21/20 1615 06/21/20 1751      Assessment/Plan: Mr. Putzier is a 79 yo pod 3 s/p Ex lap, partial colectomy end colostomy for colonic perforation from diverticulitis perforation with abscess. Doing much better.  PRN for pain IS, deep breathing HD improved NPO, NG can have some sips/ ice Ostomy RN ordered, ostomy a little dusky/ edematous H&H drifting, another 1u PRBC today  Zosyn for intraperitoneal contamination/ diverticulitis, can stop on POD  5-  06/27/2020  SCDs, holding heparin gtt until H&H more stable Updated family at bedside Wound change BID UOP improving, Cr stabilzing now, hopefully will come down in next few days  Discussed with Dr. Roderic Palau.  Up in chair/ mobilize.    LOS: 3 days    Virl Cagey 06/24/2020

## 2020-06-24 NOTE — Progress Notes (Signed)
Pharmacy Antibiotic Note  Vincent Cooper is a 79 y.o. male admitted on 06/21/2020 with bowel perforation.  Pharmacy has been consulted for Zosyn dosing.  WBC 12.9, afebrile, CrCl 39 ml/min  Plan: Zosyn 3.375 gm IV Q 8 hrs (extended infusion) Monitor WBC, temp, clinical improvement, renal function  Height: 5\' 9"  (175.3 cm) Weight: 125.6 kg (276 lb 14.4 oz) IBW/kg (Calculated) : 70.7  Temp (24hrs), Avg:97.9 F (36.6 C), Min:97.8 F (36.6 C), Max:98.1 F (36.7 C)  Recent Labs  Lab 06/19/20 0629 06/20/20 0508 06/21/20 1240 06/22/20 0510 06/22/20 1544 06/22/20 1920 06/23/20 0459 06/24/20 0514  WBC 9.4  --  13.8* 9.2  --   --  15.8* 12.9*  CREATININE 1.21   < > 1.19 1.40* 1.69*  --  2.01* 2.04*  LATICACIDVEN  --   --   --   --  2.3* 1.8  --   --    < > = values in this interval not displayed.    Estimated Creatinine Clearance: 39.1 mL/min (A) (by C-G formula based on SCr of 2.04 mg/dL (H)).    Allergies  Allergen Reactions  . Other     Patient reports he was allergic to something in an IV he was given but does not know what the substance was. As of 01/19/2020     Antimicrobials this admission: 8/4 cefotetan X 1 8/4 Zosyn >> 8/4 fluconazole IV x1  Microbiology results: 8/4 COVID: negative  Thank you for allowing pharmacy to be a part of this patient's care.  Thomasenia Sales, PharmD, MBA, BCGP Clinical Pharmacist  06/24/2020 11:39 AM

## 2020-06-24 NOTE — Progress Notes (Signed)
Patient Demographics:    Akiel Fennell, is a 79 y.o. male, DOB - [birth year], MGQ:[id number]  Admit date - 06/21/2020   Admitting Physician Albertine Patricia, MD  Outpatient Primary MD for the patient is [name], DO  LOS - 3   Chief Complaint  Patient presents with  . Abdominal Pain        Subjective:    Keonta Esperanza sitting up in bed, denies any significant pain or shortness of breath.  No bowel movements or gas noted in ostomy.  Wants to start p.o. diet.   Assessment  & Plan :    Principal Problem:   Bowel perforation (HCC) Active Problems:   COPD (chronic obstructive pulmonary disease) (HCC)   Diabetes mellitus type II, non insulin dependent (HCC)   OSA and COPD overlap syndrome (HCC)   Essential hypertension   Morbid obesity (HCC)   Chronic diastolic HF (heart failure) (HCC)   Coronary artery disease, non-occlusive   Hyperlipidemia with target LDL less than 70   Free intraperitoneal air   Brief Summary:- 79 year old obese man with dm2, htn, cad, COPD, H/o PE on Apixaban admitted on 06/21/2020 with abdominal pain and found to have colonic perforation due to fishbone impaction and underwent partial colectomy, end colostomy emergently on 06/21/2020, postop patient had persistent hypotension most likely due to severe sepsis with septic shock -Hemodynamics improved patient weaned off pressors on 06/23/2020, off vent and extubated on 7 06/23/2020  A/p 1)Severe sepsis with septic shock--surgical pathology suggest diverticulitis with bowel perforation and abscess resulting in fecal peritonitis peritonitis -and severe sepsis with septic shock --Sepsis pathophysiology appears to have resolved as of 06/23/2020 -Hypotension resolved, discontinued Neo-Synephrine, discontinue vasopressin and discontinued Levophed on 06/23/2020 --- -lactic acid is 2.1 >>2.3>>1.8 ---Continue IV Zosyn started 06/21/2020  2)New  onset A. fib with RVR --tachyarrhythmia in the setting of severe sepsis and septic shock--- did not respond to IV metoprolol, continue on IV amiodarone until able to take oral intake at which time consider transitioning to p.o. Cardizem last known EF was 60 to 65% based on echo from December 2020.  Can consider starting anticoagulation once stable from surgical standpoint  3) history of PE--- diagnosed in December 2020-- PTA was on Eliquis currently on hold perioperatively, restart heparin without bolus on 06/24/2020 aspirin general surgeon -Received Kcentra preop -Okay to use subcu heparin for DVT prophylaxis at this time  4)DM2- Use Novolog/Humalog Sliding scale insulin with Accu-Cheks/Fingersticks as ordered -Patient currently n.p.o. so avoid over aggressive diabetic control  5)Obesity/OSA--- history of poor compliance with CPAP PTA,   6) acute symptomatic anemia due to acute blood loss--- baseline hemoglobin of 10 prior to surgery.  Since surgery, he has required transfusion of 1 unit of PRBC which is thought related to blood loss.  This morning, hemoglobin noted to be 7.0.  Will transfuse another unit of PRBC today.    7)AKI----acute kidney injury due to reduced renal perfusion in the setting of persistent hypotension in the setting of severe sepsis with septic shock compounded by lisinopril, torsemide and Metformin use PTA -   creatinine on admission= 1.0 , baseline creatinine = 1.0   , creatinine is now= 2.0 (new peak) which may be plateauing.  renally adjust medications, avoid nephrotoxic  agents / dehydration  / hypotension -Lisinopril, torsemide and Metformin have been discontinued   Time spent: 35 mins  Disposition/Need for in-Hospital Stay- patient unable to be discharged at this time due to ----awaiting return of bowel function and tolerance of oral intake--- currently requiring IV fluids Status is: Inpatient  Remains inpatient appropriate because:awaiting return of bowel function  and tolerance of oral intake--- currently requiring IV fluids   Disposition: The patient is from: Home              Anticipated d/c is to: TBD              Anticipated d/c date is: > 3 days              Patient currently is not medically stable to d/c. Barriers: Not Clinically Stable- -awaiting return of bowel function and tolerance of oral intake--- currently requiring IV fluids  Procedures:- 8/4 partial colectomy, end colostomy, laparotomy 8/4 RIJ >> 8/4 ETT >> -Extubated 06/23/2020  Code Status : FULL   Family Communication:  Discussed with daughter at the bedside Consults  :  Gen surg/PCCM  DVT Prophylaxis  :   - SCDs /Heparin sq  Lab Results  Component Value Date   PLT 207 06/24/2020    Inpatient Medications  Scheduled Meds: . sodium chloride   Intravenous Once  . Chlorhexidine Gluconate Cloth  6 each Topical Daily  . heparin injection (subcutaneous)  5,000 Units Subcutaneous Q8H  . insulin aspart  0-15 Units Subcutaneous Q4H  . insulin detemir  15 Units Subcutaneous BID  . levalbuterol  0.63 mg Nebulization Q6H  . pantoprazole (PROTONIX) IV  40 mg Intravenous Q12H   Continuous Infusions: . albumin human    . amiodarone 30 mg/hr (06/24/20 1657)  . piperacillin-tazobactam (ZOSYN)  IV 12.5 mL/hr at 06/24/20 1522   PRN Meds:.metoprolol tartrate, morphine injection, ondansetron **OR** ondansetron (ZOFRAN) IV, oxyCODONE    Anti-infectives (From admission, onward)   Start     Dose/Rate Route Frequency Ordered Stop   06/22/20 0030  piperacillin-tazobactam (ZOSYN) IVPB 3.375 g     Discontinue     3.375 g 12.5 mL/hr over 240 Minutes Intravenous Every 8 hours 06/21/20 1953     06/21/20 1830  cefoTEtan (CEFOTAN) 2 g in sodium chloride 0.9 % 100 mL IVPB        2 g 200 mL/hr over 30 Minutes Intravenous On call to O.R. 06/21/20 1819 06/21/20 2002   06/21/20 1830  fluconazole (DIFLUCAN) IVPB 400 mg  Status:  Discontinued        400 mg 100 mL/hr over 120 Minutes  Intravenous Every 24 hours 06/21/20 1828 06/21/20 2340   06/21/20 1630  piperacillin-tazobactam (ZOSYN) IVPB 3.375 g        3.375 g 100 mL/hr over 30 Minutes Intravenous  Once 06/21/20 1615 06/21/20 1751        Objective:   Vitals:   06/24/20 1500 06/24/20 1529 06/24/20 1600 06/24/20 1700  BP: (!) 108/42  (!) 124/41 130/62  Pulse: 90  86 91  Resp: 13  13 (!) 21  Temp:  97.7 F (36.5 C)    TempSrc:  Axillary    SpO2: (!) 88%  90% 93%  Weight:      Height:        Wt Readings from Last 3 Encounters:  06/24/20 125.6 kg  06/20/20 115 kg  03/20/20 117.9 kg     Intake/Output Summary (Last 24 hours) at 06/24/2020 1834 Last data filed  at 06/24/2020 1700 Gross per 24 hour  Intake 2419.16 ml  Output 4250 ml  Net -1830.84 ml    Physical Exam  General exam: Alert, awake, oriented x 3 Respiratory system: Clear to auscultation. Respiratory effort normal. Cardiovascular system: Irregular no murmurs, rubs, gallops. Gastrointestinal system: Abdomen is nondistended, soft and nontender. No organomegaly or masses felt. Normal bowel sounds heard.  Ostomy left lower quadrant with bloody material.  Without gas or fecal material Central nervous system: Alert and oriented. No focal neurological deficits. Extremities: 2-3+ edema bilaterally in LE Skin: No rashes, lesions or ulcers Psychiatry: Judgement and insight appear normal. Mood & affect appropriate.     dMicro Results Recent Results (from the past 240 hour(s))  SARS Coronavirus 2 by RT PCR (hospital order, performed in [facility] hospital lab) Nasopharyngeal Nasopharyngeal Swab     Status: None   Collection Time: 06/17/20  1:15 PM   Specimen: Nasopharyngeal Swab  Result Value Ref Range Status   SARS Coronavirus 2 NEGATIVE NEGATIVE Final    Comment: (NOTE) SARS-CoV-2 target nucleic acids are NOT DETECTED.  The SARS-CoV-2 RNA is generally detectable in upper and lower respiratory specimens during the acute phase of infection. The  lowest concentration of SARS-CoV-2 viral copies this assay can detect is 250 copies / mL. A negative result does not preclude SARS-CoV-2 infection and should not be used as the sole basis for treatment or other patient management decisions.  A negative result may occur with improper specimen collection / handling, submission of specimen other than nasopharyngeal swab, presence of viral mutation(s) within the areas targeted by this assay, and inadequate number of viral copies (<250 copies / mL). A negative result must be combined with clinical observations, patient history, and epidemiological information.  Fact Sheet for Patients:   StrictlyIdeas.no  Fact Sheet for Healthcare Providers: BankingDealers.co.za  This test is not yet approved or  cleared by the [place] FDA and has been authorized for detection and/or diagnosis of SARS-CoV-2 by FDA under an Emergency Use Authorization (EUA).  This EUA will remain in effect (meaning this test can be used) for the duration of the COVID-19 declaration under Section 564(b)(1) of the Act, 21 U.S.C. section 360bbb-3(b)(1), unless the authorization is terminated or revoked sooner.  Performed at [facility], [address], [place], [place] [address]   SARS Coronavirus 2 by RT PCR (hospital order, performed in [facility] hospital lab) Nasopharyngeal Nasopharyngeal Swab     Status: None   Collection Time: 06/21/20  1:47 PM   Specimen: Nasopharyngeal Swab  Result Value Ref Range Status   SARS Coronavirus 2 NEGATIVE NEGATIVE Final    Comment: (NOTE) SARS-CoV-2 target nucleic acids are NOT DETECTED.  The SARS-CoV-2 RNA is generally detectable in upper and lower respiratory specimens during the acute phase of infection. The lowest concentration of SARS-CoV-2 viral copies this assay can detect is 250 copies / mL. A negative result does not preclude SARS-CoV-2 infection and should not be used as  the sole basis for treatment or other patient management decisions.  A negative result may occur with improper specimen collection / handling, submission of specimen other than nasopharyngeal swab, presence of viral mutation(s) within the areas targeted by this assay, and inadequate number of viral copies (<250 copies / mL). A negative result must be combined with clinical observations, patient history, and epidemiological information.  Fact Sheet for Patients:   StrictlyIdeas.no  Fact Sheet for Healthcare Providers: BankingDealers.co.za  This test is not yet approved or  cleared by the [place] and has been authorized for detection and/or diagnosis of SARS-CoV-2 by FDA under an Emergency Use Authorization (EUA).  This EUA will remain in effect (meaning this test can be used) for the duration of the COVID-19 declaration under Section 564(b)(1) of the Act, 21 U.S.C. section 360bbb-3(b)(1), unless the authorization is terminated or revoked sooner.  Performed at [facility], [address], [place], [place] [address]   MRSA PCR Screening     Status: None   Collection Time: 06/21/20 11:31 PM   Specimen: Nasal Mucosa; Nasopharyngeal  Result Value Ref Range Status   MRSA by PCR NEGATIVE NEGATIVE Final    Comment:        The GeneXpert MRSA Assay (FDA approved for NASAL specimens only), is one component of a comprehensive MRSA colonization surveillance program. It is not intended to diagnose MRSA infection nor to guide or monitor treatment for MRSA infections. Performed at [facility], [address] [place], [place] [address]     Radiology Reports CT ABDOMEN PELVIS W CONTRAST  Result Date: 06/21/2020 CLINICAL DATA:  Acute left lower quadrant abdominal pain. EXAM: CT ABDOMEN AND PELVIS WITH CONTRAST TECHNIQUE: Multidetector CT imaging of the abdomen and pelvis was performed using the standard protocol following bolus  administration of intravenous contrast. CONTRAST:  167mL OMNIPAQUE IOHEXOL 300 MG/ML  SOLN COMPARISON:  None. FINDINGS: Lower chest: No acute abnormality. Hepatobiliary: Solitary gallstone is noted. No biliary dilatation is noted. The liver is unremarkable. Pancreas: Unremarkable. No pancreatic ductal dilatation or surrounding inflammatory changes. Spleen: Normal in size without focal abnormality. Adrenals/Urinary Tract: Adrenal glands are unremarkable. Kidneys are normal, without renal calculi, focal lesion, or hydronephrosis. Bladder is unremarkable. Stomach/Bowel: Moderate amount of free air is noted in the peritoneal space, particularly in the epigastric region. This is concerning for rupture of hollow viscus. However the source is not clearly identified. The stomach and appendix are unremarkable. There is no evidence of bowel obstruction or inflammation. Sigmoid diverticulosis is noted. Vascular/Lymphatic: Aortic atherosclerosis. No enlarged abdominal or pelvic lymph nodes. Reproductive: Prostate is unremarkable. Other: No abdominal wall hernia or abnormality. No abdominopelvic ascites. Musculoskeletal: No acute or significant osseous findings. IMPRESSION: 1. Moderate amount of free air is noted in the peritoneal space, particularly in the epigastric region. This is concerning for rupture of hollow viscus. However the source is not clearly identified. Critical Value/emergent results were called by telephone at the time of interpretation on 06/21/2020 at 3:45 pm to provider ABIGAIL HARRIS , who verbally acknowledged these results. 2. Sigmoid diverticulosis without inflammation. 3. Solitary gallstone. Aortic Atherosclerosis (ICD10-I70.0). Electronically Signed   By: Marijo Conception M.D.   On: 06/21/2020 15:47   US Venous Img Lower Bilateral  Result Date: 06/17/2020 CLINICAL DATA:  Edema EXAM: BILATERAL LOWER EXTREMITY VENOUS DOPPLER ULTRASOUND TECHNIQUE: Gray-scale sonography with compression, as well as color  and duplex ultrasound, were performed to evaluate the deep venous system(s) from the level of the common femoral vein through the popliteal and proximal calf veins. COMPARISON:  None. FINDINGS: VENOUS Normal compressibility of the common femoral, superficial femoral, and popliteal veins, as well as the visualized calf veins. Visualized portions of profunda femoral vein and great saphenous vein unremarkable. No filling defects to suggest DVT on grayscale or color Doppler imaging. Doppler waveforms show normal direction of venous flow, normal respiratory plasticity and response to augmentation. Limited views of the contralateral common femoral vein are unremarkable. OTHER Ill-defined fluid/edema within the subcutaneous soft tissues of both calves.  Limitations: none IMPRESSION: 1. No DVT seen, bilateral lower extremities. 2. Fluid/edema within the subcutaneous soft tissues of both calves. Electronically Signed   By: Franki Cabot M.D.   On: 06/17/2020 14:10   DG Chest Port 1 View  Result Date: 06/23/2020 CLINICAL DATA:  Acute respiratory failure. EXAM: PORTABLE CHEST 1 VIEW COMPARISON:  06/22/2020.  CT 06/21/2020. FINDINGS: Endotracheal tube, NG tube, right IJ line stable position. Stable cardiomegaly. Low lung volumes with bibasilar atelectasis. No pleural effusion or pneumothorax. Free air in the right hemidiaphragm best identified by prior CT. IMPRESSION: 1.  Endotracheal tube, NG tube, right IJ line stable position. 2.  Stable cardiomegaly. 3. Low lung volumes with bibasilar atelectasis again noted. Chest is stable from prior exam. 4. Free air under the right hemidiaphragm best identified by prior CT. Electronically Signed   By: Marcello Moores  Register   On: 06/23/2020 07:00   DG Chest Port 1 View  Result Date: 06/22/2020 CLINICAL DATA:  Post intubation EXAM: PORTABLE CHEST 1 VIEW COMPARISON:  06/21/2020, CT 03/07/2020 FINDINGS: Interval intubation, tip of the endotracheal tube is about 14 mm superior to the carina.  Right-sided central venous catheter tip over the cavoatrial region. Esophageal tube tip below the diaphragm but incompletely visualized. Cardiomegaly with aortic atherosclerosis. No pleural effusion or consolidation. Minimal lucency at the right diaphragm could reflect small free air demonstrated on CT. IMPRESSION: 1. Interval intubation with tip of the endotracheal tube about 14 mm superior to the carina. Right IJ central venous catheter tip over the cavoatrial region. No pneumothorax. 2. Cardiomegaly. No edema or infiltrate. 3. Possible small amount of free air beneath the right diaphragm, demonstrated on previous CT. Electronically Signed   By: Donavan Foil M.D.   On: 06/22/2020 00:14   DG Chest Port 1 View  Result Date: 06/21/2020 CLINICAL DATA:  Shortness of breath.  History of COPD. EXAM: PORTABLE CHEST 1 VIEW COMPARISON:  Single-view of the chest 06/17/2020 and 11/13/2019. CT chest 11/06/2019. FINDINGS: The lungs are emphysematous. Mild subsegmental atelectasis is seen in the lung bases. The lungs are otherwise clear. Heart size is normal. Aortic atherosclerosis. No pneumothorax or pleural fluid. No acute bony abnormality. IMPRESSION: No acute disease. Aortic Atherosclerosis (ICD10-I70.0) and Emphysema (ICD10-J43.9). Electronically Signed   By: Inge Rise M.D.   On: 06/21/2020 13:58   DG Chest Port 1 View  Result Date: 06/17/2020 CLINICAL DATA:  Bilateral foot swelling EXAM: PORTABLE CHEST 1 VIEW COMPARISON:  03/17/2020 FINDINGS: Stable mild cardiomegaly. Atherosclerotic calcification of the aortic knob. Mildly hyperexpanded lungs coarsened bibasilar interstitial markings. No focal airspace consolidation. No overt edema. No significant pleural fluid collection. No pneumothorax. IMPRESSION: Stable mild cardiomegaly. No overt edema or focal airspace consolidation. Electronically Signed   By: Davina Poke D.O.   On: 06/17/2020 12:30     CBC Recent Labs  Lab 06/19/20 0629 06/19/20 0629  06/21/20 1240 06/22/20 0510 06/22/20 1544 06/23/20 0459 06/24/20 0514  WBC 9.4  --  13.8* 9.2  --  15.8* 12.9*  HGB 9.6*   < > 10.6* 7.3* 9.6* 8.3* 7.0*  HCT 29.1*   < > 32.9* 23.0* 30.5* 26.2* 22.6*  PLT 308  --  415* PLT CLUMPING NOTED, COLLECT IN CITRATE FOR CBC  --  317 207  MCV 101.4*  --  102.2* 105.0*  --  98.9 101.3*  MCH 33.4  --  32.9 33.3  --  31.3 31.4  MCHC 33.0  --  32.2 31.7  --  31.7 31.0  RDW 17.8*  --  17.8* 18.3*  --  18.6* 17.9*  LYMPHSABS  --   --   --  1.6  --   --   --   MONOABS  --   --   --  1.1*  --   --   --   EOSABS  --   --   --  0.0  --   --   --   BASOSABS  --   --   --  0.0  --   --   --    < > = values in this interval not displayed.    Chemistries  Recent Labs  Lab 06/18/20 0603 06/19/20 0629 06/21/20 1240 06/22/20 0510 06/22/20 1544 06/23/20 0459 06/24/20 0514  NA 137   < > 137 137 133* 135 142  K 2.7*   < > 3.8 4.2 4.3 4.2 3.2*  CL 96*   < > 99 102 102 102 109  CO2 28   < > 25 24 20* 21* 25  GLUCOSE 105*   < > 202* 221* 243* 281* 72  BUN 16   < > 37* 31* 37* 37* 37*  CREATININE 1.16   < > 1.19 1.40* 1.69* 2.01* 2.04*  CALCIUM 8.5*   < > 8.4* 7.4* 7.3* 7.0* 7.5*  MG 2.0  --   --  1.6*  --  2.2  --   AST  --   --  18 18  --   --  25  ALT  --   --  24 21  --   --  24  ALKPHOS  --   --  48 27*  --   --  37*  BILITOT  --   --  1.0 0.8  --   --  0.6   < > = values in this interval not displayed.   ------------------------------------------------------------------------------------------------------------------ Recent Labs    06/22/20 0052  TRIG 151*    Lab Results  Component Value Date   HGBA1C 5.4 06/17/2020   ------------------------------------------------------------------------------------------------------------------ No results for input(s): TSH, T4TOTAL, T3FREE, THYROIDAB in the last 72 hours.  Invalid input(s):  FREET3 ------------------------------------------------------------------------------------------------------------------ No results for input(s): VITAMINB12, FOLATE, FERRITIN, TIBC, IRON, RETICCTPCT in the last 72 hours.  Coagulation profile Recent Labs  Lab 06/24/20 0916  INR 1.4*    No results for input(s): DDIMER in the last 72 hours.  Cardiac Enzymes No results for input(s): CKMB, TROPONINI, MYOGLOBIN in the last 168 hours.  Invalid input(s): CK ------------------------------------------------------------------------------------------------------------------    Component Value Date/Time   BNP 87.0 06/17/2020 1134    Kathie Dike M.D on 06/24/2020 at 6:34 PM  Go to [email or web address] - for contact info  [facility] - Office  [phone]

## 2020-06-24 NOTE — Progress Notes (Signed)
Upon morning assessment noticed arterial line was no longer in the right radial position. There was little to no blood loss. Patient in no distress. Dressing on IJ also changed.

## 2020-06-24 NOTE — Progress Notes (Signed)
Hypoglycemic Event  CBG: 51  Treatment: One amp of D50 given.   Symptoms: No symptoms present  Follow-up CBG: Time: 0852 CBG Result:126  Possible Reasons for Event: Patient is NPO  Comments/MD notified: Roderic Palau, MD    Eric Form

## 2020-06-24 NOTE — Progress Notes (Signed)
Spoke with Dr. Constance Haw about progressing patient's diet per patient's request. Pt has not wanted to get up in chair or ambulate any. Educated him on the importance of this and how it can prevent pneumonia and help get gas and stool to start forming in his ostomy as it is still just blood coming out. Dr. Constance Haw said patient can have one italian ice per shift for comfort. Pt stated he will start to get in the chair starting tonight or first thing in the morning. Dr Constance Haw made aware. Will continue to monitor.

## 2020-06-24 NOTE — Progress Notes (Signed)
Pt did decide he wanted to get up in the chair as he wants to be able to advance his diet and does not want to develop pneumonia after extensive education from RN and MD. Will pass on to night shift RN.

## 2020-06-25 LAB — TYPE AND SCREEN
ABO/RH(D): O POS
Antibody Screen: NEGATIVE
Unit division: 0
Unit division: 0

## 2020-06-25 LAB — GLUCOSE, CAPILLARY
Glucose-Capillary: 122 mg/dL — ABNORMAL HIGH (ref 70–99)
Glucose-Capillary: 53 mg/dL — ABNORMAL LOW (ref 70–99)
Glucose-Capillary: 58 mg/dL — ABNORMAL LOW (ref 70–99)
Glucose-Capillary: 63 mg/dL — ABNORMAL LOW (ref 70–99)
Glucose-Capillary: 72 mg/dL (ref 70–99)
Glucose-Capillary: 74 mg/dL (ref 70–99)
Glucose-Capillary: 82 mg/dL (ref 70–99)
Glucose-Capillary: 86 mg/dL (ref 70–99)

## 2020-06-25 LAB — BPAM RBC
Blood Product Expiration Date: 202109062359
Blood Product Expiration Date: 202109062359
ISSUE DATE / TIME: 202108051013
Unit Type and Rh: 5100
Unit Type and Rh: 5100

## 2020-06-25 LAB — RENAL FUNCTION PANEL
Albumin: 2.3 g/dL — ABNORMAL LOW (ref 3.5–5.0)
Anion gap: 12 (ref 5–15)
BUN: 32 mg/dL — ABNORMAL HIGH (ref 8–23)
CO2: 24 mmol/L (ref 22–32)
Calcium: 8 mg/dL — ABNORMAL LOW (ref 8.9–10.3)
Chloride: 109 mmol/L (ref 98–111)
Creatinine, Ser: 1.94 mg/dL — ABNORMAL HIGH (ref 0.61–1.24)
GFR calc Af Amer: 37 mL/min — ABNORMAL LOW (ref >60)
GFR calc non Af Amer: 32 mL/min — ABNORMAL LOW (ref >60)
Glucose, Bld: 96 mg/dL (ref 70–99)
Phosphorus: 3.6 mg/dL (ref 2.5–4.6)
Potassium: 3.1 mmol/L — ABNORMAL LOW (ref 3.5–5.1)
Sodium: 145 mmol/L (ref 135–145)

## 2020-06-25 LAB — CBC
HCT: 23.5 % — ABNORMAL LOW (ref 39.0–52.0)
Hemoglobin: 7.4 g/dL — ABNORMAL LOW (ref 13.0–17.0)
MCH: 31.5 pg (ref 26.0–34.0)
MCHC: 31.5 g/dL (ref 30.0–36.0)
MCV: 100 fL (ref 80.0–100.0)
Platelets: 169 10*3/uL (ref 150–400)
RBC: 2.35 MIL/uL — ABNORMAL LOW (ref 4.22–5.81)
RDW: 17.3 % — ABNORMAL HIGH (ref 11.5–15.5)
WBC: 8.6 10*3/uL (ref 4.0–10.5)
nRBC: 0 % (ref 0.0–0.2)

## 2020-06-25 LAB — PREPARE RBC (CROSSMATCH)

## 2020-06-25 LAB — MAGNESIUM: Magnesium: 2.3 mg/dL (ref 1.7–2.4)

## 2020-06-25 MED ORDER — LEVALBUTEROL HCL 0.63 MG/3ML IN NEBU
0.6300 mg | INHALATION_SOLUTION | Freq: Four times a day (QID) | RESPIRATORY_TRACT | Status: DC
  Administered 2020-06-25 – 2020-06-28 (×12): 0.63 mg via RESPIRATORY_TRACT
  Filled 2020-06-25 (×12): qty 3

## 2020-06-25 MED ORDER — DEXTROSE 50 % IV SOLN
25.0000 g | INTRAVENOUS | Status: AC
  Administered 2020-06-25: 25 g via INTRAVENOUS
  Filled 2020-06-25: qty 50

## 2020-06-25 MED ORDER — POTASSIUM CHLORIDE 10 MEQ/100ML IV SOLN
10.0000 meq | INTRAVENOUS | Status: AC
  Administered 2020-06-25 (×4): 10 meq via INTRAVENOUS
  Filled 2020-06-25 (×4): qty 100

## 2020-06-25 MED ORDER — FUROSEMIDE 10 MG/ML IJ SOLN
20.0000 mg | Freq: Two times a day (BID) | INTRAMUSCULAR | Status: DC
  Administered 2020-06-25 – 2020-06-29 (×9): 20 mg via INTRAVENOUS
  Filled 2020-06-25 (×10): qty 2

## 2020-06-25 MED ORDER — SODIUM CHLORIDE 0.9% IV SOLUTION: Freq: Once | INTRAVENOUS | Status: AC

## 2020-06-25 MED ORDER — DEXTROSE 50 % IV SOLN
INTRAVENOUS | Status: AC
  Filled 2020-06-25: qty 50

## 2020-06-25 MED ORDER — DEXTROSE 50 % IV SOLN
INTRAVENOUS | Status: AC
  Administered 2020-06-25: 50 mL via INTRAVENOUS
  Filled 2020-06-25: qty 50

## 2020-06-25 NOTE — Progress Notes (Signed)
Hypoglycemic Event  CBG: 63  Treatment: D50 25 mL (12.5 gm)  Symptoms: Hungry  Follow-up CBG: Time:1500 CBG Result:74  Possible Reasons for Event: Inadequate meal intake  Comments/MD notified: Memon, MD.   *Pt was sleeping when we went in to check glucose at an earlier time.*  Vincent Cooper

## 2020-06-25 NOTE — Progress Notes (Signed)
Pt sat up in the chair for approx. 4 hours today. Tolerated well.

## 2020-06-25 NOTE — Progress Notes (Signed)
Patient Demographics:    Vincent Cooper, is a 79 y.o. male, DOB - 1941-08-31, ZDG:[id number]  Admit date - 06/21/2020   Admitting Physician Albertine Patricia, MD  Outpatient Primary MD for the patient is [name], DO  LOS - 4   Chief Complaint  Patient presents with  . Abdominal Pain        Subjective:    Vincent Cooper denies any abdominal pain, nausea or vomiting.  Has not had any output through ostomy yet.  Denies any shortness of breath.   Assessment  & Plan :    Principal Problem:   Bowel perforation (HCC) Active Problems:   COPD (chronic obstructive pulmonary disease) (HCC)   Diabetes mellitus type II, non insulin dependent (HCC)   OSA and COPD overlap syndrome (HCC)   Essential hypertension   Morbid obesity (HCC)   Chronic diastolic HF (heart failure) (HCC)   Coronary artery disease, non-occlusive   Hyperlipidemia with target LDL less than 70   Free intraperitoneal air   Brief Summary:- 79 year old obese man with dm2, htn, cad, COPD, H/o PE on Apixaban admitted on 06/21/2020 with abdominal pain and found to have colonic perforation due to fishbone impaction and underwent partial colectomy, end colostomy emergently on 06/21/2020, postop patient had persistent hypotension most likely due to severe sepsis with septic shock -Hemodynamics improved patient weaned off pressors on 06/23/2020, off vent and extubated on 7 06/23/2020  A/p 1)Severe sepsis with septic shock--surgical pathology suggest diverticulitis with bowel perforation and abscess resulting in fecal peritonitis peritonitis -and severe sepsis with septic shock --Sepsis pathophysiology appears to have resolved as of 06/23/2020 -Hypotension resolved, discontinued Neo-Synephrine, discontinue vasopressin and discontinued Levophed on 06/23/2020 --- -lactic acid is 2.1 >>2.3>>1.8 ---Continue IV Zosyn started 06/21/2020  2)New onset A. fib with  RVR --tachyarrhythmia in the setting of severe sepsis and septic shock--- did not respond to IV metoprolol, continue on IV amiodarone until able to take oral intake at which time consider transitioning to p.o. Cardizem last known EF was 60 to 65% based on echo from December 2020.  Can consider starting anticoagulation once stable from surgical standpoint  3) history of PE--- diagnosed in December 2020-- PTA was on Eliquis currently on hold perioperatively, restart heparin without bolus on 06/24/2020 aspirin general surgeon -Received Kcentra preop -Okay to use subcu heparin for DVT prophylaxis at this time  4)DM2- Use Novolog/Humalog Sliding scale insulin with Accu-Cheks/Fingersticks as ordered -Patient currently n.p.o. so avoid over aggressive diabetic control  5)Obesity/OSA--- history of poor compliance with CPAP PTA,   6) acute symptomatic anemia due to acute blood loss--- baseline hemoglobin of 10 prior to surgery.  Since surgery, he has required transfusion of 2 unit of PRBC which is thought related to blood loss.  Hemoglobin remains low today.  He will be transfused another unit of PRBC today.  Since INR is mildly elevated, he will also receive 2 units FFP.  7)AKI----acute kidney injury due to reduced renal perfusion in the setting of persistent hypotension in the setting of severe sepsis with septic shock compounded by lisinopril, torsemide and Metformin use PTA -   creatinine on admission= 1.0 , baseline creatinine = 1.0   , creatinine peaked at 2.0.  Currently 1.9  renally adjust medications,  avoid nephrotoxic agents / dehydration  / hypotension -Lisinopril lisinopril is currently on hold -Restarted on IV Lasix due to severe anasarca, continue to follow renal function  8) anasarca.  Patient received IV albumin infusions for hypoalbuminemia.  Will start on IV Lasix and monitor urine output.  9) hypokalemia.  Replace.  Magnesium 2.3.   Time spent: 35 mins  Disposition/Need for  in-Hospital Stay- patient unable to be discharged at this time due to ----awaiting return of bowel function and tolerance of oral intake--- currently requiring IV fluids Status is: Inpatient  Remains inpatient appropriate because:awaiting return of bowel function and tolerance of oral intake--- currently requiring IV fluids   Disposition: The patient is from: Home              Anticipated d/c is to: TBD              Anticipated d/c date is: > 3 days              Patient currently is not medically stable to d/c. Barriers: Not Clinically Stable- -awaiting return of bowel function and tolerance of oral intake--- currently requiring IV fluids  Procedures:- 8/4 partial colectomy, end colostomy, laparotomy 8/4 RIJ >> 8/4 ETT >> -Extubated 06/23/2020  Code Status : FULL   Family Communication:  Discussed with daughter at the bedside Consults  :  Gen surg/PCCM  DVT Prophylaxis  :   - SCDs /Heparin sq  Lab Results  Component Value Date   PLT 169 06/25/2020    Inpatient Medications  Scheduled Meds: . sodium chloride   Intravenous Once  . Chlorhexidine Gluconate Cloth  6 each Topical Daily  . furosemide  20 mg Intravenous BID  . heparin injection (subcutaneous)  5,000 Units Subcutaneous Q8H  . insulin aspart  0-15 Units Subcutaneous Q4H  . insulin detemir  7 Units Subcutaneous BID  . levalbuterol  0.63 mg Nebulization Q6H WA  . pantoprazole (PROTONIX) IV  40 mg Intravenous Q12H   Continuous Infusions: . amiodarone 30 mg/hr (06/25/20 1848)  . piperacillin-tazobactam (ZOSYN)  IV Stopped (06/25/20 1201)  . potassium chloride 10 mEq (06/25/20 1937)   PRN Meds:.metoprolol tartrate, morphine injection, ondansetron **OR** ondansetron (ZOFRAN) IV, oxyCODONE    Anti-infectives (From admission, onward)   Start     Dose/Rate Route Frequency Ordered Stop   06/22/20 0030  piperacillin-tazobactam (ZOSYN) IVPB 3.375 g     Discontinue     3.375 g 12.5 mL/hr over 240 Minutes Intravenous  Every 8 hours 06/21/20 1953     06/21/20 1830  cefoTEtan (CEFOTAN) 2 g in sodium chloride 0.9 % 100 mL IVPB        2 g 200 mL/hr over 30 Minutes Intravenous On call to O.R. 06/21/20 1819 06/21/20 2002   06/21/20 1830  fluconazole (DIFLUCAN) IVPB 400 mg  Status:  Discontinued        400 mg 100 mL/hr over 120 Minutes Intravenous Every 24 hours 06/21/20 1828 06/21/20 2340   06/21/20 1630  piperacillin-tazobactam (ZOSYN) IVPB 3.375 g        3.375 g 100 mL/hr over 30 Minutes Intravenous  Once 06/21/20 1615 06/21/20 1751        Objective:   Vitals:   06/25/20 1800 06/25/20 1830 06/25/20 1838 06/25/20 1900  BP: (!) 145/53 (!) 148/63 (!) 160/69 140/63  Pulse: 79 80  80  Resp: 14 13  15   Temp:   98.2 F (36.8 C)   TempSrc:   Axillary   SpO2: 97%  98%  97%  Weight:      Height:        Wt Readings from Last 3 Encounters:  06/25/20 122 kg  06/20/20 115 kg  03/20/20 117.9 kg     Intake/Output Summary (Last 24 hours) at 06/25/2020 1947 Last data filed at 06/25/2020 1848 Gross per 24 hour  Intake 2203.24 ml  Output 4800 ml  Net -2596.76 ml    Physical Exam  General exam: Alert, awake, oriented x 3 Respiratory system: Crackles at bases. Respiratory effort normal. Cardiovascular system:RRR. No murmurs, rubs, gallops. Gastrointestinal system: Abdomen is nondistended, soft and nontender. No organomegaly or masses felt. Normal bowel sounds heard.  Ostomy left lower quadrant Central nervous system: Alert and oriented. No focal neurological deficits. Extremities: 2-3+ edema bilaterally Skin: No rashes, lesions or ulcers Psychiatry: Judgement and insight appear normal. Mood & affect appropriate.      dMicro Results Recent Results (from the past 240 hour(s))  SARS Coronavirus 2 by RT PCR (hospital order, performed in [facility] hospital lab) Nasopharyngeal Nasopharyngeal Swab     Status: None   Collection Time: 06/17/20  1:15 PM   Specimen: Nasopharyngeal Swab  Result Value Ref Range  Status   SARS Coronavirus 2 NEGATIVE NEGATIVE Final    Comment: (NOTE) SARS-CoV-2 target nucleic acids are NOT DETECTED.  The SARS-CoV-2 RNA is generally detectable in upper and lower respiratory specimens during the acute phase of infection. The lowest concentration of SARS-CoV-2 viral copies this assay can detect is 250 copies / mL. A negative result does not preclude SARS-CoV-2 infection and should not be used as the sole basis for treatment or other patient management decisions.  A negative result may occur with improper specimen collection / handling, submission of specimen other than nasopharyngeal swab, presence of viral mutation(s) within the areas targeted by this assay, and inadequate number of viral copies (<250 copies / mL). A negative result must be combined with clinical observations, patient history, and epidemiological information.  Fact Sheet for Patients:   StrictlyIdeas.no  Fact Sheet for Healthcare Providers: BankingDealers.co.za  This test is not yet approved or  cleared by the [place] FDA and has been authorized for detection and/or diagnosis of SARS-CoV-2 by FDA under an Emergency Use Authorization (EUA).  This EUA will remain in effect (meaning this test can be used) for the duration of the COVID-19 declaration under Section 564(b)(1) of the Act, 21 U.S.C. section 360bbb-3(b)(1), unless the authorization is terminated or revoked sooner.  Performed at [facility], [address], [place], [place] [address]   SARS Coronavirus 2 by RT PCR (hospital order, performed in [facility] hospital lab) Nasopharyngeal Nasopharyngeal Swab     Status: None   Collection Time: 06/21/20  1:47 PM   Specimen: Nasopharyngeal Swab  Result Value Ref Range Status   SARS Coronavirus 2 NEGATIVE NEGATIVE Final    Comment: (NOTE) SARS-CoV-2 target nucleic acids are NOT DETECTED.  The SARS-CoV-2 RNA is generally detectable in  upper and lower respiratory specimens during the acute phase of infection. The lowest concentration of SARS-CoV-2 viral copies this assay can detect is 250 copies / mL. A negative result does not preclude SARS-CoV-2 infection and should not be used as the sole basis for treatment or other patient management decisions.  A negative result may occur with improper specimen collection / handling, submission of specimen other than nasopharyngeal swab, presence of viral mutation(s) within the areas targeted by this assay, and inadequate number of viral copies (<250 copies /  mL). A negative result must be combined with clinical observations, patient history, and epidemiological information.  Fact Sheet for Patients:   StrictlyIdeas.no  Fact Sheet for Healthcare Providers: BankingDealers.co.za  This test is not yet approved or  cleared by the [place] FDA and has been authorized for detection and/or diagnosis of SARS-CoV-2 by FDA under an Emergency Use Authorization (EUA).  This EUA will remain in effect (meaning this test can be used) for the duration of the COVID-19 declaration under Section 564(b)(1) of the Act, 21 U.S.C. section 360bbb-3(b)(1), unless the authorization is terminated or revoked sooner.  Performed at [facility], [address], [place], [place] [address]   MRSA PCR Screening     Status: None   Collection Time: 06/21/20 11:31 PM   Specimen: Nasal Mucosa; Nasopharyngeal  Result Value Ref Range Status   MRSA by PCR NEGATIVE NEGATIVE Final    Comment:        The GeneXpert MRSA Assay (FDA approved for NASAL specimens only), is one component of a comprehensive MRSA colonization surveillance program. It is not intended to diagnose MRSA infection nor to guide or monitor treatment for MRSA infections. Performed at [facility], [address], [place], [place] [address]     Radiology Reports CT ABDOMEN PELVIS W  CONTRAST  Result Date: 06/21/2020 CLINICAL DATA:  Acute left lower quadrant abdominal pain. EXAM: CT ABDOMEN AND PELVIS WITH CONTRAST TECHNIQUE: Multidetector CT imaging of the abdomen and pelvis was performed using the standard protocol following bolus administration of intravenous contrast. CONTRAST:  150mL OMNIPAQUE IOHEXOL 300 MG/ML  SOLN COMPARISON:  None. FINDINGS: Lower chest: No acute abnormality. Hepatobiliary: Solitary gallstone is noted. No biliary dilatation is noted. The liver is unremarkable. Pancreas: Unremarkable. No pancreatic ductal dilatation or surrounding inflammatory changes. Spleen: Normal in size without focal abnormality. Adrenals/Urinary Tract: Adrenal glands are unremarkable. Kidneys are normal, without renal calculi, focal lesion, or hydronephrosis. Bladder is unremarkable. Stomach/Bowel: Moderate amount of free air is noted in the peritoneal space, particularly in the epigastric region. This is concerning for rupture of hollow viscus. However the source is not clearly identified. The stomach and appendix are unremarkable. There is no evidence of bowel obstruction or inflammation. Sigmoid diverticulosis is noted. Vascular/Lymphatic: Aortic atherosclerosis. No enlarged abdominal or pelvic lymph nodes. Reproductive: Prostate is unremarkable. Other: No abdominal wall hernia or abnormality. No abdominopelvic ascites. Musculoskeletal: No acute or significant osseous findings. IMPRESSION: 1. Moderate amount of free air is noted in the peritoneal space, particularly in the epigastric region. This is concerning for rupture of hollow viscus. However the source is not clearly identified. Critical Value/emergent results were called by telephone at the time of interpretation on 06/21/2020 at 3:45 pm to provider ABIGAIL HARRIS , who verbally acknowledged these results. 2. Sigmoid diverticulosis without inflammation. 3. Solitary gallstone. Aortic Atherosclerosis (ICD10-I70.0). Electronically Signed    By: Marijo Conception M.D.   On: 06/21/2020 15:47   US Venous Img Lower Bilateral  Result Date: 06/17/2020 CLINICAL DATA:  Edema EXAM: BILATERAL LOWER EXTREMITY VENOUS DOPPLER ULTRASOUND TECHNIQUE: Gray-scale sonography with compression, as well as color and duplex ultrasound, were performed to evaluate the deep venous system(s) from the level of the common femoral vein through the popliteal and proximal calf veins. COMPARISON:  None. FINDINGS: VENOUS Normal compressibility of the common femoral, superficial femoral, and popliteal veins, as well as the visualized calf veins. Visualized portions of profunda femoral vein and great saphenous vein unremarkable. No filling defects to suggest DVT on grayscale or  color Doppler imaging. Doppler waveforms show normal direction of venous flow, normal respiratory plasticity and response to augmentation. Limited views of the contralateral common femoral vein are unremarkable. OTHER Ill-defined fluid/edema within the subcutaneous soft tissues of both calves. Limitations: none IMPRESSION: 1. No DVT seen, bilateral lower extremities. 2. Fluid/edema within the subcutaneous soft tissues of both calves. Electronically Signed   By: Franki Cabot M.D.   On: 06/17/2020 14:10   DG Chest Port 1 View  Result Date: 06/23/2020 CLINICAL DATA:  Acute respiratory failure. EXAM: PORTABLE CHEST 1 VIEW COMPARISON:  06/22/2020.  CT 06/21/2020. FINDINGS: Endotracheal tube, NG tube, right IJ line stable position. Stable cardiomegaly. Low lung volumes with bibasilar atelectasis. No pleural effusion or pneumothorax. Free air in the right hemidiaphragm best identified by prior CT. IMPRESSION: 1.  Endotracheal tube, NG tube, right IJ line stable position. 2.  Stable cardiomegaly. 3. Low lung volumes with bibasilar atelectasis again noted. Chest is stable from prior exam. 4. Free air under the right hemidiaphragm best identified by prior CT. Electronically Signed   By: Marcello Moores  Register   On:  06/23/2020 07:00   DG Chest Port 1 View  Result Date: 06/22/2020 CLINICAL DATA:  Post intubation EXAM: PORTABLE CHEST 1 VIEW COMPARISON:  06/21/2020, CT 03/07/2020 FINDINGS: Interval intubation, tip of the endotracheal tube is about 14 mm superior to the carina. Right-sided central venous catheter tip over the cavoatrial region. Esophageal tube tip below the diaphragm but incompletely visualized. Cardiomegaly with aortic atherosclerosis. No pleural effusion or consolidation. Minimal lucency at the right diaphragm could reflect small free air demonstrated on CT. IMPRESSION: 1. Interval intubation with tip of the endotracheal tube about 14 mm superior to the carina. Right IJ central venous catheter tip over the cavoatrial region. No pneumothorax. 2. Cardiomegaly. No edema or infiltrate. 3. Possible small amount of free air beneath the right diaphragm, demonstrated on previous CT. Electronically Signed   By: Donavan Foil M.D.   On: 06/22/2020 00:14   DG Chest Port 1 View  Result Date: 06/21/2020 CLINICAL DATA:  Shortness of breath.  History of COPD. EXAM: PORTABLE CHEST 1 VIEW COMPARISON:  Single-view of the chest 06/17/2020 and 11/13/2019. CT chest 11/06/2019. FINDINGS: The lungs are emphysematous. Mild subsegmental atelectasis is seen in the lung bases. The lungs are otherwise clear. Heart size is normal. Aortic atherosclerosis. No pneumothorax or pleural fluid. No acute bony abnormality. IMPRESSION: No acute disease. Aortic Atherosclerosis (ICD10-I70.0) and Emphysema (ICD10-J43.9). Electronically Signed   By: Inge Rise M.D.   On: 06/21/2020 13:58   DG Chest Port 1 View  Result Date: 06/17/2020 CLINICAL DATA:  Bilateral foot swelling EXAM: PORTABLE CHEST 1 VIEW COMPARISON:  03/17/2020 FINDINGS: Stable mild cardiomegaly. Atherosclerotic calcification of the aortic knob. Mildly hyperexpanded lungs coarsened bibasilar interstitial markings. No focal airspace consolidation. No overt edema. No  significant pleural fluid collection. No pneumothorax. IMPRESSION: Stable mild cardiomegaly. No overt edema or focal airspace consolidation. Electronically Signed   By: Davina Poke D.O.   On: 06/17/2020 12:30     CBC Recent Labs  Lab 06/21/20 1240 06/21/20 1240 06/22/20 0510 06/22/20 1544 06/23/20 0459 06/24/20 0514 06/25/20 0635  WBC 13.8*  --  9.2  --  15.8* 12.9* 8.6  HGB 10.6*   < > 7.3* 9.6* 8.3* 7.0* 7.4*  HCT 32.9*   < > 23.0* 30.5* 26.2* 22.6* 23.5*  PLT 415*  --  PLT CLUMPING NOTED, COLLECT IN CITRATE FOR CBC  --  317 207 169  MCV 102.2*  --  105.0*  --  98.9 101.3* 100.0  MCH 32.9  --  33.3  --  31.3 31.4 31.5  MCHC 32.2  --  31.7  --  31.7 31.0 31.5  RDW 17.8*  --  18.3*  --  18.6* 17.9* 17.3*  LYMPHSABS  --   --  1.6  --   --   --   --   MONOABS  --   --  1.1*  --   --   --   --   EOSABS  --   --  0.0  --   --   --   --   BASOSABS  --   --  0.0  --   --   --   --    < > = values in this interval not displayed.    Chemistries  Recent Labs  Lab 06/21/20 1240 06/21/20 1240 06/22/20 0510 06/22/20 1544 06/23/20 0459 06/24/20 0514 06/25/20 0635  NA 137   < > 137 133* 135 142 145  K 3.8   < > 4.2 4.3 4.2 3.2* 3.1*  CL 99   < > 102 102 102 109 109  CO2 25   < > 24 20* 21* 25 24  GLUCOSE 202*   < > 221* 243* 281* 72 96  BUN 37*   < > 31* 37* 37* 37* 32*  CREATININE 1.19   < > 1.40* 1.69* 2.01* 2.04* 1.94*  CALCIUM 8.4*   < > 7.4* 7.3* 7.0* 7.5* 8.0*  MG  --   --  1.6*  --  2.2  --  2.3  AST 18  --  18  --   --  25  --   ALT 24  --  21  --   --  24  --   ALKPHOS 48  --  27*  --   --  37*  --   BILITOT 1.0  --  0.8  --   --  0.6  --    < > = values in this interval not displayed.   ------------------------------------------------------------------------------------------------------------------ No results for input(s): CHOL, HDL, LDLCALC, TRIG, CHOLHDL, LDLDIRECT in the last 72 hours.  Lab Results  Component Value Date   HGBA1C 5.4 06/17/2020    ------------------------------------------------------------------------------------------------------------------ No results for input(s): TSH, T4TOTAL, T3FREE, THYROIDAB in the last 72 hours.  Invalid input(s): FREET3 ------------------------------------------------------------------------------------------------------------------ No results for input(s): VITAMINB12, FOLATE, FERRITIN, TIBC, IRON, RETICCTPCT in the last 72 hours.  Coagulation profile Recent Labs  Lab 06/24/20 0916  INR 1.4*    No results for input(s): DDIMER in the last 72 hours.  Cardiac Enzymes No results for input(s): CKMB, TROPONINI, MYOGLOBIN in the last 168 hours.  Invalid input(s): CK ------------------------------------------------------------------------------------------------------------------    Component Value Date/Time   BNP 87.0 06/17/2020 1134    Kathie Dike M.D on 06/25/2020 at 7:47 PM  Go to [email or web address] - for contact info  [facility] - Office  [phone]

## 2020-06-25 NOTE — ED Provider Notes (Signed)
[facility] Emergency Department Provider Note MRN:  [id number]  Arrival date & time: 06/25/20     Chief Complaint   Leg Swelling   History of Present Illness   Vincent Cooper is a 79 y.o. year-old male with a history of COPD, CAD presenting to the ED with chief complaint of leg swelling.  Worsening leg swelling over the past 4 days. Denies chest pain or shortness of breath, no fever, no abdominal pain. Discomfort in the legs is mild to moderate, constant, worse with motion or palpation.  Review of Systems  A complete 10 system review of systems was obtained and all systems are negative except as noted in the HPI and PMH.   Patient's Health History    Past Medical History:  Diagnosis Date  . Chronic gout   . COPD (chronic obstructive pulmonary disease) ([place])    Oxygen dependent  . 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 CAD-RADS 4 - Severe.  ? >50% LM stenosis -->CTFFR -- NOT PHYSIOLOGICALLY SIGNIFICANT; Large Dom RCA-<PDA/PLA.  Diffuse mild to mod plaque (25-49%) prox segment and mod (50-69%) mid-distal;  CTFFR: prox 0.95, mid 0.88, distal 0.86 (not significant); Med-sized LAD w/ long diffuse mod-severe plaque calcified plaque in prox-mid ~ 50 -69%, ? >70% -> CTFFR :   . Diabetes mellitus type II, non insulin dependent ([place])   . Hypertension   . Morbid obesity (HCC)    BMI of 38.5 with multiple risk factors.  . OSA on CPAP   . Pulmonary emboli ([place]) 10/2019   Chest CTA-4 Phadke opacification of main PA but there is partially occlusive main posterior RLL and segmental/segmental branches.  Small thrombus noted in the anterior right middle lobe.  No RV strain.  Scattered aortic atherosclerosis involving great vessels.  Coronary calcification noted.  Marland Kitchen RLS (restless legs syndrome)     Past Surgical History:  Procedure Laterality Date  . Cataract surgery Right   . LAPAROTOMY N/A  06/21/2020   Procedure: EXPLORATORY LAPAROTOMY,  bowel resection, creation ostomy;  Surgeon: Virl Cagey, MD;  Location: [facility];  Service: General;  Laterality: N/A;    Family History  Problem Relation Age of Onset  . Colon cancer Mother   . CAD Neg Hx   . Diabetes Neg Hx     Social History   Socioeconomic History  . Marital status: Legally Separated    Spouse name: Not on file  . Number of children: Not on file  . Years of education: Not on file  . Highest education level: Not on file  Occupational History  . Not on file  Tobacco Use  . Smoking status: Former Research scientist (life sciences)  . Smokeless tobacco: Former Network engineer  . Vaping Use: Never used  Substance and Sexual Activity  . Alcohol use: Not Currently  . Drug use: Never  . Sexual activity: Not on file  Other Topics Concern  . Not on file  Social History Narrative  . Not on file   Social Determinants of Health   Financial Resource Strain:   . Difficulty of Paying Living Expenses:   Food Insecurity:   . Worried About Charity fundraiser in the Last Year:   . Arboriculturist in the Last Year:   Transportation Needs:   . Film/video editor (Medical):   Marland Kitchen Lack of Transportation (Non-Medical):   Physical Activity:   . Days of Exercise  per Week:   . Minutes of Exercise per Session:   Stress:   . Feeling of Stress :   Social Connections:   . Frequency of Communication with Friends and Family:   . Frequency of Social Gatherings with Friends and Family:   . Attends Religious Services:   . Active Member of Clubs or Organizations:   . Attends Archivist Meetings:   Marland Kitchen Marital Status:   Intimate Partner Violence:   . Fear of Current or Ex-Partner:   . Emotionally Abused:   Marland Kitchen Physically Abused:   . Sexually Abused:      Physical Exam   Vitals:   06/20/20 0907 06/20/20 1324  BP:    Pulse:    Resp:    Temp:    SpO2: 99% 98%    CONSTITUTIONAL: Chronically ill-appearing, NAD NEURO:  Alert and  oriented x 3, no focal deficits EYES:  eyes equal and reactive ENT/NECK:  no LAD, no JVD CARDIO: Regular rate, well-perfused, normal S1 and S2 PULM:  CTAB no wheezing or rhonchi GI/GU:  normal bowel sounds, non-distended, non-tender MSK/SPINE:  No gross deformities, 2+ pitting edema to the bilateral lower extremities with chronic erythematous skin changes SKIN:  no rash, atraumatic PSYCH:  Appropriate speech and behavior  *Additional and/or pertinent findings included in MDM below  Diagnostic and Interventional Summary    EKG Interpretation  Date/Time:  Saturday June 17 2020 13:08:43 EDT Ventricular Rate:  97 PR Interval:    QRS Duration: 90 QT Interval:  356 QTC Calculation: 453 R Axis:   73 Text Interpretation: Sinus rhythm Borderline T abnormalities, lateral leads Confirmed by Veryl Speak [phone]) on 06/18/2020 11:43:00 PM      Labs Reviewed  CBC - Abnormal; Notable for the following components:      Result Value   RBC 3.11 (*)    Hemoglobin 10.2 (*)    HCT 30.2 (*)    RDW 17.1 (*)    All other components within normal limits  URINALYSIS, ROUTINE W REFLEX MICROSCOPIC - Abnormal; Notable for the following components:   Glucose, UA 50 (*)    Hgb urine dipstick SMALL (*)    Protein, ur 30 (*)    Bacteria, UA RARE (*)    All other components within normal limits  COMPREHENSIVE METABOLIC PANEL - Abnormal; Notable for the following components:   Potassium 2.2 (*)    Chloride 90 (*)    Glucose, Bld 204 (*)    Creatinine, Ser 1.50 (*)    Calcium 8.4 (*)    Total Protein 6.0 (*)    Albumin 3.0 (*)    GFR calc non Af Amer 44 (*)    GFR calc Af Amer 51 (*)    All other components within normal limits  FERRITIN - Abnormal; Notable for the following components:   Ferritin 765 (*)    All other components within normal limits  GLUCOSE, CAPILLARY - Abnormal; Notable for the following components:   Glucose-Capillary 178 (*)    All other components within normal limits    BASIC METABOLIC PANEL - Abnormal; Notable for the following components:   Potassium 2.7 (*)    Chloride 96 (*)    Glucose, Bld 105 (*)    Calcium 8.5 (*)    GFR calc non Af Amer 60 (*)    All other components within normal limits  CBC - Abnormal; Notable for the following components:   RBC 3.08 (*)    Hemoglobin 10.1 (*)  HCT 30.8 (*)    RDW 17.7 (*)    All other components within normal limits  GLUCOSE, CAPILLARY - Abnormal; Notable for the following components:   Glucose-Capillary 194 (*)    All other components within normal limits  GLUCOSE, CAPILLARY - Abnormal; Notable for the following components:   Glucose-Capillary 108 (*)    All other components within normal limits  GLUCOSE, CAPILLARY - Abnormal; Notable for the following components:   Glucose-Capillary 211 (*)    All other components within normal limits  BASIC METABOLIC PANEL - Abnormal; Notable for the following components:   Glucose, Bld 145 (*)    Calcium 8.0 (*)    GFR calc non Af Amer 57 (*)    All other components within normal limits  CBC - Abnormal; Notable for the following components:   RBC 2.87 (*)    Hemoglobin 9.6 (*)    HCT 29.1 (*)    MCV 101.4 (*)    RDW 17.8 (*)    All other components within normal limits  GLUCOSE, CAPILLARY - Abnormal; Notable for the following components:   Glucose-Capillary 150 (*)    All other components within normal limits  GLUCOSE, CAPILLARY - Abnormal; Notable for the following components:   Glucose-Capillary 209 (*)    All other components within normal limits  GLUCOSE, CAPILLARY - Abnormal; Notable for the following components:   Glucose-Capillary 144 (*)    All other components within normal limits  GLUCOSE, CAPILLARY - Abnormal; Notable for the following components:   Glucose-Capillary 175 (*)    All other components within normal limits  BASIC METABOLIC PANEL - Abnormal; Notable for the following components:   Sodium 132 (*)    Glucose, Bld 275 (*)     Calcium 8.1 (*)    All other components within normal limits  GLUCOSE, CAPILLARY - Abnormal; Notable for the following components:   Glucose-Capillary 275 (*)    All other components within normal limits  GLUCOSE, CAPILLARY - Abnormal; Notable for the following components:   Glucose-Capillary 378 (*)    All other components within normal limits  GLUCOSE, CAPILLARY - Abnormal; Notable for the following components:   Glucose-Capillary 267 (*)    All other components within normal limits  GLUCOSE, CAPILLARY - Abnormal; Notable for the following components:   Glucose-Capillary 267 (*)    All other components within normal limits  GLUCOSE, CAPILLARY - Abnormal; Notable for the following components:   Glucose-Capillary 316 (*)    All other components within normal limits  SARS CORONAVIRUS 2 BY RT PCR (HOSPITAL ORDER, [place] [facility])  BRAIN NATRIURETIC PEPTIDE  HEMOGLOBIN A1C  VITAMIN B12  FOLATE  IRON AND TIBC  MAGNESIUM    US Venous Img Lower Bilateral  Final Result    DG Chest Port 1 View  Final Result      Medications  magnesium sulfate IVPB 2 g 50 mL (0 g Intravenous Stopped 06/17/20 1459)  potassium chloride SA (KLOR-CON) CR tablet 40 mEq (40 mEq Oral Given 06/17/20 1327)  potassium chloride 10 mEq in 100 mL IVPB (10 mEq Intravenous New Bag/Given 06/17/20 1544)  sodium chloride 0.9 % bolus 500 mL (0 mLs Intravenous Stopped 06/17/20 1408)  potassium chloride SA (KLOR-CON) CR tablet 40 mEq (40 mEq Oral Given 06/17/20 1747)  potassium chloride SA (KLOR-CON) CR tablet 40 mEq (40 mEq Oral Given 06/18/20 1346)  potassium chloride 10 mEq in 100 mL IVPB (10 mEq Intravenous New Bag/Given 06/18/20 1246)  vancomycin (VANCOREADY) IVPB 2000 mg/400 mL (2,000 mg Intravenous New Bag/Given 06/18/20 1522)  potassium chloride SA (KLOR-CON) CR tablet 20 mEq (20 mEq Oral Given 06/19/20 1045)     Procedures  /  Critical Care .Critical Care Performed by: Maudie Flakes,  MD Authorized by: Maudie Flakes, MD   Critical care provider statement:    Critical care time (minutes):  36   Critical care was necessary to treat or prevent imminent or life-threatening deterioration of the following conditions: Critical hypokalemia.   Critical care was time spent personally by me on the following activities:  Discussions with consultants, evaluation of patient's response to treatment, examination of patient, ordering and performing treatments and interventions, ordering and review of laboratory studies, ordering and review of radiographic studies, pulse oximetry, re-evaluation of patient's condition, obtaining history from patient or surrogate and review of old charts    ED Course and Medical Decision Making  I have reviewed the triage vital signs, the nursing notes, and pertinent available records from the EMR.  Listed above are laboratory and imaging tests that I personally ordered, reviewed, and interpreted and then considered in my medical decision making (see below for details).      Lower extremity edema, suspect related to venous insufficiency. Labs performed to evaluate for evidence of heart or liver or renal impairment. Labs revealed potassium of 2.2, repletion started in the emergency department, admitted to the hospital service for further care.    Barth Kirks. Sedonia Small, [place] [email or web address]  Final Clinical Impressions(s) / ED Diagnoses     ICD-10-CM   1. Hypokalemia  E87.6   2. Leg pain  M79.606 CANCELED: US Venous Img Lower Bilateral (DVT)    CANCELED: US Venous Img Lower Bilateral (DVT)      Discharge Instructions Discussed with and Provided to Patient:   Discharge Instructions   None       Maudie Flakes, MD 06/25/20 2029

## 2020-06-25 NOTE — Progress Notes (Signed)
Dressing changed per order. Patient tolerated well.

## 2020-06-25 NOTE — Progress Notes (Addendum)
[facility] Progress Note  4 Days Post-Op  Subjective:  Icey went well and NG output is getting thinner and less. The ostomy is making some stool but no gas.   Objective: Vital signs in last 24 hours: Temp:  [97.3 F (36.3 C)-98.3 F (36.8 C)] 97.8 F (36.6 C) (08/08 0900) Pulse Rate:  [77-98] 81 (08/08 1045) Resp:  [12-22] 17 (08/08 0900) BP: (108-154)/(41-114) 136/54 (08/08 1030) SpO2:  [88 %-100 %] 99 % (08/08 1030) Weight:  [510 kg] 122 kg (08/08 0500) Last BM Date: 06/20/20  Intake/Output from previous day: 08/07 0701 - 08/08 0700 In: 1699.4 [I.V.:695.1; Blood:338; IV Piggyback:666.3] Out: 2585 [Urine:2350; Emesis/NG output:1500] Intake/Output this shift: Total I/O In: -  Out: 700 [Emesis/NG output:700]  General appearance: alert, cooperative and no distress Resp: normal work of breathing GI: soft, obese, midline with dressing, ostomy with stool but no gas  Lab Results:  Recent Labs    06/24/20 0514 06/25/20 0635  WBC 12.9* 8.6  HGB 7.0* 7.4*  HCT 22.6* 23.5*  PLT 207 169   BMET Recent Labs    06/24/20 0514 06/25/20 0635  NA 142 145  K 3.2* 3.1*  CL 109 109  CO2 25 24  GLUCOSE 72 96  BUN 37* 32*  CREATININE 2.04* 1.94*  CALCIUM 7.5* 8.0*   PT/INR Recent Labs    06/24/20 0916  LABPROT 16.6*  INR 1.4*    Studies/Results: No results found.  Anti-infectives: Anti-infectives (From admission, onward)   Start     Dose/Rate Route Frequency Ordered Stop   06/22/20 0030  piperacillin-tazobactam (ZOSYN) IVPB 3.375 g     Discontinue     3.375 g 12.5 mL/hr over 240 Minutes Intravenous Every 8 hours 06/21/20 1953     06/21/20 1830  cefoTEtan (CEFOTAN) 2 g in sodium chloride 0.9 % 100 mL IVPB        2 g 200 mL/hr over 30 Minutes Intravenous On call to O.R. 06/21/20 1819 06/21/20 2002   06/21/20 1830  fluconazole (DIFLUCAN) IVPB 400 mg  Status:  Discontinued        400 mg 100 mL/hr over 120 Minutes Intravenous Every 24 hours  06/21/20 1828 06/21/20 2340   06/21/20 1630  piperacillin-tazobactam (ZOSYN) IVPB 3.375 g        3.375 g 100 mL/hr over 30 Minutes Intravenous  Once 06/21/20 1615 06/21/20 1751      Assessment/Plan: Mr. Debby Bud is a81 yo s/p Ex lap, Hartman's for perforated diverticulitis. Doing better. PRN for pain IS, OOB Needs to move and ambulate if possible NG in place, icy for comfort Ostomy function starting H&H drifting, pRBC and FFP today as INR was 1.4 UOP improving, Cr down, received some lasix  Zosyn until 8/10 for intraabdominal contamination and perforated diverticulitis  PE 10/2019 holding heparin gtt / therapeutic coagulation due to dropping H&H, on prophylactic heparin  Discussed with Dr. Roderic Palau and RN.    LOS: 4 days    Virl Cagey 06/25/2020

## 2020-06-25 NOTE — Progress Notes (Signed)
Flushed all 3 lines and reported occluded line is now free and returns blood readily. All 3 lines flush and return blood at this time.

## 2020-06-26 ENCOUNTER — Inpatient Hospital Stay: Payer: Self-pay

## 2020-06-26 LAB — BPAM RBC
Blood Product Expiration Date: 202109062359
Blood Product Expiration Date: 202109112359
ISSUE DATE / TIME: 202108071034
ISSUE DATE / TIME: 202108081215
Unit Type and Rh: 5100
Unit Type and Rh: 5100

## 2020-06-26 LAB — TYPE AND SCREEN
ABO/RH(D): O POS
Antibody Screen: NEGATIVE
Unit division: 0
Unit division: 0

## 2020-06-26 LAB — BPAM FFP
Blood Product Expiration Date: 202108132359
Blood Product Expiration Date: 202108132359
ISSUE DATE / TIME: 202108081543
ISSUE DATE / TIME: 202108082006
Unit Type and Rh: 5100
Unit Type and Rh: 6200

## 2020-06-26 LAB — GLUCOSE, CAPILLARY
Glucose-Capillary: 102 mg/dL — ABNORMAL HIGH (ref 70–99)
Glucose-Capillary: 144 mg/dL — ABNORMAL HIGH (ref 70–99)
Glucose-Capillary: 88 mg/dL (ref 70–99)
Glucose-Capillary: 90 mg/dL (ref 70–99)
Glucose-Capillary: 99 mg/dL (ref 70–99)

## 2020-06-26 LAB — PREPARE FRESH FROZEN PLASMA
Unit division: 0
Unit division: 0

## 2020-06-26 LAB — BASIC METABOLIC PANEL
Anion gap: 13 (ref 5–15)
BUN: 27 mg/dL — ABNORMAL HIGH (ref 8–23)
CO2: 27 mmol/L (ref 22–32)
Calcium: 8.3 mg/dL — ABNORMAL LOW (ref 8.9–10.3)
Chloride: 104 mmol/L (ref 98–111)
Creatinine, Ser: 1.88 mg/dL — ABNORMAL HIGH (ref 0.61–1.24)
GFR calc Af Amer: 39 mL/min — ABNORMAL LOW (ref >60)
GFR calc non Af Amer: 33 mL/min — ABNORMAL LOW (ref >60)
Glucose, Bld: 96 mg/dL (ref 70–99)
Potassium: 3 mmol/L — ABNORMAL LOW (ref 3.5–5.1)
Sodium: 144 mmol/L (ref 135–145)

## 2020-06-26 LAB — CBC
HCT: 27.8 % — ABNORMAL LOW (ref 39.0–52.0)
Hemoglobin: 8.6 g/dL — ABNORMAL LOW (ref 13.0–17.0)
MCH: 30.2 pg (ref 26.0–34.0)
MCHC: 30.9 g/dL (ref 30.0–36.0)
MCV: 97.5 fL (ref 80.0–100.0)
Platelets: 156 10*3/uL (ref 150–400)
RBC: 2.85 MIL/uL — ABNORMAL LOW (ref 4.22–5.81)
RDW: 17.4 % — ABNORMAL HIGH (ref 11.5–15.5)
WBC: 8.4 10*3/uL (ref 4.0–10.5)
nRBC: 0 % (ref 0.0–0.2)

## 2020-06-26 LAB — HEPARIN LEVEL (UNFRACTIONATED): Heparin Unfractionated: 0.76 IU/mL — ABNORMAL HIGH (ref 0.30–0.70)

## 2020-06-26 MED ORDER — POTASSIUM CHLORIDE 10 MEQ/100ML IV SOLN
10.0000 meq | INTRAVENOUS | Status: AC
  Administered 2020-06-26 (×4): 10 meq via INTRAVENOUS
  Filled 2020-06-26 (×4): qty 100

## 2020-06-26 MED ORDER — POTASSIUM CHLORIDE CRYS ER 20 MEQ PO TBCR
40.0000 meq | EXTENDED_RELEASE_TABLET | ORAL | Status: AC
  Administered 2020-06-26 (×2): 40 meq via ORAL
  Filled 2020-06-26 (×2): qty 2

## 2020-06-26 MED ORDER — HEPARIN (PORCINE) 25000 UT/250ML-% IV SOLN
1300.0000 [IU]/h | INTRAVENOUS | Status: DC
  Administered 2020-06-26: 1400 [IU]/h via INTRAVENOUS
  Administered 2020-06-27 – 2020-06-28 (×2): 1150 [IU]/h via INTRAVENOUS
  Filled 2020-06-26 (×3): qty 250

## 2020-06-26 NOTE — Progress Notes (Signed)
Patient Demographics:    Vincent Cooper, is a 79 y.o. male, DOB - June 23, 1941, OZD:[id number]  Admit date - 06/21/2020   Admitting Physician Albertine Patricia, MD  Outpatient Primary MD for the patient is [name], DO  LOS - 5   Chief Complaint  Patient presents with  . Abdominal Pain        Subjective:    Vincent Cooper wife at bedside, tolerating sips, current material in ostomy bag, wants to eat more   Assessment  & Plan :    Principal Problem:   Bowel perforation (HCC) Active Problems:   COPD (chronic obstructive pulmonary disease) (HCC)   Diabetes mellitus type II, non insulin dependent (HCC)   OSA and COPD overlap syndrome (HCC)   Essential hypertension   Morbid obesity (HCC)   Chronic diastolic HF (heart failure) (HCC)   Coronary artery disease, non-occlusive   Hyperlipidemia with target LDL less than 70   Free intraperitoneal air   Brief Summary:- 79 year old obese man with dm2, htn, cad, COPD, H/o PE on Apixaban admitted on 06/21/2020 with abdominal pain and found to have colonic perforation due to fishbone impaction and underwent partial colectomy, end colostomy emergently on 06/21/2020, postop patient had persistent hypotension most likely due to severe sepsis with septic shock -Hemodynamics improved patient weaned off pressors on 06/23/2020, off vent and extubated on  06/23/2020  A/p 1)Severe sepsis with septic shock--surgical pathology suggest diverticulitis with bowel perforation and abscess resulting in fecal peritonitis peritonitis -and severe sepsis with septic shock --Sepsis pathophysiology appears to have resolved as of 06/23/2020 -Hypotension resolved, discontinued Neo-Synephrine, discontinue vasopressin and discontinued Levophed on 06/23/2020 --- -lactic acid is 2.1 >>2.3>>1.8 ---Continue IV Zosyn started 06/21/2020  2)New onset A. fib with RVR --tachyarrhythmia in the setting of  severe sepsis and septic shock--- did not respond to IV metoprolol, continue on IV amiodarone until able to take oral intake at which time consider transitioning to p.o. Cardizem last known EF was 60 to 65% based on echo from December 2020.   --Started IV heparin on 06/26/2020 without bolus  3) history of PE--- diagnosed in December 2020-- PTA was on Eliquis currently on hold perioperatively, restart heparin without bolus on 06/26/2020 per general surgeon -Received Eppie Gibson preop -Okay to use subcu heparin for DVT prophylaxis at this time  4)DM2- Use Novolog/Humalog Sliding scale insulin with Accu-Cheks/Fingersticks as ordered -Patient currently n.p.o. so avoid over aggressive diabetic control  5)Obesity/OSA--- history of poor compliance with CPAP PTA,   6) acute symptomatic anemia due to acute blood loss--- baseline hemoglobin of 10 prior to surgery.  Since surgery, he has required transfusion of 2 unit of PRBC which is thought related to blood loss.  Hemoglobin remains low today.  He will be transfused another unit of PRBC today.  Since INR is mildly elevated, he will also receive 2 units FFP.  7)AKI----acute kidney injury due to reduced renal perfusion in the setting of persistent hypotension in the setting of severe sepsis with septic shock compounded by lisinopril, torsemide and Metformin use PTA -   creatinine on admission= 1.0 , baseline creatinine = 1.0   , creatinine peaked at 2.0.  Currently 1.88  renally adjust medications, avoid nephrotoxic agents / dehydration  / hypotension -Lisinopril  lisinopril is currently on hold -Restarted on IV Lasix due to severe anasarca, continue to follow renal function  8) anasarca.  Patient received IV albumin infusions for hypoalbuminemia.  Will start on IV Lasix and monitor urine output.  9) hypokalemia.  Replace.  Magnesium 2.3.   Time spent: 35 mins  Disposition/Need for in-Hospital Stay- patient unable to be discharged at this time due to  ----awaiting return of bowel function and tolerance of oral intake--- currently requiring IV fluids Status is: Inpatient  Remains inpatient appropriate because:awaiting return of bowel function and tolerance of oral intake--- currently requiring IV fluids   Disposition: The patient is from: Home              Anticipated d/c is to: TBD              Anticipated d/c date is: > 3 days              Patient currently is not medically stable to d/c. Barriers: Not Clinically Stable- -awaiting return of bowel function and tolerance of oral intake--- currently requiring IV fluids  Procedures:- 8/4 partial colectomy, end colostomy, laparotomy 8/4 RIJ >> 8/4 ETT >> -Extubated 06/23/2020  Code Status : FULL   Family Communication:  Discussed with daughter at the bedside Consults  :  Gen surg/PCCM  DVT Prophylaxis  :   - SCDs /Heparin sq  Lab Results  Component Value Date   PLT 156 06/26/2020    Inpatient Medications  Scheduled Meds: . sodium chloride   Intravenous Once  . Chlorhexidine Gluconate Cloth  6 each Topical Daily  . furosemide  20 mg Intravenous BID  . insulin aspart  0-15 Units Subcutaneous Q4H  . insulin detemir  7 Units Subcutaneous BID  . levalbuterol  0.63 mg Nebulization Q6H WA  . pantoprazole (PROTONIX) IV  40 mg Intravenous Q12H   Continuous Infusions: . amiodarone 30 mg/hr (06/26/20 1800)  . heparin 1,400 Units/hr (06/26/20 1800)  . piperacillin-tazobactam (ZOSYN)  IV 12.5 mL/hr at 06/26/20 1800   PRN Meds:.metoprolol tartrate, morphine injection, ondansetron **OR** ondansetron (ZOFRAN) IV, oxyCODONE    Anti-infectives (From admission, onward)   Start     Dose/Rate Route Frequency Ordered Stop   06/22/20 0030  piperacillin-tazobactam (ZOSYN) IVPB 3.375 g     Discontinue     3.375 g 12.5 mL/hr over 240 Minutes Intravenous Every 8 hours 06/21/20 1953     06/21/20 1830  cefoTEtan (CEFOTAN) 2 g in sodium chloride 0.9 % 100 mL IVPB        2 g 200 mL/hr over 30  Minutes Intravenous On call to O.R. 06/21/20 1819 06/21/20 2002   06/21/20 1830  fluconazole (DIFLUCAN) IVPB 400 mg  Status:  Discontinued        400 mg 100 mL/hr over 120 Minutes Intravenous Every 24 hours 06/21/20 1828 06/21/20 2340   06/21/20 1630  piperacillin-tazobactam (ZOSYN) IVPB 3.375 g        3.375 g 100 mL/hr over 30 Minutes Intravenous  Once 06/21/20 1615 06/21/20 1751        Objective:   Vitals:   06/26/20 1614 06/26/20 1643 06/26/20 1700 06/26/20 1800  BP:      Pulse: 81 84 85   Resp: 16 13 13 12   Temp: 98.5 F (36.9 C)     TempSrc: Oral     SpO2: (!) 87% 93% 90%   Weight:      Height:        Wt Readings  from Last 3 Encounters:  06/25/20 122 kg  06/20/20 115 kg  03/20/20 117.9 kg     Intake/Output Summary (Last 24 hours) at 06/26/2020 1900 Last data filed at 06/26/2020 1836 Gross per 24 hour  Intake 1317.71 ml  Output 4350 ml  Net -3032.29 ml    Physical Exam  General exam: Alert, awake, oriented x 3 Respiratory system: Crackles at bases. Respiratory effort normal. Cardiovascular system:RRR. No murmurs, rubs, gallops. Gastrointestinal system: Abdomen is nondistended, soft and nontender. No organomegaly or masses felt. Normal bowel sounds heard.  Ostomy left lower quadrant Central nervous system: Alert and oriented. No focal neurological deficits. Extremities: 2-3+ edema bilaterally Skin: No rashes, lesions or ulcers Psychiatry: Judgement and insight appear normal. Mood & affect appropriate.      dMicro Results Recent Results (from the past 240 hour(s))  SARS Coronavirus 2 by RT PCR (hospital order, performed in [facility] hospital lab) Nasopharyngeal Nasopharyngeal Swab     Status: None   Collection Time: 06/17/20  1:15 PM   Specimen: Nasopharyngeal Swab  Result Value Ref Range Status   SARS Coronavirus 2 NEGATIVE NEGATIVE Final    Comment: (NOTE) SARS-CoV-2 target nucleic acids are NOT DETECTED.  The SARS-CoV-2 RNA is generally detectable in  upper and lower respiratory specimens during the acute phase of infection. The lowest concentration of SARS-CoV-2 viral copies this assay can detect is 250 copies / mL. A negative result does not preclude SARS-CoV-2 infection and should not be used as the sole basis for treatment or other patient management decisions.  A negative result may occur with improper specimen collection / handling, submission of specimen other than nasopharyngeal swab, presence of viral mutation(s) within the areas targeted by this assay, and inadequate number of viral copies (<250 copies / mL). A negative result must be combined with clinical observations, patient history, and epidemiological information.  Fact Sheet for Patients:   StrictlyIdeas.no  Fact Sheet for Healthcare Providers: BankingDealers.co.za  This test is not yet approved or  cleared by the [place] FDA and has been authorized for detection and/or diagnosis of SARS-CoV-2 by FDA under an Emergency Use Authorization (EUA).  This EUA will remain in effect (meaning this test can be used) for the duration of the COVID-19 declaration under Section 564(b)(1) of the Act, 21 U.S.C. section 360bbb-3(b)(1), unless the authorization is terminated or revoked sooner.  Performed at [facility], [address], [place], [place] [address]   SARS Coronavirus 2 by RT PCR (hospital order, performed in [facility] hospital lab) Nasopharyngeal Nasopharyngeal Swab     Status: None   Collection Time: 06/21/20  1:47 PM   Specimen: Nasopharyngeal Swab  Result Value Ref Range Status   SARS Coronavirus 2 NEGATIVE NEGATIVE Final    Comment: (NOTE) SARS-CoV-2 target nucleic acids are NOT DETECTED.  The SARS-CoV-2 RNA is generally detectable in upper and lower respiratory specimens during the acute phase of infection. The lowest concentration of SARS-CoV-2 viral copies this assay can detect is 250 copies / mL. A  negative result does not preclude SARS-CoV-2 infection and should not be used as the sole basis for treatment or other patient management decisions.  A negative result may occur with improper specimen collection / handling, submission of specimen other than nasopharyngeal swab, presence of viral mutation(s) within the areas targeted by this assay, and inadequate number of viral copies (<250 copies / mL). A negative result must be combined with clinical observations, patient history, and epidemiological information.  Fact Sheet for  Patients:   StrictlyIdeas.no  Fact Sheet for Healthcare Providers: BankingDealers.co.za  This test is not yet approved or  cleared by the [place] FDA and has been authorized for detection and/or diagnosis of SARS-CoV-2 by FDA under an Emergency Use Authorization (EUA).  This EUA will remain in effect (meaning this test can be used) for the duration of the COVID-19 declaration under Section 564(b)(1) of the Act, 21 U.S.C. section 360bbb-3(b)(1), unless the authorization is terminated or revoked sooner.  Performed at [facility], [address] [place], [place] [address]   MRSA PCR Screening     Status: None   Collection Time: 06/21/20 11:31 PM   Specimen: Nasal Mucosa; Nasopharyngeal  Result Value Ref Range Status   MRSA by PCR NEGATIVE NEGATIVE Final    Comment:        The GeneXpert MRSA Assay (FDA approved for NASAL specimens only), is one component of a comprehensive MRSA colonization surveillance program. It is not intended to diagnose MRSA infection nor to guide or monitor treatment for MRSA infections. Performed at [facility], [address] [place],  [address]     Radiology Reports CT ABDOMEN PELVIS W CONTRAST  Result Date: 06/21/2020 CLINICAL DATA:  Acute left lower quadrant abdominal pain. EXAM: CT ABDOMEN AND PELVIS WITH CONTRAST TECHNIQUE: Multidetector CT imaging of the  abdomen and pelvis was performed using the standard protocol following bolus administration of intravenous contrast. CONTRAST:  139mL OMNIPAQUE IOHEXOL 300 MG/ML  SOLN COMPARISON:  None. FINDINGS: Lower chest: No acute abnormality. Hepatobiliary: Solitary gallstone is noted. No biliary dilatation is noted. The liver is unremarkable. Pancreas: Unremarkable. No pancreatic ductal dilatation or surrounding inflammatory changes. Spleen: Normal in size without focal abnormality. Adrenals/Urinary Tract: Adrenal glands are unremarkable. Kidneys are normal, without renal calculi, focal lesion, or hydronephrosis. Bladder is unremarkable. Stomach/Bowel: Moderate amount of free air is noted in the peritoneal space, particularly in the epigastric region. This is concerning for rupture of hollow viscus. However the source is not clearly identified. The stomach and appendix are unremarkable. There is no evidence of bowel obstruction or inflammation. Sigmoid diverticulosis is noted. Vascular/Lymphatic: Aortic atherosclerosis. No enlarged abdominal or pelvic lymph nodes. Reproductive: Prostate is unremarkable. Other: No abdominal wall hernia or abnormality. No abdominopelvic ascites. Musculoskeletal: No acute or significant osseous findings. IMPRESSION: 1. Moderate amount of free air is noted in the peritoneal space, particularly in the epigastric region. This is concerning for rupture of hollow viscus. However the source is not clearly identified. Critical Value/emergent results were called by telephone at the time of interpretation on 06/21/2020 at 3:45 pm to provider ABIGAIL HARRIS , who verbally acknowledged these results. 2. Sigmoid diverticulosis without inflammation. 3. Solitary gallstone. Aortic Atherosclerosis (ICD10-I70.0). Electronically Signed   By: Marijo Conception M.D.   On: 06/21/2020 15:47   US Venous Img Lower Bilateral  Result Date: 06/17/2020 CLINICAL DATA:  Edema EXAM: BILATERAL LOWER EXTREMITY VENOUS DOPPLER  ULTRASOUND TECHNIQUE: Gray-scale sonography with compression, as well as color and duplex ultrasound, were performed to evaluate the deep venous system(s) from the level of the common femoral vein through the popliteal and proximal calf veins. COMPARISON:  None. FINDINGS: VENOUS Normal compressibility of the common femoral, superficial femoral, and popliteal veins, as well as the visualized calf veins. Visualized portions of profunda femoral vein and great saphenous vein unremarkable. No filling defects to suggest DVT on grayscale or color Doppler imaging. Doppler waveforms show normal direction of venous flow, normal respiratory plasticity and response to augmentation. Limited  views of the contralateral common femoral vein are unremarkable. OTHER Ill-defined fluid/edema within the subcutaneous soft tissues of both calves. Limitations: none IMPRESSION: 1. No DVT seen, bilateral lower extremities. 2. Fluid/edema within the subcutaneous soft tissues of both calves. Electronically Signed   By: Franki Cabot M.D.   On: 06/17/2020 14:10   DG Chest Port 1 View  Result Date: 06/23/2020 CLINICAL DATA:  Acute respiratory failure. EXAM: PORTABLE CHEST 1 VIEW COMPARISON:  06/22/2020.  CT 06/21/2020. FINDINGS: Endotracheal tube, NG tube, right IJ line stable position. Stable cardiomegaly. Low lung volumes with bibasilar atelectasis. No pleural effusion or pneumothorax. Free air in the right hemidiaphragm best identified by prior CT. IMPRESSION: 1.  Endotracheal tube, NG tube, right IJ line stable position. 2.  Stable cardiomegaly. 3. Low lung volumes with bibasilar atelectasis again noted. Chest is stable from prior exam. 4. Free air under the right hemidiaphragm best identified by prior CT. Electronically Signed   By: Marcello Moores  Register   On: 06/23/2020 07:00   DG Chest Port 1 View  Result Date: 06/22/2020 CLINICAL DATA:  Post intubation EXAM: PORTABLE CHEST 1 VIEW COMPARISON:  06/21/2020, CT 03/07/2020 FINDINGS: Interval  intubation, tip of the endotracheal tube is about 14 mm superior to the carina. Right-sided central venous catheter tip over the cavoatrial region. Esophageal tube tip below the diaphragm but incompletely visualized. Cardiomegaly with aortic atherosclerosis. No pleural effusion or consolidation. Minimal lucency at the right diaphragm could reflect small free air demonstrated on CT. IMPRESSION: 1. Interval intubation with tip of the endotracheal tube about 14 mm superior to the carina. Right IJ central venous catheter tip over the cavoatrial region. No pneumothorax. 2. Cardiomegaly. No edema or infiltrate. 3. Possible small amount of free air beneath the right diaphragm, demonstrated on previous CT. Electronically Signed   By: Donavan Foil M.D.   On: 06/22/2020 00:14   DG Chest Port 1 View  Result Date: 06/21/2020 CLINICAL DATA:  Shortness of breath.  History of COPD. EXAM: PORTABLE CHEST 1 VIEW COMPARISON:  Single-view of the chest 06/17/2020 and 11/13/2019. CT chest 11/06/2019. FINDINGS: The lungs are emphysematous. Mild subsegmental atelectasis is seen in the lung bases. The lungs are otherwise clear. Heart size is normal. Aortic atherosclerosis. No pneumothorax or pleural fluid. No acute bony abnormality. IMPRESSION: No acute disease. Aortic Atherosclerosis (ICD10-I70.0) and Emphysema (ICD10-J43.9). Electronically Signed   By: Inge Rise M.D.   On: 06/21/2020 13:58   DG Chest Port 1 View  Result Date: 06/17/2020 CLINICAL DATA:  Bilateral foot swelling EXAM: PORTABLE CHEST 1 VIEW COMPARISON:  03/17/2020 FINDINGS: Stable mild cardiomegaly. Atherosclerotic calcification of the aortic knob. Mildly hyperexpanded lungs coarsened bibasilar interstitial markings. No focal airspace consolidation. No overt edema. No significant pleural fluid collection. No pneumothorax. IMPRESSION: Stable mild cardiomegaly. No overt edema or focal airspace consolidation. Electronically Signed   By: Davina Poke D.O.   On:  06/17/2020 12:30   [place] EKG SITE RITE  Result Date: 06/26/2020 If Site Rite image not attached, placement could not be confirmed due to current cardiac rhythm.    CBC Recent Labs  Lab 06/22/20 0510 06/22/20 0510 06/22/20 1544 06/23/20 0459 06/24/20 0514 06/25/20 0635 06/26/20 0840  WBC 9.2  --   --  15.8* 12.9* 8.6 8.4  HGB 7.3*   < > 9.6* 8.3* 7.0* 7.4* 8.6*  HCT 23.0*   < > 30.5* 26.2* 22.6* 23.5* 27.8*  PLT PLT CLUMPING NOTED, COLLECT IN CITRATE FOR CBC  --   --  317  207 169 156  MCV 105.0*  --   --  98.9 101.3* 100.0 97.5  MCH 33.3  --   --  31.3 31.4 31.5 30.2  MCHC 31.7  --   --  31.7 31.0 31.5 30.9  RDW 18.3*  --   --  18.6* 17.9* 17.3* 17.4*  LYMPHSABS 1.6  --   --   --   --   --   --   MONOABS 1.1*  --   --   --   --   --   --   EOSABS 0.0  --   --   --   --   --   --   BASOSABS 0.0  --   --   --   --   --   --    < > = values in this interval not displayed.    Chemistries  Recent Labs  Lab 06/21/20 1240 06/21/20 1240 06/22/20 0510 06/22/20 0510 06/22/20 1544 06/23/20 0459 06/24/20 0514 06/25/20 0635 06/26/20 0840  NA 137   < > 137   < > 133* 135 142 145 144  K 3.8   < > 4.2   < > 4.3 4.2 3.2* 3.1* 3.0*  CL 99   < > 102   < > 102 102 109 109 104  CO2 25   < > 24   < > 20* 21* 25 24 27   GLUCOSE 202*   < > 221*   < > 243* 281* 72 96 96  BUN 37*   < > 31*   < > 37* 37* 37* 32* 27*  CREATININE 1.19   < > 1.40*   < > 1.69* 2.01* 2.04* 1.94* 1.88*  CALCIUM 8.4*   < > 7.4*   < > 7.3* 7.0* 7.5* 8.0* 8.3*  MG  --   --  1.6*  --   --  2.2  --  2.3  --   AST 18  --  18  --   --   --  25  --   --   ALT 24  --  21  --   --   --  24  --   --   ALKPHOS 48  --  27*  --   --   --  37*  --   --   BILITOT 1.0  --  0.8  --   --   --  0.6  --   --    < > = values in this interval not displayed.   ------------------------------------------------------------------------------------------------------------------ No results for input(s): CHOL, HDL, LDLCALC, TRIG, CHOLHDL,  LDLDIRECT in the last 72 hours.  Lab Results  Component Value Date   HGBA1C 5.4 06/17/2020   ------------------------------------------------------------------------------------------------------------------ No results for input(s): TSH, T4TOTAL, T3FREE, THYROIDAB in the last 72 hours.  Invalid input(s): FREET3 ------------------------------------------------------------------------------------------------------------------ No results for input(s): VITAMINB12, FOLATE, FERRITIN, TIBC, IRON, RETICCTPCT in the last 72 hours.  Coagulation profile Recent Labs  Lab 06/24/20 0916  INR 1.4*    No results for input(s): DDIMER in the last 72 hours.  Cardiac Enzymes No results for input(s): CKMB, TROPONINI, MYOGLOBIN in the last 168 hours.  Invalid input(s): CK ------------------------------------------------------------------------------------------------------------------    Component Value Date/Time   BNP 87.0 06/17/2020 1134    Ji Fairburn M.D on 06/26/2020 at 7:00 PM  Go to [email or web address] - for contact info  [facility] - Office  [phone]

## 2020-06-26 NOTE — Progress Notes (Signed)
Patient with difficult vascular access. Patient with right IJ that has been in place for 5 days. Attempted to place peripheral IVs in order to remove CVC. Able to place one 20 G IV in left forearm. Right forearm is swollen with fluid collection, making IV attempt improbable due to deep veins under pressure from fluid collection. Patient needs minimum of 3 peripheral IVs to have adequate access to remove IJ. PICC line would be better suited for patient for prolonged therapy/adeqate access.

## 2020-06-26 NOTE — Progress Notes (Signed)
[facility] Progress Note  5 Days Post-Op  Subjective: Less out of NG and tolerating the icy. Ostomy working. UOP better and Cr down.   Objective: Vital signs in last 24 hours: Temp:  [96.9 F (36.1 C)-98.2 F (36.8 C)] 98 F (36.7 C) (08/09 1144) Pulse Rate:  [71-93] 80 (08/09 1144) Resp:  [11-18] 13 (08/09 1144) BP: (136-174)/(53-106) 145/59 (08/09 0900) SpO2:  [96 %-100 %] 100 % (08/09 1144) FiO2 (%):  [28 %] 28 % (08/08 2145) Last BM Date: 06/20/20  Intake/Output from previous day: 08/08 0701 - 08/09 0700 In: 2363 [I.V.:832.8; Blood:1040; IV Piggyback:490.2] Out: 5800 [Urine:4650; Emesis/NG output:1150] Intake/Output this shift: Total I/O In: 152.2 [I.V.:146.3; IV Piggyback:5.9] Out: -   General appearance: alert, cooperative and no distress Resp: normal work of breathing GI: soft, nondistended, appropriately tender, ostomy retracted  Lab Results:  Recent Labs    06/25/20 0635 06/26/20 0840  WBC 8.6 8.4  HGB 7.4* 8.6*  HCT 23.5* 27.8*  PLT 169 156   BMET Recent Labs    06/25/20 0635 06/26/20 0840  NA 145 144  K 3.1* 3.0*  CL 109 104  CO2 24 27  GLUCOSE 96 96  BUN 32* 27*  CREATININE 1.94* 1.88*  CALCIUM 8.0* 8.3*   PT/INR Recent Labs    06/24/20 0916  LABPROT 16.6*  INR 1.4*    Studies/Results: No results found.  Anti-infectives: Anti-infectives (From admission, onward)   Start     Dose/Rate Route Frequency Ordered Stop   06/22/20 0030  piperacillin-tazobactam (ZOSYN) IVPB 3.375 g     Discontinue     3.375 g 12.5 mL/hr over 240 Minutes Intravenous Every 8 hours 06/21/20 1953     06/21/20 1830  cefoTEtan (CEFOTAN) 2 g in sodium chloride 0.9 % 100 mL IVPB        2 g 200 mL/hr over 30 Minutes Intravenous On call to O.R. 06/21/20 1819 06/21/20 2002   06/21/20 1830  fluconazole (DIFLUCAN) IVPB 400 mg  Status:  Discontinued        400 mg 100 mL/hr over 120 Minutes Intravenous Every 24 hours 06/21/20 1828 06/21/20 2340    06/21/20 1630  piperacillin-tazobactam (ZOSYN) IVPB 3.375 g        3.375 g 100 mL/hr over 30 Minutes Intravenous  Once 06/21/20 1615 06/21/20 1751      Assessment/Plan: [name] is a68 yo s/p Ex lap, Hartman's for perforated diverticulitis. Doing better. PRN for pain IS, OOB Needs to move and ambulate if possible Ng out and sips of liquids  Ostomy function starting H&H improved, monitor  UOP improving, Cr down Zosyn until 8/10 for intraabdominal contamination and perforated diverticulitis  PE 10/2019, will restart heparin without bolus, will need hematology as outpatient to see if need to continue   Discussed with Dr. Denton Brick and RN.    LOS: 5 days    Virl Cagey 06/26/2020

## 2020-06-26 NOTE — Progress Notes (Signed)
06/26/20 0400 blood sugar was 93.Marland KitchenMarland Kitchen

## 2020-06-26 NOTE — Progress Notes (Addendum)
Inpatient Diabetes Program Recommendations  AACE/ADA: New Consensus Statement on Inpatient Glycemic Control (2015)  Target Ranges:  Prepandial:   less than 140 mg/dL      Peak postprandial:   less than 180 mg/dL (1-2 hours)      Critically ill patients:  140 - 180 mg/dL   Results for TYRIQ, MORAGNE (MRN [id number]) as of 06/26/2020 11:19  Ref. Range 06/24/2020 23:26 06/25/2020 00:12 06/25/2020 05:21 06/25/2020 06:12 06/25/2020 08:05 06/25/2020 11:02 06/25/2020 15:00 06/25/2020 19:46  Glucose-Capillary Latest Ref Range: 70 - 99 mg/dL 59 (L) 82 53 (L) 122 (H) 72 63 (L) 74 58 (L)    Home DM Meds: Amaryl 2 mg daily        Metformin 500 mg BID    Current Orders: Levemir 7 units BID       Novolog Moderate Correction Scale/ SSI (0-15 units) Q4 hours    MD- Note patient had several Hypoglycemic events yesterday.  Please consider reducing Levemir to 4 units BID (50% reduction)     --Will follow patient during hospitalization--  Wyn Quaker RN, MSN, CDE Diabetes Coordinator Inpatient Glycemic Control Team Team Pager: [phone] (8a-5p)

## 2020-06-26 NOTE — Progress Notes (Signed)
Verbal order to place PICC line per Dr Denton Brick. Will place PICC line tomorrow 8/10 in the morning to replace right IJ CVC.

## 2020-06-26 NOTE — Consult Note (Addendum)
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  Used 1 piece flat and barrier ring.   Stomal assessment/size: Flush, dark moist stoma. Producing soft green stool. NG tube in place. OVal  2 cm x 3 cm  Peristomal assessment:  Treatment options for stomal/peristomal skin: flat 1 piece pouch and barrier ring Output soft green stool Ostomy pouching: 1pc.flat with barrier ring Going to order 1 piece flexible convex as stoma is flush. [facility] # [id number].  No supplies were in room.  I am ordering supplies today.  Education provided: Patient lives alone and states his daughter lives next door and has sisters available to help.  Patient minimally participative in learning today.  I have asked when family can be present to view and learn ostomy care and he is agreeable to WEdnesday at 1 PM. I have written a message on the message board in room as a reminder and teaching will resume then.   Enrolled patient in [place] program: No WOC team will follow.  Domenic Moras MSN, RN, FNP-BC CWON Wound, Ostomy, Continence Nurse Pager [phone]

## 2020-06-26 NOTE — Progress Notes (Signed)
ANTICOAGULATION CONSULT NOTE - Initial Consult  Pharmacy Consult for heparin Indication: Afib/hx of PE  Allergies  Allergen Reactions  . Other     Patient reports he was allergic to something in an IV he was given but does not know what the substance was. As of 01/19/2020     Patient Measurements: Height: 5\' 9"  (175.3 cm) Weight: 122 kg (268 lb 15.4 oz) IBW/kg (Calculated) : 70.7 Heparin Dosing Weight: 97 kg  Vital Signs: Temp: 98.5 F (36.9 C) (08/09 1614) Temp Source: Oral (08/09 1614) BP: 150/62 (08/09 1930) Pulse Rate: 83 (08/09 1930)  Labs: Recent Labs    06/24/20 0514 06/24/20 0514 06/24/20 0916 06/25/20 0635 06/26/20 0840 06/26/20 2052  HGB 7.0*   < >  --  7.4* 8.6*  --   HCT 22.6*  --   --  23.5* 27.8*  --   PLT 207  --   --  169 156  --   APTT  --   --  30  --   --   --   LABPROT  --   --  16.6*  --   --   --   INR  --   --  1.4*  --   --   --   HEPARINUNFRC  --   --   --   --   --  0.76*  CREATININE 2.04*  --   --  1.94* 1.88*  --    < > = values in this interval not displayed.    Estimated Creatinine Clearance: 41.8 mL/min (A) (by C-G formula based on SCr of 1.88 mg/dL (H)).   Medical History: Past Medical History:  Diagnosis Date  . Chronic gout   . COPD (chronic obstructive pulmonary disease) ([place])    Oxygen dependent  . 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 CAD-RADS 4 - Severe.  ? >50% LM stenosis -->CTFFR -- NOT PHYSIOLOGICALLY SIGNIFICANT; Large Dom RCA-<PDA/PLA.  Diffuse mild to mod plaque (25-49%) prox segment and mod (50-69%) mid-distal;  CTFFR: prox 0.95, mid 0.88, distal 0.86 (not significant); Med-sized LAD w/ long diffuse mod-severe plaque calcified plaque in prox-mid ~ 50 -69%, ? >70% -> CTFFR :   . Diabetes mellitus type II, non insulin dependent ([place])   . Hypertension   . Morbid obesity (HCC)    BMI of 38.5 with multiple risk factors.  . OSA on CPAP   .  Pulmonary emboli ([place]) 10/2019   Chest CTA-4 Phadke opacification of main PA but there is partially occlusive main posterior RLL and segmental/segmental branches.  Small thrombus noted in the anterior right middle lobe.  No RV strain.  Scattered aortic atherosclerosis involving great vessels.  Coronary calcification noted.  Marland Kitchen RLS (restless legs syndrome)     Medications:  Medications Prior to Admission  Medication Sig Dispense Refill Last Dose  . albuterol (VENTOLIN HFA) 108 (90 Base) MCG/ACT inhaler Inhale 1 puff into the lungs every 6 (six) hours as needed for wheezing or shortness of breath.   06/21/2020  . apixaban (ELIQUIS) 5 MG TABS tablet Take 1 tablet (5 mg total) by mouth 2 (two) times daily.   06/21/2020 at 0730  . cefdinir (OMNICEF) 300 MG capsule Take 1 capsule (300 mg total) by mouth every 12 (twelve) hours. 14 capsule 0 06/21/2020  . diltiazem (CARDIZEM CD) 180 MG 24 hr capsule Take 2 capsules (360 mg total) by mouth daily. (Patient taking  differently: Take 360 mg by mouth at bedtime. ) 90 capsule 2 06/20/2020 at Unknown time  . doxycycline (VIBRAMYCIN) 100 MG capsule Take 100 mg by mouth 2 (two) times daily. For 10 days   06/21/2020  . glimepiride (AMARYL) 2 MG tablet Take 2 mg by mouth daily with breakfast.   06/21/2020  . guaiFENesin (MUCINEX) 600 MG 12 hr tablet Take 1 tablet (600 mg total) by mouth 2 (two) times daily. 30 tablet 0 Past Month at Unknown time  . lisinopril (ZESTRIL) 40 MG tablet Take 0.5 tablets (20 mg total) by mouth daily. (Patient taking differently: Take 20 mg by mouth at bedtime. ) 30 tablet 6 06/21/2020  . metFORMIN (GLUCOPHAGE) 500 MG tablet Take 500 mg by mouth 2 (two) times daily with a meal.   06/21/2020  . nitroGLYCERIN (NITROSTAT) 0.4 MG SL tablet Place 1 tablet (0.4 mg total) under the tongue every 5 (five) minutes as needed for chest pain. 90 tablet 3 06/21/2020 at Unknown time  . predniSONE (DELTASONE) 10 MG tablet Take 6 tablets (60 mg total) by mouth daily with  breakfast. And decrease by one tablet daily 21 tablet 0 06/21/2020 at Unknown time  . rosuvastatin (CRESTOR) 40 MG tablet Take 1 tablet (40 mg total) by mouth daily. 90 tablet 3 06/21/2020 at Unknown time  . torsemide (DEMADEX) 20 MG tablet Take 20 mg by mouth 2 (two) times daily.   06/21/2020  . TRELEGY ELLIPTA 100-62.5-25 MCG/INH AEPB Inhale 1 puff into the lungs daily.   06/21/2020  . triamcinolone cream (KENALOG) 0.1 % Apply 1 application topically 2 (two) times daily.   06/21/2020    Assessment: Pharmacy consulted to dose heparin in patient with atrial fibrillation/hx of PE.  Patient is on apixaban prior to admission with last dose given 8/4 0730.  Apixaban was being held due to acute symptomatic anemia from blood loss.  Heparin level came back supratherapeutic tonight at 0.76. We will reduce the rate and recheck in AM.   Goal of Therapy:  Heparin level 0.3-0.7 units/ml Monitor platelets by anticoagulation protocol: Yes   Plan:   Decrease heparin infusion to 1250 units/hr Check AM heparin level Continue to monitor H&H and platelets  Onnie Boer, PharmD, BCIDP, AAHIVP, CPP Infectious Disease Pharmacist 06/26/2020 9:55 PM

## 2020-06-26 NOTE — Progress Notes (Signed)
ANTICOAGULATION CONSULT NOTE - Initial Consult  Pharmacy Consult for heparin Indication: Afib/hx of PE  Allergies  Allergen Reactions  . Other     Patient reports he was allergic to something in an IV he was given but does not know what the substance was. As of 01/19/2020     Patient Measurements: Height: 5\' 9"  (175.3 cm) Weight: 122 kg (268 lb 15.4 oz) IBW/kg (Calculated) : 70.7 Heparin Dosing Weight: 97 kg  Vital Signs: Temp: 98 F (36.7 C) (08/09 1144) Temp Source: Oral (08/09 1144) BP: 145/59 (08/09 0900) Pulse Rate: 80 (08/09 1144)  Labs: Recent Labs    06/24/20 0514 06/24/20 0514 06/24/20 0916 06/25/20 0635 06/26/20 0840  HGB 7.0*   < >  --  7.4* 8.6*  HCT 22.6*  --   --  23.5* 27.8*  PLT 207  --   --  169 156  APTT  --   --  30  --   --   LABPROT  --   --  16.6*  --   --   INR  --   --  1.4*  --   --   CREATININE 2.04*  --   --  1.94* 1.88*   < > = values in this interval not displayed.    Estimated Creatinine Clearance: 41.8 mL/min (A) (by C-G formula based on SCr of 1.88 mg/dL (H)).   Medical History: Past Medical History:  Diagnosis Date  . Chronic gout   . COPD (chronic obstructive pulmonary disease) ([place])    Oxygen dependent  . 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 CAD-RADS 4 - Severe.  ? >50% LM stenosis -->CTFFR -- NOT PHYSIOLOGICALLY SIGNIFICANT; Large Dom RCA-<PDA/PLA.  Diffuse mild to mod plaque (25-49%) prox segment and mod (50-69%) mid-distal;  CTFFR: prox 0.95, mid 0.88, distal 0.86 (not significant); Med-sized LAD w/ long diffuse mod-severe plaque calcified plaque in prox-mid ~ 50 -69%, ? >70% -> CTFFR :   . Diabetes mellitus type II, non insulin dependent ([place])   . Hypertension   . Morbid obesity (HCC)    BMI of 38.5 with multiple risk factors.  . OSA on CPAP   . Pulmonary emboli ([place]) 10/2019   Chest CTA-4 Phadke opacification of main PA but there is partially  occlusive main posterior RLL and segmental/segmental branches.  Small thrombus noted in the anterior right middle lobe.  No RV strain.  Scattered aortic atherosclerosis involving great vessels.  Coronary calcification noted.  Marland Kitchen RLS (restless legs syndrome)     Medications:  Medications Prior to Admission  Medication Sig Dispense Refill Last Dose  . albuterol (VENTOLIN HFA) 108 (90 Base) MCG/ACT inhaler Inhale 1 puff into the lungs every 6 (six) hours as needed for wheezing or shortness of breath.   06/21/2020  . apixaban (ELIQUIS) 5 MG TABS tablet Take 1 tablet (5 mg total) by mouth 2 (two) times daily.   06/21/2020 at 0730  . cefdinir (OMNICEF) 300 MG capsule Take 1 capsule (300 mg total) by mouth every 12 (twelve) hours. 14 capsule 0 06/21/2020  . diltiazem (CARDIZEM CD) 180 MG 24 hr capsule Take 2 capsules (360 mg total) by mouth daily. (Patient taking differently: Take 360 mg by mouth at bedtime. ) 90 capsule 2 06/20/2020 at Unknown time  . doxycycline (VIBRAMYCIN) 100 MG capsule Take 100 mg by mouth 2 (two) times daily. For 10 days   06/21/2020  . glimepiride (  AMARYL) 2 MG tablet Take 2 mg by mouth daily with breakfast.   06/21/2020  . guaiFENesin (MUCINEX) 600 MG 12 hr tablet Take 1 tablet (600 mg total) by mouth 2 (two) times daily. 30 tablet 0 Past Month at Unknown time  . lisinopril (ZESTRIL) 40 MG tablet Take 0.5 tablets (20 mg total) by mouth daily. (Patient taking differently: Take 20 mg by mouth at bedtime. ) 30 tablet 6 06/21/2020  . metFORMIN (GLUCOPHAGE) 500 MG tablet Take 500 mg by mouth 2 (two) times daily with a meal.   06/21/2020  . nitroGLYCERIN (NITROSTAT) 0.4 MG SL tablet Place 1 tablet (0.4 mg total) under the tongue every 5 (five) minutes as needed for chest pain. 90 tablet 3 06/21/2020 at Unknown time  . predniSONE (DELTASONE) 10 MG tablet Take 6 tablets (60 mg total) by mouth daily with breakfast. And decrease by one tablet daily 21 tablet 0 06/21/2020 at Unknown time  . rosuvastatin  (CRESTOR) 40 MG tablet Take 1 tablet (40 mg total) by mouth daily. 90 tablet 3 06/21/2020 at Unknown time  . torsemide (DEMADEX) 20 MG tablet Take 20 mg by mouth 2 (two) times daily.   06/21/2020  . TRELEGY ELLIPTA 100-62.5-25 MCG/INH AEPB Inhale 1 puff into the lungs daily.   06/21/2020  . triamcinolone cream (KENALOG) 0.1 % Apply 1 application topically 2 (two) times daily.   06/21/2020    Assessment: Pharmacy consulted to dose heparin in patient with atrial fibrillation/hx of PE.  Patient is on apixaban prior to admission with last dose given 8/4 0730.  Apixaban was being held due to acute symptomatic anemia from blood loss.  Goal of Therapy:  Heparin level 0.3-0.7 units/ml Monitor platelets by anticoagulation protocol: Yes   Plan:  No bolus Start heparin infusion at 1400 units/hr Check anti-Xa level in 8 hours and daily while on heparin Continue to monitor H&H and platelets   Margot Ables, PharmD Clinical Pharmacist 06/26/2020 12:30 PM

## 2020-06-27 LAB — BASIC METABOLIC PANEL
Anion gap: 11 (ref 5–15)
BUN: 24 mg/dL — ABNORMAL HIGH (ref 8–23)
CO2: 28 mmol/L (ref 22–32)
Calcium: 8.2 mg/dL — ABNORMAL LOW (ref 8.9–10.3)
Chloride: 104 mmol/L (ref 98–111)
Creatinine, Ser: 1.76 mg/dL — ABNORMAL HIGH (ref 0.61–1.24)
GFR calc Af Amer: 42 mL/min — ABNORMAL LOW (ref >60)
GFR calc non Af Amer: 36 mL/min — ABNORMAL LOW (ref >60)
Glucose, Bld: 82 mg/dL (ref 70–99)
Potassium: 3.6 mmol/L (ref 3.5–5.1)
Sodium: 143 mmol/L (ref 135–145)

## 2020-06-27 LAB — HEPARIN LEVEL (UNFRACTIONATED)
Heparin Unfractionated: 0.72 IU/mL — ABNORMAL HIGH (ref 0.30–0.70)
Heparin Unfractionated: 0.31 IU/mL (ref 0.30–0.70)
Heparin Unfractionated: 0.34 IU/mL (ref 0.30–0.70)

## 2020-06-27 LAB — GLUCOSE, CAPILLARY
Glucose-Capillary: 82 mg/dL (ref 70–99)
Glucose-Capillary: 80 mg/dL (ref 70–99)
Glucose-Capillary: 99 mg/dL (ref 70–99)
Glucose-Capillary: 183 mg/dL — ABNORMAL HIGH (ref 70–99)
Glucose-Capillary: 146 mg/dL — ABNORMAL HIGH (ref 70–99)

## 2020-06-27 LAB — CBC
HCT: 28.4 % — ABNORMAL LOW (ref 39.0–52.0)
Hemoglobin: 9 g/dL — ABNORMAL LOW (ref 13.0–17.0)
MCH: 30.5 pg (ref 26.0–34.0)
MCHC: 31.7 g/dL (ref 30.0–36.0)
MCV: 96.3 fL (ref 80.0–100.0)
Platelets: 172 10*3/uL (ref 150–400)
RBC: 2.95 MIL/uL — ABNORMAL LOW (ref 4.22–5.81)
RDW: 16.8 % — ABNORMAL HIGH (ref 11.5–15.5)
WBC: 8.9 10*3/uL (ref 4.0–10.5)
nRBC: 0 % (ref 0.0–0.2)

## 2020-06-27 MED ORDER — METOPROLOL TARTRATE 25 MG PO TABS
25.0000 mg | ORAL_TABLET | Freq: Two times a day (BID) | ORAL | Status: DC
  Administered 2020-06-27 – 2020-06-29 (×5): 25 mg via ORAL
  Filled 2020-06-27 (×5): qty 1

## 2020-06-27 NOTE — Plan of Care (Addendum)
  Problem: Acute Rehab PT Goals(only PT should resolve) Goal: Pt will Roll Supine to Side Outcome: Progressing Flowsheets (Taken 06/27/2020 0943) Pt will Roll Supine to Side: with modified independence Goal: Pt Will Go Supine/Side To Sit Outcome: Progressing Flowsheets (Taken 06/27/2020 0943) Pt will go Supine/Side to Sit: . with supervision . with modified independence Goal: Patient Will Transfer Sit To/From Stand Outcome: Progressing Flowsheets (Taken 06/27/2020 [phone]) Patient will transfer sit to/from stand: with modified independence Goal: Pt Will Transfer Bed To Chair/Chair To Bed Outcome: Progressing Flowsheets (Taken 06/27/2020 0943) Pt will Transfer Bed to Chair/Chair to Bed: with modified independence Goal: Pt Will Ambulate Outcome: Progressing Flowsheets (Taken 06/27/2020 0943) Pt will Ambulate: . 75 feet . with modified independence . with rolling walker   9:44 AM , 06/27/20 Karlyn Agee, SPT Physical Therapy with [facility] [phone] office  During this treatment session, the therapist was present, participating in and directing the treatment.  10:07 AM, 06/27/20 Lonell Grandchild, MPT Physical Therapist with [facility] [phone] office [phone] mobile phone

## 2020-06-27 NOTE — TOC Progression Note (Addendum)
Transition of Care [facility]) - Progression Note   Patient Details  Name: Vincent Cooper MRN: [id number] Date of Birth: [birth year]  Transition [facility]) CM/SW [place], LCSW Phone Number: 06/27/2020, 2:09 PM  Clinical Narrative: PT evaluation recommends SNF placement for rehab. CSW spoke with patient's daughter, Vincent Cooper, to discuss SNF. Family agreeable to SNF and first choice is [facility]. FL2 completed and PASRR received. Patient faxed out; awaiting bed offers.  CSW called Bernadene Bell to start insurance authorization. Patient's reference # is: [id number]. Clinicals faxed to [facility].  Expected Discharge Plan: [place] Barriers to Discharge: Continued Medical Work up  Expected Discharge Plan and Services Expected Discharge Plan: [place] In-house Referral: Clinical Social Work Post Acute Care Choice: Resumption of Svcs/PTA Provider Living arrangements for the past 2 months: Single Family Home  Readmission Risk Interventions Readmission Risk Prevention Plan 06/23/2020  Transportation Screening Complete  HRI or Home Care Consult Complete  Palliative Care Screening Not Applicable  Medication Review (RN Care Manager) Complete

## 2020-06-27 NOTE — Progress Notes (Signed)
ANTICOAGULATION CONSULT NOTE - Follow Up Consult  Pharmacy Consult for heparin Indication: Afib and h/o PE  Labs: Recent Labs    06/25/20 0635 06/25/20 0635 06/26/20 0840 06/26/20 2052 06/27/20 0504 06/27/20 1400 06/27/20 2141  HGB 7.4*   < > 8.6*  --  9.0*  --   --   HCT 23.5*  --  27.8*  --  28.4*  --   --   PLT 169  --  156  --  172  --   --   HEPARINUNFRC  --   --   --    < > 0.72* 0.31 0.34  CREATININE 1.94*  --  1.88*  --  1.76*  --   --    < > = values in this interval not displayed.    Assessment/Plan:  79yo male remains therapeutic on heparin. Will continue gtt at current rate and monitor daily level.   Wynona Neat, PharmD, BCPS  06/27/2020,11:15 PM

## 2020-06-27 NOTE — Progress Notes (Signed)
Attempted midline, RUA. Able to access vein and place guidewire, but unable to thread catheter into vein due to resistance. Second peripheral IV placed in right forearm. MD aware.

## 2020-06-27 NOTE — Progress Notes (Signed)
[facility] Progress Note  6 Days Post-Op  Subjective: Tolerated clear and having more output in ostomy. No nausea. Wants ice cream or orange juice.   Objective: Vital signs in last 24 hours: Temp:  [97.8 F (36.6 C)-98.5 F (36.9 C)] 97.8 F (36.6 C) (08/10 0710) Pulse Rate:  [70-85] 79 (08/10 0710) Resp:  [11-18] 18 (08/10 0710) BP: (83-151)/(57-89) 142/79 (08/10 0630) SpO2:  [77 %-100 %] 96 % (08/10 0710) Weight:  [115.6 kg] 115.6 kg (08/10 0500) Last BM Date: 06/20/20  Intake/Output from previous day: 08/09 0701 - 08/10 0700 In: 633.4 [P.O.:240; I.V.:315.3; IV Piggyback:78.1] Out: 4150 [Urine:3950; Stool:200] Intake/Output this shift: No intake/output data recorded.  General appearance: alert, cooperative and no distress Resp: normal work of breathing GI: soft, nondistended, midline wound with no erythema or draiange, ostomy with stool in bag  Lab Results:  Recent Labs    06/26/20 0840 06/27/20 0504  WBC 8.4 8.9  HGB 8.6* 9.0*  HCT 27.8* 28.4*  PLT 156 172   BMET Recent Labs    06/26/20 0840 06/27/20 0504  NA 144 143  K 3.0* 3.6  CL 104 104  CO2 27 28  GLUCOSE 96 82  BUN 27* 24*  CREATININE 1.88* 1.76*  CALCIUM 8.3* 8.2*   PT/INR Recent Labs    06/24/20 0916  LABPROT 16.6*  INR 1.4*    Studies/Results: [place] EKG SITE RITE  Result Date: 06/26/2020 If Site Rite image not attached, placement could not be confirmed due to current cardiac rhythm.   Anti-infectives: Anti-infectives (From admission, onward)   Start     Dose/Rate Route Frequency Ordered Stop   06/22/20 0030  piperacillin-tazobactam (ZOSYN) IVPB 3.375 g     Discontinue     3.375 g 12.5 mL/hr over 240 Minutes Intravenous Every 8 hours 06/21/20 1953     06/21/20 1830  cefoTEtan (CEFOTAN) 2 g in sodium chloride 0.9 % 100 mL IVPB        2 g 200 mL/hr over 30 Minutes Intravenous On call to O.R. 06/21/20 1819 06/21/20 2002   06/21/20 1830  fluconazole (DIFLUCAN) IVPB  400 mg  Status:  Discontinued        400 mg 100 mL/hr over 120 Minutes Intravenous Every 24 hours 06/21/20 1828 06/21/20 2340   06/21/20 1630  piperacillin-tazobactam (ZOSYN) IVPB 3.375 g        3.375 g 100 mL/hr over 30 Minutes Intravenous  Once 06/21/20 1615 06/21/20 1751      Assessment/Plan: Mr. Debby Bud is a7 yo s/p Ex lap, Hartman's for perforated diverticulitis.  PRN for pain IS, OOB PT working today  Full liquids  Ostomy function starting H&H stabilized on heparin gtt for PE history  UOP improving, Cr down, can get foley out at Dr. Denton Brick discretion Zosyn until 8/10 for intraabdominal contamination and perforated diverticulitis  CVL in place R IJ, can likely dc in next 24 hrs also   Discussed with Dr. Denton Brick and RN.   LOS: 6 days    Virl Cagey 06/27/2020

## 2020-06-27 NOTE — Evaluation (Addendum)
Physical Therapy Evaluation Patient Details Name: Vincent Cooper MRN: [id number] DOB: May 03, 1941 Today's Date: 06/27/2020   History of Present Illness  Vincent Cooper is a 79 y.o. male with medical history of morbid obesity, OSA, hypertension, hyperlipidemia, diabetes mellitus type 2, diastolic CHF presenting with 3-week history of leg edema associated with approximately 1 week history of leg erythema.  Patient states that in the past 2 to 3 days he has had worsening erythema.  He went to see his primary care provider on 06/15/2020.  The patient was prescribed 2 antibiotics which he took.  He stated that the redness in his legs continue to migrate proximally.  He had some mild pain.  He has noticed increasing edema over the past 2 to 3 days in his bilateral lower extremities.  He denies any fevers, chills, chest pain, nausea, vomiting, diarrhea.  He has had some worsening dyspnea on exertion.  However, the patient has been very compliant with his furosemide and cardiac medications.  He states that his weight has actually been decreasing from 260 pounds from his last visit with Dr. [name] to 246 pounds on 06/16/2020.  He states that he normally has to sleep in a recliner, but he states that that he is not able to actually lay back somewhat without any orthopnea type symptoms.  He denies any coughing, hemoptysis, recent travels.  There is no nausea, vomiting, diarrhea.  The patient states that his PCP recently decreased his furosemide from 40 mg to 20 mg daily approximately 3 days prior to this admission.In the emergency department, the patient was afebrile hemodynamically stable with oxygen saturation 95-100% room air.  Potassium was 2.2 with serum creatinine 1.50.  LFTs were unremarkable.  WBC 9.7, hemoglobin 10.2, platelets 245,000.  BMP was 87.  Chest x-ray showed chronic interstitial prominence.  The patient was given 500 cc normal saline.  Potassium chloride 40 mEq p.o. and 30 mEq IV were ordered. (Ostomy surgery  and laproscopy 06/21/20.)    Clinical Impression  The patient had difficulty performing bed rolling and side-lying to sit transfer while adhering to abdominal precautions due to decreased strength today. The patient was limited to 25 feet of ambulation due to significant fatigue. O2 sat remained stable at 95% on room air. The patient received education on the importance of regular ambulation and correct body mechanics for bed mobility. Patient tolerated sitting up in chair with call bell within arm's reach after therapy. PLAN: The patient will continue to benefit from skilled physical therapy services in hospital at recommended venue below in order to improve balance, gait, and ADL's to promote independence in functional activities and improve adherence to abdominal precautions.     Follow Up Recommendations SNF    Equipment Recommendations  None recommended by PT    Recommendations for Other Services   None recommended by PT.     Precautions / Restrictions Precautions Precautions: Other (comment) (abdominal precautions from ostomy) Precaution Comments: use walker      Mobility  Bed Mobility Overal bed mobility: Needs Assistance Bed Mobility: Sidelying to Sit;Rolling Rolling: Min assist Sidelying to sit: Mod assist          Transfers Overall transfer level: Modified independent Equipment used: Rolling walker (2 wheeled)                Ambulation/Gait Ambulation/Gait assistance: Min guard Gait Distance (Feet): 25 Feet Assistive device: Rolling walker (2 wheeled) Gait Pattern/deviations: WFL(Within Functional Limits) Gait velocity: decreased w SOB Gait velocity interpretation: <1.8 ft/sec, indicate  of risk for recurrent falls General Gait Details: fatigue limited ambulation endurance  Stairs            Wheelchair Mobility    Modified Rankin (Stroke Patients Only)       Balance Overall balance assessment: Modified Independent                                            Pertinent Vitals/Pain Pain Assessment: No/denies pain    Home Living Family/patient expects to be discharged to:: Private residence Living Arrangements: Spouse/significant other Available Help at Discharge: Family;Available PRN/intermittently Type of Home: House       Home Layout: One level Home Equipment: [place] - 2 wheels;Shower seat;Adaptive equipment;Cane - single point      Prior Function Level of Independence: Independent with assistive device(s)         Comments: Pt reports that his daughter will assist him with laundry and heavy household cleaning. He is able to prepare meals (he loves to cook), and he can do light cleaning.     Hand Dominance   Dominant Hand: Right    Extremity/Trunk Assessment   Upper Extremity Assessment Upper Extremity Assessment: Generalized weakness (difficulty with sidelying to sit transfer using UE's for assistance)    Lower Extremity Assessment Lower Extremity Assessment: Overall WFL for tasks assessed    Cervical / Trunk Assessment Cervical / Trunk Assessment: Normal  Communication   Communication: No difficulties  Cognition Arousal/Alertness: Awake/alert Behavior During Therapy: WFL for tasks assessed/performed Overall Cognitive Status: Within Functional Limits for tasks assessed                                        General Comments      Exercises     Assessment/Plan    PT Assessment Patient needs continued PT services  PT Problem List Decreased strength;Decreased mobility;Obesity;Cardiopulmonary status limiting activity;Decreased activity tolerance       PT Treatment Interventions Functional mobility training;Therapeutic activities;Patient/family education;Modalities;Gait training;DME instruction;Balance training;Therapeutic exercise    PT Goals (Current goals Vincent be found in the Care Plan section)  Acute Rehab PT Goals Patient Stated Goal: SNF then home w  assistance PT Goal Formulation: With patient Time For Goal Achievement: 06/27/20 Potential to Achieve Goals: Good    Frequency Min 3X/week   Barriers to discharge        Co-evaluation               AM-PAC PT "6 Clicks" Mobility  Outcome Measure Help needed turning from your back to your side while in a flat bed without using bedrails?: A Little Help needed moving from lying on your back to sitting on the side of a flat bed without using bedrails?: A Lot Help needed moving to and from a bed to a chair (including a wheelchair)?: A Little Help needed standing up from a chair using your arms (e.g., wheelchair or bedside chair)?: A Little Help needed to walk in hospital room?: A Little Help needed climbing 3-5 steps with a railing? : A Lot 6 Click Score: 16    End of Session   Activity Tolerance: Patient limited by fatigue Patient left: in chair;with call bell/phone within reach Nurse Communication: Mobility status;Precautions;Weight bearing status PT Visit Diagnosis: Muscle weakness (generalized) (M62.81);Unsteadiness on feet (R26.81);Other abnormalities  of gait and mobility (R26.89)    Time: 4591-3685 PT Time Calculation (min) (ACUTE ONLY): 27 min   Charges:   PT Evaluation $PT Eval Moderate Complexity: 1 Mod PT Treatments $Therapeutic Activity: 23-37 mins        10:02 AM , 06/27/20 Karlyn Agee, SPT Physical Therapy with [place]  [facility] [phone] office   During this treatment session, the therapist was present, participating in and directing the treatment.  10:02 AM, 06/27/20 Lonell Grandchild, MPT Physical Therapist with [facility] 336 [phone] office [phone] mobile phone

## 2020-06-27 NOTE — Progress Notes (Signed)
[place] for heparin Indication: Afib/hx of PE  Assessment: Pharmacy consulted to dose heparin in patient with atrial fibrillation/hx of PE.  Patient is on apixaban prior to admission with last dose given 8/4 0730.  Apixaban was being held due to acute symptomatic anemia from blood loss.  Heparin level this am 0.72 units/ml  Goal of Therapy:  Heparin level 0.3-0.7 units/ml Monitor platelets by anticoagulation protocol: Yes   Plan:  Decrease heparin infusion to 1150 units/hr Check heparin level in 6 hr Continue to monitor H&H and platelets  Thanks for allowing pharmacy to be a part of this patient's care.  Excell Seltzer, PharmD Clinical Pharmacist  06/27/2020 6:43 AM

## 2020-06-27 NOTE — Progress Notes (Signed)
ANTICOAGULATION CONSULT NOTE -   Pharmacy Consult for heparin Indication: Afib/hx of PE  Allergies  Allergen Reactions  . Other     Patient reports he was allergic to something in an IV he was given but does not know what the substance was. As of 01/19/2020     Patient Measurements: Height: 5\' 9"  (175.3 cm) Weight: 115.6 kg (254 lb 13.6 oz) IBW/kg (Calculated) : 70.7 Heparin Dosing Weight: 97 kg  Vital Signs: Temp: 97.9 F (36.6 C) (08/10 1107) Temp Source: Axillary (08/10 1107) BP: 142/79 (08/10 0630) Pulse Rate: 95 (08/10 1107)  Labs: Recent Labs    06/25/20 6433 06/25/20 2951 06/26/20 0840 06/26/20 2052 06/27/20 0504 06/27/20 1400  HGB 7.4*   < > 8.6*  --  9.0*  --   HCT 23.5*  --  27.8*  --  28.4*  --   PLT 169  --  156  --  172  --   HEPARINUNFRC  --   --   --  0.76* 0.72* 0.31  CREATININE 1.94*  --  1.88*  --  1.76*  --    < > = values in this interval not displayed.    Estimated Creatinine Clearance: 43.4 mL/min (A) (by C-G formula based on SCr of 1.76 mg/dL (H)).   Medical History: Past Medical History:  Diagnosis Date  . Chronic gout   . COPD (chronic obstructive pulmonary disease) ([place])    Oxygen dependent  . 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 CAD-RADS 4 - Severe.  ? >50% LM stenosis -->CTFFR -- NOT PHYSIOLOGICALLY SIGNIFICANT; Large Dom RCA-<PDA/PLA.  Diffuse mild to mod plaque (25-49%) prox segment and mod (50-69%) mid-distal;  CTFFR: prox 0.95, mid 0.88, distal 0.86 (not significant); Med-sized LAD w/ long diffuse mod-severe plaque calcified plaque in prox-mid ~ 50 -69%, ? >70% -> CTFFR :   . Diabetes mellitus type II, non insulin dependent ([place])   . Hypertension   . Morbid obesity (HCC)    BMI of 38.5 with multiple risk factors.  . OSA on CPAP   . Pulmonary emboli ([place]) 10/2019   Chest CTA-4 [place] opacification of main PA but there is partially occlusive main  posterior RLL and segmental/segmental branches.  Small thrombus noted in the anterior right middle lobe.  No RV strain.  Scattered aortic atherosclerosis involving great vessels.  Coronary calcification noted.  Marland Kitchen RLS (restless legs syndrome)     Medications:  Medications Prior to Admission  Medication Sig Dispense Refill Last Dose  . albuterol (VENTOLIN HFA) 108 (90 Base) MCG/ACT inhaler Inhale 1 puff into the lungs every 6 (six) hours as needed for wheezing or shortness of breath.   06/21/2020  . apixaban (ELIQUIS) 5 MG TABS tablet Take 1 tablet (5 mg total) by mouth 2 (two) times daily.   06/21/2020 at 0730  . cefdinir (OMNICEF) 300 MG capsule Take 1 capsule (300 mg total) by mouth every 12 (twelve) hours. 14 capsule 0 06/21/2020  . diltiazem (CARDIZEM CD) 180 MG 24 hr capsule Take 2 capsules (360 mg total) by mouth daily. (Patient taking differently: Take 360 mg by mouth at bedtime. ) 90 capsule 2 06/20/2020 at Unknown time  . doxycycline (VIBRAMYCIN) 100 MG capsule Take 100 mg by mouth 2 (two) times daily. For 10 days   06/21/2020  . glimepiride (AMARYL) 2 MG tablet Take 2 mg by mouth daily with breakfast.   06/21/2020  . guaiFENesin (  MUCINEX) 600 MG 12 hr tablet Take 1 tablet (600 mg total) by mouth 2 (two) times daily. 30 tablet 0 Past Month at Unknown time  . lisinopril (ZESTRIL) 40 MG tablet Take 0.5 tablets (20 mg total) by mouth daily. (Patient taking differently: Take 20 mg by mouth at bedtime. ) 30 tablet 6 06/21/2020  . metFORMIN (GLUCOPHAGE) 500 MG tablet Take 500 mg by mouth 2 (two) times daily with a meal.   06/21/2020  . nitroGLYCERIN (NITROSTAT) 0.4 MG SL tablet Place 1 tablet (0.4 mg total) under the tongue every 5 (five) minutes as needed for chest pain. 90 tablet 3 06/21/2020 at Unknown time  . predniSONE (DELTASONE) 10 MG tablet Take 6 tablets (60 mg total) by mouth daily with breakfast. And decrease by one tablet daily 21 tablet 0 06/21/2020 at Unknown time  . rosuvastatin (CRESTOR) 40 MG tablet  Take 1 tablet (40 mg total) by mouth daily. 90 tablet 3 06/21/2020 at Unknown time  . torsemide (DEMADEX) 20 MG tablet Take 20 mg by mouth 2 (two) times daily.   06/21/2020  . TRELEGY ELLIPTA 100-62.5-25 MCG/INH AEPB Inhale 1 puff into the lungs daily.   06/21/2020  . triamcinolone cream (KENALOG) 0.1 % Apply 1 application topically 2 (two) times daily.   06/21/2020    Assessment: Pharmacy consulted to dose heparin in patient with atrial fibrillation/hx of PE.  Patient is on apixaban prior to admission with last dose given 8/4 0730.  Heparin level 0.31- therapeutic x 1  Goal of Therapy:  Heparin level 0.3-0.7 units/ml Monitor platelets by anticoagulation protocol: Yes   Plan:  Continue heparin infusion at 1150 units/hr. Check heparin level in 8 hours and daily. Continue to monitor H&H and platelets.  Margot Ables, PharmD Clinical Pharmacist 06/27/2020 2:37 PM

## 2020-06-27 NOTE — NC FL2 (Signed)
[place] MEDICAID FL2 LEVEL OF CARE SCREENING TOOL     IDENTIFICATION  Patient Name: Vincent Cooper Birthdate: 1941-09-01 Sex: male Admission Date (Current Location): 06/21/2020  [facility] and [place] Number:  [employer] and Address:  [place] [address] [place]      Provider Number: [id number]  Attending Physician Name and Address:  Roxan Hockey, MD  Relative Name and Phone Number:  Thamas Jaegers (daughter) Ph: [phone]    Current Level of Care: Hospital Recommended Level of Care: [place] Prior Approval Number:    Date Approved/Denied:   PASRR Number: [id number]  Discharge Plan: SNF    Current Diagnoses: Patient Active Problem List   Diagnosis Date Noted  . Bowel perforation ([place]) 06/21/2020  . Free intraperitoneal air   . Cellulitis, leg 06/17/2020  . AKI (acute kidney injury) ([place]) 06/17/2020  . Coronary artery disease, non-occlusive 03/20/2020  . Hyperlipidemia with target LDL less than 70 03/20/2020  . Acute respiratory failure with hypoxia ([place])   . Bronchiectasis with acute exacerbation ([place])   . Chronic diastolic HF (heart failure) ([place])   . Chest pain with moderate risk for cardiac etiology 01/19/2020  . Shortness of breath 01/19/2020  . Morbid obesity ([place]) 01/19/2020  . COPD with acute exacerbation ([place]) 01/19/2020  . Cardiac murmur 11/07/2019  . COPD (chronic obstructive pulmonary disease) ([place]) 11/07/2019  . Diabetes mellitus type II, non insulin dependent ([place]) 11/07/2019  . OSA and COPD overlap syndrome ([place]) 11/07/2019  . Essential hypertension 11/07/2019  . Single subsegmental pulmonary embolism without acute cor pulmonale ([place]) 11/06/2019    Orientation RESPIRATION BLADDER Height & Weight     Self, Time, Situation, Place  Normal Incontinent, Indwelling catheter Weight: 254 lb 13.6 oz (115.6 kg) Height:  5\' 9"  (175.3 cm)  BEHAVIORAL SYMPTOMS/MOOD NEUROLOGICAL BOWEL NUTRITION  STATUS      Colostomy Diet (Full liquid)  AMBULATORY STATUS COMMUNICATION OF NEEDS Skin   Limited Assist Verbally Surgical wounds, Other (Comment) (Cellulitis: bilateral legas and feet; weeping: arms and legs)                       Personal Care Assistance Level of Assistance  Bathing, Feeding, Dressing Bathing Assistance: Limited assistance Feeding assistance: Independent Dressing Assistance: Limited assistance     Functional Limitations Info  Sight, Hearing, Speech Sight Info: Impaired Hearing Info: Impaired Speech Info: Adequate    SPECIAL CARE FACTORS FREQUENCY  PT (By licensed PT)     PT Frequency: 5x's/week              Contractures Contractures Info: Not present    Additional Factors Info  Code Status, Insulin Sliding Scale, Allergies Code Status Info: Full Allergies Info: NKA   Insulin Sliding Scale Info: CBG < 70: Implement Hypoglycemia Standing Orders and refer to Hypoglycemia Standing Orders sidebar report; CBG 70-120: 0 units; CBG 121 150: 2 units; CBG 151-200: 3 units; CBG 201-250: 5 units; CBG 251-300: 8 units; CBG 301-350: 11 units; CBG 351-400: 15 units; CBG > 400 call MD and obtain STAT lab verification       Current Medications (06/27/2020):  This is the current hospital active medication list Current Facility-Administered Medications  Medication Dose Route Frequency Provider Last Rate Last Admin  . 0.9 %  sodium chloride infusion (Manually program via Guardrails IV Fluids)   Intravenous Once Virl Cagey, MD      . amiodarone (NEXTERONE PREMIX) 360-4.14 MG/200ML-% (1.8 mg/mL)  IV infusion  30 mg/hr Intravenous Continuous Rigoberto Noel, MD 16.67 mL/hr at 06/27/20 0747 30 mg/hr at 06/27/20 0747  . Chlorhexidine Gluconate Cloth 2 % PADS 6 each  6 each Topical Daily Emokpae, Courage, MD   6 each at 06/26/20 0817  . furosemide (LASIX) injection 20 mg  20 mg Intravenous BID Kathie Dike, MD   20 mg at 06/27/20 0739  . heparin ADULT infusion  100 units/mL (25000 units/276mL sodium chloride 0.45%)  1,150 Units/hr Intravenous Continuous Emokpae, Courage, MD 11.5 mL/hr at 06/27/20 0744 1,150 Units/hr at 06/27/20 0744  . insulin aspart (novoLOG) injection 0-15 Units  0-15 Units Subcutaneous Q4H Virl Cagey, MD   2 Units at 06/26/20 2033  . insulin detemir (LEVEMIR) injection 7 Units  7 Units Subcutaneous BID Kathie Dike, MD   7 Units at 06/27/20 1138  . levalbuterol (XOPENEX) nebulizer solution 0.63 mg  0.63 mg Nebulization Q6H WA Kathie Dike, MD   0.63 mg at 06/27/20 1302  . metoprolol tartrate (LOPRESSOR) tablet 25 mg  25 mg Oral BID Roxan Hockey, MD   25 mg at 06/27/20 1137  . morphine 2 MG/ML injection 2 mg  2 mg Intravenous Q2H PRN Virl Cagey, MD      . ondansetron [facility]) tablet 4 mg  4 mg Oral Q6H PRN Virl Cagey, MD       Or  . ondansetron [facility]) injection 4 mg  4 mg Intravenous Q6H PRN Virl Cagey, MD      . oxyCODONE (Oxy IR/ROXICODONE) immediate release tablet 5 mg  5 mg Oral Q4H PRN Virl Cagey, MD   5 mg at 06/26/20 2137  . pantoprazole (PROTONIX) injection 40 mg  40 mg Intravenous Q12H Emokpae, Courage, MD   40 mg at 06/27/20 1138  . piperacillin-tazobactam (ZOSYN) IVPB 3.375 g  3.375 g Intravenous Q8H Virl Cagey, MD 12.5 mL/hr at 06/27/20 0742 3.375 g at 06/27/20 1308     Discharge Medications: Please see discharge summary for a list of discharge medications.  Relevant Imaging Results:  Relevant Lab Results:   Additional Information SSN#: [id number]  Sherie Don, LCSW

## 2020-06-27 NOTE — Progress Notes (Signed)
Patient Demographics:    Vincent Cooper, is a 79 y.o. male, DOB - 27-Dec-1940, PNT:[id number]  Admit date - 06/21/2020   Admitting Physician Albertine Patricia, MD  Outpatient Primary MD for the patient is [name], DO  LOS - 6   Chief Complaint  Patient presents with  . Abdominal Pain        Subjective:    [name] grand-daughter at  bedside,   --has fecal material in [employer] clear liquid diet well - Says he is hungry  No fever  Or chills  No nausea and vomiting    Assessment  & Plan :    Principal Problem:   Bowel perforation (HCC) Active Problems:   COPD (chronic obstructive pulmonary disease) (HCC)   Diabetes mellitus type II, non insulin dependent (HCC)   OSA and COPD overlap syndrome (HCC)   Essential hypertension   Morbid obesity (HCC)   Chronic diastolic HF (heart failure) (HCC)   Coronary artery disease, non-occlusive   Hyperlipidemia with target LDL less than 70   Free intraperitoneal air   Brief Summary:- 79 year old obese man with dm2, htn, cad, COPD, H/o PE on Apixaban admitted on 06/21/2020 with abdominal pain and found to have colonic perforation due to fishbone impaction and underwent partial colectomy, end colostomy emergently on 06/21/2020, postop patient had persistent hypotension most likely due to severe sepsis with septic shock -Hemodynamics improved patient weaned off pressors on 06/23/2020, off vent and extubated on  06/23/2020  A/p 1)Severe sepsis with septic shock--surgical pathology suggest diverticulitis with bowel perforation and abscess resulting in fecal peritonitis peritonitis -and severe sepsis with septic shock --Sepsis pathophysiology appears to have resolved as of 06/23/2020 -Hypotension resolved, discontinued Neo-Synephrine, discontinue vasopressin and discontinued Levophed on 06/23/2020 --- -lactic acid is 2.1 >>2.3>>1.8 -Leukocytosis has  resolved, okay to complete IV Zosyn, started 06/21/2020, last dose 06/28/2019  2)New onset A. fib with RVR --tachyarrhythmia in the setting of severe sepsis and septic shock---  --- Started on oral metoprolol 25 mg twice daily on 06/27/2020 plan is to discontinue IV amiodarone on 06/27/2020  - last known EF was 60 to 65% based on echo from December 2020.   --Started IV heparin on 06/26/2020 without bolus -Plan to transition to Eliquis 2.5 mg twice daily in the next day or 2 if H&H remains stable  3) history of PE--- diagnosed in December 2020-- PTA was on Eliquis  -Received Kcentra preop -Anticoagulation as above #2  4)DM2--Continue Levemir 7 units twice daily, we need to titrate insulin requirements up when diet improve along with sliding scale coverage  5)Obesity/OSA--- history of poor compliance with CPAP PTA,   6) acute symptomatic anemia due to acute blood loss--- baseline hemoglobin of 10 prior to surgery.  Since surgery, he has required transfusion of 2 unit of PRBC which is thought related to blood loss.  Hemoglobin remains low today.  He will be transfused another unit of PRBC today.   - received FFP and Kcentra previously -Hemoglobin stable above 9  7)AKI----acute kidney injury due to reduced renal perfusion in the setting of persistent hypotension in the setting of severe sepsis with septic shock compounded by lisinopril, torsemide and Metformin use PTA -   creatinine on admission= 1.0 ,  baseline creatinine = 1.0   , creatinine peaked at 2.04.  Currently 1.76  renally adjust medications, avoid nephrotoxic agents / dehydration  / hypotension -Lisinopril is currently on hold -Restarted on IV Lasix due to severe anasarca, continue to follow renal function  8) anasarca.  Patient received IV albumin infusions for hypoalbuminemia.  IV Lasix ordered previously  9) hypokalemia.  Replace.  Magnesium 2.3.  10) generalized weakness and deconditioning----PT eval appreciated recommends SNF  rehab  Disposition/Need for in-Hospital Stay- patient unable to be discharged at this time due to ---awaiting transfer to SNF rehab   status is: Inpatient  Remains inpatient appropriate because:Awaiting transfer to SNF rehab   Disposition: The patient is from: Home              Anticipated d/c is to: SNF              Anticipated d/c date is: 1 day              Patient currently is medically stable to d/c. Barriers:-awaiting transfer to SNF rehab   Procedures:- 8/4 partial colectomy, end colostomy, laparotomy 8/4 RIJ >> 8/4 ETT >> -Extubated 06/23/2020  Code Status : FULL   Family Communication:  Discussed with daughter, wife and granddaughter at the bedside Consults  :  Gen surg/PCCM  DVT Prophylaxis  :   - SCDs /Heparin    Lab Results  Component Value Date   PLT 172 06/27/2020    Inpatient Medications  Scheduled Meds: . sodium chloride   Intravenous Once  . Chlorhexidine Gluconate Cloth  6 each Topical Daily  . furosemide  20 mg Intravenous BID  . insulin aspart  0-15 Units Subcutaneous Q4H  . insulin detemir  7 Units Subcutaneous BID  . levalbuterol  0.63 mg Nebulization Q6H WA  . metoprolol tartrate  25 mg Oral BID  . pantoprazole (PROTONIX) IV  40 mg Intravenous Q12H   Continuous Infusions: . amiodarone 30 mg/hr (06/27/20 0747)  . heparin 1,150 Units/hr (06/27/20 0744)  . piperacillin-tazobactam (ZOSYN)  IV 3.375 g (06/27/20 0742)   PRN Meds:.morphine injection, ondansetron **OR** ondansetron (ZOFRAN) IV, oxyCODONE    Anti-infectives (From admission, onward)   Start     Dose/Rate Route Frequency Ordered Stop   06/22/20 0030  piperacillin-tazobactam (ZOSYN) IVPB 3.375 g     Discontinue     3.375 g 12.5 mL/hr over 240 Minutes Intravenous Every 8 hours 06/21/20 1953     06/21/20 1830  cefoTEtan (CEFOTAN) 2 g in sodium chloride 0.9 % 100 mL IVPB        2 g 200 mL/hr over 30 Minutes Intravenous On call to O.R. 06/21/20 1819 06/21/20 2002   06/21/20 1830   fluconazole (DIFLUCAN) IVPB 400 mg  Status:  Discontinued        400 mg 100 mL/hr over 120 Minutes Intravenous Every 24 hours 06/21/20 1828 06/21/20 2340   06/21/20 1630  piperacillin-tazobactam (ZOSYN) IVPB 3.375 g        3.375 g 100 mL/hr over 30 Minutes Intravenous  Once 06/21/20 1615 06/21/20 1751        Objective:   Vitals:   06/27/20 0645 06/27/20 0710 06/27/20 0921 06/27/20 1107  BP:      Pulse: 71 79  95  Resp: 15 18  (!) 22  Temp:  97.8 F (36.6 C)  97.9 F (36.6 C)  TempSrc:  Oral  Axillary  SpO2: 92% 96% 95% 98%  Weight:      Height:  Wt Readings from Last 3 Encounters:  06/27/20 115.6 kg  06/20/20 115 kg  03/20/20 117.9 kg     Intake/Output Summary (Last 24 hours) at 06/27/2020 1224 Last data filed at 06/27/2020 0500 Gross per 24 hour  Intake 481.25 ml  Output 4150 ml  Net -3668.75 ml   Physical Exam  General exam: Alert, awake, oriented x 3 Respiratory system: Crackles at bases. Respiratory effort normal. Cardiovascular system:RRR. No murmurs, rubs, gallops. Gastrointestinal system: Abdomen is nondistended, soft and nontender.  . Normal bowel sounds heard.  Ostomy left lower quadrant Central nervous system: Alert and oriented. No focal neurological deficits. Extremities: 2-3+ edema bilaterally Skin: No rashes, lesions or ulcers Psychiatry: Judgement and insight appear normal. Mood & affect appropriate.      dMicro Results Recent Results (from the past 240 hour(s))  SARS Coronavirus 2 by RT PCR (hospital order, performed in [facility] hospital lab) Nasopharyngeal Nasopharyngeal Swab     Status: None   Collection Time: 06/17/20  1:15 PM   Specimen: Nasopharyngeal Swab  Result Value Ref Range Status   SARS Coronavirus 2 NEGATIVE NEGATIVE Final    Comment: (NOTE) SARS-CoV-2 target nucleic acids are NOT DETECTED.  The SARS-CoV-2 RNA is generally detectable in upper and lower respiratory specimens during the acute phase of infection. The  lowest concentration of SARS-CoV-2 viral copies this assay can detect is 250 copies / mL. A negative result does not preclude SARS-CoV-2 infection and should not be used as the sole basis for treatment or other patient management decisions.  A negative result may occur with improper specimen collection / handling, submission of specimen other than nasopharyngeal swab, presence of viral mutation(s) within the areas targeted by this assay, and inadequate number of viral copies (<250 copies / mL). A negative result must be combined with clinical observations, patient history, and epidemiological information.  Fact Sheet for Patients:   StrictlyIdeas.no  Fact Sheet for Healthcare Providers: BankingDealers.co.za  This test is not yet approved or  cleared by the [place] FDA and has been authorized for detection and/or diagnosis of SARS-CoV-2 by FDA under an Emergency Use Authorization (EUA).  This EUA will remain in effect (meaning this test can be used) for the duration of the COVID-19 declaration under Section 564(b)(1) of the Act, 21 U.S.C. section 360bbb-3(b)(1), unless the authorization is terminated or revoked sooner.  Performed at [facility], [address] [place], [place] [address]   SARS Coronavirus 2 by RT PCR (hospital order, performed in [facility] hospital lab) Nasopharyngeal Nasopharyngeal Swab     Status: None   Collection Time: 06/21/20  1:47 PM   Specimen: Nasopharyngeal Swab  Result Value Ref Range Status   SARS Coronavirus 2 NEGATIVE NEGATIVE Final    Comment: (NOTE) SARS-CoV-2 target nucleic acids are NOT DETECTED.  The SARS-CoV-2 RNA is generally detectable in upper and lower respiratory specimens during the acute phase of infection. The lowest concentration of SARS-CoV-2 viral copies this assay can detect is 250 copies / mL. A negative result does not preclude SARS-CoV-2 infection and should not be used as  the sole basis for treatment or other patient management decisions.  A negative result may occur with improper specimen collection / handling, submission of specimen other than nasopharyngeal swab, presence of viral mutation(s) within the areas targeted by this assay, and inadequate number of viral copies (<250 copies / mL). A negative result must be combined with clinical observations, patient history, and epidemiological information.  Fact Sheet for Patients:  StrictlyIdeas.no  Fact Sheet for Healthcare Providers: BankingDealers.co.za  This test is not yet approved or  cleared by the [place] FDA and has been authorized for detection and/or diagnosis of SARS-CoV-2 by FDA under an Emergency Use Authorization (EUA).  This EUA will remain in effect (meaning this test can be used) for the duration of the COVID-19 declaration under Section 564(b)(1) of the Act, 21 U.S.C. section 360bbb-3(b)(1), unless the authorization is terminated or revoked sooner.  Performed at [facility], [address], [place], [place] [address]   MRSA PCR Screening     Status: None   Collection Time: 06/21/20 11:31 PM   Specimen: Nasal Mucosa; Nasopharyngeal  Result Value Ref Range Status   MRSA by PCR NEGATIVE NEGATIVE Final    Comment:        The GeneXpert MRSA Assay (FDA approved for NASAL specimens only), is one component of a comprehensive MRSA colonization surveillance program. It is not intended to diagnose MRSA infection nor to guide or monitor treatment for MRSA infections. Performed at [facility] [address], [place], [place] [address]     Radiology Reports CT ABDOMEN PELVIS W CONTRAST  Result Date: 06/21/2020 CLINICAL DATA:  Acute left lower quadrant abdominal pain. EXAM: CT ABDOMEN AND PELVIS WITH CONTRAST TECHNIQUE: Multidetector CT imaging of the abdomen and pelvis was performed using the standard protocol following bolus  administration of intravenous contrast. CONTRAST:  151mL OMNIPAQUE IOHEXOL 300 MG/ML  SOLN COMPARISON:  None. FINDINGS: Lower chest: No acute abnormality. Hepatobiliary: Solitary gallstone is noted. No biliary dilatation is noted. The liver is unremarkable. Pancreas: Unremarkable. No pancreatic ductal dilatation or surrounding inflammatory changes. Spleen: Normal in size without focal abnormality. Adrenals/Urinary Tract: Adrenal glands are unremarkable. Kidneys are normal, without renal calculi, focal lesion, or hydronephrosis. Bladder is unremarkable. Stomach/Bowel: Moderate amount of free air is noted in the peritoneal space, particularly in the epigastric region. This is concerning for rupture of hollow viscus. However the source is not clearly identified. The stomach and appendix are unremarkable. There is no evidence of bowel obstruction or inflammation. Sigmoid diverticulosis is noted. Vascular/Lymphatic: Aortic atherosclerosis. No enlarged abdominal or pelvic lymph nodes. Reproductive: Prostate is unremarkable. Other: No abdominal wall hernia or abnormality. No abdominopelvic ascites. Musculoskeletal: No acute or significant osseous findings. IMPRESSION: 1. Moderate amount of free air is noted in the peritoneal space, particularly in the epigastric region. This is concerning for rupture of hollow viscus. However the source is not clearly identified. Critical Value/emergent results were called by telephone at the time of interpretation on 06/21/2020 at 3:45 pm to provider ABIGAIL HARRIS , who verbally acknowledged these results. 2. Sigmoid diverticulosis without inflammation. 3. Solitary gallstone. Aortic Atherosclerosis (ICD10-I70.0). Electronically Signed   By: Marijo Conception M.D.   On: 06/21/2020 15:47   US Venous Img Lower Bilateral  Result Date: 06/17/2020 CLINICAL DATA:  Edema EXAM: BILATERAL LOWER EXTREMITY VENOUS DOPPLER ULTRASOUND TECHNIQUE: Gray-scale sonography with compression, as well as color  and duplex ultrasound, were performed to evaluate the deep venous system(s) from the level of the common femoral vein through the popliteal and proximal calf veins. COMPARISON:  None. FINDINGS: VENOUS Normal compressibility of the common femoral, superficial femoral, and popliteal veins, as well as the visualized calf veins. Visualized portions of profunda femoral vein and great saphenous vein unremarkable. No filling defects to suggest DVT on grayscale or color Doppler imaging. Doppler waveforms show normal direction of venous flow, normal respiratory plasticity and response to augmentation. Limited views of the  contralateral common femoral vein are unremarkable. OTHER Ill-defined fluid/edema within the subcutaneous soft tissues of both calves. Limitations: none IMPRESSION: 1. No DVT seen, bilateral lower extremities. 2. Fluid/edema within the subcutaneous soft tissues of both calves. Electronically Signed   By: Franki Cabot M.D.   On: 06/17/2020 14:10   DG Chest Port 1 View  Result Date: 06/23/2020 CLINICAL DATA:  Acute respiratory failure. EXAM: PORTABLE CHEST 1 VIEW COMPARISON:  06/22/2020.  CT 06/21/2020. FINDINGS: Endotracheal tube, NG tube, right IJ line stable position. Stable cardiomegaly. Low lung volumes with bibasilar atelectasis. No pleural effusion or pneumothorax. Free air in the right hemidiaphragm best identified by prior CT. IMPRESSION: 1.  Endotracheal tube, NG tube, right IJ line stable position. 2.  Stable cardiomegaly. 3. Low lung volumes with bibasilar atelectasis again noted. Chest is stable from prior exam. 4. Free air under the right hemidiaphragm best identified by prior CT. Electronically Signed   By: Marcello Moores  Register   On: 06/23/2020 07:00   DG Chest Port 1 View  Result Date: 06/22/2020 CLINICAL DATA:  Post intubation EXAM: PORTABLE CHEST 1 VIEW COMPARISON:  06/21/2020, CT 03/07/2020 FINDINGS: Interval intubation, tip of the endotracheal tube is about 14 mm superior to the carina.  Right-sided central venous catheter tip over the cavoatrial region. Esophageal tube tip below the diaphragm but incompletely visualized. Cardiomegaly with aortic atherosclerosis. No pleural effusion or consolidation. Minimal lucency at the right diaphragm could reflect small free air demonstrated on CT. IMPRESSION: 1. Interval intubation with tip of the endotracheal tube about 14 mm superior to the carina. Right IJ central venous catheter tip over the cavoatrial region. No pneumothorax. 2. Cardiomegaly. No edema or infiltrate. 3. Possible small amount of free air beneath the right diaphragm, demonstrated on previous CT. Electronically Signed   By: Donavan Foil M.D.   On: 06/22/2020 00:14   DG Chest Port 1 View  Result Date: 06/21/2020 CLINICAL DATA:  Shortness of breath.  History of COPD. EXAM: PORTABLE CHEST 1 VIEW COMPARISON:  Single-view of the chest 06/17/2020 and 11/13/2019. CT chest 11/06/2019. FINDINGS: The lungs are emphysematous. Mild subsegmental atelectasis is seen in the lung bases. The lungs are otherwise clear. Heart size is normal. Aortic atherosclerosis. No pneumothorax or pleural fluid. No acute bony abnormality. IMPRESSION: No acute disease. Aortic Atherosclerosis (ICD10-I70.0) and Emphysema (ICD10-J43.9). Electronically Signed   By: Inge Rise M.D.   On: 06/21/2020 13:58   DG Chest Port 1 View  Result Date: 06/17/2020 CLINICAL DATA:  Bilateral foot swelling EXAM: PORTABLE CHEST 1 VIEW COMPARISON:  03/17/2020 FINDINGS: Stable mild cardiomegaly. Atherosclerotic calcification of the aortic knob. Mildly hyperexpanded lungs coarsened bibasilar interstitial markings. No focal airspace consolidation. No overt edema. No significant pleural fluid collection. No pneumothorax. IMPRESSION: Stable mild cardiomegaly. No overt edema or focal airspace consolidation. Electronically Signed   By: Davina Poke D.O.   On: 06/17/2020 12:30   [place] EKG SITE RITE  Result Date: 06/26/2020 If Site Rite  image not attached, placement could not be confirmed due to current cardiac rhythm.    CBC Recent Labs  Lab 06/22/20 0510 06/22/20 1544 06/23/20 0459 06/24/20 0514 06/25/20 0635 06/26/20 0840 06/27/20 0504  WBC 9.2  --  15.8* 12.9* 8.6 8.4 8.9  HGB 7.3*   < > 8.3* 7.0* 7.4* 8.6* 9.0*  HCT 23.0*   < > 26.2* 22.6* 23.5* 27.8* 28.4*  PLT PLT CLUMPING NOTED, COLLECT IN CITRATE FOR CBC  --  317 207 169 156 172  MCV 105.0*  --  98.9 101.3* 100.0 97.5 96.3  MCH 33.3  --  31.3 31.4 31.5 30.2 30.5  MCHC 31.7  --  31.7 31.0 31.5 30.9 31.7  RDW 18.3*  --  18.6* 17.9* 17.3* 17.4* 16.8*  LYMPHSABS 1.6  --   --   --   --   --   --   MONOABS 1.1*  --   --   --   --   --   --   EOSABS 0.0  --   --   --   --   --   --   BASOSABS 0.0  --   --   --   --   --   --    < > = values in this interval not displayed.    Chemistries  Recent Labs  Lab 06/21/20 1240 06/21/20 1240 06/22/20 0510 06/22/20 1544 06/23/20 0459 06/24/20 0514 06/25/20 0635 06/26/20 0840 06/27/20 0504  NA 137   < > 137   < > 135 142 145 144 143  K 3.8   < > 4.2   < > 4.2 3.2* 3.1* 3.0* 3.6  CL 99   < > 102   < > 102 109 109 104 104  CO2 25   < > 24   < > 21* 25 24 27 28   GLUCOSE 202*   < > 221*   < > 281* 72 96 96 82  BUN 37*   < > 31*   < > 37* 37* 32* 27* 24*  CREATININE 1.19   < > 1.40*   < > 2.01* 2.04* 1.94* 1.88* 1.76*  CALCIUM 8.4*   < > 7.4*   < > 7.0* 7.5* 8.0* 8.3* 8.2*  MG  --   --  1.6*  --  2.2  --  2.3  --   --   AST 18  --  18  --   --  25  --   --   --   ALT 24  --  21  --   --  24  --   --   --   ALKPHOS 48  --  27*  --   --  37*  --   --   --   BILITOT 1.0  --  0.8  --   --  0.6  --   --   --    < > = values in this interval not displayed.   ------------------------------------------------------------------------------------------------------------------ No results for input(s): CHOL, HDL, LDLCALC, TRIG, CHOLHDL, LDLDIRECT in the last 72 hours.  Lab Results  Component Value Date   HGBA1C 5.4  06/17/2020   ------------------------------------------------------------------------------------------------------------------ No results for input(s): TSH, T4TOTAL, T3FREE, THYROIDAB in the last 72 hours.  Invalid input(s): FREET3 ------------------------------------------------------------------------------------------------------------------ No results for input(s): VITAMINB12, FOLATE, FERRITIN, TIBC, IRON, RETICCTPCT in the last 72 hours.  Coagulation profile Recent Labs  Lab 06/24/20 0916  INR 1.4*    No results for input(s): DDIMER in the last 72 hours.  Cardiac Enzymes No results for input(s): CKMB, TROPONINI, MYOGLOBIN in the last 168 hours.  Invalid input(s): CK ------------------------------------------------------------------------------------------------------------------    Component Value Date/Time   BNP 87.0 06/17/2020 1134    Mairead Schwarzkopf M.D on 06/27/2020 at 12:24 PM  Go to [email or web address] - for contact info  [facility] - Office  [phone]

## 2020-06-27 NOTE — Progress Notes (Signed)
Spoke with Dr Denton Brick, he is wanting to not place PICC and wanting peripheral access. He plans to wean off amiodarone drip. Inquired about midline catheter due to poor lower arm vasculature. MD gave verbal order for Midline. Patient has 1 peripheral IV left forearm. Plan is to d/c CVC after alternate access is established.

## 2020-06-28 LAB — BASIC METABOLIC PANEL
Anion gap: 11 (ref 5–15)
BUN: 21 mg/dL (ref 8–23)
CO2: 29 mmol/L (ref 22–32)
Calcium: 8 mg/dL — ABNORMAL LOW (ref 8.9–10.3)
Chloride: 100 mmol/L (ref 98–111)
Creatinine, Ser: 1.75 mg/dL — ABNORMAL HIGH (ref 0.61–1.24)
GFR calc Af Amer: 42 mL/min — ABNORMAL LOW (ref >60)
GFR calc non Af Amer: 36 mL/min — ABNORMAL LOW (ref >60)
Glucose, Bld: 81 mg/dL (ref 70–99)
Potassium: 2.9 mmol/L — ABNORMAL LOW (ref 3.5–5.1)
Sodium: 140 mmol/L (ref 135–145)

## 2020-06-28 LAB — GLUCOSE, CAPILLARY
Glucose-Capillary: 106 mg/dL — ABNORMAL HIGH (ref 70–99)
Glucose-Capillary: 141 mg/dL — ABNORMAL HIGH (ref 70–99)
Glucose-Capillary: 161 mg/dL — ABNORMAL HIGH (ref 70–99)
Glucose-Capillary: 80 mg/dL (ref 70–99)
Glucose-Capillary: 87 mg/dL (ref 70–99)
Glucose-Capillary: 95 mg/dL (ref 70–99)

## 2020-06-28 LAB — CBC
HCT: 27.6 % — ABNORMAL LOW (ref 39.0–52.0)
Hemoglobin: 8.7 g/dL — ABNORMAL LOW (ref 13.0–17.0)
MCH: 30.2 pg (ref 26.0–34.0)
MCHC: 31.5 g/dL (ref 30.0–36.0)
MCV: 95.8 fL (ref 80.0–100.0)
Platelets: 172 10*3/uL (ref 150–400)
RBC: 2.88 MIL/uL — ABNORMAL LOW (ref 4.22–5.81)
RDW: 16.3 % — ABNORMAL HIGH (ref 11.5–15.5)
WBC: 9 10*3/uL (ref 4.0–10.5)
nRBC: 0 % (ref 0.0–0.2)

## 2020-06-28 LAB — HEPARIN LEVEL (UNFRACTIONATED): Heparin Unfractionated: 0.31 IU/mL (ref 0.30–0.70)

## 2020-06-28 LAB — MAGNESIUM: Magnesium: 1.6 mg/dL — ABNORMAL LOW (ref 1.7–2.4)

## 2020-06-28 MED ORDER — TAMSULOSIN HCL 0.4 MG PO CAPS
0.4000 mg | ORAL_CAPSULE | Freq: Every day | ORAL | Status: DC
  Administered 2020-06-28 – 2020-06-29 (×2): 0.4 mg via ORAL
  Filled 2020-06-28 (×2): qty 1

## 2020-06-28 MED ORDER — POTASSIUM CHLORIDE 10 MEQ/100ML IV SOLN
10.0000 meq | INTRAVENOUS | Status: AC
  Administered 2020-06-28 (×4): 10 meq via INTRAVENOUS
  Filled 2020-06-28 (×4): qty 100

## 2020-06-28 MED ORDER — DOCUSATE SODIUM 100 MG PO CAPS
100.0000 mg | ORAL_CAPSULE | Freq: Two times a day (BID) | ORAL | Status: DC
  Administered 2020-06-28 – 2020-06-29 (×3): 100 mg via ORAL
  Filled 2020-06-28 (×3): qty 1

## 2020-06-28 MED ORDER — POTASSIUM CHLORIDE CRYS ER 20 MEQ PO TBCR
40.0000 meq | EXTENDED_RELEASE_TABLET | ORAL | Status: AC
  Administered 2020-06-28 (×2): 40 meq via ORAL
  Filled 2020-06-28 (×2): qty 2

## 2020-06-28 NOTE — Consult Note (Signed)
[facility] Nurse ostomy follow up Stoma type/location: LMQ colostomy Stomal assessment/size: 1 3/4" with separation noted circumferential.  Has been on steroids prior to this admission, recently.  Peristomal assessment: intact Treatment options for stomal/peristomal skin: implement barrier ring and 1 piece convex pouch Output soft brown stool Ostomy pouching: 1pc.convex with barrier ring Education provided:  Daughter at bedside.  Is planning to discharge tomorrow possibly to SNF.  She will be helping once home, lives next door. Pouch is removed and cleansed.  Explained separation and Dr. Constance Haw is notified of findings today via secure chat. Explained measuring rationale for barrier ring and convex pouch.  New pouch cut and applied. Demonstrated open and close and to empty when 1/3 full.  Anticipate twice weekly pouch changes. Supplies for discharge in the room.  State we will see him again Friday if still here.  Enrolled patient in [place] Discharge program: Yes today.  Will follow.  Domenic Moras MSN, RN, FNP-BC [facility] Wound, Ostomy, Continence Nurse Pager [phone]

## 2020-06-28 NOTE — Progress Notes (Signed)
Patient Demographics:    Vincent Cooper, is a 79 y.o. male, DOB - [birth year], CBS:[id number]  Admit date - 06/21/2020   Admitting Physician Albertine Patricia, MD  Outpatient Primary MD for the patient is [name], DO  LOS - 7   Chief Complaint  Patient presents with  . Abdominal Pain        Subjective:    Vincent Cooper   --has fecal material in Ostomy Bag Tolerating clear liquid diet well - No fever  Or chills  No nausea and vomiting    Assessment  & Plan :    Principal Problem:   Bowel perforation (HCC) Active Problems:   COPD (chronic obstructive pulmonary disease) (HCC)   Diabetes mellitus type II, non insulin dependent (HCC)   OSA and COPD overlap syndrome (HCC)   Essential hypertension   Morbid obesity (HCC)   Chronic diastolic HF (heart failure) (HCC)   Coronary artery disease, non-occlusive   Hyperlipidemia with target LDL less than 70   Free intraperitoneal air   Brief Summary:- 79 year old obese man with dm2, htn, cad, COPD, H/o PE on Apixaban admitted on 06/21/2020 with abdominal pain and found to have colonic perforation due to fishbone impaction and underwent partial colectomy, end colostomy emergently on 06/21/2020, postop patient had persistent hypotension most likely due to severe sepsis with septic shock -Hemodynamics improved patient weaned off pressors on 06/23/2020, off vent and extubated on  06/23/2020  A/p 1)Severe sepsis with septic shock--surgical pathology suggest diverticulitis with bowel perforation and abscess resulting in fecal peritonitis peritonitis -and severe sepsis with septic shock --Sepsis pathophysiology appears to have resolved as of 06/23/2020 -Hypotension resolved, discontinued Neo-Synephrine, discontinue vasopressin and discontinued Levophed on 06/23/2020 --- -lactic acid is 2.1 >>2.3>>1.8 -Leukocytosis has resolved, okay to complete IV Zosyn, started  06/21/2020, last dose 06/28/2019  2)New onset A. fib with RVR --tachyarrhythmia in the setting of severe sepsis and septic shock---  --- Started on oral metoprolol 25 mg twice daily on 06/27/2020 plan is to discontinue IV amiodarone on 06/27/2020  - last known EF was 60 to 65% based on echo from December 2020.   --Started IV heparin on 06/26/2020 without bolus -Plan to transition to Eliquis 2.5 mg twice daily in the next day or 2 if H&H remains stable  3)History of PE--- diagnosed in December 2020-- PTA was on Eliquis  -Received Kcentra preop -Anticoagulation as above #2  4)DM2--Continue Levemir 7 units twice daily, we need to titrate insulin requirements up when diet improve along with sliding scale coverage  5)Obesity/OSA--- history of poor compliance with CPAP PTA,   6) acute symptomatic anemia due to acute blood loss--- baseline hemoglobin of 10 prior to surgery.  Since surgery, he has required transfusion of 2 unit of PRBC which is thought related to blood loss.  Hemoglobin remains low today.  He will be transfused another unit of PRBC today.   - received FFP and Kcentra previously -Hemoglobin stable above 9  7)AKI----acute kidney injury due to reduced renal perfusion in the setting of persistent hypotension in the setting of severe sepsis with septic shock compounded by lisinopril, torsemide and Metformin use PTA -   creatinine on admission= 1.0 , baseline creatinine = 1.0   , creatinine peaked at  2.04.  Currently 1.76  renally adjust medications, avoid nephrotoxic agents / dehydration  / hypotension -Lisinopril is currently on hold -Restarted on IV Lasix due to severe anasarca, continue to follow renal function  8)Anasarca.  Patient received IV albumin infusions for hypoalbuminemia.  IV Lasix ordered previously  9) hypokalemia---.  Replace.  Magnesium 2.3,   10)Generalized weakness and deconditioning----PT eval appreciated recommends SNF rehab  Disposition/Need for in-Hospital Stay-  patient unable to be discharged at this time due to ---awaiting transfer to SNF rehab   status is: Inpatient  Remains inpatient appropriate because:Awaiting transfer to SNF rehab   Disposition: The patient is from: Home              Anticipated d/c is to: SNF              Anticipated d/c date is: 1 day              Patient currently is medically stable to d/c. Barriers:-awaiting transfer to SNF rehab   Procedures:- 8/4 partial colectomy, end colostomy, laparotomy 8/4 RIJ >> 8/4 ETT >> -Extubated 06/23/2020  Code Status : FULL   Family Communication:  Discussed with daughter, wife and granddaughter at the bedside Consults  :  Gen surg/PCCM  DVT Prophylaxis  :   - SCDs /Heparin    Lab Results  Component Value Date   PLT 172 06/28/2020    Inpatient Medications  Scheduled Meds: . sodium chloride   Intravenous Once  . Chlorhexidine Gluconate Cloth  6 each Topical Daily  . docusate sodium  100 mg Oral BID  . furosemide  20 mg Intravenous BID  . insulin aspart  0-15 Units Subcutaneous Q4H  . insulin detemir  7 Units Subcutaneous BID  . levalbuterol  0.63 mg Nebulization Q6H WA  . metoprolol tartrate  25 mg Oral BID  . pantoprazole (PROTONIX) IV  40 mg Intravenous Q12H  . tamsulosin  0.4 mg Oral Daily   Continuous Infusions: . amiodarone Stopped (06/28/20 1856)  . heparin 1,150 Units/hr (06/28/20 0731)   PRN Meds:.morphine injection, ondansetron **OR** ondansetron (ZOFRAN) IV, oxyCODONE    Anti-infectives (From admission, onward)   Start     Dose/Rate Route Frequency Ordered Stop   06/22/20 0030  piperacillin-tazobactam (ZOSYN) IVPB 3.375 g  Status:  Discontinued        3.375 g 12.5 mL/hr over 240 Minutes Intravenous Every 8 hours 06/21/20 1953 06/28/20 0807   06/21/20 1830  cefoTEtan (CEFOTAN) 2 g in sodium chloride 0.9 % 100 mL IVPB        2 g 200 mL/hr over 30 Minutes Intravenous On call to O.R. 06/21/20 1819 06/21/20 2002   06/21/20 1830  fluconazole (DIFLUCAN)  IVPB 400 mg  Status:  Discontinued        400 mg 100 mL/hr over 120 Minutes Intravenous Every 24 hours 06/21/20 1828 06/21/20 2340   06/21/20 1630  piperacillin-tazobactam (ZOSYN) IVPB 3.375 g        3.375 g 100 mL/hr over 30 Minutes Intravenous  Once 06/21/20 1615 06/21/20 1751        Objective:   Vitals:   06/28/20 1700 06/28/20 1730 06/28/20 1800 06/28/20 1830  BP:  (!) 88/39 (!) 100/40 (!) 104/36  Pulse:  89 85 94  Resp:  16 14 17   Temp: 98.2 F (36.8 C)     TempSrc: Oral     SpO2:  92% 93% 92%  Weight:      Height:  Wt Readings from Last 3 Encounters:  06/27/20 115.6 kg  06/20/20 115 kg  03/20/20 117.9 kg     Intake/Output Summary (Last 24 hours) at 06/28/2020 1921 Last data filed at 06/28/2020 1900 Gross per 24 hour  Intake 606.51 ml  Output 3350 ml  Net -2743.49 ml   Physical Exam  General exam: Alert, awake, oriented x 3 Respiratory system: Crackles at bases. Respiratory effort normal. Cardiovascular system:RRR. No murmurs, rubs, gallops. Gastrointestinal system: Abdomen is nondistended, soft and nontender.  . Normal bowel sounds heard.  Ostomy left lower quadrant Central nervous system: Alert and oriented. No focal neurological deficits. Extremities: 2-3+ edema bilaterally Skin: No rashes, lesions or ulcers Psychiatry: Judgement and insight appear normal. Mood & affect appropriate.      dMicro Results Recent Results (from the past 240 hour(s))  SARS Coronavirus 2 by RT PCR (hospital order, performed in [facility] hospital lab) Nasopharyngeal Nasopharyngeal Swab     Status: None   Collection Time: 06/21/20  1:47 PM   Specimen: Nasopharyngeal Swab  Result Value Ref Range Status   SARS Coronavirus 2 NEGATIVE NEGATIVE Final    Comment: (NOTE) SARS-CoV-2 target nucleic acids are NOT DETECTED.  The SARS-CoV-2 RNA is generally detectable in upper and lower respiratory specimens during the acute phase of infection. The lowest concentration of  SARS-CoV-2 viral copies this assay can detect is 250 copies / mL. A negative result does not preclude SARS-CoV-2 infection and should not be used as the sole basis for treatment or other patient management decisions.  A negative result may occur with improper specimen collection / handling, submission of specimen other than nasopharyngeal swab, presence of viral mutation(s) within the areas targeted by this assay, and inadequate number of viral copies (<250 copies / mL). A negative result must be combined with clinical observations, patient history, and epidemiological information.  Fact Sheet for Patients:   StrictlyIdeas.no  Fact Sheet for Healthcare Providers: BankingDealers.co.za  This test is not yet approved or  cleared by the [place] FDA and has been authorized for detection and/or diagnosis of SARS-CoV-2 by FDA under an Emergency Use Authorization (EUA).  This EUA will remain in effect (meaning this test can be used) for the duration of the COVID-19 declaration under Section 564(b)(1) of the Act, 21 U.S.C. section 360bbb-3(b)(1), unless the authorization is terminated or revoked sooner.  Performed at [facility], [address], [place], [place] [address]   MRSA PCR Screening     Status: None   Collection Time: 06/21/20 11:31 PM   Specimen: Nasal Mucosa; Nasopharyngeal  Result Value Ref Range Status   MRSA by PCR NEGATIVE NEGATIVE Final    Comment:        The GeneXpert MRSA Assay (FDA approved for NASAL specimens only), is one component of a comprehensive MRSA colonization surveillance program. It is not intended to diagnose MRSA infection nor to guide or monitor treatment for MRSA infections. Performed at [facility], [address], [place], [place] [address]     Radiology Reports CT ABDOMEN PELVIS W CONTRAST  Result Date: 06/21/2020 CLINICAL DATA:  Acute left lower quadrant abdominal pain. EXAM: CT ABDOMEN  AND PELVIS WITH CONTRAST TECHNIQUE: Multidetector CT imaging of the abdomen and pelvis was performed using the standard protocol following bolus administration of intravenous contrast. CONTRAST:  146mL OMNIPAQUE IOHEXOL 300 MG/ML  SOLN COMPARISON:  None. FINDINGS: Lower chest: No acute abnormality. Hepatobiliary: Solitary gallstone is noted. No biliary dilatation is noted. The liver is unremarkable. Pancreas: Unremarkable.  No pancreatic ductal dilatation or surrounding inflammatory changes. Spleen: Normal in size without focal abnormality. Adrenals/Urinary Tract: Adrenal glands are unremarkable. Kidneys are normal, without renal calculi, focal lesion, or hydronephrosis. Bladder is unremarkable. Stomach/Bowel: Moderate amount of free air is noted in the peritoneal space, particularly in the epigastric region. This is concerning for rupture of hollow viscus. However the source is not clearly identified. The stomach and appendix are unremarkable. There is no evidence of bowel obstruction or inflammation. Sigmoid diverticulosis is noted. Vascular/Lymphatic: Aortic atherosclerosis. No enlarged abdominal or pelvic lymph nodes. Reproductive: Prostate is unremarkable. Other: No abdominal wall hernia or abnormality. No abdominopelvic ascites. Musculoskeletal: No acute or significant osseous findings. IMPRESSION: 1. Moderate amount of free air is noted in the peritoneal space, particularly in the epigastric region. This is concerning for rupture of hollow viscus. However the source is not clearly identified. Critical Value/emergent results were called by telephone at the time of interpretation on 06/21/2020 at 3:45 pm to provider ABIGAIL HARRIS , who verbally acknowledged these results. 2. Sigmoid diverticulosis without inflammation. 3. Solitary gallstone. Aortic Atherosclerosis (ICD10-I70.0). Electronically Signed   By: Marijo Conception M.D.   On: 06/21/2020 15:47   US Venous Img Lower Bilateral  Result Date:  06/17/2020 CLINICAL DATA:  Edema EXAM: BILATERAL LOWER EXTREMITY VENOUS DOPPLER ULTRASOUND TECHNIQUE: Gray-scale sonography with compression, as well as color and duplex ultrasound, were performed to evaluate the deep venous system(s) from the level of the common femoral vein through the popliteal and proximal calf veins. COMPARISON:  None. FINDINGS: VENOUS Normal compressibility of the common femoral, superficial femoral, and popliteal veins, as well as the visualized calf veins. Visualized portions of profunda femoral vein and great saphenous vein unremarkable. No filling defects to suggest DVT on grayscale or color Doppler imaging. Doppler waveforms show normal direction of venous flow, normal respiratory plasticity and response to augmentation. Limited views of the contralateral common femoral vein are unremarkable. OTHER Ill-defined fluid/edema within the subcutaneous soft tissues of both calves. Limitations: none IMPRESSION: 1. No DVT seen, bilateral lower extremities. 2. Fluid/edema within the subcutaneous soft tissues of both calves. Electronically Signed   By: Franki Cabot M.D.   On: 06/17/2020 14:10   DG Chest Port 1 View  Result Date: 06/23/2020 CLINICAL DATA:  Acute respiratory failure. EXAM: PORTABLE CHEST 1 VIEW COMPARISON:  06/22/2020.  CT 06/21/2020. FINDINGS: Endotracheal tube, NG tube, right IJ line stable position. Stable cardiomegaly. Low lung volumes with bibasilar atelectasis. No pleural effusion or pneumothorax. Free air in the right hemidiaphragm best identified by prior CT. IMPRESSION: 1.  Endotracheal tube, NG tube, right IJ line stable position. 2.  Stable cardiomegaly. 3. Low lung volumes with bibasilar atelectasis again noted. Chest is stable from prior exam. 4. Free air under the right hemidiaphragm best identified by prior CT. Electronically Signed   By: Marcello Moores  Register   On: 06/23/2020 07:00   DG Chest Port 1 View  Result Date: 06/22/2020 CLINICAL DATA:  Post intubation EXAM:  PORTABLE CHEST 1 VIEW COMPARISON:  06/21/2020, CT 03/07/2020 FINDINGS: Interval intubation, tip of the endotracheal tube is about 14 mm superior to the carina. Right-sided central venous catheter tip over the cavoatrial region. Esophageal tube tip below the diaphragm but incompletely visualized. Cardiomegaly with aortic atherosclerosis. No pleural effusion or consolidation. Minimal lucency at the right diaphragm could reflect small free air demonstrated on CT. IMPRESSION: 1. Interval intubation with tip of the endotracheal tube about 14 mm superior to the carina. Right IJ central venous catheter  tip over the cavoatrial region. No pneumothorax. 2. Cardiomegaly. No edema or infiltrate. 3. Possible small amount of free air beneath the right diaphragm, demonstrated on previous CT. Electronically Signed   By: Donavan Foil M.D.   On: 06/22/2020 00:14   DG Chest Port 1 View  Result Date: 06/21/2020 CLINICAL DATA:  Shortness of breath.  History of COPD. EXAM: PORTABLE CHEST 1 VIEW COMPARISON:  Single-view of the chest 06/17/2020 and 11/13/2019. CT chest 11/06/2019. FINDINGS: The lungs are emphysematous. Mild subsegmental atelectasis is seen in the lung bases. The lungs are otherwise clear. Heart size is normal. Aortic atherosclerosis. No pneumothorax or pleural fluid. No acute bony abnormality. IMPRESSION: No acute disease. Aortic Atherosclerosis (ICD10-I70.0) and Emphysema (ICD10-J43.9). Electronically Signed   By: Inge Rise M.D.   On: 06/21/2020 13:58   DG Chest Port 1 View  Result Date: 06/17/2020 CLINICAL DATA:  Bilateral foot swelling EXAM: PORTABLE CHEST 1 VIEW COMPARISON:  03/17/2020 FINDINGS: Stable mild cardiomegaly. Atherosclerotic calcification of the aortic knob. Mildly hyperexpanded lungs coarsened bibasilar interstitial markings. No focal airspace consolidation. No overt edema. No significant pleural fluid collection. No pneumothorax. IMPRESSION: Stable mild cardiomegaly. No overt edema or focal  airspace consolidation. Electronically Signed   By: Davina Poke D.O.   On: 06/17/2020 12:30   [place] EKG SITE RITE  Result Date: 06/26/2020 If Site Rite image not attached, placement could not be confirmed due to current cardiac rhythm.    CBC Recent Labs  Lab 06/22/20 0510 06/22/20 1544 06/24/20 0514 06/25/20 0635 06/26/20 0840 06/27/20 0504 06/28/20 0351  WBC 9.2   < > 12.9* 8.6 8.4 8.9 9.0  HGB 7.3*   < > 7.0* 7.4* 8.6* 9.0* 8.7*  HCT 23.0*   < > 22.6* 23.5* 27.8* 28.4* 27.6*  PLT PLT CLUMPING NOTED, COLLECT IN CITRATE FOR CBC   < > 207 169 156 172 172  MCV 105.0*   < > 101.3* 100.0 97.5 96.3 95.8  MCH 33.3   < > 31.4 31.5 30.2 30.5 30.2  MCHC 31.7   < > 31.0 31.5 30.9 31.7 31.5  RDW 18.3*   < > 17.9* 17.3* 17.4* 16.8* 16.3*  LYMPHSABS 1.6  --   --   --   --   --   --   MONOABS 1.1*  --   --   --   --   --   --   EOSABS 0.0  --   --   --   --   --   --   BASOSABS 0.0  --   --   --   --   --   --    < > = values in this interval not displayed.    Chemistries  Recent Labs  Lab 06/22/20 0510 06/22/20 1544 06/23/20 0459 06/23/20 0459 06/24/20 0514 06/25/20 0635 06/26/20 0840 06/27/20 0504 06/28/20 0351  NA 137   < > 135   < > 142 145 144 143 140  K 4.2   < > 4.2   < > 3.2* 3.1* 3.0* 3.6 2.9*  CL 102   < > 102   < > 109 109 104 104 100  CO2 24   < > 21*   < > 25 24 27 28 29   GLUCOSE 221*   < > 281*   < > 72 96 96 82 81  BUN 31*   < > 37*   < > 37* 32* 27* 24* 21  CREATININE 1.40*   < >  2.01*   < > 2.04* 1.94* 1.88* 1.76* 1.75*  CALCIUM 7.4*   < > 7.0*   < > 7.5* 8.0* 8.3* 8.2* 8.0*  MG 1.6*  --  2.2  --   --  2.3  --   --  1.6*  AST 18  --   --   --  25  --   --   --   --   ALT 21  --   --   --  24  --   --   --   --   ALKPHOS 27*  --   --   --  37*  --   --   --   --   BILITOT 0.8  --   --   --  0.6  --   --   --   --    < > = values in this interval not displayed.    ------------------------------------------------------------------------------------------------------------------ No results for input(s): CHOL, HDL, LDLCALC, TRIG, CHOLHDL, LDLDIRECT in the last 72 hours.  Lab Results  Component Value Date   HGBA1C 5.4 06/17/2020   ------------------------------------------------------------------------------------------------------------------ No results for input(s): TSH, T4TOTAL, T3FREE, THYROIDAB in the last 72 hours.  Invalid input(s): FREET3 ------------------------------------------------------------------------------------------------------------------ No results for input(s): VITAMINB12, FOLATE, FERRITIN, TIBC, IRON, RETICCTPCT in the last 72 hours.  Coagulation profile Recent Labs  Lab 06/24/20 0916  INR 1.4*    No results for input(s): DDIMER in the last 72 hours.  Cardiac Enzymes No results for input(s): CKMB, TROPONINI, MYOGLOBIN in the last 168 hours.  Invalid input(s): CK ------------------------------------------------------------------------------------------------------------------    Component Value Date/Time   BNP 87.0 06/17/2020 1134    Vicktoria Muckey M.D on 06/28/2020 at 7:21 PM  Go to [email or web address] - for contact info  [facility] - Office  [phone]

## 2020-06-28 NOTE — TOC Progression Note (Addendum)
Transition of Care [facility]) - Progression Note    Patient Details  Name: Vincent Cooper MRN: [id number] Date of Birth: Jan 13, 1941  Transition of Care [facility]) CM/SW Contact  Salome Arnt, [place] Phone Number: 06/28/2020, 8:32 AM  Clinical Narrative:  LCSW discussed bed offers with pt and pt chooses [facility]. [employer] updated. LCSW attempted to reach daughter to provide update, but voicemail is full. Mardene Celeste at [facility] aware.    Update: Authorization received ([id number], approved for 3 days with start date 8/11, next review date is 8/13). LCSW called [facility] with auth details. Anticipate d/c tomorrow per MD.     Expected Discharge Plan: [place] Barriers to Discharge: Continued Medical Work up  Expected Discharge Plan and Services Expected Discharge Plan: [place] In-house Referral: Clinical Social Work   Post Acute Care Choice: Resumption of Svcs/PTA Provider Living arrangements for the past 2 months: Single Family Home                                       Social Determinants of Health (SDOH) Interventions    Readmission Risk Interventions Readmission Risk Prevention Plan 06/23/2020  Transportation Screening Complete  HRI or Home Care Consult Complete  Palliative Care Screening Not Applicable  Medication Review (RN Care Manager) Complete

## 2020-06-28 NOTE — Progress Notes (Signed)
[facility] Progress Note  7 Days Post-Op  Subjective: No major issues. No complaints. Eating full liquids. Ostomy making stool.   Objective: Vital signs in last 24 hours: Temp:  [97.6 F (36.4 C)-98.2 F (36.8 C)] 97.6 F (36.4 C) (08/11 0900) Pulse Rate:  [69-106] 73 (08/11 0800) Resp:  [14-25] 15 (08/11 0800) BP: (94-148)/(36-71) 118/54 (08/11 0800) SpO2:  [91 %-100 %] 98 % (08/11 0831) Last BM Date: 06/28/20  Intake/Output from previous day: 08/10 0701 - 08/11 0700 In: 240 [P.O.:240] Out: 2400 [Urine:1950; Stool:450] Intake/Output this shift: No intake/output data recorded.  General appearance: alert, cooperative and no distress Resp: normal work of breathing GI: soft, obese, midline wound with no erythema or drainage, ostomy with stool  Lab Results:  Recent Labs    06/27/20 0504 06/28/20 0351  WBC 8.9 9.0  HGB 9.0* 8.7*  HCT 28.4* 27.6*  PLT 172 172   BMET Recent Labs    06/27/20 0504 06/28/20 0351  NA 143 140  K 3.6 2.9*  CL 104 100  CO2 28 29  GLUCOSE 82 81  BUN 24* 21  CREATININE 1.76* 1.75*  CALCIUM 8.2* 8.0*   PT/INR No results for input(s): LABPROT, INR in the last 72 hours.  Studies/Results: [place] EKG SITE RITE  Result Date: 06/26/2020 If Site Rite image not attached, placement could not be confirmed due to current cardiac rhythm.   Anti-infectives: Anti-infectives (From admission, onward)   Start     Dose/Rate Route Frequency Ordered Stop   06/22/20 0030  piperacillin-tazobactam (ZOSYN) IVPB 3.375 g  Status:  Discontinued        3.375 g 12.5 mL/hr over 240 Minutes Intravenous Every 8 hours 06/21/20 1953 06/28/20 0807   06/21/20 1830  cefoTEtan (CEFOTAN) 2 g in sodium chloride 0.9 % 100 mL IVPB        2 g 200 mL/hr over 30 Minutes Intravenous On call to O.R. 06/21/20 1819 06/21/20 2002   06/21/20 1830  fluconazole (DIFLUCAN) IVPB 400 mg  Status:  Discontinued        400 mg 100 mL/hr over 120 Minutes Intravenous Every  24 hours 06/21/20 1828 06/21/20 2340   06/21/20 1630  piperacillin-tazobactam (ZOSYN) IVPB 3.375 g        3.375 g 100 mL/hr over 30 Minutes Intravenous  Once 06/21/20 1615 06/21/20 1751      Assessment/Plan: Vincent Cooper is a62 yo s/p Ex lap, Hartman's for perforated diverticulitis.  PRN for pain IS, OOB Rehab maybe tomorrow Soft diet  Ostomy functioning, RN coming today  H&Hstabilized on heparin gtt for PE history  UOP improving, Cr down Zosyn completed 8/10 for intraabdominal contamination and perforated diverticulitis  CVL in place R IJ but unable to get midline yesterday Dr. Denton Brick trying to get to [facility] SN[facility] tomorrow.  Will see in clinic 8/24 to check wound and ostomy.   LOS: 7 days    Virl Cagey 06/28/2020

## 2020-06-29 LAB — CBC
HCT: 31.1 % — ABNORMAL LOW (ref 39.0–52.0)
Hemoglobin: 9.6 g/dL — ABNORMAL LOW (ref 13.0–17.0)
MCH: 30.5 pg (ref 26.0–34.0)
MCHC: 30.9 g/dL (ref 30.0–36.0)
MCV: 98.7 fL (ref 80.0–100.0)
Platelets: 203 10*3/uL (ref 150–400)
RBC: 3.15 MIL/uL — ABNORMAL LOW (ref 4.22–5.81)
RDW: 16.5 % — ABNORMAL HIGH (ref 11.5–15.5)
WBC: 10.3 10*3/uL (ref 4.0–10.5)
nRBC: 0 % (ref 0.0–0.2)

## 2020-06-29 LAB — GLUCOSE, CAPILLARY
Glucose-Capillary: 143 mg/dL — ABNORMAL HIGH (ref 70–99)
Glucose-Capillary: 110 mg/dL — ABNORMAL HIGH (ref 70–99)
Glucose-Capillary: 113 mg/dL — ABNORMAL HIGH (ref 70–99)
Glucose-Capillary: 166 mg/dL — ABNORMAL HIGH (ref 70–99)
Glucose-Capillary: 180 mg/dL — ABNORMAL HIGH (ref 70–99)
Glucose-Capillary: 212 mg/dL — ABNORMAL HIGH (ref 70–99)
Glucose-Capillary: 229 mg/dL — ABNORMAL HIGH (ref 70–99)
Glucose-Capillary: 93 mg/dL (ref 70–99)

## 2020-06-29 LAB — HEPARIN LEVEL (UNFRACTIONATED): Heparin Unfractionated: 0.17 IU/mL — ABNORMAL LOW (ref 0.30–0.70)

## 2020-06-29 LAB — COMPREHENSIVE METABOLIC PANEL
ALT: 20 U/L (ref 0–44)
AST: 16 U/L (ref 15–41)
Albumin: 2.6 g/dL — ABNORMAL LOW (ref 3.5–5.0)
Alkaline Phosphatase: 38 U/L (ref 38–126)
Anion gap: 11 (ref 5–15)
BUN: 20 mg/dL (ref 8–23)
CO2: 27 mmol/L (ref 22–32)
Calcium: 8.2 mg/dL — ABNORMAL LOW (ref 8.9–10.3)
Chloride: 101 mmol/L (ref 98–111)
Creatinine, Ser: 1.87 mg/dL — ABNORMAL HIGH (ref 0.61–1.24)
GFR calc Af Amer: 39 mL/min — ABNORMAL LOW (ref >60)
GFR calc non Af Amer: 34 mL/min — ABNORMAL LOW (ref >60)
Glucose, Bld: 109 mg/dL — ABNORMAL HIGH (ref 70–99)
Potassium: 4.1 mmol/L (ref 3.5–5.1)
Sodium: 139 mmol/L (ref 135–145)
Total Bilirubin: 1 mg/dL (ref 0.3–1.2)
Total Protein: 5.6 g/dL — ABNORMAL LOW (ref 6.5–8.1)

## 2020-06-29 LAB — SARS CORONAVIRUS 2 BY RT PCR (HOSPITAL ORDER, PERFORMED IN ~~LOC~~ HOSPITAL LAB): SARS Coronavirus 2: NEGATIVE

## 2020-06-29 MED ORDER — INSULIN DETEMIR 100 UNIT/ML ~~LOC~~ SOLN: 7.0000 [IU] | Freq: Two times a day (BID) | SUBCUTANEOUS | 11 refills | Status: DC

## 2020-06-29 MED ORDER — LEVALBUTEROL HCL 0.63 MG/3ML IN NEBU
0.6300 mg | INHALATION_SOLUTION | Freq: Three times a day (TID) | RESPIRATORY_TRACT | Status: DC
  Administered 2020-06-29 (×2): 0.63 mg via RESPIRATORY_TRACT
  Filled 2020-06-29 (×3): qty 3

## 2020-06-29 MED ORDER — APIXABAN 2.5 MG PO TABS
2.5000 mg | ORAL_TABLET | Freq: Two times a day (BID) | ORAL | Status: DC
  Administered 2020-06-29: 2.5 mg via ORAL
  Filled 2020-06-29: qty 1

## 2020-06-29 MED ORDER — INSULIN ASPART 100 UNIT/ML FLEXPEN: 0.0000 [IU] | PEN_INJECTOR | Freq: Three times a day (TID) | SUBCUTANEOUS | 11 refills | Status: DC

## 2020-06-29 MED ORDER — METOPROLOL TARTRATE 25 MG PO TABS: 25.0000 mg | ORAL_TABLET | Freq: Two times a day (BID) | ORAL | 3 refills | Status: AC

## 2020-06-29 MED ORDER — OXYCODONE HCL 5 MG PO TABS: 5.0000 mg | ORAL_TABLET | ORAL | 0 refills | Status: DC | PRN

## 2020-06-29 MED ORDER — APIXABAN 2.5 MG PO TABS: 2.5000 mg | ORAL_TABLET | Freq: Two times a day (BID) | ORAL | 3 refills | Status: DC

## 2020-06-29 MED ORDER — SENNOSIDES-DOCUSATE SODIUM 8.6-50 MG PO TABS: 2.0000 | ORAL_TABLET | Freq: Every day | ORAL | 1 refills | Status: DC

## 2020-06-29 MED ORDER — TAMSULOSIN HCL 0.4 MG PO CAPS: 0.4000 mg | ORAL_CAPSULE | Freq: Every day | ORAL | 1 refills | Status: DC

## 2020-06-29 NOTE — Progress Notes (Addendum)
ANTICOAGULATION CONSULT NOTE -   Pharmacy Consult for heparin >> apixaban Indication: Afib/hx of PE  Allergies  Allergen Reactions  . Other     Patient reports he was allergic to something in an IV he was given but does not know what the substance was. As of 01/19/2020     Patient Measurements: Height: 5\' 9"  (175.3 cm) Weight: 117.9 kg (259 lb 14.8 oz) IBW/kg (Calculated) : 70.7 Heparin Dosing Weight: 97 kg  Vital Signs: Temp: 98.4 F (36.9 C) (08/12 0357) Temp Source: Oral (08/12 0357) BP: 133/55 (08/12 0630) Pulse Rate: 77 (08/12 0630)  Labs: Recent Labs    06/27/20 0504 06/27/20 0504 06/27/20 1400 06/27/20 2141 06/28/20 0351 06/29/20 0348  HGB 9.0*   < >  --   --  8.7* 9.6*  HCT 28.4*  --   --   --  27.6* 31.1*  PLT 172  --   --   --  172 203  HEPARINUNFRC 0.72*  --    < > 0.34 0.31 0.17*  CREATININE 1.76*  --   --   --  1.75* 1.87*   < > = values in this interval not displayed.    Estimated Creatinine Clearance: 41.3 mL/min (A) (by C-G formula based on SCr of 1.87 mg/dL (H)).   Medical History: Past Medical History:  Diagnosis Date  . Chronic gout   . COPD (chronic obstructive pulmonary disease) ([place])    Oxygen dependent  . 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 CAD-RADS 4 - Severe.  ? >50% LM stenosis -->CTFFR -- NOT PHYSIOLOGICALLY SIGNIFICANT; Large Dom RCA-<PDA/PLA.  Diffuse mild to mod plaque (25-49%) prox segment and mod (50-69%) mid-distal;  CTFFR: prox 0.95, mid 0.88, distal 0.86 (not significant); Med-sized LAD w/ long diffuse mod-severe plaque calcified plaque in prox-mid ~ 50 -69%, ? >70% -> CTFFR :   . Diabetes mellitus type II, non insulin dependent ([place])   . Hypertension   . Morbid obesity ([facility])    BMI of 38.5 with multiple risk factors.  . OSA on CPAP   . Pulmonary emboli ([place]) 10/2019   Chest CTA-4 [place] opacification of main PA but there is partially occlusive  main posterior RLL and segmental/segmental branches.  Small thrombus noted in the anterior right middle lobe.  No RV strain.  Scattered aortic atherosclerosis involving great vessels.  Coronary calcification noted.  Marland Kitchen RLS (restless legs syndrome)     Medications:  Medications Prior to Admission  Medication Sig Dispense Refill Last Dose  . albuterol (VENTOLIN HFA) 108 (90 Base) MCG/ACT inhaler Inhale 1 puff into the lungs every 6 (six) hours as needed for wheezing or shortness of breath.   06/21/2020  . apixaban (ELIQUIS) 5 MG TABS tablet Take 1 tablet (5 mg total) by mouth 2 (two) times daily.   06/21/2020 at 0730  . cefdinir (OMNICEF) 300 MG capsule Take 1 capsule (300 mg total) by mouth every 12 (twelve) hours. 14 capsule 0 06/21/2020  . diltiazem (CARDIZEM CD) 180 MG 24 hr capsule Take 2 capsules (360 mg total) by mouth daily. (Patient taking differently: Take 360 mg by mouth at bedtime. ) 90 capsule 2 06/20/2020 at Unknown time  . doxycycline (VIBRAMYCIN) 100 MG capsule Take 100 mg by mouth 2 (two) times daily. For 10 days   06/21/2020  . glimepiride (AMARYL) 2 MG tablet Take 2 mg by mouth daily with breakfast.   06/21/2020  .  guaiFENesin (MUCINEX) 600 MG 12 hr tablet Take 1 tablet (600 mg total) by mouth 2 (two) times daily. 30 tablet 0 Past Month at Unknown time  . lisinopril (ZESTRIL) 40 MG tablet Take 0.5 tablets (20 mg total) by mouth daily. (Patient taking differently: Take 20 mg by mouth at bedtime. ) 30 tablet 6 06/21/2020  . metFORMIN (GLUCOPHAGE) 500 MG tablet Take 500 mg by mouth 2 (two) times daily with a meal.   06/21/2020  . nitroGLYCERIN (NITROSTAT) 0.4 MG SL tablet Place 1 tablet (0.4 mg total) under the tongue every 5 (five) minutes as needed for chest pain. 90 tablet 3 06/21/2020 at Unknown time  . predniSONE (DELTASONE) 10 MG tablet Take 6 tablets (60 mg total) by mouth daily with breakfast. And decrease by one tablet daily 21 tablet 0 06/21/2020 at Unknown time  . rosuvastatin (CRESTOR) 40 MG  tablet Take 1 tablet (40 mg total) by mouth daily. 90 tablet 3 06/21/2020 at Unknown time  . torsemide (DEMADEX) 20 MG tablet Take 20 mg by mouth 2 (two) times daily.   06/21/2020  . TRELEGY ELLIPTA 100-62.5-25 MCG/INH AEPB Inhale 1 puff into the lungs daily.   06/21/2020  . triamcinolone cream (KENALOG) 0.1 % Apply 1 application topically 2 (two) times daily.   06/21/2020    Assessment: Pharmacy consulted to dose heparin in patient with atrial fibrillation/hx of PE.  Patient is on apixaban prior to admission with last dose given 8/4 0730. PE December 2020.      Goal of Therapy:   Monitor platelets by anticoagulation protocol: Yes   Plan:  Stop heparin infusion Start apixaban 2.5 mg twice daily- low dose for > 6 months after PE and high risk of bleeding. Continue to monitor H&H and platelets.  Margot Ables, PharmD Clinical Pharmacist 06/29/2020 7:54 AM

## 2020-06-29 NOTE — Progress Notes (Signed)
EMS arrived to transport pt to [facility]. Pt alert and oriented and stable. Notified daughter of transport.

## 2020-06-29 NOTE — Discharge Instructions (Signed)
Twice daily saline dampened gauze to midline wound, cover with pad and paper tape. Ostomy care per Ostomy RN.   -- 1) you are taking Eliquis/apixaban which is a blood thinner so Avoid ibuprofen/Advil/Aleve/Motrin/Goody Powders/Naproxen/BC powders/Meloxicam/Diclofenac/Indomethacin and other Nonsteroidal anti-inflammatory medications as these will make you more likely to bleed and can cause stomach ulcers, can also cause Kidney problems.   2) please repeat CBC and BMP every Monday starting 07/03/2020 for the next 3 weeks  3)Discontinue Metformin due to kidney concerns  4)insulin aspart (novoLOG) injection 0-10 Units  0-10 Units Subcutaneous, 3 times daily with meals  CBG < 70: Implement Hypoglycemia Standing Orders and refer to Hypoglycemia Standing Orders sidebar report   CBG 70 - 120: 0 unit CBG 121 - 150: 0 unit  CBG 151 - 200: 1 unit  CBG 201 - 250: 2 units  CBG 251 - 300: 4 units  CBG 301 - 350: 6 units   CBG 351 - 400: 8 units  CBG > 400: 10 units  5) ostomy RN to evaluate stoma and change ostomy bag at facility per routine

## 2020-06-29 NOTE — TOC Transition Note (Signed)
Transition of Care [facility]) - CM/SW Discharge Note   Patient Details  Name: [name] MRN: [id number] Date of Birth: 09/02/1941  [facility] CM/SW Contact:  Shade Flood, LCSW Phone Number: 06/29/2020, 3:41 PM   Clinical Narrative:     Pt stable for dc today per MD. Mardene Celeste at [facility] states they are ready for pt. DC clinical sent electronically. RN to call report. EMS contacted for transport.  There are no other TOC needs for dc.   Final next level of care: [place] Barriers to Discharge: Barriers Resolved   Patient Goals and CMS Choice        Discharge Placement              Patient chooses bed at: [facility] Patient to be transferred to facility by: EMS Name of family member notified: [name] Lovelace (dtr) Patient and family notified of of transfer: 06/29/20  Discharge Plan and Services In-house Referral: Clinical Social Work   Post Acute Care Choice: Resumption of Svcs/PTA Provider                               Social Determinants of Health (SDOH) Interventions     Readmission Risk Interventions Readmission Risk Prevention Plan 06/23/2020  Transportation Screening Complete  HRI or Home Care Consult Complete  Palliative Care Screening Not Applicable  Medication Review [employer]) Complete

## 2020-06-29 NOTE — Discharge Summary (Addendum)
Vincent Cooper, is a 79 y.o. male  DOB 11/29/1940  MRN [id number].  Admission date:  06/21/2020  Admitting Physician  Albertine Patricia, MD  Discharge Date:  06/29/2020   Primary MD  [name], DO  Recommendations for primary care physician for things to follow:   Twice daily saline dampened gauze to midline wound, cover with pad and paper tape. Ostomy care per Ostomy RN.   -- 1) you are taking Eliquis/apixaban which is a blood thinner so Avoid ibuprofen/Advil/Aleve/Motrin/Goody Powders/Naproxen/BC powders/Meloxicam/Diclofenac/Indomethacin and other Nonsteroidal anti-inflammatory medications as these will make you more likely to bleed and can cause stomach ulcers, can also cause Kidney problems.   2) please repeat CBC and BMP every Monday starting 07/03/2020 for the next 3 weeks  3)Discontinue Metformin due to kidney concerns  4)insulin aspart (novoLOG) injection 0-10 Units  0-10 Units Subcutaneous, 3 times daily with meals  CBG < 70: Implement Hypoglycemia Standing Orders and refer to Hypoglycemia Standing Orders sidebar report   CBG 70 - 120: 0 unit CBG 121 - 150: 0 unit  CBG 151 - 200: 1 unit  CBG 201 - 250: 2 units  CBG 251 - 300: 4 units  CBG 301 - 350: 6 units   CBG 351 - 400: 8 units  CBG > 400: 10 units  5)Ostomy RN to evaluate stoma and change ostomy bag at facility per routine  Admission Diagnosis  Bowel perforation ([place]) [K63.1] Free intraperitoneal air [K66.8]   Discharge Diagnosis  Bowel perforation ([place]) [K63.1] Free intraperitoneal air [K66.8]    Principal Problem:   Bowel perforation (HCC) Active Problems:   COPD (chronic obstructive pulmonary disease) (HCC)   Diabetes mellitus type II, non insulin dependent (HCC)   OSA and COPD overlap syndrome (HCC)   Essential hypertension   Morbid obesity (HCC)   Chronic diastolic HF (heart failure) (HCC)   Coronary artery disease,  non-occlusive   Hyperlipidemia with target LDL less than 70   Free intraperitoneal air      Past Medical History:  Diagnosis Date  . Chronic gout   . COPD (chronic obstructive pulmonary disease) ([place])    Oxygen dependent  . 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 CAD-RADS 4 - Severe.  ? >50% LM stenosis -->CTFFR -- NOT PHYSIOLOGICALLY SIGNIFICANT; Large Dom RCA-<PDA/PLA.  Diffuse mild to mod plaque (25-49%) prox segment and mod (50-69%) mid-distal;  CTFFR: prox 0.95, mid 0.88, distal 0.86 (not significant); Med-sized LAD w/ long diffuse mod-severe plaque calcified plaque in prox-mid ~ 50 -69%, ? >70% -> CTFFR :   . Diabetes mellitus type II, non insulin dependent ([place])   . Hypertension   . Morbid obesity (HCC)    BMI of 38.5 with multiple risk factors.  . OSA on CPAP   . Pulmonary emboli ([place]) 10/2019   Chest CTA-4 [place] opacification of main PA but there is partially occlusive main posterior RLL and segmental/segmental branches.  Small thrombus noted in the anterior  right middle lobe.  No RV strain.  Scattered aortic atherosclerosis involving great vessels.  Coronary calcification noted.  Marland Kitchen RLS (restless legs syndrome)     Past Surgical History:  Procedure Laterality Date  . Cataract surgery Right   . LAPAROTOMY N/A 06/21/2020   Procedure: EXPLORATORY LAPAROTOMY,  bowel resection, creation ostomy;  Surgeon: Virl Cagey, MD;  Location: [facility];  Service: General;  Laterality: N/A;     HPI  from the history and physical done on the day of admission:     Vincent Cooper  is a 79 y.o. male, with history of morbid obesity, COPD, diabetes mellitus type 2, hypertension, pulmonary embolism on anticoagulation with Eliquis , CAD, OSA, patient with recent hospitalization for cellulitis, he was just discharged yesterday, patient presents to ED secondary to complaints of sudden onset abdominal pain, patient reports patient  started suddenly this morning around 530, reports severe pain woke him up from sleep, reports it is the left lower quadrant, stabbing quality, ports currently improved after he received some morphine, denies any history of such pain in the past, patient denies any history of diverticulitis, reports last colonoscopy was 10 years ago, and he was not told anything normal about it, he denies any vomiting, fever, chills, diarrhea, coffee-ground emesis.  Patient was discharged on prednisone and antibiotics for his cellulitis. - in ED CT abdomen and pelvis was significant for free air in the abdomen, significant for bowel perforation, he had leukocytosis of, he was started empirically on Zosyn, neurosurgery was consulted, plan to go for emergent expiratory laparotomy, triage hospitalist were consulted.    Hospital Course:   Brief Summary:- 79 year old obese man with dm2, htn, cad, COPD, H/o PE on Apixaban admitted on 06/21/2020 with abdominal pain and found to have colonic perforation due to fishbone impaction and underwent partial colectomy, end colostomy emergently on 06/21/2020, postop patient had persistent hypotension most likely due to severe sepsis with septic shock -Hemodynamics improved patient weaned off pressors on 06/23/2020, off vent and extubated on  06/23/2020  A/p 1)Severe sepsis with septic shock--surgical pathology suggest diverticulitis with bowel perforation and abscess resulting in fecal peritonitis peritonitis -and severe sepsis with septic shock --Sepsis pathophysiology appears to have resolved as of 06/23/2020 -Hypotension resolved, discontinued Neo-Synephrine, discontinue vasopressin and discontinued Levophed on 06/23/2020 --- -lactic acid is 2.1 >>2.3>>1.8 -Leukocytosis has resolved, completed IV Zosyn, started 06/21/2020, last dose 06/28/2019  2)New onset A. fib with RVR --tachyarrhythmia in the setting of severe sepsis and septic shock---  --- Started on oral metoprolol 25 mg twice daily  on 06/27/2020  discontinued IV amiodarone on 06/27/2020  -Continue Cardizem - last known EF was 60 to 65% based on echo from December 2020.   --Started IV heparin on 06/26/2020 transitioned from Cardizem back to  Eliquis 2.5 mg twice daily as patient's H&H has remained stable  3)History of PE--- diagnosed in December 2020-- PTA was on Eliquis  -Received Kcentra preop -Anticoagulation as above #2  4)DM2--Continue Levemir 7 units twice daily,  along with sliding scale coverage  5)Obesity/OSA--- history of poor compliance with CPAP PTA,   6) acute symptomatic anemia due to acute blood loss--- baseline hemoglobin of 10 prior to surgery.  Since surgery, he has required transfusion of 2 unit of PRBC which is thought related to blood loss.     - received FFP and Kcentra previously -Hemoglobin stable above 9  7)AKI----acute kidney injury due to reduced renal perfusion in the setting of persistent hypotension in the setting of  severe sepsis with septic shock compounded by lisinopril, torsemide and Metformin use PTA -   creatinine on admission= 1.0 , baseline creatinine = 1.0   , creatinine peaked at 2.04.  Currently 1.8  renally adjust medications, avoid nephrotoxic agents / dehydration  / hypotension -Discontinue lisinopril   -Restarted on IV Lasix due to severe anasarca, --Discharge on p.o. torsemide, weekly BMP [facility] renal function  8)Anasarca.  Patient received IV albumin infusions for hypoalbuminemia.  IV Lasix ordered previously -Discharge on p.o. torsemide, weekly BMP starting on 07/03/2020 at SNF  9) hypokalemia---.  Replaced.    10)Generalized weakness and deconditioning----PT eval appreciated recommends SNF rehab  Disposition -- transfer to SNF rehab   Disposition:  transfer to SNF rehab   Procedures:- 8/4 partial colectomy, end colostomy, laparotomy 8/4RIJ >> 8/4ETT >> -Extubated 06/23/2020  Code Status : FULL   Family  Communication:  Discussed with daughter, wife and granddaughter at the bedside Consults  :  Gen surg/PCCM   DVT Prophylaxis  :   - SCDs /Heparin    Discharge Condition: stable  Follow UP   Contact information for follow-up providers    Virl Cagey, MD Follow up on 07/11/2020.   Specialty: General Surgery Why: wound check and ostomy check, bring ostomy supplies to the clinic  Contact information: [address] [place] [facility] [address] [phone]            Contact information for after-discharge care    Destination    [facility] .   Service: Skilled Nursing Contact information: [address] [place] [phone]                  Consults obtained - gen surg/PCCM  Diet and Activity recommendation:  As advised  Discharge Instructions    Discharge Instructions    Call MD for:  difficulty breathing, headache or visual disturbances   Complete by: As directed    Call MD for:  persistant dizziness or light-headedness   Complete by: As directed    Call MD for:  persistant nausea and vomiting   Complete by: As directed    Call MD for:  redness, tenderness, or signs of infection (pain, swelling, redness, odor or green/yellow discharge around incision site)   Complete by: As directed    Call MD for:  severe uncontrolled pain   Complete by: As directed    Call MD for:  temperature >100.4   Complete by: As directed    Diet - low sodium heart healthy   Complete by: As directed    Diet Carb Modified   Complete by: As directed    Discharge instructions   Complete by: As directed    1) you are taking Eliquis/apixaban which is a blood thinner so Avoid ibuprofen/Advil/Aleve/Motrin/Goody Powders/Naproxen/BC powders/Meloxicam/Diclofenac/Indomethacin and other Nonsteroidal anti-inflammatory medications as these will make you more likely to bleed and can cause stomach ulcers, can  also cause Kidney problems.   2) please repeat CBC and BMP every Monday starting 07/03/2020 for the next 3 weeks  3)Discontinue Metformin due to kidney concerns  4)insulin aspart (novoLOG) injection 0-10 Units  0-10 Units Subcutaneous, 3 times daily with meals  CBG < 70: Implement Hypoglycemia Standing Orders and refer to Hypoglycemia Standing Orders sidebar report   CBG 70 - 120: 0 unit CBG 121 - 150: 0 unit  CBG 151 - 200: 1 unit  CBG 201 -  250: 2 units  CBG 251 - 300: 4 units  CBG 301 - 350: 6 units   CBG 351 - 400: 8 units  CBG > 400: 10 units  5) ostomy RN to evaluate stoma and change ostomy bag at facility per routine   Discharge wound care:   Complete by: As directed    Selling the pentagons and abdominal tape as advised   Increase activity slowly   Complete by: As directed         Discharge Medications     Allergies as of 06/29/2020      Reactions   Other    Patient reports he was allergic to something in an IV he was given but does not know what the substance was. As of 01/19/2020       Medication List    STOP taking these medications   cefdinir 300 MG capsule Commonly known as: OMNICEF   doxycycline 100 MG capsule Commonly known as: VIBRAMYCIN   lisinopril 40 MG tablet Commonly known as: ZESTRIL   metFORMIN 500 MG tablet Commonly known as: GLUCOPHAGE   predniSONE 10 MG tablet Commonly known as: DELTASONE     TAKE these medications   albuterol 108 (90 Base) MCG/ACT inhaler Commonly known as: VENTOLIN HFA Inhale 1 puff into the lungs every 6 (six) hours as needed for wheezing or shortness of breath.   apixaban 2.5 MG Tabs tablet Commonly known as: ELIQUIS Take 1 tablet (2.5 mg total) by mouth 2 (two) times daily. What changed:   medication strength  how much to take   diltiazem 180 MG 24 hr capsule Commonly known as: Cardizem CD Take 2 capsules (360 mg total) by mouth daily. What changed: when to take this   glimepiride 2 MG  tablet Commonly known as: AMARYL Take 2 mg by mouth daily with breakfast.   guaiFENesin 600 MG 12 hr tablet Commonly known as: MUCINEX Take 1 tablet (600 mg total) by mouth 2 (two) times daily.   insulin aspart 100 UNIT/ML FlexPen Commonly known as: NOVOLOG Inject 0-10 Units into the skin 3 (three) times daily with meals. insulin aspart (novoLOG) injection 0-10 Units  0-10 Units Subcutaneous, 3 times daily with meals  CBG < 70: Implement Hypoglycemia Standing Orders and refer to Hypoglycemia Standing Orders sidebar report   CBG 70 - 120: 0 unit CBG 121 - 150: 0 unit  CBG 151 - 200: 1 unit  CBG 201 - 250: 2 units  CBG 251 - 300: 4 units  CBG 301 - 350: 6 units   CBG 351 - 400: 8 units  CBG > 400: 10 units   insulin detemir 100 UNIT/ML injection Commonly known as: LEVEMIR Inject 0.07 mLs (7 Units total) into the skin 2 (two) times daily.   metoprolol tartrate 25 MG tablet Commonly known as: LOPRESSOR Take 1 tablet (25 mg total) by mouth 2 (two) times daily.   nitroGLYCERIN 0.4 MG SL tablet Commonly known as: NITROSTAT Place 1 tablet (0.4 mg total) under the tongue every 5 (five) minutes as needed for chest pain.   oxyCODONE 5 MG immediate release tablet Commonly known as: Oxy IR/ROXICODONE Take 1 tablet (5 mg total) by mouth every 4 (four) hours as needed for moderate pain.   rosuvastatin 40 MG tablet Commonly known as: CRESTOR Take 1 tablet (40 mg total) by mouth daily.   senna-docusate 8.6-50 MG tablet Commonly known as: Senokot-S Take 2 tablets by mouth at bedtime.   tamsulosin 0.4 MG Caps capsule  Commonly known as: FLOMAX Take 1 capsule (0.4 mg total) by mouth daily. Start taking on: June 30, 2020   torsemide 20 MG tablet Commonly known as: DEMADEX Take 20 mg by mouth 2 (two) times daily.   Trelegy Ellipta 100-62.5-25 MCG/INH Aepb Generic drug: Fluticasone-Umeclidin-Vilant Inhale 1 puff into the lungs daily.   triamcinolone cream 0.1 % Commonly known  as: KENALOG Apply 1 application topically 2 (two) times daily.            Discharge Care Instructions  (From admission, onward)         Start     Ordered   06/29/20 0000  Discharge wound care:       Comments: Selling the pentagons and abdominal tape as advised   06/29/20 1516          Major procedures and Radiology Reports - PLEASE review detailed and final reports for all details, in brief -   CT ABDOMEN PELVIS W CONTRAST  Result Date: 06/21/2020 CLINICAL DATA:  Acute left lower quadrant abdominal pain. EXAM: CT ABDOMEN AND PELVIS WITH CONTRAST TECHNIQUE: Multidetector CT imaging of the abdomen and pelvis was performed using the standard protocol following bolus administration of intravenous contrast. CONTRAST:  117mL OMNIPAQUE IOHEXOL 300 MG/ML  SOLN COMPARISON:  None. FINDINGS: Lower chest: No acute abnormality. Hepatobiliary: Solitary gallstone is noted. No biliary dilatation is noted. The liver is unremarkable. Pancreas: Unremarkable. No pancreatic ductal dilatation or surrounding inflammatory changes. Spleen: Normal in size without focal abnormality. Adrenals/Urinary Tract: Adrenal glands are unremarkable. Kidneys are normal, without renal calculi, focal lesion, or hydronephrosis. Bladder is unremarkable. Stomach/Bowel: Moderate amount of free air is noted in the peritoneal space, particularly in the epigastric region. This is concerning for rupture of hollow viscus. However the source is not clearly identified. The stomach and appendix are unremarkable. There is no evidence of bowel obstruction or inflammation. Sigmoid diverticulosis is noted. Vascular/Lymphatic: Aortic atherosclerosis. No enlarged abdominal or pelvic lymph nodes. Reproductive: Prostate is unremarkable. Other: No abdominal wall hernia or abnormality. No abdominopelvic ascites. Musculoskeletal: No acute or significant osseous findings. IMPRESSION: 1. Moderate amount of free air is noted in the peritoneal space,  particularly in the epigastric region. This is concerning for rupture of hollow viscus. However the source is not clearly identified. Critical Value/emergent results were called by telephone at the time of interpretation on 06/21/2020 at 3:45 pm to provider ABIGAIL HARRIS , who verbally acknowledged these results. 2. Sigmoid diverticulosis without inflammation. 3. Solitary gallstone. Aortic Atherosclerosis (ICD10-I70.0). Electronically Signed   By: Marijo Conception M.D.   On: 06/21/2020 15:47   US Venous Img Lower Bilateral  Result Date: 06/17/2020 CLINICAL DATA:  Edema EXAM: BILATERAL LOWER EXTREMITY VENOUS DOPPLER ULTRASOUND TECHNIQUE: Gray-scale sonography with compression, as well as color and duplex ultrasound, were performed to evaluate the deep venous system(s) from the level of the common femoral vein through the popliteal and proximal calf veins. COMPARISON:  None. FINDINGS: VENOUS Normal compressibility of the common femoral, superficial femoral, and popliteal veins, as well as the visualized calf veins. Visualized portions of profunda femoral vein and great saphenous vein unremarkable. No filling defects to suggest DVT on grayscale or color Doppler imaging. Doppler waveforms show normal direction of venous flow, normal respiratory plasticity and response to augmentation. Limited views of the contralateral common femoral vein are unremarkable. OTHER Ill-defined fluid/edema within the subcutaneous soft tissues of both calves. Limitations: none IMPRESSION: 1. No DVT seen, bilateral lower extremities. 2. Fluid/edema within the subcutaneous soft  tissues of both calves. Electronically Signed   By: Franki Cabot M.D.   On: 06/17/2020 14:10   DG Chest Port 1 View  Result Date: 06/23/2020 CLINICAL DATA:  Acute respiratory failure. EXAM: PORTABLE CHEST 1 VIEW COMPARISON:  06/22/2020.  CT 06/21/2020. FINDINGS: Endotracheal tube, NG tube, right IJ line stable position. Stable cardiomegaly. Low lung volumes with  bibasilar atelectasis. No pleural effusion or pneumothorax. Free air in the right hemidiaphragm best identified by prior CT. IMPRESSION: 1.  Endotracheal tube, NG tube, right IJ line stable position. 2.  Stable cardiomegaly. 3. Low lung volumes with bibasilar atelectasis again noted. Chest is stable from prior exam. 4. Free air under the right hemidiaphragm best identified by prior CT. Electronically Signed   By: Marcello Moores  Register   On: 06/23/2020 07:00   DG Chest Port 1 View  Result Date: 06/22/2020 CLINICAL DATA:  Post intubation EXAM: PORTABLE CHEST 1 VIEW COMPARISON:  06/21/2020, CT 03/07/2020 FINDINGS: Interval intubation, tip of the endotracheal tube is about 14 mm superior to the carina. Right-sided central venous catheter tip over the cavoatrial region. Esophageal tube tip below the diaphragm but incompletely visualized. Cardiomegaly with aortic atherosclerosis. No pleural effusion or consolidation. Minimal lucency at the right diaphragm could reflect small free air demonstrated on CT. IMPRESSION: 1. Interval intubation with tip of the endotracheal tube about 14 mm superior to the carina. Right IJ central venous catheter tip over the cavoatrial region. No pneumothorax. 2. Cardiomegaly. No edema or infiltrate. 3. Possible small amount of free air beneath the right diaphragm, demonstrated on previous CT. Electronically Signed   By: Donavan Foil M.D.   On: 06/22/2020 00:14   DG Chest Port 1 View  Result Date: 06/21/2020 CLINICAL DATA:  Shortness of breath.  History of COPD. EXAM: PORTABLE CHEST 1 VIEW COMPARISON:  Single-view of the chest 06/17/2020 and 11/13/2019. CT chest 11/06/2019. FINDINGS: The lungs are emphysematous. Mild subsegmental atelectasis is seen in the lung bases. The lungs are otherwise clear. Heart size is normal. Aortic atherosclerosis. No pneumothorax or pleural fluid. No acute bony abnormality. IMPRESSION: No acute disease. Aortic Atherosclerosis (ICD10-I70.0) and Emphysema  (ICD10-J43.9). Electronically Signed   By: Inge Rise M.D.   On: 06/21/2020 13:58   DG Chest Port 1 View  Result Date: 06/17/2020 CLINICAL DATA:  Bilateral foot swelling EXAM: PORTABLE CHEST 1 VIEW COMPARISON:  03/17/2020 FINDINGS: Stable mild cardiomegaly. Atherosclerotic calcification of the aortic knob. Mildly hyperexpanded lungs coarsened bibasilar interstitial markings. No focal airspace consolidation. No overt edema. No significant pleural fluid collection. No pneumothorax. IMPRESSION: Stable mild cardiomegaly. No overt edema or focal airspace consolidation. Electronically Signed   By: Davina Poke D.O.   On: 06/17/2020 12:30   [place] EKG SITE RITE  Result Date: 06/26/2020 If Site Rite image not attached, placement could not be confirmed due to current cardiac rhythm.   Micro Results     Recent Results (from the past 240 hour(s))  SARS Coronavirus 2 by RT PCR (hospital order, performed in [facility] hospital lab) Nasopharyngeal Nasopharyngeal Swab     Status: None   Collection Time: 06/21/20  1:47 PM   Specimen: Nasopharyngeal Swab  Result Value Ref Range Status   SARS Coronavirus 2 NEGATIVE NEGATIVE Final    Comment: (NOTE) SARS-CoV-2 target nucleic acids are NOT DETECTED.  The SARS-CoV-2 RNA is generally detectable in upper and lower respiratory specimens during the acute phase of infection. The lowest concentration of SARS-CoV-2 viral copies this assay can detect is 250 copies /  mL. A negative result does not preclude SARS-CoV-2 infection and should not be used as the sole basis for treatment or other patient management decisions.  A negative result may occur with improper specimen collection / handling, submission of specimen other than nasopharyngeal swab, presence of viral mutation(s) within the areas targeted by this assay, and inadequate number of viral copies (<250 copies / mL). A negative result must be combined with clinical observations, patient history, and  epidemiological information.  Fact Sheet for Patients:   StrictlyIdeas.no  Fact Sheet for Healthcare Providers: BankingDealers.co.za  This test is not yet approved or  cleared by the [place] FDA and has been authorized for detection and/or diagnosis of SARS-CoV-2 by FDA under an Emergency Use Authorization (EUA).  This EUA will remain in effect (meaning this test can be used) for the duration of the COVID-19 declaration under Section 564(b)(1) of the Act, 21 U.S.C. section 360bbb-3(b)(1), unless the authorization is terminated or revoked sooner.  Performed at [facility], [address], [place], [place] [address]   MRSA PCR Screening     Status: None   Collection Time: 06/21/20 11:31 PM   Specimen: Nasal Mucosa; Nasopharyngeal  Result Value Ref Range Status   MRSA by PCR NEGATIVE NEGATIVE Final    Comment:        The GeneXpert MRSA Assay (FDA approved for NASAL specimens only), is one component of a comprehensive MRSA colonization surveillance program. It is not intended to diagnose MRSA infection nor to guide or monitor treatment for MRSA infections. Performed at [facility], [address] [place], [place] [address]   SARS Coronavirus 2 by RT PCR (hospital order, performed in [facility] hospital lab) Nasopharyngeal Nasopharyngeal Swab     Status: None   Collection Time: 06/29/20 10:26 AM   Specimen: Nasopharyngeal Swab  Result Value Ref Range Status   SARS Coronavirus 2 NEGATIVE NEGATIVE Final    Comment: (NOTE) SARS-CoV-2 target nucleic acids are NOT DETECTED.  The SARS-CoV-2 RNA is generally detectable in upper and lower respiratory specimens during the acute phase of infection. The lowest concentration of SARS-CoV-2 viral copies this assay can detect is 250 copies / mL. A negative result does not preclude SARS-CoV-2 infection and should not be used as the sole basis for treatment or other patient management  decisions.  A negative result may occur with improper specimen collection / handling, submission of specimen other than nasopharyngeal swab, presence of viral mutation(s) within the areas targeted by this assay, and inadequate number of viral copies (<250 copies / mL). A negative result must be combined with clinical observations, patient history, and epidemiological information.  Fact Sheet for Patients:   StrictlyIdeas.no  Fact Sheet for Healthcare Providers: BankingDealers.co.za  This test is not yet approved or  cleared by the [place] FDA and has been authorized for detection and/or diagnosis of SARS-CoV-2 by FDA under an Emergency Use Authorization (EUA).  This EUA will remain in effect (meaning this test can be used) for the duration of the COVID-19 declaration under Section 564(b)(1) of the Act, 21 U.S.C. section 360bbb-3(b)(1), unless the authorization is terminated or revoked sooner.  Performed at [facility], [address], [place], [place] [address]        Today   Subjective    Vincent Cooper today has no new complaints -There is liquid stool in stoma bag, tolerating oral intake well, no fever no vomiting          Patient has been seen and  examined prior to discharge   Objective   Blood pressure (!) 121/43, pulse 86, temperature 98.7 F (37.1 C), temperature source Oral, resp. rate 18, height 5\' 9"  (1.753 m), weight 117.9 kg, SpO2 94 %.   Intake/Output Summary (Last 24 hours) at 06/29/2020 1526 Last data filed at 06/29/2020 0703 Gross per 24 hour  Intake 1790.19 ml  Output 1400 ml  Net 390.19 ml    Exam Gen:- Awake Alert, no acute distress,   HEENT:- [place].AT, No sclera icterus Neck-Supple Neck,No JVD,.  Lungs-  CTAB , good air movement bilaterally CV- S1, S2 normal, regular Abd-  +ve B.Sounds, Abd Soft, No tenderness, ostomy bag in left lower quadrant with liquid stool contents, postop wound with  dressing and packing Extremity/Skin:-1+ edema,   good pulses Psych-affect is appropriate, oriented x3 Neuro-generalized weakness, no new focal deficits, no tremors    Data Review   CBC w Diff:  Lab Results  Component Value Date   WBC 10.3 06/29/2020   HGB 9.6 (L) 06/29/2020   HCT 31.1 (L) 06/29/2020   PLT 203 06/29/2020   LYMPHOPCT 17 06/22/2020   BANDSPCT 26 06/22/2020   MONOPCT 12 06/22/2020   EOSPCT 0 06/22/2020   BASOPCT 0 06/22/2020   CMP:  Lab Results  Component Value Date   NA 139 06/29/2020   NA 141 03/20/2020   K 4.1 06/29/2020   CL 101 06/29/2020   CO2 27 06/29/2020   BUN 20 06/29/2020   BUN 22 03/20/2020   CREATININE 1.87 (H) 06/29/2020   PROT 5.6 (L) 06/29/2020   PROT 6.2 03/20/2020   ALBUMIN 2.6 (L) 06/29/2020   ALBUMIN 4.1 03/20/2020   BILITOT 1.0 06/29/2020   BILITOT 0.4 03/20/2020   ALKPHOS 38 06/29/2020   AST 16 06/29/2020   ALT 20 06/29/2020   Total Discharge time is about 33 minutes  Roxan Hockey M.D on 06/29/2020 at 3:26 PM  Go to [email or web address] -  for contact info  [facility] - Office  [phone]

## 2020-07-11 ENCOUNTER — Encounter: Payer: Self-pay | Admitting: General Surgery

## 2020-07-11 ENCOUNTER — Ambulatory Visit (INDEPENDENT_AMBULATORY_CARE_PROVIDER_SITE_OTHER): Payer: Self-pay | Admitting: General Surgery

## 2020-07-11 ENCOUNTER — Other Ambulatory Visit: Payer: Self-pay

## 2020-07-11 VITALS — BP 106/66 | HR 79 | Temp 98.2°F | Resp 12 | Ht 69.0 in | Wt 246.0 lb

## 2020-07-11 DIAGNOSIS — Z933 Colostomy status: Secondary | ICD-10-CM

## 2020-07-11 DIAGNOSIS — K631 Perforation of intestine (nontraumatic): Secondary | ICD-10-CM

## 2020-07-11 DIAGNOSIS — K9409 Other complications of colostomy: Secondary | ICD-10-CM

## 2020-07-11 NOTE — Patient Instructions (Addendum)
Continue your ostomy care but make the hole in the wafer smaller and make it more oval so the skin is not so red and irritated. Change the ostomy bag today as it is leaking on the edges 07/11/2020.  Continue to pack the midline wound with saline dampened gauze daily to twice daily and cover with a pad and paper tape.  Will see patient back next week to look at ostomy that is retracted and to make sure his family is comfortable with his changes.  Referral to Dr. Delton Coombes with hematology to see if he can come off Eliquis completely in the upcoming weeks. Referral to GI for colonoscopy prior to reversal.   BRING COLOSTOMY SUPPLIES TO NEXT CLINIC VISIT.

## 2020-07-11 NOTE — Progress Notes (Signed)
[facility]   HPI:  79 y.o. Male presents to clinic for post-op follow-up evaluation after an end colostomy for perforated diverticulitis. Patient reports he is doing well and is getting stronger and has no pain. He is getting his appetite back. His ostomy is functioning.   Review of Systems:  Ostomy with stool  No pain or fevers  All other review of systems: otherwise negative   Vital Signs:  BP 106/66   Pulse 79   Temp 98.2 F (36.8 C) (Oral)   Resp 12   Ht 5\' 9"  (1.753 m)   Wt 246 lb (111.6 kg)   SpO2 93%   BMI 36.33 kg/m    Physical Exam:  Physical Exam Vitals reviewed.  HENT:     Head: Normocephalic.  Cardiovascular:     Rate and Rhythm: Normal rate.  Pulmonary:     Effort: Pulmonary effort is normal.  Abdominal:     General: There is no distension.     Palpations: Abdomen is soft.     Tenderness: There is no abdominal tenderness. There is no guarding.     Comments: Midline wound with granulation and no erythema, staples between areas removed, ostomy retracted, digitized and patent, skin around ostomy irritated and red due to ostomy wafer being cut to large, no acute bleeding from skin, fibrinous tissue around edges of ostomy where it retracted   Neurological:     Mental Status: He is alert.         Assessment:  79 y.o. yo Male with a recent Hartman's procedure with end colostomy for perforated diverticulitis. His ostomy has retracted but is functioning. His skin around the ostomy is irritated from the wafer being cut too large. He denies any pain currently. 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   Plan:  Continue your ostomy care but make the hole in the wafer smaller and make it more oval so the skin is not so red and irritated.  Change the ostomy bag today as it is leaking on the edges 07/11/2020.   Continue to pack the midline wound with saline dampened gauze daily to  twice daily and cover with a pad and paper tape.  Will see patient back next week to look at ostomy that is retracted and to make sure his family is comfortable with his changes.  Referral to Dr. Delton Coombes with hematology to see if he can come off Eliquis completely in the upcoming weeks.  Referral to GI for colonoscopy prior to reversal.    BRING COLOSTOMY SUPPLIES TO NEXT CLINIC VISIT.   Future Appointments  Date Time Provider [place]  07/20/2020  3:15 PM Virl Cagey, MD [facility] None  08/02/2020  1:30 PM Derek Jack, MD [facility] None    All of the above recommendations were discussed with the patient, and all of patient's questions were answered to his expressed satisfaction.  [name], MD [facility] [address] [place], [place] [address] [phone] (office)

## 2020-07-12 ENCOUNTER — Encounter (INDEPENDENT_AMBULATORY_CARE_PROVIDER_SITE_OTHER): Payer: Self-pay | Admitting: *Deleted

## 2020-07-20 ENCOUNTER — Other Ambulatory Visit: Payer: Self-pay

## 2020-07-20 ENCOUNTER — Ambulatory Visit (INDEPENDENT_AMBULATORY_CARE_PROVIDER_SITE_OTHER): Payer: Medicare PPO | Admitting: General Surgery

## 2020-07-20 ENCOUNTER — Encounter: Payer: Self-pay | Admitting: General Surgery

## 2020-07-20 VITALS — BP 118/72 | HR 73 | Temp 98.0°F | Resp 16 | Ht 69.0 in | Wt 248.0 lb

## 2020-07-20 DIAGNOSIS — Z933 Colostomy status: Secondary | ICD-10-CM

## 2020-07-20 DIAGNOSIS — K9409 Other complications of colostomy: Secondary | ICD-10-CM

## 2020-07-20 NOTE — Progress Notes (Signed)
[facility]   HPI:  79 y.o. Male presents to clinic for follow-up evaluation with his daughter after end colostomy for perforated diverticulitis. He is doing better and ostomy is working. His daughter is changing the appliance. He still has a large hole in the wafer and some irritated skin despite my recommendations from the SNF. He is out of the SNF now and at home.   Review of Systems:  No fever or chills Regular Bm form ostomy Wound without drainage or erythema Irritate skin below ostomy  All other review of systems: otherwise negative   Vital Signs:  BP 118/72   Pulse 73   Temp 98 F (36.7 C) (Oral)   Resp 16   Ht 5\' 9"  (1.753 m)   Wt 248 lb (112.5 kg)   SpO2 90%   BMI 36.62 kg/m    Physical Exam:  Physical Exam Vitals reviewed.  Cardiovascular:     Rate and Rhythm: Normal rate.  Pulmonary:     Effort: Pulmonary effort is normal.  Abdominal:     General: There is no distension.     Palpations: Abdomen is soft.     Tenderness: There is abdominal tenderness.     Comments: Midline wound with granulation, packing replaced, ostomy retracted but patent on digitization, irritated skin inferior due to wafer hole being large  Musculoskeletal:        General: No swelling.  Skin:    General: Skin is warm.  Neurological:     General: No focal deficit present.     Mental Status: He is alert and oriented to person, place, and time.    Assessment:  79 y.o. yo Male with an end colostomy in place, some retraction but patent and functioning. Warned to monitor for signs of stenosis if the colostomy is not making stool.  Plan:  - Hematology to see if he can come off blood thinner/ history of remote PE   - Colonoscopy in the upcoming weeks/ months to assess for reversal procedure   Barrier ointment on skin before ostomy wafer. Cut ostomy wafer more like oval/ football. Call with issues with ostomy. Keep stools soft and regular. Stool softener and miralax  as needed.  Future Appointments  Date Time Provider [place]  08/02/2020  1:30 PM Derek Jack, MD [facility] None  08/17/2020  3:15 PM Virl Cagey, MD [facility] None     All of the above recommendations were discussed with the patient and patient's family, and all of patient's and family's questions were answered to their expressed satisfaction.  [name], MD [facility] [address] [place], [place] [address] [phone] (office)

## 2020-07-20 NOTE — Patient Instructions (Addendum)
Barrier ointment on skin before ostomy wafer. Cut ostomy wafer more like oval/ football. Referral to hematology to see if can come off Eliquis.  Gi referral for colonoscopy prior to reversal.  Call with issues with ostomy. Keep stools soft and regular. Stool softener and miralax as needed.

## 2020-08-02 ENCOUNTER — Encounter (HOSPITAL_COMMUNITY): Payer: Self-pay | Admitting: Hematology

## 2020-08-02 ENCOUNTER — Other Ambulatory Visit: Payer: Self-pay

## 2020-08-02 ENCOUNTER — Inpatient Hospital Stay (HOSPITAL_COMMUNITY): Payer: Medicare PPO | Attending: Hematology | Admitting: Hematology

## 2020-08-02 DIAGNOSIS — Z833 Family history of diabetes mellitus: Secondary | ICD-10-CM | POA: Diagnosis not present

## 2020-08-02 DIAGNOSIS — I10 Essential (primary) hypertension: Secondary | ICD-10-CM

## 2020-08-02 DIAGNOSIS — G2581 Restless legs syndrome: Secondary | ICD-10-CM

## 2020-08-02 DIAGNOSIS — G4733 Obstructive sleep apnea (adult) (pediatric): Secondary | ICD-10-CM | POA: Diagnosis not present

## 2020-08-02 DIAGNOSIS — Z79899 Other long term (current) drug therapy: Secondary | ICD-10-CM | POA: Insufficient documentation

## 2020-08-02 DIAGNOSIS — J449 Chronic obstructive pulmonary disease, unspecified: Secondary | ICD-10-CM | POA: Insufficient documentation

## 2020-08-02 DIAGNOSIS — Z7901 Long term (current) use of anticoagulants: Secondary | ICD-10-CM | POA: Diagnosis not present

## 2020-08-02 DIAGNOSIS — E119 Type 2 diabetes mellitus without complications: Secondary | ICD-10-CM | POA: Insufficient documentation

## 2020-08-02 DIAGNOSIS — Z8249 Family history of ischemic heart disease and other diseases of the circulatory system: Secondary | ICD-10-CM | POA: Diagnosis not present

## 2020-08-02 DIAGNOSIS — Z9981 Dependence on supplemental oxygen: Secondary | ICD-10-CM | POA: Insufficient documentation

## 2020-08-02 DIAGNOSIS — L03115 Cellulitis of right lower limb: Secondary | ICD-10-CM | POA: Diagnosis not present

## 2020-08-02 DIAGNOSIS — Z800 Family history of malignant neoplasm of digestive organs: Secondary | ICD-10-CM | POA: Insufficient documentation

## 2020-08-02 DIAGNOSIS — Z86718 Personal history of other venous thrombosis and embolism: Secondary | ICD-10-CM | POA: Diagnosis not present

## 2020-08-02 DIAGNOSIS — Z86711 Personal history of pulmonary embolism: Secondary | ICD-10-CM | POA: Insufficient documentation

## 2020-08-02 DIAGNOSIS — Z87891 Personal history of nicotine dependence: Secondary | ICD-10-CM

## 2020-08-02 DIAGNOSIS — Z933 Colostomy status: Secondary | ICD-10-CM | POA: Diagnosis not present

## 2020-08-02 DIAGNOSIS — Z803 Family history of malignant neoplasm of breast: Secondary | ICD-10-CM | POA: Diagnosis not present

## 2020-08-02 DIAGNOSIS — Z794 Long term (current) use of insulin: Secondary | ICD-10-CM | POA: Diagnosis not present

## 2020-08-02 DIAGNOSIS — I2699 Other pulmonary embolism without acute cor pulmonale: Secondary | ICD-10-CM | POA: Insufficient documentation

## 2020-08-02 NOTE — Patient Instructions (Signed)
[place] at [facility] Discharge Instructions  You were seen today by Dr. Delton Coombes. He is a Engineer, civil (consulting).  You were referred to [place] by Dr. Blake Divine.  Dr. Delton Coombes talked about your family history, your history and the events that led to you being here today.  Per the conversation today, Dr. Delton Coombes wants you to continue taking the blood thinners due to your sedentary lifestyle.  We will see you back in 6 months with labs.    Thank you for choosing [place] at [facility] to provide your oncology and hematology care.  To afford each patient quality time with our provider, please arrive at least 15 minutes before your scheduled appointment time.   If you have a lab appointment with the [facility] please come in thru the Main Entrance and check in at the main information desk.  You need to re-schedule your appointment should you arrive 10 or more minutes late.  We strive to give you quality time with our providers, and arriving late affects you and other patients whose appointments are after yours.  Also, if you no show three or more times for appointments you may be dismissed from the clinic at the providers discretion.     Again, thank you for choosing [facility].  Our hope is that these requests will decrease the amount of time that you wait before being seen by our physicians.       _____________________________________________________________  Should you have questions after your visit to [facility], please contact our office at [phone] and follow the prompts.  Our office hours are 8:00 a.m. and 4:30 p.m. Monday - Friday.  Please note that voicemails left after 4:00 p.m. may not be returned until the following business day.  We are closed weekends and major holidays.  You do have access to a nurse 24-7, just call the main number to the clinic [phone] and do not press any options, hold on the  line and a nurse will answer the phone.    For prescription refill requests, have your pharmacy contact our office and allow 72 hours.    Due to Covid, you will need to wear a mask upon entering the hospital. If you do not have a mask, a mask will be given to you at the Main Entrance upon arrival. For doctor visits, patients may have 1 support person age 45 or older with them. For treatment visits, patients can not have anyone with them due to social distancing guidelines and our immunocompromised population.

## 2020-08-02 NOTE — Progress Notes (Signed)
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 male is here because of evaluation of history of DVT, at the request of Dr. [name]. He was placed on Eliquis in December 2020 after going to [facility] having a PE not associated with COVID, most likely from a DVT. He went to [facility] twice in 10/2019, twice in 12/2019, once in 01/2020, twice in 02/2020, once in 05/2020, and once in 06/2020. His colostomy bag was put in when he had a bowel resection on 8/4 by Dr. Constance Haw.  Today he is accompanied by his daughter. He reports feeling good. He is in a wheelchair since he has his second bout of cellulitis in his right foot. He was moderately active when he had the PE back in December 2020 and his energy levels have not returned to pre-PE levels; that is his first bout of PE. He denies having any blood in his ostomy or hematuria. He is using a CPAP machine for the past 6 years. He has PT/OT therapists coming to his home and helping him with rehabilitation.  He lives alone at home and his daughter lives next door. He is mostly sedentary at home right now. He denies having any history of DVT or PE in the family. His mother had colon cancer; daughter had breast cancer. He used to work as a Chief Strategy Officer for 35 years. He quit smoking in 2001, peaking at 2 PPD, for 15 years.   MEDICAL HISTORY:  Past Medical History:  Diagnosis Date  . Chronic gout   . COPD (chronic obstructive pulmonary disease) ([place])    Oxygen dependent  . 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 CAD-RADS 4 - Severe.  ? >50% LM stenosis -->CTFFR -- NOT PHYSIOLOGICALLY SIGNIFICANT; Large Dom RCA-<PDA/PLA.  Diffuse mild  to mod plaque (25-49%) prox segment and mod (50-69%) mid-distal;  CTFFR: prox 0.95, mid 0.88, distal 0.86 (not significant); Med-sized LAD w/ long diffuse mod-severe plaque calcified plaque in prox-mid ~ 50 -69%, ? >70% -> CTFFR :   . Diabetes mellitus type II, non insulin dependent ([place])   . Hypertension   . Morbid obesity (H[facility])    BMI of 38.5 with multiple risk factors.  . OSA on CPAP   . Pulmonary emboli ([place]) 10/2019   Chest CTA-4 Phadke opacification of main PA but there is partially occlusive main posterior RLL and segmental/segmental branches.  Small thrombus noted in the anterior right middle lobe.  No RV strain.  Scattered aortic atherosclerosis involving great vessels.  Coronary calcification noted.  Marland Kitchen RLS (restless legs syndrome)     SURGICAL HISTORY: Past Surgical History:  Procedure Laterality Date  . Cataract surgery Right   . LAPAROTOMY N/A 06/21/2020   Procedure: EXPLORATORY LAPAROTOMY,  bowel resection, creation ostomy;  Surgeon: Virl Cagey, MD;  Location: [facility];  Service: General;  Laterality: N/A;    SOCIAL HISTORY: Social History   Socioeconomic History  . Marital status: Legally Separated    Spouse name: Not on file  . Number of children: 3  . Years of education: Not on file  . Highest education level: Not on file  Occupational History  . Occupation: retired  Tobacco Use  .  Smoking status: Former Research scientist (life sciences)  . Smokeless tobacco: Former Network engineer  . Vaping Use: Never used  Substance and Sexual Activity  . Alcohol use: Not Currently  . Drug use: Never  . Sexual activity: Not on file  Other Topics Concern  . Not on file  Social History Narrative  . Not on file   Social Determinants of Health   Financial Resource Strain:   . Difficulty of Paying Living Expenses: Not on file  Food Insecurity:   . Worried About Charity fundraiser in the Last Year: Not on file  . Ran Out of Food in the Last Year: Not on file  Transportation Needs:   . Lack  of Transportation (Medical): Not on file  . Lack of Transportation (Non-Medical): Not on file  Physical Activity:   . Days of Exercise per Week: Not on file  . Minutes of Exercise per Session: Not on file  Stress:   . Feeling of Stress : Not on file  Social Connections:   . Frequency of Communication with Friends and Family: Not on file  . Frequency of Social Gatherings with Friends and Family: Not on file  . Attends Religious Services: Not on file  . Active Member of Clubs or Organizations: Not on file  . Attends Archivist Meetings: Not on file  . Marital Status: Not on file  Intimate Partner Violence:   . Fear of Current or Ex-Partner: Not on file  . Emotionally Abused: Not on file  . Physically Abused: Not on file  . Sexually Abused: Not on file    FAMILY HISTORY: Family History  Problem Relation Age of Onset  . Colon cancer Mother   . Emphysema Father   . Diverticulitis Sister   . Diabetes Brother   . Heart Problems Maternal Grandmother   . Diabetes Brother   . Lupus Son   . Breast cancer Daughter   . CAD Neg Hx     ALLERGIES:  is allergic to other.  MEDICATIONS:  Current Outpatient Medications  Medication Sig Dispense Refill  . albuterol (VENTOLIN HFA) 108 (90 Base) MCG/ACT inhaler Inhale 1 puff into the lungs every 6 (six) hours as needed for wheezing or shortness of breath.    Marland Kitchen apixaban (ELIQUIS) 2.5 MG TABS tablet Take 1 tablet (2.5 mg total) by mouth 2 (two) times daily. 60 tablet 3  . cephALEXin (KEFLEX) 500 MG capsule Take 500 mg by mouth 3 (three) times daily.    Marland Kitchen diltiazem (TIAZAC) 360 MG 24 hr capsule Take 360 mg by mouth daily.    Marland Kitchen glimepiride (AMARYL) 2 MG tablet Take 2 mg by mouth daily with breakfast.    . guaiFENesin (MUCINEX) 600 MG 12 hr tablet Take 1 tablet (600 mg total) by mouth 2 (two) times daily. 30 tablet 0  . insulin detemir (LEVEMIR) 100 UNIT/ML injection Inject 0.07 mLs (7 Units total) into the skin 2 (two) times daily. 10 mL  11  . metoprolol tartrate (LOPRESSOR) 25 MG tablet Take 1 tablet (25 mg total) by mouth 2 (two) times daily. 60 tablet 3  . nitroGLYCERIN (NITROSTAT) 0.4 MG SL tablet Place 1 tablet (0.4 mg total) under the tongue every 5 (five) minutes as needed for chest pain. 90 tablet 3  . potassium chloride SA (KLOR-CON) 20 MEQ tablet Take 20 mEq by mouth daily.    . rosuvastatin (CRESTOR) 40 MG tablet Take 1 tablet (40 mg total) by mouth daily. 90 tablet 3  .  senna-docusate (SENOKOT-S) 8.6-50 MG tablet Take 2 tablets by mouth at bedtime. 60 tablet 1  . tamsulosin (FLOMAX) 0.4 MG CAPS capsule Take 1 capsule (0.4 mg total) by mouth daily. 30 capsule 1  . torsemide (DEMADEX) 20 MG tablet Take 20 mg by mouth 2 (two) times daily.    . TRELEGY ELLIPTA 100-62.5-25 MCG/INH AEPB Inhale 1 puff into the lungs daily.    Marland Kitchen triamcinolone cream (KENALOG) 0.1 % Apply 1 application topically 2 (two) times daily.     No current facility-administered medications for this visit.    REVIEW OF SYSTEMS:   Review of Systems  Constitutional: Negative for appetite change and fatigue.  Respiratory: Positive for shortness of breath (w/ exertion).   Gastrointestinal: Negative for blood in stool.  Genitourinary: Negative for hematuria.   All other systems reviewed and are negative.    PHYSICAL EXAMINATION: ECOG PERFORMANCE STATUS: 2 - Symptomatic, <50% confined to bed  Vitals:   08/02/20 1324  BP: 122/67  Pulse: 79  Resp: 20  Temp: (!) 96.9 F (36.1 C)  SpO2: 92%   Filed Weights   08/02/20 1324  Weight: 248 lb 14.4 oz (112.9 kg)   Physical Exam Vitals reviewed.  Constitutional:      Appearance: Normal appearance. He is obese.  Cardiovascular:     Rate and Rhythm: Normal rate and regular rhythm.     Pulses: Normal pulses.     Heart sounds: Normal heart sounds.  Pulmonary:     Effort: Pulmonary effort is normal.     Breath sounds: Normal breath sounds.  Neurological:     General: No focal deficit present.       Mental Status: He is alert and oriented to person, place, and time.  Psychiatric:        Mood and Affect: Mood normal.        Behavior: Behavior normal.      LABORATORY DATA:  I have reviewed the data as listed Recent Results (from the past 2160 hour(s))  Glucose, capillary     Status: Abnormal   Collection Time: 06/29/20 12:59 AM  Result Value Ref Range   Glucose-Capillary 143 (H) 70 - 99 mg/dL    Comment: Glucose reference range applies only to samples taken after fasting for at least 8 hours.   Comment 1 Notify RN    Comment 2 Document in Chart   Heparin level (unfractionated)     Status: Abnormal   Collection Time: 06/29/20  3:48 AM  Result Value Ref Range   Heparin Unfractionated 0.17 (L) 0.30 - 0.70 IU/mL    Comment: (NOTE) If heparin results are below expected values, and patient dosage has  been confirmed, suggest follow up testing of antithrombin III levels. Performed at [facility], [address], [place], [place] [address]   CBC     Status: Abnormal   Collection Time: 06/29/20  3:48 AM  Result Value Ref Range   WBC 10.3 4.0 - 10.5 K/uL   RBC 3.15 (L) 4.22 - 5.81 MIL/uL   Hemoglobin 9.6 (L) 13.0 - 17.0 g/dL   HCT 31.1 (L) 39 - 52 %   MCV 98.7 80.0 - 100.0 fL   MCH 30.5 26.0 - 34.0 pg   MCHC 30.9 30.0 - 36.0 g/dL   RDW 16.5 (H) 11.5 - 15.5 %   Platelets 203 150 - 400 K/uL   nRBC 0.0 0.0 - 0.2 %    Comment: Performed at [facility], [address], [place], [place]  [address]  Comprehensive metabolic panel     Status: Abnormal   Collection Time: 06/29/20  3:48 AM  Result Value Ref Range   Sodium 139 135 - 145 mmol/L   Potassium 4.1 3.5 - 5.1 mmol/L    Comment: DELTA CHECK NOTED   Chloride 101 98 - 111 mmol/L   CO2 27 22 - 32 mmol/L   Glucose, Bld 109 (H) 70 - 99 mg/dL    Comment: Glucose reference range applies only to samples taken after fasting for at least 8 hours.   BUN 20 8 - 23 mg/dL   Creatinine, Ser 1.87 (H) 0.61 - 1.24 mg/dL   Calcium 8.2 (L)  8.9 - 10.3 mg/dL   Total Protein 5.6 (L) 6.5 - 8.1 g/dL   Albumin 2.6 (L) 3.5 - 5.0 g/dL   AST 16 15 - 41 U/L   ALT 20 0 - 44 U/L   Alkaline Phosphatase 38 38 - 126 U/L   Total Bilirubin 1.0 0.3 - 1.2 mg/dL   GFR calc non Af Amer 34 (L) >60 mL/min   GFR calc Af Amer 39 (L) >60 mL/min   Anion gap 11 5 - 15    Comment: Performed at [facility], [address] [place], [place] [address]    RADIOGRAPHIC STUDIES: I have personally reviewed the radiological images as listed and agreed with the findings in the report. No results found.  ASSESSMENT:  1.  Unprovoked pulmonary embolism: -CT angio chest on 11/06/2019 shows partially occlusive thrombus in the posterior right segmental and subsegmental pulmonary arterial branch and right middle lobe subsegmental branches with no evidence of right ventricular heart strain. -Patient denied any immobilization from surgery or trauma prior to that episode. -He was started on Eliquis and is tolerating it very well.  He is currently on 2.5 mg twice daily. 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  2.  Social/family history: -He worked as a Writer.  He smoked 2 pack/day for 15 years and quit 20 years ago. -He lives at home by himself.  His daughter lives next door and helps him with day-to-day activities. -No family history of thrombosis.  Mother had colon cancer and another daughter had breast cancer.  PLAN:  1.  Unprovoked pulmonary embolism: -Patient seen at the request of Dr. Constance Haw for recommendations on duration of anticoagulation. -He is tolerating Eliquis which is dose reduced to 2.5 mg twice daily.  No bleeding issues.  No prior history of malignancies. -Recent Doppler on 06/17/2020 was negative for DVT. -He is currently more sedentary than prior to his pulmonary embolism.  He has risk factors  including obesity, hypertension, diabetes, sleep apnea which would put him at high recurrence for thromboembolism. -Based on the risk-benefit analysis, he was recommended to continue indefinite anticoagulation at this time.  We will see him periodically and assess risk-benefit ratio. -He is currently on reduced intensity dosing.  Unknown when he was switched to low intensity anticoagulation.  He is not reporting any bleeding episodes since the start of Eliquis. -I have recommended follow-up in 6 months with labs.    All questions were answered. The patient knows to call the clinic with any problems, questions or concerns.   Derek Jack, MD 08/02/20 1:44 PM  [place] [phone]   I, Milinda Antis, am acting as a scribe for Dr. Sanda Linger.  Kinnie Scales MD, have reviewed the  above documentation for accuracy and completeness, and I agree with the above.

## 2020-08-17 ENCOUNTER — Ambulatory Visit (INDEPENDENT_AMBULATORY_CARE_PROVIDER_SITE_OTHER): Payer: Medicare PPO | Admitting: General Surgery

## 2020-08-17 ENCOUNTER — Encounter: Payer: Self-pay | Admitting: General Surgery

## 2020-08-17 ENCOUNTER — Other Ambulatory Visit: Payer: Self-pay

## 2020-08-17 VITALS — BP 115/51 | HR 82 | Temp 98.5°F | Resp 16 | Ht 69.0 in | Wt 248.0 lb

## 2020-08-17 DIAGNOSIS — K9409 Other complications of colostomy: Secondary | ICD-10-CM

## 2020-08-17 DIAGNOSIS — Z933 Colostomy status: Secondary | ICD-10-CM

## 2020-08-17 DIAGNOSIS — K9403 Colostomy malfunction: Secondary | ICD-10-CM

## 2020-08-17 NOTE — Patient Instructions (Addendum)
Stay on stool softener daily to twice daily. Call if the stool output is decreasing. Make the ostomy wafer hole smaller to protect your skin. GI should be calling ASAP for colonoscopy. Will see you back next week to see if this all is being arranged. Will also go ahead and get Dr. [name] to see and risk stratify you for surgery to make sure he thinks your heart is optimized.

## 2020-08-17 NOTE — Progress Notes (Signed)
[facility] Note   HPI:  79 y.o. 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 His ostomy is retracted. He has been having output and no issues he reports. He says he is doing well overall.   Review of Systems:  Having output from the ostomy No pain Getting stronger  All other review of systems: otherwise negative   Vital Signs:  BP (!) 115/51   Pulse 82   Temp 98.5 F (36.9 C) (Oral)   Resp 16   Ht 5\' 9"  (1.753 m)   Wt 248 lb (112.5 kg)   SpO2 94%   BMI 36.62 kg/m    Physical Exam:  Physical Exam Vitals reviewed.  Cardiovascular:     Rate and Rhythm: Normal rate.  Pulmonary:     Effort: Pulmonary effort is normal.  Abdominal:     General: There is no distension.     Palpations: Abdomen is soft.     Tenderness: There is no abdominal tenderness.     Comments: Ostomy with skin erosions inferiorly from wafer hole being large, ostomy retracted and with stenosis, pinky finger inserted and attempt to dilate area, no pain for patient, patent now, stool in bag, bag changed, midline incision healing with small sliver of granulation tissue remaining   Skin:    General: Skin is warm.  Neurological:     Mental Status: He is alert.      Assessment:  79 y.o. 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  He needs to keep this open and I will see him for manual dilation next week an to ensure not worsening. He needs a colonoscopy prior to reversal too. I have reached out to GI and Cardiology regarding getting him prioritized for getting him set up for surgery.  Plan:  Stay on stool softener daily to twice daily. Call if the stool output is decreasing. Make the ostomy wafer hole smaller to protect your skin. GI should be calling ASAP for colonoscopy. Will see you back next week to see if this all is being arranged. Will also go ahead and get Dr. [name] to see and  risk stratify you for surgery to make sure he thinks your heart is optimized.  Will send Dr. Delton Coombes a message about need for any bridge for anticoagulation given the recommendation that he stays on blood thinner for his sedentary life and history of unprovoked PE   All of the above recommendations were discussed with the patient and patient's family, and all of patient's and family's questions were answered to their expressed satisfaction.  Future Appointments  Date Time Provider [place]  08/24/2020  2:00 PM Virl Cagey, MD RS-RS None  09/14/2020  3:40 PM Leonie Man, MD [facility]  01/30/2021  3:50 PM AP-ACAPA LAB AP-ACAPA None  02/06/2021  4:00 PM Derek Jack, MD AP-ACAPA None    [name], MD [facility] [address] [place], [place] [address] [phone] (office)

## 2020-08-21 ENCOUNTER — Other Ambulatory Visit (INDEPENDENT_AMBULATORY_CARE_PROVIDER_SITE_OTHER): Payer: Self-pay | Admitting: *Deleted

## 2020-08-21 ENCOUNTER — Encounter (INDEPENDENT_AMBULATORY_CARE_PROVIDER_SITE_OTHER): Payer: Self-pay | Admitting: *Deleted

## 2020-08-21 DIAGNOSIS — Z1211 Encounter for screening for malignant neoplasm of colon: Secondary | ICD-10-CM

## 2020-08-24 ENCOUNTER — Ambulatory Visit: Payer: Medicare PPO | Admitting: General Surgery

## 2020-08-30 ENCOUNTER — Emergency Department (HOSPITAL_COMMUNITY)
Admission: EM | Admit: 2020-08-30 | Discharge: 2020-08-30 | Disposition: A | Discharge status: Home / Self Care | Payer: Medicare PPO | Attending: Emergency Medicine | Admitting: Emergency Medicine

## 2020-08-30 ENCOUNTER — Emergency Department (HOSPITAL_COMMUNITY): Payer: Medicare PPO

## 2020-08-30 ENCOUNTER — Encounter (HOSPITAL_COMMUNITY): Payer: Self-pay

## 2020-08-30 ENCOUNTER — Other Ambulatory Visit: Payer: Self-pay

## 2020-08-30 DIAGNOSIS — I110 Hypertensive heart disease with heart failure: Secondary | ICD-10-CM | POA: Diagnosis not present

## 2020-08-30 DIAGNOSIS — Z7901 Long term (current) use of anticoagulants: Secondary | ICD-10-CM | POA: Diagnosis not present

## 2020-08-30 DIAGNOSIS — J449 Chronic obstructive pulmonary disease, unspecified: Secondary | ICD-10-CM | POA: Insufficient documentation

## 2020-08-30 DIAGNOSIS — I5032 Chronic diastolic (congestive) heart failure: Secondary | ICD-10-CM | POA: Diagnosis not present

## 2020-08-30 DIAGNOSIS — I2510 Atherosclerotic heart disease of native coronary artery without angina pectoris: Secondary | ICD-10-CM | POA: Insufficient documentation

## 2020-08-30 DIAGNOSIS — R0602 Shortness of breath: Secondary | ICD-10-CM

## 2020-08-30 DIAGNOSIS — Z7984 Long term (current) use of oral hypoglycemic drugs: Secondary | ICD-10-CM | POA: Diagnosis not present

## 2020-08-30 DIAGNOSIS — U071 COVID-19: Secondary | ICD-10-CM | POA: Diagnosis not present

## 2020-08-30 DIAGNOSIS — Z79899 Other long term (current) drug therapy: Secondary | ICD-10-CM | POA: Insufficient documentation

## 2020-08-30 DIAGNOSIS — E119 Type 2 diabetes mellitus without complications: Secondary | ICD-10-CM | POA: Diagnosis not present

## 2020-08-30 DIAGNOSIS — Z87891 Personal history of nicotine dependence: Secondary | ICD-10-CM | POA: Diagnosis not present

## 2020-08-30 LAB — CBC WITH DIFFERENTIAL/PLATELET
Abs Immature Granulocytes: 0.04 10*3/uL (ref 0.00–0.07)
Basophils Absolute: 0 10*3/uL (ref 0.0–0.1)
Basophils Relative: 0 %
Eosinophils Absolute: 0.7 10*3/uL — ABNORMAL HIGH (ref 0.0–0.5)
Eosinophils Relative: 9 %
HCT: 32.9 % — ABNORMAL LOW (ref 39.0–52.0)
Hemoglobin: 10.5 g/dL — ABNORMAL LOW (ref 13.0–17.0)
Immature Granulocytes: 1 %
Lymphocytes Relative: 22 %
Lymphs Abs: 1.8 10*3/uL (ref 0.7–4.0)
MCH: 28.4 pg (ref 26.0–34.0)
MCHC: 31.9 g/dL (ref 30.0–36.0)
MCV: 88.9 fL (ref 80.0–100.0)
Monocytes Absolute: 0.8 10*3/uL (ref 0.1–1.0)
Monocytes Relative: 10 %
Neutro Abs: 4.6 10*3/uL (ref 1.7–7.7)
Neutrophils Relative %: 58 %
Platelets: 369 10*3/uL (ref 150–400)
RBC: 3.7 MIL/uL — ABNORMAL LOW (ref 4.22–5.81)
RDW: 17.9 % — ABNORMAL HIGH (ref 11.5–15.5)
WBC: 7.9 10*3/uL (ref 4.0–10.5)
nRBC: 0 % (ref 0.0–0.2)

## 2020-08-30 LAB — RESP PANEL BY RT PCR (RSV, FLU A&B, COVID)
Influenza A by PCR: NEGATIVE
Influenza B by PCR: NEGATIVE
Respiratory Syncytial Virus by PCR: NEGATIVE
SARS Coronavirus 2 by RT PCR: POSITIVE — AB

## 2020-08-30 LAB — COMPREHENSIVE METABOLIC PANEL
ALT: 27 U/L (ref 0–44)
AST: 33 U/L (ref 15–41)
Albumin: 2.9 g/dL — ABNORMAL LOW (ref 3.5–5.0)
Alkaline Phosphatase: 37 U/L — ABNORMAL LOW (ref 38–126)
Anion gap: 10 (ref 5–15)
BUN: 11 mg/dL (ref 8–23)
CO2: 29 mmol/L (ref 22–32)
Calcium: 8.9 mg/dL (ref 8.9–10.3)
Chloride: 101 mmol/L (ref 98–111)
Creatinine, Ser: 1.06 mg/dL (ref 0.61–1.24)
GFR, Estimated: >60 mL/min (ref >60)
Glucose, Bld: 77 mg/dL (ref 70–99)
Potassium: 3.4 mmol/L — ABNORMAL LOW (ref 3.5–5.1)
Sodium: 140 mmol/L (ref 135–145)
Total Bilirubin: 0.8 mg/dL (ref 0.3–1.2)
Total Protein: 5.9 g/dL — ABNORMAL LOW (ref 6.5–8.1)

## 2020-08-30 MED ORDER — MUPIROCIN CALCIUM 2 % EX CREA
TOPICAL_CREAM | Freq: Once | CUTANEOUS | Status: AC
  Filled 2020-08-30: qty 15

## 2020-08-30 MED ORDER — DEXAMETHASONE SODIUM PHOSPHATE 10 MG/ML IJ SOLN
6.0000 mg | Freq: Once | INTRAMUSCULAR | Status: AC
  Administered 2020-08-30: 6 mg via INTRAVENOUS
  Filled 2020-08-30: qty 1

## 2020-08-30 NOTE — ED Provider Notes (Signed)
[facility] EMERGENCY DEPARTMENT Provider Note   CSN: [id number] Arrival date & time: 08/30/20  1153     History Chief Complaint  Patient presents with  . Shortness of Breath    Vincent Cooper is a 79 y.o. male with past medical history significant for chronic gout, COPD, CAD, type 2 diabetes, hypertension, PE with long-term anticoagulation therapy on eliquis.  HPI Patient presents to emergency department today with chief complaint of shortness of breath.  Patient tested positive for Covid at the [facility] office on 08/21/2020.  He has been at home quarantining.  He states ever since being diagnosed he felt like he has had persistent shortness of breath.  He has tried using his nebulizer without much relief.  He states he is able to walk as far as usual without stopping becoming out of breath.  He also states he has noticed some swelling in his legs and saw his pcp for that.  He recently had finished a course of antibiotics for possible cellulitis in bilateral legs.  He states he has been elevating his legs which has helped with the swelling.  He admits to nonproductive cough as well.  He denies fever, chills, palpitations, chest pain, abdominal pain, nausea, emesis, urinary symptoms, diarrhea. Additional history obtained from patient's daughter over the phone.  She saw him yesterday for the first time since his Covid diagnosis.  She thought he looked as if he had lost weight.    Patient was found to have a PE in December 2020 likely from a DVT per oncology note after chart review. Patient states he does not wear chronic oxygen and is not supposed to, does not have a tank at home. He does have a cpap and nebulizer machine at home that he admits to using.     Past Medical History:  Diagnosis Date  . Chronic gout   . COPD (chronic obstructive pulmonary disease) ([place])    Oxygen dependent  . Coronary artery disease due to calcified coronary lesion 03/08/2020   CORONARY CT ANGIOGRAM:  Cor Ca2+ Score  1651. CAD-RADS 4 - Severe.  ? >50% LM stenosis -->CTFFR -- NOT PHYSIOLOGICALLY SIGNIFICANT; Large Dom RCA-<PDA/PLA.  Diffuse mild to mod plaque (25-49%) prox segment and mod (50-69%) mid-distal;  CTFFR: prox 0.95, mid 0.88, distal 0.86 (not significant); Med-sized LAD w/ long diffuse mod-severe plaque calcified plaque in prox-mid ~ 50 -69%, ? >70% -> CTFFR :   . Diabetes mellitus type II, non insulin dependent ([place])   . Hypertension   . Morbid obesity (HCC)    BMI of 38.5 with multiple risk factors.  . OSA on CPAP   . Pulmonary emboli ([place]) 10/2019   Chest CTA-4 [place] opacification of main PA but there is partially occlusive main posterior RLL and segmental/segmental branches.  Small thrombus noted in the anterior right middle lobe.  No RV strain.  Scattered aortic atherosclerosis involving great vessels.  Coronary calcification noted.  Marland Kitchen RLS (restless legs syndrome)     Patient Active Problem List   Diagnosis Date Noted  . 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 Pulmonary embolism during treatment with long-term anticoagulation therapy ([place]) 08/02/2020  . Retraction of colostomy ([place]) 07/11/2020  . Colostomy in place [facility]) 07/11/2020  . Bowel perforation ([place]) 06/21/2020  . Free intraperitoneal air   . Cellulitis, leg 06/17/2020  . AKI (acute kidney injury) ([place]) 06/17/2020  . Coronary artery disease, non-occlusive 03/20/2020  . Hyperlipidemia with target LDL less than 70 03/20/2020  . Acute respiratory failure  with hypoxia ([place])   . Bronchiectasis with acute exacerbation ([place])   . Chronic diastolic HF (heart failure) ([place])   . Chest pain with moderate risk for cardiac etiology 01/19/2020  . Shortness of breath 01/19/2020  . Morbid obesity ([place]) 01/19/2020  . COPD with acute exacerbation ([place]) 01/19/2020  . Cardiac murmur 11/07/2019  . COPD (chronic obstructive pulmonary disease) ([place]) 11/07/2019  . Diabetes mellitus type II, non insulin dependent ([place]) 11/07/2019  . OSA and COPD  overlap syndrome ([place]) 11/07/2019  . Essential hypertension 11/07/2019  . Single subsegmental pulmonary embolism without acute cor pulmonale ([place]) 11/06/2019    Past Surgical History:  Procedure Laterality Date  . Cataract surgery Right   . LAPAROTOMY N/A 06/21/2020   Procedure: EXPLORATORY LAPAROTOMY,  bowel resection, creation ostomy;  Surgeon: Virl Cagey, MD;  Location: [facility] ORS;  Service: General;  Laterality: N/A;       Family History  Problem Relation Age of Onset  . Colon cancer Mother   . Emphysema Father   . Diverticulitis Sister   . Diabetes Brother   . Heart Problems Maternal Grandmother   . Diabetes Brother   . Lupus Son   . Breast cancer Daughter   . CAD Neg Hx     Social History   Tobacco Use  . Smoking status: Former Research scientist (life sciences)  . Smokeless tobacco: Former Network engineer  . Vaping Use: Never used  Substance Use Topics  . Alcohol use: Not Currently  . Drug use: Never    Home Medications Prior to Admission medications   Medication Sig Start Date End Date Taking? Authorizing Provider  albuterol (VENTOLIN HFA) 108 (90 Base) MCG/ACT inhaler Inhale 1 puff into the lungs every 6 (six) hours as needed for wheezing or shortness of breath.    [provider]  apixaban (ELIQUIS) 2.5 MG TABS tablet Take 1 tablet (2.5 mg total) by mouth 2 (two) times daily. 06/29/20   Roxan Hockey, MD  cephALEXin (KEFLEX) 500 MG capsule Take 500 mg by mouth 3 (three) times daily. Patient not taking: Reported on 08/30/2020 07/27/20   [provider]  diltiazem (TIAZAC) 360 MG 24 hr capsule Take 360 mg by mouth daily. 07/15/20   [provider]  glimepiride (AMARYL) 2 MG tablet Take 2 mg by mouth daily with breakfast.    [provider]  guaiFENesin (MUCINEX) 600 MG 12 hr tablet Take 1 tablet (600 mg total) by mouth 2 (two) times daily. 01/21/20   Barton Dubois, MD  ibuprofen (ADVIL) 200 MG tablet Take 400 mg by mouth every 6 (six) hours as needed  for moderate pain.    [provider]  insulin detemir (LEVEMIR) 100 UNIT/ML injection Inject 0.07 mLs (7 Units total) into the skin 2 (two) times daily. Patient not taking: Reported on 08/30/2020 06/29/20   Roxan Hockey, MD  metoprolol tartrate (LOPRESSOR) 25 MG tablet Take 1 tablet (25 mg total) by mouth 2 (two) times daily. 06/29/20   Roxan Hockey, MD  nitroGLYCERIN (NITROSTAT) 0.4 MG SL tablet Place 1 tablet (0.4 mg total) under the tongue every 5 (five) minutes as needed for chest pain. 01/19/20 06/21/21  Leonie Man, MD  potassium chloride SA (KLOR-CON) 20 MEQ tablet Take 20 mEq by mouth daily. 07/14/20   [provider]  rosuvastatin (CRESTOR) 40 MG tablet Take 1 tablet (40 mg total) by mouth daily. Patient taking differently: Take 40 mg by mouth at bedtime.  03/20/20 06/21/21  Leonie Man, MD  senna-docusate (SENOKOT-S) 8.6-50 MG tablet Take 2 tablets by mouth at bedtime. Patient not taking: Reported on 08/30/2020 06/29/20 06/29/21  Roxan Hockey, MD  tamsulosin (FLOMAX) 0.4 MG CAPS capsule Take 1 capsule (0.4 mg total) by mouth daily. Patient taking differently: Take 0.4 mg by mouth at bedtime.  06/30/20   Roxan Hockey, MD  torsemide (DEMADEX) 20 MG tablet Take 20 mg by mouth 2 (two) times daily. 05/19/20   [provider]  TRELEGY ELLIPTA 100-62.5-25 MCG/INH AEPB Inhale 1 puff into the lungs daily. 05/12/20   [provider]  triamcinolone cream (KENALOG) 0.1 % Apply 1 application topically 2 (two) times daily. Patient not taking: Reported on 08/30/2020 06/13/20   [provider]    Allergies    Other  Review of Systems   Review of Systems  All other systems are reviewed and are negative for acute change except as noted in the HPI.   Physical Exam Updated Vital Signs BP 117/67 (BP Location: Right Arm)   Pulse 69   Temp 98 F (36.7 C) (Oral)   Resp 18   Ht 5\' 9"  (1.753 m)   Wt 104.3 kg   SpO2 93%   BMI 33.97 kg/m    Physical Exam Vitals and nursing note reviewed.  Constitutional:      General: He is not in acute distress.    Appearance: He is not ill-appearing.  HENT:     Head: Normocephalic and atraumatic.     Right Ear: Tympanic membrane and external ear normal.     Left Ear: Tympanic membrane and external ear normal.     Nose: Nose normal.     Mouth/Throat:     Mouth: Mucous membranes are moist.     Pharynx: Oropharynx is clear.  Eyes:     General: No scleral icterus.       Right eye: No discharge.        Left eye: No discharge.     Extraocular Movements: Extraocular movements intact.     Conjunctiva/sclera: Conjunctivae normal.     Pupils: Pupils are equal, round, and reactive to light.  Neck:     Vascular: No JVD.  Cardiovascular:     Rate and Rhythm: Normal rate and regular rhythm.     Pulses: Normal pulses.          Radial pulses are 2+ on the right side and 2+ on the left side.     Heart sounds: Normal heart sounds.  Pulmonary:     Comments: Lungs patient is tachypneic, respiration rate in the upper 20s during exam.  Clear to auscultation in all fields. Symmetric chest rise. No wheezing, rales, or rhonchi.  He is talking in short sentences. Chest:     Chest wall: No tenderness.  Abdominal:     Comments: Colostomy present with minimal stool output.  Abdomen is soft, non-distended, and non-tender in all quadrants. No rigidity, no guarding. No peritoneal signs.  Musculoskeletal:        General: Normal range of motion.     Cervical back: Normal range of motion.     Right lower leg: 1+ Edema present.     Left lower leg: 1+ Edema present.  Skin:    General: Skin is warm and dry.     Capillary Refill: Capillary refill takes less than 2 seconds.     Comments: Erythema noted to bilateral shins.  There is yellow crusting and small scabs on bilateral shins as well.  No purulent drainage.  Neurological:     Mental Status: He is oriented to person, place, and time.     GCS: GCS eye  subscore is 4. GCS verbal subscore is 5. GCS motor subscore is 6.     Comments: Fluent speech, no facial droop.  Psychiatric:        Behavior: Behavior normal.     ED Results / Procedures / Treatments   Labs (all labs ordered are listed, but only abnormal results are displayed) Labs Reviewed  COMPREHENSIVE METABOLIC PANEL - Abnormal; Notable for the following components:      Result Value   Potassium 3.4 (*)    Total Protein 5.9 (*)    Albumin 2.9 (*)    Alkaline Phosphatase 37 (*)    All other components within normal limits  CBC WITH DIFFERENTIAL/PLATELET - Abnormal; Notable for the following components:   RBC 3.70 (*)    Hemoglobin 10.5 (*)    HCT 32.9 (*)    RDW 17.9 (*)    Eosinophils Absolute 0.7 (*)    All other components within normal limits  RESP PANEL BY RT PCR (RSV, FLU A&B, COVID)    EKG EKG Interpretation  Date/Time:  Wednesday August 30 2020 12:12:18 EDT Ventricular Rate:  74 PR Interval:    QRS Duration: 92 QT Interval:  399 QTC Calculation: 443 R Axis:   59 Text Interpretation: Sinus rhythm Atrial premature complex Confirmed by Nat Christen [phone]) on 08/30/2020 12:46:35 PM   Radiology DG Chest 2 View  Result Date: 08/30/2020 CLINICAL DATA:  Shortness of breath. EXAM: CHEST - 2 VIEW COMPARISON:  Multiple priors, most recent 06/23/2020 FINDINGS: Similar enlarged cardiac silhouette. Aortic atherosclerosis. Prominent inferior mediastinal fat seen on recent CT accounts for the hazy opacity in the left lung base. No consolidation. Likely small bilateral pleural effusions. No discernible pneumothorax. IMPRESSION: 1. Likely small bilateral pleural effusions. Otherwise, no acute cardiopulmonary abnormality. 2. Stable cardiomegaly. Electronically Signed   By: Margaretha Sheffield MD   On: 08/30/2020 13:22    Procedures Procedures (including critical care time)  Medications Ordered in ED Medications  mupirocin cream (BACTROBAN) 2 % (has no administration in  time range)  dexamethasone (DECADRON) injection 6 mg (6 mg Intravenous Given 08/30/20 1513)    ED Course  I have reviewed the triage vital signs and the nursing notes.  Pertinent labs & imaging results that were available during my care of the patient were reviewed by me and considered in my medical decision making (see chart for details).    MDM Rules/Calculators/A&P                          History provided by patient with additional history obtained from chart review and patient's daughter over the phone.   79 yo male with progressively worsening shortness of breath after covid diagnosis x 11 days ago. He is alert, hemodynamically stable, afebrile, non toxic.  He is tachypneic on exam with a respiratory rate in the upper 20s.  His oxygen saturation is 93% on room air.  Lung sounds are clear however patient is talking in short sentences.  He has no chest tenderness or abdominal tenderness.  No edema noted to bilateral lower extremities.  He does have erythema on bilateral shins and what looks to be scabs from oozing wounds.   I viewed pt's chest xray and it does not suggest acute infectious processes. Looks as if patient has small bilateral pleural effusions. EKG shows sinus  rhythm.  CBC without leukocytosis, hemoglobin 10.5 is consistent with baseline. CMP without significant electrolyte derangements, no renal insufficiency.  Patient ambulated from his bed to the bathroom.  Oxygen saturation dropped to 89% at the lowest and would increase back to the mid 90s.  Will give dose of Decadron here.  Patient does not have a positive Covid test in our system that can be seen therefore will test for Covid again today.  Discussed with [facility] infusion clinic and unfortunately patient is outside of the window for Mab infusions as this is day 11 since his positive test.  Will give patient information for post Covid follow-up clinic.  I discussed the importance of following up in the clinic with patient's  daughter who plans to call and set up follow-up for next available. Patient and daughter are agreeable with plan of care.  The patient appears reasonably screened and/or stabilized for discharge and I doubt any other medical condition or other [facility] requiring further screening, evaluation, or treatment in the ED at this time prior to discharge. The patient is safe for discharge with strict return precautions discussed. Recommend pcp follow up as well if symptoms persist. The patient was discussed with and seen by Dr. Lacinda Axon who agrees with the treatment plan.   Vincent Cooper was evaluated in Emergency Department on 08/30/2020 for the symptoms described in the history of present illness. He was evaluated in the context of the global COVID-19 pandemic, which necessitated consideration that the patient might be at risk for infection with the SARS-CoV-2 virus that causes COVID-19. Institutional protocols and algorithms that pertain to the evaluation of patients at risk for COVID-19 are in a state of rapid change based on information released by regulatory bodies including the CDC and federal and state organizations. These policies and algorithms were followed during the patient's care in the ED.   Portions of this note were generated with Lobbyist. Dictation errors may occur despite best attempts at proofreading.     Final Clinical Impression(s) / ED Diagnoses Final diagnoses:  SOB (shortness of breath)    Rx / DC Orders ED Discharge Orders    None       Cherre Robins, PA-C 08/30/20 1547    Nat Christen, MD 09/01/20 1430

## 2020-08-30 NOTE — ED Triage Notes (Signed)
Pt to er room number 5, pt states that he had covid a couple of weeks ago, states that he is here for a low O2 sat, pt denies chest pain.

## 2020-08-30 NOTE — Discharge Instructions (Addendum)
Please call the [facility] today to schedule the next available appointment.  We swabbed you for covid today. I could not see his positive test result from the primary care doctor's office so we tested him today so that it would be in our computer system. You will receive a phone call that the test is positive, which you already know  Make sure you drink plenty of fluids to stay well hydrated.   Return to the emergency department if you have worsening symptoms such as difficulty breathing

## 2020-08-31 ENCOUNTER — Ambulatory Visit (INDEPENDENT_AMBULATORY_CARE_PROVIDER_SITE_OTHER): Payer: Medicare PPO | Admitting: General Surgery

## 2020-08-31 ENCOUNTER — Telehealth: Payer: Self-pay | Admitting: Nurse Practitioner

## 2020-08-31 DIAGNOSIS — K9403 Colostomy malfunction: Secondary | ICD-10-CM

## 2020-08-31 DIAGNOSIS — Z933 Colostomy status: Secondary | ICD-10-CM

## 2020-08-31 NOTE — Telephone Encounter (Signed)
Called to discuss with [name] about Covid symptoms and the use of casirivimab/imdevimab, a combination monoclonal antibody infusion for those with mild to moderate Covid symptoms and at a high risk of hospitalization.    Pt does not qualify for infusion therapy as his symptoms first presented > 10 days prior to timing of infusion. Patient reports he has been symptomatic since 10/3 or 10/4. Tested positive on 10/4 at providers office. Symptoms tier reviewed as well as criteria for ending isolation. Preventative practices reviewed. Patient verbalized understanding   Patient Active Problem List   Diagnosis Date Noted  . 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 Pulmonary embolism during treatment with long-term anticoagulation therapy ([place]) 08/02/2020  . Retraction of colostomy ([place]) 07/11/2020  . 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 Bowel perforation ([place]) 06/21/2020  . Free intraperitoneal air   . Cellulitis, leg 06/17/2020  . AKI (acute kidney injury) ([place]) 06/17/2020  . Coronary artery disease, non-occlusive 03/20/2020  . Hyperlipidemia with target LDL less than 70 03/20/2020  . Acute respiratory failure with hypoxia ([place])   . Bronchiectasis with acute exacerbation ([place])   . Chronic diastolic HF (heart failure) ([place])   . Chest pain with moderate risk for cardiac etiology 01/19/2020  . Shortness of breath 01/19/2020  . Morbid obesity ([place]) 01/19/2020  . COPD with acute exacerbation ([place]) 01/19/2020  . Cardiac murmur 11/07/2019  . COPD (chronic obstructive pulmonary disease) ([place]) 11/07/2019  . Diabetes mellitus type II, non insulin dependent ([place]) 11/07/2019  . OSA and COPD overlap syndrome ([place]) 11/07/2019  . Essential hypertension 11/07/2019  . Single subsegmental pulmonary embolism without acute cor pulmonale ([place]) 11/06/2019    Alda Lea, AGPCNP-BC

## 2020-09-01 NOTE — Progress Notes (Signed)
[facility]   HPI:  79 y.o. Male presents to clinic for follow-up evaluation of his ostomy. He was diagnosed with COVID 10/4 and actually went to the ED yesterday with some worsening SOB and was retested (the note says due to no positive in [facility] system), and was given decadron and had monoclonal antibody infusion. He is feeling better and is with his daughter today. We saw him outside in his truck and we were wearing full PPE.    He has been doing colace BID and miralax at our request as he has a stenotic ostomy site at the skin. He says he has been eating fine and having output from the ostomy. His midline has healed.   Guidelines are saying to aim for about 6 weeks from his testing for someone who had SOB and cough before an elective procedure per the American society of anesthesia.   Review of Systems:  Improved Sob Afebrile All other review of systems: otherwise negative   Vital Signs:  Visit at his Car   Physical Exam:  Physical Exam Vitals reviewed.  Cardiovascular:     Rate and Rhythm: Regular rhythm.  Pulmonary:     Effort: Pulmonary effort is normal.  Abdominal:     General: There is no distension.     Palpations: Abdomen is soft.     Tenderness: There is no abdominal tenderness.     Comments: Ostomy left side with stenotic skin opening, digitized with pinky and first finger, minimal pain, minor bleeding, stool and gas in bag  Skin:    General: Skin is warm.  Neurological:     General: No focal deficit present.     Mental Status: He is alert.     Assessment:  79 y.o. yo Male with a stenotic ostomy that will need reversal sooner rather than later. This is complicated by recent COVID 10/4 and Sob symptoms that have improved. Guidelines would say we should try to hold off for 6 weeks for elective surgery, but any additional time will be helpful.   Plan:  - Colonoscopy 10/20  - Will plan to digitally dilate the track weekly, Dr. Laural Golden will  dilate him next week   -Will see back the next week to digitize the ostomy for dilation. Will try to get him 4-6 weeks out from the [place] infection for surgery.   All of the above recommendations were discussed with the patient and patient's family, and all of patient's and family's questions were answered to their expressed satisfaction.  [name], MD [facility] [address] [place], [place] [address] [phone] (office)

## 2020-09-04 ENCOUNTER — Telehealth (INDEPENDENT_AMBULATORY_CARE_PROVIDER_SITE_OTHER): Payer: Self-pay

## 2020-09-04 ENCOUNTER — Encounter (INDEPENDENT_AMBULATORY_CARE_PROVIDER_SITE_OTHER): Payer: Self-pay | Admitting: *Deleted

## 2020-09-04 NOTE — Telephone Encounter (Signed)
Vincent Cooper, CMA  

## 2020-09-04 NOTE — Pre-Procedure Instructions (Signed)
While looking over patients chart, I discovered he went to ED on 08/31/2020 for worsening SOB from COVID dx on 10/4. Spoke with Lelon Frohlich at Dr Olevia Perches office about it being too soon for any elective procedure since patient was still having SOB 5 days ago. I spoke with Dr Briant Cedar, anesthesiologist, who confirms this. I gave her Dr Lestine Mount number for Dr Laural Golden to contact if he has any questions.

## 2020-09-05 ENCOUNTER — Other Ambulatory Visit (HOSPITAL_COMMUNITY): Payer: Medicare PPO | Attending: Internal Medicine

## 2020-09-05 ENCOUNTER — Encounter (HOSPITAL_COMMUNITY)
Admission: RE | Admit: 2020-09-05 | Discharge: 2020-09-05 | Disposition: A | Discharge status: Home / Self Care | Payer: Medicare PPO | Source: Ambulatory Visit | Attending: Internal Medicine | Admitting: Internal Medicine

## 2020-09-07 ENCOUNTER — Other Ambulatory Visit: Payer: Self-pay

## 2020-09-07 ENCOUNTER — Ambulatory Visit: Payer: Medicare PPO

## 2020-09-07 ENCOUNTER — Ambulatory Visit (INDEPENDENT_AMBULATORY_CARE_PROVIDER_SITE_OTHER): Payer: Medicare PPO | Admitting: General Surgery

## 2020-09-07 DIAGNOSIS — Z933 Colostomy status: Secondary | ICD-10-CM

## 2020-09-07 DIAGNOSIS — K9403 Colostomy malfunction: Secondary | ICD-10-CM

## 2020-09-07 NOTE — Patient Instructions (Signed)
Continue ostomy care. Will get in touch with Dr. Laural Golden to get your rescheduled for colonoscopy.

## 2020-09-07 NOTE — Progress Notes (Signed)
[facility] Note   HPI:  79 y.o. 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 He is still having output and is on miralax and colace. He needs to get a colonoscopy but that was post poned this week. We are trying to get a colonoscopy in the upcoming weeks. He has no further respiratory symptoms from his COVID infection first diagnosed 10/4 and then retested 10/13 to receive MAB treatment.   Review of Systems:  No cough Continued stool in ostomy No pain All other review of systems: otherwise negative   Vital Signs:  Seen outside due to recent COVID infection   Physical Exam:  Physical Exam Vitals reviewed.  Cardiovascular:     Rate and Rhythm: Normal rate.  Pulmonary:     Effort: Pulmonary effort is normal.  Abdominal:     General: There is no distension.     Palpations: Abdomen is soft.     Tenderness: There is no abdominal tenderness.     Comments: Ostomy stenotic, 5th finger and then pointer finger used to dilate area, some minor bleeding, stool in bag, bag replaced       Assessment:  79 y.o. 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 He is doing fair but is going to continue to need dilation of the area until he is reversed.  Plan:  - Discussed colonoscopy timing with Dr. Briant Cedar and Dr. Laural Golden, we should be able to proceed as soon as 10/27 which would be 2 weeks from the 10/13 test in [facility] system -Discussed with family and patient -Keep stools soft and regular   Future Appointments  Date Time Provider [place]  09/14/2020  1:00 PM Virl Cagey, MD [facility] None  09/14/2020  3:40 PM Leonie Man, MD [facility]  10/10/2020  9:30 AM [facility] PAT 2 [facility] None  01/30/2021  3:50 PM [facility] LAB [facility] None  02/06/2021  4:00 PM Derek Jack, MD [facility] None      [name], MD [facility] [address] [place], [place]  [id number] [phone] (office)

## 2020-09-14 ENCOUNTER — Ambulatory Visit (INDEPENDENT_AMBULATORY_CARE_PROVIDER_SITE_OTHER): Payer: Medicare PPO | Admitting: Cardiology

## 2020-09-14 ENCOUNTER — Ambulatory Visit (INDEPENDENT_AMBULATORY_CARE_PROVIDER_SITE_OTHER): Payer: Medicare PPO | Admitting: General Surgery

## 2020-09-14 ENCOUNTER — Encounter: Payer: Self-pay | Admitting: Cardiology

## 2020-09-14 ENCOUNTER — Other Ambulatory Visit: Payer: Self-pay

## 2020-09-14 ENCOUNTER — Encounter: Payer: Self-pay | Admitting: General Surgery

## 2020-09-14 VITALS — BP 119/72 | HR 78 | Temp 98.4°F | Resp 18 | Wt 235.0 lb

## 2020-09-14 VITALS — BP 110/58 | HR 89 | Ht 69.0 in | Wt 235.4 lb

## 2020-09-14 DIAGNOSIS — J449 Chronic obstructive pulmonary disease, unspecified: Secondary | ICD-10-CM

## 2020-09-14 DIAGNOSIS — E785 Hyperlipidemia, unspecified: Secondary | ICD-10-CM

## 2020-09-14 DIAGNOSIS — Z01810 Encounter for preprocedural cardiovascular examination: Secondary | ICD-10-CM

## 2020-09-14 DIAGNOSIS — I2699 Other pulmonary embolism without acute cor pulmonale: Secondary | ICD-10-CM

## 2020-09-14 DIAGNOSIS — I2510 Atherosclerotic heart disease of native coronary artery without angina pectoris: Secondary | ICD-10-CM

## 2020-09-14 DIAGNOSIS — Z933 Colostomy status: Secondary | ICD-10-CM

## 2020-09-14 DIAGNOSIS — G4733 Obstructive sleep apnea (adult) (pediatric): Secondary | ICD-10-CM

## 2020-09-14 DIAGNOSIS — T45515A Adverse effect of anticoagulants, initial encounter: Secondary | ICD-10-CM

## 2020-09-14 DIAGNOSIS — I5032 Chronic diastolic (congestive) heart failure: Secondary | ICD-10-CM

## 2020-09-14 DIAGNOSIS — E119 Type 2 diabetes mellitus without complications: Secondary | ICD-10-CM | POA: Diagnosis not present

## 2020-09-14 DIAGNOSIS — I10 Essential (primary) hypertension: Secondary | ICD-10-CM

## 2020-09-14 DIAGNOSIS — K9403 Colostomy malfunction: Secondary | ICD-10-CM

## 2020-09-14 NOTE — Patient Instructions (Signed)
Continue bowel regimen. Will try to get colonoscopy sooner than December, if unable to will plan to see weekly to dilate.

## 2020-09-14 NOTE — Patient Instructions (Signed)
Medication Instructions:  No changes  *If you need a refill on your cardiac medications before your next appointment, please call your pharmacy*   Lab Work:  fasting lipids  With pre op labs  If you have labs (blood work) drawn today and your tests are completely normal, you will receive your results only by: Marland Kitchen MyChart Message (if you have MyChart) OR . A paper copy in the mail If you have any lab test that is abnormal or we need to change your treatment, we will call you to review the results.   Testing/Procedures: Not needed   Follow-Up: At [facility], you and your health needs are our priority.  As part of our continuing mission to provide you with exceptional heart care, we have created designated Provider Care Teams.  These Care Teams include your primary Cardiologist (physician) and Advanced Practice Providers (APPs -  Physician Assistants and Nurse Practitioners) who all work together to provide you with the care you need, when you need it.  We recommend signing up for the patient portal called "MyChart".  Sign up information is provided on this After Visit Summary.  MyChart is used to connect with patients for Virtual Visits (Telemedicine).  Patients are able to view lab/test results, encounter notes, upcoming appointments, etc.  Non-urgent messages can be sent to your provider as well.   To learn more about what you can do with MyChart, go to [email or web address].    Your next appointment:   6 month(s)  The format for your next appointment:   In Person  Provider:   Glenetta Hew, MD  Other instructions  cleared for surgery

## 2020-09-14 NOTE — Progress Notes (Signed)
[facility] Note   HPI:  79 y.o. 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 Doing well and getting dilated manually by me. Has not had his colonoscopy yet but is getting that soon.  Review of Systems:  No fever or chills Output from ostomy All other review of systems: otherwise negative   Vital Signs:  BP 119/72   Pulse 78   Temp 98.4 F (36.9 C) (Oral)   Resp 18   Wt 235 lb (106.6 kg)   SpO2 94%   BMI 34.70 kg/m    Physical Exam:  Physical Exam Vitals reviewed.  Cardiovascular:     Rate and Rhythm: Normal rate.  Pulmonary:     Effort: Pulmonary effort is normal.  Abdominal:     General: There is no distension.     Palpations: Abdomen is soft.     Tenderness: There is no abdominal tenderness.     Comments: Ostomy in place, stenotic, digitally dilated, some itching and skin irritation around the ostomy     Assessment:  79 y.o. 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 Will need reversal but had COVID and had to postpone.   Plan:  Will see back next week to dilate Will see if we can move his colonoscopy up   Future Appointments  Date Time Provider [place]  09/21/2020  1:00 PM Virl Cagey, MD [facility] None  10/10/2020  9:30 AM [facility] PAT 2 [facility] None  01/30/2021  3:50 PM [facility] LAB [facility] None  02/06/2021  4:00 PM Derek Jack, MD [facility] None      [name], MD [facility] [address] [place], [place] [address] [phone] (office)

## 2020-09-14 NOTE — Progress Notes (Signed)
Primary Care Provider: Manon Hilding, MD Cardiologist: Vincent Hew, MD  General Surgery: Vincent Divine, MD Electrophysiologist: None  Clinic Note: Chief Complaint  Patient presents with  . Hospitalization Follow-up    1 ER visit 2 hospitalizations  . Pre-op Exam    Colonoscopy and ostomy takedown    HPI:    Vincent Cooper is a 79 y.o. 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  This showed significantly elevated coronary calcium score 1651 with a CAD-RADS 4.  There was question about possible left main disease.  Moderate disease in the LAD and LCx.  CT FFR negative. -> He looked and felt much better than he had previously.  He had been treated with a left another round of antibiotics and steroids for pneumonia/COPD exacerbation.  There is suggestion of bronchiectasis.  (He has been observed overnight from 1516 April and then went back to the ER on April 30. => Doing well with diltiazem.  Cardiac standpoint, he denied any chest pain or pressure chest discomfort with coughing.  Baseline exertional dyspnea and fatigue.  Some dizziness.  Not routinely using home oxygen but does use CPAP.  We increased lisinopril, introduced sliding scale Lasix, and started  Recent  Hospitalizations:   July 31-June 20, 2020: Admitted any plan for 3 weeks for worsening leg edema/cellulitis; PCP had treated possible cellulitis with 2 rounds of antibiotics.  He had noticed significant weight loss from 260 pounds during my visit to 246 pounds on the day prior to admission.  Usually sleeps on a recliner but was able to lie back after diuresis.  By PCP reduce furosemide to 20 mg 3 days prior to admission. => Potassium was noted to be 2.2 with creatinine of 1.5.  Potassium was supplemented with IV and oral dosing.  IV antibiotics administered along with Solu-Medrol for COPD exacerbation.  However Solu-Medrol significantly worsened edema.  Discharged on prednisone as well as cefdinir and doxycycline.  He has been treated with vancomycin and cefepime. ->  Suspected atopic dermatitis or hypersensitivity superimposed cellulitis.  Furosemide restarted.  Apixaban continued.  Lisinopril held because of renal dysfunction.   8/4-12/2021Deneise Lever Cooper: Admitted for bowel perforation.  Sudden onset abdominal pain woke him up from sleep.-->   X-Lap revealed colonic perforation.  Fishbone impaction -> partial colectomy with end colostomy on the day admission.  Postop hypotension related to sepsis/septic shock.  Weaned off pressors after 2 days and extubated 12/25/2534  Complicated by A. fib RVR in the setting of sepsis. ->  Initially treated with IV amiodarone and converted to oral metoprolol 25 mg twice daily in addition to diltiazem. ->  Restarted Eliquis 2.5 mg twice daily (have been taking for history of PE  #AKI with creatinine going  up to 2-was 1.8 on discharge.;  Was actually treated with IV Lasix for anasarca and converted to oral torsemide discharge   08/30/2020-Vincent Cooper, ER: Presented with dyspnea.  Noted to be Covid positive on 08/21/2020.  Was doing home quarantine but felt worsening dyspnea.  Tried using nebulizer without help.  Was having significant exertional fatigue.  Also  noted some edema.  Had finished antibiotics for possible cellulitis in his legs.  Afebrile.  Was tachypneic with a rate in the 20s.  SaO2 93% on RA.  Dyspneic to the point of talking in short sentences.  Lungs sounded clear.  CXR did not show acute infectious process but may be some small bilateral effusions.  Normal EKG.  Was outside the window for MAB infusions.  Discharged home.  He was actually seen by Dr. 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  Due to his immobility, the plan was to continue indefinite Eliquis (and the 2.5 mg dosing was approved by heme-onc)  Reviewed  CV studies:    The following studies were reviewed today: (if available, images/films reviewed: From Epic Chart or Care Everywhere) . None:   Interval History:   Vincent Cooper presents here today for follow-up after having Covid pneumonia followed by 2 hospitalizations 1 of which was due to bowel perforation with emergency colectomy.  He is extremely deconditioned now, requesting a wheelchair.  However he seems to be in great spirits.  He is notably lighter now that between 35 pounds having lost further weight perioperatively.  His daughter tells that he has a intermittent rash that is coming and going.  He has been on and off antibiotics for cellulitis.  However his swelling seems to be doing okay.  Over the last couple weeks, his swelling has improved.  He is now on torsemide.  Has completed antibiotics.  He does elevate his feet, but has not been able to tolerate wearing support stockings.  Thankfully, despite all of these adverse events, he has not had any anginal symptoms to speak of.  He clearly had symptoms heart failure with orthopnea noted prior to my seeing him.  Now that he is been aggressively diuresed and has lost weight, he is no longer having as much of the orthopnea.  The edema has been persistent however.  He does sleep with CPAP and that helps quite a  bit.  He remains extremely sedentary and often uses wheelchair.  He has a walker but does not walk very far.  He is hoping that he can have his colostomy takedown after colonoscopy by Dr. Constance Haw.  He has not had any symptoms of rapid regular heartbeats palpitations.  Nothing to suggest recurrence of the A. fib that he had in the hospital.  I suspect that this hospital A. fib was truly related to sepsis and hopefully will not show up again.   CV Review of Symptoms (Summary): positive for - dyspnea on exertion, edema, orthopnea, shortness of breath and Sleeps with CPAP therefore not able to tell if there is PND.  Dyspnea is baseline from COPD negative for - chest pain, irregular heartbeat, palpitations, rapid heart rate or Syncope or near syncope, TIA/amaurosis fugax  The patient does not have symptoms concerning for COVID-19 infection (fever, chills, cough, or new shortness of breath).  He recently had COVID-19  REVIEWED OF SYSTEMS   ROS   I have reviewed and (if needed) personally updated the patient's problem list, medications, allergies, past medical and surgical  history, social and family history.   PAST MEDICAL HISTORY   Past Medical History:  Diagnosis Date  . Chronic gout   . COPD (chronic obstructive pulmonary disease) ([place])    Oxygen dependent  . 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 CAD-RADS 4 - Severe.  ? >50% LM stenosis -->CTFFR -- NOT PHYSIOLOGICALLY SIGNIFICANT; Large Dom RCA-<PDA/PLA.  Diffuse mild to mod plaque (25-49%) prox segment and mod (50-69%) mid-distal;  CTFFR: prox 0.95, mid 0.88, distal 0.86 (not significant); Med-sized LAD w/ long diffuse mod-severe plaque calcified plaque in prox-mid ~ 50 -69%, ? >70% -> CTFFR :   . Diabetes mellitus type II, non insulin dependent ([place])   . Hypertension   . Morbid obesity (HCC)    BMI of 38.5 with multiple risk factors.  . OSA on CPAP   .  Pulmonary emboli ([place]) 10/2019   Chest CTA-4 [place] opacification of main PA but there is partially occlusive main posterior RLL and segmental/segmental branches.  Small thrombus noted in the anterior right middle lobe.  No RV strain.  Scattered aortic atherosclerosis involving great vessels.  Coronary calcification noted.  Marland Kitchen RLS (restless legs syndrome)     PAST SURGICAL HISTORY   Past Surgical History:  Procedure Laterality Date  . Cataract surgery Right   . LAPAROTOMY N/A 06/21/2020   Procedure: EXPLORATORY LAPAROTOMY,  bowel resection, creation ostomy;  Surgeon: Virl Cagey, MD;  Location: [facility];  Service: General;  Laterality: N/A;    Immunization History  Administered Date(s) Administered  . Influenza Inj Mdck Quad With Preservative 08/28/2018  . Influenza,inj,quad, With Preservative 08/29/2015    MEDICATIONS/ALLERGIES   Current Meds  Medication Sig  . albuterol (VENTOLIN HFA) 108 (90 Base) MCG/ACT inhaler Inhale 1 puff into the lungs every 6 (six) hours as needed for wheezing or shortness of breath.  Marland Kitchen apixaban (ELIQUIS) 2.5 MG TABS tablet Take 1 tablet (2.5 mg total) by mouth 2 (two) times daily.  Marland Kitchen diltiazem (TIAZAC) 360 MG 24 hr capsule Take 360 mg by mouth daily.  Marland Kitchen glimepiride (AMARYL) 2 MG tablet Take 2 mg by mouth daily with breakfast.  . guaiFENesin (MUCINEX) 600 MG 12 hr tablet Take 1 tablet (600 mg total) by mouth 2 (two) times daily.  Marland Kitchen ibuprofen (ADVIL) 200 MG tablet Take 400 mg by mouth every 6 (six) hours as needed for moderate pain.  Marland Kitchen insulin detemir (LEVEMIR) 100 UNIT/ML injection Inject 0.07 mLs (7 Units total) into the skin 2 (two) times daily. (Patient not taking: Reported on 09/22/2020)  . metoprolol tartrate (LOPRESSOR) 25 MG tablet Take 1 tablet (25 mg total) by mouth 2 (two) times daily.  . nitroGLYCERIN (NITROSTAT) 0.4 MG SL tablet Place 1 tablet (0.4 mg total) under the tongue every 5 (five) minutes as needed for chest pain.  . potassium chloride  SA (KLOR-CON) 20 MEQ tablet Take 20 mEq by mouth daily.  . rosuvastatin (CRESTOR) 40 MG tablet Take 1 tablet (40 mg total) by mouth daily. (Patient taking differently: Take 40 mg by mouth at bedtime. )  . senna-docusate (SENOKOT-S) 8.6-50 MG tablet Take 2 tablets by mouth at bedtime.  . tamsulosin (FLOMAX) 0.4 MG CAPS capsule Take 1 capsule (0.4 mg total) by mouth daily. (Patient taking differently: Take 0.4 mg by mouth at bedtime. )  . torsemide (DEMADEX) 20 MG tablet Take 20 mg by mouth 2 (two) times daily.  Donnal Debar 200-62.5-25 MCG/INH AEPB  Inhale 1 puff into the lungs daily.   Marland Kitchen triamcinolone cream (KENALOG) 0.1 % Apply 1 application topically 2 (two) times daily.     Allergies  Allergen Reactions  . Other     Patient reports he was allergic to something in an IV he was given but does not know what the substance was. As of 01/19/2020     SOCIAL HISTORY/FAMILY HISTORY   Reviewed in Epic:  Pertinent findings: Still lives alone, but his daughter lives next door.  OBJCTIVE -PE, EKG, labs   Wt Readings from Last 3 Encounters:  09/21/20 239 lb (108.4 kg)  09/14/20 235 lb 6.4 oz (106.8 kg)  09/14/20 235 lb (106.6 kg)    Physical Exam: BP (!) 110/58   Pulse 89   Ht 5\' 9"  (1.753 m)   Wt 235 lb 6.4 oz (106.8 kg)   SpO2 95%   BMI 34.76 kg/m  Physical Exam Constitutional:      General: He is not in acute distress.    Appearance: He is ill-appearing. He is not toxic-appearing.     Comments: Morbidly obese, chronically ill-appearing elderly gentleman.  Seems a little more disheveled than last visit.  But seems relatively comfortable.  Is in a wheelchair.  HENT:     Head: Normocephalic and atraumatic.     Ears:     Comments: Very hard of hearing Neck:     Vascular: No carotid bruit.     Comments: Cannot assess JVD Cardiovascular:     Rate and Rhythm: Normal rate and regular rhythm. Occasional extrasystoles are present.    Chest Wall: PMI is not displaced (Cannot  assess).     Pulses: Decreased pulses (Difficult to palpate due to the edema and body habitus.).     Heart sounds: S1 normal and S2 normal. Heart sounds are distant. Murmur heard. High-pitched harsh crescendo-decrescendo early systolic murmur is present with a grade of 1/6 at the upper right sternal border.  No friction rub. No gallop.   Pulmonary:     Effort: Accessory muscle usage and prolonged expiration present. No respiratory distress.     Breath sounds: Decreased air movement present. Decreased breath sounds (Diffuse) and wheezing (Mild late expiratory) present. No rhonchi or rales.  Musculoskeletal:        General: Normal range of motion.     Cervical back: Normal range of motion and neck supple.     Right lower leg: Edema (1+) present.     Left lower leg: Edema (2+) present.  Neurological:     General: No focal deficit present.     Mental Status: He is alert and oriented to person, place, and time.  Psychiatric:        Mood and Affect: Mood normal.        Behavior: Behavior normal.        Thought Content: Thought content normal.        Judgment: Judgment normal.     Adult ECG Report Not checked  Recent Labs:    Lab Results  Component Value Date   CHOL 264 (H) 03/20/2020   HDL 99 03/20/2020   LDLCALC 135 (H) 03/20/2020   TRIG 151 (H) 06/22/2020   CHOLHDL 2.7 03/20/2020   Lab Results  Component Value Date   CREATININE 1.06 08/30/2020   BUN 11 08/30/2020   NA 140 08/30/2020   K 3.4 (L) 08/30/2020   CL 101 08/30/2020   CO2 29 08/30/2020   CBC Latest Ref Rng & Units  08/30/2020 06/29/2020 06/28/2020  WBC 4.0 - 10.5 K/uL 7.9 10.3 9.0  Hemoglobin 13.0 - 17.0 g/dL 10.5(L) 9.6(L) 8.7(L)  Hematocrit 39 - 52 % 32.9(L) 31.1(L) 27.6(L)  Platelets 150 - 400 K/uL 369 203 172     No results found for: TSH  ASSESSMENT/PLAN    Problem List Items Addressed This Visit    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  Would probably benefit from pulmonary critical care assistance postoperative if there is difficulty with extubation.      Essential hypertension (Chronic)    His blood pressures have been labile in the past.  Currently they are low.  At present he is on stable dose of diltiazem and metoprolol plus torsemide.  Not on ACE inhibitor.  For now we will hold off.  Combination of beta-blocker and calcium blocker are both are protective for his upcoming procedures.      Morbid obesity ([place]) (Chronic)    He had lost further weight with diuresis and then was doing water aerobics etc.  However the last couple months with hospitalizations and illnesses have really set him back quite a bit.  He has however lost quite a bit of weight with his illnesses and surgery. Okay to continue to keep weight off.  Needs to reset his home dry weight based on current weights.      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  Echo did not comment on diastolic dysfunction, however I doubt seriously that a 79 year old does not have diastolic heart failure.  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  Plan:  Continue combination of diltiazem XT 350 mg daily as well as metoprolol 25 mg twice daily.  Blood pressures are pretty well controlled.  He is no longer on lisinopril, this is discontinued due to hypotension and renal failure back in the hospital.  Renal function has improved, but his blood pressures look great with metoprolol in place of lisinopril.  We will hold off on restarting.  Most of his edema is probably venous stasis related, but also probably partially related to pulmonary hypertension as well.  Support stockings will be the best option, however if he can  use Ace wraps that would also be beneficial until he is able to actually put the support stockings back on  He is on torsemide.  Currently seems relatively stable he is rarely taking additional dosing beyond his 20 mg twice daily.  We will closely monitor his renal function and potassium levels as he has had a history of hypokalemia.    He is on potassium supplementation.    I told him to take additional potassium supplement if he takes an additional dose of torsemide.      Coronary artery disease, non-occlusive (Chronic)    Extensive mixed calcified and noncalcified coronary plaque.  I suspect that the blooming artifact probably led to overestimation of disease, however there is still extensive disease.  FFR negative.  Therefore no invasive evaluation required.  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  As such, he is on statin and beta-blocker plus calcium channel blocker. Not on aspirin because of Eliquis.  No active anginal symptoms.  I do not think we need to  do any kind of ischemic evaluation.  He tolerated septic shock with hypotension and A. fib RVR without any significant troponin elevation.  He should go tolerate colonoscopy and colostomy takedown.      Hyperlipidemia with target LDL less than 70 (Chronic)    With extensive CAD noted on coronary calcium score CTA, would shoot for LDL less than 70.  He should have lipids checked this fall.  I am expecting with weight loss that some of his lipids will improved.  I did not order labs because he is really having lots of procedures performed.  Perhaps he can have lipid panel added to some of his preop labs if they are fasting. . Plan: Continue rosuvastatin 40 mg daily.      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  In light of the fact that he had A. fib in  the hospital and had clearly has risk of recurrence of A. fib, this is not unreasonable.  We simply need to reevaluate the dose going forward.  Since he has upcoming procedures pending, I think it is fine to continue with a lower dose.  We will discuss with our clinical pharmacist to see if he is truly on right dose.  My suspicion is that he should be on 5 mg twice daily going forward.  Eliquis can be stopped 2 to 3 days preop for his colonoscopy and/or colostomy takedown.  Does not need to be bridged as he is now coming close to 1 year out from his PE.      Preop cardiovascular exam - Primary    Truthfully, Mr. Blancett is a high risk patient for any procedures or surgery based on his obesity, OHS/OSA-COPD overlap as well as diastolic dysfunction.  He also has chronic lower extremity edema with cellulitis.  He is morbidly obese and has had sepsis related to atrial fibrillation.  He has significant multivessel coronary artery plaque that has been evaluated and is nonobstructive.  I think his biggest preoperative risk is probably from his pulmonary issues with COPD OHS and OSA.  From a cardiac standpoint he has diastolic dysfunction that seems to be relatively stable right now as far as volume status. ->  Could consider right heart cath/Swan-Ganz for invasive evaluation during surgery, but this would not be necessary for colonoscopy.  PREOPERATIVE CARDIAC RISK ASSESSMENT   Revised Cardiac Risk Index:  High Risk Surgery: yes; ostomy takedown-intraperitoneal surgery although it is probably superficial  Defined as Intraperitoneal, intrathoracic or suprainguinal vascular  Active CAD: no; no active angina symptoms.  Relatively recent ischemic evaluation showed nonobstructive CAD.  CHF: yes; seems euvolemic  Cerebrovascular Disease: no; not reported  Diabetes: yes; On Insulin: yes  CKD (Cr >~ 2): no; currently creatinine is back down to baseline normal 1.06  Total: 3  Estimated Risk of Adverse  Outcome: Class IV risk Estimated Risk of MI, PE, VF/VT (Cardiac Arrest), Complete Heart Block: 15%-somewhat reduced but being on combination of diltiazem and beta-blocker.  Additional risks from pulmonary status.  He has had a relatively recent ischemic evaluation and echocardiogram.  Based on the fact that he tolerated emergent surgery followed by sepsis/septic shock complicated by atrial fibrillation, he should not tolerate a more stable scheduled surgery. In addition to septic shock, he subsequently tolerated COVID-19 pneumonia.  He should be fine proceeding to the OR without further  cardiac evaluation, however may want to consider invasive monitoring of his pressures for prolonged surgery.  He is on Eliquis which can be held 2 to 3 days preop for colonoscopy and ostomy takedown.       Other Visit Diagnoses    Type 2 diabetes mellitus without complication, without long-term current use of insulin ([place])       Relevant Orders   Lipid panel      COVID-19 Education: The signs and symptoms of COVID-19 were discussed with the patient and how to seek care for testing (follow up with PCP or arrange E-visit).   The importance of social distancing and COVID-19 vaccination was discussed today.  The patient is practicing social distancing & Masking.   I spent a total of 43minutes with the patient spent in direct patient consultation.  Additional time spent with chart review  / charting (studies, outside notes, etc): 35+ --> 3 hospitalizations reviewed; extensive plan recommendations and medication with other physician reference preop evaluation. Total Time: 42 min   Current medicines are reviewed at length with the patient today.  (+/- concerns) N/A  This visit occurred during the SARS-CoV-2 public health emergency.  Safety protocols were in place, including screening questions prior to the visit, additional usage of staff PPE, and extensive cleaning of exam room while observing appropriate  contact time as indicated for disinfecting solutions.  Notice: This dictation was prepared with Dragon dictation along with smaller phrase technology. Any transcriptional errors that result from this process are unintentional and may not be corrected upon review.  Patient Instructions / Medication Changes & Studies & Tests Ordered   Patient Instructions  Medication Instructions:  No changes  *If you need a refill on your cardiac medications before your next appointment, please call your pharmacy*   Lab Work:  fasting lipids  With pre op labs  If you have labs (blood work) drawn today and your tests are completely normal, you will receive your results only by: Marland Kitchen MyChart Message (if you have MyChart) OR . A paper copy in the mail If you have any lab test that is abnormal or we need to change your treatment, we will call you to review the results.   Testing/Procedures: Not needed   Follow-Up: At [facility], you and your health needs are our priority.  As part of our continuing mission to provide you with exceptional heart care, we have created designated Provider Care Teams.  These Care Teams include your primary Cardiologist (physician) and Advanced Practice Providers (APPs -  Physician Assistants and Nurse Practitioners) who all work together to provide you with the care you need, when you need it.  We recommend signing up for the patient portal called "MyChart".  Sign up information is provided on this After Visit Summary.  MyChart is used to connect with patients for Virtual Visits (Telemedicine).  Patients are able to view lab/test results, encounter notes, upcoming appointments, etc.  Non-urgent messages can be sent to your provider as well.   To learn more about what you can do with MyChart, go to [email or web address].    Your next appointment:   6 month(s)  The format for your next appointment:   In Person  Provider:   Glenetta Hew, MD  Other instructions   cleared for surgery       Studies Ordered:   Orders Placed This Encounter  Procedures  . Lipid panel     Vincent Cooper, M.D., M.S. Interventional Cardiologist   Pager # [phone]  Phone # [phone] [address]. [place], [place] [address]   Thank you for choosing [facility] at [facility]!!

## 2020-09-21 ENCOUNTER — Other Ambulatory Visit: Payer: Self-pay

## 2020-09-21 ENCOUNTER — Ambulatory Visit (INDEPENDENT_AMBULATORY_CARE_PROVIDER_SITE_OTHER): Payer: Medicare PPO | Admitting: General Surgery

## 2020-09-21 ENCOUNTER — Encounter: Payer: Self-pay | Admitting: General Surgery

## 2020-09-21 VITALS — BP 119/62 | HR 66 | Temp 97.6°F | Resp 16 | Ht 69.0 in | Wt 239.0 lb

## 2020-09-21 DIAGNOSIS — Z933 Colostomy status: Secondary | ICD-10-CM

## 2020-09-21 DIAGNOSIS — K9403 Colostomy malfunction: Secondary | ICD-10-CM

## 2020-09-21 NOTE — Patient Instructions (Signed)
Continue bowel regimen and keep stools soft. Get Colonoscopy 09/27/2020.  Plan to see you 10/05/2020 to dilate and get you your bowel preparation for surgery. Plan for surgery 10/11/2020.   Colostomy Reversal Surgery A colostomy reversal is a surgical procedure that is done to reverse a colostomy. In this reversal procedure, the large intestine is disconnected from the opening in the abdomen (stoma). Then, the changes that were made to the intestine during the colostomy will be reversed to restore the flow of stool through the entire intestine. Depending on the type of colostomy being reversed, this may involve one of the following:  Reconnecting the two ends of the intestine that were separated during colostomy surgery.  Closing the opening that was made in the side of the intestine to allow stool to be redirected through the stoma. After this surgery, a stoma and colostomy bag are no longer needed. Stool (feces) can leave your body through the rectum, as it did before you had a colostomy. Tell a health care provider about:  Any allergies you have.  All medicines you are taking, including vitamins, herbs, eye drops, creams, and over-the-counter medicines.  Any problems you or family members have had with anesthetic medicines.  Any blood disorders you have.  Any surgeries you have had.  Any medical conditions you have.  Whether you are pregnant or may be pregnant. What are the risks? Generally, this is a safe procedure. However, problems may occur, including:  Infection.  Bleeding.  Allergic reactions to medicines.  Damage to other structures or organs.  A temporary condition in which the intestines stop moving and working correctly (ileus). This usually goes away in 3-7 days.  A collection of pus (abscess) in the abdomen or pelvis.  Intestinal blockage.  Leaking at the area of the intestine where it was reconnected (anastomotic leak) or where the opening of the stoma  was closed.  Narrowing of the intestine (stricture) at the place where it was reconnected.  Urinary and sexual dysfunction. What happens before the procedure? Medicines Ask your health care provider about:  Changing or stopping your regular medicines. This is especially important if you are taking diabetes medicines or blood thinners.  Taking medicines such as aspirin and ibuprofen. These medicines can thin your blood. Do not take these medicines unless your health care provider tells you to take them.  Taking over-the-counter medicines, vitamins, herbs, and supplements. General instructions  You may have an exam or testing.  Plan to have someone take you home after the procedure.  Plan to have a responsible adult care for you for at least 24 hours after you leave the hospital or clinic. This is important.  Do not use any products that contain nicotine or tobacco, such as cigarettes, e-cigarettes, and chewing tobacco. These can delay incision healing after surgery. If you need help quitting, ask your health care provider.  Ask your health care provider what steps will be taken to help prevent infection. These may include: ? Removing hair at the surgery site. ? Washing skin with a germ-killing soap. ? Antibiotic medicine. What happens during the procedure?   An IV will be inserted into one of your veins.  You may be given: ? A medicine to help you relax (sedative). ? A medicine to make you fall asleep (general anesthetic).  An incision will be made in your abdomen at the site of the stoma.  The large intestine will be disconnected from the abdomen at the site of the stoma.  The next steps will vary depending on the type of colostomy reversal surgery you are having. There are two main types: ? End colostomy reversal. The surgeon will use stitches (sutures) or staples to reconnect the two ends of the intestine that were separated during the end colostomy. ? Loop colostomy  reversal. The surgeon will use sutures or staples to close the opening in the intestine that had been allowing stool to be redirected through the stoma. The intestine will then be put back into its normal position inside the abdomen.  The incision will be closed with sutures, skin glue, or adhesive strips. It may be covered with bandages (dressings). The procedure may vary among health care providers and hospitals. What happens after the procedure?  Your blood pressure, heart rate, breathing rate, and blood oxygen level will be monitored until you leave the hospital or clinic.  You will be given pain medicine as needed.  You will slowly increase your diet and movement as told by your health care provider. Summary  A colostomy reversal is a surgical procedure that is done to reverse a colostomy. After this surgery, stool (feces) can leave your body through the rectum, as it did before you had a colostomy.  Before the procedure, follow instructions from your health care provider about taking medicines and about eating and drinking.  During the procedure, your colostomy will be reversed, and the incision will be closed with sutures, skin glue, or adhesive strips. It may be covered with bandages (dressings).  After the procedure, you will slowly increase your diet and movement as told by your health care provider. This information is not intended to replace advice given to you by your health care provider. Make sure you discuss any questions you have with your health care provider. Document Revised: 04/22/2018 Document Reviewed: 04/22/2018 Elsevier Patient Education  [employer].

## 2020-09-21 NOTE — Progress Notes (Signed)
[facility] Note   HPI:  79 y.o. Male presents to clinic for follow-up evaluation of his colostomy. We have been able to get his colonoscopy moved up to 11/10. He is doing well but is out of wafers today. Stools are soft and regularly. He is eating and getting stronger.   Review of Systems:  No fevers or chills having Bms All other review of systems: otherwise negative   Vital Signs:  BP 119/62   Pulse 66   Temp 97.6 F (36.4 C) (Other (Comment))   Resp 16   Ht 5\' 9"  (1.753 m)   Wt 239 lb (108.4 kg)   SpO2 94%   BMI 35.29 kg/m    Physical Exam:  Physical Exam Vitals reviewed.  Cardiovascular:     Rate and Rhythm: Normal rate.  Pulmonary:     Effort: Pulmonary effort is normal.  Abdominal:     General: There is no distension.     Palpations: Abdomen is soft.     Tenderness: There is no abdominal tenderness.     Comments: Ostomy dilated, minor bleeding, stool in bag, wafer replaced, skin irritation improving, midline healed  Neurological:     Mental Status: He is alert.      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 He is getting his colonoscopy next week and hope to reverse him 11/24 given the stenosis but to give him 6 weeks past his COVID infection.   Plan:  Will get colonoscopy next week to help dilate track Will see 11/18 and get bowel preparation then Will send a message to his cardiologist Dr Selena Batten who he saw 10/28 about upcoming surgery  Future Appointments  Date Time Provider Mariano Colon  09/25/2020  1:15 PM [facility] PAT 2 [facility] None  10/05/2020  1:30 PM Virl Cagey, MD [facility] None  01/30/2021  3:50 PM [facility] LAB [facility] None  02/06/2021  4:00 PM Derek Jack, MD [facility] None      [name], MD [facility] [address] [place], [place] [address] [phone] (office)

## 2020-09-22 NOTE — Patient Instructions (Signed)
Easter Folkes  09/22/2020     @PREFPERIOPPHARMACY @   Your procedure is scheduled on  09/27/2020.  Report to [name] at  1215  P.M.  Call this number if you have problems the morning of surgery:  [phone]   Remember:  Follow the diet and prep instructions given to you by the office.                      Take these medicines the morning of surgery with A SIP OF WATER  Diltiazem, metoprolol. Use your inhaler and your nebulizer before you come. DO NOT take any medications for diabetes the morning of your procedure. FOLLOW ANY INSTRUCTIONS GIVEN TO YOU BY THE OFFICE ABOUT [place].    Do not wear jewelry, make-up or nail polish.  Do not wear lotions, powders, or perfumes. Please wear deodorant and brush your teeth.  Do not shave 48 hours prior to surgery.  Men may shave face and neck.  Do not bring valuables to the hospital.  [facility] is not responsible for any belongings or valuables.  Contacts, dentures or bridgework may not be worn into surgery.  Leave your suitcase in the car.  After surgery it may be brought to your room.  For patients admitted to the hospital, discharge time will be determined by your treatment team.  Patients discharged the day of surgery will not be allowed to drive home.   Name and phone number of your driver:   family Special instructions:   DO NOT smoke the morning of your procedure.  Please read over the following fact sheets that you were given. Anesthesia Post-op Instructions and Care and Recovery After Surgery       Colonoscopy, Adult, Care After This sheet gives you information about how to care for yourself after your procedure. Your health care provider may also give you more specific instructions. If you have problems or questions, contact your health care provider. What can I expect after the procedure? After the procedure, it is common to have:  A small amount of blood in your stool for 24 hours after the  procedure.  Some gas.  Mild cramping or bloating of your abdomen. Follow these instructions at home: Eating and drinking   Drink enough fluid to keep your urine pale yellow.  Follow instructions from your health care provider about eating or drinking restrictions.  Resume your normal diet as instructed by your health care provider. Avoid heavy or fried foods that are hard to digest. Activity  Rest as told by your health care provider.  Avoid sitting for a long time without moving. Get up to take short walks every 1-2 hours. This is important to improve blood flow and breathing. Ask for help if you feel weak or unsteady.  Return to your normal activities as told by your health care provider. Ask your health care provider what activities are safe for you. Managing cramping and bloating   Try walking around when you have cramps or feel bloated.  Apply heat to your abdomen as told by your health care provider. Use the heat source that your health care provider recommends, such as a moist heat pack or a heating pad. ? Place a towel between your skin and the heat source. ? Leave the heat on for 20-30 minutes. ? Remove the heat if your skin turns bright red. This is especially important if you are unable to feel pain,  heat, or cold. You may have a greater risk of getting burned. General instructions  For the first 24 hours after the procedure: ? Do not drive or use machinery. ? Do not sign important documents. ? Do not drink alcohol. ? Do your regular daily activities at a slower pace than normal. ? Eat soft foods that are easy to digest.  Take over-the-counter and prescription medicines only as told by your health care provider.  Keep all follow-up visits as told by your health care provider. This is important. Contact a health care provider if:  You have blood in your stool 2-3 days after the procedure. Get help right away if you have:  More than a small spotting of blood in  your stool.  Large blood clots in your stool.  Swelling of your abdomen.  Nausea or vomiting.  A fever.  Increasing pain in your abdomen that is not relieved with medicine. Summary  After the procedure, it is common to have a small amount of blood in your stool. You may also have mild cramping and bloating of your abdomen.  For the first 24 hours after the procedure, do not drive or use machinery, sign important documents, or drink alcohol.  Get help right away if you have a lot of blood in your stool, nausea or vomiting, a fever, or increased pain in your abdomen. This information is not intended to replace advice given to you by your health care provider. Make sure you discuss any questions you have with your health care provider. Document Revised: 05/31/2019 Document Reviewed: 05/31/2019 Elsevier Patient Education  [place] After These instructions provide you with information about caring for yourself after your procedure. Your health care provider may also give you more specific instructions. Your treatment has been planned according to current medical practices, but problems sometimes occur. Call your health care provider if you have any problems or questions after your procedure. What can I expect after the procedure? After your procedure, you may:  Feel sleepy for several hours.  Feel clumsy and have poor balance for several hours.  Feel forgetful about what happened after the procedure.  Have poor judgment for several hours.  Feel nauseous or vomit.  Have a sore throat if you had a breathing tube during the procedure. Follow these instructions at home: For at least 24 hours after the procedure:      Have a responsible adult stay with you. It is important to have someone help care for you until you are awake and alert.  Rest as needed.  Do not: ? Participate in activities in which you could fall or become  injured. ? Drive. ? Use heavy machinery. ? Drink alcohol. ? Take sleeping pills or medicines that cause drowsiness. ? Make important decisions or sign legal documents. ? Take care of children on your own. Eating and drinking  Follow the diet that is recommended by your health care provider.  If you vomit, drink water, juice, or soup when you can drink without vomiting.  Make sure you have little or no nausea before eating solid foods. General instructions  Take over-the-counter and prescription medicines only as told by your health care provider.  If you have sleep apnea, surgery and certain medicines can increase your risk for breathing problems. Follow instructions from your health care provider about wearing your sleep device: ? Anytime you are sleeping, including during daytime naps. ? While taking prescription pain medicines, sleeping medicines, or medicines  that make you drowsy.  If you smoke, do not smoke without supervision.  Keep all follow-up visits as told by your health care provider. This is important. Contact a health care provider if:  You keep feeling nauseous or you keep vomiting.  You feel light-headed.  You develop a rash.  You have a fever. Get help right away if:  You have trouble breathing. Summary  For several hours after your procedure, you may feel sleepy and have poor judgment.  Have a responsible adult stay with you for at least 24 hours or until you are awake and alert. This information is not intended to replace advice given to you by your health care provider. Make sure you discuss any questions you have with your health care provider. Document Revised: 02/02/2018 Document Reviewed: 02/25/2016 Elsevier Patient Education  [place].

## 2020-09-23 ENCOUNTER — Encounter: Payer: Self-pay | Admitting: Cardiology

## 2020-09-23 DIAGNOSIS — Z01810 Encounter for preprocedural cardiovascular examination: Secondary | ICD-10-CM | POA: Insufficient documentation

## 2020-09-23 NOTE — Assessment & Plan Note (Signed)
He had lost further weight with diuresis and then was doing water aerobics etc.  However the last couple months with hospitalizations and illnesses have really set him back quite a bit.  He has however lost quite a bit of weight with his illnesses and surgery. Okay to continue to keep weight off.  Needs to reset his home dry weight based on current weights.

## 2020-09-23 NOTE — Assessment & Plan Note (Addendum)
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  He should have lipids checked this fall.  I am expecting with weight loss that some of his lipids will improved.  I did not order labs because he is really having lots of procedures performed.  Perhaps he can have lipid panel added to some of his preop labs if they are fasting. . Plan: Continue rosuvastatin 40 mg daily.

## 2020-09-23 NOTE — Assessment & Plan Note (Signed)
His blood pressures have been labile in the past.  Currently they are low.  At present he is on stable dose of diltiazem and metoprolol plus torsemide.  Not on ACE inhibitor.  For now we will hold off.  Combination of beta-blocker and calcium blocker are both are protective for his upcoming procedures.

## 2020-09-23 NOTE — Assessment & Plan Note (Signed)
Truthfully, for his preoperative evaluation I think this is his most significant issue of concern.  Would probably benefit from pulmonary critical care assistance postoperative if there is difficulty with extubation.

## 2020-09-23 NOTE — Assessment & Plan Note (Signed)
Extensive mixed calcified and noncalcified coronary plaque.  I suspect that the blooming artifact probably led to overestimation of disease, however there is still extensive disease.  FFR negative.  Therefore no invasive evaluation required.  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  As such, he is on statin and beta-blocker plus calcium channel blocker. Not on aspirin because of Eliquis.  No active anginal symptoms.  I do not think we need to do any kind of ischemic evaluation.  He tolerated septic shock with hypotension and A. fib RVR without any significant troponin elevation.  He should go tolerate colonoscopy and colostomy takedown.

## 2020-09-23 NOTE — Assessment & Plan Note (Addendum)
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  Echo did not comment on diastolic dysfunction, however I doubt seriously that a 79 year old does not have diastolic heart failure.  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  Plan:  Continue combination of diltiazem XT 350 mg daily as well as metoprolol 25 mg twice daily.  Blood pressures are pretty well controlled.  He is no longer on lisinopril, this is discontinued due to hypotension and renal failure back in the hospital.  Renal function has improved, but his blood pressures look great with metoprolol in place of lisinopril.  We will hold off on restarting.  Most of his edema is probably venous stasis related, but also probably partially related to pulmonary hypertension as well.  Support stockings will be the best option, however if he can use Ace wraps that would also be beneficial until he is able to actually put the support stockings back on  He is on torsemide.  Currently seems relatively stable he is rarely taking additional dosing beyond his 20 mg twice daily.  We will closely monitor his renal function and potassium levels as he has had a history of hypokalemia.    He is on potassium supplementation.    I told him to take additional potassium supplement if he takes an additional dose of torsemide.

## 2020-09-23 NOTE — Assessment & Plan Note (Signed)
Truthfully, Vincent Cooper is a high risk patient for any procedures or surgery based on his obesity, OHS/OSA-COPD overlap as well as diastolic dysfunction.  He also has chronic lower extremity edema with cellulitis.  He is morbidly obese and has had sepsis related to atrial fibrillation.  He has significant multivessel coronary artery plaque that has been evaluated and is nonobstructive.  I think his biggest preoperative risk is probably from his pulmonary issues with COPD OHS and OSA.  From a cardiac standpoint he has diastolic dysfunction that seems to be relatively stable right now as far as volume status. ->  Could consider right heart cath/Swan-Ganz for invasive evaluation during surgery, but this would not be necessary for colonoscopy.  PREOPERATIVE CARDIAC RISK ASSESSMENT   Revised Cardiac Risk Index:  High Risk Surgery: yes; ostomy takedown-intraperitoneal surgery although it is probably superficial  Defined as Intraperitoneal, intrathoracic or suprainguinal vascular  Active CAD: no; no active angina symptoms.  Relatively recent ischemic evaluation showed nonobstructive CAD.  CHF: yes; seems euvolemic  Cerebrovascular Disease: no; not reported  Diabetes: yes; On Insulin: yes  CKD (Cr >~ 2): no; currently creatinine is back down to baseline normal 1.06  Total: 3  Estimated Risk of Adverse Outcome: Class IV risk Estimated Risk of MI, PE, VF/VT (Cardiac Arrest), Complete Heart Block: 15%-somewhat reduced but being on combination of diltiazem and beta-blocker.  Additional risks from pulmonary status.  He has had a relatively recent ischemic evaluation and echocardiogram.  Based on the fact that he tolerated emergent surgery followed by sepsis/septic shock complicated by atrial fibrillation, he should not tolerate a more stable scheduled surgery. In addition to septic shock, he subsequently tolerated COVID-19 pneumonia.  He should be fine proceeding to the OR without further cardiac  evaluation, however may want to consider invasive monitoring of his pressures for prolonged surgery.  He is on Eliquis which can be held 2 to 3 days preop for colonoscopy and ostomy takedown.

## 2020-09-23 NOTE — Assessment & Plan Note (Signed)
Seen by hematology oncology with recommendation to continue Eliquis long-term.  In light of the fact that he had A. fib in the hospital and had clearly has risk of recurrence of A. fib, this is not unreasonable.  We simply need to reevaluate the dose going forward.  Since he has upcoming procedures pending, I think it is fine to continue with a lower dose.  We will discuss with our clinical pharmacist to see if he is truly on right dose.  My suspicion is that he should be on 5 mg twice daily going forward.  Eliquis can be stopped 2 to 3 days preop for his colonoscopy and/or colostomy takedown.  Does not need to be bridged as he is now coming close to 1 year out from his PE.

## 2020-09-25 ENCOUNTER — Encounter (HOSPITAL_COMMUNITY)
Admission: RE | Admit: 2020-09-25 | Discharge: 2020-09-25 | Disposition: A | Discharge status: Home / Self Care | Payer: Medicare PPO | Source: Ambulatory Visit | Attending: Internal Medicine | Admitting: Internal Medicine

## 2020-09-25 ENCOUNTER — Encounter (HOSPITAL_COMMUNITY): Payer: Self-pay

## 2020-09-25 ENCOUNTER — Other Ambulatory Visit: Payer: Self-pay

## 2020-09-25 DIAGNOSIS — Z01812 Encounter for preprocedural laboratory examination: Secondary | ICD-10-CM | POA: Diagnosis not present

## 2020-09-25 DIAGNOSIS — Z1211 Encounter for screening for malignant neoplasm of colon: Secondary | ICD-10-CM

## 2020-09-25 DIAGNOSIS — Z20822 Contact with and (suspected) exposure to covid-19: Secondary | ICD-10-CM | POA: Diagnosis not present

## 2020-09-25 LAB — CBC WITH DIFFERENTIAL/PLATELET
Abs Immature Granulocytes: 0.02 10*3/uL (ref 0.00–0.07)
Basophils Absolute: 0.1 10*3/uL (ref 0.0–0.1)
Basophils Relative: 1 %
Eosinophils Absolute: 1.4 10*3/uL — ABNORMAL HIGH (ref 0.0–0.5)
Eosinophils Relative: 19 %
HCT: 35.6 % — ABNORMAL LOW (ref 39.0–52.0)
Hemoglobin: 11.3 g/dL — ABNORMAL LOW (ref 13.0–17.0)
Immature Granulocytes: 0 %
Lymphocytes Relative: 24 %
Lymphs Abs: 1.7 10*3/uL (ref 0.7–4.0)
MCH: 29 pg (ref 26.0–34.0)
MCHC: 31.7 g/dL (ref 30.0–36.0)
MCV: 91.5 fL (ref 80.0–100.0)
Monocytes Absolute: 0.7 10*3/uL (ref 0.1–1.0)
Monocytes Relative: 9 %
Neutro Abs: 3.5 10*3/uL (ref 1.7–7.7)
Neutrophils Relative %: 47 %
Platelets: 251 10*3/uL (ref 150–400)
RBC: 3.89 MIL/uL — ABNORMAL LOW (ref 4.22–5.81)
RDW: 18.8 % — ABNORMAL HIGH (ref 11.5–15.5)
WBC: 7.4 10*3/uL (ref 4.0–10.5)
nRBC: 0 % (ref 0.0–0.2)

## 2020-09-25 LAB — COMPREHENSIVE METABOLIC PANEL
ALT: 20 U/L (ref 0–44)
AST: 23 U/L (ref 15–41)
Albumin: 3.3 g/dL — ABNORMAL LOW (ref 3.5–5.0)
Alkaline Phosphatase: 38 U/L (ref 38–126)
Anion gap: 11 (ref 5–15)
BUN: 13 mg/dL (ref 8–23)
CO2: 24 mmol/L (ref 22–32)
Calcium: 8.8 mg/dL — ABNORMAL LOW (ref 8.9–10.3)
Chloride: 103 mmol/L (ref 98–111)
Creatinine, Ser: 1.02 mg/dL (ref 0.61–1.24)
GFR, Estimated: >60 mL/min (ref >60)
Glucose, Bld: 70 mg/dL (ref 70–99)
Potassium: 3.5 mmol/L (ref 3.5–5.1)
Sodium: 138 mmol/L (ref 135–145)
Total Bilirubin: 0.8 mg/dL (ref 0.3–1.2)
Total Protein: 6.1 g/dL — ABNORMAL LOW (ref 6.5–8.1)

## 2020-09-26 LAB — SARS CORONAVIRUS 2 (TAT 6-24 HRS): SARS Coronavirus 2: NEGATIVE

## 2020-09-27 ENCOUNTER — Ambulatory Visit (HOSPITAL_COMMUNITY): Payer: Medicare PPO | Admitting: Anesthesiology

## 2020-09-27 ENCOUNTER — Ambulatory Visit (HOSPITAL_COMMUNITY)
Admission: RE | Admit: 2020-09-27 | Discharge: 2020-09-27 | Disposition: A | Discharge status: Home / Self Care | Payer: Medicare PPO | Attending: Internal Medicine | Admitting: Internal Medicine

## 2020-09-27 ENCOUNTER — Encounter (HOSPITAL_COMMUNITY): Payer: Self-pay | Admitting: Internal Medicine

## 2020-09-27 ENCOUNTER — Encounter (HOSPITAL_COMMUNITY)
Admission: RE | Disposition: A | Discharge status: Home / Self Care | Payer: Self-pay | Source: Home / Self Care | Attending: Internal Medicine

## 2020-09-27 DIAGNOSIS — Z87891 Personal history of nicotine dependence: Secondary | ICD-10-CM | POA: Insufficient documentation

## 2020-09-27 DIAGNOSIS — D123 Benign neoplasm of transverse colon: Secondary | ICD-10-CM | POA: Insufficient documentation

## 2020-09-27 DIAGNOSIS — Z933 Colostomy status: Secondary | ICD-10-CM | POA: Insufficient documentation

## 2020-09-27 DIAGNOSIS — Z7984 Long term (current) use of oral hypoglycemic drugs: Secondary | ICD-10-CM | POA: Diagnosis not present

## 2020-09-27 DIAGNOSIS — Z79899 Other long term (current) drug therapy: Secondary | ICD-10-CM | POA: Insufficient documentation

## 2020-09-27 DIAGNOSIS — D124 Benign neoplasm of descending colon: Secondary | ICD-10-CM | POA: Diagnosis not present

## 2020-09-27 DIAGNOSIS — K644 Residual hemorrhoidal skin tags: Secondary | ICD-10-CM | POA: Diagnosis not present

## 2020-09-27 DIAGNOSIS — Z09 Encounter for follow-up examination after completed treatment for conditions other than malignant neoplasm: Secondary | ICD-10-CM | POA: Insufficient documentation

## 2020-09-27 DIAGNOSIS — Z7901 Long term (current) use of anticoagulants: Secondary | ICD-10-CM | POA: Diagnosis not present

## 2020-09-27 DIAGNOSIS — Z7951 Long term (current) use of inhaled steroids: Secondary | ICD-10-CM | POA: Diagnosis not present

## 2020-09-27 DIAGNOSIS — K5732 Diverticulitis of large intestine without perforation or abscess without bleeding: Secondary | ICD-10-CM | POA: Diagnosis not present

## 2020-09-27 DIAGNOSIS — Z1211 Encounter for screening for malignant neoplasm of colon: Secondary | ICD-10-CM

## 2020-09-27 HISTORY — PX: POLYPECTOMY: SHX5525

## 2020-09-27 HISTORY — PX: COLONOSCOPY WITH PROPOFOL: SHX5780

## 2020-09-27 LAB — GLUCOSE, CAPILLARY
Glucose-Capillary: 85 mg/dL (ref 70–99)
Glucose-Capillary: 78 mg/dL (ref 70–99)

## 2020-09-27 SURGERY — COLONOSCOPY WITH PROPOFOL
Anesthesia: General

## 2020-09-27 MED ORDER — PROPOFOL 10 MG/ML IV BOLUS
INTRAVENOUS | Status: DC | PRN
  Administered 2020-09-27: 30 mg via INTRAVENOUS
  Administered 2020-09-27: 40 mg via INTRAVENOUS
  Administered 2020-09-27: 20 mg via INTRAVENOUS
  Administered 2020-09-27: 100 ug/kg/min via INTRAVENOUS

## 2020-09-27 MED ORDER — CHLORHEXIDINE GLUCONATE CLOTH 2 % EX PADS: 6.0000 | MEDICATED_PAD | Freq: Once | CUTANEOUS | Status: DC

## 2020-09-27 MED ORDER — STERILE WATER FOR IRRIGATION IR SOLN
Status: DC | PRN
  Administered 2020-09-27: 100 mL

## 2020-09-27 MED ORDER — LACTATED RINGERS IV SOLN: INTRAVENOUS | Status: DC | PRN

## 2020-09-27 MED ORDER — LACTATED RINGERS IV SOLN: Freq: Once | INTRAVENOUS | Status: AC

## 2020-09-27 NOTE — Transfer of Care (Signed)
Immediate Anesthesia Transfer of Care Note  Patient: Eliu Lal  Procedure(s) Performed: COLONOSCOPY WITH PROPOFOL (N/A ) POLYPECTOMY  Patient Location: PACU  Anesthesia Type:General  Level of Consciousness: awake, alert , oriented and patient cooperative  Airway & Oxygen Therapy: Patient Spontanous Breathing and Patient connected to nasal cannula oxygen  Post-op Assessment: Report given to RN, Post -op Vital signs reviewed and stable and Patient moving all extremities  Post vital signs: Reviewed and stable  Last Vitals:  Vitals Value Taken Time  BP    Temp    Pulse 55 09/27/20 1503  Resp 19 09/27/20 1503  SpO2 90 % 09/27/20 1503  Vitals shown include unvalidated device data.  Last Pain:  Vitals:   09/27/20 1439  TempSrc:   PainSc: 0-No pain      Patients Stated Pain Goal: 5 (42/39/53 2023)  Complications: No complications documented.

## 2020-09-27 NOTE — Anesthesia Preprocedure Evaluation (Signed)
Anesthesia Evaluation  Patient identified by MRN, date of birth, ID band Patient awake    Reviewed: Allergy & Precautions, NPO status , Patient's Chart, lab work & pertinent test results  Airway Mallampati: II  TM Distance: >3 FB Neck ROM: Full    Dental  (+) Edentulous Upper, Edentulous Lower   Pulmonary shortness of breath and with exertion, sleep apnea , COPD, former smoker, PE   Pulmonary exam normal breath sounds clear to auscultation       Cardiovascular Exercise Tolerance: Poor hypertension, Pt. on medications + CAD  Normal cardiovascular exam+ Valvular Problems/Murmurs  Rhythm:Regular Rate:Normal  1. Left ventricular ejection fraction, by visual estimation, is 60 to  65%. The left ventricle has normal function. There is no left ventricular  hypertrophy.  2. Left ventricular diastolic parameters are indeterminate.  3. The left ventricle has no regional wall motion abnormalities.  4. Global right ventricle has normal systolic function.The right  ventricular size is moderately enlarged. Right vetricular wall thickness  was not assessed.  5. Left atrial size was mildly dilated.  6. Right atrial size was normal.  7. Small pericardial effusion.  8. The mitral valve is normal in structure. No evidence of mitral valve  regurgitation.  9. The tricuspid valve is normal in structure. Tricuspid valve  regurgitation is mild.  10. The aortic valve has an indeterminant number of cusps. Aortic valve  regurgitation is not visualized. Mild aortic valve sclerosis without  stenosis.  11. The pulmonic valve was not well visualized. Pulmonic valve  regurgitation is trivial.  12. Moderately elevated pulmonary artery systolic pressure.  13. The tricuspid regurgitant velocity is 2.84 m/s, and with an assumed  right atrial pressure of 8 mmHg, the estimated right ventricular systolic  pressure is moderately elevated at 40.3 mmHg.     Neuro/Psych negative neurological ROS  negative psych ROS   GI/Hepatic   Endo/Other  diabetes  Renal/GU Renal InsufficiencyRenal disease     Musculoskeletal   Abdominal   Peds  Hematology   Anesthesia Other Findings   Reproductive/Obstetrics                             Anesthesia Physical Anesthesia Plan  ASA: IV  Anesthesia Plan: General   Post-op Pain Management:    Induction: Intravenous  PONV Risk Score and Plan:   Airway Management Planned: Nasal Cannula, Natural Airway and Simple Face Mask  Additional Equipment:   Intra-op Plan:   Post-operative Plan:   Informed Consent: I have reviewed the patients History and Physical, chart, labs and discussed the procedure including the risks, benefits and alternatives for the proposed anesthesia with the patient or authorized representative who has indicated his/her understanding and acceptance.     Dental advisory given  Plan Discussed with: CRNA and Surgeon  Anesthesia Plan Comments:         Anesthesia Quick Evaluation

## 2020-09-27 NOTE — Anesthesia Postprocedure Evaluation (Signed)
Anesthesia Post Note  Patient: Vincent Cooper  Procedure(s) Performed: COLONOSCOPY WITH PROPOFOL (N/A ) POLYPECTOMY  Patient location during evaluation: PACU Anesthesia Type: General Level of consciousness: awake, oriented, awake and alert and patient cooperative Pain management: pain level controlled Vital Signs Assessment: post-procedure vital signs reviewed and stable Respiratory status: spontaneous breathing, respiratory function stable, nonlabored ventilation and patient connected to nasal cannula oxygen Cardiovascular status: blood pressure returned to baseline and stable Postop Assessment: no headache and no backache Anesthetic complications: no   No complications documented.   Last Vitals:  Vitals:   09/27/20 1214  BP: (!) 119/54  Pulse: (!) 58  Resp: 18  Temp: (!) 36.3 C  SpO2: 95%    Last Pain:  Vitals:   09/27/20 1439  TempSrc:   PainSc: 0-No pain                 Tacy Learn

## 2020-09-27 NOTE — Op Note (Signed)
[facility] Patient Name: Vincent Cooper Procedure Date: 09/27/2020 1:56 PM MRN: [id number] Date of Birth: 08/28/1941 Attending MD: Hildred Laser , MD CSN: [id number] Age: 79 Admit Type: Outpatient Procedure:                Colonoscopy via colostomy and examination of                            rectosigmoid stump. Indications:              Follow-up of diverticulitis; Providers:                Hildred Laser, MD, Lambert Mody, Charlsie Quest                            Insurance claims handler, Therapist, sports, Suzan Garibaldi. Risa Grill, Technician,                            Aram Candela Referring MD:              Medicines:                Propofol per Anesthesia Complications:            No immediate complications. Estimated Blood Loss:     Estimated blood loss was minimal. Procedure:                Pre-Anesthesia Assessment:                           - Prior to the procedure, a History and Physical                            was performed, and patient medications and                            allergies were reviewed. The patient's tolerance of                            previous anesthesia was also reviewed. The risks                            and benefits of the procedure and the sedation                            options and risks were discussed with the patient.                            All questions were answered, and informed consent                            was obtained. Prior Anticoagulants: The patient has                            taken no previous anticoagulant or antiplatelet                            agents  except for NSAID medication. ASA Grade                            Assessment: IV - A patient with severe systemic                            disease. After reviewing the risks and benefits,                            the patient was deemed in satisfactory condition to                            undergo the procedure.                           After obtaining informed consent, the  colonoscope                            was passed under direct vision. Throughout the                            procedure, the patient's blood pressure, pulse, and                            oxygen saturations were monitored continuously. The                            [id number]-[id number] ([id number]) scope was introduced through                            the sigmoid colostomy and advanced to the the                            cecum, identified by appendiceal orifice and                            ileocecal valve. The colonoscopy was performed                            without difficulty. The patient tolerated the                            procedure well. The quality of the bowel                            preparation was adequate. The ileocecal valve,                            appendiceal orifice, and rectum were photographed. Scope In: 2:48:10 PM Scope Out: 2:59:40 PM Scope Withdrawal Time: 0 hours 10 minutes 34 seconds  Total Procedure Duration: 0 hours 11 minutes 30 seconds  Findings:      Two polyps were found in the descending colon and hepatic flexure. The       polyps were small in size. These polyps  were removed with a cold snare.       Resection and retrieval were complete. The pathology specimen was placed       into Bottle Number 1.      The exam was otherwise normal throughout the examined colon.      The perianal and digital rectal examinations were normal.      The rectum and distal sigmoid colon appeared normal.      External hemorrhoids were found during retroflexion. The hemorrhoids       were small. 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                           - Two small polyps in the descending colon and at                            the hepatic flexure, removed with a cold snare.                            Resected and retrieved.                           - The rectum and distal sigmoid colon are normal.                           -  External hemorrhoids. Moderate Sedation:      Per Anesthesia Care Recommendation:           - Patient has a contact number available for                            emergencies. The signs and symptoms of potential                            delayed complications were discussed with the                            patient. Return to normal activities tomorrow.                            Written discharge instructions were provided to the                            patient.                           - Resume previous diet today.                           - Continue present medications.                           - No aspirin, ibuprofen, naproxen, or other                            non-steroidal anti-inflammatory drugs for 1 day.                           -  Await pathology results.                           - Repeat colonoscopy is recommended. The                            colonoscopy date will be determined after pathology                            results from today's exam become available for                            review. Procedure Code(s):        --- Professional ---                           [phone], Colonoscopy through stoma; with removal of                            tumor(s), polyp(s), or other lesion(s) by snare                            technique Diagnosis Code(s):        --- Professional ---                           K63.5, Polyp of colon                           K64.4, Residual hemorrhoidal skin tags                           K57.32, Diverticulitis of large intestine without                            perforation or abscess without bleeding CPT copyright 2019 American Medical Association. All rights reserved. The codes documented in this report are preliminary and upon coder review may  be revised to meet current compliance requirements. Hildred Laser, MD Hildred Laser, MD 09/27/2020 3:08:42 PM This report has been signed electronically. Number of Addenda: 0

## 2020-09-27 NOTE — Discharge Instructions (Signed)
No aspirin or NSAIDs for 24 hours. Resume other medications and diet as before. No driving for 24 hours.   Physician will call with biopsy results.                    Colonoscopy, Adult, Care After This sheet gives you information about how to care for yourself after your procedure. Your doctor may also give you more specific instructions. If you have problems or questions, call your doctor. What can I expect after the procedure? After the procedure, it is common to have:  A small amount of blood in your poop (stool) for 24 hours.  Some gas.  Mild cramping or bloating in your belly (abdomen). Follow these instructions at home: Eating and drinking   Drink enough fluid to keep your pee (urine) pale yellow.  Follow instructions from your doctor about what you cannot eat or drink.  Return to your normal diet as told by your doctor. Avoid heavy or fried foods that are hard to digest. Activity  Rest as told by your doctor.  Do not sit for a long time without moving. Get up to take short walks every 1-2 hours. This is important. Ask for help if you feel weak or unsteady.  Return to your normal activities as told by your doctor. Ask your doctor what activities are safe for you. To help cramping and bloating:   Try walking around.  Put heat on your belly as told by your doctor. Use the heat source that your doctor recommends, such as a moist heat pack or a heating pad. ? Put a towel between your skin and the heat source. ? Leave the heat on for 20-30 minutes. ? Remove the heat if your skin turns bright red. This is very important if you are unable to feel pain, heat, or cold. You may have a greater risk of getting burned. General instructions  For the first 24 hours after the procedure: ? Do not drive or use machinery. ? Do not sign important documents. ? Do not drink alcohol. ? Do your daily activities more slowly than normal. ? Eat foods that are soft and easy to  digest.  Take over-the-counter or prescription medicines only as told by your doctor.  Keep all follow-up visits as told by your doctor. This is important. Contact a doctor if:  You have blood in your poop 2-3 days after the procedure. Get help right away if:  You have more than a small amount of blood in your poop.  You see large clumps of tissue (blood clots) in your poop.  Your belly is swollen.  You feel like you may vomit (nauseous).  You vomit.  You have a fever.  You have belly pain that gets worse, and medicine does not help your pain. Summary  After the procedure, it is common to have a small amount of blood in your poop. You may also have mild cramping and bloating in your belly.  For the first 24 hours after the procedure, do not drive or use machinery, do not sign important documents, and do not drink alcohol.  Get help right away if you have a lot of blood in your poop, feel like you may vomit, have a fever, or have more belly pain. This information is not intended to replace advice given to you by your health care provider. Make sure you discuss any questions you have with your health care provider. Document Revised: 05/31/2019 Document Reviewed: 05/31/2019 Elsevier  Patient Education  [employer].          Colon Polyps  Polyps are tissue growths inside the body. Polyps can grow in many places, including the large intestine (colon). A polyp may be a round bump or a mushroom-shaped growth. You could have one polyp or several. Most colon polyps are noncancerous (benign). However, some colon polyps can become cancerous over time. Finding and removing the polyps early can help prevent this. What are the causes? The exact cause of colon polyps is not known. What increases the risk? You are more likely to develop this condition if you:  Have a family history of colon cancer or colon polyps.  Are older than 89 or older than 45 if you are African  American.  Have inflammatory bowel disease, such as ulcerative colitis or Crohn's disease.  Have certain hereditary conditions, such as: ? Familial adenomatous polyposis. ? Lynch syndrome. ? Turcot syndrome. ? Peutz-Jeghers syndrome.  Are overweight.  Smoke cigarettes.  Do not get enough exercise.  Drink too much alcohol.  Eat a diet that is high in fat and red meat and low in fiber.  Had childhood cancer that was treated with abdominal radiation. What are the signs or symptoms? Most polyps do not cause symptoms. If you have symptoms, they may include:  Blood coming from your rectum when having a bowel movement.  Blood in your stool. The stool may look dark red or black.  Abdominal pain.  A change in bowel habits, such as constipation or diarrhea. How is this diagnosed? This condition is diagnosed with a colonoscopy. This is a procedure in which a lighted, flexible scope is inserted into the anus and then passed into the colon to examine the area. Polyps are sometimes found when a colonoscopy is done as part of routine cancer screening tests. How is this treated? Treatment for this condition involves removing any polyps that are found. Most polyps can be removed during a colonoscopy. Those polyps will then be tested for cancer. Additional treatment may be needed depending on the results of testing. Follow these instructions at home: Lifestyle  Maintain a healthy weight, or lose weight if recommended by your health care provider.  Exercise every day or as told by your health care provider.  Do not use any products that contain nicotine or tobacco, such as cigarettes and e-cigarettes. If you need help quitting, ask your health care provider.  If you drink alcohol, limit how much you have: ? 0-1 drink a day for women. ? 0-2 drinks a day for men.  Be aware of how much alcohol is in your drink. In the [place] one drink equals one 12 oz bottle of beer (355 mL), one 5 oz glass  of wine (148 mL), or one 1 oz shot of hard liquor (44 mL). Eating and drinking   Eat foods that are high in fiber, such as fruits, vegetables, and whole grains.  Eat foods that are high in calcium and vitamin D, such as milk, cheese, yogurt, eggs, liver, fish, and broccoli.  Limit foods that are high in fat, such as fried foods and desserts.  Limit the amount of red meat and processed meat you eat, such as hot dogs, sausage, bacon, and lunch meats. General instructions  Keep all follow-up visits as told by your health care provider. This is important. ? This includes having regularly scheduled colonoscopies. ? Talk to your health care provider about when you need a colonoscopy. Contact a health  care provider if:  You have new or worsening bleeding during a bowel movement.  You have new or increased blood in your stool.  You have a change in bowel habits.  You lose weight for no known reason. Summary  Polyps are tissue growths inside the body. Polyps can grow in many places, including the colon.  Most colon polyps are noncancerous (benign), but some can become cancerous over time.  This condition is diagnosed with a colonoscopy.  Treatment for this condition involves removing any polyps that are found. Most polyps can be removed during a colonoscopy. This information is not intended to replace advice given to you by your health care provider. Make sure you discuss any questions you have with your health care provider. Document Revised: 02/19/2018 Document Reviewed: 02/19/2018 Elsevier Patient Education  [employer] After These instructions provide you with information about caring for yourself after your procedure. Your health care provider may also give you more specific instructions. Your treatment has been planned according to current medical practices, but problems sometimes occur. Call your health care provider if you have any  problems or questions after your procedure. What can I expect after the procedure? After your procedure, you may:  Feel sleepy for several hours.  Feel clumsy and have poor balance for several hours.  Feel forgetful about what happened after the procedure.  Have poor judgment for several hours.  Feel nauseous or vomit.  Have a sore throat if you had a breathing tube during the procedure. Follow these instructions at home: For at least 24 hours after the procedure:      Have a responsible adult stay with you. It is important to have someone help care for you until you are awake and alert.  Rest as needed.  Do not: ? Participate in activities in which you could fall or become injured. ? Drive. ? Use heavy machinery. ? Drink alcohol. ? Take sleeping pills or medicines that cause drowsiness. ? Make important decisions or sign legal documents. ? Take care of children on your own. Eating and drinking  Follow the diet that is recommended by your health care provider.  If you vomit, drink water, juice, or soup when you can drink without vomiting.  Make sure you have little or no nausea before eating solid foods. General instructions  Take over-the-counter and prescription medicines only as told by your health care provider.  If you have sleep apnea, surgery and certain medicines can increase your risk for breathing problems. Follow instructions from your health care provider about wearing your sleep device: ? Anytime you are sleeping, including during daytime naps. ? While taking prescription pain medicines, sleeping medicines, or medicines that make you drowsy.  If you smoke, do not smoke without supervision.  Keep all follow-up visits as told by your health care provider. This is important. Contact a health care provider if:  You keep feeling nauseous or you keep vomiting.  You feel light-headed.  You develop a rash.  You have a fever. Get help right away  if:  You have trouble breathing. Summary  For several hours after your procedure, you may feel sleepy and have poor judgment.  Have a responsible adult stay with you for at least 24 hours or until you are awake and alert. This information is not intended to replace advice given to you by your health care provider. Make sure you discuss any questions you have with your health care provider.  Document Revised: 02/02/2018 Document Reviewed: 02/25/2016 Elsevier Patient Education  [employer].

## 2020-09-27 NOTE — H&P (Signed)
Vincent Cooper is an 79 y.o. male.   Chief Complaint: She is here for colonoscopy. HPI: Patient is 79 year old Caucasian male who underwent exploratory laparotomy with sigmoid colon resection and colostomy for complicated diverticulitis.  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 Constance Haw.  He is ready to have his colostomy reversed.  Patient is undergoing diagnostic colonoscopy.  He has not experienced any bleeding into colostomy.  He denies rectal discharge.  He says last colonoscopy was about 10 years ago. Family history is negative for CRC.  Past Medical History:  Diagnosis Date   Chronic gout    COPD (chronic obstructive pulmonary disease) ([place])    Oxygen dependent   Coronary artery disease due to calcified coronary lesion 03/08/2020   CORONARY CT ANGIOGRAM:  Cor Ca2+ Score 1651. CAD-RADS 4 - Severe.  ? >50% LM stenosis -->CTFFR -- NOT PHYSIOLOGICALLY SIGNIFICANT; Large Dom RCA-<PDA/PLA.  Diffuse mild to mod plaque (25-49%) prox segment and mod (50-69%) mid-distal;  CTFFR: prox 0.95, mid 0.88, distal 0.86 (not significant); Med-sized LAD w/ long diffuse mod-severe plaque calcified plaque in prox-mid ~ 50 -69%, ? >70% -> CTFFR :    Diabetes mellitus type II, non insulin dependent (HCC)    Hypertension    Morbid obesity (HCC)    BMI of 38.5 with multiple risk factors.   OSA on CPAP    Pulmonary emboli ([place]) 10/2019   Chest CTA-4 [place] opacification of main PA but there is partially occlusive main posterior RLL and segmental/segmental branches.  Small thrombus noted in the anterior right middle lobe.  No RV strain.  Scattered aortic atherosclerosis involving great vessels.  Coronary calcification noted.   RLS (restless legs syndrome)     Past Surgical History:  Procedure Laterality Date   Cataract surgery Right    LAPAROTOMY N/A 06/21/2020   Procedure: EXPLORATORY LAPAROTOMY,  bowel resection, creation ostomy;  Surgeon: Virl Cagey, MD;   Location: [facility];  Service: General;  Laterality: N/A;    Family History  Problem Relation Age of Onset   Colon cancer Mother    Emphysema Father    Diverticulitis Sister    Diabetes Brother    Heart Problems Maternal Grandmother    Diabetes Brother    Lupus Son    Breast cancer Daughter    CAD Neg Hx    Social History:  reports that he has quit smoking. He has quit using smokeless tobacco. He reports previous alcohol use. He reports that he does not use drugs.  Allergies:  Allergies  Allergen Reactions   Other     Patient reports he was allergic to something in an IV he was given but does not know what the substance was. As of 01/19/2020     Medications Prior to Admission  Medication Sig Dispense Refill   albuterol (VENTOLIN HFA) 108 (90 Base) MCG/ACT inhaler Inhale 1 puff into the lungs every 6 (six) hours as needed for wheezing or shortness of breath.     apixaban (ELIQUIS) 2.5 MG TABS tablet Take 1 tablet (2.5 mg total) by mouth 2 (two) times daily. 60 tablet 3   diltiazem (TIAZAC) 360 MG 24 hr capsule Take 360 mg by mouth daily.     glimepiride (AMARYL) 2 MG tablet Take 2 mg by mouth daily with breakfast.     guaiFENesin (MUCINEX) 600 MG 12 hr tablet Take 1 tablet (600 mg total) by mouth 2 (two) times daily. 30 tablet 0   ibuprofen (  ADVIL) 200 MG tablet Take 400 mg by mouth every 6 (six) hours as needed for moderate pain.     metoprolol tartrate (LOPRESSOR) 25 MG tablet Take 1 tablet (25 mg total) by mouth 2 (two) times daily. 60 tablet 3   nitroGLYCERIN (NITROSTAT) 0.4 MG SL tablet Place 1 tablet (0.4 mg total) under the tongue every 5 (five) minutes as needed for chest pain. 90 tablet 3   potassium chloride SA (KLOR-CON) 20 MEQ tablet Take 20 mEq by mouth daily.     rosuvastatin (CRESTOR) 40 MG tablet Take 1 tablet (40 mg total) by mouth daily. (Patient taking differently: Take 40 mg by mouth at bedtime. ) 90 tablet 3   tamsulosin (FLOMAX) 0.4 MG CAPS  capsule Take 1 capsule (0.4 mg total) by mouth daily. (Patient taking differently: Take 0.4 mg by mouth at bedtime. ) 30 capsule 1   torsemide (DEMADEX) 20 MG tablet Take 20 mg by mouth 2 (two) times daily.     TRELEGY ELLIPTA 200-62.5-25 MCG/INH AEPB Inhale 1 puff into the lungs daily.      insulin detemir (LEVEMIR) 100 UNIT/ML injection Inject 0.07 mLs (7 Units total) into the skin 2 (two) times daily. (Patient not taking: Reported on 09/22/2020) 10 mL 11   ipratropium-albuterol (DUONEB) 0.5-2.5 (3) MG/3ML SOLN Take 3 mLs by nebulization every 6 (six) hours as needed (shortness of breath).      senna-docusate (SENOKOT-S) 8.6-50 MG tablet Take 2 tablets by mouth at bedtime. 60 tablet 1   triamcinolone cream (KENALOG) 0.1 % Apply 1 application topically 2 (two) times daily.       Results for orders placed or performed during the hospital encounter of 09/27/20 (from the past 48 hour(s))  Glucose, capillary     Status: None   Collection Time: 09/27/20 12:02 PM  Result Value Ref Range   Glucose-Capillary 85 70 - 99 mg/dL    Comment: Glucose reference range applies only to samples taken after fasting for at least 8 hours.   No results found.  Review of Systems  Blood pressure (!) 119/54, pulse (!) 58, temperature (!) 97.4 F (36.3 C), temperature source Oral, resp. rate 18, SpO2 95 %. Physical Exam HENT:     Mouth/Throat:     Mouth: Mucous membranes are moist.     Pharynx: Oropharynx is clear.  Eyes:     Conjunctiva/sclera: Conjunctivae normal.  Cardiovascular:     Rate and Rhythm: Normal rate and regular rhythm.     Heart sounds: Normal heart sounds. No murmur heard.   Pulmonary:     Effort: Pulmonary effort is normal.     Breath sounds: Normal breath sounds.  Abdominal:     Comments: Abdomen is full.  He has upper midline scar with scab over the lower part which is almost completely healed.  He has colostomy left lower quadrant.  Abdomen is soft and nontender with organomegaly  or masses.  Musculoskeletal:        General: Swelling present.     Cervical back: Neck supple. No rigidity.     Comments: He has 1-2+ pitting edema involving both legs with some stasis dermatitis but no open areas.  Neurological:     Mental Status: He is alert.      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 Diagnostic colonoscopy along with examination of rectal stump.  Hildred Laser, MD 09/27/2020, 2:41 PM

## 2020-09-29 LAB — SURGICAL PATHOLOGY

## 2020-10-02 ENCOUNTER — Encounter (HOSPITAL_COMMUNITY): Payer: Self-pay | Admitting: Internal Medicine

## 2020-10-05 ENCOUNTER — Other Ambulatory Visit: Payer: Self-pay

## 2020-10-05 ENCOUNTER — Ambulatory Visit (INDEPENDENT_AMBULATORY_CARE_PROVIDER_SITE_OTHER): Payer: Medicare PPO | Admitting: General Surgery

## 2020-10-05 ENCOUNTER — Encounter: Payer: Self-pay | Admitting: General Surgery

## 2020-10-05 VITALS — BP 125/61 | HR 72 | Temp 97.8°F | Resp 18 | Ht 69.0 in | Wt 239.0 lb

## 2020-10-05 DIAGNOSIS — Z933 Colostomy status: Secondary | ICD-10-CM

## 2020-10-05 DIAGNOSIS — K9403 Colostomy malfunction: Secondary | ICD-10-CM | POA: Diagnosis not present

## 2020-10-05 NOTE — Progress Notes (Signed)
[facility] Note   HPI:  79 y.o. Male presents to clinic for follow up evaluation. He had his colonoscopy and all things look good. He is not having issues. He is ready for reversal. Plan for 10/18/2020.  He has no more wafers at home and his supplier says he cannot get anymore until the end of the month.   Review of Systems:  Continued stool from ostomy All other review of systems: otherwise negative   Vital Signs:  BP 125/61   Pulse 72   Temp 97.8 F (36.6 C) (Oral)   Resp 18   Ht 5\' 9"  (1.753 m)   Wt 239 lb (108.4 kg)   SpO2 95%   BMI 35.29 kg/m    Physical Exam:  Physical Exam Cardiovascular:     Rate and Rhythm: Normal rate.  Pulmonary:     Effort: Pulmonary effort is normal.  Abdominal:     General: There is no distension.     Palpations: Abdomen is soft.     Tenderness: There is no abdominal tenderness.     Comments: Stenotic ostomy, digitally dilated, minor bleeding  Neurological:     Mental Status: He is alert.     Assessment:  79 y.o. yo Male with a stenotic ostomy that needs to be dilated weekly until reversal. He has issues with leakage and his wafers needing to be changed due to the stenosis which is no fault of his own and needs more wafers. We will reach out to his supplier to get him some more wafers. I gave him 3 today.  Plan:  - Return next week for dilation - Will give bowel prep information then  - Surgery for reversal 10/18/2020    Future Appointments  Date Time Provider [place]  10/10/2020 10:00 AM Virl Cagey, MD [facility] None  10/16/2020  9:15 AM [facility] PAT 2 [facility] None  10/16/2020  9:30 AM [facility] PAT 2 [facility] None  01/30/2021  3:50 PM [facility] LAB [facility] None  02/06/2021  4:00 PM Derek Jack, MD [facility] None      [name], MD [facility] [address] [place], [place] [address] [phone] (office)

## 2020-10-05 NOTE — Patient Instructions (Signed)
Continue diet and activity as tolerated. Keep stools soft.

## 2020-10-09 ENCOUNTER — Other Ambulatory Visit (HOSPITAL_COMMUNITY): Payer: Medicare PPO

## 2020-10-09 ENCOUNTER — Encounter (HOSPITAL_COMMUNITY): Payer: Medicare PPO

## 2020-10-10 ENCOUNTER — Ambulatory Visit (INDEPENDENT_AMBULATORY_CARE_PROVIDER_SITE_OTHER): Payer: Medicare PPO | Admitting: General Surgery

## 2020-10-10 ENCOUNTER — Other Ambulatory Visit (HOSPITAL_COMMUNITY): Payer: Medicare PPO

## 2020-10-10 ENCOUNTER — Other Ambulatory Visit: Payer: Self-pay

## 2020-10-10 ENCOUNTER — Encounter: Payer: Self-pay | Admitting: General Surgery

## 2020-10-10 VITALS — BP 129/69 | HR 64 | Temp 97.6°F | Resp 16 | Ht 69.0 in | Wt 238.0 lb

## 2020-10-10 DIAGNOSIS — K9403 Colostomy malfunction: Secondary | ICD-10-CM | POA: Diagnosis not present

## 2020-10-10 DIAGNOSIS — Z933 Colostomy status: Secondary | ICD-10-CM

## 2020-10-10 MED ORDER — NEOMYCIN SULFATE 500 MG PO TABS: 1000.0000 mg | ORAL_TABLET | ORAL | 0 refills | Status: DC

## 2020-10-10 MED ORDER — METRONIDAZOLE 500 MG PO TABS: 1000.0000 mg | ORAL_TABLET | ORAL | 0 refills | Status: DC

## 2020-10-10 MED ORDER — DULCOLAX 5 MG PO TBEC: 20.0000 mg | DELAYED_RELEASE_TABLET | Freq: Once | ORAL | 0 refills | Status: AC

## 2020-10-10 NOTE — Patient Instructions (Addendum)
DO NOT TAKE YOUR ELIQUIS STARTING Monday 10/16/2020 until after restarted after surgery.   Buy from the Store: Miralax bottle [phone]).  Gatorade 64 oz (not red). Dulcolax tablets.  Fleets Enema.   The Day Prior to Surgery: Take 4 ducolax tablets at 7am with water. Do an enema through your rectum at 8AM. Drink plenty of clear liquids all day to avoid dehydration, no solid food.    Mix the bottle of Miralax and 64 oz of Gatorade and drink this mixture starting at 10am.  Drink it gradually over the next few hours, 8 ounces every 15-30 minutes until it is gone. Finish this by 2pm.  Repeat an enema at 2pm.  Take 2 neomycin 500mg  tablets and 2 metronidazole 500mg  tablets at 2 pm. Take 2 neomycin 500mg  tablets and 2 metronidazole 500mg  tablets at 3pm. Take 2 neomycin 500mg  tablets and 2 metronidazole 500mg  tablets at 10pm.    Do not eat or drink anything after midnight the night before your surgery.  Do not eat or drink anything that morning, and take medications as instructed by the hospital staff on your preoperative visit.    Colostomy Reversal Surgery A colostomy reversal is a surgical procedure that is done to reverse a colostomy. In this reversal procedure, the large intestine is disconnected from the opening in the abdomen (stoma). Then, the changes that were made to the intestine during the colostomy will be reversed to restore the flow of stool through the entire intestine. Depending on the type of colostomy being reversed, this may involve one of the following:  Reconnecting the two ends of the intestine that were separated during colostomy surgery.  Closing the opening that was made in the side of the intestine to allow stool to be redirected through the stoma. After this surgery, a stoma and colostomy bag are no longer needed. Stool (feces) can leave your body through the rectum, as it did before you had a colostomy. Tell a health care provider about:  Any allergies you  have.  All medicines you are taking, including vitamins, herbs, eye drops, creams, and over-the-counter medicines.  Any problems you or family members have had with anesthetic medicines.  Any blood disorders you have.  Any surgeries you have had.  Any medical conditions you have.  Whether you are pregnant or may be pregnant. What are the risks? Generally, this is a safe procedure. However, problems may occur, including:  Infection.  Bleeding.  Allergic reactions to medicines.  Damage to other structures or organs.  A temporary condition in which the intestines stop moving and working correctly (ileus). This usually goes away in 3-7 days.  A collection of pus (abscess) in the abdomen or pelvis.  Intestinal blockage.  Leaking at the area of the intestine where it was reconnected (anastomotic leak) or where the opening of the stoma was closed.  Narrowing of the intestine (stricture) at the place where it was reconnected.  Urinary and sexual dysfunction. What happens before the procedure? Medicines Ask your health care provider about:  Changing or stopping your regular medicines. This is especially important if you are taking diabetes medicines or blood thinners.  Taking medicines such as aspirin and ibuprofen. These medicines can thin your blood. Do not take these medicines unless your health care provider tells you to take them.  Taking over-the-counter medicines, vitamins, herbs, and supplements. General instructions  You may have an exam or testing.  Plan to have someone take you home after the procedure.  Plan to  have a responsible adult care for you for at least 24 hours after you leave the hospital or clinic. This is important.  Do not use any products that contain nicotine or tobacco, such as cigarettes, e-cigarettes, and chewing tobacco. These can delay incision healing after surgery. If you need help quitting, ask your health care provider.  Ask your health  care provider what steps will be taken to help prevent infection. These may include: ? Removing hair at the surgery site. ? Washing skin with a germ-killing soap. ? Antibiotic medicine. What happens during the procedure?   An IV will be inserted into one of your veins.  You may be given: ? A medicine to help you relax (sedative). ? A medicine to make you fall asleep (general anesthetic).  An incision will be made in your abdomen at the site of the stoma.  The large intestine will be disconnected from the abdomen at the site of the stoma.  The next steps will vary depending on the type of colostomy reversal surgery you are having. There are two main types: ? End colostomy reversal. The surgeon will use stitches (sutures) or staples to reconnect the two ends of the intestine that were separated during the end colostomy. ? Loop colostomy reversal. The surgeon will use sutures or staples to close the opening in the intestine that had been allowing stool to be redirected through the stoma. The intestine will then be put back into its normal position inside the abdomen.  The incision will be closed with sutures, skin glue, or adhesive strips. It may be covered with bandages (dressings). The procedure may vary among health care providers and hospitals. What happens after the procedure?  Your blood pressure, heart rate, breathing rate, and blood oxygen level will be monitored until you leave the hospital or clinic.  You will be given pain medicine as needed.  You will slowly increase your diet and movement as told by your health care provider. Summary  A colostomy reversal is a surgical procedure that is done to reverse a colostomy. After this surgery, stool (feces) can leave your body through the rectum, as it did before you had a colostomy.  Before the procedure, follow instructions from your health care provider about taking medicines and about eating and drinking.  During the  procedure, your colostomy will be reversed, and the incision will be closed with sutures, skin glue, or adhesive strips. It may be covered with bandages (dressings).  After the procedure, you will slowly increase your diet and movement as told by your health care provider. This information is not intended to replace advice given to you by your health care provider. Make sure you discuss any questions you have with your health care provider. Document Revised: 04/22/2018 Document Reviewed: 04/22/2018 Elsevier Patient Education  [place].

## 2020-10-10 NOTE — Patient Instructions (Signed)
Vincent Cooper  10/10/2020     @PREFPERIOPPHARMACY @   Your procedure is scheduled on  10/18/2020.  Report to [name] at  [phone]  A.M.  Call this number if you have problems the morning of surgery:  [phone]   Remember:  Follow the enclosed diet and prep instructions.                      Take these medicines the morning of surgery with A SIP OF WATER  Diltiazem, metoprolol. Your last dose of eliquis should be 10/15/2020. Use your nebulizer and your inhaler before you come and bring your rescue inhaler with you.    Do not wear jewelry, make-up or nail polish.  Do not wear lotions, powders, or perfumes. Please wear deodorant and brush your teeth.  Do not shave 48 hours prior to surgery.  Men may shave face and neck.  Do not bring valuables to the hospital.  [facility] is not responsible for any belongings or valuables.  Contacts, dentures or bridgework may not be worn into surgery.  Leave your suitcase in the car.  After surgery it may be brought to your room.  For patients admitted to the hospital, discharge time will be determined by your treatment team.  Patients discharged the day of surgery will not be allowed to drive home.   Name and phone number of your driver:   family Special instructions:  DO NOT smoke the morning of your procedure.  Please read over the following fact sheets that you were given. Anesthesia Post-op Instructions and Care and Recovery After Surgery       Colostomy Reversal Surgery, Care After This sheet gives you information about how to care for yourself after your procedure. Your health care provider may also give you more specific instructions. If you have problems or questions, contact your health care provider. What can I expect after the procedure? After the procedure, it is common to have:  Pain and discomfort in your abdomen, especially near your incision.  Loose stools (diarrhea).  Decreased  appetite.  Constipation. Follow these instructions at home: Activity   Do not lift anything that is heavier than 10 lb (4.5 kg), or the limit that you are told, until your health care provider says that it is safe.  Return to your normal activities as told by your health care provider. Ask your health care provider what activities are safe for you.  Avoid sitting for a long time without moving. Get up to take short walks every 1-2 hours. This is important to improve blood flow and breathing. Ask for help if you feel weak or unsteady.  Avoid strenuous activity, contact sports, and abdominal exercises for 4 weeks or as long as told by your health care provider. Incision care   Follow instructions from your health care provider about how to take care of your incision. Make sure you: ? Wash your hands with soap and water before and after you change your bandage (dressing). If soap and water are not available, use hand sanitizer. ? Change your dressing as told by your health care provider. ? Leave stitches (sutures), skin glue, or adhesive strips in place. These skin closures may need to stay in place for 2 weeks or longer. If adhesive strip edges start to loosen and curl up, you may trim the loose edges. Do not remove adhesive strips completely unless your health care provider tells you to  do that.  Keep the incision area clean and dry.  Check your incision area every day for signs of infection. Check for: ? More redness, swelling, or pain. ? More fluid or blood. ? Warmth. ? Pus or a bad smell. Bathing  Do not take baths, swim, or use a hot tub until your health care provider approves. Ask your health care provider if you may take showers. You may only be allowed to take sponge baths.  If your health care provider approves bathing and showering, cover the dressing with a watertight covering to protect it from water. Do not let the dressing get wet.  Keep the dressing dry until your  health care provider says it can be removed. Driving  Do not drive for 24 hours if you were given a sedative during your procedure. Follow other driving restrictions as told by your health care provider.  Do not drive or use heavy machinery while taking prescription pain medicine. Eating and drinking  Follow instructions from your health care provider about eating or drinking restrictions. This may include: ? What to eat and drink. You may be told to start eating a bland diet. Over time, you may slowly resume a more normal, healthy diet. ? How much to eat and drink. You should eat small meals often and stop eating when you feel full.  Take nutrition supplements as told by your health care provider or dietitian. General instructions  Take over-the-counter and prescription medicines only as told by your health care provider.  Take steps to treat diarrhea or constipation as told by your health care provider. Your health care provider may recommend that you: ? Drink enough fluid to keep your urine pale yellow. Avoid fluids that contain a lot of sugar or caffeine, such as energy drinks, sports drinks, and soda. ? Eat bland, easy-to-digest foods in small amounts as you are able. These foods include bananas, applesauce, rice, lean meats, toast, and crackers. ? Take over-the-counter or prescription medicines. ? Limit foods that are high in fat and processed sugars, such as fried or sweet foods.  Do not use any products that contain nicotine or tobacco, such as cigarettes, e-cigarettes, and chewing tobacco. These can delay incision healing after surgery. If you need help quitting, ask your health care provider.  Keep all follow-up visits as told by your health care provider. This is important. Contact a health care provider if:  You have more redness, swelling, or pain at the site of your incision.  You have more fluid or blood coming from your incision.  Your incision feels warm to the  touch.  You have pus or a bad smell coming from your incision.  You have a fever.  Your incision breaks open.  You feel nauseous.  You are not able to have a bowel movement (are constipated).  Your diarrhea gets worse.  You have pain that is not controlled with medicine. Get help right away if you have:  Abdominal pain that does not go away or becomes severe.  Frequent vomiting and you are not able to eat or drink.  Difficulty breathing. Summary  After colostomy reversal surgery, it is common to have abdominal pain, decreased appetite, diarrhea, or constipation.  Follow instructions from your health care provider about how to take care of your incision. Do not let the dressing get wet.  Take over-the-counter and prescription medicines only as told by your health care provider.  Contact your health care provider if you are not able to  have a bowel movement (are constipated).  Keep all follow-up visits as told by your health care provider. This is important. This information is not intended to replace advice given to you by your health care provider. Make sure you discuss any questions you have with your health care provider. Document Revised: 04/22/2018 Document Reviewed: 04/22/2018 Elsevier Patient Education  2020 [place] Anesthesia, Adult, Care After This sheet gives you information about how to care for yourself after your procedure. Your health care provider may also give you more specific instructions. If you have problems or questions, contact your health care provider. What can I expect after the procedure? After the procedure, the following side effects are common:  Pain or discomfort at the IV site.  Nausea.  Vomiting.  Sore throat.  Trouble concentrating.  Feeling cold or chills.  Weak or tired.  Sleepiness and fatigue.  Soreness and body aches. These side effects can affect parts of the body that were not involved in surgery. Follow  these instructions at home:  For at least 24 hours after the procedure:  Have a responsible adult stay with you. It is important to have someone help care for you until you are awake and alert.  Rest as needed.  Do not: ? Participate in activities in which you could fall or become injured. ? Drive. ? Use heavy machinery. ? Drink alcohol. ? Take sleeping pills or medicines that cause drowsiness. ? Make important decisions or sign legal documents. ? Take care of children on your own. Eating and drinking  Follow any instructions from your health care provider about eating or drinking restrictions.  When you feel hungry, start by eating small amounts of foods that are soft and easy to digest (bland), such as toast. Gradually return to your regular diet.  Drink enough fluid to keep your urine pale yellow.  If you vomit, rehydrate by drinking water, juice, or clear broth. General instructions  If you have sleep apnea, surgery and certain medicines can increase your risk for breathing problems. Follow instructions from your health care provider about wearing your sleep device: ? Anytime you are sleeping, including during daytime naps. ? While taking prescription pain medicines, sleeping medicines, or medicines that make you drowsy.  Return to your normal activities as told by your health care provider. Ask your health care provider what activities are safe for you.  Take over-the-counter and prescription medicines only as told by your health care provider.  If you smoke, do not smoke without supervision.  Keep all follow-up visits as told by your health care provider. This is important. Contact a health care provider if:  You have nausea or vomiting that does not get better with medicine.  You cannot eat or drink without vomiting.  You have pain that does not get better with medicine.  You are unable to pass urine.  You develop a skin rash.  You have a fever.  You have  redness around your IV site that gets worse. Get help right away if:  You have difficulty breathing.  You have chest pain.  You have blood in your urine or stool, or you vomit blood. Summary  After the procedure, it is common to have a sore throat or nausea. It is also common to feel tired.  Have a responsible adult stay with you for the first 24 hours after general anesthesia. It is important to have someone help care for you until you are awake and alert.  When  you feel hungry, start by eating small amounts of foods that are soft and easy to digest (bland), such as toast. Gradually return to your regular diet.  Drink enough fluid to keep your urine pale yellow.  Return to your normal activities as told by your health care provider. Ask your health care provider what activities are safe for you. This information is not intended to replace advice given to you by your health care provider. Make sure you discuss any questions you have with your health care provider. Document Revised: 11/07/2017 Document Reviewed: 06/20/2017 Elsevier Patient Education  [place]. How to Use Chlorhexidine for Bathing Chlorhexidine gluconate (CHG) is a germ-killing (antiseptic) solution that is used to clean the skin. It can get rid of the bacteria that normally live on the skin and can keep them away for about 24 hours. To clean your skin with CHG, you may be given:  A CHG solution to use in the shower or as part of a sponge bath.  A prepackaged cloth that contains CHG. Cleaning your skin with CHG may help lower the risk for infection:  While you are staying in the intensive care unit of the hospital.  If you have a vascular access, such as a central line, to provide short-term or long-term access to your veins.  If you have a catheter to drain urine from your bladder.  If you are on a ventilator. A ventilator is a machine that helps you breathe by moving air in and out of your  lungs.  After surgery. What are the risks? Risks of using CHG include:  A skin reaction.  Hearing loss, if CHG gets in your ears.  Eye injury, if CHG gets in your eyes and is not rinsed out.  The CHG product catching fire. Make sure that you avoid smoking and flames after applying CHG to your skin. Do not use CHG:  If you have a chlorhexidine allergy or have previously reacted to chlorhexidine.  On babies younger than 18 months of age. How to use CHG solution  Use CHG only as told by your health care provider, and follow the instructions on the label.  Use the full amount of CHG as directed. Usually, this is one bottle. During a shower Follow these steps when using CHG solution during a shower (unless your health care provider gives you different instructions): 1. Start the shower. 2. Use your normal soap and shampoo to wash your face and hair. 3. Turn off the shower or move out of the shower stream. 4. Pour the CHG onto a clean washcloth. Do not use any type of brush or rough-edged sponge. 5. Starting at your neck, lather your body down to your toes. Make sure you follow these instructions: ? If you will be having surgery, pay special attention to the part of your body where you will be having surgery. Scrub this area for at least 1 minute. ? Do not use CHG on your head or face. If the solution gets into your ears or eyes, rinse them well with water. ? Avoid your genital area. ? Avoid any areas of skin that have broken skin, cuts, or scrapes. ? Scrub your back and under your arms. Make sure to wash skin folds. 6. Let the lather sit on your skin for 1-2 minutes or as long as told by your health care provider. 7. Thoroughly rinse your entire body in the shower. Make sure that all body creases and crevices are rinsed well. 8.  Dry off with a clean towel. Do not put any substances on your body afterward--such as powder, lotion, or perfume--unless you are told to do so by your health  care provider. Only use lotions that are recommended by the manufacturer. 9. Put on clean clothes or pajamas. 10. If it is the night before your surgery, sleep in clean sheets.  During a sponge bath Follow these steps when using CHG solution during a sponge bath (unless your health care provider gives you different instructions): 1. Use your normal soap and shampoo to wash your face and hair. 2. Pour the CHG onto a clean washcloth. 3. Starting at your neck, lather your body down to your toes. Make sure you follow these instructions: ? If you will be having surgery, pay special attention to the part of your body where you will be having surgery. Scrub this area for at least 1 minute. ? Do not use CHG on your head or face. If the solution gets into your ears or eyes, rinse them well with water. ? Avoid your genital area. ? Avoid any areas of skin that have broken skin, cuts, or scrapes. ? Scrub your back and under your arms. Make sure to wash skin folds. 4. Let the lather sit on your skin for 1-2 minutes or as long as told by your health care provider. 5. Using a different clean, wet washcloth, thoroughly rinse your entire body. Make sure that all body creases and crevices are rinsed well. 6. Dry off with a clean towel. Do not put any substances on your body afterward--such as powder, lotion, or perfume--unless you are told to do so by your health care provider. Only use lotions that are recommended by the manufacturer. 7. Put on clean clothes or pajamas. 8. If it is the night before your surgery, sleep in clean sheets. How to use CHG prepackaged cloths  Only use CHG cloths as told by your health care provider, and follow the instructions on the label.  Use the CHG cloth on clean, dry skin.  Do not use the CHG cloth on your head or face unless your health care provider tells you to.  When washing with the CHG cloth: ? Avoid your genital area. ? Avoid any areas of skin that have broken  skin, cuts, or scrapes. Before surgery Follow these steps when using a CHG cloth to clean before surgery (unless your health care provider gives you different instructions): 1. Using the CHG cloth, vigorously scrub the part of your body where you will be having surgery. Scrub using a back-and-forth motion for 3 minutes. The area on your body should be completely wet with CHG when you are done scrubbing. 2. Do not rinse. Discard the cloth and let the area air-dry. Do not put any substances on the area afterward, such as powder, lotion, or perfume. 3. Put on clean clothes or pajamas. 4. If it is the night before your surgery, sleep in clean sheets.  For general bathing Follow these steps when using CHG cloths for general bathing (unless your health care provider gives you different instructions). 1. Use a separate CHG cloth for each area of your body. Make sure you wash between any folds of skin and between your fingers and toes. Wash your body in the following order, switching to a new cloth after each step: ? The front of your neck, shoulders, and chest. ? Both of your arms, under your arms, and your hands. ? Your stomach and groin area, avoiding  the genitals. ? Your right leg and foot. ? Your left leg and foot. ? The back of your neck, your back, and your buttocks. 2. Do not rinse. Discard the cloth and let the area air-dry. Do not put any substances on your body afterward--such as powder, lotion, or perfume--unless you are told to do so by your health care provider. Only use lotions that are recommended by the manufacturer. 3. Put on clean clothes or pajamas. Contact a health care provider if:  Your skin gets irritated after scrubbing.  You have questions about using your solution or cloth. Get help right away if:  Your eyes become very red or swollen.  Your eyes itch badly.  Your skin itches badly and is red or swollen.  Your hearing changes.  You have trouble seeing.  You have  swelling or tingling in your mouth or throat.  You have trouble breathing.  You swallow any chlorhexidine. Summary  Chlorhexidine gluconate (CHG) is a germ-killing (antiseptic) solution that is used to clean the skin. Cleaning your skin with CHG may help to lower your risk for infection.  You may be given CHG to use for bathing. It may be in a bottle or in a prepackaged cloth to use on your skin. Carefully follow your health care provider's instructions and the instructions on the product label.  Do not use CHG if you have a chlorhexidine allergy.  Contact your health care provider if your skin gets irritated after scrubbing. This information is not intended to replace advice given to you by your health care provider. Make sure you discuss any questions you have with your health care provider. Document Revised: 01/21/2019 Document Reviewed: 10/02/2017 Elsevier Patient Education  [place].

## 2020-10-10 NOTE — Progress Notes (Signed)
[facility] History and Physical   Chief Complaint    Follow-up      Vincent Cooper is a 79 y.o. male.  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 He has done well with his colostomy but did have issues with retraction and stenosis. He then got COVID prior to being reversed and had to have surgery delayed due desire to postpone for at least 6 weeks after his COVID diagnosis.  He has undergone a colonoscopy that demonstrated 2 tubular adenomas but was otherwise without issues, and has been doing well with keeping his stools regular and liquid to prevent obstruction from his stenosis.  He has seen his cardiologist, Dr. [name] who saw him preoperatively and recommended aggressive monitoring around the time of surgery and is ok with discontinuation of his Eliquis for A fib and prior PE 2-3 days before surgery.   The patient is doing well and wants to get the ostomy reversed. He has issues with leakage due to the stenosis and retraction. He does well but has to go through a lot of bags and insurance gives him issues with ordering more.   Past Medical History:  Diagnosis Date   Chronic gout    COPD (chronic obstructive pulmonary disease) ([place])    Oxygen dependent   Coronary artery disease due to calcified coronary lesion 03/08/2020   CORONARY CT ANGIOGRAM:  Cor Ca2+ Score 1651. CAD-RADS 4 - Severe.  ? >50% LM stenosis -->CTFFR -- NOT PHYSIOLOGICALLY SIGNIFICANT; Large Dom RCA-<PDA/PLA.  Diffuse mild to mod plaque (25-49%) prox segment and mod (50-69%) mid-distal;  CTFFR: prox 0.95, mid 0.88, distal 0.86 (not significant); Med-sized LAD w/ long diffuse mod-severe plaque calcified plaque in prox-mid ~ 50 -69%, ? >70% -> CTFFR :    Diabetes mellitus type II, non insulin dependent (HCC)    Hypertension    Morbid obesity (HCC)    BMI of 38.5 with multiple risk factors.   OSA on CPAP    Pulmonary emboli ([place]) 10/2019     Chest CTA-4 Phadke opacification of main PA but there is partially occlusive main posterior RLL and segmental/segmental branches.  Small thrombus noted in the anterior right middle lobe.  No RV strain.  Scattered aortic atherosclerosis involving great vessels.  Coronary calcification noted.   RLS (restless legs syndrome)     Past Surgical History:  Procedure Laterality Date   Cataract surgery Right    COLONOSCOPY WITH PROPOFOL N/A 09/27/2020   Procedure: COLONOSCOPY WITH PROPOFOL;  Surgeon: Rogene Houston, MD;  [facility];  Service: Endoscopy;  Laterality: N/A;  1:15   LAPAROTOMY N/A 06/21/2020   Procedure: EXPLORATORY LAPAROTOMY,  bowel resection, creation ostomy;  Surgeon: Virl Cagey, MD;  Location: [facility];  Service: General;  Laterality: N/A;   POLYPECTOMY  09/27/2020   Procedure: POLYPECTOMY;  Surgeon: Rogene Houston, MD;  [facility]: [facility];  Service: Endoscopy;;    Family History  Problem Relation Age of Onset   Colon cancer Mother    Emphysema Father    Diverticulitis Sister    Diabetes Brother    Heart Problems Maternal Grandmother    Diabetes Brother    Lupus Son    Breast cancer Daughter    CAD Neg Hx     Social History   Tobacco Use   Smoking status: Former Smoker   Smokeless tobacco: Former Network engineer  Vaping Use: Never used  Substance Use Topics   Alcohol use: Not Currently   Drug use: Never    Medications: I have reviewed the patient's current medications. Allergies as of 10/10/2020      Reactions   Other    Patient reports he was allergic to something in an IV he was given but does not know what the substance was. As of 01/19/2020       Medication List       Accurate as of October 10, 2020 10:02 AM. If you have any questions, ask your nurse or doctor.        albuterol 108 (90 Base) MCG/ACT inhaler Commonly known as: VENTOLIN HFA Inhale 1 puff into the lungs every 6 (six) hours as needed for  wheezing or shortness of breath.   apixaban 2.5 MG Tabs tablet Commonly known as: ELIQUIS Take 1 tablet (2.5 mg total) by mouth 2 (two) times daily.   diltiazem 360 MG 24 hr capsule Commonly known as: TIAZAC Take 360 mg by mouth daily.   Dulcolax 5 MG EC tablet Generic drug: bisacodyl Take 4 tablets (20 mg total) by mouth once for 1 dose. Take 20 mg of dulcolax at 7AM the morning prior to surgery Started by: Virl Cagey, MD   glimepiride 2 MG tablet Commonly known as: AMARYL Take 2 mg by mouth daily with breakfast.   guaiFENesin 600 MG 12 hr tablet Commonly known as: MUCINEX Take 1 tablet (600 mg total) by mouth 2 (two) times daily.   ibuprofen 200 MG tablet Commonly known as: ADVIL Take 400 mg by mouth every 6 (six) hours as needed for moderate pain.   insulin detemir 100 UNIT/ML injection Commonly known as: LEVEMIR Inject 0.07 mLs (7 Units total) into the skin 2 (two) times daily.   ipratropium-albuterol 0.5-2.5 (3) MG/3ML Soln Commonly known as: DUONEB Take 3 mLs by nebulization every 6 (six) hours as needed (shortness of breath).   metoprolol tartrate 25 MG tablet Commonly known as: LOPRESSOR Take 1 tablet (25 mg total) by mouth 2 (two) times daily.   metroNIDAZOLE 500 MG tablet Commonly known as: Flagyl Take 2 tablets (1,000 mg total) by mouth as directed. Take 2 metronidazole / flagyl 500mg  tablets at 2 pm, 3pm, and 10pm. Started by: Virl Cagey, MD   neomycin 500 MG tablet Commonly known as: MYCIFRADIN Take 2 tablets (1,000 mg total) by mouth as directed. Take 2 neomycin 500mg  tablets at 2 pm, 3pm, and 10pm. Started by: Virl Cagey, MD   nitroGLYCERIN 0.4 MG SL tablet Commonly known as: NITROSTAT Place 1 tablet (0.4 mg total) under the tongue every 5 (five) minutes as needed for chest pain.   potassium chloride SA 20 MEQ tablet Commonly known as: KLOR-CON Take 20 mEq by mouth daily.   rosuvastatin 40 MG tablet Commonly known as:  CRESTOR Take 1 tablet (40 mg total) by mouth daily. What changed: when to take this   senna-docusate 8.6-50 MG tablet Commonly known as: Senokot-S Take 2 tablets by mouth at bedtime.   tamsulosin 0.4 MG Caps capsule Commonly known as: FLOMAX Take 1 capsule (0.4 mg total) by mouth daily. What changed: when to take this   torsemide 20 MG tablet Commonly known as: DEMADEX Take 20 mg by mouth 2 (two) times daily.   Trelegy Ellipta 200-62.5-25 MCG/INH Aepb Generic drug: Fluticasone-Umeclidin-Vilant Inhale 1 puff into the lungs daily.   triamcinolone 0.1 % Commonly known as: KENALOG Apply 1 application topically 2 (  two) times daily.        ROS:  A comprehensive review of systems was negative except for: Gastrointestinal: positive for skin irritation around colostomy No recent chest pain or SOB  Blood pressure 129/69, pulse 64, temperature 97.6 F (36.4 C), temperature source Oral, resp. rate 16, height 5\' 9"  (1.753 m), weight 238 lb (108 kg), SpO2 93 %. Physical Exam Vitals reviewed.  Constitutional:      Appearance: He is obese.  HENT:     Head: Normocephalic.     Nose: Nose normal. No congestion.     Mouth/Throat:     Mouth: Mucous membranes are moist.  Eyes:     Extraocular Movements: Extraocular movements intact.     Pupils: Pupils are equal, round, and reactive to light.  Cardiovascular:     Rate and Rhythm: Normal rate.  Pulmonary:     Effort: Pulmonary effort is normal.     Breath sounds: Normal breath sounds.  Abdominal:     General: There is no distension.     Palpations: Abdomen is soft.     Tenderness: There is no abdominal tenderness.     Comments: Ostomy retracted and stenosis skin, dilated digitally, lumen patent, gas and stool in bag, skin irritation around ostomy, midline healing  Musculoskeletal:     Cervical back: Normal range of motion.  Neurological:     Mental Status: He is alert.     Results: None   Assessment & Plan:  Vincent Cooper is  a 79 y.o. 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 Given his stenosis and retraction I do not want to delay reversal longer as he is at risk of obstructing if the skin becomes more stenotic.  He is doing well now and has recovered from his [place]. He is more than 6 weeks out from that diagnosis.    -Discussed colostomy reversal and risk of bleeding, infection, anastomotic leak, injury to other organs and discussed risk of cardiac issues given his history. Discussed arterial aline placement and central line placement for monitoring and risk of bleeding, infection, pneumothorax. Discussed possibly needing blood and risk of allergic reactions and infections. He is ready to proceed with surgery and will receive blood.  Buy from the Store: Miralax bottle [phone]).  Gatorade 64 oz (not red). Dulcolax tablets.   The Day Prior to Surgery: Take 4 ducolax tablets at 7am with water. Do an enema through your rectum at 8AM. Drink plenty of clear liquids all day to avoid dehydration, no solid food.    Mix the bottle of Miralax and 64 oz of Gatorade and drink this mixture starting at 10am.  Drink it gradually over the next few hours, 8 ounces every 15-30 minutes until it is gone. Finish this by 2pm.  Repeat an enema at 2pm.  Take 2 neomycin 500mg  tablets and 2 metronidazole 500mg  tablets at 2 pm. Take 2 neomycin 500mg  tablets and 2 metronidazole 500mg  tablets at 3pm. Take 2 neomycin 500mg  tablets and 2 metronidazole 500mg  tablets at 10pm.    Do not eat or drink anything after midnight the night before your surgery.  Do not eat or drink anything that morning, and take medications as instructed by the hospital staff on your preoperative visit.    All questions were answered to the satisfaction of the patient.    Virl Cagey 10/10/2020, 10:02 AM

## 2020-10-11 NOTE — H&P (Signed)
[facility] History and Physical   Chief Complaint    Follow-up      Vincent Cooper is a 79 y.o. male.  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 Vincent Cooper has done well with his colostomy but did have issues with retraction and stenosis. Vincent Cooper then got COVID prior to being reversed and had to have surgery delayed due desire to postpone for at least 6 weeks after his COVID diagnosis.  Vincent Cooper has undergone a colonoscopy that demonstrated 2 tubular adenomas but was otherwise without issues, and has been doing well with keeping his stools regular and liquid to prevent obstruction from his stenosis.  Vincent Cooper has seen his cardiologist, Dr. [name] who saw him preoperatively and recommended aggressive monitoring around the time of surgery and is ok with discontinuation of his Eliquis for A fib and prior PE 2-3 days before surgery.   The patient is doing well and wants to get the ostomy reversed. Vincent Cooper has issues with leakage due to the stenosis and retraction. Vincent Cooper does well but has to go through a lot of bags and insurance gives him issues with ordering more.   Past Medical History:  Diagnosis Date  . Chronic gout   . COPD (chronic obstructive pulmonary disease) ([place])    Oxygen dependent  . 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 CAD-RADS 4 - Severe.  ? >50% LM stenosis -->CTFFR -- NOT PHYSIOLOGICALLY SIGNIFICANT; Large Dom RCA-<PDA/PLA.  Diffuse mild to mod plaque (25-49%) prox segment and mod (50-69%) mid-distal;  CTFFR: prox 0.95, mid 0.88, distal 0.86 (not significant); Med-sized LAD w/ long diffuse mod-severe plaque calcified plaque in prox-mid ~ 50 -69%, ? >70% -> CTFFR :   . Diabetes mellitus type II, non insulin dependent ([place])   . Hypertension   . Morbid obesity (HCC)    BMI of 38.5 with multiple risk factors.  . OSA on CPAP   . Pulmonary emboli ([place]) 10/2019     Chest CTA-4 Phadke opacification of main PA but there is partially occlusive main posterior RLL and segmental/segmental branches.  Small thrombus noted in the anterior right middle lobe.  No RV strain.  Scattered aortic atherosclerosis involving great vessels.  Coronary calcification noted.  Marland Kitchen RLS (restless legs syndrome)     Past Surgical History:  Procedure Laterality Date  . Cataract surgery Right   . COLONOSCOPY WITH PROPOFOL N/A 09/27/2020   Procedure: COLONOSCOPY WITH PROPOFOL;  Surgeon: Rogene Houston, MD;  [facility];  Service: Endoscopy;  Laterality: N/A;  1:15  . LAPAROTOMY N/A 06/21/2020   Procedure: EXPLORATORY LAPAROTOMY,  bowel resection, creation ostomy;  Surgeon: Virl Cagey, MD;  Location: [facility];  Service: General;  Laterality: N/A;  . POLYPECTOMY  09/27/2020   Procedure: POLYPECTOMY;  Surgeon: Rogene Houston, MD;  [facility];  Service: Endoscopy;;    Family History  Problem Relation Age of Onset  . Colon cancer Mother   . Emphysema Father   . Diverticulitis Sister   . Diabetes Brother   . Heart Problems Maternal Grandmother   . Diabetes Brother   . Lupus Son   . Breast cancer Daughter   . CAD Neg Hx     Social History   Tobacco Use  . Smoking status: Former Research scientist (life sciences)  . Smokeless tobacco: Former Network engineer  .  Vaping Use: Never used  Substance Use Topics  . Alcohol use: Not Currently  . Drug use: Never    Medications: I have reviewed the patient's current medications. Allergies as of 10/10/2020      Reactions   Other    Patient reports Vincent Cooper was allergic to something in an IV Vincent Cooper was given but does not know what the substance was. As of 01/19/2020       Medication List       Accurate as of October 10, 2020 10:02 AM. If you have any questions, ask your nurse or doctor.        albuterol 108 (90 Base) MCG/ACT inhaler Commonly known as: VENTOLIN HFA Inhale 1 puff into the lungs every 6 (six) hours as needed for  wheezing or shortness of breath.   apixaban 2.5 MG Tabs tablet Commonly known as: ELIQUIS Take 1 tablet (2.5 mg total) by mouth 2 (two) times daily.   diltiazem 360 MG 24 hr capsule Commonly known as: TIAZAC Take 360 mg by mouth daily.   Dulcolax 5 MG EC tablet Generic drug: bisacodyl Take 4 tablets (20 mg total) by mouth once for 1 dose. Take 20 mg of dulcolax at 7AM the morning prior to surgery Started by: Virl Cagey, MD   glimepiride 2 MG tablet Commonly known as: AMARYL Take 2 mg by mouth daily with breakfast.   guaiFENesin 600 MG 12 hr tablet Commonly known as: MUCINEX Take 1 tablet (600 mg total) by mouth 2 (two) times daily.   ibuprofen 200 MG tablet Commonly known as: ADVIL Take 400 mg by mouth every 6 (six) hours as needed for moderate pain.   insulin detemir 100 UNIT/ML injection Commonly known as: LEVEMIR Inject 0.07 mLs (7 Units total) into the skin 2 (two) times daily.   ipratropium-albuterol 0.5-2.5 (3) MG/3ML Soln Commonly known as: DUONEB Take 3 mLs by nebulization every 6 (six) hours as needed (shortness of breath).   metoprolol tartrate 25 MG tablet Commonly known as: LOPRESSOR Take 1 tablet (25 mg total) by mouth 2 (two) times daily.   metroNIDAZOLE 500 MG tablet Commonly known as: Flagyl Take 2 tablets (1,000 mg total) by mouth as directed. Take 2 metronidazole / flagyl 500mg  tablets at 2 pm, 3pm, and 10pm. Started by: Virl Cagey, MD   neomycin 500 MG tablet Commonly known as: MYCIFRADIN Take 2 tablets (1,000 mg total) by mouth as directed. Take 2 neomycin 500mg  tablets at 2 pm, 3pm, and 10pm. Started by: Virl Cagey, MD   nitroGLYCERIN 0.4 MG SL tablet Commonly known as: NITROSTAT Place 1 tablet (0.4 mg total) under the tongue every 5 (five) minutes as needed for chest pain.   potassium chloride SA 20 MEQ tablet Commonly known as: KLOR-CON Take 20 mEq by mouth daily.   rosuvastatin 40 MG tablet Commonly known as:  CRESTOR Take 1 tablet (40 mg total) by mouth daily. What changed: when to take this   senna-docusate 8.6-50 MG tablet Commonly known as: Senokot-S Take 2 tablets by mouth at bedtime.   tamsulosin 0.4 MG Caps capsule Commonly known as: FLOMAX Take 1 capsule (0.4 mg total) by mouth daily. What changed: when to take this   torsemide 20 MG tablet Commonly known as: DEMADEX Take 20 mg by mouth 2 (two) times daily.   Trelegy Ellipta 200-62.5-25 MCG/INH Aepb Generic drug: Fluticasone-Umeclidin-Vilant Inhale 1 puff into the lungs daily.   triamcinolone 0.1 % Commonly known as: KENALOG Apply 1 application topically 2 (  two) times daily.        ROS:  A comprehensive review of systems was negative except for: Gastrointestinal: positive for skin irritation around colostomy No recent chest pain or SOB  Blood pressure 129/69, pulse 64, temperature 97.6 F (36.4 C), temperature source Oral, resp. rate 16, height 5\' 9"  (1.753 m), weight 238 lb (108 kg), SpO2 93 %. Physical Exam Vitals reviewed.  Constitutional:      Appearance: Vincent Cooper is obese.  HENT:     Head: Normocephalic.     Nose: Nose normal. No congestion.     Mouth/Throat:     Mouth: Mucous membranes are moist.  Eyes:     Extraocular Movements: Extraocular movements intact.     Pupils: Pupils are equal, round, and reactive to light.  Cardiovascular:     Rate and Rhythm: Normal rate.  Pulmonary:     Effort: Pulmonary effort is normal.     Breath sounds: Normal breath sounds.  Abdominal:     General: There is no distension.     Palpations: Abdomen is soft.     Tenderness: There is no abdominal tenderness.     Comments: Ostomy retracted and stenosis skin, dilated digitally, lumen patent, gas and stool in bag, skin irritation around ostomy, midline healing  Musculoskeletal:     Cervical back: Normal range of motion.  Neurological:     Mental Status: Vincent Cooper is alert.     Results: None   Assessment & Plan:  Vincent Cooper is  a 79 y.o. 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 Given his stenosis and retraction I do not want to delay reversal longer as Vincent Cooper is at risk of obstructing if the skin becomes more stenotic.  Vincent Cooper is doing well now and has recovered from his [place]. Vincent Cooper is more than 6 weeks out from that diagnosis.    -Discussed colostomy reversal and risk of bleeding, infection, anastomotic leak, injury to other organs and discussed risk of cardiac issues given his history. Discussed arterial aline placement and central line placement for monitoring and risk of bleeding, infection, pneumothorax. Discussed possibly needing blood and risk of allergic reactions and infections. Vincent Cooper is ready to proceed with surgery and will receive blood.  Buy from the Store: Miralax bottle [phone]).  Gatorade 64 oz (not red). Dulcolax tablets.   The Day Prior to Surgery: Take 4 ducolax tablets at 7am with water. Do an enema through your rectum at 8AM. Drink plenty of clear liquids all day to avoid dehydration, no solid food.    Mix the bottle of Miralax and 64 oz of Gatorade and drink this mixture starting at 10am.  Drink it gradually over the next few hours, 8 ounces every 15-30 minutes until it is gone. Finish this by 2pm.  Repeat an enema at 2pm.  Take 2 neomycin 500mg  tablets and 2 metronidazole 500mg  tablets at 2 pm. Take 2 neomycin 500mg  tablets and 2 metronidazole 500mg  tablets at 3pm. Take 2 neomycin 500mg  tablets and 2 metronidazole 500mg  tablets at 10pm.    Do not eat or drink anything after midnight the night before your surgery.  Do not eat or drink anything that morning, and take medications as instructed by the hospital staff on your preoperative visit.    All questions were answered to the satisfaction of the patient.    Virl Cagey 10/10/2020, 10:02 AM

## 2020-10-16 ENCOUNTER — Encounter (HOSPITAL_COMMUNITY)
Admission: RE | Admit: 2020-10-16 | Discharge: 2020-10-16 | Disposition: A | Discharge status: Home / Self Care | Payer: Medicare PPO | Source: Ambulatory Visit | Attending: General Surgery | Admitting: General Surgery

## 2020-10-16 ENCOUNTER — Other Ambulatory Visit (HOSPITAL_COMMUNITY)
Admission: RE | Admit: 2020-10-16 | Discharge: 2020-10-16 | Disposition: A | Discharge status: Home / Self Care | Payer: Medicare PPO | Source: Ambulatory Visit | Attending: General Surgery | Admitting: General Surgery

## 2020-10-16 ENCOUNTER — Other Ambulatory Visit: Payer: Self-pay

## 2020-10-16 DIAGNOSIS — Z01812 Encounter for preprocedural laboratory examination: Secondary | ICD-10-CM | POA: Insufficient documentation

## 2020-10-16 DIAGNOSIS — Z20822 Contact with and (suspected) exposure to covid-19: Secondary | ICD-10-CM | POA: Insufficient documentation

## 2020-10-16 LAB — COMPREHENSIVE METABOLIC PANEL
ALT: 28 U/L (ref 0–44)
AST: 24 U/L (ref 15–41)
Albumin: 3.4 g/dL — ABNORMAL LOW (ref 3.5–5.0)
Alkaline Phosphatase: 40 U/L (ref 38–126)
Anion gap: 7 (ref 5–15)
BUN: 20 mg/dL (ref 8–23)
CO2: 26 mmol/L (ref 22–32)
Calcium: 9 mg/dL (ref 8.9–10.3)
Chloride: 104 mmol/L (ref 98–111)
Creatinine, Ser: 1.12 mg/dL (ref 0.61–1.24)
GFR, Estimated: >60 mL/min (ref >60)
Glucose, Bld: 81 mg/dL (ref 70–99)
Potassium: 4 mmol/L (ref 3.5–5.1)
Sodium: 137 mmol/L (ref 135–145)
Total Bilirubin: 0.7 mg/dL (ref 0.3–1.2)
Total Protein: 6.3 g/dL — ABNORMAL LOW (ref 6.5–8.1)

## 2020-10-16 LAB — CBC WITH DIFFERENTIAL/PLATELET
Abs Immature Granulocytes: 0.01 10*3/uL (ref 0.00–0.07)
Basophils Absolute: 0 10*3/uL (ref 0.0–0.1)
Basophils Relative: 1 %
Eosinophils Absolute: 0.6 10*3/uL — ABNORMAL HIGH (ref 0.0–0.5)
Eosinophils Relative: 9 %
HCT: 38.3 % — ABNORMAL LOW (ref 39.0–52.0)
Hemoglobin: 11.8 g/dL — ABNORMAL LOW (ref 13.0–17.0)
Immature Granulocytes: 0 %
Lymphocytes Relative: 21 %
Lymphs Abs: 1.3 10*3/uL (ref 0.7–4.0)
MCH: 29.4 pg (ref 26.0–34.0)
MCHC: 30.8 g/dL (ref 30.0–36.0)
MCV: 95.5 fL (ref 80.0–100.0)
Monocytes Absolute: 0.7 10*3/uL (ref 0.1–1.0)
Monocytes Relative: 10 %
Neutro Abs: 3.7 10*3/uL (ref 1.7–7.7)
Neutrophils Relative %: 59 %
Platelets: 247 10*3/uL (ref 150–400)
RBC: 4.01 MIL/uL — ABNORMAL LOW (ref 4.22–5.81)
RDW: 17.9 % — ABNORMAL HIGH (ref 11.5–15.5)
WBC: 6.3 10*3/uL (ref 4.0–10.5)
nRBC: 0 % (ref 0.0–0.2)

## 2020-10-16 LAB — SARS CORONAVIRUS 2 (TAT 6-24 HRS): SARS Coronavirus 2: NEGATIVE

## 2020-10-16 LAB — HEMOGLOBIN A1C
Hgb A1c MFr Bld: 4.7 % — ABNORMAL LOW (ref 4.8–5.6)
Mean Plasma Glucose: 88.19 mg/dL

## 2020-10-16 LAB — GLUCOSE, CAPILLARY: Glucose-Capillary: 86 mg/dL (ref 70–99)

## 2020-10-16 LAB — PREPARE RBC (CROSSMATCH)

## 2020-10-18 ENCOUNTER — Encounter (HOSPITAL_COMMUNITY): Payer: Self-pay | Admitting: General Surgery

## 2020-10-18 ENCOUNTER — Inpatient Hospital Stay (HOSPITAL_COMMUNITY)
Admission: RE | Admit: 2020-10-18 | Discharge: 2020-10-23 | DRG: 329 | Disposition: A | Discharge status: Home Health | Payer: Medicare PPO | Attending: General Surgery | Admitting: General Surgery

## 2020-10-18 ENCOUNTER — Inpatient Hospital Stay (HOSPITAL_COMMUNITY): Payer: Medicare PPO | Admitting: Anesthesiology

## 2020-10-18 ENCOUNTER — Inpatient Hospital Stay: Payer: Self-pay

## 2020-10-18 ENCOUNTER — Inpatient Hospital Stay (HOSPITAL_COMMUNITY): Payer: Medicare PPO

## 2020-10-18 ENCOUNTER — Encounter (HOSPITAL_COMMUNITY)
Admission: RE | Disposition: A | Discharge status: Home Health | Payer: Self-pay | Source: Home / Self Care | Attending: General Surgery

## 2020-10-18 ENCOUNTER — Other Ambulatory Visit: Payer: Self-pay

## 2020-10-18 DIAGNOSIS — Z87891 Personal history of nicotine dependence: Secondary | ICD-10-CM

## 2020-10-18 DIAGNOSIS — Z86711 Personal history of pulmonary embolism: Secondary | ICD-10-CM | POA: Diagnosis not present

## 2020-10-18 DIAGNOSIS — G2581 Restless legs syndrome: Secondary | ICD-10-CM | POA: Diagnosis present

## 2020-10-18 DIAGNOSIS — B965 Pseudomonas (aeruginosa) (mallei) (pseudomallei) as the cause of diseases classified elsewhere: Secondary | ICD-10-CM | POA: Diagnosis present

## 2020-10-18 DIAGNOSIS — E785 Hyperlipidemia, unspecified: Secondary | ICD-10-CM | POA: Diagnosis present

## 2020-10-18 DIAGNOSIS — Z8616 Personal history of COVID-19: Secondary | ICD-10-CM | POA: Diagnosis not present

## 2020-10-18 DIAGNOSIS — Z7902 Long term (current) use of antithrombotics/antiplatelets: Secondary | ICD-10-CM

## 2020-10-18 DIAGNOSIS — Z933 Colostomy status: Secondary | ICD-10-CM | POA: Diagnosis not present

## 2020-10-18 DIAGNOSIS — K630 Abscess of intestine: Secondary | ICD-10-CM | POA: Diagnosis present

## 2020-10-18 DIAGNOSIS — Z825 Family history of asthma and other chronic lower respiratory diseases: Secondary | ICD-10-CM

## 2020-10-18 DIAGNOSIS — E119 Type 2 diabetes mellitus without complications: Secondary | ICD-10-CM | POA: Diagnosis present

## 2020-10-18 DIAGNOSIS — N400 Enlarged prostate without lower urinary tract symptoms: Secondary | ICD-10-CM | POA: Diagnosis present

## 2020-10-18 DIAGNOSIS — I2510 Atherosclerotic heart disease of native coronary artery without angina pectoris: Secondary | ICD-10-CM | POA: Diagnosis present

## 2020-10-18 DIAGNOSIS — K9403 Colostomy malfunction: Secondary | ICD-10-CM | POA: Diagnosis present

## 2020-10-18 DIAGNOSIS — I361 Nonrheumatic tricuspid (valve) insufficiency: Secondary | ICD-10-CM | POA: Diagnosis not present

## 2020-10-18 DIAGNOSIS — I2584 Coronary atherosclerosis due to calcified coronary lesion: Secondary | ICD-10-CM | POA: Diagnosis present

## 2020-10-18 DIAGNOSIS — K651 Peritoneal abscess: Secondary | ICD-10-CM | POA: Diagnosis present

## 2020-10-18 DIAGNOSIS — Y929 Unspecified place or not applicable: Secondary | ICD-10-CM

## 2020-10-18 DIAGNOSIS — Z8249 Family history of ischemic heart disease and other diseases of the circulatory system: Secondary | ICD-10-CM

## 2020-10-18 DIAGNOSIS — Z889 Allergy status to unspecified drugs, medicaments and biological substances status: Secondary | ICD-10-CM

## 2020-10-18 DIAGNOSIS — J449 Chronic obstructive pulmonary disease, unspecified: Secondary | ICD-10-CM | POA: Diagnosis present

## 2020-10-18 DIAGNOSIS — Z9981 Dependence on supplemental oxygen: Secondary | ICD-10-CM | POA: Diagnosis not present

## 2020-10-18 DIAGNOSIS — Y833 Surgical operation with formation of external stoma as the cause of abnormal reaction of the patient, or of later complication, without mention of misadventure at the time of the procedure: Secondary | ICD-10-CM | POA: Diagnosis present

## 2020-10-18 DIAGNOSIS — Z803 Family history of malignant neoplasm of breast: Secondary | ICD-10-CM

## 2020-10-18 DIAGNOSIS — Z833 Family history of diabetes mellitus: Secondary | ICD-10-CM

## 2020-10-18 DIAGNOSIS — Z79899 Other long term (current) drug therapy: Secondary | ICD-10-CM

## 2020-10-18 DIAGNOSIS — I4891 Unspecified atrial fibrillation: Secondary | ICD-10-CM | POA: Diagnosis not present

## 2020-10-18 DIAGNOSIS — Z20822 Contact with and (suspected) exposure to covid-19: Secondary | ICD-10-CM | POA: Diagnosis present

## 2020-10-18 DIAGNOSIS — Z800 Family history of malignant neoplasm of digestive organs: Secondary | ICD-10-CM

## 2020-10-18 DIAGNOSIS — Z7984 Long term (current) use of oral hypoglycemic drugs: Secondary | ICD-10-CM

## 2020-10-18 DIAGNOSIS — I10 Essential (primary) hypertension: Secondary | ICD-10-CM | POA: Diagnosis present

## 2020-10-18 DIAGNOSIS — M1A9XX0 Chronic gout, unspecified, without tophus (tophi): Secondary | ICD-10-CM | POA: Diagnosis present

## 2020-10-18 DIAGNOSIS — Z0189 Encounter for other specified special examinations: Secondary | ICD-10-CM

## 2020-10-18 DIAGNOSIS — K660 Peritoneal adhesions (postprocedural) (postinfection): Secondary | ICD-10-CM | POA: Diagnosis present

## 2020-10-18 DIAGNOSIS — Z6838 Body mass index (BMI) 38.0-38.9, adult: Secondary | ICD-10-CM | POA: Diagnosis not present

## 2020-10-18 DIAGNOSIS — G4733 Obstructive sleep apnea (adult) (pediatric): Secondary | ICD-10-CM | POA: Diagnosis present

## 2020-10-18 DIAGNOSIS — Z794 Long term (current) use of insulin: Secondary | ICD-10-CM

## 2020-10-18 DIAGNOSIS — E876 Hypokalemia: Secondary | ICD-10-CM | POA: Diagnosis not present

## 2020-10-18 DIAGNOSIS — I4821 Permanent atrial fibrillation: Secondary | ICD-10-CM

## 2020-10-18 HISTORY — PX: BOWEL RESECTION: SHX1257

## 2020-10-18 HISTORY — PX: COLOSTOMY REVERSAL: SHX5782

## 2020-10-18 LAB — GLUCOSE, CAPILLARY
Glucose-Capillary: 76 mg/dL (ref 70–99)
Glucose-Capillary: 94 mg/dL (ref 70–99)
Glucose-Capillary: 124 mg/dL — ABNORMAL HIGH (ref 70–99)
Glucose-Capillary: 142 mg/dL — ABNORMAL HIGH (ref 70–99)
Glucose-Capillary: 162 mg/dL — ABNORMAL HIGH (ref 70–99)
Glucose-Capillary: 151 mg/dL — ABNORMAL HIGH (ref 70–99)
Glucose-Capillary: 157 mg/dL — ABNORMAL HIGH (ref 70–99)
Glucose-Capillary: 119 mg/dL — ABNORMAL HIGH (ref 70–99)

## 2020-10-18 LAB — PREPARE RBC (CROSSMATCH)

## 2020-10-18 SURGERY — COLOSTOMY REVERSAL
Anesthesia: General | Site: Abdomen

## 2020-10-18 MED ORDER — BUPIVACAINE LIPOSOME 1.3 % IJ SUSP
INTRAMUSCULAR | Status: DC | PRN
  Administered 2020-10-18: 20 mL

## 2020-10-18 MED ORDER — EPHEDRINE SULFATE 50 MG/ML IJ SOLN
INTRAMUSCULAR | Status: DC | PRN
  Administered 2020-10-18 (×4): 10 mg via INTRAVENOUS

## 2020-10-18 MED ORDER — SUCCINYLCHOLINE 20MG/ML (10ML) SYRINGE FOR MEDFUSION PUMP - OPTIME
INTRAMUSCULAR | Status: DC | PRN
  Administered 2020-10-18: 120 mg via INTRAVENOUS

## 2020-10-18 MED ORDER — ONDANSETRON HCL 4 MG PO TABS
4.0000 mg | ORAL_TABLET | Freq: Four times a day (QID) | ORAL | Status: DC | PRN
  Filled 2020-10-18: qty 1

## 2020-10-18 MED ORDER — FENTANYL CITRATE (PF) 250 MCG/5ML IJ SOLN
INTRAMUSCULAR | Status: AC
  Filled 2020-10-18: qty 5

## 2020-10-18 MED ORDER — LIDOCAINE HCL (CARDIAC) PF 50 MG/5ML IV SOSY
PREFILLED_SYRINGE | INTRAVENOUS | Status: DC | PRN
  Administered 2020-10-18: 6 mg via INTRAVENOUS

## 2020-10-18 MED ORDER — INSULIN ASPART 100 UNIT/ML ~~LOC~~ SOLN
0.0000 [IU] | SUBCUTANEOUS | Status: DC
  Administered 2020-10-18: 4 [IU] via SUBCUTANEOUS

## 2020-10-18 MED ORDER — MORPHINE SULFATE (PF) 2 MG/ML IV SOLN
2.0000 mg | INTRAVENOUS | Status: DC | PRN
  Administered 2020-10-18: 2 mg via INTRAVENOUS
  Filled 2020-10-18: qty 1

## 2020-10-18 MED ORDER — DILTIAZEM HCL ER BEADS 120 MG PO CP24
360.0000 mg | ORAL_CAPSULE | Freq: Every day | ORAL | Status: DC
  Administered 2020-10-19 – 2020-10-23 (×5): 360 mg via ORAL
  Filled 2020-10-18 (×12): qty 3

## 2020-10-18 MED ORDER — ONDANSETRON HCL 4 MG/2ML IJ SOLN
INTRAMUSCULAR | Status: AC
  Filled 2020-10-18: qty 4

## 2020-10-18 MED ORDER — LACTATED RINGERS IV SOLN: INTRAVENOUS | Status: DC

## 2020-10-18 MED ORDER — FLUTICASONE FUROATE-VILANTEROL 200-25 MCG/INH IN AEPB
1.0000 | INHALATION_SPRAY | Freq: Every day | RESPIRATORY_TRACT | Status: DC
  Administered 2020-10-19 – 2020-10-23 (×5): 1 via RESPIRATORY_TRACT
  Filled 2020-10-18 (×2): qty 28

## 2020-10-18 MED ORDER — UMECLIDINIUM BROMIDE 62.5 MCG/INH IN AEPB
1.0000 | INHALATION_SPRAY | Freq: Every day | RESPIRATORY_TRACT | Status: DC
  Administered 2020-10-19 – 2020-10-23 (×5): 1 via RESPIRATORY_TRACT
  Filled 2020-10-18 (×2): qty 7

## 2020-10-18 MED ORDER — PROPOFOL 10 MG/ML IV BOLUS
INTRAVENOUS | Status: DC | PRN
  Administered 2020-10-18: 50 mg via INTRAVENOUS
  Administered 2020-10-18: 175 mg via INTRAVENOUS

## 2020-10-18 MED ORDER — HEPARIN SODIUM (PORCINE) 5000 UNIT/ML IJ SOLN
5000.0000 [IU] | Freq: Once | INTRAMUSCULAR | Status: AC
  Administered 2020-10-18: 5000 [IU] via SUBCUTANEOUS

## 2020-10-18 MED ORDER — SUCCINYLCHOLINE CHLORIDE 200 MG/10ML IV SOSY
PREFILLED_SYRINGE | INTRAVENOUS | Status: AC
  Filled 2020-10-18: qty 10

## 2020-10-18 MED ORDER — PROPOFOL 10 MG/ML IV BOLUS
INTRAVENOUS | Status: AC
  Filled 2020-10-18: qty 40

## 2020-10-18 MED ORDER — ONDANSETRON HCL 4 MG/2ML IJ SOLN: 4.0000 mg | Freq: Four times a day (QID) | INTRAMUSCULAR | Status: DC | PRN

## 2020-10-18 MED ORDER — NITROGLYCERIN 0.4 MG SL SUBL: 0.4000 mg | SUBLINGUAL_TABLET | SUBLINGUAL | Status: DC | PRN

## 2020-10-18 MED ORDER — CHLORHEXIDINE GLUCONATE CLOTH 2 % EX PADS: 6.0000 | MEDICATED_PAD | Freq: Once | CUTANEOUS | Status: DC

## 2020-10-18 MED ORDER — CHLORHEXIDINE GLUCONATE CLOTH 2 % EX PADS
6.0000 | MEDICATED_PAD | Freq: Every day | CUTANEOUS | Status: DC
  Administered 2020-10-18 – 2020-10-23 (×5): 6 via TOPICAL

## 2020-10-18 MED ORDER — SODIUM CHLORIDE 0.9 % IR SOLN
Status: DC | PRN
  Administered 2020-10-18: 2000 mL
  Administered 2020-10-18: 1000 mL

## 2020-10-18 MED ORDER — DEXTROSE 50 % IV SOLN
INTRAVENOUS | Status: AC
  Filled 2020-10-18: qty 50

## 2020-10-18 MED ORDER — ONDANSETRON HCL 4 MG/2ML IJ SOLN
INTRAMUSCULAR | Status: DC | PRN
  Administered 2020-10-18: 4 mg via INTRAVENOUS

## 2020-10-18 MED ORDER — INSULIN ASPART 100 UNIT/ML ~~LOC~~ SOLN
0.0000 [IU] | Freq: Four times a day (QID) | SUBCUTANEOUS | Status: DC
  Administered 2020-10-21 – 2020-10-23 (×4): 3 [IU] via SUBCUTANEOUS

## 2020-10-18 MED ORDER — DIPHENHYDRAMINE HCL 12.5 MG/5ML PO ELIX
12.5000 mg | ORAL_SOLUTION | Freq: Four times a day (QID) | ORAL | Status: DC | PRN
  Administered 2020-10-20 – 2020-10-23 (×9): 12.5 mg via ORAL
  Filled 2020-10-18 (×10): qty 5

## 2020-10-18 MED ORDER — FENTANYL CITRATE (PF) 100 MCG/2ML IJ SOLN
INTRAMUSCULAR | Status: DC | PRN
  Administered 2020-10-18 (×5): 50 ug via INTRAVENOUS

## 2020-10-18 MED ORDER — SODIUM CHLORIDE 0.9 % IV SOLN: INTRAVENOUS | Status: DC | PRN

## 2020-10-18 MED ORDER — CHLORHEXIDINE GLUCONATE 0.12 % MT SOLN
15.0000 mL | Freq: Once | OROMUCOSAL | Status: AC
  Administered 2020-10-18: 15 mL via OROMUCOSAL

## 2020-10-18 MED ORDER — METOPROLOL TARTRATE 25 MG PO TABS
25.0000 mg | ORAL_TABLET | Freq: Two times a day (BID) | ORAL | Status: DC
  Administered 2020-10-18 – 2020-10-23 (×10): 25 mg via ORAL
  Filled 2020-10-18 (×10): qty 1

## 2020-10-18 MED ORDER — SIMETHICONE 80 MG PO CHEW: 40.0000 mg | CHEWABLE_TABLET | Freq: Four times a day (QID) | ORAL | Status: DC | PRN

## 2020-10-18 MED ORDER — SUGAMMADEX SODIUM 200 MG/2ML IV SOLN
INTRAVENOUS | Status: DC | PRN
  Administered 2020-10-18: 200 mg via INTRAVENOUS

## 2020-10-18 MED ORDER — METOPROLOL TARTRATE 5 MG/5ML IV SOLN: 5.0000 mg | Freq: Four times a day (QID) | INTRAVENOUS | Status: DC | PRN

## 2020-10-18 MED ORDER — FENTANYL CITRATE (PF) 100 MCG/2ML IJ SOLN
25.0000 ug | INTRAMUSCULAR | Status: DC | PRN
  Administered 2020-10-18 (×2): 50 ug via INTRAVENOUS
  Filled 2020-10-18: qty 2

## 2020-10-18 MED ORDER — SODIUM CHLORIDE (PF) 0.9 % IJ SOLN
INTRAMUSCULAR | Status: AC
  Filled 2020-10-18: qty 10

## 2020-10-18 MED ORDER — SODIUM CHLORIDE 0.9% IV SOLUTION: Freq: Once | INTRAVENOUS | Status: DC

## 2020-10-18 MED ORDER — ENSURE PRE-SURGERY PO LIQD
592.0000 mL | Freq: Once | ORAL | Status: DC
  Filled 2020-10-18: qty 592

## 2020-10-18 MED ORDER — ENSURE PRE-SURGERY PO LIQD
296.0000 mL | Freq: Once | ORAL | Status: DC
  Filled 2020-10-18: qty 296

## 2020-10-18 MED ORDER — ALBUTEROL SULFATE HFA 108 (90 BASE) MCG/ACT IN AERS
1.0000 | INHALATION_SPRAY | Freq: Four times a day (QID) | RESPIRATORY_TRACT | Status: DC | PRN
  Filled 2020-10-18: qty 6.7

## 2020-10-18 MED ORDER — SODIUM CHLORIDE 0.9 % IV SOLN
2.0000 g | Freq: Two times a day (BID) | INTRAVENOUS | Status: AC
  Administered 2020-10-18 – 2020-10-19 (×3): 2 g via INTRAVENOUS
  Filled 2020-10-18 (×3): qty 2

## 2020-10-18 MED ORDER — METOPROLOL TARTRATE 5 MG/5ML IV SOLN: 5.0000 mg | Freq: Four times a day (QID) | INTRAVENOUS | Status: DC

## 2020-10-18 MED ORDER — ROCURONIUM 10MG/ML (10ML) SYRINGE FOR MEDFUSION PUMP - OPTIME
INTRAVENOUS | Status: DC | PRN
  Administered 2020-10-18 (×2): 20 mg via INTRAVENOUS
  Administered 2020-10-18: 40 mg via INTRAVENOUS

## 2020-10-18 MED ORDER — ALVIMOPAN 12 MG PO CAPS
12.0000 mg | ORAL_CAPSULE | ORAL | Status: AC
  Administered 2020-10-18: 12 mg via ORAL

## 2020-10-18 MED ORDER — FLEET ENEMA 7-19 GM/118ML RE ENEM
1.0000 | ENEMA | Freq: Once | RECTAL | Status: DC
  Filled 2020-10-18: qty 1

## 2020-10-18 MED ORDER — ORAL CARE MOUTH RINSE: 15.0000 mL | Freq: Once | OROMUCOSAL | Status: AC

## 2020-10-18 MED ORDER — ROCURONIUM BROMIDE 10 MG/ML (PF) SYRINGE
PREFILLED_SYRINGE | INTRAVENOUS | Status: AC
  Filled 2020-10-18: qty 10

## 2020-10-18 MED ORDER — HEPARIN SODIUM (PORCINE) 5000 UNIT/ML IJ SOLN
5000.0000 [IU] | Freq: Three times a day (TID) | INTRAMUSCULAR | Status: DC
  Administered 2020-10-19 – 2020-10-21 (×6): 5000 [IU] via SUBCUTANEOUS
  Filled 2020-10-18 (×6): qty 1

## 2020-10-18 MED ORDER — SODIUM CHLORIDE 0.9 % IV SOLN
2.0000 g | INTRAVENOUS | Status: AC
  Administered 2020-10-18: 2 g via INTRAVENOUS

## 2020-10-18 MED ORDER — ACETAMINOPHEN 500 MG PO TABS
1000.0000 mg | ORAL_TABLET | Freq: Four times a day (QID) | ORAL | Status: DC
  Administered 2020-10-18 – 2020-10-23 (×18): 1000 mg via ORAL
  Filled 2020-10-18 (×19): qty 2

## 2020-10-18 MED ORDER — IPRATROPIUM-ALBUTEROL 0.5-2.5 (3) MG/3ML IN SOLN: 3.0000 mL | Freq: Four times a day (QID) | RESPIRATORY_TRACT | Status: DC | PRN

## 2020-10-18 MED ORDER — TAMSULOSIN HCL 0.4 MG PO CAPS
0.4000 mg | ORAL_CAPSULE | Freq: Every day | ORAL | Status: DC
  Administered 2020-10-18 – 2020-10-22 (×5): 0.4 mg via ORAL
  Filled 2020-10-18 (×5): qty 1

## 2020-10-18 MED ORDER — FLUTICASONE-UMECLIDIN-VILANT 200-62.5-25 MCG/INH IN AEPB: 1.0000 | INHALATION_SPRAY | Freq: Every day | RESPIRATORY_TRACT | Status: DC

## 2020-10-18 MED ORDER — ONDANSETRON HCL 4 MG/2ML IJ SOLN: 4.0000 mg | Freq: Once | INTRAMUSCULAR | Status: DC | PRN

## 2020-10-18 MED ORDER — BUPIVACAINE LIPOSOME 1.3 % IJ SUSP
INTRAMUSCULAR | Status: AC
  Filled 2020-10-18: qty 20

## 2020-10-18 MED ORDER — DEXTROSE 50 % IV SOLN
12.5000 g | Freq: Once | INTRAVENOUS | Status: AC
  Administered 2020-10-18: 12.5 g via INTRAVENOUS

## 2020-10-18 MED ORDER — FENTANYL CITRATE (PF) 100 MCG/2ML IJ SOLN
INTRAMUSCULAR | Status: AC
  Filled 2020-10-18: qty 2

## 2020-10-18 MED ORDER — EPHEDRINE 5 MG/ML INJ
INTRAVENOUS | Status: AC
  Filled 2020-10-18: qty 10

## 2020-10-18 MED ORDER — ACETAMINOPHEN 500 MG PO TABS
1000.0000 mg | ORAL_TABLET | ORAL | Status: AC
  Administered 2020-10-18: 1000 mg via ORAL

## 2020-10-18 MED ORDER — METOCLOPRAMIDE HCL 5 MG/ML IJ SOLN: 10.0000 mg | Freq: Four times a day (QID) | INTRAMUSCULAR | Status: DC | PRN

## 2020-10-18 MED ORDER — KETOROLAC TROMETHAMINE 15 MG/ML IJ SOLN: 15.0000 mg | Freq: Four times a day (QID) | INTRAMUSCULAR | Status: DC | PRN

## 2020-10-18 MED ORDER — DIPHENHYDRAMINE HCL 50 MG/ML IJ SOLN
12.5000 mg | Freq: Four times a day (QID) | INTRAMUSCULAR | Status: DC | PRN
  Administered 2020-10-20 – 2020-10-21 (×2): 12.5 mg via INTRAVENOUS
  Filled 2020-10-18 (×2): qty 1

## 2020-10-18 SURGICAL SUPPLY — 50 items
CLOTH BEACON ORANGE TIMEOUT ST (SAFETY) ×2 IMPLANT
COVER LIGHT HANDLE STERIS (MISCELLANEOUS) ×4 IMPLANT
COVER WAND RF STERILE (DRAPES) ×2 IMPLANT
DRSG OPSITE POSTOP 4X10 (GAUZE/BANDAGES/DRESSINGS) ×2 IMPLANT
DRSG TEGADERM 4X4.75 (GAUZE/BANDAGES/DRESSINGS) ×2 IMPLANT
ELECT REM PT RETURN 9FT ADLT (ELECTROSURGICAL) ×2
ELECTRODE REM PT RTRN 9FT ADLT (ELECTROSURGICAL) ×1 IMPLANT
GAUZE SPONGE 4X4 12PLY STRL (GAUZE/BANDAGES/DRESSINGS) ×2 IMPLANT
GLOVE BIO SURGEON STRL SZ 6.5 (GLOVE) ×4 IMPLANT
GLOVE BIO SURGEON STRL SZ7 (GLOVE) ×4 IMPLANT
GLOVE BIO SURGEON STRL SZ8 (GLOVE) ×2 IMPLANT
GLOVE BIOGEL PI IND STRL 6.5 (GLOVE) ×2 IMPLANT
GLOVE BIOGEL PI IND STRL 7.0 (GLOVE) ×3 IMPLANT
GLOVE BIOGEL PI INDICATOR 6.5 (GLOVE) ×2
GLOVE BIOGEL PI INDICATOR 7.0 (GLOVE) ×3
GLOVE SURG SS PI 7.5 STRL IVOR (GLOVE) ×4 IMPLANT
GOWN STRL REUS W/TWL LRG LVL3 (GOWN DISPOSABLE) ×12 IMPLANT
INST SET MAJOR GENERAL (KITS) ×2 IMPLANT
KIT TURNOVER KIT A (KITS) ×2 IMPLANT
LIGASURE IMPACT 36 18CM CVD LR (INSTRUMENTS) ×2 IMPLANT
MANIFOLD NEPTUNE II (INSTRUMENTS) ×2 IMPLANT
NEEDLE HYPO 18GX1.5 BLUNT FILL (NEEDLE) ×2 IMPLANT
NS IRRIG 1000ML POUR BTL (IV SOLUTION) ×6 IMPLANT
PACK COLON (CUSTOM PROCEDURE TRAY) ×2 IMPLANT
PAD ARMBOARD 7.5X6 YLW CONV (MISCELLANEOUS) ×2 IMPLANT
PENCIL SMOKE EVACUATOR COATED (MISCELLANEOUS) ×2 IMPLANT
RELOAD PROXIMATE 75MM BLUE (ENDOMECHANICALS) ×10 IMPLANT
RETRACTOR WND ALEXIS-O 25 LRG (MISCELLANEOUS) ×1 IMPLANT
RTRCTR WOUND ALEXIS O 25CM LRG (MISCELLANEOUS) ×2
SHEET LAVH (DRAPES) ×2 IMPLANT
SPONGE LAP 18X18 RF (DISPOSABLE) ×8 IMPLANT
STAPLER GUN LINEAR PROX 60 (STAPLE) ×4 IMPLANT
STAPLER PROXIMATE 75MM BLUE (STAPLE) ×2 IMPLANT
STAPLER VISISTAT (STAPLE) ×2 IMPLANT
SUT CHROMIC 2 0 SH (SUTURE) ×2 IMPLANT
SUT PDS AB CT VIOLET #0 27IN (SUTURE) ×10 IMPLANT
SUT PROLENE 2 0 SH 30 (SUTURE) ×2 IMPLANT
SUT SILK 0 FSL (SUTURE) ×2 IMPLANT
SUT SILK 3 0 SH CR/8 (SUTURE) ×8 IMPLANT
SUT VIC AB 0 CT1 27 (SUTURE) ×4
SUT VIC AB 0 CT1 27XCR 8 STRN (SUTURE) ×4 IMPLANT
SUT VIC AB 3-0 SH 27 (SUTURE) ×1
SUT VIC AB 3-0 SH 27X BRD (SUTURE) ×1 IMPLANT
SWAB CULTURE ESWAB REG 1ML (MISCELLANEOUS) ×2 IMPLANT
SWAB CULTURE LIQ STUART DBL (MISCELLANEOUS) ×2 IMPLANT
SYR BULB IRRIG 60ML STRL (SYRINGE) ×2 IMPLANT
TOWEL SURG RFD BLUE STRL DISP (DISPOSABLE) IMPLANT
TRAY FOLEY W/BAG SLVR 16FR (SET/KITS/TRAYS/PACK) ×1
TRAY FOLEY W/BAG SLVR 16FR ST (SET/KITS/TRAYS/PACK) ×1 IMPLANT
YANKAUER SUCT BULB TIP 10FT TU (MISCELLANEOUS) ×4 IMPLANT

## 2020-10-18 NOTE — Op Note (Signed)
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 Constance Haw, MD   Assistants: Aviva Signs, MD    Anesthesia: General endotracheal   Anesthesiologist: Louann Sjogren, MD    Specimens: Small bowel with area of fat necrosis, culture of cavity of fat necrosis, end colostomy   Estimated Blood Loss: 100cc    Blood Replacement: None    Complications: None   Wound Class: Clean contaminated    Operative Indications: Mr. 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 He had unfortunately developed skin stenosis of the ostomy site, and was having to be manually digitally dilated weekly. He developed COVID prior to get his reversal and the reversal and colonoscopy had to be delayed.  He had a colonoscopy that revealed no deep stenosis and no concerning pathology. He is about 6-8 weeks out from his COVID infection and without sequela.  We discussed colostomy reversal and the risk of bleeding, infection, anastomotic leak, injury to other organs, and his cardiac history and need for aggressive monitoring. He expressed understanding and was ready to get his ostomy reversed.   Findings: Stenotic ostomy at the skin level, small bowel adhesions, small bowel adhesions to the anterior abdominal wall, fat necrosis with caseous fat and cavity adherent to bowel, fat necrosis and caseous fat to the end colostomy at the fascia level    Procedure: The patient was taken to the operating room and placed supine General endotracheal anesthesia was induced. An arterial line was placed. Intravenous antibiotics were administered per protocol.  A foley catheter was placed. An orogastric tube positioned to decompress the stomach. The patient was then placed in lithotomy with all pressure points padded. The ostomy site was closed with 2-0 Nylon suture in a running fashion to prevent contamination.  The abdomen and perineum were prepared and draped in the usual sterile fashion.   The prior incision was opened and carried down through to the fascia carefully. Omental adhesions were taken down, and in there lower edge of the incision adhesions of small bowel with a hardened mass were adherent to the anterior abdominal wall. Care was taken to try and lysis the adhesions with scissors but this hardened mass was so adherent to the small bowel it would not be able to be dissected free. From here the incision was opened and this hardened area appeared to be a cavity of caseous fat with necrosis and fluid inside that was greasy in appearance consistent with fat necrosis. Cultures were obtained.  Towel were placed. This area of hardened fat necrosis was left on the small bowel which was resected in the standard fashion with 75 mm linear cutting stapler X 2. A side to side anastomosis was performed with a 75 mm linear cutting stapler and the common enterotomy was closed with a TA 60. Two crotch sutures were placed with 3-0 silk interrupted and the TA staple line was oversewn with lembert 3-0 Silk suture. The mesentery was closed with  2-0 chromic gut.   The incision was opened entirely and additional small bowel adhesions around the end colostomy were taken down with care with scissor dissection.  The ostomy was able to be encircled and there again was a hard mass at the edge of the fascia where the end colostomy existed to the subcutaneous tissue and felt much  like the other area.  The remaining small bowel was healthy and viable. One small serosal tear was oversewn with 3-0 Lembert silk suture.   The small bowel was tucked in the right upper quadrant. A wound protector was placed.   The stump was long with sigmoid still present, and this was freed up from the left pelvis and side wall with electrocautery. No retroperitoneal structures were disturbed including the ureter. The mesentery of the sigmoid colon and rectosigmoid junction was scored to allow for more length which was well over the pelvic brim and would allow for a side to side anastomosis.   From here the colostomy site was ellipsed out with a knife and carried down the fascia with blunt and cautery dissection. The anterior fascia was intact but the posterior fascia had split some, but there was no parastomal hernia. The ostomy was delivered into the abdomen and the hardened mass that was adjacent was resected from the anterior abdominal wall and left with the end colostomy portion as it was adherent to the colon. Given this the proximal colon was resected with a linear cutting stapler and the end colostomy was passed off the field.  This area of the colon was mid transverse colon and was healthy and viable. There was sufficient length to bring it down to the stump without tension for a side to side anastomosis.   Towels were placed. Colotomies were made and a side to side anastomosis was made with the 75 mm linear cutting stapler. The common colotomy was closed with the TA 60. Two crotch sutures were placed with 3-0 Silk, and the TA staple line was oversewn with 3-0 Lembert silk suture. Epiploic fat around the colon was draped over the anastomosis and secured with 3-0 Silk suture. The colon laid without tension or twisting. The small bowel was unpacked and draped in the abdomen untwisted. There was minimal to no omentum remaining to drape over the small bowel. The abdomen was irrigated. Hemostasis was  confirmed. The OG was changed to an NG and placement was manually confirmed.   The posterior fascia that was split at the ostomy site was closed intraperitoneal with 0 Vicryl interrupted suture.   The entire team then changed gown and gloves and new closing equipment was used. The anterior fascia of the ostomy site was closed with interrupted 0 Vicryl suture. The space was irrigated, and the fat was closed with 3-0 Interrupted Vicryl.   The midline was closed with 0 PDS suture in the standard running fashion. The skin of the ostomy site was closed loosely and a gauze and Tegaderm were placed. The midline was closed with staples and a honeycomb dressing was placed.   Dr. Arnoldo Morale was assisting throughout the procedure and was present for the critical portions of the case.   Final inspection revealed acceptable hemostasis. All counts were correct at the end of the case. The patient was awakened from anesthesia and extubated without complication.  The patient went to the PACU in stable condition.   [name], MD [facility] [address] [place], [place] [address] [phone] (office)

## 2020-10-18 NOTE — Progress Notes (Signed)
[facility]  Notified [name] that surgery done.   Colostomy reversal and small bowel resection.   PRN for pain, scheduled tylenol po  IS, OOB, COPD meds ordered  Telemetry, Stepdown unit, lopressor 5 q6 scheduled, diltiazem ordered po  Arterial line in place and will keep for now given cardiac history  NPO, NG in place given SBR, ok to hold suction with meds for now SSI resistant ordered  Abdominal binder  Foley overnight, will hopefully get out in AM Labs in AM SCDs, heparin sq starting tomorrow  PICC to be placed given poor access and difficulty with IV, only 1 IV in now and anesthesia unable to place another, will likely be in house 1 week Hospitalist consulted to help with medical management   [name], MD [facility] [place],  [phone] (office)

## 2020-10-18 NOTE — Anesthesia Postprocedure Evaluation (Signed)
Anesthesia Post Note  Patient: Vincent Cooper  Procedure(s) Performed: COLOSTOMY REVERSAL (N/A Abdomen) SMALL BOWEL RESECTION (N/A Abdomen)  Patient location during evaluation: PACU Anesthesia Type: General Level of consciousness: awake and alert and oriented Pain management: pain level controlled Vital Signs Assessment: post-procedure vital signs reviewed and stable Respiratory status: spontaneous breathing Cardiovascular status: blood pressure returned to baseline and stable Postop Assessment: no apparent nausea or vomiting Anesthetic complications: no   No complications documented.   Last Vitals:  Vitals:   10/18/20 0715 10/18/20 0730  BP: 109/69   Pulse: (!) 56 (!) 53  Resp: 16 14  Temp:    SpO2: 96% 93%    Last Pain:  Vitals:   10/18/20 0708  TempSrc: Oral  PainSc: 0-No pain                 Raigen Jagielski

## 2020-10-18 NOTE — Transfer of Care (Signed)
Immediate Anesthesia Transfer of Care Note  Patient: Vincent Cooper  Procedure(s) Performed: COLOSTOMY REVERSAL (N/A Abdomen) SMALL BOWEL RESECTION (N/A Abdomen)  Patient Location: PACU  Anesthesia Type:General  Level of Consciousness: awake  Airway & Oxygen Therapy: Patient Spontanous Breathing and Patient connected to face mask oxygen  Post-op Assessment: Report given to RN  Post vital signs: Reviewed and stable  Last Vitals:  Vitals Value Taken Time  BP 95/50 10/18/20 1145  Temp    Pulse 59 10/18/20 1146  Resp 15 10/18/20 1146  SpO2 100 % 10/18/20 1146  Vitals shown include unvalidated device data.  Last Pain:  Vitals:   10/18/20 0708  TempSrc: Oral  PainSc: 0-No pain      Patients Stated Pain Goal: 5 (63/78/58 8502)  Complications: No complications documented.

## 2020-10-18 NOTE — Anesthesia Preprocedure Evaluation (Signed)
Anesthesia Evaluation  Patient identified by MRN, date of birth, ID band Patient awake    Reviewed: Allergy & Precautions, H&P , NPO status , Patient's Chart, lab work & pertinent test results, reviewed documented beta blocker date and time   Airway Mallampati: II  TM Distance: >3 FB Neck ROM: full    Dental no notable dental hx.    Pulmonary shortness of breath, sleep apnea and Continuous Positive Airway Pressure Ventilation , COPD, former smoker,    Pulmonary exam normal breath sounds clear to auscultation       Cardiovascular Exercise Tolerance: Good hypertension, + CAD   Rhythm:regular Rate:Normal     Neuro/Psych negative neurological ROS  negative psych ROS   GI/Hepatic negative GI ROS, Neg liver ROS,   Endo/Other  negative endocrine ROSdiabetes  Renal/GU Renal disease  negative genitourinary   Musculoskeletal   Abdominal   Peds  Hematology negative hematology ROS (+)   Anesthesia Other Findings   Reproductive/Obstetrics negative OB ROS                             Anesthesia Physical Anesthesia Plan  ASA: III  Anesthesia Plan: General   Post-op Pain Management:    Induction: Intravenous  PONV Risk Score and Plan: Ondansetron  Airway Management Planned: Oral ETT  Additional Equipment: Arterial line  Intra-op Plan:   Post-operative Plan:   Informed Consent: I have reviewed the patients History and Physical, chart, labs and discussed the procedure including the risks, benefits and alternatives for the proposed anesthesia with the patient or authorized representative who has indicated his/her understanding and acceptance.     Dental Advisory Given  Plan Discussed with: CRNA  Anesthesia Plan Comments:         Anesthesia Quick Evaluation

## 2020-10-18 NOTE — Anesthesia Procedure Notes (Signed)
Arterial Line Insertion Start/End12/11/2019 7:41 AM, 10/18/2020 7:44 AM Performed by: Louann Sjogren, MD, anesthesiologist  Patient location: OR. Preanesthetic checklist: patient identified, IV checked, site marked, risks and benefits discussed, surgical consent, monitors and equipment checked, pre-op evaluation and timeout performed Patient sedated radial was placed Catheter size: 20 G Hand hygiene performed , maximum sterile barriers used  and Seldinger technique used Allen's test indicative of satisfactory collateral circulation Attempts: 1 Procedure performed without using ultrasound guided technique. Following insertion, dressing applied. Post procedure assessment: normal  Patient tolerated the procedure well with no immediate complications.

## 2020-10-18 NOTE — Progress Notes (Addendum)
Pt has NG tube in place... CPAP not set up at this time RT will continue to monitor throughout the night spo2 93% on room air

## 2020-10-18 NOTE — Consult Note (Addendum)
Medical Consultation   Vincent Cooper  WER:[id number]  DOB: Jul 13, 1941  DOA: 10/18/2020  PCP: Manon Hilding, MD   Requesting physician: Dr. Constance Haw  Reason for consultation: Medical Management  History of Present Illness: Vincent Cooper is an 79 y.o. 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  He has medical problems to include chronic gout, prior PE on Eliquis, COPD, CAD, type 2 diabetes-well controlled, hypertension, OSA on CPAP, BPH, and obesity.  He appears to have tolerated the procedure well and is currently n.p.o except for medications.  He denies any significant pain currently and is otherwise resting comfortably.  We have been asked to consult for medical management during his hospitalization.  He is currently n.p.o. with NG tube to low intermittent suction.   Review of Systems:  ROS All others reviewed as noted above and otherwise negative.   Past Medical History: Past Medical History:  Diagnosis Date  . Chronic gout   . COPD (chronic obstructive pulmonary disease) ([place])    Oxygen dependent  . 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 CAD-RADS 4 - Severe.  ? >50% LM stenosis -->CTFFR -- NOT PHYSIOLOGICALLY SIGNIFICANT; Large Dom RCA-<PDA/PLA.  Diffuse mild to mod plaque (25-49%) prox segment and mod (50-69%) mid-distal;  CTFFR: prox 0.95, mid 0.88, distal 0.86 (not significant); Med-sized LAD w/ long diffuse mod-severe plaque calcified plaque in prox-mid ~ 50 -69%, ? >70% -> CTFFR :   . Diabetes mellitus type II, non insulin dependent ([place])   . Hypertension   . Morbid obesity (HCC)    BMI of 38.5 with multiple risk factors.  . OSA on CPAP   . Pulmonary emboli ([place]) 10/2019   Chest CTA-4 Phadke opacification of main PA but  there is partially occlusive main posterior RLL and segmental/segmental branches.  Small thrombus noted in the anterior right middle lobe.  No RV strain.  Scattered aortic atherosclerosis involving great vessels.  Coronary calcification noted.  Marland Kitchen RLS (restless legs syndrome)     Past Surgical History: Past Surgical History:  Procedure Laterality Date  . Cataract surgery Right   . COLONOSCOPY WITH PROPOFOL N/A 09/27/2020   Procedure: COLONOSCOPY WITH PROPOFOL;  Surgeon: Rogene Houston, MD;  Location: [facility];  Service: Endoscopy;  Laterality: N/A;  1:15  . LAPAROTOMY N/A 06/21/2020   Procedure: EXPLORATORY LAPAROTOMY,  bowel resection, creation ostomy;  Surgeon: Virl Cagey, MD;  Location: [facility] ORS;  Service: General;  Laterality: N/A;  . POLYPECTOMY  09/27/2020   Procedure: POLYPECTOMY;  Surgeon: Rogene Houston, MD;  Location: [facility];  Service: Endoscopy;;     Allergies:   Allergies  Allergen Reactions  . Other     Patient reports he was allergic to something in an IV he was given but does not know what the substance was. As of 01/19/2020      Social History:  reports that he has quit smoking. He has quit using smokeless tobacco. He reports previous alcohol use. He reports that he does not use drugs.   Family History: Family History  Problem Relation Age of Onset  . Colon cancer Mother   . Emphysema Father   . Diverticulitis Sister   . Diabetes Brother   .  Heart Problems Maternal Grandmother   . Diabetes Brother   . Lupus Son   . Breast cancer Daughter   . CAD Neg Hx     Physical Exam: Vitals:   10/18/20 1400 10/18/20 1430 10/18/20 1445 10/18/20 1515  BP: (!) 116/59 119/60 120/67 118/67  Pulse: 65 61 62 66  Resp: (!) 22 11 12 17   Temp:    98.2 F (36.8 C)  TempSrc:      SpO2: 92% 100% 100% 100%    Constitutional: Alert and awake, oriented x3, not in any acute distress. Eyes: No acute findings CVS: S1-S2 clear, no murmur rubs or gallops, no  LE edema, normal pedal pulses  Respiratory:  clear to auscultation bilaterally, no wheezing, rales or rhonchi. Respiratory effort normal. No accessory muscle use.  Abdomen: Abdominal binder present Musculoskeletal: : SCDs present bilaterally Psych: judgement and insight appear normal, stable mood and affect, mental status Skin: no rashes or lesions or ulcers, no induration or nodules   Data reviewed:  I have personally reviewed following labs and imaging studies Labs:  CBC: Recent Labs  Lab 10/16/20 0950  WBC 6.3  NEUTROABS 3.7  HGB 11.8*  HCT 38.3*  MCV 95.5  PLT 295    Basic Metabolic Panel: Recent Labs  Lab 10/16/20 0950  NA 137  K 4.0  CL 104  CO2 26  GLUCOSE 81  BUN 20  CREATININE 1.12  CALCIUM 9.0   GFR Estimated Creatinine Clearance: 66.1 mL/min (by C-G formula based on SCr of 1.12 mg/dL). Liver Function Tests: Recent Labs  Lab 10/16/20 0950  AST 24  ALT 28  ALKPHOS 40  BILITOT 0.7  PROT 6.3*  ALBUMIN 3.4*   No results for input(s): LIPASE, AMYLASE in the last 168 hours. No results for input(s): AMMONIA in the last 168 hours. Coagulation profile No results for input(s): INR, PROTIME in the last 168 hours.  Cardiac Enzymes: No results for input(s): CKTOTAL, CKMB, CKMBINDEX, TROPONINI in the last 168 hours. BNP: Invalid input(s): POCBNP CBG: Recent Labs  Lab 10/18/20 0632 10/18/20 0728 10/18/20 1015 10/18/20 1208 10/18/20 1555  GLUCAP 76 94 124* 142* 162*   D-Dimer No results for input(s): DDIMER in the last 72 hours. Hgb A1c Recent Labs    10/16/20 0950  HGBA1C 4.7*   Lipid Profile No results for input(s): CHOL, HDL, LDLCALC, TRIG, CHOLHDL, LDLDIRECT in the last 72 hours. Thyroid function studies No results for input(s): TSH, T4TOTAL, T3FREE, THYROIDAB in the last 72 hours.  Invalid input(s): FREET3 Anemia work up No results for input(s): VITAMINB12, FOLATE, FERRITIN, TIBC, IRON, RETICCTPCT in the last 72 hours. Urinalysis      Component Value Date/Time   COLORURINE YELLOW 06/21/2020 1229   APPEARANCEUR HAZY (A) 06/21/2020 1229   LABSPEC 1.014 06/21/2020 1229   PHURINE 5.0 06/21/2020 1229   GLUCOSEU 50 (A) 06/21/2020 1229   HGBUR MODERATE (A) 06/21/2020 1229   BILIRUBINUR NEGATIVE 06/21/2020 1229   KETONESUR 5 (A) 06/21/2020 1229   PROTEINUR 30 (A) 06/21/2020 1229   NITRITE NEGATIVE 06/21/2020 1229   LEUKOCYTESUR NEGATIVE 06/21/2020 1229     Microbiology Recent Results (from the past 240 hour(s))  SARS CORONAVIRUS 2 (TAT 6-24 HRS) Nasopharyngeal Nasopharyngeal Swab     Status: None   Collection Time: 10/16/20  9:00 AM   Specimen: Nasopharyngeal Swab  Result Value Ref Range Status   SARS Coronavirus 2 NEGATIVE NEGATIVE Final    Comment: (NOTE) SARS-CoV-2 target nucleic acids are NOT DETECTED.  The SARS-CoV-2  RNA is generally detectable in upper and lower respiratory specimens during the acute phase of infection. Negative results do not preclude SARS-CoV-2 infection, do not rule out co-infections with other pathogens, and should not be used as the sole basis for treatment or other patient management decisions. Negative results must be combined with clinical observations, patient history, and epidemiological information. The expected result is Negative.  Fact Sheet for Patients: SugarRoll.be  Fact Sheet for Healthcare Providers: https://www.woods-mathews.com/  This test is not yet approved or cleared by the [place] FDA and  has been authorized for detection and/or diagnosis of SARS-CoV-2 by FDA under an Emergency Use Authorization (EUA). This EUA will remain  in effect (meaning this test can be used) for the duration of the COVID-19 declaration under Se ction 564(b)(1) of the Act, 21 U.S.C. section 360bbb-3(b)(1), unless the authorization is terminated or revoked sooner.  Performed at [facility], [place] [address], [place],  [place] [address]        Inpatient Medications:   Scheduled Meds: . sodium chloride   Intravenous Once  . acetaminophen  1,000 mg Oral Q6H  . [START ON 10/19/2020] diltiazem  360 mg Oral Daily  . fluticasone furoate-vilanterol  1 puff Inhalation Daily  . [START ON 10/19/2020] heparin injection (subcutaneous)  5,000 Units Subcutaneous Q8H  . insulin aspart  0-20 Units Subcutaneous Q4H  . metoprolol tartrate  5 mg Intravenous Q6H  . tamsulosin  0.4 mg Oral QHS  . umeclidinium bromide  1 puff Inhalation Daily   Continuous Infusions: . sodium chloride    . cefoTEtan (CEFOTAN) IV    . lactated ringers 75 mL/hr at 10/18/20 1324     Radiological Exams on Admission: DG Chest Port 1 View  Result Date: 10/18/2020 CLINICAL DATA:  79 year old male status post NG placement. EXAM: PORTABLE CHEST 1 VIEW COMPARISON:  Chest radiograph dated 08/30/2020. FINDINGS: Enteric tube with tip and side-port in the proximal stomach. There is mild cardiomegaly with mild vascular congestion. Left lung base density, likely atelectasis. A trace left pleural effusion is not excluded. No pneumothorax. Atherosclerotic calcification of the aorta. No acute osseous pathology. IMPRESSION: 1. Enteric tube with tip and side-port in the proximal stomach. 2. Cardiomegaly with mild vascular congestion. Electronically Signed   By: Anner Crete M.D.   On: 10/18/2020 15:36   [place] EKG SITE RITE  Result Date: 10/18/2020 If Site Rite image not attached, placement could not be confirmed due to current cardiac rhythm.   Impression/Recommendations Principal Problem:   Colostomy in place [facility]) Active Problems:   Colostomy stenosis (HCC)  Colostomy reversal with side-to-side anastomosis -Dietary advancement and surgical management per primary team  History of type 2 diabetes -Patient noted to be on SSI which is appropriate while n.p.o. -Recent hemoglobin A1c 4.7% on 10/16/2020 -Holding home glimepiride for now which is  appropriate -Hold long-acting insulin until diet further advanced  History of hypertension/CAD -Blood pressures currently controlled -Continue metoprolol and diltiazem as ordered with good heart rate control noted -Appropriate to hold Crestor for now as well as torsemide until diet further advanced  History of COPD -Patient does not wear home oxygen -Use breathing treatments as needed for shortness of breath or wheezing as ordered -No acute bronchospasms currently noted  History of BPH -Continue tamsulosin  History of PE -Eliquis currently held for procedure  Morbid obesity/OSA -Lifestyle changes outpatient -Uses CPAP at night which I will reorder  Thank you for this consultation.  Our [facility] hospitalist team will follow  the patient with you.   Time Spent: 35 minutes  Dontell Mian D Camrie Stock DO [facility] 10/18/2020, 4:20 PM

## 2020-10-18 NOTE — Interval H&P Note (Signed)
History and Physical Interval Note:  10/18/2020 7:21 AM  Vincent Cooper  has presented today for surgery, with the diagnosis of Colostomy in place.  The various methods of treatment have been discussed with the patient and family. After consideration of risks, benefits and other options for treatment, the patient has consented to  Procedure(s): COLOSTOMY REVERSAL (N/A) as a surgical intervention.  The patient's history has been reviewed, patient examined, no change in status, stable for surgery.  I have reviewed the patient's chart and labs.  Questions were answered to the patient's satisfaction.    Patient aware of possible central line, plan for arterial line. Virl Cagey

## 2020-10-18 NOTE — Progress Notes (Signed)
Spoke with unit nurse regarding PICC order, made aware that PICC will be placed 12-2.  Per RN this is okay.

## 2020-10-18 NOTE — Anesthesia Procedure Notes (Signed)
Procedure Name: Intubation Date/Time: 10/18/2020 7:47 AM Performed by: Ollen Bowl, CRNA Pre-anesthesia Checklist: Patient identified, Patient being monitored, Timeout performed, Emergency Drugs available and Suction available Patient Re-evaluated:Patient Re-evaluated prior to induction Oxygen Delivery Method: Circle system utilized Preoxygenation: Pre-oxygenation with 100% oxygen Induction Type: IV induction Ventilation: Mask ventilation without difficulty Laryngoscope Size: Glidescope (S3) Grade View: Grade I Tube type: Oral Tube size: 7.0 mm Number of attempts: 1 Airway Equipment and Method: Stylet and Rigid stylet Placement Confirmation: ETT inserted through vocal cords under direct vision,  positive ETCO2 and breath sounds checked- equal and bilateral Secured at: 22 cm Tube secured with: Tape Dental Injury: Teeth and Oropharynx as per pre-operative assessment

## 2020-10-19 ENCOUNTER — Encounter (HOSPITAL_COMMUNITY): Payer: Self-pay | Admitting: General Surgery

## 2020-10-19 ENCOUNTER — Inpatient Hospital Stay (HOSPITAL_COMMUNITY): Payer: Medicare PPO

## 2020-10-19 DIAGNOSIS — Z933 Colostomy status: Secondary | ICD-10-CM | POA: Diagnosis not present

## 2020-10-19 LAB — GLUCOSE, CAPILLARY
Glucose-Capillary: 106 mg/dL — ABNORMAL HIGH (ref 70–99)
Glucose-Capillary: 107 mg/dL — ABNORMAL HIGH (ref 70–99)
Glucose-Capillary: 67 mg/dL — ABNORMAL LOW (ref 70–99)
Glucose-Capillary: 80 mg/dL (ref 70–99)
Glucose-Capillary: 88 mg/dL (ref 70–99)
Glucose-Capillary: 94 mg/dL (ref 70–99)

## 2020-10-19 LAB — BASIC METABOLIC PANEL
Anion gap: 8 (ref 5–15)
BUN: 17 mg/dL (ref 8–23)
CO2: 23 mmol/L (ref 22–32)
Calcium: 8.4 mg/dL — ABNORMAL LOW (ref 8.9–10.3)
Chloride: 104 mmol/L (ref 98–111)
Creatinine, Ser: 0.99 mg/dL (ref 0.61–1.24)
GFR, Estimated: >60 mL/min (ref >60)
Glucose, Bld: 129 mg/dL — ABNORMAL HIGH (ref 70–99)
Potassium: 4.9 mmol/L (ref 3.5–5.1)
Sodium: 135 mmol/L (ref 135–145)

## 2020-10-19 LAB — CBC WITH DIFFERENTIAL/PLATELET
Abs Immature Granulocytes: 0.03 10*3/uL (ref 0.00–0.07)
Basophils Absolute: 0 10*3/uL (ref 0.0–0.1)
Basophils Relative: 0 %
Eosinophils Absolute: 0 10*3/uL (ref 0.0–0.5)
Eosinophils Relative: 0 %
HCT: 37.4 % — ABNORMAL LOW (ref 39.0–52.0)
Hemoglobin: 11.6 g/dL — ABNORMAL LOW (ref 13.0–17.0)
Immature Granulocytes: 0 %
Lymphocytes Relative: 11 %
Lymphs Abs: 0.9 10*3/uL (ref 0.7–4.0)
MCH: 29.6 pg (ref 26.0–34.0)
MCHC: 31 g/dL (ref 30.0–36.0)
MCV: 95.4 fL (ref 80.0–100.0)
Monocytes Absolute: 0.8 10*3/uL (ref 0.1–1.0)
Monocytes Relative: 10 %
Neutro Abs: 6.9 10*3/uL (ref 1.7–7.7)
Neutrophils Relative %: 79 %
Platelets: 202 10*3/uL (ref 150–400)
RBC: 3.92 MIL/uL — ABNORMAL LOW (ref 4.22–5.81)
RDW: 17.3 % — ABNORMAL HIGH (ref 11.5–15.5)
WBC: 8.8 10*3/uL (ref 4.0–10.5)
nRBC: 0 % (ref 0.0–0.2)

## 2020-10-19 LAB — PHOSPHORUS: Phosphorus: 5 mg/dL — ABNORMAL HIGH (ref 2.5–4.6)

## 2020-10-19 LAB — MAGNESIUM: Magnesium: 1.7 mg/dL (ref 1.7–2.4)

## 2020-10-19 MED ORDER — SODIUM CHLORIDE 0.9% FLUSH
10.0000 mL | INTRAVENOUS | Status: DC | PRN
  Administered 2020-10-19: 10 mL

## 2020-10-19 MED ORDER — DEXTROSE 50 % IV SOLN
25.0000 mL | Freq: Once | INTRAVENOUS | Status: AC
  Administered 2020-10-19: 25 mL via INTRAVENOUS
  Filled 2020-10-19: qty 50

## 2020-10-19 MED ORDER — SODIUM CHLORIDE 0.9% FLUSH
10.0000 mL | Freq: Two times a day (BID) | INTRAVENOUS | Status: DC
  Administered 2020-10-19 – 2020-10-23 (×8): 10 mL

## 2020-10-19 NOTE — Progress Notes (Signed)
Peripherally Inserted Central Catheter Placement  The IV Nurse has discussed with the patient and/or persons authorized to consent for the patient, the purpose of this procedure and the potential benefits and risks involved with this procedure.  The benefits include less needle sticks, lab draws from the catheter, and the patient may be discharged home with the catheter. Risks include, but not limited to, infection, bleeding, blood clot (thrombus formation), and puncture of an artery; nerve damage and irregular heartbeat and possibility to perform a PICC exchange if needed/ordered by physician.  Alternatives to this procedure were also discussed.  Bard Power PICC patient education guide, fact sheet on infection prevention and patient information card has been provided to patient /or left at bedside.    PICC Placement Documentation  PICC Double Lumen 10/19/20 PICC Right Cephalic 46 cm 0 cm (Active)  Indication for Insertion or Continuance of Line Prolonged intravenous therapies 10/19/20 1310  Exposed Catheter (cm) 0 cm 10/19/20 1310  Site Assessment Clean;Dry;Intact 10/19/20 1310  Lumen #1 Status Blood return noted;Flushed;Saline locked 10/19/20 1310  Lumen #2 Status Blood return noted;Flushed;Saline locked 10/19/20 1310  Dressing Type Transparent 10/19/20 1310  Dressing Status Clean;Dry;Intact 10/19/20 1310  Antimicrobial disc in place? Yes 10/19/20 1310  Safety Lock Not Applicable 56/97/94 8016  Line Care Connections checked and tightened 10/19/20 1310  Dressing Change Due 10/26/20 10/19/20 1310       Jeanice Lim M 10/19/2020, 1:37 PM

## 2020-10-19 NOTE — Progress Notes (Signed)
PICC line terminates in SVC per radiology report. Randall Hiss, RN, notified PICC line is ready to use.

## 2020-10-19 NOTE — Progress Notes (Signed)
PROGRESS NOTE    Vincent Cooper  MWU:[id number] DOB: 05/02/1941 DOA: 10/18/2020 PCP: Manon Hilding, MD   Brief Narrative:  Vincent Cooper is an 79 y.o. 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  He has medical problems to include chronic gout, prior PE on Eliquis, COPD, CAD, type 2 diabetes-well controlled, hypertension, OSA on CPAP, BPH, and obesity.  He appears to have tolerated the procedure well and is currently n.p.o except for medications.  He denies any significant pain currently and is otherwise resting comfortably.  We have been asked to consult for medical management during his hospitalization.  He is currently n.p.o. with NG tube to low intermittent suction.  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 -Recent hemoglobin A1c 4.7% on 10/16/2020 -Holding home glimepiride for now which is appropriate -Hold long-acting insulin until diet further advanced -BG well controlled  History of hypertension/CAD -Blood pressures currently controlled -Continue metoprolol and diltiazem as ordered with good heart rate control noted -Appropriate to hold Crestor for now as well as torsemide until diet further advanced -Questionable if FB is +5L, but no resp distress noted  History of COPD -Patient does not wear home oxygen -Use breathing treatments as needed for shortness of breath or wheezing as ordered -No acute bronchospasms currently noted  History of BPH -Continue tamsulosin  History of PE -Eliquis  currently held for procedure -May resume when ok with primary team  Morbid obesity/OSA -Lifestyle changes outpatient -Uses CPAP at night which has been reordered   DVT prophylaxis:Heparin Code Status: Full Family Communication: Discussed with daughter at bedside 12/1 Disposition Plan:  Status is: Inpatient  Remains inpatient appropriate because:IV treatments appropriate due to intensity of illness or inability to take PO and Inpatient level of care appropriate due to severity of illness   Dispo: The patient is from: Home              Anticipated d/c is to: Home              Anticipated d/c date is: 2 days              Patient currently is not medically stable to d/c.  Procedures:   POD #1  Antimicrobials:  Anti-infectives (From admission, onward)   Start     Dose/Rate Route Frequency Ordered Stop   10/18/20 2200  cefoTEtan (CEFOTAN) 2 g in sodium chloride 0.9 % 100 mL IVPB        2 g 200 mL/hr over 30 Minutes Intravenous Every 12 hours 10/18/20 1434 10/20/20 0959   10/18/20 0630  cefoTEtan (CEFOTAN) 2 g in sodium chloride 0.9 % 100 mL IVPB        2 g 200 mL/hr over 30 Minutes Intravenous On call to O.R. 10/18/20 7253 10/18/20 0820       Subjective: Patient seen and evaluated today with no new acute complaints or concerns. No acute concerns or events noted overnight.  Objective: Vitals:   10/19/20 0600 10/19/20 0700 10/19/20 0740 10/19/20 0805  BP: (!) 119/45 (!) 112/44    Pulse: 67 66  Resp: 15 14    Temp: 98 F (36.7 C)  98.1 F (36.7 C)   TempSrc: Oral  Oral   SpO2: 92% 91%  92%  Weight:      Height:        Intake/Output Summary (Last 24 hours) at 10/19/2020 1149 Last data filed at 10/19/2020 0700 Gross per 24 hour  Intake 5603.47 ml  Output 200 ml  Net 5403.47 ml   Filed Weights   10/18/20 1623 10/19/20 0500  Weight: 109 kg 109.4 kg    Examination:  General exam: Appears calm and comfortable  Respiratory system: Clear to auscultation.  Respiratory effort normal. Cardiovascular system: S1 & S2 heard, RRR.  Gastrointestinal system: Abdomen with binder present; NGT to suction Central nervous system: Alert and awake Extremities: SCDs Skin: No rashes, lesions or ulcers Psychiatry: Judgement and insight appear normal. Mood & affect appropriate.     Data Reviewed: I have personally reviewed following labs and imaging studies  CBC: Recent Labs  Lab 10/16/20 0950 10/19/20 0323  WBC 6.3 8.8  NEUTROABS 3.7 6.9  HGB 11.8* 11.6*  HCT 38.3* 37.4*  MCV 95.5 95.4  PLT 247 545   Basic Metabolic Panel: Recent Labs  Lab 10/16/20 0950 10/19/20 0323  NA 137 135  K 4.0 4.9  CL 104 104  CO2 26 23  GLUCOSE 81 129*  BUN 20 17  CREATININE 1.12 0.99  CALCIUM 9.0 8.4*  MG  --  1.7  PHOS  --  5.0*   GFR: Estimated Creatinine Clearance: 75 mL/min (by C-G formula based on SCr of 0.99 mg/dL). Liver Function Tests: Recent Labs  Lab 10/16/20 0950  AST 24  ALT 28  ALKPHOS 40  BILITOT 0.7  PROT 6.3*  ALBUMIN 3.4*   No results for input(s): LIPASE, AMYLASE in the last 168 hours. No results for input(s): AMMONIA in the last 168 hours. Coagulation Profile: No results for input(s): INR, PROTIME in the last 168 hours. Cardiac Enzymes: No results for input(s): CKTOTAL, CKMB, CKMBINDEX, TROPONINI in the last 168 hours. BNP (last 3 results) No results for input(s): PROBNP in the last 8760 hours. HbA1C: No results for input(s): HGBA1C in the last 72 hours. CBG: Recent Labs  Lab 10/18/20 1555 10/18/20 1648 10/18/20 1942 10/18/20 2254 10/19/20 0611  GLUCAP 162* 151* 157* 119* 107*   Lipid Profile: No results for input(s): CHOL, HDL, LDLCALC, TRIG, CHOLHDL, LDLDIRECT in the last 72 hours. Thyroid Function Tests: No results for input(s): TSH, T4TOTAL, FREET4, T3FREE, THYROIDAB in the last 72 hours. Anemia Panel: No results for input(s): VITAMINB12, FOLATE, FERRITIN, TIBC, IRON, RETICCTPCT in the last 72 hours. Sepsis  Labs: No results for input(s): PROCALCITON, LATICACIDVEN in the last 168 hours.  Recent Results (from the past 240 hour(s))  SARS CORONAVIRUS 2 (TAT 6-24 HRS) Nasopharyngeal Nasopharyngeal Swab     Status: None   Collection Time: 10/16/20  9:00 AM   Specimen: Nasopharyngeal Swab  Result Value Ref Range Status   SARS Coronavirus 2 NEGATIVE NEGATIVE Final    Comment: (NOTE) SARS-CoV-2 target nucleic acids are NOT DETECTED.  The SARS-CoV-2 RNA is generally detectable in upper and lower respiratory specimens during the acute phase of infection. Negative results do not preclude SARS-CoV-2 infection, do not rule out co-infections with other pathogens, and should not be used as the sole basis for treatment or other patient management decisions. Negative results must be combined with clinical observations, patient history, and epidemiological information. The expected result is Negative.  Fact  Sheet for Patients: SugarRoll.be  Fact Sheet for Healthcare Providers: https://www.woods-mathews.com/  This test is not yet approved or cleared by the [place] FDA and  has been authorized for detection and/or diagnosis of SARS-CoV-2 by FDA under an Emergency Use Authorization (EUA). This EUA will remain  in effect (meaning this test can be used) for the duration of the COVID-19 declaration under Se ction 564(b)(1) of the Act, 21 U.S.C. section 360bbb-3(b)(1), unless the authorization is terminated or revoked sooner.  Performed at [facility], [place] [address] [place], [place] [address]   Aerobic/Anaerobic Culture (surgical/deep wound)     Status: None (Preliminary result)   Collection Time: 10/18/20  8:52 AM   Specimen: PATH Other  Result Value Ref Range Status   Specimen Description   Final    WOUND Performed at [facility], [address], [place], [place] [address]    Special Requests   Final    NONE Performed at [facility],  [address] [place], [place] [address]    Gram Stain   Final    RARE WBC PRESENT, PREDOMINANTLY PMN FEW GRAM NEGATIVE RODS Performed at [facility], [place] [address] [place], [place] [address]    Culture FEW GRAM NEGATIVE RODS  Final   Report Status PENDING  Incomplete         Radiology Studies: DG Chest Port 1 View  Result Date: 10/18/2020 CLINICAL DATA:  79 year old male status post NG placement. EXAM: PORTABLE CHEST 1 VIEW COMPARISON:  Chest radiograph dated 08/30/2020. FINDINGS: Enteric tube with tip and side-port in the proximal stomach. There is mild cardiomegaly with mild vascular congestion. Left lung base density, likely atelectasis. A trace left pleural effusion is not excluded. No pneumothorax. Atherosclerotic calcification of the aorta. No acute osseous pathology. IMPRESSION: 1. Enteric tube with tip and side-port in the proximal stomach. 2. Cardiomegaly with mild vascular congestion. Electronically Signed   By: Anner Crete M.D.   On: 10/18/2020 15:36   [place] EKG SITE RITE  Result Date: 10/18/2020 If Site Rite image not attached, placement could not be confirmed due to current cardiac rhythm.       Scheduled Meds:  sodium chloride   Intravenous Once   acetaminophen  1,000 mg Oral Q6H   Chlorhexidine Gluconate Cloth  6 each Topical Daily   diltiazem  360 mg Oral Daily   fluticasone furoate-vilanterol  1 puff Inhalation Daily   heparin injection (subcutaneous)  5,000 Units Subcutaneous Q8H   insulin aspart  0-20 Units Subcutaneous Q6H   metoprolol tartrate  25 mg Oral BID   tamsulosin  0.4 mg Oral QHS   umeclidinium bromide  1 puff Inhalation Daily   Continuous Infusions:  sodium chloride     cefoTEtan (CEFOTAN) IV 2 g (10/19/20 1116)   lactated ringers 75 mL/hr at 10/19/20 0700     LOS: 1 day    Time spent: 30 minutes    Carollee Nussbaumer Darleen Crocker, DO [facility]  If 7PM-7AM, please contact night-coverage [email or web address] 10/19/2020, 11:49  AM

## 2020-10-19 NOTE — Progress Notes (Signed)
[facility] Progress Note  1 Day Post-Op  Subjective: No major complaints. Says he is hungry. No flatus or BM. NG without dark output about 500 in canister.   Objective: Vital signs in last 24 hours: Temp:  [97.6 F (36.4 C)-98.1 F (36.7 C)] 97.9 F (36.6 C) (12/02 1213) Pulse Rate:  [61-86] 73 (12/02 1600) Resp:  [12-20] 17 (12/02 1600) BP: (104-138)/(40-95) 133/48 (12/02 1600) SpO2:  [83 %-98 %] 87 % (12/02 1600) Weight:  [109.4 kg] 109.4 kg (12/02 0500)    Intake/Output from previous day: 12/01 0701 - 12/02 0700 In: 5603.5 [I.V.:5278.5; IV Piggyback:200] Out: 425 [Urine:325; Blood:100] Intake/Output this shift: Total I/O In: 618 [I.V.:618] Out: 250 [Urine:250]  General appearance: alert, cooperative and no distress Resp: normal work of breathing GI: soft, nondistended, appropriately tender, honeycomb in midline, dry blood inferior, ostomy site bandage with some dry fluid on gauze  Lab Results:  Recent Labs    10/19/20 0323  WBC 8.8  HGB 11.6*  HCT 37.4*  PLT 202   BMET Recent Labs    10/19/20 0323  NA 135  K 4.9  CL 104  CO2 23  GLUCOSE 129*  BUN 17  CREATININE 0.99  CALCIUM 8.4*    Studies/Results: DG Chest Portable 1 View  Result Date: 10/19/2020 CLINICAL DATA:  Central catheter placement EXAM: PORTABLE CHEST 1 VIEW COMPARISON:  October 18, 2020 FINDINGS: Central catheter tip is in the superior vena cava. Nasogastric tube tip and side port are below the diaphragm. No pneumothorax. There is a small pleural effusion on each side with mild bibasilar atelectasis. No consolidation evident. Heart is mildly enlarged with pulmonary vascularity normal. No adenopathy. There is aortic atherosclerosis. No bone lesions. IMPRESSION: Tube and catheter positions as described without pneumothorax. Small pleural effusions bilaterally with mild bibasilar atelectasis. No frank edema or consolidation. Stable cardiac prominence. Aortic Atherosclerosis  (ICD10-I70.0). Electronically Signed   By: Lowella Grip III M.D.   On: 10/19/2020 13:49   DG Chest Port 1 View  Result Date: 10/18/2020 CLINICAL DATA:  79 year old male status post NG placement. EXAM: PORTABLE CHEST 1 VIEW COMPARISON:  Chest radiograph dated 08/30/2020. FINDINGS: Enteric tube with tip and side-port in the proximal stomach. There is mild cardiomegaly with mild vascular congestion. Left lung base density, likely atelectasis. A trace left pleural effusion is not excluded. No pneumothorax. Atherosclerotic calcification of the aorta. No acute osseous pathology. IMPRESSION: 1. Enteric tube with tip and side-port in the proximal stomach. 2. Cardiomegaly with mild vascular congestion. Electronically Signed   By: Anner Crete M.D.   On: 10/18/2020 15:36   [place] EKG SITE RITE  Result Date: 10/18/2020 If Site Rite image not attached, placement could not be confirmed due to current cardiac rhythm.   Anti-infectives: Anti-infectives (From admission, onward)   Start     Dose/Rate Route Frequency Ordered Stop   10/18/20 2200  cefoTEtan (CEFOTAN) 2 g in sodium chloride 0.9 % 100 mL IVPB        2 g 200 mL/hr over 30 Minutes Intravenous Every 12 hours 10/18/20 1434 10/20/20 0959   10/18/20 0630  cefoTEtan (CEFOTAN) 2 g in sodium chloride 0.9 % 100 mL IVPB        2 g 200 mL/hr over 30 Minutes Intravenous On call to O.R. 10/18/20 2202 10/18/20 0820      Assessment/Plan: Mr. Nied is a 79 yo s/p colostomy reversal and SBR. Doing well overall.  Scheduled tylenol PRN narcotics IS, OOB Stepdown telemetry tonight  NPO, NG, ok to have meds and hold suction, awaiting bowel function  Foley out, not sure if all of output recorded, BUN/Cr stable, labs tomorrow SCDs, heparin sq  PICC in place for access    LOS: 1 day    Virl Cagey 10/19/2020

## 2020-10-20 ENCOUNTER — Inpatient Hospital Stay (HOSPITAL_COMMUNITY): Payer: Medicare PPO

## 2020-10-20 DIAGNOSIS — I361 Nonrheumatic tricuspid (valve) insufficiency: Secondary | ICD-10-CM

## 2020-10-20 DIAGNOSIS — I4891 Unspecified atrial fibrillation: Secondary | ICD-10-CM | POA: Diagnosis not present

## 2020-10-20 DIAGNOSIS — Z933 Colostomy status: Secondary | ICD-10-CM | POA: Diagnosis not present

## 2020-10-20 HISTORY — PX: TRANSTHORACIC ECHOCARDIOGRAM: SHX275

## 2020-10-20 LAB — BASIC METABOLIC PANEL
Anion gap: 9 (ref 5–15)
BUN: 14 mg/dL (ref 8–23)
CO2: 24 mmol/L (ref 22–32)
Calcium: 8.5 mg/dL — ABNORMAL LOW (ref 8.9–10.3)
Chloride: 104 mmol/L (ref 98–111)
Creatinine, Ser: 1.02 mg/dL (ref 0.61–1.24)
GFR, Estimated: >60 mL/min (ref >60)
Glucose, Bld: 87 mg/dL (ref 70–99)
Potassium: 3.5 mmol/L (ref 3.5–5.1)
Sodium: 137 mmol/L (ref 135–145)

## 2020-10-20 LAB — ECHOCARDIOGRAM COMPLETE
Area-P 1/2: 3.08 cm2
Height: 69 in
S' Lateral: 2.85 cm
Weight: 3901.26 oz

## 2020-10-20 LAB — CBC
HCT: 33.6 % — ABNORMAL LOW (ref 39.0–52.0)
Hemoglobin: 10.7 g/dL — ABNORMAL LOW (ref 13.0–17.0)
MCH: 29.8 pg (ref 26.0–34.0)
MCHC: 31.8 g/dL (ref 30.0–36.0)
MCV: 93.6 fL (ref 80.0–100.0)
Platelets: 211 10*3/uL (ref 150–400)
RBC: 3.59 MIL/uL — ABNORMAL LOW (ref 4.22–5.81)
RDW: 17.3 % — ABNORMAL HIGH (ref 11.5–15.5)
WBC: 9.5 10*3/uL (ref 4.0–10.5)
nRBC: 0 % (ref 0.0–0.2)

## 2020-10-20 LAB — MAGNESIUM: Magnesium: 1.7 mg/dL (ref 1.7–2.4)

## 2020-10-20 LAB — GLUCOSE, CAPILLARY
Glucose-Capillary: 75 mg/dL (ref 70–99)
Glucose-Capillary: 82 mg/dL (ref 70–99)
Glucose-Capillary: 85 mg/dL (ref 70–99)
Glucose-Capillary: 88 mg/dL (ref 70–99)

## 2020-10-20 LAB — SURGICAL PATHOLOGY

## 2020-10-20 LAB — TSH: TSH: 4.978 u[IU]/mL — ABNORMAL HIGH (ref 0.350–4.500)

## 2020-10-20 MED ORDER — POTASSIUM CHLORIDE 10 MEQ/100ML IV SOLN
10.0000 meq | INTRAVENOUS | Status: AC
  Administered 2020-10-20 (×3): 10 meq via INTRAVENOUS
  Filled 2020-10-20 (×3): qty 100

## 2020-10-20 MED ORDER — DEXTROSE-NACL 5-0.45 % IV SOLN: INTRAVENOUS | Status: AC

## 2020-10-20 MED ORDER — PIPERACILLIN-TAZOBACTAM 3.375 G IVPB 30 MIN
3.3750 g | Freq: Once | INTRAVENOUS | Status: DC
  Filled 2020-10-20: qty 50

## 2020-10-20 MED ORDER — PIPERACILLIN-TAZOBACTAM 3.375 G IVPB
3.3750 g | Freq: Three times a day (TID) | INTRAVENOUS | Status: DC
  Administered 2020-10-20 – 2020-10-23 (×9): 3.375 g via INTRAVENOUS
  Filled 2020-10-20 (×9): qty 50

## 2020-10-20 MED ORDER — POTASSIUM CHLORIDE 10 MEQ/50ML IV SOLN
10.0000 meq | INTRAVENOUS | Status: DC
  Filled 2020-10-20 (×3): qty 50

## 2020-10-20 NOTE — Progress Notes (Signed)
PROGRESS NOTE    Vincent Cooper  HTD:[id number] DOB: 1940/12/09 DOA: 10/18/2020 PCP: Manon Hilding, MD   Brief Narrative:  Vincent Cooper an 79 y.o.malewith prior history of sigmoid colon resection and colostomy for complicated diverticulitis who has undergone colostomy reversal with side-to-side anastomosis and small bowel resection with primary anastomosis. He has medical problems to include chronic gout,prior PE on Eliquis,COPD, CAD, type 2 diabetes-well controlled, hypertension, OSA on CPAP, BPH,andobesity. He appears to have tolerated the procedure well and is currently n.p.o except for medications.He denies any significant pain currently and is otherwise resting comfortably. We have been asked to consult for medical management during his hospitalization. He is currently n.p.o. with NG tube to low intermittent suction.  -He appears to have some atrial fibrillation on telemetry which has not been noted on his prior history.  Further work-up to follow as noted below.  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[place])   Colostomy reversal with side-to-side anastomosis -Dietary advancement and surgical management per primary team -Prefer to DC IVF once diet advanced  New onset afib-rate controlled; asymptomatic -Confirmed on EKG with heart rate 97 bpm -Plan to check TSH -Plan to check 2D echocardiogram with prior noted on 10/2019 with LVEF 60-65% -Continue on metoprolol and diltiazem for heart rate control with adequate rate control noted and no symptomatology -CHA2DS2-VASc of 4 noted and patient already on Eliquis at home for prior PE which will need to be continued once okay with primary team -Plan to follow-up outpatient with cardiology  History of type 2 diabetes -Patient noted to be on SSI which is appropriate while n.p.o. -Recent hemoglobin A1c 4.7% on 10/16/2020 -Holding home glimepiride for now which  is appropriate -Hold long-acting insulin until diet further advanced -BG well controlled  History of hypertension/CAD -Blood pressures currently controlled -Continue metoprolol and diltiazem as ordered with good heart rate control noted -Appropriate to hold Crestor for now as well as torsemide until diet further advanced  History ofCOPD -Patient does not wear home oxygen -Use breathing treatments as needed for shortness of breath or wheezing as ordered -No acute bronchospasms currently noted  History of BPH -Continue tamsulosin  History of PE -Eliquis currently held for procedure -May resume when ok with primary team  Morbid obesity/OSA -Lifestyle changes outpatient -Uses CPAP at night which has been reordered   DVT prophylaxis:Heparin Code Status: Full Family Communication:  No family currently at bedside Disposition Plan:  Status is: Inpatient  Remains inpatient appropriate because:IV treatments appropriate due to intensity of illness or inability to take PO and Inpatient level of care appropriate due to severity of illness   Dispo: The patient is from: Home  Anticipated d/c is to: Home  Anticipated d/c date is: 2-3 days  Patient currently is not medically stable to d/c.  Procedures:   POD #2  Antimicrobials:  Anti-infectives (From admission, onward)   Start     Dose/Rate Route Frequency Ordered Stop   10/18/20 2200  cefoTEtan (CEFOTAN) 2 g in sodium chloride 0.9 % 100 mL IVPB        2 g 200 mL/hr over 30 Minutes Intravenous Every 12 hours 10/18/20 1434 10/19/20 2223   10/18/20 0630  cefoTEtan (CEFOTAN) 2 g in sodium chloride 0.9 % 100 mL IVPB        2 g 200 mL/hr over 30 Minutes Intravenous On call to O.R. 10/18/20 7262 10/18/20 0820  Subjective: Patient seen and evaluated today with no new acute complaints or concerns. No acute concerns or events noted overnight.  He denies any shortness of breath,  chest pain, or palpitations.  Objective: Vitals:   10/20/20 0800 10/20/20 0829 10/20/20 0900 10/20/20 1000  BP: (!) 104/52  (!) 116/48 110/60  Pulse: 83  91 92  Resp: 15  16 (!) 24  Temp:      TempSrc:      SpO2: 94% 95% 96% 96%  Weight:      Height:        Intake/Output Summary (Last 24 hours) at 10/20/2020 1021 Last data filed at 10/20/2020 0920 Gross per 24 hour  Intake 1604.96 ml  Output 550 ml  Net 1054.96 ml   Filed Weights   10/18/20 1623 10/19/20 0500 10/20/20 0400  Weight: 109 kg 109.4 kg 110.6 kg    Examination:  General exam: Appears calm and comfortable  Respiratory system: Clear to auscultation. Respiratory effort normal.  Currently on room air. Cardiovascular system: S1 & S2 heard, irregular.  Gastrointestinal system: Abdominal binder present.  NG tube to low intermittent suction. Central nervous system: Alert and oriented. No focal neurological deficits. Extremities: SCDs bilaterally Skin: No rashes, lesions or ulcers Psychiatry: Judgement and insight appear normal. Mood & affect appropriate.     Data Reviewed: I have personally reviewed following labs and imaging studies  CBC: Recent Labs  Lab 10/16/20 0950 10/19/20 0323 10/20/20 0514  WBC 6.3 8.8 9.5  NEUTROABS 3.7 6.9  --   HGB 11.8* 11.6* 10.7*  HCT 38.3* 37.4* 33.6*  MCV 95.5 95.4 93.6  PLT 247 202 443   Basic Metabolic Panel: Recent Labs  Lab 10/16/20 0950 10/19/20 0323 10/20/20 0514  NA 137 135 137  K 4.0 4.9 3.5  CL 104 104 104  CO2 26 23 24   GLUCOSE 81 129* 87  BUN 20 17 14   CREATININE 1.12 0.99 1.02  CALCIUM 9.0 8.4* 8.5*  MG  --  1.7 1.7  PHOS  --  5.0*  --    GFR: Estimated Creatinine Clearance: 73.2 mL/min (by C-G formula based on SCr of 1.02 mg/dL). Liver Function Tests: Recent Labs  Lab 10/16/20 0950  AST 24  ALT 28  ALKPHOS 40  BILITOT 0.7  PROT 6.3*  ALBUMIN 3.4*   No results for input(s): LIPASE, AMYLASE in the last 168 hours. No results for input(s):  AMMONIA in the last 168 hours. Coagulation Profile: No results for input(s): INR, PROTIME in the last 168 hours. Cardiac Enzymes: No results for input(s): CKTOTAL, CKMB, CKMBINDEX, TROPONINI in the last 168 hours. BNP (last 3 results) No results for input(s): PROBNP in the last 8760 hours. HbA1C: No results for input(s): HGBA1C in the last 72 hours. CBG: Recent Labs  Lab 10/19/20 1807 10/19/20 1959 10/19/20 2300 10/19/20 2357 10/20/20 0638  GLUCAP 88 80 67* 106* 85   Lipid Profile: No results for input(s): CHOL, HDL, LDLCALC, TRIG, CHOLHDL, LDLDIRECT in the last 72 hours. Thyroid Function Tests: No results for input(s): TSH, T4TOTAL, FREET4, T3FREE, THYROIDAB in the last 72 hours. Anemia Panel: No results for input(s): VITAMINB12, FOLATE, FERRITIN, TIBC, IRON, RETICCTPCT in the last 72 hours. Sepsis Labs: No results for input(s): PROCALCITON, LATICACIDVEN in the last 168 hours.  Recent Results (from the past 240 hour(s))  SARS CORONAVIRUS 2 (TAT 6-24 HRS) Nasopharyngeal Nasopharyngeal Swab     Status: None   Collection Time: 10/16/20  9:00 AM   Specimen: Nasopharyngeal Swab  Result Value Ref Range Status   SARS Coronavirus 2 NEGATIVE NEGATIVE Final    Comment: (NOTE) SARS-CoV-2 target nucleic acids are NOT DETECTED.  The SARS-CoV-2 RNA is generally detectable in upper and lower respiratory specimens during the acute phase of infection. Negative results do not preclude SARS-CoV-2 infection, do not rule out co-infections with other pathogens, and should not be used as the sole basis for treatment or other patient management decisions. Negative results must be combined with clinical observations, patient history, and epidemiological information. The expected result is Negative.  Fact Sheet for Patients: SugarRoll.be  Fact Sheet for Healthcare Providers: https://www.woods-mathews.com/  This test is not yet approved or cleared by  the [place] FDA and  has been authorized for detection and/or diagnosis of SARS-CoV-2 by FDA under an Emergency Use Authorization (EUA). This EUA will remain  in effect (meaning this test can be used) for the duration of the COVID-19 declaration under Se ction 564(b)(1) of the Act, 21 U.S.C. section 360bbb-3(b)(1), unless the authorization is terminated or revoked sooner.  Performed at [facility], [place] [address] [place], [place] [address]   Aerobic/Anaerobic Culture (surgical/deep wound)     Status: None (Preliminary result)   Collection Time: 10/18/20  8:52 AM   Specimen: PATH Other  Result Value Ref Range Status   Specimen Description   Final    WOUND Performed at [facility], [address], [place], [place] [address]    Special Requests   Final    NONE Performed at [facility], [address], [place], [place] [address]    Gram Stain   Final    RARE WBC PRESENT, PREDOMINANTLY PMN FEW GRAM NEGATIVE RODS Performed at [facility], [place] [address], [place],  [address]    Culture FEW PSEUDOMONAS AERUGINOSA  Final   Report Status PENDING  Incomplete         Radiology Studies: DG Chest Portable 1 View  Result Date: 10/19/2020 CLINICAL DATA:  Central catheter placement EXAM: PORTABLE CHEST 1 VIEW COMPARISON:  October 18, 2020 FINDINGS: Central catheter tip is in the superior vena cava. Nasogastric tube tip and side port are below the diaphragm. No pneumothorax. There is a small pleural effusion on each side with mild bibasilar atelectasis. No consolidation evident. Heart is mildly enlarged with pulmonary vascularity normal. No adenopathy. There is aortic atherosclerosis. No bone lesions. IMPRESSION: Tube and catheter positions as described without pneumothorax. Small pleural effusions bilaterally with mild bibasilar atelectasis. No frank edema or consolidation. Stable cardiac prominence. Aortic Atherosclerosis (ICD10-I70.0). Electronically Signed   By:  Lowella Grip III M.D.   On: 10/19/2020 13:49   DG Chest Port 1 View  Result Date: 10/18/2020 CLINICAL DATA:  79 year old male status post NG placement. EXAM: PORTABLE CHEST 1 VIEW COMPARISON:  Chest radiograph dated 08/30/2020. FINDINGS: Enteric tube with tip and side-port in the proximal stomach. There is mild cardiomegaly with mild vascular congestion. Left lung base density, likely atelectasis. A trace left pleural effusion is not excluded. No pneumothorax. Atherosclerotic calcification of the aorta. No acute osseous pathology. IMPRESSION: 1. Enteric tube with tip and side-port in the proximal stomach. 2. Cardiomegaly with mild vascular congestion. Electronically Signed   By: Anner Crete M.D.   On: 10/18/2020 15:36   [place] EKG SITE RITE  Result Date: 10/18/2020 If Site Rite image not attached, placement could not be confirmed due to current cardiac rhythm.       Scheduled Meds: . sodium chloride  Intravenous Once  . acetaminophen  1,000 mg Oral Q6H  . Chlorhexidine Gluconate Cloth  6 each Topical Daily  . diltiazem  360 mg Oral Daily  . fluticasone furoate-vilanterol  1 puff Inhalation Daily  . heparin injection (subcutaneous)  5,000 Units Subcutaneous Q8H  . insulin aspart  0-20 Units Subcutaneous Q6H  . metoprolol tartrate  25 mg Oral BID  . sodium chloride flush  10-40 mL Intracatheter Q12H  . tamsulosin  0.4 mg Oral QHS  . umeclidinium bromide  1 puff Inhalation Daily   Continuous Infusions: . sodium chloride    . dextrose 5 % and 0.45% NaCl 50 mL/hr at 10/20/20 0800  . lactated ringers 75 mL/hr at 10/19/20 2223     LOS: 2 days    Time spent: 35 minutes    Nemesio Castrillon Darleen Crocker, DO [facility]  If 7PM-7AM, please contact night-coverage [email or web address] 10/20/2020, 10:21 AM

## 2020-10-20 NOTE — TOC Initial Note (Signed)
Transition of Care [facility]) - Initial/Assessment Note   Patient Details  Name: Vincent Cooper MRN: [id number] Date of Birth: September 09, 1941  Transition of Care [facility]) CM/SW Contact:    Sherie Don, LCSW Phone Number: 10/20/2020, 2:53 PM  Clinical Narrative: Patient is a 79 year old male who was admitted for colostomy reversal. PT evaluation recommended SNF. CSW spoke with patient and patient agreeable to SNF referrals. FL2 completed and PASRR confirmed. Initial referral and PT notes faxed out to SNFs. TOC to follow.  Expected Discharge Plan: Skilled Nursing Facility Barriers to Discharge: Continued Medical Work up  Patient Goals and CMS Choice Patient states their goals for this hospitalization and ongoing recovery are:: Go to rehab CMS Medicare.gov Compare Post Acute Care list provided to:: Patient Choice offered to / list presented to : Patient  Expected Discharge Plan and Services Expected Discharge Plan: [place] In-house Referral: Clinical Social Work Discharge Planning Services: NA Post Acute Care Choice: [place] Living arrangements for the past 2 months: Single Family Home             DME Arranged: N/A DME Agency: NA HH Arranged: NA [place] Agency: NA  Prior Living Arrangements/Services Living arrangements for the past 2 months: [place] Lives with:: Self Patient language and need for interpreter reviewed:: Yes Do you feel safe going back to the place where you live?: Yes      Need for Family Participation in Patient Care: No (Comment) Care giver support system in place?: Yes (comment) Criminal Activity/Legal Involvement Pertinent to Current Situation/Hospitalization: No - Comment as needed  Activities of Daily Living Home Assistive Devices/Equipment: Dentures (specify type), Walker (specify type) ADL Screening (condition at time of admission) Patient's cognitive ability adequate to safely complete daily activities?: Yes Is the patient  deaf or have difficulty hearing?: Yes Does the patient have difficulty seeing, even when wearing glasses/contacts?: No Does the patient have difficulty concentrating, remembering, or making decisions?: No Patient able to express need for assistance with ADLs?: Yes Does the patient have difficulty dressing or bathing?: No Independently performs ADLs?: No Communication: Independent Dressing (OT): Independent Grooming: Independent Feeding: Independent Bathing: Independent Toileting: Independent In/Out Bed: Independent Walks in Home: Independent with device (comment) Does the patient have difficulty walking or climbing stairs?: Yes Weakness of Legs: Both Weakness of Arms/Hands: None  Permission Sought/Granted Permission sought to share information with : Facility Art therapist granted to share info w AGENCY: SNFs  Emotional Assessment Appearance:: Appears stated age Attitude/Demeanor/Rapport: Engaged Affect (typically observed): Accepting Orientation: : Oriented to Self, Oriented to Place, Oriented to  Time, Oriented to Situation Alcohol / Substance Use: Not Applicable Psych Involvement: No (comment)  Admission diagnosis:  Colostomy in place [facility]) [Z93.3] Patient Active Problem List   Diagnosis Date Noted  . Preop cardiovascular exam 09/23/2020  . 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 Pulmonary embolism during treatment with long-term anticoagulation therapy ([place]) 08/02/2020  . Retraction of colostomy ([place]) 07/11/2020  . Colostomy in place [facility]) 07/11/2020  . Bowel perforation ([place]) 06/21/2020  . Free intraperitoneal air   . Cellulitis, leg 06/17/2020  . AKI (acute kidney injury) ([place]) 06/17/2020  . Coronary artery disease, non-occlusive 03/20/2020  . Hyperlipidemia with target LDL less than 70 03/20/2020  . Acute respiratory failure with hypoxia ([place])   . Bronchiectasis with acute exacerbation ([place])   . Chronic diastolic HF (heart failure) ([place])   .  Chest pain with moderate risk for cardiac etiology 01/19/2020  . Shortness of  breath 01/19/2020  . Morbid obesity ([place]) 01/19/2020  . COPD with acute exacerbation ([place]) 01/19/2020  . Cardiac murmur 11/07/2019  . COPD (chronic obstructive pulmonary disease) ([place]) 11/07/2019  . Diabetes mellitus type II, non insulin dependent ([place]) 11/07/2019  . OSA and COPD overlap syndrome ([place]) 11/07/2019  . Essential hypertension 11/07/2019  . Single subsegmental pulmonary embolism without acute cor pulmonale ([place]) 11/06/2019   PCP:  Manon Hilding, MD Pharmacy:   [place], [place] [address] Phone: [phone] Fax: [place], [place] [place] [address] Phone: [phone] Fax: [place], [place] [address] [place] [address] Phone: [phone] Fax: [place], [place] [address] [place] [address] Phone: [phone] Fax: [phone]  Readmission Risk Interventions Readmission Risk Prevention Plan 06/23/2020  Transportation Screening Complete  HRI or Home Care Consult Complete  Palliative Care Screening Not Applicable  Medication Review (RN Care Manager) Complete

## 2020-10-20 NOTE — [place] FL2 (Signed)
[place] MEDICAID FL2 LEVEL OF CARE SCREENING TOOL     IDENTIFICATION  Patient Name: Vincent Cooper Birthdate: 1941/09/19 Sex: male Admission Date (Current Location): 10/18/2020  [place] and [place] Number:  [place] [id number] Trail and Address:  [place] [address] [place]      Provider Number: [id number]  Attending Physician Name and Address:  Virl Cagey, MD  Relative Name and Phone Number:  Thamas Jaegers (daughter) Ph: [phone]    Current Level of Care: Hospital Recommended Level of Care: [place] Prior Approval Number:    Date Approved/Denied:   PASRR Number: [id number]  Discharge Plan: SNF    Current Diagnoses: Patient Active Problem List   Diagnosis Date Noted  . Preop cardiovascular exam 09/23/2020  . 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 Pulmonary embolism during treatment with long-term anticoagulation therapy ([place]) 08/02/2020  . Retraction of colostomy ([place]) 07/11/2020  . Colostomy in place [facility]) 07/11/2020  . Bowel perforation ([place]) 06/21/2020  . Free intraperitoneal air   . Cellulitis, leg 06/17/2020  . AKI (acute kidney injury) ([place]) 06/17/2020  . Coronary artery disease, non-occlusive 03/20/2020  . Hyperlipidemia with target LDL less than 70 03/20/2020  . Acute respiratory failure with hypoxia ([place])   . Bronchiectasis with acute exacerbation ([place])   . Chronic diastolic HF (heart failure) ([place])   . Chest pain with moderate risk for cardiac etiology 01/19/2020  . Shortness of breath 01/19/2020  . Morbid obesity ([place]) 01/19/2020  . COPD with acute exacerbation ([place]) 01/19/2020  . Cardiac murmur 11/07/2019  . COPD (chronic obstructive pulmonary disease) ([place]) 11/07/2019  . Diabetes mellitus type II, non insulin dependent ([place]) 11/07/2019  . OSA and COPD overlap syndrome ([place]) 11/07/2019  . Essential hypertension 11/07/2019  . Single subsegmental pulmonary embolism without acute cor  pulmonale ([place]) 11/06/2019    Orientation RESPIRATION BLADDER Height & Weight     Self, Time, Situation, Place  O2 (2L/min PRN) Continent Weight: 243 lb 13.3 oz (110.6 kg) Height:  5\' 9"  (175.3 cm)  BEHAVIORAL SYMPTOMS/MOOD NEUROLOGICAL BOWEL NUTRITION STATUS      Continent Diet (See discharge summary)  AMBULATORY STATUS COMMUNICATION OF NEEDS Skin   Extensive Assist Verbally Surgical wounds (Surgical wound from colostomy reversal)                       Personal Care Assistance Level of Assistance  Bathing, Feeding, Dressing Bathing Assistance: Maximum assistance Feeding assistance: Independent Dressing Assistance: Maximum assistance     Functional Limitations Info  Sight, Hearing, Speech Sight Info: Impaired Hearing Info: Impaired Speech Info: Adequate    SPECIAL CARE FACTORS FREQUENCY  PT (By licensed PT)     PT Frequency: 5x's/week              Contractures Contractures Info: Not present    Additional Factors Info  Code Status, Allergies, Insulin Sliding Scale Code Status Info: Full Allergies Info: NKA   Insulin Sliding Scale Info: See discharge summary       Current Medications (10/20/2020):  This is the current hospital active medication list Current Facility-Administered Medications  Medication Dose Route Frequency Provider Last Rate Last Admin  . 0.9 %  sodium chloride infusion (Manually program via Guardrails IV Fluids)   Intravenous Once Virl Cagey, MD   Stopped at 10/18/20 1636  . acetaminophen (TYLENOL) tablet 1,000 mg  1,000 mg Oral Q6H Virl Cagey, MD   1,000 mg at 10/20/20 1157  .  albuterol (VENTOLIN HFA) 108 (90 Base) MCG/ACT inhaler 1 puff  1 puff Inhalation Q6H PRN Virl Cagey, MD      . Chlorhexidine Gluconate Cloth 2 % PADS 6 each  6 each Topical Daily Manuella Ghazi, Pratik D, DO   6 each at 10/19/20 1055  . dextrose 5 %-0.45 % sodium chloride infusion   Intravenous Continuous Virl Cagey, MD   Stopped at 10/20/20 1350   . diltiazem (TIAZAC) 24 hr capsule 360 mg  360 mg Oral Daily Virl Cagey, MD   360 mg at 10/20/20 1448  . diphenhydrAMINE (BENADRYL) 12.5 MG/5ML elixir 12.5 mg  12.5 mg Oral Q6H PRN Virl Cagey, MD       Or  . diphenhydrAMINE (BENADRYL) injection 12.5 mg  12.5 mg Intravenous Q6H PRN Virl Cagey, MD   12.5 mg at 10/20/20 1032  . fluticasone furoate-vilanterol (BREO ELLIPTA) 200-25 MCG/INH 1 puff  1 puff Inhalation Daily Virl Cagey, MD   1 puff at 10/20/20 0827  . heparin injection 5,000 Units  5,000 Units Subcutaneous Q8H Virl Cagey, MD   5,000 Units at 10/20/20 1343  . insulin aspart (novoLOG) injection 0-20 Units  0-20 Units Subcutaneous Q6H Virl Cagey, MD      . ipratropium-albuterol (DUONEB) 0.5-2.5 (3) MG/3ML nebulizer solution 3 mL  3 mL Nebulization Q6H PRN Virl Cagey, MD      . ketorolac (TORADOL) 15 MG/ML injection 15 mg  15 mg Intravenous Q6H PRN Virl Cagey, MD      . metoCLOPramide (REGLAN) injection 10 mg  10 mg Intravenous Q6H PRN Virl Cagey, MD      . metoprolol tartrate (LOPRESSOR) injection 5 mg  5 mg Intravenous Q6H PRN Virl Cagey, MD      . metoprolol tartrate (LOPRESSOR) tablet 25 mg  25 mg Oral BID Manuella Ghazi, Pratik D, DO   25 mg at 10/20/20 1856  . morphine 2 MG/ML injection 2 mg  2 mg Intravenous Q3H PRN Virl Cagey, MD   2 mg at 10/18/20 1601  . nitroGLYCERIN (NITROSTAT) SL tablet 0.4 mg  0.4 mg Sublingual Q5 min PRN Virl Cagey, MD      . ondansetron [facility]) tablet 4 mg  4 mg Oral Q6H PRN Virl Cagey, MD       Or  . ondansetron [facility]) injection 4 mg  4 mg Intravenous Q6H PRN Virl Cagey, MD      . piperacillin-tazobactam (ZOSYN) IVPB 3.375 g  3.375 g Intravenous Q8H [name] C, MD      . potassium chloride 10 mEq in 100 mL IVPB  10 mEq Intravenous Q1 Hr x 3 Virl Cagey, MD 100 mL/hr at 10/20/20 1400 Rate Verify at 10/20/20 1400  . simethicone (MYLICON)  chewable tablet 40 mg  40 mg Oral Q6H PRN Virl Cagey, MD      . sodium chloride flush (NS) 0.9 % injection 10-40 mL  10-40 mL Intracatheter Q12H Virl Cagey, MD   10 mL at 10/20/20 0918  . sodium chloride flush (NS) 0.9 % injection 10-40 mL  10-40 mL Intracatheter PRN Virl Cagey, MD   10 mL at 10/19/20 1415  . tamsulosin (FLOMAX) capsule 0.4 mg  0.4 mg Oral QHS Virl Cagey, MD   0.4 mg at 10/19/20 2152  . umeclidinium bromide (INCRUSE ELLIPTA) 62.5 MCG/INH 1 puff  1 puff Inhalation Daily Virl Cagey, MD   1  puff at 10/20/20 0827     Discharge Medications: Please see discharge summary for a list of discharge medications.  Relevant Imaging Results:  Relevant Lab Results:   Additional Information SSN: [id number]  Sherie Don, LCSW

## 2020-10-20 NOTE — Progress Notes (Signed)
[facility]  Cultures with pseudomonas sensitive to zosyn. These were intraoperative of the abscess cavity associated with the small intestine. Given the contamination will treat for 4 days. Pharmacy to dose ordered. Updated Dr. Manuella Ghazi.  Pathology otherwise expected with old abscess cavity/ fat necrosis on the small bowel but with some luminal stenosis so it is good that this was resected, and colostomy end also with fat necrosis and abscess cavity.  [name], MD [facility] [address] [place], [place] [address] [phone] (office)

## 2020-10-20 NOTE — Progress Notes (Signed)
[facility] Progress Note  2 Days Post-Op  Subjective: Up in chair and feeling good. No pain. Ostomy site skin excoriated from wafer preop and tape. Removed bandages given skin excoriation.   No flatus or BM. Minimal NG output.  New onset Afib and ECHO ordered. Likely fluid shifts. Appreciate Dr. Trena Platt help.   Objective: Vital signs in last 24 hours: Temp:  [97.7 F (36.5 C)-98.3 F (36.8 C)] 97.7 F (36.5 C) (12/03 1115) Pulse Rate:  [62-115] 114 (12/03 1200) Resp:  [13-24] 21 (12/03 1200) BP: (104-136)/(41-67) 131/56 (12/03 1200) SpO2:  [87 %-98 %] 98 % (12/03 1200) Weight:  [110.6 kg] 110.6 kg (12/03 0400)    Intake/Output from previous day: 12/02 0701 - 12/03 0700 In: 1555 [I.V.:1555] Out: 500 [Urine:500] Intake/Output this shift: Total I/O In: 299.9 [P.O.:50; I.V.:249.9] Out: 100 [Emesis/NG output:100]  General appearance: alert, cooperative and no distress Resp: normal work of breathing GI: soft, nondistended, staples midline c/d/i with no erythema or drainage, ostomy site with some drainage SS, gauze reapplied, skin around ostomy excoriated from preop wafer, no signs of cellulitis at the present  Lab Results:  Recent Labs    10/19/20 0323 10/20/20 0514  WBC 8.8 9.5  HGB 11.6* 10.7*  HCT 37.4* 33.6*  PLT 202 211   BMET Recent Labs    10/19/20 0323 10/20/20 0514  NA 135 137  K 4.9 3.5  CL 104 104  CO2 23 24  GLUCOSE 129* 87  BUN 17 14  CREATININE 0.99 1.02  CALCIUM 8.4* 8.5*    Studies/Results: DG Chest Portable 1 View  Result Date: 10/19/2020 CLINICAL DATA:  Central catheter placement EXAM: PORTABLE CHEST 1 VIEW COMPARISON:  October 18, 2020 FINDINGS: Central catheter tip is in the superior vena cava. Nasogastric tube tip and side port are below the diaphragm. No pneumothorax. There is a small pleural effusion on each side with mild bibasilar atelectasis. No consolidation evident. Heart is mildly enlarged with pulmonary  vascularity normal. No adenopathy. There is aortic atherosclerosis. No bone lesions. IMPRESSION: Tube and catheter positions as described without pneumothorax. Small pleural effusions bilaterally with mild bibasilar atelectasis. No frank edema or consolidation. Stable cardiac prominence. Aortic Atherosclerosis (ICD10-I70.0). Electronically Signed   By: Lowella Grip III M.D.   On: 10/19/2020 13:49   DG Chest Port 1 View  Result Date: 10/18/2020 CLINICAL DATA:  79 year old male status post NG placement. EXAM: PORTABLE CHEST 1 VIEW COMPARISON:  Chest radiograph dated 08/30/2020. FINDINGS: Enteric tube with tip and side-port in the proximal stomach. There is mild cardiomegaly with mild vascular congestion. Left lung base density, likely atelectasis. A trace left pleural effusion is not excluded. No pneumothorax. Atherosclerotic calcification of the aorta. No acute osseous pathology. IMPRESSION: 1. Enteric tube with tip and side-port in the proximal stomach. 2. Cardiomegaly with mild vascular congestion. Electronically Signed   By: Anner Crete M.D.   On: 10/18/2020 15:36   [place] EKG SITE RITE  Result Date: 10/18/2020 If Site Rite image not attached, placement could not be confirmed due to current cardiac rhythm.   Anti-infectives: Anti-infectives (From admission, onward)   Start     Dose/Rate Route Frequency Ordered Stop   10/18/20 2200  cefoTEtan (CEFOTAN) 2 g in sodium chloride 0.9 % 100 mL IVPB        2 g 200 mL/hr over 30 Minutes Intravenous Every 12 hours 10/18/20 1434 10/19/20 2223   10/18/20 0630  cefoTEtan (CEFOTAN) 2 g in sodium chloride 0.9 % 100 mL  IVPB        2 g 200 mL/hr over 30 Minutes Intravenous On call to O.R. 10/18/20 2518 10/18/20 0820      Assessment/Plan: Mr. Noah is a 79 yo s/p Colostomy reversal and SBR. Doing well overall. No return of bowel function but minimal from NG so likely can pull in next 24-48 hours. Scheduled tylenol PRN narcotics IS, OOB, COPD  inhalers Telemetry bed, A fib asymptomatic, appreciate Dr. Trena Platt assistance, metoprolol IV and diltiazem ordered  NPO and NG for now, hopefully can pull this weekend  UOP adequate, K low, replaced, D51/2NS @ 50 SCDs, heparin sq- on Eliquis preop for prior remote DVT, will need to resume, may can do tomorrow if H&H stabilized, labs in AM PICC in place Ostomy with gauze dressing daily for drainage, excoriated skin around ostomy known and monitoring  Dr. Arnoldo Morale seeing tomorrow.  Tried to call to update [name] but went to voicemail and mailbox was full.  Updated Dr. Manuella Ghazi.    LOS: 2 days    Virl Cagey 10/20/2020

## 2020-10-20 NOTE — Evaluation (Signed)
Physical Therapy Evaluation Patient Details Name: Vincent Cooper MRN: [id number] DOB: Jan 14, 1941 Today's Date: 10/20/2020   History of Present Illness  Vincent Cooper is an 79 y.o. 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  He has medical problems to include chronic gout, prior PE on Eliquis, COPD, CAD, type 2 diabetes-well controlled, hypertension, OSA on CPAP, BPH, and obesity.  He appears to have tolerated the procedure well and is currently n.p.o except for medications.  He denies any significant pain currently and is otherwise resting comfortably.  We have been asked to consult for medical management during his hospitalization.  He is currently n.p.o. with NG tube to low intermittent suction.    Clinical Impression  Patient limited for functional mobility as stated below secondary to BLE weakness, fatigue and poor standing balance. Patient seated in chair at beginning of session and requesting to return to bed. Patient shows good sitting balance edge of chair but states sitting in chair was making his buttocks hurt. Patient requires mod assist to transfer to standing with HHA and assist to power up secondary to bilateral LE weakness. Patient very unsteady upon standing requiring HHA and reaching for nearby bed rails for support. Patient limited to several slow, labored, shuffled steps at bedside to ambulate to bed. Patient requires assist to transfer LE into bed due to weakness. Patient ends session supine in bed with HOB elevated. Patient will benefit from continued physical therapy in hospital and recommended venue below to increase strength, balance, endurance for safe ADLs and gait.     Follow Up Recommendations SNF    Equipment Recommendations  None recommended by PT    Recommendations for Other Services       Precautions  / Restrictions Precautions Precautions: Fall Precaution Comments: NG tube Restrictions Weight Bearing Restrictions: No      Mobility  Bed Mobility Overal bed mobility: Needs Assistance Bed Mobility: Sit to Supine       Sit to supine: Mod assist   General bed mobility comments: for BLE back into bed    Transfers Overall transfer level: Needs assistance Equipment used: 1 person hand held assist Transfers: Sit to/from [employer] Transfers Sit to Stand: Mod assist Stand pivot transfers: Mod assist       General transfer comment: slow, labored transition from sit to stand requiring several attempts to transfer to standing, unsteady upon initial standing reaching for bed rails nearby  Ambulation/Gait Ambulation/Gait assistance: Mod assist Gait Distance (Feet): 2 Feet Assistive device: 1 person hand held assist Gait Pattern/deviations: Shuffle Gait velocity: decreased   General Gait Details: patient limited to several shuffled steps at bedside to ambulate from chair to bed requiring HHA and support on bed  Stairs            Wheelchair Mobility    Modified Rankin (Stroke Patients Only)       Balance Overall balance assessment: Needs assistance Sitting-balance support: Feet supported Sitting balance-Leahy Scale: Normal Sitting balance - Comments: seated edge of chair   Standing balance support: No upper extremity supported Standing balance-Leahy Scale: Poor Standing balance comment: poor without AD                             Pertinent Vitals/Pain Pain Assessment: No/denies pain    Home Living Family/patient expects to be discharged to:: Private residence  Living Arrangements: Alone Available Help at Discharge: Family;Available PRN/intermittently Type of Home: Mobile home       Home Layout: One level Home Equipment: [place] - 2 wheels;Shower seat;Adaptive equipment;Cane - single point      Prior Function Level of Independence:  Independent with assistive device(s)         Comments: Patient states independent with basic ADL with SPC, RW; can ambulate about 500 feet at one time     Hand Dominance        Extremity/Trunk Assessment   Upper Extremity Assessment Upper Extremity Assessment: Generalized weakness    Lower Extremity Assessment Lower Extremity Assessment: Generalized weakness    Cervical / Trunk Assessment Cervical / Trunk Assessment: Normal  Communication   Communication: No difficulties  Cognition Arousal/Alertness: Awake/alert Behavior During Therapy: WFL for tasks assessed/performed Overall Cognitive Status: Within Functional Limits for tasks assessed                                        General Comments      Exercises     Assessment/Plan    PT Assessment Patient needs continued PT services  PT Problem List Decreased strength;Decreased mobility;Decreased activity tolerance;Decreased balance;Decreased knowledge of use of DME       PT Treatment Interventions Therapeutic exercise;DME instruction;Gait training;Balance training;Stair training;Neuromuscular re-education;Functional mobility training;Therapeutic activities;Patient/family education    PT Goals (Current goals can be found in the Care Plan section)  Acute Rehab PT Goals Patient Stated Goal: Return home PT Goal Formulation: With patient Time For Goal Achievement: 10/27/20 Potential to Achieve Goals: Good    Frequency Min 3X/week   Barriers to discharge        Co-evaluation               AM-PAC PT "6 Clicks" Mobility  Outcome Measure Help needed turning from your back to your side while in a flat bed without using bedrails?: None Help needed moving from lying on your back to sitting on the side of a flat bed without using bedrails?: A Little Help needed moving to and from a bed to a chair (including a wheelchair)?: A Lot Help needed standing up from a chair using your arms (e.g.,  wheelchair or bedside chair)?: A Lot Help needed to walk in hospital room?: A Lot Help needed climbing 3-5 steps with a railing? : Total 6 Click Score: 14    End of Session Equipment Utilized During Treatment: Other (comment) (abdominal binder) Activity Tolerance: Patient tolerated treatment well;Patient limited by fatigue Patient left: in bed;with call bell/phone within reach Nurse Communication: Mobility status PT Visit Diagnosis: Unsteadiness on feet (R26.81);Other abnormalities of gait and mobility (R26.89);Muscle weakness (generalized) (M62.81)    Time: 9323-5573 PT Time Calculation (min) (ACUTE ONLY): 19 min   Charges:   PT Evaluation $PT Eval Low Complexity: 1 Low PT Treatments $Therapeutic Activity: 8-22 mins         2:44 PM, 10/20/20 Mearl Latin PT, DPT Physical Therapist at [facility]

## 2020-10-20 NOTE — Progress Notes (Signed)
*  PRELIMINARY RESULTS* Echocardiogram 2D Echocardiogram has been performed.  Vincent Cooper 10/20/2020, 1:10 PM

## 2020-10-20 NOTE — Progress Notes (Addendum)
Pharmacy Antibiotic Note  Vincent Cooper is a 79 y.o. male admitted on 10/18/2020 with pseudomonal abscess of small intestinal cavity.  Pharmacy has been consulted for Zosyn dosing to treat this contamination   for a total of four days.  Plan:   Zosyn 3.375g IV q8h (4-hr infusion) x4 days Pharmacy will continue to monitor renal function, cultures and patient progress.  Height: 5\' 9"  (175.3 cm) Weight: 110.6 kg (243 lb 13.3 oz) IBW/kg (Calculated) : 70.7  Temp (24hrs), Avg:98 F (36.7 C), Min:97.7 F (36.5 C), Max:98.3 F (36.8 C)  Recent Labs  Lab 10/16/20 0950 10/19/20 0323 10/20/20 0514  WBC 6.3 8.8 9.5  CREATININE 1.12 0.99 1.02    Estimated Creatinine Clearance: 73.2 mL/min (by C-G formula based on SCr of 1.02 mg/dL).    Allergies  Allergen Reactions  . Other     Patient reports he was allergic to something in an IV he was given but does not know what the substance was. As of 01/19/2020     Antimicrobials this admission: Zosyn 12/3>>    Microbiology results: 12/1 Wound cx: few pseudomonas, sensitive to Zosyn    Thank you for allowing pharmacy to be a part of this patient's care.  Despina Pole 10/20/2020 2:38 PM

## 2020-10-20 NOTE — Plan of Care (Signed)
°  Problem: Acute Rehab PT Goals(only PT should resolve) Goal: Pt Will Go Supine/Side To Sit Outcome: Progressing Flowsheets (Taken 10/20/2020 1445) Pt will go Supine/Side to Sit: with minimal assist Goal: Pt Will Go Sit To Supine/Side Outcome: Progressing Flowsheets (Taken 10/20/2020 1445) Pt will go Sit to Supine/Side: with supervision Goal: Patient Will Transfer Sit To/From Stand Outcome: Progressing Flowsheets (Taken 10/20/2020 1445) Patient will transfer sit to/from stand: with min guard assist Goal: Pt Will Transfer Bed To Chair/Chair To Bed Outcome: Progressing Flowsheets (Taken 10/20/2020 1445) Pt will Transfer Bed to Chair/Chair to Bed: min guard assist Goal: Pt Will Ambulate Outcome: Progressing Flowsheets (Taken 10/20/2020 1445) Pt will Ambulate:  25 feet  with min guard assist  with least restrictive assistive device   2:45 PM, 10/20/20 Mearl Latin PT, DPT Physical Therapist at [facility]

## 2020-10-21 DIAGNOSIS — Z933 Colostomy status: Secondary | ICD-10-CM | POA: Diagnosis not present

## 2020-10-21 LAB — BASIC METABOLIC PANEL
Anion gap: 10 (ref 5–15)
BUN: 13 mg/dL (ref 8–23)
CO2: 22 mmol/L (ref 22–32)
Calcium: 8.4 mg/dL — ABNORMAL LOW (ref 8.9–10.3)
Chloride: 103 mmol/L (ref 98–111)
Creatinine, Ser: 0.96 mg/dL (ref 0.61–1.24)
GFR, Estimated: >60 mL/min (ref >60)
Glucose, Bld: 99 mg/dL (ref 70–99)
Potassium: 3.5 mmol/L (ref 3.5–5.1)
Sodium: 135 mmol/L (ref 135–145)

## 2020-10-21 LAB — CBC WITH DIFFERENTIAL/PLATELET
Abs Immature Granulocytes: 0.03 10*3/uL (ref 0.00–0.07)
Basophils Absolute: 0 10*3/uL (ref 0.0–0.1)
Basophils Relative: 1 %
Eosinophils Absolute: 0.6 10*3/uL — ABNORMAL HIGH (ref 0.0–0.5)
Eosinophils Relative: 8 %
HCT: 35.7 % — ABNORMAL LOW (ref 39.0–52.0)
Hemoglobin: 11.2 g/dL — ABNORMAL LOW (ref 13.0–17.0)
Immature Granulocytes: 0 %
Lymphocytes Relative: 11 %
Lymphs Abs: 0.8 10*3/uL (ref 0.7–4.0)
MCH: 29.3 pg (ref 26.0–34.0)
MCHC: 31.4 g/dL (ref 30.0–36.0)
MCV: 93.5 fL (ref 80.0–100.0)
Monocytes Absolute: 0.7 10*3/uL (ref 0.1–1.0)
Monocytes Relative: 9 %
Neutro Abs: 5.4 10*3/uL (ref 1.7–7.7)
Neutrophils Relative %: 71 %
Platelets: 204 10*3/uL (ref 150–400)
RBC: 3.82 MIL/uL — ABNORMAL LOW (ref 4.22–5.81)
RDW: 17.1 % — ABNORMAL HIGH (ref 11.5–15.5)
WBC: 7.6 10*3/uL (ref 4.0–10.5)
nRBC: 0 % (ref 0.0–0.2)

## 2020-10-21 LAB — GLUCOSE, CAPILLARY
Glucose-Capillary: 92 mg/dL (ref 70–99)
Glucose-Capillary: 89 mg/dL (ref 70–99)
Glucose-Capillary: 138 mg/dL — ABNORMAL HIGH (ref 70–99)
Glucose-Capillary: 86 mg/dL (ref 70–99)

## 2020-10-21 LAB — T4, FREE: Free T4: 1.03 ng/dL (ref 0.61–1.12)

## 2020-10-21 LAB — MAGNESIUM: Magnesium: 1.7 mg/dL (ref 1.7–2.4)

## 2020-10-21 MED ORDER — APIXABAN 2.5 MG PO TABS
2.5000 mg | ORAL_TABLET | Freq: Two times a day (BID) | ORAL | Status: DC
  Administered 2020-10-21 – 2020-10-23 (×5): 2.5 mg via ORAL
  Filled 2020-10-21 (×5): qty 1

## 2020-10-21 MED ORDER — INSULIN DETEMIR 100 UNIT/ML ~~LOC~~ SOLN
3.0000 [IU] | Freq: Two times a day (BID) | SUBCUTANEOUS | Status: DC
  Administered 2020-10-21 – 2020-10-23 (×5): 3 [IU] via SUBCUTANEOUS
  Filled 2020-10-21 (×7): qty 0.03

## 2020-10-21 NOTE — Progress Notes (Signed)
PROGRESS NOTE    Vincent Cooper  UKG:[id number] DOB: 07/27/1941 DOA: 10/18/2020 PCP: Manon Hilding, MD   Brief Narrative:  Vincent Cooper an 79 y.o.malewith prior history of sigmoid colon resection and colostomy for complicated diverticulitis who has undergone colostomy reversal with side-to-side anastomosis and small bowel resection with primary anastomosis. He has medical problems to include chronic gout,prior PE on Eliquis,COPD, CAD, type 2 diabetes-well controlled, hypertension, OSA on CPAP, BPH,andobesity. He appears to have tolerated the procedure well and is currently n.p.o except for medications.He denies any significant pain currently and is otherwise resting comfortably. We have been asked to consult for medical management during his hospitalization. He is currently n.p.o. with NG tube to low intermittent suction.   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[place])   Colostomy reversal with side-to-side anastomosis -Dietary advancement to full liquid  New onset afib-rate controlled; asymptomatic -Confirmed on EKG with heart rate afib in the 80 bpm range -TSH noted at 4.9, check free T4 - 2D echocardiogram with LVEF 60-65% -Continue on metoprolol and diltiazem for heart rate control with adequate rate control noted and no symptomatology -CHA2DS2-VASc of 4 noted and patient already on Eliquis at home for prior PE to resume -Plan to follow-up outpatient with cardiology  History of type 2 diabetes -Patient noted to be on SSI  -Recent hemoglobin A1c 4.7% on 10/16/2020 -Holding home glimepiride for now which is appropriate -Plan to resume long-acting insulin at half dose -BG well controlled  History of hypertension/CAD -Blood pressures currently controlled -Continue metoprolol and diltiazem as ordered with good heart rate control noted -Appropriate to hold Crestor for now as well as torsemide until  diet further advanced  History ofCOPD -Patient does not wear home oxygen -Use breathing treatments as needed for shortness of breath or wheezing as ordered -No acute bronchospasms currently noted  History of BPH -Continue tamsulosin  History of PE -Eliquis resumed  Morbid obesity/OSA -Lifestyle changes outpatient -Uses CPAP at night whichhas been reordered   DVT prophylaxis: Eliquis Code Status:Full Family Communication: No family currently at bedside Disposition Plan: Status is: Inpatient  Remains inpatient appropriate because:IV treatments appropriate due to intensity of illness or inability to take PO and Inpatient level of care appropriate due to severity of illness   Dispo: The patient is from:Home Anticipated d/c is JS:EGBT Anticipated d/c date is: 2-3 days Patient currently is not medically stable to d/c.  Procedures:  POD #3  Antimicrobials:  Anti-infectives (From admission, onward)   Start     Dose/Rate Route Frequency Ordered Stop   10/20/20 1530  piperacillin-tazobactam (ZOSYN) IVPB 3.375 g        3.375 g 12.5 mL/hr over 240 Minutes Intravenous Every 8 hours 10/20/20 1436     10/20/20 1515  piperacillin-tazobactam (ZOSYN) IVPB 3.375 g  Status:  Discontinued        3.375 g 100 mL/hr over 30 Minutes Intravenous  Once 10/20/20 1425 10/20/20 1426   10/18/20 2200  cefoTEtan (CEFOTAN) 2 g in sodium chloride 0.9 % 100 mL IVPB        2 g 200 mL/hr over 30 Minutes Intravenous Every 12 hours 10/18/20 1434 10/19/20 2223   10/18/20 0630  cefoTEtan (CEFOTAN) 2 g in sodium chloride 0.9 % 100 mL IVPB        2 g 200 mL/hr over 30 Minutes Intravenous On call to O.R. 10/18/20 5176 10/18/20 0820  Subjective: Patient seen and evaluated today with no new acute complaints or concerns. No acute concerns or events noted overnight.  Objective: Vitals:   10/20/20 2112 10/21/20 0440 10/21/20 0500 10/21/20 0757    BP: 118/65 (!) 116/58    Pulse: 68 73    Resp: 18 20    Temp: 98.4 F (36.9 C) 98 F (36.7 C)    TempSrc:      SpO2: 93% 95%  94%  Weight:   111 kg   Height:        Intake/Output Summary (Last 24 hours) at 10/21/2020 1107 Last data filed at 10/21/2020 0300 Gross per 24 hour  Intake 404.65 ml  Output 625 ml  Net -220.35 ml   Filed Weights   10/19/20 0500 10/20/20 0400 10/21/20 0500  Weight: 109.4 kg 110.6 kg 111 kg    Examination:  General exam: Appears calm and comfortable  Respiratory system: Clear to auscultation. Respiratory effort normal.  Currently on nasal cannula oxygen. Cardiovascular system: S1 & S2 heard, irregular, rate controlled. Gastrointestinal system: Abdomen with binder present.  Incisions clean dry and intact. Central nervous system: Alert and awake Extremities: No edema Skin: No rashes, lesions or ulcers Psychiatry: Judgement and insight appear normal. Mood & affect appropriate.     Data Reviewed: I have personally reviewed following labs and imaging studies  CBC: Recent Labs  Lab 10/16/20 0950 10/19/20 0323 10/20/20 0514 10/21/20 0511  WBC 6.3 8.8 9.5 7.6  NEUTROABS 3.7 6.9  --  5.4  HGB 11.8* 11.6* 10.7* 11.2*  HCT 38.3* 37.4* 33.6* 35.7*  MCV 95.5 95.4 93.6 93.5  PLT 247 202 211 161   Basic Metabolic Panel: Recent Labs  Lab 10/16/20 0950 10/19/20 0323 10/20/20 0514 10/21/20 0511  NA 137 135 137 135  K 4.0 4.9 3.5 3.5  CL 104 104 104 103  CO2 26 23 24 22   GLUCOSE 81 129* 87 99  BUN 20 17 14 13   CREATININE 1.12 0.99 1.02 0.96  CALCIUM 9.0 8.4* 8.5* 8.4*  MG  --  1.7 1.7 1.7  PHOS  --  5.0*  --   --    GFR: Estimated Creatinine Clearance: 77.9 mL/min (by C-G formula based on SCr of 0.96 mg/dL). Liver Function Tests: Recent Labs  Lab 10/16/20 0950  AST 24  ALT 28  ALKPHOS 40  BILITOT 0.7  PROT 6.3*  ALBUMIN 3.4*   No results for input(s): LIPASE, AMYLASE in the last 168 hours. No results for input(s): AMMONIA in  the last 168 hours. Coagulation Profile: No results for input(s): INR, PROTIME in the last 168 hours. Cardiac Enzymes: No results for input(s): CKTOTAL, CKMB, CKMBINDEX, TROPONINI in the last 168 hours. BNP (last 3 results) No results for input(s): PROBNP in the last 8760 hours. HbA1C: No results for input(s): HGBA1C in the last 72 hours. CBG: Recent Labs  Lab 10/20/20 0638 10/20/20 1114 10/20/20 1620 10/20/20 2331 10/21/20 0528  GLUCAP 85 75 82 88 92   Lipid Profile: No results for input(s): CHOL, HDL, LDLCALC, TRIG, CHOLHDL, LDLDIRECT in the last 72 hours. Thyroid Function Tests: Recent Labs    10/20/20 1535  TSH 4.978*   Anemia Panel: No results for input(s): VITAMINB12, FOLATE, FERRITIN, TIBC, IRON, RETICCTPCT in the last 72 hours. Sepsis Labs: No results for input(s): PROCALCITON, LATICACIDVEN in the last 168 hours.  Recent Results (from the past 240 hour(s))  SARS CORONAVIRUS 2 (TAT 6-24 HRS) Nasopharyngeal Nasopharyngeal Swab     Status: None  Collection Time: 10/16/20  9:00 AM   Specimen: Nasopharyngeal Swab  Result Value Ref Range Status   SARS Coronavirus 2 NEGATIVE NEGATIVE Final    Comment: (NOTE) SARS-CoV-2 target nucleic acids are NOT DETECTED.  The SARS-CoV-2 RNA is generally detectable in upper and lower respiratory specimens during the acute phase of infection. Negative results do not preclude SARS-CoV-2 infection, do not rule out co-infections with other pathogens, and should not be used as the sole basis for treatment or other patient management decisions. Negative results must be combined with clinical observations, patient history, and epidemiological information. The expected result is Negative.  Fact Sheet for Patients: SugarRoll.be  Fact Sheet for Healthcare Providers: https://www.woods-mathews.com/  This test is not yet approved or cleared by the [place] FDA and  has been authorized for  detection and/or diagnosis of SARS-CoV-2 by FDA under an Emergency Use Authorization (EUA). This EUA will remain  in effect (meaning this test can be used) for the duration of the COVID-19 declaration under Se ction 564(b)(1) of the Act, 21 U.S.C. section 360bbb-3(b)(1), unless the authorization is terminated or revoked sooner.  Performed at [facility], [place] [address] [place], [place] [address]   Aerobic/Anaerobic Culture (surgical/deep wound)     Status: None (Preliminary result)   Collection Time: 10/18/20  8:52 AM   Specimen: PATH Other  Result Value Ref Range Status   Specimen Description   Final    WOUND Performed at [facility], [address], [place], [place] [address]    Special Requests   Final    NONE Performed at [facility], [address] [place], [place] [address]    Gram Stain   Final    RARE WBC PRESENT, PREDOMINANTLY PMN FEW GRAM NEGATIVE RODS Performed at [facility], [place] [address], [place], [place] [address]    Culture   Final    FEW PSEUDOMONAS AERUGINOSA NO ANAEROBES ISOLATED; CULTURE IN PROGRESS FOR 5 DAYS    Report Status PENDING  Incomplete   Organism ID, Bacteria PSEUDOMONAS AERUGINOSA  Final      Susceptibility   Pseudomonas aeruginosa - MIC*    CEFTAZIDIME 4 SENSITIVE Sensitive     CIPROFLOXACIN <=0.25 SENSITIVE Sensitive     GENTAMICIN 4 SENSITIVE Sensitive     IMIPENEM 1 SENSITIVE Sensitive     PIP/TAZO 8 SENSITIVE Sensitive     CEFEPIME 0.5 SENSITIVE Sensitive     * FEW PSEUDOMONAS AERUGINOSA         Radiology Studies: DG Chest Portable 1 View  Result Date: 10/19/2020 CLINICAL DATA:  Central catheter placement EXAM: PORTABLE CHEST 1 VIEW COMPARISON:  October 18, 2020 FINDINGS: Central catheter tip is in the superior vena cava. Nasogastric tube tip and side port are below the diaphragm. No pneumothorax. There is a small pleural effusion on each side with mild bibasilar atelectasis. No consolidation evident. Heart is  mildly enlarged with pulmonary vascularity normal. No adenopathy. There is aortic atherosclerosis. No bone lesions. IMPRESSION: Tube and catheter positions as described without pneumothorax. Small pleural effusions bilaterally with mild bibasilar atelectasis. No frank edema or consolidation. Stable cardiac prominence. Aortic Atherosclerosis (ICD10-I70.0). Electronically Signed   By: Lowella Grip III M.D.   On: 10/19/2020 13:49   ECHOCARDIOGRAM COMPLETE  Result Date: 10/20/2020    ECHOCARDIOGRAM REPORT   Patient Name:   Vincent Cooper Date of Exam: 10/20/2020 Medical Rec #:  [id number]   Height:       69.0 in Accession #:  [id number]  Weight:       243.8 lb Date of Birth:  [birth year]   BSA:          2.248 m Patient Age:    32 years    BP:           131/56 mmHg Patient Gender: M           HR:           114 bpm. Exam Location:  [name] Procedure: 2D Echo Indications:    Atrial Fibrillation 427.31 / I48.91  History:        Patient has prior history of Echocardiogram examinations, most                 recent 11/07/2019. COPD, Signs/Symptoms:Chest Pain and Murmur;                 Risk Factors:Former Smoker, Dyslipidemia, Hypertension and                 Diabetes. Bowel Perforation, Pulmonary Embolism.  Sonographer:    Leavy Cella RDCS (AE) Referring Phys: [id number] Royanne Foots [place]  1. Left ventricular ejection fraction, by estimation, is 60 to 65%. The left ventricle has normal function. The left ventricle has no regional wall motion abnormalities. There is moderate left ventricular hypertrophy. Left ventricular diastolic parameters are indeterminate.  2. Right ventricular systolic function is normal. The right ventricular size is normal.  3. Left atrial size was mild to moderately dilated.  4. The mitral valve is normal in structure. No evidence of mitral valve regurgitation. No evidence of mitral stenosis.  5. The aortic valve has an indeterminant number of cusps. There is moderate calcification of  the aortic valve. There is moderate thickening of the aortic valve. Aortic valve regurgitation is not visualized. No aortic stenosis is present.  6. The inferior vena cava is normal in size with greater than 50% respiratory variability, suggesting right atrial pressure of 3 mmHg. FINDINGS  Left Ventricle: Left ventricular ejection fraction, by estimation, is 60 to 65%. The left ventricle has normal function. The left ventricle has no regional wall motion abnormalities. The left ventricular internal cavity size was normal in size. There is  moderate left ventricular hypertrophy. Left ventricular diastolic parameters are indeterminate. Right Ventricle: The right ventricular size is normal. No increase in right ventricular wall thickness. Right ventricular systolic function is normal. Left Atrium: Left atrial size was mild to moderately dilated. Right Atrium: Right atrial size was normal in size. Pericardium: There is no evidence of pericardial effusion. Mitral Valve: The mitral valve is normal in structure. No evidence of mitral valve regurgitation. No evidence of mitral valve stenosis. Tricuspid Valve: The tricuspid valve is normal in structure. Tricuspid valve regurgitation is mild . No evidence of tricuspid stenosis. Aortic Valve: The aortic valve has an indeterminant number of cusps. There is moderate calcification of the aortic valve. There is moderate thickening of the aortic valve. There is moderate aortic valve annular calcification. Aortic valve regurgitation is not visualized. No aortic stenosis is present. Pulmonic Valve: The pulmonic valve was not well visualized. Pulmonic valve regurgitation is not visualized. No evidence of pulmonic stenosis. Aorta: The aortic root is normal in size and structure. Pulmonary Artery: 20. Venous: The inferior vena cava is normal in size with greater than 50% respiratory variability, suggesting right atrial pressure of 3 mmHg. IAS/Shunts: No atrial level shunt detected by  color flow Doppler.  LEFT VENTRICLE PLAX 2D LVIDd:  4.27 cm  Diastology LVIDs:         2.85 cm  LV e' medial:    12.10 cm/s LV PW:         1.30 cm  LV E/e' medial:  7.0 LV IVS:        1.24 cm  LV e' lateral:   10.10 cm/s LVOT diam:     2.10 cm  LV E/e' lateral: 8.4 LV SV:         56 LV SV Index:   25 LVOT Area:     3.46 cm  RIGHT VENTRICLE RV S prime:     12.10 cm/s TAPSE (M-mode): 1.6 cm LEFT ATRIUM           Index       RIGHT ATRIUM           Index LA diam:      3.50 cm 1.56 cm/m  RA Area:     19.60 cm LA Vol (A2C): 77.1 ml 34.30 ml/m RA Volume:   58.70 ml  26.12 ml/m LA Vol (A4C): 72.5 ml 32.26 ml/m  AORTIC VALVE LVOT Vmax:   89.12 cm/s LVOT Vmean:  57.200 cm/s LVOT VTI:    0.161 m  AORTA Ao Root diam: 3.10 cm MITRAL VALVE               TRICUSPID VALVE MV Area (PHT): 3.08 cm    TR Peak grad:   19.7 mmHg MV Decel Time: 246 msec    TR Vmax:        222.00 cm/s MV E velocity: 85.30 cm/s                            SHUNTS                            Systemic VTI:  0.16 m                            Systemic Diam: 2.10 cm Carlyle Dolly MD Electronically signed by Carlyle Dolly MD Signature Date/Time: 10/20/2020/2:54:10 PM    Final         Scheduled Meds: . sodium chloride   Intravenous Once  . acetaminophen  1,000 mg Oral Q6H  . apixaban  2.5 mg Oral BID  . Chlorhexidine Gluconate Cloth  6 each Topical Daily  . diltiazem  360 mg Oral Daily  . fluticasone furoate-vilanterol  1 puff Inhalation Daily  . insulin aspart  0-20 Units Subcutaneous Q6H  . metoprolol tartrate  25 mg Oral BID  . sodium chloride flush  10-40 mL Intracatheter Q12H  . tamsulosin  0.4 mg Oral QHS  . umeclidinium bromide  1 puff Inhalation Daily   Continuous Infusions: . piperacillin-tazobactam (ZOSYN)  IV 3.375 g (10/21/20 0533)     LOS: 3 days    Time spent: 30 minutes    Siria Calandro Darleen Crocker, DO [facility]  If 7PM-7AM, please contact night-coverage [email or web address] 10/21/2020, 11:07 AM

## 2020-10-21 NOTE — Plan of Care (Signed)

## 2020-10-21 NOTE — Progress Notes (Signed)
3 Days Post-Op  Subjective: Patient is passing gas and had a small bowel movement.  His NG tube is now present.  He denies any abdominal pain.  Objective: Vital signs in last 24 hours: Temp:  [97.6 F (36.4 C)-98.4 F (36.9 C)] 98 F (36.7 C) (12/04 0440) Pulse Rate:  [62-115] 73 (12/04 0440) Resp:  [14-24] 20 (12/04 0440) BP: (100-131)/(39-65) 116/58 (12/04 0440) SpO2:  [93 %-98 %] 94 % (12/04 0757) Weight:  [891 kg] 111 kg (12/04 0500)    Intake/Output from previous day: 12/03 0701 - 12/04 0700 In: 604.6 [P.O.:50; I.V.:346.3; IV Piggyback:208.3] Out: 875 [Urine:750; Emesis/NG output:125] Intake/Output this shift: No intake/output data recorded.  General appearance: alert, cooperative and no distress Resp: clear to auscultation bilaterally Cardio: regular rate and rhythm, S1, S2 normal, no murmur, click, rub or gallop GI: Soft, incision healing well.  No drainage noted from the incision.  Bowel sounds present.  Lab Results:  Recent Labs    10/20/20 0514 10/21/20 0511  WBC 9.5 7.6  HGB 10.7* 11.2*  HCT 33.6* 35.7*  PLT 211 204   BMET Recent Labs    10/20/20 0514 10/21/20 0511  NA 137 135  K 3.5 3.5  CL 104 103  CO2 24 22  GLUCOSE 87 99  BUN 14 13  CREATININE 1.02 0.96  CALCIUM 8.5* 8.4*   PT/INR No results for input(s): LABPROT, INR in the last 72 hours.  Studies/Results: DG Chest Portable 1 View  Result Date: 10/19/2020 CLINICAL DATA:  Central catheter placement EXAM: PORTABLE CHEST 1 VIEW COMPARISON:  October 18, 2020 FINDINGS: Central catheter tip is in the superior vena cava. Nasogastric tube tip and side port are below the diaphragm. No pneumothorax. There is a small pleural effusion on each side with mild bibasilar atelectasis. No consolidation evident. Heart is mildly enlarged with pulmonary vascularity normal. No adenopathy. There is aortic atherosclerosis. No bone lesions. IMPRESSION: Tube and catheter positions as described without pneumothorax.  Small pleural effusions bilaterally with mild bibasilar atelectasis. No frank edema or consolidation. Stable cardiac prominence. Aortic Atherosclerosis (ICD10-I70.0). Electronically Signed   By: Lowella Grip III M.D.   On: 10/19/2020 13:49   ECHOCARDIOGRAM COMPLETE  Result Date: 10/20/2020    ECHOCARDIOGRAM REPORT   Patient Name:   Vincent Cooper Date of Exam: 10/20/2020 Medical Rec #:  [id number]   Height:       69.0 in Accession #:    [id number]  Weight:       243.8 lb Date of Birth:  24-Apr-1941   BSA:          2.248 m Patient Age:    48 years    BP:           131/56 mmHg Patient Gender: M           HR:           114 bpm. [facility]am Location:  [name] Procedure: 2D Echo Indications:    Atrial Fibrillation 427.31 / I48.91  History:        Patient has prior history of Echocardiogram examinations, most                 recent 11/07/2019. COPD, Signs/Symptoms:Chest Pain and Murmur;                 Risk Factors:Former Smoker, Dyslipidemia, Hypertension and                 Diabetes. Bowel Perforation, Pulmonary Embolism.  Sonographer:  Leavy Cella RDCS (AE) Referring Phys: [id number] Royanne Foots [place]  1. Left ventricular ejection fraction, by estimation, is 60 to 65%. The left ventricle has normal function. The left ventricle has no regional wall motion abnormalities. There is moderate left ventricular hypertrophy. Left ventricular diastolic parameters are indeterminate.  2. Right ventricular systolic function is normal. The right ventricular size is normal.  3. Left atrial size was mild to moderately dilated.  4. The mitral valve is normal in structure. No evidence of mitral valve regurgitation. No evidence of mitral stenosis.  5. The aortic valve has an indeterminant number of cusps. There is moderate calcification of the aortic valve. There is moderate thickening of the aortic valve. Aortic valve regurgitation is not visualized. No aortic stenosis is present.  6. The inferior vena cava is normal in size  with greater than 50% respiratory variability, suggesting right atrial pressure of 3 mmHg. FINDINGS  Left Ventricle: Left ventricular ejection fraction, by estimation, is 60 to 65%. The left ventricle has normal function. The left ventricle has no regional wall motion abnormalities. The left ventricular internal cavity size was normal in size. There is  moderate left ventricular hypertrophy. Left ventricular diastolic parameters are indeterminate. Right Ventricle: The right ventricular size is normal. No increase in right ventricular wall thickness. Right ventricular systolic function is normal. Left Atrium: Left atrial size was mild to moderately dilated. Right Atrium: Right atrial size was normal in size. Pericardium: There is no evidence of pericardial effusion. Mitral Valve: The mitral valve is normal in structure. No evidence of mitral valve regurgitation. No evidence of mitral valve stenosis. Tricuspid Valve: The tricuspid valve is normal in structure. Tricuspid valve regurgitation is mild . No evidence of tricuspid stenosis. Aortic Valve: The aortic valve has an indeterminant number of cusps. There is moderate calcification of the aortic valve. There is moderate thickening of the aortic valve. There is moderate aortic valve annular calcification. Aortic valve regurgitation is not visualized. No aortic stenosis is present. Pulmonic Valve: The pulmonic valve was not well visualized. Pulmonic valve regurgitation is not visualized. No evidence of pulmonic stenosis. Aorta: The aortic root is normal in size and structure. Pulmonary Artery: 20. Venous: The inferior vena cava is normal in size with greater than 50% respiratory variability, suggesting right atrial pressure of 3 mmHg. IAS/Shunts: No atrial level shunt detected by color flow Doppler.  LEFT VENTRICLE PLAX 2D LVIDd:         4.27 cm  Diastology LVIDs:         2.85 cm  LV e' medial:    12.10 cm/s LV PW:         1.30 cm  LV E/e' medial:  7.0 LV IVS:         1.24 cm  LV e' lateral:   10.10 cm/s LVOT diam:     2.10 cm  LV E/e' lateral: 8.4 LV SV:         56 LV SV Index:   25 LVOT Area:     3.46 cm  RIGHT VENTRICLE RV S prime:     12.10 cm/s TAPSE (M-mode): 1.6 cm LEFT ATRIUM           Index       RIGHT ATRIUM           Index LA diam:      3.50 cm 1.56 cm/m  RA Area:     19.60 cm LA Vol (A2C): 77.1 ml 34.30 ml/m RA Volume:  58.70 ml  26.12 ml/m LA Vol (A4C): 72.5 ml 32.26 ml/m  AORTIC VALVE LVOT Vmax:   89.12 cm/s LVOT Vmean:  57.200 cm/s LVOT VTI:    0.161 m  AORTA Ao Root diam: 3.10 cm MITRAL VALVE               TRICUSPID VALVE MV Area (PHT): 3.08 cm    TR Peak grad:   19.7 mmHg MV Decel Time: 246 msec    TR Vmax:        222.00 cm/s MV E velocity: 85.30 cm/s                            SHUNTS                            Systemic VTI:  0.16 m                            Systemic Diam: 2.10 cm Carlyle Dolly MD Electronically signed by Carlyle Dolly MD Signature Date/Time: 10/20/2020/2:54:10 PM    Final     Anti-infectives: Anti-infectives (From admission, onward)   Start     Dose/Rate Route Frequency Ordered Stop   10/20/20 1530  piperacillin-tazobactam (ZOSYN) IVPB 3.375 g        3.375 g 12.5 mL/hr over 240 Minutes Intravenous Every 8 hours 10/20/20 1436     10/20/20 1515  piperacillin-tazobactam (ZOSYN) IVPB 3.375 g  Status:  Discontinued        3.375 g 100 mL/hr over 30 Minutes Intravenous  Once 10/20/20 1425 10/20/20 1426   10/18/20 2200  cefoTEtan (CEFOTAN) 2 g in sodium chloride 0.9 % 100 mL IVPB        2 g 200 mL/hr over 30 Minutes Intravenous Every 12 hours 10/18/20 1434 10/19/20 2223   10/18/20 0630  cefoTEtan (CEFOTAN) 2 g in sodium chloride 0.9 % 100 mL IVPB        2 g 200 mL/hr over 30 Minutes Intravenous On call to O.R. 10/18/20 4580 10/18/20 0820      Assessment/Plan: s/p Procedure(s): COLOSTOMY REVERSAL SMALL BOWEL RESECTION Impression: Progressing well on postoperative day 3.  Bowel function is returning.  His NG tube is  already out.  We will start full liquid diet.  May start Eliquis today.  Will advance his diet as tolerated.  LOS: 3 days    [facility] Signs 10/21/2020

## 2020-10-21 NOTE — Progress Notes (Signed)
NG tube out this am, patient refuses to let nurse insert another one stating that he has been passing gas all night. There has been zero output from the NG tube this shift.

## 2020-10-22 DIAGNOSIS — Z933 Colostomy status: Secondary | ICD-10-CM | POA: Diagnosis not present

## 2020-10-22 LAB — CBC
HCT: 32.4 % — ABNORMAL LOW (ref 39.0–52.0)
Hemoglobin: 10.2 g/dL — ABNORMAL LOW (ref 13.0–17.0)
MCH: 29 pg (ref 26.0–34.0)
MCHC: 31.5 g/dL (ref 30.0–36.0)
MCV: 92 fL (ref 80.0–100.0)
Platelets: 215 10*3/uL (ref 150–400)
RBC: 3.52 MIL/uL — ABNORMAL LOW (ref 4.22–5.81)
RDW: 16.9 % — ABNORMAL HIGH (ref 11.5–15.5)
WBC: 6.5 10*3/uL (ref 4.0–10.5)
nRBC: 0 % (ref 0.0–0.2)

## 2020-10-22 LAB — BASIC METABOLIC PANEL
Anion gap: 9 (ref 5–15)
BUN: 11 mg/dL (ref 8–23)
CO2: 24 mmol/L (ref 22–32)
Calcium: 8.3 mg/dL — ABNORMAL LOW (ref 8.9–10.3)
Chloride: 104 mmol/L (ref 98–111)
Creatinine, Ser: 0.89 mg/dL (ref 0.61–1.24)
GFR, Estimated: >60 mL/min (ref >60)
Glucose, Bld: 101 mg/dL — ABNORMAL HIGH (ref 70–99)
Potassium: 3.3 mmol/L — ABNORMAL LOW (ref 3.5–5.1)
Sodium: 137 mmol/L (ref 135–145)

## 2020-10-22 LAB — GLUCOSE, CAPILLARY
Glucose-Capillary: 120 mg/dL — ABNORMAL HIGH (ref 70–99)
Glucose-Capillary: 135 mg/dL — ABNORMAL HIGH (ref 70–99)
Glucose-Capillary: 142 mg/dL — ABNORMAL HIGH (ref 70–99)
Glucose-Capillary: 95 mg/dL (ref 70–99)

## 2020-10-22 LAB — MAGNESIUM: Magnesium: 1.7 mg/dL (ref 1.7–2.4)

## 2020-10-22 MED ORDER — POTASSIUM CHLORIDE CRYS ER 20 MEQ PO TBCR
20.0000 meq | EXTENDED_RELEASE_TABLET | Freq: Two times a day (BID) | ORAL | Status: DC
  Administered 2020-10-22 – 2020-10-23 (×3): 20 meq via ORAL
  Filled 2020-10-22 (×3): qty 1

## 2020-10-22 MED ORDER — ROSUVASTATIN CALCIUM 20 MG PO TABS
40.0000 mg | ORAL_TABLET | Freq: Every day | ORAL | Status: DC
  Administered 2020-10-22: 40 mg via ORAL
  Filled 2020-10-22: qty 2

## 2020-10-22 NOTE — TOC Progression Note (Signed)
Transition of Care [facility]) - Progression Note    Patient Details  Name: Ivann Trimarco MRN: [id number] Date of Birth: 02/15/1941  Transition of Care [facility]) CM/SW Contact  Natasha Bence, LCSW Phone Number: 10/22/2020, 5:18 PM  Clinical Narrative:    Patient declining SNF. Patient agreeable to  [facility]. CSW placed referral with [facility]. Georgina Snell with [facility] agreeable to take patient. TOC to follow   Expected Discharge Plan: [place] Barriers to Discharge: Continued Medical Work up  Expected Discharge Plan and Services Expected Discharge Plan: [place] In-house Referral: Clinical Social Work Discharge Planning Services: NA Post Acute Care Choice: [place] Living arrangements for the past 2 months: Single Family Home                 DME Arranged: N/A DME Agency: NA       HH Arranged: NA HH Agency: NA         Social Determinants of Health (SDOH) Interventions    Readmission Risk Interventions Readmission Risk Prevention Plan 06/23/2020  Transportation Screening Complete  HRI or Home Care Consult Complete  Palliative Care Screening Not Applicable  Medication Review Press photographer) Complete

## 2020-10-22 NOTE — NC FL2 (Signed)
[place] MEDICAID FL2 LEVEL OF CARE SCREENING TOOL     IDENTIFICATION  Patient Name: Vincent Cooper Birthdate: [birth year] Sex: male Admission Date (Current Location): 10/18/2020  [place] and [place] Number:  [place] [id number] [place] and Address:  [place] [address] [place]      Provider Number: [id number]  Attending Physician Name and Address:  Virl Cagey, MD  Relative Name and Phone Number:  Thamas Jaegers (daughter) Ph: [phone]    Current Level of Care: Hospital Recommended Level of Care: [place] Prior Approval Number:    Date Approved/Denied:   PASRR Number: [id number]  Discharge Plan: SNF    Current Diagnoses: Patient Active Problem List   Diagnosis Date Noted  . Preop cardiovascular exam 09/23/2020  . 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 Retraction of colostomy ([place]) 07/11/2020  . Colostomy in place [facility]) 07/11/2020  . Bowel perforation ([place]) 06/21/2020  . Free intraperitoneal air   . Cellulitis, leg 06/17/2020  . AKI (acute kidney injury) ([place]) 06/17/2020  . Coronary artery disease, non-occlusive 03/20/2020  . Hyperlipidemia with target LDL less than 70 03/20/2020  . Acute respiratory failure with hypoxia ([place])   . Bronchiectasis with acute exacerbation ([place])   . Chronic diastolic HF (heart failure) ([place])   . Chest pain with moderate risk for cardiac etiology 01/19/2020  . Shortness of breath 01/19/2020  . Morbid obesity ([place]) 01/19/2020  . COPD with acute exacerbation ([place]) 01/19/2020  . Cardiac murmur 11/07/2019  . COPD (chronic obstructive pulmonary disease) ([place]) 11/07/2019  . Diabetes mellitus type II, non insulin dependent ([place]) 11/07/2019  . OSA and COPD overlap syndrome ([place]) 11/07/2019  . Essential hypertension 11/07/2019  . Single subsegmental pulmonary embolism without acute cor  pulmonale ([place]) 11/06/2019    Orientation RESPIRATION BLADDER Height & Weight     Self, Time, Situation, Place  O2 (2L/min PRN) Continent Weight: 246 lb 4.1 oz (111.7 kg) Height:  5\' 9"  (175.3 cm)  BEHAVIORAL SYMPTOMS/MOOD NEUROLOGICAL BOWEL NUTRITION STATUS      Continent Diet (See discharge summary)  AMBULATORY STATUS COMMUNICATION OF NEEDS Skin   Extensive Assist Verbally Surgical wounds (Surgical wound from colostomy reversal)                       Personal Care Assistance Level of Assistance  Bathing, Feeding, Dressing Bathing Assistance: Maximum assistance Feeding assistance: Independent Dressing Assistance: Maximum assistance     Functional Limitations Info  Sight, Hearing, Speech Sight Info: Impaired Hearing Info: Impaired Speech Info: Adequate    SPECIAL CARE FACTORS FREQUENCY  PT (By licensed PT)     PT Frequency: 5x's/week              Contractures Contractures Info: Not present    Additional Factors Info  Code Status, Allergies, Insulin Sliding Scale Code Status Info: Full Allergies Info: NKA   Insulin Sliding Scale Info: See discharge summary       Current Medications (10/22/2020):  This is the current hospital active medication list Current Facility-Administered Medications  Medication Dose Route Frequency Provider Last Rate Last Admin  . 0.9 %  sodium chloride infusion (Manually program via Guardrails IV Fluids)   Intravenous Once Virl Cagey, MD   Stopped at 10/18/20 1636  . acetaminophen (TYLENOL) tablet 1,000 mg  1,000 mg Oral Q6H Virl Cagey, MD   1,000 mg at 10/22/20 1227  .  albuterol (VENTOLIN HFA) 108 (90 Base) MCG/ACT inhaler 1 puff  1 puff Inhalation Q6H PRN Virl Cagey, MD      . apixaban Arne [place]) tablet 2.5 mg  2.5 mg Oral BID Manuella Ghazi, Pratik D, DO   2.5 mg at 10/22/20 0846  . Chlorhexidine Gluconate Cloth 2 % PADS 6 each  6 each Topical Daily Heath Lark D, DO   6 each at 10/22/20 [phone]  . diltiazem (TIAZAC) 24  hr capsule 360 mg  360 mg Oral Daily Virl Cagey, MD   360 mg at 10/22/20 0846  . diphenhydrAMINE (BENADRYL) 12.5 MG/5ML elixir 12.5 mg  12.5 mg Oral Q6H PRN Virl Cagey, MD   12.5 mg at 10/22/20 1239   Or  . diphenhydrAMINE (BENADRYL) injection 12.5 mg  12.5 mg Intravenous Q6H PRN Virl Cagey, MD   12.5 mg at 10/21/20 2103  . fluticasone furoate-vilanterol (BREO ELLIPTA) 200-25 MCG/INH 1 puff  1 puff Inhalation Daily Virl Cagey, MD   1 puff at 10/22/20 [phone]  . insulin aspart (novoLOG) injection 0-20 Units  0-20 Units Subcutaneous Q6H Virl Cagey, MD   3 Units at 10/22/20 1227  . insulin detemir (LEVEMIR) injection 3 Units  3 Units Subcutaneous BID Heath Lark D, DO   3 Units at 10/22/20 1000  . ipratropium-albuterol (DUONEB) 0.5-2.5 (3) MG/3ML nebulizer solution 3 mL  3 mL Nebulization Q6H PRN Virl Cagey, MD      . ketorolac (TORADOL) 15 MG/ML injection 15 mg  15 mg Intravenous Q6H PRN Virl Cagey, MD      . metoCLOPramide (REGLAN) injection 10 mg  10 mg Intravenous Q6H PRN Virl Cagey, MD      . metoprolol tartrate (LOPRESSOR) injection 5 mg  5 mg Intravenous Q6H PRN Virl Cagey, MD      . metoprolol tartrate (LOPRESSOR) tablet 25 mg  25 mg Oral BID Manuella Ghazi, Pratik D, DO   25 mg at 10/22/20 0846  . morphine 2 MG/ML injection 2 mg  2 mg Intravenous Q3H PRN Virl Cagey, MD   2 mg at 10/18/20 1601  . nitroGLYCERIN (NITROSTAT) SL tablet 0.4 mg  0.4 mg Sublingual Q5 min PRN Virl Cagey, MD      . ondansetron [facility]) tablet 4 mg  4 mg Oral Q6H PRN Virl Cagey, MD       Or  . ondansetron [facility]) injection 4 mg  4 mg Intravenous Q6H PRN Virl Cagey, MD      . piperacillin-tazobactam (ZOSYN) IVPB 3.375 g  3.375 g Intravenous Q8H Virl Cagey, MD 12.5 mL/hr at 10/22/20 1524 3.375 g at 10/22/20 1524  . potassium chloride SA (KLOR-CON) CR tablet 20 mEq  20 mEq Oral BID Aviva Signs, MD   20 mEq at 10/22/20  1000  . rosuvastatin (CRESTOR) tablet 40 mg  40 mg Oral QHS Shah, Pratik D, DO      . simethicone (MYLICON) chewable tablet 40 mg  40 mg Oral Q6H PRN Virl Cagey, MD      . sodium chloride flush (NS) 0.9 % injection 10-40 mL  10-40 mL Intracatheter Q12H Virl Cagey, MD   10 mL at 10/22/20 0847  . sodium chloride flush (NS) 0.9 % injection 10-40 mL  10-40 mL Intracatheter PRN Virl Cagey, MD   10 mL at 10/19/20 1415  . tamsulosin (FLOMAX) capsule 0.4 mg  0.4 mg Oral QHS Virl Cagey, MD  0.4 mg at 10/21/20 2058  . umeclidinium bromide (INCRUSE ELLIPTA) 62.5 MCG/INH 1 puff  1 puff Inhalation Daily Virl Cagey, MD   1 puff at 10/22/20 6116     Discharge Medications: Please see discharge summary for a list of discharge medications.  Relevant Imaging Results:  Relevant Lab Results:   Additional Information SSN: [id number]  Natasha Bence, LCSW

## 2020-10-22 NOTE — Progress Notes (Signed)
4 Days Post-Op  Subjective: Patient comfortable.  Sitting in the chair.  Tolerating full liquid diet well.  Is passing flatus and had a bowel movement.  Objective: Vital signs in last 24 hours: Temp:  [98.2 F (36.8 C)] 98.2 F (36.8 C) (12/05 0432) Pulse Rate:  [69-72] 72 (12/05 0432) Resp:  [16-20] 20 (12/05 0432) BP: (115-117)/(53-68) 117/68 (12/05 0432) SpO2:  [92 %-96 %] 93 % (12/05 0828) Weight:  [111.7 kg] 111.7 kg (12/05 0500) Last BM Date: 10/21/20  Intake/Output from previous day: 12/04 0701 - 12/05 0700 In: 240 [P.O.:240] Out: 50 [Urine:50] Intake/Output this shift: No intake/output data recorded.  General appearance: alert, cooperative and no distress Resp: clear to auscultation bilaterally Cardio: regular rate and rhythm, S1, S2 normal, no murmur, click, rub or gallop GI: Soft, incisions healing well.  No drainage noted.  Bowel sounds active.  Nontender.  Lab Results:  Recent Labs    10/21/20 0511 10/22/20 0759  WBC 7.6 6.5  HGB 11.2* 10.2*  HCT 35.7* 32.4*  PLT 204 215   BMET Recent Labs    10/21/20 0511 10/22/20 0759  NA 135 137  K 3.5 3.3*  CL 103 104  CO2 22 24  GLUCOSE 99 101*  BUN 13 11  CREATININE 0.96 0.89  CALCIUM 8.4* 8.3*   PT/INR No results for input(s): LABPROT, INR in the last 72 hours.  Studies/Results: ECHOCARDIOGRAM COMPLETE  Result Date: 10/20/2020    ECHOCARDIOGRAM REPORT   Patient Name:   Vincent Cooper Date of Exam: 10/20/2020 Medical Rec #:  [id number]   Height:       69.0 in Accession #:    [id number]  Weight:       243.8 lb Date of Birth:  January 18, 1941   BSA:          2.248 m Patient Age:    79 years    BP:           131/56 mmHg Patient Gender: M           HR:           114 bpm. Exam Location:  [name] Procedure: 2D Echo Indications:    Atrial Fibrillation 427.31 / I48.91  History:        Patient has prior history of Echocardiogram examinations, most                 recent 11/07/2019. COPD, Signs/Symptoms:Chest Pain and  Murmur;                 Risk Factors:Former Smoker, Dyslipidemia, Hypertension and                 Diabetes. Bowel Perforation, Pulmonary Embolism.  Sonographer:    Leavy Cella RDCS (AE) Referring Phys: [id number] Royanne Foots [place]  1. Left ventricular ejection fraction, by estimation, is 60 to 65%. The left ventricle has normal function. The left ventricle has no regional wall motion abnormalities. There is moderate left ventricular hypertrophy. Left ventricular diastolic parameters are indeterminate.  2. Right ventricular systolic function is normal. The right ventricular size is normal.  3. Left atrial size was mild to moderately dilated.  4. The mitral valve is normal in structure. No evidence of mitral valve regurgitation. No evidence of mitral stenosis.  5. The aortic valve has an indeterminant number of cusps. There is moderate calcification of the aortic valve. There is moderate thickening of the aortic valve. Aortic valve regurgitation is not visualized. No aortic stenosis is  present.  6. The inferior vena cava is normal in size with greater than 50% respiratory variability, suggesting right atrial pressure of 3 mmHg. FINDINGS  Left Ventricle: Left ventricular ejection fraction, by estimation, is 60 to 65%. The left ventricle has normal function. The left ventricle has no regional wall motion abnormalities. The left ventricular internal cavity size was normal in size. There is  moderate left ventricular hypertrophy. Left ventricular diastolic parameters are indeterminate. Right Ventricle: The right ventricular size is normal. No increase in right ventricular wall thickness. Right ventricular systolic function is normal. Left Atrium: Left atrial size was mild to moderately dilated. Right Atrium: Right atrial size was normal in size. Pericardium: There is no evidence of pericardial effusion. Mitral Valve: The mitral valve is normal in structure. No evidence of mitral valve regurgitation. No evidence  of mitral valve stenosis. Tricuspid Valve: The tricuspid valve is normal in structure. Tricuspid valve regurgitation is mild . No evidence of tricuspid stenosis. Aortic Valve: The aortic valve has an indeterminant number of cusps. There is moderate calcification of the aortic valve. There is moderate thickening of the aortic valve. There is moderate aortic valve annular calcification. Aortic valve regurgitation is not visualized. No aortic stenosis is present. Pulmonic Valve: The pulmonic valve was not well visualized. Pulmonic valve regurgitation is not visualized. No evidence of pulmonic stenosis. Aorta: The aortic root is normal in size and structure. Pulmonary Artery: 20. Venous: The inferior vena cava is normal in size with greater than 50% respiratory variability, suggesting right atrial pressure of 3 mmHg. IAS/Shunts: No atrial level shunt detected by color flow Doppler.  LEFT VENTRICLE PLAX 2D LVIDd:         4.27 cm  Diastology LVIDs:         2.85 cm  LV e' medial:    12.10 cm/s LV PW:         1.30 cm  LV E/e' medial:  7.0 LV IVS:        1.24 cm  LV e' lateral:   10.10 cm/s LVOT diam:     2.10 cm  LV E/e' lateral: 8.4 LV SV:         56 LV SV Index:   25 LVOT Area:     3.46 cm  RIGHT VENTRICLE RV S prime:     12.10 cm/s TAPSE (M-mode): 1.6 cm LEFT ATRIUM           Index       RIGHT ATRIUM           Index LA diam:      3.50 cm 1.56 cm/m  RA Area:     19.60 cm LA Vol (A2C): 77.1 ml 34.30 ml/m RA Volume:   58.70 ml  26.12 ml/m LA Vol (A4C): 72.5 ml 32.26 ml/m  AORTIC VALVE LVOT Vmax:   89.12 cm/s LVOT Vmean:  57.200 cm/s LVOT VTI:    0.161 m  AORTA Ao Root diam: 3.10 cm MITRAL VALVE               TRICUSPID VALVE MV Area (PHT): 3.08 cm    TR Peak grad:   19.7 mmHg MV Decel Time: 246 msec    TR Vmax:        222.00 cm/s MV E velocity: 85.30 cm/s                            SHUNTS  Systemic VTI:  0.16 m                            Systemic Diam: 2.10 cm Carlyle Dolly MD  Electronically signed by Carlyle Dolly MD Signature Date/Time: 10/20/2020/2:54:10 PM    Final     Anti-infectives: Anti-infectives (From admission, onward)   Start     Dose/Rate Route Frequency Ordered Stop   10/20/20 1530  piperacillin-tazobactam (ZOSYN) IVPB 3.375 g        3.375 g 12.5 mL/hr over 240 Minutes Intravenous Every 8 hours 10/20/20 1436 10/24/20 1459   10/20/20 1515  piperacillin-tazobactam (ZOSYN) IVPB 3.375 g  Status:  Discontinued        3.375 g 100 mL/hr over 30 Minutes Intravenous  Once 10/20/20 1425 10/20/20 1426   10/18/20 2200  cefoTEtan (CEFOTAN) 2 g in sodium chloride 0.9 % 100 mL IVPB        2 g 200 mL/hr over 30 Minutes Intravenous Every 12 hours 10/18/20 1434 10/19/20 2223   10/18/20 0630  cefoTEtan (CEFOTAN) 2 g in sodium chloride 0.9 % 100 mL IVPB        2 g 200 mL/hr over 30 Minutes Intravenous On call to O.R. 10/18/20 2641 10/18/20 0820      Assessment/Plan: s/p Procedure(s): COLOSTOMY REVERSAL SMALL BOWEL RESECTION Impression: Continues to recover well, postoperative day 4.  Mild hypokalemia which will be addressed.  Will advance to heart healthy diet.  Anticipate discharge in next 24 to 48 hours.  Has been restarted on his Eliquis.  LOS: 4 days    Aviva Signs 10/22/2020

## 2020-10-22 NOTE — Progress Notes (Signed)
PROGRESS NOTE    Siddhartha Hoback  YCX:[id number] DOB: 07/04/1941 DOA: 10/18/2020 PCP: Manon Hilding, MD   Brief Narrative:  Farouk Vivero an 79 y.o.malewith prior history of sigmoid colon resection and colostomy for complicated diverticulitis who has undergone colostomy reversal with side-to-side anastomosis and small bowel resection with primary anastomosis. He has medical problems to include chronic gout,prior PE on Eliquis,COPD, CAD, type 2 diabetes-well controlled, hypertension, OSA on CPAP, BPH,andobesity. He appears to have tolerated the procedure well and is currently n.p.o except for medications.He denies any significant pain currently and is otherwise resting comfortably. We have been asked to consult for medical management during his hospitalization. He is currently n.p.o. with NG tube to low intermittent suction.  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[place])   Colostomy reversal with side-to-side anastomosis -Dietary advancement to heart healthy today  New onset afib-rate controlled; asymptomatic -Confirmed on EKG with heart rate afib in the 80 bpm range -TSH noted at 4.9,  free T4 1.03; no need for treatment at this time; monitor outpatient - 2D echocardiogram with LVEF 60-65% -Continue on metoprolol and diltiazem for heart rate control with adequate rate control noted and no symptomatology -CHA2DS2-VASc of 4 noted and patient already on Eliquis at home for prior PE to resume -Plan to follow-up outpatient with cardiology  Mild hypokalemia -Repletion ordered per primary team -Recheck a.m. labs  History of type 2 diabetes -Patient noted to be on SSI  -Recent hemoglobin A1c 4.7% on 10/16/2020 -Holding home glimepiride for now which is appropriate, can be resumed on discharge -Continue long-acting insulin as currently prescribed and resume full dose on discharge -BG well controlled  History  of hypertension/CAD -Blood pressures currently controlled -Continue metoprolol and diltiazem as ordered with good heart rate control noted -Okay to resume Crestor today -Continue to hold torsemide until blood pressures demonstrate further improvement  History ofCOPD -Patient does not wear home oxygen -Use breathing treatments as needed for shortness of breath or wheezing as ordered -No acute bronchospasms currently noted  History of BPH -Continue tamsulosin  History of PE -Eliquis resumed  Morbid obesity/OSA -Lifestyle changes outpatient -Uses CPAP at night whichhas been reordered   DVT prophylaxis: Eliquis Code Status:Full Family Communication:No family currently at bedside Disposition Plan: Status is: Inpatient  Remains inpatient appropriate because:IV treatments appropriate due to intensity of illness or inability to take PO and Inpatient level of care appropriate due to severity of illness   Dispo: The patient is from:Home Anticipated d/c is SH:FWYO Anticipated d/c date is: 1-2days per primary team Patient currently is not medically stable to d/c.  Procedures:  POD #4  Antimicrobials:  Anti-infectives (From admission, onward)   Start     Dose/Rate Route Frequency Ordered Stop   10/20/20 1530  piperacillin-tazobactam (ZOSYN) IVPB 3.375 g        3.375 g 12.5 mL/hr over 240 Minutes Intravenous Every 8 hours 10/20/20 1436 10/24/20 1459   10/20/20 1515  piperacillin-tazobactam (ZOSYN) IVPB 3.375 g  Status:  Discontinued        3.375 g 100 mL/hr over 30 Minutes Intravenous  Once 10/20/20 1425 10/20/20 1426   10/18/20 2200  cefoTEtan (CEFOTAN) 2 g in sodium chloride 0.9 % 100 mL IVPB        2 g 200 mL/hr over 30 Minutes Intravenous Every 12 hours 10/18/20 1434 10/19/20 2223   10/18/20 0630  cefoTEtan (CEFOTAN) 2 g in sodium chloride 0.9 %  100 mL IVPB        2 g 200 mL/hr over 30 Minutes Intravenous On call  to O.R. 10/18/20 6010 10/18/20 0820       Subjective: Patient seen and evaluated today with no new acute complaints or concerns. No acute concerns or events noted overnight.  He has had some bowel movements overnight and is tolerating his diet quite well.  No abdominal pain.  Objective: Vitals:   10/21/20 2318 10/22/20 0432 10/22/20 0500 10/22/20 0828  BP:  117/68    Pulse: 70 72    Resp: 16 20    Temp:  98.2 F (36.8 C)    TempSrc:      SpO2: 96% 93%  93%  Weight:   111.7 kg   Height:        Intake/Output Summary (Last 24 hours) at 10/22/2020 1040 Last data filed at 10/21/2020 1500 Gross per 24 hour  Intake 240 ml  Output 50 ml  Net 190 ml   Filed Weights   10/20/20 0400 10/21/20 0500 10/22/20 0500  Weight: 110.6 kg 111 kg 111.7 kg    Examination:  General exam: Appears calm and comfortable  Respiratory system: Clear to auscultation. Respiratory effort normal.  Currently on room air. Cardiovascular system: S1 & S2 heard, RRR. No JVD, murmurs, rubs, gallops or clicks. No pedal edema. Gastrointestinal system: Abdomen with binder present. Central nervous system: Alert and oriented. No focal neurological deficits. Extremities: No significant edema Skin: No rashes, lesions or ulcers Psychiatry: Judgement and insight appear normal. Mood & affect appropriate.     Data Reviewed: I have personally reviewed following labs and imaging studies  CBC: Recent Labs  Lab 10/16/20 0950 10/19/20 0323 10/20/20 0514 10/21/20 0511 10/22/20 0759  WBC 6.3 8.8 9.5 7.6 6.5  NEUTROABS 3.7 6.9  --  5.4  --   HGB 11.8* 11.6* 10.7* 11.2* 10.2*  HCT 38.3* 37.4* 33.6* 35.7* 32.4*  MCV 95.5 95.4 93.6 93.5 92.0  PLT 247 202 211 204 932   Basic Metabolic Panel: Recent Labs  Lab 10/16/20 0950 10/19/20 0323 10/20/20 0514 10/21/20 0511 10/22/20 0759  NA 137 135 137 135 137  K 4.0 4.9 3.5 3.5 3.3*  CL 104 104 104 103 104  CO2 26 23 24 22 24   GLUCOSE 81 129* 87 99 101*  BUN 20 17  14 13 11   CREATININE 1.12 0.99 1.02 0.96 0.89  CALCIUM 9.0 8.4* 8.5* 8.4* 8.3*  MG  --  1.7 1.7 1.7 1.7  PHOS  --  5.0*  --   --   --    GFR: Estimated Creatinine Clearance: 84.3 mL/min (by C-G formula based on SCr of 0.89 mg/dL). Liver Function Tests: Recent Labs  Lab 10/16/20 0950  AST 24  ALT 28  ALKPHOS 40  BILITOT 0.7  PROT 6.3*  ALBUMIN 3.4*   No results for input(s): LIPASE, AMYLASE in the last 168 hours. No results for input(s): AMMONIA in the last 168 hours. Coagulation Profile: No results for input(s): INR, PROTIME in the last 168 hours. Cardiac Enzymes: No results for input(s): CKTOTAL, CKMB, CKMBINDEX, TROPONINI in the last 168 hours. BNP (last 3 results) No results for input(s): PROBNP in the last 8760 hours. HbA1C: No results for input(s): HGBA1C in the last 72 hours. CBG: Recent Labs  Lab 10/21/20 0528 10/21/20 1126 10/21/20 1632 10/21/20 2328 10/22/20 0522  GLUCAP 92 89 138* 86 95   Lipid Profile: No results for input(s): CHOL, HDL, LDLCALC, TRIG, CHOLHDL,  LDLDIRECT in the last 72 hours. Thyroid Function Tests: Recent Labs    10/20/20 1535 10/21/20 1624  TSH 4.978*  --   FREET4  --  1.03   Anemia Panel: No results for input(s): VITAMINB12, FOLATE, FERRITIN, TIBC, IRON, RETICCTPCT in the last 72 hours. Sepsis Labs: No results for input(s): PROCALCITON, LATICACIDVEN in the last 168 hours.  Recent Results (from the past 240 hour(s))  SARS CORONAVIRUS 2 (TAT 6-24 HRS) Nasopharyngeal Nasopharyngeal Swab     Status: None   Collection Time: 10/16/20  9:00 AM   Specimen: Nasopharyngeal Swab  Result Value Ref Range Status   SARS Coronavirus 2 NEGATIVE NEGATIVE Final    Comment: (NOTE) SARS-CoV-2 target nucleic acids are NOT DETECTED.  The SARS-CoV-2 RNA is generally detectable in upper and lower respiratory specimens during the acute phase of infection. Negative results do not preclude SARS-CoV-2 infection, do not rule out co-infections with  other pathogens, and should not be used as the sole basis for treatment or other patient management decisions. Negative results must be combined with clinical observations, patient history, and epidemiological information. The expected result is Negative.  Fact Sheet for Patients: SugarRoll.be  Fact Sheet for Healthcare Providers: https://www.woods-mathews.com/  This test is not yet approved or cleared by the [place] FDA and  has been authorized for detection and/or diagnosis of SARS-CoV-2 by FDA under an Emergency Use Authorization (EUA). This EUA will remain  in effect (meaning this test can be used) for the duration of the COVID-19 declaration under Se ction 564(b)(1) of the Act, 21 U.S.C. section 360bbb-3(b)(1), unless the authorization is terminated or revoked sooner.  Performed at [facility], [place] [address] [place], [place] [address]   Aerobic/Anaerobic Culture (surgical/deep wound)     Status: None (Preliminary result)   Collection Time: 10/18/20  8:52 AM   Specimen: PATH Other  Result Value Ref Range Status   Specimen Description   Final    WOUND Performed at [facility], [address], [place], [place] [address]    Special Requests   Final    NONE Performed at [facility], [address], [place], [place] [address]    Gram Stain   Final    RARE WBC PRESENT, PREDOMINANTLY PMN FEW GRAM NEGATIVE RODS Performed at [facility], [place] [address], [place], [place] [address]    Culture   Final    FEW PSEUDOMONAS AERUGINOSA NO ANAEROBES ISOLATED; CULTURE IN PROGRESS FOR 5 DAYS    Report Status PENDING  Incomplete   Organism ID, Bacteria PSEUDOMONAS AERUGINOSA  Final      Susceptibility   Pseudomonas aeruginosa - MIC*    CEFTAZIDIME 4 SENSITIVE Sensitive     CIPROFLOXACIN <=0.25 SENSITIVE Sensitive     GENTAMICIN 4 SENSITIVE Sensitive     IMIPENEM 1 SENSITIVE Sensitive     PIP/TAZO 8 SENSITIVE Sensitive      CEFEPIME 0.5 SENSITIVE Sensitive     * FEW PSEUDOMONAS AERUGINOSA         Radiology Studies: ECHOCARDIOGRAM COMPLETE  Result Date: 10/20/2020    ECHOCARDIOGRAM REPORT   Patient Name:   RAMZI BRATHWAITE Date of Exam: 10/20/2020 Medical Rec #:  [id number]   Height:       69.0 in Accession #:    [id number]  Weight:       243.8 lb Date of Birth:  [birth year]   BSA:          2.248 m Patient Age:  79 years    BP:           131/56 mmHg Patient Gender: M           HR:           114 bpm. Exam Location:  [name] Procedure: 2D Echo Indications:    Atrial Fibrillation 427.31 / I48.91  History:        Patient has prior history of Echocardiogram examinations, most                 recent 11/07/2019. COPD, Signs/Symptoms:Chest Pain and Murmur;                 Risk Factors:Former Smoker, Dyslipidemia, Hypertension and                 Diabetes. Bowel Perforation, Pulmonary Embolism.  Sonographer:    Leavy Cella RDCS (AE) Referring Phys: [phone] Royanne Foots Archie  1. Left ventricular ejection fraction, by estimation, is 60 to 65%. The left ventricle has normal function. The left ventricle has no regional wall motion abnormalities. There is moderate left ventricular hypertrophy. Left ventricular diastolic parameters are indeterminate.  2. Right ventricular systolic function is normal. The right ventricular size is normal.  3. Left atrial size was mild to moderately dilated.  4. The mitral valve is normal in structure. No evidence of mitral valve regurgitation. No evidence of mitral stenosis.  5. The aortic valve has an indeterminant number of cusps. There is moderate calcification of the aortic valve. There is moderate thickening of the aortic valve. Aortic valve regurgitation is not visualized. No aortic stenosis is present.  6. The inferior vena cava is normal in size with greater than 50% respiratory variability, suggesting right atrial pressure of 3 mmHg. FINDINGS  Left Ventricle: Left ventricular ejection  fraction, by estimation, is 60 to 65%. The left ventricle has normal function. The left ventricle has no regional wall motion abnormalities. The left ventricular internal cavity size was normal in size. There is  moderate left ventricular hypertrophy. Left ventricular diastolic parameters are indeterminate. Right Ventricle: The right ventricular size is normal. No increase in right ventricular wall thickness. Right ventricular systolic function is normal. Left Atrium: Left atrial size was mild to moderately dilated. Right Atrium: Right atrial size was normal in size. Pericardium: There is no evidence of pericardial effusion. Mitral Valve: The mitral valve is normal in structure. No evidence of mitral valve regurgitation. No evidence of mitral valve stenosis. Tricuspid Valve: The tricuspid valve is normal in structure. Tricuspid valve regurgitation is mild . No evidence of tricuspid stenosis. Aortic Valve: The aortic valve has an indeterminant number of cusps. There is moderate calcification of the aortic valve. There is moderate thickening of the aortic valve. There is moderate aortic valve annular calcification. Aortic valve regurgitation is not visualized. No aortic stenosis is present. Pulmonic Valve: The pulmonic valve was not well visualized. Pulmonic valve regurgitation is not visualized. No evidence of pulmonic stenosis. Aorta: The aortic root is normal in size and structure. Pulmonary Artery: 20. Venous: The inferior vena cava is normal in size with greater than 50% respiratory variability, suggesting right atrial pressure of 3 mmHg. IAS/Shunts: No atrial level shunt detected by color flow Doppler.  LEFT VENTRICLE PLAX 2D LVIDd:         4.27 cm  Diastology LVIDs:         2.85 cm  LV e' medial:    12.10 cm/s LV PW:  1.30 cm  LV E/e' medial:  7.0 LV IVS:        1.24 cm  LV e' lateral:   10.10 cm/s LVOT diam:     2.10 cm  LV E/e' lateral: 8.4 LV SV:         56 LV SV Index:   25 LVOT Area:     3.46 cm   RIGHT VENTRICLE RV S prime:     12.10 cm/s TAPSE (M-mode): 1.6 cm LEFT ATRIUM           Index       RIGHT ATRIUM           Index LA diam:      3.50 cm 1.56 cm/m  RA Area:     19.60 cm LA Vol (A2C): 77.1 ml 34.30 ml/m RA Volume:   58.70 ml  26.12 ml/m LA Vol (A4C): 72.5 ml 32.26 ml/m  AORTIC VALVE LVOT Vmax:   89.12 cm/s LVOT Vmean:  57.200 cm/s LVOT VTI:    0.161 m  AORTA Ao Root diam: 3.10 cm MITRAL VALVE               TRICUSPID VALVE MV Area (PHT): 3.08 cm    TR Peak grad:   19.7 mmHg MV Decel Time: 246 msec    TR Vmax:        222.00 cm/s MV E velocity: 85.30 cm/s                            SHUNTS                            Systemic VTI:  0.16 m                            Systemic Diam: 2.10 cm Carlyle Dolly MD Electronically signed by Carlyle Dolly MD Signature Date/Time: 10/20/2020/2:54:10 PM    Final         Scheduled Meds: . sodium chloride   Intravenous Once  . acetaminophen  1,000 mg Oral Q6H  . apixaban  2.5 mg Oral BID  . Chlorhexidine Gluconate Cloth  6 each Topical Daily  . diltiazem  360 mg Oral Daily  . fluticasone furoate-vilanterol  1 puff Inhalation Daily  . insulin aspart  0-20 Units Subcutaneous Q6H  . insulin detemir  3 Units Subcutaneous BID  . metoprolol tartrate  25 mg Oral BID  . potassium chloride  20 mEq Oral BID  . sodium chloride flush  10-40 mL Intracatheter Q12H  . tamsulosin  0.4 mg Oral QHS  . umeclidinium bromide  1 puff Inhalation Daily   Continuous Infusions: . piperacillin-tazobactam (ZOSYN)  IV 3.375 g (10/22/20 2536)     LOS: 4 days    Time spent: 30 minutes    Pratik Darleen Crocker, DO [facility]  If 7PM-7AM, please contact night-coverage [email or web address] 10/22/2020, 10:40 AM

## 2020-10-23 ENCOUNTER — Telehealth: Payer: Self-pay | Admitting: General Surgery

## 2020-10-23 DIAGNOSIS — E876 Hypokalemia: Secondary | ICD-10-CM

## 2020-10-23 DIAGNOSIS — I4821 Permanent atrial fibrillation: Secondary | ICD-10-CM

## 2020-10-23 DIAGNOSIS — I4891 Unspecified atrial fibrillation: Secondary | ICD-10-CM

## 2020-10-23 LAB — TYPE AND SCREEN
ABO/RH(D): O POS
Antibody Screen: NEGATIVE
Unit division: 0
Unit division: 0

## 2020-10-23 LAB — MAGNESIUM: Magnesium: 1.7 mg/dL (ref 1.7–2.4)

## 2020-10-23 LAB — BPAM RBC
Blood Product Expiration Date: 202201072359
Blood Product Expiration Date: 202201082359
Unit Type and Rh: 5100
Unit Type and Rh: 5100

## 2020-10-23 LAB — AEROBIC/ANAEROBIC CULTURE W GRAM STAIN (SURGICAL/DEEP WOUND)

## 2020-10-23 LAB — BASIC METABOLIC PANEL
Anion gap: 8 (ref 5–15)
BUN: 13 mg/dL (ref 8–23)
CO2: 24 mmol/L (ref 22–32)
Calcium: 8.4 mg/dL — ABNORMAL LOW (ref 8.9–10.3)
Chloride: 106 mmol/L (ref 98–111)
Creatinine, Ser: 1.11 mg/dL (ref 0.61–1.24)
GFR, Estimated: >60 mL/min (ref >60)
Glucose, Bld: 118 mg/dL — ABNORMAL HIGH (ref 70–99)
Potassium: 3.5 mmol/L (ref 3.5–5.1)
Sodium: 138 mmol/L (ref 135–145)

## 2020-10-23 LAB — GLUCOSE, CAPILLARY
Glucose-Capillary: 113 mg/dL — ABNORMAL HIGH (ref 70–99)
Glucose-Capillary: 115 mg/dL — ABNORMAL HIGH (ref 70–99)
Glucose-Capillary: 143 mg/dL — ABNORMAL HIGH (ref 70–99)

## 2020-10-23 MED ORDER — OXYCODONE HCL 5 MG PO TABS: 5.0000 mg | ORAL_TABLET | ORAL | 0 refills | Status: DC | PRN

## 2020-10-23 MED ORDER — OXYCODONE HCL 5 MG PO TABS
5.0000 mg | ORAL_TABLET | ORAL | 0 refills | Status: DC | PRN
Start: 2020-10-23 — End: 2020-10-23

## 2020-10-23 MED ORDER — DOCUSATE SODIUM 100 MG PO CAPS: 100.0000 mg | ORAL_CAPSULE | Freq: Two times a day (BID) | ORAL | 2 refills | Status: AC | PRN

## 2020-10-23 MED ORDER — ONDANSETRON HCL 4 MG PO TABS
4.0000 mg | ORAL_TABLET | Freq: Four times a day (QID) | ORAL | 0 refills | Status: DC | PRN
Start: 2020-10-23 — End: 2021-04-17

## 2020-10-23 MED ORDER — CIPROFLOXACIN HCL 500 MG PO TABS: 500.0000 mg | ORAL_TABLET | Freq: Two times a day (BID) | ORAL | 0 refills | Status: AC

## 2020-10-23 NOTE — Discharge Summary (Signed)
Physician Discharge Summary  Patient ID: Vincent Cooper MRN: [id number] DOB/AGE: 1941-11-17 79 y.o.  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 He had his colotomy reversed and a small bowel resection due to adhesions and fat necrosis/ abscess cavities that were walled off presumably from his index perforation. He did well post operatively and had good pain control. He had an NG in for a short period with the SBR but was able to start on a diet on POD 3 and was advanced. He did have intraabdominal abscess cavities that were walled off and excised with his small bowel and colostomy but cultures did grow pseudomonas, so he was treated for this while in the hospital and sent home on Ciprofloxacin for 2 days to complete a 5 day course for the cultures.    He developed some asymptomatic atrial fibrillation post operatively likely related to fluid shifts and the hospitalist followed him while in the hospital.    On the day of discharge he was ambulating, felt good, was having bowel movements, tolerating a diet, and was back on his Eliquis for his history of PE.  He will see his cardiologist, Dr. [name] in follow up with the new onset atrial fibrillation.  The patient had planned for SNF for rehabilitation but decided he preferred home with PT. This was arranged with HH.    Consults: Hospitalist- post operative medical management  Significant Diagnostic Studies:   Results for Vincent, Cooper (MRN [id number]) as of 10/23/2020 10:58  Ref. Range 10/22/2020 07:59  WBC Latest Ref Range: 4.0 - 10.5 K/uL 6.5  RBC Latest Ref Range: 4.22 - 5.81  MIL/uL 3.52 (L)  Hemoglobin Latest Ref Range: 13.0 - 17.0 g/dL 10.2 (L)  HCT Latest Ref Range: 39 - 52 % 32.4 (L)  MCV Latest Ref Range: 80.0 - 100.0 fL 92.0  MCH Latest Ref Range: 26.0 - 34.0 pg 29.0  MCHC Latest Ref Range: 30.0 - 36.0 g/dL 31.5  RDW Latest Ref Range: 11.5 - 15.5 % 16.9 (H)  Platelets Latest Ref Range: 150 - 400 K/uL 215  nRBC Latest Ref Range: 0.0 - 0.2 % 0.0   Results for Vincent, Cooper (MRN [id number]) as of 10/23/2020 10:58  Ref. Range 10/23/2020 05:59  Sodium Latest Ref Range: 135 - 145 mmol/L 138  Potassium Latest Ref Range: 3.5 - 5.1 mmol/L 3.5  Chloride Latest Ref Range: 98 - 111 mmol/L 106  CO2 Latest Ref Range: 22 - 32 mmol/L 24  Glucose Latest Ref Range: 70 - 99 mg/dL 118 (H)  BUN Latest Ref Range: 8 - 23 mg/dL 13  Creatinine Latest Ref Range: 0.61 - 1.24 mg/dL 1.11  Calcium Latest Ref Range: 8.9 - 10.3 mg/dL 8.4 (L)  Anion gap Latest Ref Range: 5 - 15  8  Magnesium Latest Ref Range: 1.7 - 2.4 mg/dL 1.7  GFR, Estimated Latest Ref Range: >60 mL/min >60   Treatments: IV hydration, antibiotics: Zosyn and surgery: Colostomy reversal, small bowel resection 10/18/2020   Discharge Exam: Blood pressure 125/73, pulse 86, temperature (!) 97.5 F (36.4 C), temperature source Oral, resp. rate 20, height 5\' 9"  (1.753 m), weight 115 kg,  SpO2 92 %. General appearance: alert, cooperative and no distress Resp: normal work of breathing GI: soft, nontender, staples c/d/i without drainage, skin excoriation on abdominal wall, no erythema around incisions  Disposition: Discharge disposition: 01-Home or Self Care       Discharge Instructions    Call MD for:  difficulty breathing, headache or visual disturbances   Complete by: As directed    Call MD for:  extreme fatigue   Complete by: As directed    Call MD for:  persistant dizziness or light-headedness   Complete by: As directed    Call MD for:  persistant nausea and vomiting   Complete by: As directed    Call MD  for:  redness, tenderness, or signs of infection (pain, swelling, redness, odor or green/yellow discharge around incision site)   Complete by: As directed    Call MD for:  severe uncontrolled pain   Complete by: As directed    Call MD for:  temperature >100.4   Complete by: As directed    Increase activity slowly   Complete by: As directed      Allergies as of 10/23/2020      Reactions   Other    Patient reports he was allergic to something in an IV he was given but does not know what the substance was. As of 01/19/2020       Medication List    TAKE these medications   albuterol 108 (90 Base) MCG/ACT inhaler Commonly known as: VENTOLIN HFA Inhale 1 puff into the lungs every 6 (six) hours as needed for wheezing or shortness of breath.   apixaban 2.5 MG Tabs tablet Commonly known as: ELIQUIS Take 1 tablet (2.5 mg total) by mouth 2 (two) times daily.   ciprofloxacin 500 MG tablet Commonly known as: CIPRO Take 1 tablet (500 mg total) by mouth 2 (two) times daily for 2 days.   diltiazem 360 MG 24 hr capsule Commonly known as: TIAZAC Take 360 mg by mouth daily.   docusate sodium 100 MG capsule Commonly known as: Colace Take 1 capsule (100 mg total) by mouth 2 (two) times daily as needed for mild constipation.   glimepiride 2 MG tablet Commonly known as: AMARYL Take 2 mg by mouth daily with breakfast.   guaiFENesin 600 MG 12 hr tablet Commonly known as: MUCINEX Take 1 tablet (600 mg total) by mouth 2 (two) times daily.   ibuprofen 200 MG tablet Commonly known as: ADVIL Take 400 mg by mouth every 6 (six) hours as needed for moderate pain.   insulin detemir 100 UNIT/ML injection Commonly known as: LEVEMIR Inject 0.07 mLs (7 Units total) into the skin 2 (two) times daily.   ipratropium-albuterol 0.5-2.5 (3) MG/3ML Soln Commonly known as: DUONEB Take 3 mLs by nebulization every 6 (six) hours as needed (shortness of breath).   metoprolol tartrate 25 MG tablet Commonly  known as: LOPRESSOR Take 1 tablet (25 mg total) by mouth 2 (two) times daily.   nitroGLYCERIN 0.4 MG SL tablet Commonly known as: NITROSTAT Place 1 tablet (0.4 mg total) under the tongue every 5 (five) minutes as needed for chest pain.   ondansetron 4 MG tablet Commonly known as: ZOFRAN Take 1 tablet (4 mg total) by mouth every 6 (six) hours as needed for nausea.   oxyCODONE 5 MG immediate release tablet Commonly known as: Roxicodone Take 1 tablet (5 mg total) by mouth every 4 (four) hours as needed for severe pain or breakthrough pain.  potassium chloride SA 20 MEQ tablet Commonly known as: KLOR-CON Take 20 mEq by mouth daily.   rosuvastatin 40 MG tablet Commonly known as: CRESTOR Take 1 tablet (40 mg total) by mouth daily. What changed: when to take this   senna-docusate 8.6-50 MG tablet Commonly known as: Senokot-S Take 2 tablets by mouth at bedtime.   tamsulosin 0.4 MG Caps capsule Commonly known as: FLOMAX Take 1 capsule (0.4 mg total) by mouth daily. What changed: when to take this   torsemide 20 MG tablet Commonly known as: DEMADEX Take 20 mg by mouth 2 (two) times daily.   Trelegy Ellipta 200-62.5-25 MCG/INH Aepb Generic drug: Fluticasone-Umeclidin-Vilant Inhale 1 puff into the lungs daily.   triamcinolone 0.1 % Commonly known as: KENALOG Apply 1 application topically 2 (two) times daily.       Follow-up Information    [facility] Follow up.   Specialty: Home Health Services Why: HHPT Contact information: [place] [address] [phone]        Virl Cagey, MD Follow up on 10/31/2020.   Specialty: General Surgery Why: staple removal Contact information: [address] [place] [facility] [address] [phone]        Leonie Man, MD Follow up.   Specialty: Cardiology Why: New Onset A fib post op; Call to get an appt if you have not received one in 1 week Contact information: [place] [address] [phone]               Signed: Virl Cagey 10/23/2020, 11:03 AM

## 2020-10-23 NOTE — Progress Notes (Signed)
PICC line removed per order. Catheter intact upon removal, direct pressure held for 3 minutes, and bleeding controlled with sterile petroleum gauze pressure dressing. Patient advised to stay lying in bed for 30 minutes post removal and to leave pressure dressing in place for 24 hrs. Patient verbalized understanding.

## 2020-10-23 NOTE — Discharge Instructions (Signed)
Discharge Open Abdominal Surgery Instructions:  Common Complaints: Pain at the incision site is common. This will improve with time. Take your pain medications as described below. Some nausea is common and poor appetite. The main goal is to stay hydrated the first few days after surgery.   Diet/ Activity: Diet as tolerated. You have started and tolerated a diet in the hospital, and should continue to increase what you are able to eat.   You may not have a large appetite, but it is important to stay hydrated. Drink 64 ounces of water a day. Your appetite will return with time.  Keep a dry dressing in place over your staples daily or as needed. Some minor pink/ blood tinged drainage is expected. This will stop in a few days after surgery.  Shower per your regular routine daily.  Do not take hot showers. Take warm showers that are less than 10 minutes. Pat the incision dry. Wear an abdominal binder daily with activity. You do not have to wear this while sleeping or sitting.  Rest and listen to your body, but do not remain in bed all day.  Walk everyday for at least 15-20 minutes. Deep cough and move around every 1-2 hours in the first few days after surgery.  Do not lift > 10 lbs, perform excessive bending, pushing, pulling, squatting for 6-8 weeks after surgery.  The activity restrictions and the abdominal binder are to prevent hernia formation at your incision while you are healing.  Do not place lotions or balms on your incision unless instructed to specifically by Dr. Jahsir Rama.   Pain Expectations and Narcotics: -After surgery you will have pain associated with your incisions and this is normal. The pain is muscular and nerve pain, and will get better with time. -You are encouraged and expected to take non narcotic medications like tylenol and ibuprofen (when able) to treat pain as multiple modalities can aid with pain treatment. -Narcotics are only used when pain is severe or there is  breakthrough pain. -You are not expected to have a pain score of 0 after surgery, as we cannot prevent pain. A pain score of 3-4 that allows you to be functional, move, walk, and tolerate some activity is the goal. The pain will continue to improve over the days after surgery and is dependent on your surgery. -Due to [place] law, we are only able to give a certain amount of pain medication to treat post operative pain, and we only give additional narcotics on a patient by patient basis.  -For most laparoscopic surgery, studies have shown that the majority of patients only need 10-15 narcotic pills, and for open surgeries most patients only need 15-20.   -Having appropriate expectations of pain and knowledge of pain management with non narcotics is important as we do not want anyone to become addicted to narcotic pain medication.  -Using ice packs in the first 48 hours and heating pads after 48 hours, wearing an abdominal binder (when recommended), and using over the counter medications are all ways to help with pain management.   -Simple acts like meditation and mindfulness practices after surgery can also help with pain control and research has proven the benefit of these practices.  Medication: Take tylenol and ibuprofen as needed for pain control, alternating every 4-6 hours.  Example:  Tylenol 1000mg @ 6am, 12noon, 6pm, 12midnight (Do not exceed 4000mg of tylenol a day). Ibuprofen 800mg @ 9am, 3pm, 9pm, 3am (Do not exceed 3600mg of ibuprofen a day).    Take Roxicodone for breakthrough pain every 4 hours.  Take Colace for constipation related to narcotic pain medication. If you do not have a bowel movement in 2 days, take Miralax over the counter.  Drink plenty of water to also prevent constipation.   Contact Information: If you have questions or concerns, please call our office, [phone], Monday- Thursday 8AM-5PM and Friday 8AM-12Noon.  If it is after hours or on the weekend, please call [facility]  Main Number, [phone], and ask to speak to the surgeon on call for Dr. Constance Haw at [facility].     Colostomy Reversal Surgery, Care After This sheet gives you information about how to care for yourself after your procedure. Your health care provider may also give you more specific instructions. If you have problems or questions, contact your health care provider. What can I expect after the procedure? After the procedure, it is common to have:  Pain and discomfort in your abdomen, especially near your incision.  Loose stools (diarrhea).  Decreased appetite.  Constipation. Follow these instructions at home: Activity   Do not lift anything that is heavier than 10 lb (4.5 kg), or the limit that you are told, until your health care provider says that it is safe.  Return to your normal activities as told by your health care provider. Ask your health care provider what activities are safe for you.  Avoid sitting for a long time without moving. Get up to take short walks every 1-2 hours. This is important to improve blood flow and breathing. Ask for help if you feel weak or unsteady.  Avoid strenuous activity, contact sports, and abdominal exercises for 4 weeks or as long as told by your health care provider. Incision care   Follow instructions from your health care provider about how to take care of your incision. Make sure you: ? Wash your hands with soap and water before and after you change your bandage (dressing). If soap and water are not available, use hand sanitizer. ? Change your dressing as told by your health care provider. ? Leave stitches (sutures), skin glue, or adhesive strips in place. These skin closures may need to stay in place for 2 weeks or longer. If adhesive strip edges start to loosen and curl up, you may trim the loose edges. Do not remove adhesive strips completely unless your health care provider tells you to do that.  Keep the incision area clean and  dry.  Check your incision area every day for signs of infection. Check for: ? More redness, swelling, or pain. ? More fluid or blood. ? Warmth. ? Pus or a bad smell. Bathing  Do not take baths, swim, or use a hot tub until your health care provider approves. Ask your health care provider if you may take showers. You may only be allowed to take sponge baths.  If your health care provider approves bathing and showering, cover the dressing with a watertight covering to protect it from water. Do not let the dressing get wet.  Keep the dressing dry until your health care provider says it can be removed. Driving  Do not drive for 24 hours if you were given a sedative during your procedure. Follow other driving restrictions as told by your health care provider.  Do not drive or use heavy machinery while taking prescription pain medicine. Eating and drinking  Follow instructions from your health care provider about eating or drinking restrictions. This may include: ? What to eat and drink. You  may be told to start eating a bland diet. Over time, you may slowly resume a more normal, healthy diet. ? How much to eat and drink. You should eat small meals often and stop eating when you feel full.  Take nutrition supplements as told by your health care provider or dietitian. General instructions  Take over-the-counter and prescription medicines only as told by your health care provider.  Take steps to treat diarrhea or constipation as told by your health care provider. Your health care provider may recommend that you: ? Drink enough fluid to keep your urine pale yellow. Avoid fluids that contain a lot of sugar or caffeine, such as energy drinks, sports drinks, and soda. ? Eat bland, easy-to-digest foods in small amounts as you are able. These foods include bananas, applesauce, rice, lean meats, toast, and crackers. ? Take over-the-counter or prescription medicines. ? Limit foods that are high in  fat and processed sugars, such as fried or sweet foods.  Do not use any products that contain nicotine or tobacco, such as cigarettes, e-cigarettes, and chewing tobacco. These can delay incision healing after surgery. If you need help quitting, ask your health care provider.  Keep all follow-up visits as told by your health care provider. This is important. Contact a health care provider if:  You have more redness, swelling, or pain at the site of your incision.  You have more fluid or blood coming from your incision.  Your incision feels warm to the touch.  You have pus or a bad smell coming from your incision.  You have a fever.  Your incision breaks open.  You feel nauseous.  You are not able to have a bowel movement (are constipated).  Your diarrhea gets worse.  You have pain that is not controlled with medicine. Get help right away if you have:  Abdominal pain that does not go away or becomes severe.  Frequent vomiting and you are not able to eat or drink.  Difficulty breathing. Summary  After colostomy reversal surgery, it is common to have abdominal pain, decreased appetite, diarrhea, or constipation.  Follow instructions from your health care provider about how to take care of your incision. Do not let the dressing get wet.  Take over-the-counter and prescription medicines only as told by your health care provider.  Contact your health care provider if you are not able to have a bowel movement (are constipated).  Keep all follow-up visits as told by your health care provider. This is important. This information is not intended to replace advice given to you by your health care provider. Make sure you discuss any questions you have with your health care provider. Document Revised: 04/22/2018 Document Reviewed: 04/22/2018 Elsevier Patient Education  [place].

## 2020-10-23 NOTE — Telephone Encounter (Signed)
[facility]  Rx for roxicodone did not get e prescribed today for some reason. Did not sign the print one as I did not realize. Print brought to office and will discard. Will refill with E prescription to [facility].  [name], MD [facility] [address] [place], [place] [address] [phone] (office)

## 2020-10-23 NOTE — Progress Notes (Signed)
Physical Therapy Treatment Patient Details Name: Vincent Cooper MRN: [id number] DOB: July 18, 1941 Today's Date: 10/23/2020    History of Present Illness Vincent Cooper is an 79 y.o. 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  He has medical problems to include chronic gout, prior PE on Eliquis, COPD, CAD, type 2 diabetes-well controlled, hypertension, OSA on CPAP, BPH, and obesity.  He appears to have tolerated the procedure well and is currently n.p.o except for medications.  He denies any significant pain currently and is otherwise resting comfortably.  We have been asked to consult for medical management during his hospitalization.  He is currently n.p.o. with NG tube to low intermittent suction.    PT Comments    Patient demonstrates much improvement for bed mobility, transfers and ambulation in room/hallway without loss of balance.  Patient able to sit up at bedside without use of bed rails when using log rolling technique and tolerated sitting up in chair after therapy.  Patient will benefit from continued physical therapy in hospital and recommended venue below to increase strength, balance, endurance for safe ADLs and gait.    Follow Up Recommendations  Home health PT;Supervision - Intermittent     Equipment Recommendations  None recommended by PT    Recommendations for Other Services       Precautions / Restrictions Precautions Precautions: Fall Restrictions Weight Bearing Restrictions: No    Mobility  Bed Mobility Overal bed mobility: Modified Independent Bed Mobility: Supine to Sit;Rolling;Sidelying to Sit;Sit to Sidelying Rolling: Modified independent (Device/Increase time) Sidelying to sit: Modified independent (Device/Increase time)     Sit to sidelying: Modified independent (Device/Increase  time) General bed mobility comments: patient demonstrates good return for sitting up and lying down using log rolling technique  Transfers Overall transfer level: Needs assistance Equipment used: Rolling walker (2 wheeled) Transfers: Sit to/from [facility] Sit to Stand: Supervision;Modified independent (Device/Increase time) Stand pivot transfers: Supervision;Modified independent (Device/Increase time)       General transfer comment: slightly labored movement without loss of balance  Ambulation/Gait Ambulation/Gait assistance: Supervision Gait Distance (Feet): 85 Feet Assistive device: Rolling walker (2 wheeled) Gait Pattern/deviations: Decreased step length - right;Decreased step length - left;Decreased stride length Gait velocity: decreased   General Gait Details: slightly unsteady with tendency to lean on nearby objects for support when using SPC, improvement balance/safety using RW and demonstrates increased endurance/distance for ambulation without loss of balance   Stairs             Wheelchair Mobility    Modified Rankin (Stroke Patients Only)       Balance Overall balance assessment: Needs assistance Sitting-balance support: Feet supported;No upper extremity supported Sitting balance-Leahy Scale: Good Sitting balance - Comments: seated at EOB   Standing balance support: During functional activity;Single extremity supported Standing balance-Leahy Scale: Fair Standing balance comment: fair using SPC, fair/good using RW                            Cognition Arousal/Alertness: Awake/alert Behavior During Therapy: WFL for tasks assessed/performed Overall Cognitive Status: Within Functional Limits for tasks assessed                                        Exercises  General Comments        Pertinent Vitals/Pain Pain Assessment: No/denies pain    Home Living                      Prior Function             PT Goals (current goals can now be found in the care plan section) Acute Rehab PT Goals Patient Stated Goal: Return home PT Goal Formulation: With patient Time For Goal Achievement: 10/27/20 Potential to Achieve Goals: Good Progress towards PT goals: Progressing toward goals    Frequency    Min 3X/week      PT Plan Discharge plan needs to be updated    Co-evaluation              AM-PAC PT "6 Clicks" Mobility   Outcome Measure  Help needed turning from your back to your side while in a flat bed without using bedrails?: None Help needed moving from lying on your back to sitting on the side of a flat bed without using bedrails?: None Help needed moving to and from a bed to a chair (including a wheelchair)?: A Little Help needed standing up from a chair using your arms (e.g., wheelchair or bedside chair)?: A Little Help needed to walk in hospital room?: A Little Help needed climbing 3-5 steps with a railing? : A Little 6 Click Score: 20    End of Session   Activity Tolerance: Patient tolerated treatment well;Patient limited by fatigue Patient left: in chair;with call bell/phone within reach Nurse Communication: Mobility status PT Visit Diagnosis: Unsteadiness on feet (R26.81);Other abnormalities of gait and mobility (R26.89);Muscle weakness (generalized) (M62.81)     Time: 1610-9604 PT Time Calculation (min) (ACUTE ONLY): 16 min  Charges:  $Gait Training: 8-22 mins                     12:34 PM, 10/23/20 Lonell Grandchild, MPT Physical Therapist with [facility] [phone] office [phone] mobile phone

## 2020-10-23 NOTE — Progress Notes (Signed)
[facility]  Updated [name] about discharge and plan. HH to see for PT.  Will dc staples 10/31/2020. Sent Dr. [name] a message about new onset A fib.   [name], MD [facility] [address] [place], [place] [address] [phone] (office)

## 2020-10-23 NOTE — TOC Transition Note (Signed)
Transition of Care [facility]) - CM/SW Discharge Note   Patient Details  Name: Vincent Cooper MRN: [id number] Date of Birth: 21-Sep-1941  [facility] CM/SW Contact:  Boneta Lucks, RN Phone Number: 10/23/2020, 10:31 AM   Clinical Narrative:   Patient discharging home today. HHPT ordered and set up with [facility]. TOC updated Corey with discharge. Added to AVS.    Final next level of care: [place] Barriers to Discharge: Barriers Resolved   Patient Goals and CMS Choice Patient states their goals for this hospitalization and ongoing recovery are:: to go home with Home health. CMS Medicare.gov Compare Post Acute Care list provided to:: Patient Choice offered to / list presented to : Patient  Discharge Placement    Patient and family notified of of transfer: 10/23/20  Discharge Plan and Services In-house Referral: Clinical Social Work Discharge Planning Services: NA Post Acute Care Choice: [place]          DME Arranged: N/A DME Agency: NA    HH Arranged: NA HH Agency: NA  Readmission Risk Interventions Readmission Risk Prevention Plan 06/23/2020  Transportation Screening Complete  HRI or Home Care Consult Complete  Palliative Care Screening Not Applicable  Medication Review [employer]) Complete

## 2020-10-23 NOTE — Progress Notes (Signed)
Patient states understanding of discharge instructions, paperwork and prescription given

## 2020-10-24 MED ORDER — OXYCODONE HCL 5 MG PO TABS: 5.0000 mg | ORAL_TABLET | ORAL | 0 refills | Status: DC | PRN

## 2020-10-24 NOTE — Addendum Note (Signed)
Addended by: Shary Decamp B on: 10/24/2020 09:44 AM   Modules accepted: Orders

## 2020-10-24 NOTE — Telephone Encounter (Signed)
Rx defaulted to print again. I set up the RX - could you please resend.

## 2020-10-24 NOTE — Addendum Note (Signed)
Addended by: [name] on: 10/24/2020 10:10 AM   Modules accepted: Orders

## 2020-10-27 ENCOUNTER — Telehealth: Payer: Self-pay | Admitting: *Deleted

## 2020-10-27 NOTE — Telephone Encounter (Signed)
-----   Message from Arnoldo Lenis, MD sent at 10/26/2020 11:33 AM EST ----- Regarding: FW: Post Op follow up Can we get this patient a f/u with Janey Greaser BrancH MD ----- Message ----- From: Virl Cagey, MD Sent: 10/24/2020   7:19 AM EST To: Leonie Man, MD, Satira Sark, MD, # Subject: RE: Post Op follow up                          [place], sorry for confusion. He did fine and didn't need inpatient consult.   Hospitalist just wanted him to see someone soon after being discharged.  Ria Comment ----- Message ----- From: Leonie Man, MD Sent: 10/23/2020  11:13 PM EST To: Satira Sark, MD, Arnoldo Lenis, MD, # Subject: RE: Post Op follow up                          OK -- will forward to my [place].   [place] -- Mr. Wombles just had surgery - Dr. Constance Haw was asking for Cards Consult -- post-op Afib.  (I saw him pre-op -- long note).  Glenetta Hew, MD    ----- Message ----- From: Virl Cagey, MD Sent: 10/23/2020  10:02 AM EST To: Leonie Man, MD Subject: Post Op follow up                              Mr. Vi did well with the colostomy reversal. He did have some new onset A fib that was rate controlled and asymptomatic.  Hospitalist had seen in the hospital too. We just wanted to get you all to see him post op with those changes.  Ria Comment

## 2020-10-27 NOTE — Telephone Encounter (Signed)
Spoke to pt and daughter and scheduled pt for Monday @ 3pm

## 2020-10-29 NOTE — Progress Notes (Deleted)
Cardiology Office Note  Date: 10/29/2020   ID: Vincent Cooper, DOB 08/06/1941, MRN [id number]  PCP:  Vincent Hilding, MD  Cardiologist:  Vincent Hew, MD Electrophysiologist:  None   Chief Complaint: ***  History of Present Illness: Vincent Cooper is a 79 y.o. male with a history of essential hypertension, chronic diastolic heart failure, CAD, hyperlipidemia, PE, OSA, COPD, morbid obesity, DM type II.  Had some recent hospitalizations one of which was June 17, 2020 for worsening leg edema, cellulitis.  Recent weight loss .  He was noted to be hypokalemic with potassium 2.2 with creatinine 1.5. 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 Presentation to [name] ED August 30, 2020 with dyspnea, Covid positive.   Last seen by Dr. Ellyn Cooper on September 14, 2020 for pre-op clearance and was cleared for surgery. Subsequently had colostomy reversal and small bowel resection due to adhesions by Dr Vincent Cooper. He developed asymptomatic atrial fibrillation CHAD2SVASC = 4. He was to see Dr. Ellyn Cooper in follow up for new onset atrial fibrillation.   Past Medical History:  Diagnosis Date  . Chronic gout   . COPD (chronic obstructive pulmonary disease) ([employer])    Oxygen dependent  . 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 CAD-RADS 4 - Severe.  ? >50% LM stenosis -->CTFFR -- NOT PHYSIOLOGICALLY SIGNIFICANT; Large Dom RCA-<PDA/PLA.  Diffuse mild to mod plaque (25-49%) prox segment and mod (50-69%) mid-distal;  CTFFR: prox 0.95, mid 0.88, distal 0.86 (not significant); Med-sized LAD w/ long diffuse mod-severe plaque calcified plaque in prox-mid ~ 50 -69%, ? >70% -> CTFFR :   . Diabetes mellitus type II, non insulin dependent ([place])   . Hypertension   . Morbid obesity (HCC)    BMI of 38.5 with multiple risk factors.  . OSA on CPAP   . Pulmonary emboli ([place]) 10/2019    Chest CTA-4 [place] opacification of main PA but there is partially occlusive main posterior RLL and segmental/segmental branches.  Small thrombus noted in the anterior right middle lobe.  No RV strain.  Scattered aortic atherosclerosis involving great vessels.  Coronary calcification noted.  Vincent Cooper RLS (restless legs syndrome)     Past Surgical History:  Procedure Laterality Date  . BOWEL RESECTION N/A 10/18/2020   Procedure: SMALL BOWEL RESECTION;  Surgeon: Vincent Cagey, MD;  Location: [facility];  Service: General;  Laterality: N/A;  . Cataract surgery Right   . COLONOSCOPY WITH PROPOFOL N/A 09/27/2020   Procedure: COLONOSCOPY WITH PROPOFOL;  Surgeon: Vincent Houston, MD;  Location: [facility];  Service: Endoscopy;  Laterality: N/A;  1:15  . COLOSTOMY REVERSAL N/A 10/18/2020   Procedure: COLOSTOMY REVERSAL;  Surgeon: Vincent Cagey, MD;  Location: [facility];  Service: General;  Laterality: N/A;  . LAPAROTOMY N/A 06/21/2020   Procedure: EXPLORATORY LAPAROTOMY,  bowel resection, creation ostomy;  Surgeon: Vincent Cagey, MD;  Location: [facility];  Service: General;  Laterality: N/A;  . POLYPECTOMY  09/27/2020   Procedure: POLYPECTOMY;  Surgeon: Vincent Houston, MD;  Location: [facility];  Service: Endoscopy;;    Current Outpatient Medications  Medication Sig Dispense Refill  . albuterol (VENTOLIN HFA) 108 (90 Base) MCG/ACT inhaler Inhale 1 puff into the lungs every 6 (six) hours as needed for wheezing or shortness of breath.    Vincent Cooper apixaban (ELIQUIS) 2.5 MG TABS tablet Take 1 tablet (2.5 mg total) by mouth  2 (two) times daily. 60 tablet 3  . diltiazem (TIAZAC) 360 MG 24 hr capsule Take 360 mg by mouth daily.    Vincent Cooper docusate sodium (COLACE) 100 MG capsule Take 1 capsule (100 mg total) by mouth 2 (two) times daily as needed for mild constipation. 60 capsule 2  . glimepiride (AMARYL) 2 MG tablet Take 2 mg by mouth daily with breakfast.    . guaiFENesin (MUCINEX) 600 MG 12 hr tablet Take 1  tablet (600 mg total) by mouth 2 (two) times daily. 30 tablet 0  . ibuprofen (ADVIL) 200 MG tablet Take 400 mg by mouth every 6 (six) hours as needed for moderate pain.    Vincent Cooper insulin detemir (LEVEMIR) 100 UNIT/ML injection Inject 0.07 mLs (7 Units total) into the skin 2 (two) times daily. 10 mL 11  . ipratropium-albuterol (DUONEB) 0.5-2.5 (3) MG/3ML SOLN Take 3 mLs by nebulization every 6 (six) hours as needed (shortness of breath).     . metoprolol tartrate (LOPRESSOR) 25 MG tablet Take 1 tablet (25 mg total) by mouth 2 (two) times daily. 60 tablet 3  . nitroGLYCERIN (NITROSTAT) 0.4 MG SL tablet Place 1 tablet (0.4 mg total) under the tongue every 5 (five) minutes as needed for chest pain. 90 tablet 3  . ondansetron (ZOFRAN) 4 MG tablet Take 1 tablet (4 mg total) by mouth every 6 (six) hours as needed for nausea. 20 tablet 0  . oxyCODONE (ROXICODONE) 5 MG immediate release tablet Take 1 tablet (5 mg total) by mouth every 4 (four) hours as needed for severe pain or breakthrough pain. 10 tablet 0  . potassium chloride SA (KLOR-CON) 20 MEQ tablet Take 20 mEq by mouth daily.    . rosuvastatin (CRESTOR) 40 MG tablet Take 1 tablet (40 mg total) by mouth daily. (Patient taking differently: Take 40 mg by mouth at bedtime. ) 90 tablet 3  . senna-docusate (SENOKOT-S) 8.6-50 MG tablet Take 2 tablets by mouth at bedtime. 60 tablet 1  . tamsulosin (FLOMAX) 0.4 MG CAPS capsule Take 1 capsule (0.4 mg total) by mouth daily. (Patient taking differently: Take 0.4 mg by mouth at bedtime. ) 30 capsule 1  . torsemide (DEMADEX) 20 MG tablet Take 20 mg by mouth 2 (two) times daily.    Vincent Cooper ELLIPTA 200-62.5-25 MCG/INH AEPB Inhale 1 puff into the lungs daily.     Vincent Cooper triamcinolone cream (KENALOG) 0.1 % Apply 1 application topically 2 (two) times daily.      No current facility-administered medications for this visit.   Allergies:  Other   Social History: The patient  reports that he has quit smoking. He has quit using  smokeless tobacco. He reports previous alcohol use. He reports that he does not use drugs.   Family History: The patient's family history includes Breast cancer in his daughter; Colon cancer in his mother; Diabetes in his brother and brother; Diverticulitis in his sister; Emphysema in his father; Heart Problems in his maternal grandmother; Lupus in his son.   ROS:  Please see the history of present illness. Otherwise, complete review of systems is positive for {NONE DEFAULTED:18576::"none"}.  All other systems are reviewed and negative.   Physical Exam: VS:  There were no vitals taken for this visit., BMI There is no height or weight on file to calculate BMI.  Wt Readings from Last 3 Encounters:  10/23/20 253 lb 8.5 oz (115 kg)  10/16/20 240 lb (108.9 kg)  10/10/20 238 lb (108 kg)    General:  Patient appears comfortable at rest. HEENT: Conjunctiva and lids normal, oropharynx clear with moist mucosa. Neck: Supple, no elevated JVP or carotid bruits, no thyromegaly. Lungs: Clear to auscultation, nonlabored breathing at rest. Cardiac: Regular rate and rhythm, no S3 or significant systolic murmur, no pericardial rub. Abdomen: Soft, nontender, no hepatomegaly, bowel sounds present, no guarding or rebound. Extremities: No pitting edema, distal pulses 2+. Skin: Warm and dry. Musculoskeletal: No kyphosis. Neuropsychiatric: Alert and oriented x3, affect grossly appropriate.  ECG:  {EKG/Telemetry Strips Reviewed:[phone]}  Recent Labwork: 06/17/2020: B Natriuretic Peptide 87.0 10/16/2020: ALT 28; AST 24 10/20/2020: TSH 4.978 10/22/2020: Hemoglobin 10.2; Platelets 215 10/23/2020: BUN 13; Creatinine, Ser 1.11; Magnesium 1.7; Potassium 3.5; Sodium 138     Component Value Date/Time   CHOL 264 (H) 03/20/2020 1151   TRIG 151 (H) 06/22/2020 0052   HDL 99 03/20/2020 1151   CHOLHDL 2.7 03/20/2020 1151   LDLCALC 135 (H) 03/20/2020 1151    Other Studies Reviewed Today:  Echocardiogram  10/20/2020  1. Left ventricular ejection fraction, by estimation, is 60 to 65%. The left ventricle has normal function. The left ventricle has no regional wall motion abnormalities. There is moderate left ventricular hypertrophy. Left ventricular diastolic parameters are indeterminate. 2. Right ventricular systolic function is normal. The right ventricular size is normal. 3. Left atrial size was mild to moderately dilated. 4. The mitral valve is normal in structure. No evidence of mitral valve regurgitation. No evidence of mitral stenosis. 5. The aortic valve has an indeterminant number of cusps. There is moderate calcification of the aortic valve. There is moderate thickening of the aortic valve. Aortic valve regurgitation is not visualized. No aortic stenosis is present. 6. The inferior vena cava is normal in size with greater than 50% respiratory variability, suggesting right atrial pressure of 3 mmHg.  Assessment and Plan:  1. New onset atrial fibrillation ([place])   2. CAD in native artery   3. Chronic diastolic heart failure ([place])   4. Mixed hyperlipidemia    1. New onset atrial fibrillation (HCC) ***  2. CAD in native artery ***  3. Chronic diastolic heart failure (HCC) ***  4. Mixed hyperlipidemia ***  Medication Adjustments/Labs and Tests Ordered: Current medicines are reviewed at length with the patient today.  Concerns regarding medicines are outlined above.   Disposition: Follow-up with ***  Signed, Levell July, NP 10/29/2020 7:57 PM    [place] [facility] [place], [place], [place] [address] Phone: [phone]; Fax: [phone]

## 2020-10-30 ENCOUNTER — Ambulatory Visit: Payer: Medicare PPO | Admitting: Family Medicine

## 2020-10-31 ENCOUNTER — Encounter: Payer: Self-pay | Admitting: General Surgery

## 2020-10-31 ENCOUNTER — Ambulatory Visit (INDEPENDENT_AMBULATORY_CARE_PROVIDER_SITE_OTHER): Payer: Medicare PPO | Admitting: General Surgery

## 2020-10-31 ENCOUNTER — Other Ambulatory Visit: Payer: Self-pay

## 2020-10-31 VITALS — BP 113/68 | HR 86 | Temp 97.7°F | Resp 16 | Ht 69.0 in | Wt 233.0 lb

## 2020-10-31 DIAGNOSIS — Z933 Colostomy status: Secondary | ICD-10-CM

## 2020-10-31 DIAGNOSIS — K9403 Colostomy malfunction: Secondary | ICD-10-CM

## 2020-10-31 NOTE — Patient Instructions (Signed)
Steri strips will peel up in 5-7 days. Ok to remove at that time. Can start putting cream/ lotion on your skin then. No heavy lifting > 10 lbs, excessive bending, pushing, pulling, or squatting for 6-8 weeks after surgery.

## 2020-10-31 NOTE — Progress Notes (Signed)
[facility] Note   HPI:  78 y.o. Male presents to clinic for post-op follow-up evaluation after colostomy takedown. He is doing well and feeling well. He is working with PT at home as he refused SNF. Tolerating diet and regular BMs.   Review of Systems:  No fever or chills Skin excoriation improving  All other review of systems: otherwise negative   Vital Signs:  BP 113/68   Pulse 86   Temp 97.7 F (36.5 C) (Oral)   Resp 16   Ht 5\' 9"  (1.753 m)   Wt 233 lb (105.7 kg)   SpO2 95%   BMI 34.41 kg/m    Physical Exam:  Physical Exam Vitals reviewed.  Cardiovascular:     Rate and Rhythm: Normal rate.  Pulmonary:     Effort: Pulmonary effort is normal.  Abdominal:     General: There is no distension.     Palpations: Abdomen is soft.     Tenderness: There is no abdominal tenderness.     Hernia: No hernia is present.     Comments: Midline and ostomy site c/d/i with staples, staples removed and steri strips placed. No erythema or drainage, excoriated skin improving from prior ostomy wafers   Skin:    General: Skin is warm.  Neurological:     Mental Status: He is alert.  Psychiatric:        Mood and Affect: Mood normal.        Behavior: Behavior normal.      Assessment:  79 y.o. yo Male s/p colostomy reversal. Doing well. Did have A fib post op and is seeing cardiology next week to follow up.  Plan:  Steri strips will peel up in 5-7 days. Ok to remove at that time. Can start putting cream/ lotion on your skin then. No heavy lifting > 10 lbs, excessive bending, pushing, pulling, or squatting for 6-8 weeks after surgery.   Future Appointments  Date Time Provider [place]  11/09/2020  3:20 PM Leonie Man, MD [facility]  12/05/2020 11:30 AM Virl Cagey, MD [facility] None  01/30/2021  3:50 PM [facility] None  02/06/2021  4:00 PM Derek Jack, MD [facility] None     [name], MD [facility] [address] [place], [place] [address] [phone] (office)

## 2020-11-09 ENCOUNTER — Ambulatory Visit (INDEPENDENT_AMBULATORY_CARE_PROVIDER_SITE_OTHER): Payer: Medicare PPO | Admitting: Cardiology

## 2020-11-09 ENCOUNTER — Encounter: Payer: Self-pay | Admitting: Cardiology

## 2020-11-09 ENCOUNTER — Other Ambulatory Visit: Payer: Self-pay

## 2020-11-09 VITALS — BP 112/50 | HR 73 | Ht 69.0 in | Wt 239.0 lb

## 2020-11-09 DIAGNOSIS — I2699 Other pulmonary embolism without acute cor pulmonale: Secondary | ICD-10-CM | POA: Diagnosis not present

## 2020-11-09 DIAGNOSIS — I4821 Permanent atrial fibrillation: Secondary | ICD-10-CM

## 2020-11-09 DIAGNOSIS — I5032 Chronic diastolic (congestive) heart failure: Secondary | ICD-10-CM

## 2020-11-09 DIAGNOSIS — E785 Hyperlipidemia, unspecified: Secondary | ICD-10-CM | POA: Diagnosis not present

## 2020-11-09 DIAGNOSIS — G4733 Obstructive sleep apnea (adult) (pediatric): Secondary | ICD-10-CM

## 2020-11-09 DIAGNOSIS — I10 Essential (primary) hypertension: Secondary | ICD-10-CM

## 2020-11-09 DIAGNOSIS — I2510 Atherosclerotic heart disease of native coronary artery without angina pectoris: Secondary | ICD-10-CM | POA: Diagnosis not present

## 2020-11-09 DIAGNOSIS — J449 Chronic obstructive pulmonary disease, unspecified: Secondary | ICD-10-CM

## 2020-11-09 DIAGNOSIS — T45515A Adverse effect of anticoagulants, initial encounter: Secondary | ICD-10-CM

## 2020-11-09 DIAGNOSIS — Z7901 Long term (current) use of anticoagulants: Secondary | ICD-10-CM

## 2020-11-09 MED ORDER — APIXABAN 5 MG PO TABS: 5.0000 mg | ORAL_TABLET | Freq: Two times a day (BID) | ORAL | 11 refills | Status: DC

## 2020-11-09 MED ORDER — TORSEMIDE 20 MG PO TABS
20.0000 mg | ORAL_TABLET | Freq: Two times a day (BID) | ORAL | 3 refills | Status: DC
Start: 2020-11-09 — End: 2023-02-02

## 2020-11-09 NOTE — Patient Instructions (Addendum)
Medication Instructions:  Increase Eliquis 5 mg one tablet twice a day     May take an additional torsemide if you extra  swelling , may hold medication if you going to be out an about , but you may need to an extra tablet the next day.  *If you need a refill on your cardiac medications before your next appointment, please call your pharmacy*   Lab Work: Not needed   Testing/Procedures: Not needed   Follow-Up: At [facility], you and your health needs are our priority.  As part of our continuing mission to provide you with exceptional heart care, we have created designated Provider Care Teams.  These Care Teams include your primary Cardiologist (physician) and Advanced Practice Providers (APPs -  Physician Assistants and Nurse Practitioners) who all work together to provide you with the care you need, when you need it.  We recommend signing up for the patient portal called "MyChart".  Sign up information is provided on this After Visit Summary.  MyChart is used to connect with patients for Virtual Visits (Telemedicine).  Patients are able to view lab/test results, encounter notes, upcoming appointments, etc.  Non-urgent messages can be sent to your provider as well.   To learn more about what you can do with MyChart, go to [email or web address].    Your next appointment:   6 month(s)  The format for your next appointment:   In Person  Provider:   Glenetta Hew, MD   Other Instructions

## 2020-11-09 NOTE — Progress Notes (Signed)
Primary Care Provider: Manon Hilding, MD Cardiologist: Carlyle Dolly, MD Electrophysiologist: None  Clinic Note: Chief Complaint  Patient presents with  . Follow-up    Postop  . 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 male with a complicated past medical history of obesity, COPD, nonobstructive coronary disease by coronary CTA, DVT-PE, Chronic Persistent A. fib, OHS /OSA with obesity, and recent Covid pneumonia who presents today for post hospital follow-up after colostomy takedown (initially placed because of bowel perforation earlier this year).Vincent Cooper was last seen on September 06, 2020 for preop evaluation for colostomy takedown.  I had had seen him in May 2021 to discuss results of coronary CTA showing significant coronary calcium with possible left main disease.  However CT FFR was negative.  Despite having a relatively eventful course of hospitalizations with bowel perforation and emergent colectomy etc., he denied any anginal symptoms and did not have any heart failure.  Was on stable dose of torsemide for edema which is chronic.  No significant angina or heart failure.  Sedentary, using wheelchair.  Recent Hospitalizations:   09/27/20 -> preop colonoscopy  12/1-04/2020: Admitted for colostomy takedown along with associated small bowel resection due to adhesions and fat necrosis (likely related to index perforation).  Well-tolerated.  Doing well postop.  Diet advanced POD #3.  Cultures from his abscesses grew out Pseudomonas that is treated with antibiotics to complete a  5-day course of Cipro.  Asymptomatic A. fib-stable. ->  Restarted on Eliquis for DVT-A. fib once able to take p.o.  He opted to be discharged home with home health rehab as opposed to SNF rehab.  Reviewed  CV studies:    The following studies were reviewed today: (if available, images/films reviewed: From Epic Chart or Care Everywhere) . ECHO 10/20/2020: EF 60 to 65%.  Moderate LVH at least GR 1 DD.  Mild to moderate LA dilation.  Moderate aortic valve calcification-sclerosis with no stenosis.  Normal RAP.   Interval History:   Vincent Cooper returns today for postop follow-up feeling great.  He looks great.  Energy levels improved.  Very happy to be "reconnected ".  He is doing physical therapy.  He is using oxygen first physical therapy but otherwise doing well with physical therapy.  His appetite is improved, and his bowels are working well.  No fevers or chills having completed his antibiotics.  He is his baseline dyspnea.  Edema has improved.  He has no sensation of being in A. fib.  No bleeding issues.  CV Review of Symptoms (Summary): positive for - dyspnea on exertion, edema and Exercise intolerance, but notably improved. negative for - chest pain, irregular heartbeat, orthopnea, palpitations, paroxysmal nocturnal dyspnea, rapid heart rate, shortness of breath or Syncope/near syncope or TIA/amaurosis fugax, claudication  The patient does not have symptoms concerning for COVID-19 infection (fever, chills, cough, or new shortness of breath).   REVIEWED OF SYSTEMS   Review of Systems  Constitutional: Positive for malaise/fatigue (Has much better now than he did before, and still down but notably improving.). Negative for weight loss (  Is not sure if these weights are truly accurate.).  HENT: Positive for hearing loss. Negative for congestion and sinus pain.   Respiratory: Positive for shortness of breath (At baseline dyspneic, but getting better every day.).   Cardiovascular: Positive  for leg swelling (Stable, but notably decreased -> tries to wear support stockings.).  Gastrointestinal: Negative for blood in stool, constipation, diarrhea and melena.       Happy to be "reconnected "  Genitourinary: Negative for hematuria.  Musculoskeletal: Positive for back pain and joint pain. Negative for falls.  Neurological: Negative for dizziness and headaches.  Psychiatric/Behavioral: Positive for memory loss. The patient is not nervous/anxious.     I have reviewed and (if needed) personally updated the patient's problem list, medications, allergies, past medical and surgical history, social and family history.   PAST MEDICAL HISTORY   Past Medical History:  Diagnosis Date  . Chronic gout   . COPD (chronic obstructive pulmonary disease) ([place])    Oxygen dependent  . 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 CAD-RADS 4 - Severe.  ? >50% LM stenosis -->CTFFR -- NOT PHYSIOLOGICALLY SIGNIFICANT; Large Dom RCA-<PDA/PLA.  Diffuse mild to mod plaque (25-49%) prox segment and mod (50-69%) mid-distal;  CTFFR: prox 0.95, mid 0.88, distal 0.86 (not significant); Med-sized LAD w/ long diffuse mod-severe plaque calcified plaque in prox-mid ~ 50 -69%, ? >70% -> CTFFR :   . Diabetes mellitus type II, non insulin dependent ([place])   . Hypertension   . Morbid obesity (HCC)    BMI of 38.5 with multiple risk factors.  . OSA on CPAP   . Pulmonary emboli ([place]) 10/2019   Chest CTA-4 [place] opacification of main PA but there is partially occlusive main posterior RLL and segmental/segmental branches.  Small thrombus noted in the anterior right middle lobe.  No RV strain.  Scattered aortic atherosclerosis involving great vessels.  Coronary calcification noted.  Marland Kitchen RLS (restless legs syndrome)     PAST SURGICAL HISTORY   Past Surgical History:  Procedure Laterality Date  . BOWEL RESECTION N/A 10/18/2020   Procedure: SMALL BOWEL  RESECTION;  Surgeon: Virl Cagey, MD;  Location: [facility];  Service: General;  Laterality: N/A;  . Cataract surgery Right   . COLONOSCOPY WITH PROPOFOL N/A 09/27/2020   Procedure: COLONOSCOPY WITH PROPOFOL;  Surgeon: Rogene Houston, MD;  Location: [facility];  Service: Endoscopy;  Laterality: N/A;  1:15  . COLOSTOMY REVERSAL N/A 10/18/2020   Procedure: COLOSTOMY REVERSAL;  Surgeon: Virl Cagey, MD;  Location: [facility];  Service: General;  Laterality: N/A;  . LAPAROTOMY N/A 06/21/2020   Procedure: EXPLORATORY LAPAROTOMY,  bowel resection, creation ostomy;  Surgeon: Virl Cagey, MD;  Location: [facility];  Service: General;  Laterality: N/A;  . POLYPECTOMY  09/27/2020   Procedure: POLYPECTOMY;  Surgeon: Rogene Houston, MD;  Location: [facility];  Service: Endoscopy;;    Immunization History  Administered Date(s) Administered  . Influenza Inj Mdck Quad With Preservative 08/28/2018  . Influenza,inj,quad, With Preservative 08/29/2015    MEDICATIONS/ALLERGIES   Current Meds  Medication Sig  . albuterol (VENTOLIN HFA) 108 (90 Base) MCG/ACT inhaler Inhale 1 puff into the lungs every 6 (six) hours as needed for wheezing or shortness of breath.  . diltiazem (TIAZAC) 360 MG 24 hr capsule Take 360 mg by mouth daily.  Marland Kitchen docusate sodium (COLACE) 100 MG capsule Take 1 capsule (100 mg total)  by mouth 2 (two) times daily as needed for mild constipation.  Marland Kitchen glimepiride (AMARYL) 2 MG tablet Take 2 mg by mouth daily with breakfast.  . guaiFENesin (MUCINEX) 600 MG 12 hr tablet Take 1 tablet (600 mg total) by mouth 2 (two) times daily.  Marland Kitchen ibuprofen (ADVIL) 200 MG tablet Take 400 mg by mouth every 6 (six) hours as needed for moderate pain.  Marland Kitchen insulin detemir (LEVEMIR) 100 UNIT/ML injection Inject 0.07 mLs (7 Units total) into the skin 2 (two) times daily.  Marland Kitchen ipratropium-albuterol (DUONEB) 0.5-2.5 (3) MG/3ML SOLN Take 3 mLs by nebulization every 6 (six) hours as needed (shortness of breath).    . metoprolol tartrate (LOPRESSOR) 25 MG tablet Take 1 tablet (25 mg total) by mouth 2 (two) times daily.  . nitroGLYCERIN (NITROSTAT) 0.4 MG SL tablet Place 1 tablet (0.4 mg total) under the tongue every 5 (five) minutes as needed for chest pain.  Marland Kitchen ondansetron (ZOFRAN) 4 MG tablet Take 1 tablet (4 mg total) by mouth every 6 (six) hours as needed for nausea.  Marland Kitchen oxyCODONE (ROXICODONE) 5 MG immediate release tablet Take 1 tablet (5 mg total) by mouth every 4 (four) hours as needed for severe pain or breakthrough pain.  . potassium chloride SA (KLOR-CON) 20 MEQ tablet Take 20 mEq by mouth daily.  . rosuvastatin (CRESTOR) 40 MG tablet Take 1 tablet (40 mg total) by mouth daily. (Patient taking differently: Take 40 mg by mouth at bedtime.)  . senna-docusate (SENOKOT-S) 8.6-50 MG tablet Take 2 tablets by mouth at bedtime.  . tamsulosin (FLOMAX) 0.4 MG CAPS capsule Take 1 capsule (0.4 mg total) by mouth daily. (Patient taking differently: Take 0.4 mg by mouth at bedtime.)  . TRELEGY ELLIPTA 200-62.5-25 MCG/INH AEPB Inhale 1 puff into the lungs daily.   Marland Kitchen triamcinolone cream (KENALOG) 0.1 % Apply 1 application topically 2 (two) times daily.   . [DISCONTINUED] apixaban (ELIQUIS) 2.5 MG TABS tablet Take 1 tablet (2.5 mg total) by mouth 2 (two) times daily.  . [DISCONTINUED] torsemide (DEMADEX) 20 MG tablet Take 20 mg by mouth 2 (two) times daily.    Allergies  Allergen Reactions  . Other     Patient reports he was allergic to something in an IV he was given but does not know what the substance was. As of 01/19/2020     SOCIAL HISTORY/FAMILY HISTORY   Reviewed in Epic:  Pertinent findings:  Social History   Tobacco Use  . Smoking status: Former Research scientist (life sciences)  . Smokeless tobacco: Former Network engineer  . Vaping Use: Never used  Substance Use Topics  . Alcohol use: Not Currently  . Drug use: Never   Social History   Social History Narrative  . Not on file    OBJCTIVE -PE, EKG, labs   Wt  Readings from Last 3 Encounters:  11/09/20 239 lb (108.4 kg)  10/31/20 233 lb (105.7 kg)  10/23/20 253 lb 8.5 oz (115 kg)  235 lb on 09/14/20  Physical Exam: BP (!) 112/50 (BP Location: Left Arm, Patient Position: Sitting, Cuff Size: Normal)   Pulse 73   Ht 5\' 9"  (1.753 m)   Wt 239 lb (108.4 kg)   BMI 35.29 kg/m  Physical Exam Vitals reviewed.  Constitutional:      General: He is not in acute distress.    Appearance: Normal appearance. He is obese. He is not ill-appearing or toxic-appearing.     Comments: Looks remarkably well for being postop compared to his  last visit.  No longer ill-appearing; still sitting on wheelchair, says he is not using as much.  HENT:     Head: Normocephalic and atraumatic.  Neck:     Vascular: No carotid bruit, hepatojugular reflux or JVD (Unable to assess).  Cardiovascular:     Rate and Rhythm: Normal rate. Rhythm irregularly irregular.  No extrasystoles are present.    Pulses: Decreased pulses (Decreased with palpable pedal pulses. ->  Difficulty palpate because of mild edema and obesity.).     Heart sounds: S1 normal and S2 normal. Heart sounds are distant. Murmur (Harsh 1/6 C-D SEM at RUSB) heard.  No friction rub. No gallop.   Pulmonary:     Effort: Pulmonary effort is normal. No respiratory distress.     Breath sounds: Normal breath sounds.  Chest:     Chest wall: No tenderness.  Musculoskeletal:     Cervical back: Normal range of motion and neck supple.     Right lower leg: Edema (Tr-1+) present.     Left lower leg: Edema (1-2+ ) present.  Neurological:     General: No focal deficit present.     Mental Status: He is alert and oriented to person, place, and time.  Psychiatric:        Mood and Affect: Mood normal.        Behavior: Behavior normal.        Thought Content: Thought content normal.        Judgment: Judgment normal.     Adult ECG Report  Rate: 73 ;  Rhythm: atrial fibrillation and Low voltage.;  Cannot exclude septal MI, age  indeterminate.  Narrative Interpretation: Stable  Recent Labs: Reviewed Lab Results  Component Value Date   CHOL 264 (H) 03/20/2020   HDL 99 03/20/2020   LDLCALC 135 (H) 03/20/2020   TRIG 151 (H) 06/22/2020   CHOLHDL 2.7 03/20/2020   Lab Results  Component Value Date   CREATININE 1.11 10/23/2020   BUN 13 10/23/2020   NA 138 10/23/2020   K 3.5 10/23/2020   CL 106 10/23/2020   CO2 24 10/23/2020   Lab Results  Component Value Date   TSH 4.978 (H) 10/20/2020    ASSESSMENT/PLAN    Problem List Items Addressed This Visit    Permanent atrial fibrillation ([place]): CHA2DS2Vasc = 5. Eliquis (Chronic)    Rate controlled A. fib on diltiazem and beta-blocker.  Likely asymptomatic.  No signs of sick sinus syndrome/bradycardia.  Increasing apixaban back up to 5 mg twice daily.  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  He is on max dose diltiazem with low-dose metoprolol for A. fib rate control  He is not on ACE inhibitor or ARB which would make sense of being diabetic, but current combination is is for A. fib rate control.  He also is on standing dose of Demadex 40 mg daily      Relevant Medications   apixaban (ELIQUIS) 5 MG TABS tablet   torsemide (DEMADEX) 20 MG tablet   Morbid obesity (HCC) (Chronic)    No further weight loss, but seems to be stabilizing now.  Now that he  is able to get exercising I hope that he can lose some weight that way.  Discussed dietary modification.      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  Echo otherwise was pretty normal.  Dyspnea would be in a combination of hypertensive heart disease, obesity, deconditioning and OHS/OSA along with COPD.  Plan:   Continue blood pressure control with diltiazem and metoprolol.  ACE inhibitor was discontinued because of hypotension and renal failure while in hospital.  Monitor closely and restart if necessary.  Continue torsemide at current dose.  We discussed PRN dosing as well.      Relevant Medications   apixaban (ELIQUIS) 5 MG TABS tablet   torsemide (DEMADEX) 20 MG tablet   Coronary artery disease, non-occlusive (Chronic)    Extensive mixed calcified and noncalcified CAD, but nonischemic.  He tolerated significant procedures without any angina or heart failure.  Plan: He is on beta-blocker and calcium channel blocker for rate control and antianginal benefit On statin Not on aspirin because of Eliquis. He does have diabetes, could consider SGLT2 inhibitor versus GLP-1 agonist which would help for obesity would likely be a better option than glimepiride.      Relevant Medications   apixaban (ELIQUIS) 5 MG TABS tablet   torsemide (DEMADEX) 20 MG tablet   Other Relevant Orders   EKG 12-Lead (Completed)   Hyperlipidemia with target LDL less than 70 (Chronic)    Extensive CAD noted on coronary calcium score.  Extensive, but not obstructive CAD.  Despite that, would treat with a target LDL less than 70.Marland Kitchen He had labs checked in December on metoprolol: 42, HDL 52 and LDL 97.  This is notably improved from May 2021, but LDL is not reported.  My suspicion is that the LDL is also improved.  With significant provement in the in the total cholesterol, would anticipate that he is doing well on rosuvastatin and  continue current dose.      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 fib as well as for PE.  He has been on low-dose apixaban (Eliquis) which I would like to increase to 5 mg twice daily.  With him being on Eliquis, no need for aspirin.  Okay to hold to hold Eliquis 1 to 2 days preop for procedures or surgeries.  For neurologic procedures would hold 3 days.      Relevant Medications   apixaban (ELIQUIS) 5 MG TABS tablet   torsemide (DEMADEX) 20 MG tablet       COVID-19 Education: The signs and symptoms of COVID-19 were discussed with the patient and how to seek care for testing (follow up with PCP or arrange E-visit).   The importance of social distancing and COVID-19 vaccination was discussed today. The patient is practicing social distancing & Masking.   I spent a total of 53minutes with the patient spent in direct patient consultation.  Additional time spent with chart review  / charting (studies, outside notes, etc): 14 Total Time: 34 min   Current medicines are reviewed at length with the patient today.  (+/- concerns) n/a  This visit occurred during the SARS-CoV-2 public health emergency.  Safety protocols were in place, including screening questions prior to the visit, additional usage of staff PPE, and extensive cleaning of exam room while observing appropriate contact time as indicated for disinfecting solutions.  Notice: This dictation was prepared with Dragon dictation along with smaller phrase technology. Any transcriptional errors that result from this process are unintentional and may not be corrected upon review.  Patient Instructions / Medication Changes & Studies & Tests Ordered   Patient Instructions  Medication Instructions:  Increase Eliquis  5 mg one tablet twice a day     May take an additional torsemide if you extra  swelling , may hold medication if you going to be out an about , but you may need to an extra tablet the next day.  *If you need a refill on your cardiac medications before your next appointment, please call your pharmacy*   Lab Work: Not needed   Testing/Procedures: Not needed   Follow-Up: At [facility], you and your health needs are our priority.  As part of our continuing mission to provide you with exceptional heart care, we have created designated Provider Care Teams.  These Care Teams include your primary Cardiologist (physician) and Advanced Practice Providers (APPs -  Physician Assistants and Nurse Practitioners) who all work together to provide you with the care you need, when you need it.  We recommend signing up for the patient portal called "MyChart".  Sign up information is provided on this After Visit Summary.  MyChart is used to connect with patients for Virtual Visits (Telemedicine).  Patients are able to view lab/test results, encounter notes, upcoming appointments, etc.  Non-urgent messages can be sent to your provider as well.   To learn more about what you can do with MyChart, go to [email or web address].    Your next appointment:   6 month(s)  The format for your next appointment:   In Person  Provider:   Glenetta Hew, MD   Other Instructions     Studies Ordered:   Orders Placed This Encounter  Procedures  . EKG 12-Lead     Glenetta Hew, M.D., M.S. Interventional Cardiologist   Pager # [phone] Phone # [phone] [address] [place], [place] [address]   Thank you for choosing [facility] at [facility]!!

## 2020-11-19 ENCOUNTER — Encounter: Payer: Self-pay | Admitting: Cardiology

## 2020-11-19 NOTE — Assessment & Plan Note (Addendum)
Pretty much will be on long-term anticoagulation for his A. fib as well as for PE.  He has been on low-dose apixaban (Eliquis) which I would like to increase to 5 mg twice daily.  With him being on Eliquis, no need for aspirin.  Okay to hold to hold Eliquis 1 to 2 days preop for procedures or surgeries.  For neurologic procedures would hold 3 days.

## 2020-11-19 NOTE — Assessment & Plan Note (Signed)
Extensive mixed calcified and noncalcified CAD, but nonischemic.  He tolerated significant procedures without any angina or heart failure.  Plan: He is on beta-blocker and calcium channel blocker for rate control and antianginal benefit On statin Not on aspirin because of Eliquis. He does have diabetes, could consider SGLT2 inhibitor versus GLP-1 agonist which would help for obesity would likely be a better option than glimepiride.

## 2020-11-19 NOTE — Assessment & Plan Note (Signed)
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  Echo otherwise was pretty normal.  Dyspnea would be in a combination of hypertensive heart disease, obesity, deconditioning and OHS/OSA along with COPD.  Plan:   Continue blood pressure control with diltiazem and metoprolol.  ACE inhibitor was discontinued because of hypotension and renal failure while in hospital.  Monitor closely and restart if necessary.  Continue torsemide at current dose.  We discussed PRN dosing as well.

## 2020-11-19 NOTE — Assessment & Plan Note (Signed)
Blood pressure is quite well controlled today on current meds.  He is on max dose diltiazem with low-dose metoprolol for A. fib rate control  He is not on ACE inhibitor or ARB which would make sense of being diabetic, but current combination is is for A. fib rate control.  He also is on standing dose of Demadex 40 mg daily

## 2020-11-19 NOTE — Assessment & Plan Note (Signed)
Extensive CAD noted on coronary calcium score.  Extensive, but not obstructive CAD.  Despite that, would treat with a target LDL less than 70.Marland Kitchen He had labs checked in December on metoprolol: 42, HDL 52 and LDL 97.  This is notably improved from May 2021, but LDL is not reported.  My suspicion is that the LDL is also improved.  With significant provement in the in the total cholesterol, would anticipate that he is doing well on rosuvastatin and continue current dose.

## 2020-11-19 NOTE — Assessment & Plan Note (Signed)
CPAP.  

## 2020-11-19 NOTE — Assessment & Plan Note (Signed)
No further weight loss, but seems to be stabilizing now.  Now that he is able to get exercising I hope that he can lose some weight that way.  Discussed dietary modification.

## 2020-11-20 NOTE — Assessment & Plan Note (Signed)
Rate controlled A. fib on diltiazem and beta-blocker.  Likely asymptomatic.  No signs of sick sinus syndrome/bradycardia.  Increasing apixaban back up to 5 mg twice daily.  This patients CHA2DS2-VASc Score and unadjusted Ischemic Stroke Rate (% per year) is equal to 7.2 % stroke rate/year from a score of 5  Above score calculated as 1 point each if present [CHF, HTN, DM, Vascular=MI/PAD/Aortic Plaque, Age if 65-74, or Male] Above score calculated as 2 points each if present [Age > 75, or Stroke/TIA/TE]

## 2020-11-21 DIAGNOSIS — G4733 Obstructive sleep apnea (adult) (pediatric): Secondary | ICD-10-CM | POA: Diagnosis not present

## 2020-11-27 DIAGNOSIS — J449 Chronic obstructive pulmonary disease, unspecified: Secondary | ICD-10-CM | POA: Diagnosis not present

## 2020-12-05 ENCOUNTER — Ambulatory Visit: Payer: Medicare PPO | Admitting: General Surgery

## 2020-12-14 ENCOUNTER — Other Ambulatory Visit: Payer: Self-pay

## 2020-12-14 ENCOUNTER — Encounter: Payer: Self-pay | Admitting: General Surgery

## 2020-12-14 ENCOUNTER — Ambulatory Visit (INDEPENDENT_AMBULATORY_CARE_PROVIDER_SITE_OTHER): Payer: Medicare HMO | Admitting: General Surgery

## 2020-12-14 VITALS — BP 115/61 | HR 70 | Temp 97.5°F | Resp 16 | Ht 69.0 in | Wt 240.0 lb

## 2020-12-14 DIAGNOSIS — Z933 Colostomy status: Secondary | ICD-10-CM

## 2020-12-14 DIAGNOSIS — Z23 Encounter for immunization: Secondary | ICD-10-CM | POA: Diagnosis not present

## 2020-12-14 NOTE — Patient Instructions (Signed)
Activity and diet as tolerated.    

## 2020-12-14 NOTE — Progress Notes (Signed)
[facility] Note   HPI:  80 y.o. Male presents to clinic for follow-up evaluation after colostomy reversal. He is doing great and eating and having bowel function. He is feeling good.  Review of Systems:  No pain or issues All other review of systems: otherwise negative   Vital Signs:  BP 115/61   Pulse 70   Temp (!) 97.5 F (36.4 C) (Other (Comment))   Resp 16   Ht 5\' 9"  (1.753 m)   Wt 240 lb (108.9 kg)   SpO2 97%   BMI 35.44 kg/m    Physical Exam:  Physical Exam Vitals reviewed.  Cardiovascular:     Rate and Rhythm: Normal rate.  Pulmonary:     Effort: Pulmonary effort is normal.  Abdominal:     General: There is no distension.     Palpations: Abdomen is soft.     Tenderness: There is no abdominal tenderness.     Hernia: No hernia is present.     Comments: Midline incisions healing  Neurological:     Mental Status: He is alert.     Assessment:  80 y.o. yo Male s/p colostomy reversal. Doing great. He is over 6 weeks out.   Plan:  Activity and diet as tolerated.   Future Appointments  Date Time Provider [place]  01/30/2021  3:50 PM [facility] None  02/06/2021  4:00 PM Derek Jack, MD [facility] None  04/17/2021  1:20 PM Leonie Man, MD [facility] [place], MD [facility] [address] [place], [place] [address] [phone] (office)

## 2020-12-22 DIAGNOSIS — G4733 Obstructive sleep apnea (adult) (pediatric): Secondary | ICD-10-CM | POA: Diagnosis not present

## 2020-12-28 DIAGNOSIS — J449 Chronic obstructive pulmonary disease, unspecified: Secondary | ICD-10-CM | POA: Diagnosis not present

## 2021-01-03 DIAGNOSIS — E1122 Type 2 diabetes mellitus with diabetic chronic kidney disease: Secondary | ICD-10-CM | POA: Diagnosis not present

## 2021-01-03 DIAGNOSIS — E7849 Other hyperlipidemia: Secondary | ICD-10-CM | POA: Diagnosis not present

## 2021-01-03 DIAGNOSIS — I10 Essential (primary) hypertension: Secondary | ICD-10-CM | POA: Diagnosis not present

## 2021-01-03 DIAGNOSIS — R609 Edema, unspecified: Secondary | ICD-10-CM | POA: Diagnosis not present

## 2021-01-11 IMAGING — DX DG CHEST 1V PORT
1 series · 1 of 1 positions shown · non-contrast
Comparison: None

CLINICAL DATA: Shortness of breath.

EXAM:
PORTABLE CHEST 1 VIEW

[chest ap]
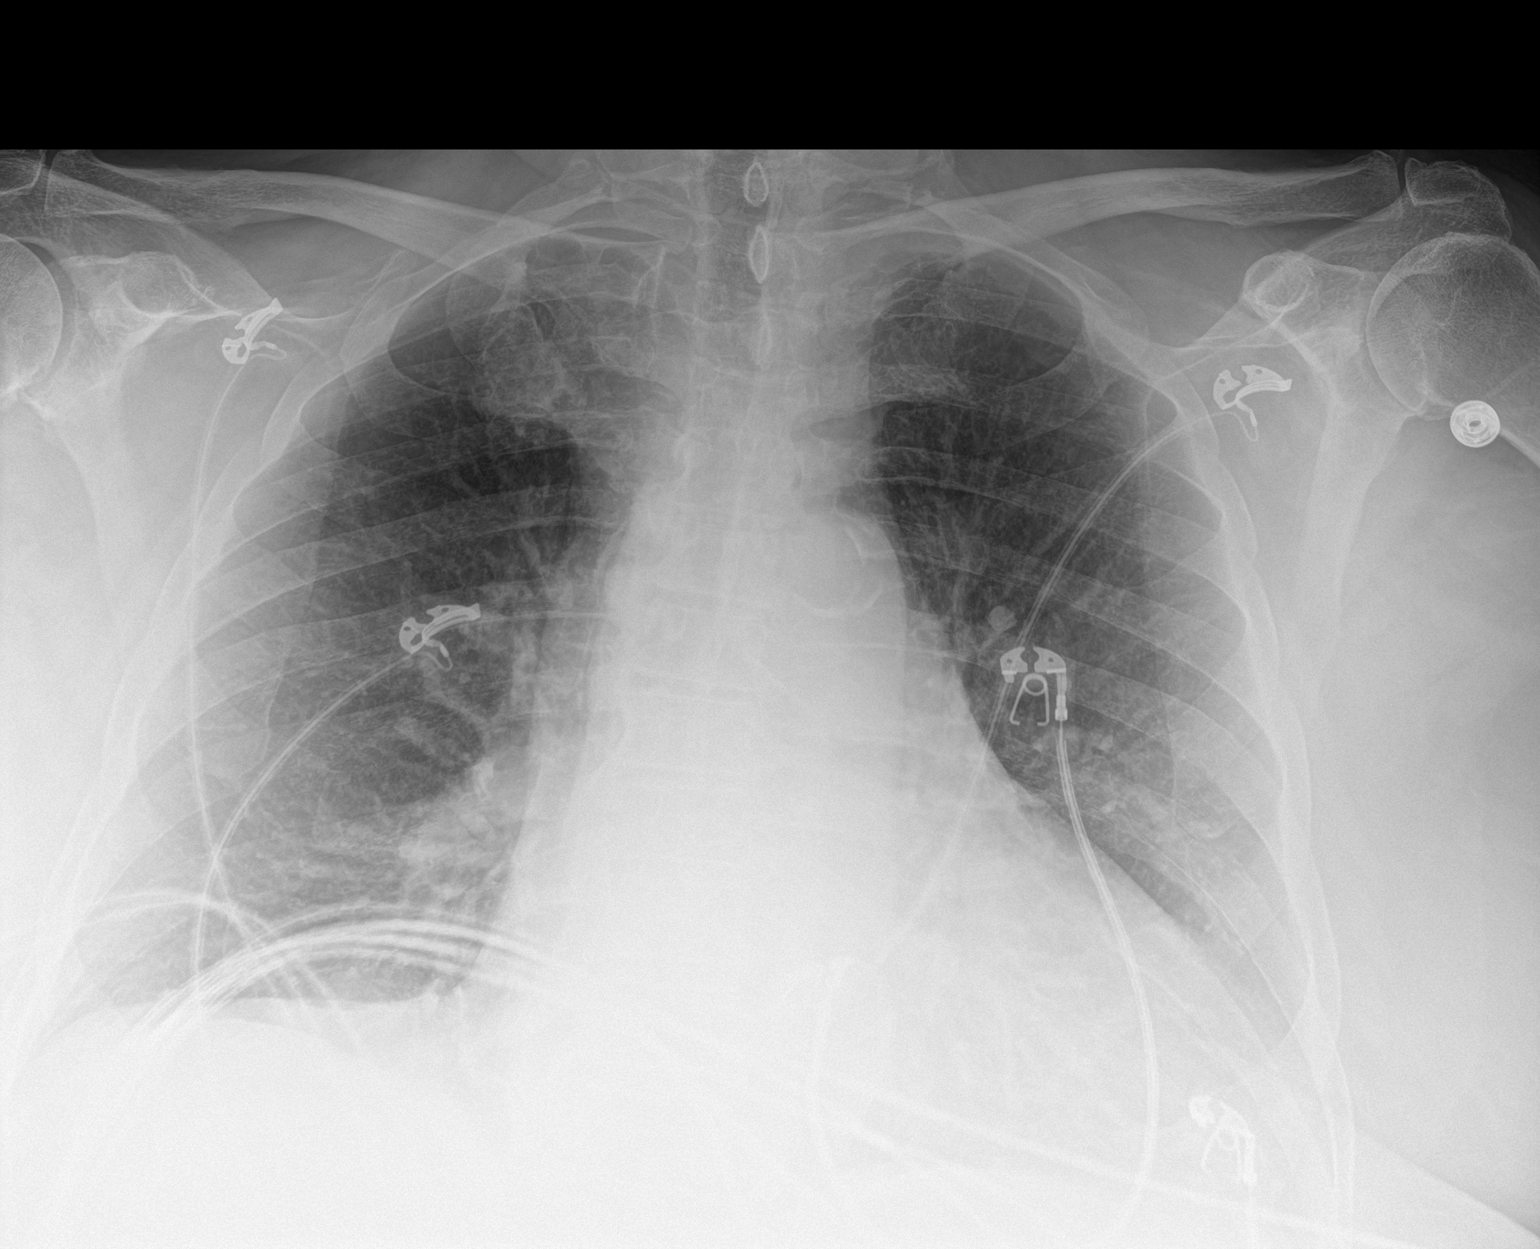

[1 of 1 positions shown; findings below may reference images not displayed]

FINDINGS: Cardiomediastinal contours are mildly enlarged. Low lung volumes
without signs of consolidation or pleural effusion. No signs of
acute bone finding.
IMPRESSION: Mild cardiac enlargement with low volumes and without acute
cardiopulmonary process. The

## 2021-01-19 DIAGNOSIS — G4733 Obstructive sleep apnea (adult) (pediatric): Secondary | ICD-10-CM | POA: Diagnosis not present

## 2021-01-25 DIAGNOSIS — J449 Chronic obstructive pulmonary disease, unspecified: Secondary | ICD-10-CM | POA: Diagnosis not present

## 2021-01-30 ENCOUNTER — Other Ambulatory Visit: Payer: Self-pay

## 2021-01-30 ENCOUNTER — Inpatient Hospital Stay (HOSPITAL_COMMUNITY): Payer: Medicare HMO | Attending: Hematology

## 2021-01-30 DIAGNOSIS — Z9981 Dependence on supplemental oxygen: Secondary | ICD-10-CM | POA: Insufficient documentation

## 2021-01-30 DIAGNOSIS — Z6838 Body mass index (BMI) 38.0-38.9, adult: Secondary | ICD-10-CM | POA: Insufficient documentation

## 2021-01-30 DIAGNOSIS — Z86711 Personal history of pulmonary embolism: Secondary | ICD-10-CM | POA: Diagnosis not present

## 2021-01-30 DIAGNOSIS — E119 Type 2 diabetes mellitus without complications: Secondary | ICD-10-CM | POA: Diagnosis not present

## 2021-01-30 DIAGNOSIS — Z7901 Long term (current) use of anticoagulants: Secondary | ICD-10-CM | POA: Diagnosis not present

## 2021-01-30 DIAGNOSIS — J449 Chronic obstructive pulmonary disease, unspecified: Secondary | ICD-10-CM | POA: Insufficient documentation

## 2021-01-30 DIAGNOSIS — Z794 Long term (current) use of insulin: Secondary | ICD-10-CM | POA: Insufficient documentation

## 2021-01-30 DIAGNOSIS — Z86718 Personal history of other venous thrombosis and embolism: Secondary | ICD-10-CM | POA: Diagnosis not present

## 2021-01-30 DIAGNOSIS — Z87891 Personal history of nicotine dependence: Secondary | ICD-10-CM | POA: Diagnosis not present

## 2021-01-30 DIAGNOSIS — Z79899 Other long term (current) drug therapy: Secondary | ICD-10-CM | POA: Insufficient documentation

## 2021-01-30 DIAGNOSIS — I2699 Other pulmonary embolism without acute cor pulmonale: Secondary | ICD-10-CM

## 2021-01-30 DIAGNOSIS — T45515A Adverse effect of anticoagulants, initial encounter: Secondary | ICD-10-CM

## 2021-01-30 LAB — D-DIMER, QUANTITATIVE: D-Dimer, Quant: 0.8 ug/mL-FEU — ABNORMAL HIGH (ref 0.00–0.50)

## 2021-02-06 ENCOUNTER — Inpatient Hospital Stay (HOSPITAL_BASED_OUTPATIENT_CLINIC_OR_DEPARTMENT_OTHER): Payer: Medicare HMO | Admitting: Hematology

## 2021-02-06 ENCOUNTER — Other Ambulatory Visit: Payer: Self-pay

## 2021-02-06 VITALS — BP 118/52 | HR 60 | Temp 97.2°F | Resp 18 | Wt 250.1 lb

## 2021-02-06 DIAGNOSIS — Z7901 Long term (current) use of anticoagulants: Secondary | ICD-10-CM | POA: Diagnosis not present

## 2021-02-06 DIAGNOSIS — Z6838 Body mass index (BMI) 38.0-38.9, adult: Secondary | ICD-10-CM | POA: Diagnosis not present

## 2021-02-06 DIAGNOSIS — Z794 Long term (current) use of insulin: Secondary | ICD-10-CM | POA: Diagnosis not present

## 2021-02-06 DIAGNOSIS — T45515A Adverse effect of anticoagulants, initial encounter: Secondary | ICD-10-CM

## 2021-02-06 DIAGNOSIS — I2699 Other pulmonary embolism without acute cor pulmonale: Secondary | ICD-10-CM | POA: Diagnosis not present

## 2021-02-06 DIAGNOSIS — Z9981 Dependence on supplemental oxygen: Secondary | ICD-10-CM | POA: Diagnosis not present

## 2021-02-06 DIAGNOSIS — Z86711 Personal history of pulmonary embolism: Secondary | ICD-10-CM | POA: Diagnosis not present

## 2021-02-06 DIAGNOSIS — J449 Chronic obstructive pulmonary disease, unspecified: Secondary | ICD-10-CM | POA: Diagnosis not present

## 2021-02-06 DIAGNOSIS — Z86718 Personal history of other venous thrombosis and embolism: Secondary | ICD-10-CM | POA: Diagnosis not present

## 2021-02-06 DIAGNOSIS — E119 Type 2 diabetes mellitus without complications: Secondary | ICD-10-CM | POA: Diagnosis not present

## 2021-02-06 NOTE — Progress Notes (Signed)
Vincent Cooper, [place] [address]   CLINIC:  Medical Oncology/Hematology  PCP:  Manon Hilding, MD [address] [place] [address]  [phone]  REASON FOR VISIT:  Follow-up for history of DVT  PRIOR THERAPY: None  CURRENT THERAPY: Eliquis 5 mg BID  INTERVAL HISTORY:  Vincent Cooper, a 80 y.o. 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 Vincent Cooper was last seen on 08/02/2020.  Today he reports feeling well. He continues taking Eliquis BID and is tolerating it well; he denies having melena, hematochezia or hematuria. His Eliquis was increased from 2.5 mg to 5 mg by Dr. Luna Glasgow on 11/09/2020. He had his colostomy reversal on 12/01 with Dr. Constance Haw.   REVIEW OF SYSTEMS:  Review of Systems  Constitutional: Positive for fatigue (75%). Negative for appetite change.  Gastrointestinal: Negative for blood in stool.  Genitourinary: Negative for hematuria.   All other systems reviewed and are negative.   PAST MEDICAL/SURGICAL HISTORY:  Past Medical History:  Diagnosis Date  . Chronic gout   . COPD (chronic obstructive pulmonary disease) ([place])    Oxygen dependent  . 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 CAD-RADS 4 - Severe.  ? >50% LM stenosis -->CTFFR -- NOT PHYSIOLOGICALLY SIGNIFICANT; Large Dom RCA-<PDA/PLA.  Diffuse mild to mod plaque (25-49%) prox segment and mod (50-69%) mid-distal;  CTFFR: prox 0.95, mid 0.88, distal 0.86 (not significant); Med-sized LAD w/ long diffuse mod-severe plaque calcified plaque in prox-mid ~ 50 -69%, ? >70% -> CTFFR :   . Diabetes mellitus type II, non insulin dependent ([place])   . Hypertension   . Morbid obesity ([facility])    BMI of 38.5 with multiple risk factors.  . OSA on CPAP   . Pulmonary emboli ([place]) 10/2019   Chest CTA-4 [place] opacification of main PA but there is partially occlusive main posterior RLL and  segmental/segmental branches.  Small thrombus noted in the anterior right middle lobe.  No RV strain.  Scattered aortic atherosclerosis involving great vessels.  Coronary calcification noted.  Marland Kitchen RLS (restless legs syndrome)    Past Surgical History:  Procedure Laterality Date  . BOWEL RESECTION N/A 10/18/2020   Procedure: SMALL BOWEL RESECTION;  Surgeon: Virl Cagey, MD;  Location: [facility];  Service: General;  Laterality: N/A;  . Cataract surgery Right   . COLONOSCOPY WITH PROPOFOL N/A 09/27/2020   Procedure: COLONOSCOPY WITH PROPOFOL;  Surgeon: Rogene Houston, MD;  Location: [facility];  Service: Endoscopy;  Laterality: N/A;  1:15  . COLOSTOMY REVERSAL N/A 10/18/2020   Procedure: COLOSTOMY REVERSAL;  Surgeon: Virl Cagey, MD;  Location: [facility];  Service: General;  Laterality: N/A;  . LAPAROTOMY N/A 06/21/2020   Procedure: EXPLORATORY LAPAROTOMY,  bowel resection, creation ostomy;  Surgeon: Virl Cagey, MD;  Location: [facility];  Service: General;  Laterality: N/A;  . POLYPECTOMY  09/27/2020   Procedure: POLYPECTOMY;  Surgeon: Rogene Houston, MD;  Location: [facility];  Service: Endoscopy;;    SOCIAL HISTORY:  Social History   Socioeconomic History  . Marital status: Legally Separated    Spouse name: Not on file  . Number of children: 3  . Years of education: Not on file  . Highest education level: Not on file  Occupational History  . Occupation: retired  Tobacco Use  . Smoking status: Former Research scientist (life sciences)  . Smokeless tobacco:  Former Network engineer  . Vaping Use: Never used  Substance and Sexual Activity  . Alcohol use: Not Currently  . Drug use: Never  . Sexual activity: Not on file  Other Topics Concern  . Not on file  Social History Narrative  . Not on file   Social Determinants of Health   Financial Resource Strain: Not on file  Food Insecurity: Not on file  Transportation Needs: Not on file  Physical Activity: Not on file  Stress: Not on file   Social Connections: Not on file  Intimate Partner Violence: Not on file    FAMILY HISTORY:  Family History  Problem Relation Age of Onset  . Colon cancer Mother   . Emphysema Father   . Diverticulitis Sister   . Diabetes Brother   . Heart Problems Maternal Grandmother   . Diabetes Brother   . Lupus Son   . Breast cancer Daughter   . CAD Neg Hx     CURRENT MEDICATIONS:  Current Outpatient Medications  Medication Sig Dispense Refill  . albuterol (VENTOLIN HFA) 108 (90 Base) MCG/ACT inhaler Inhale 1 puff into the lungs every 6 (six) hours as needed for wheezing or shortness of breath.    . betamethasone dipropionate 0.05 % cream Apply topically 2 (two) times daily.    . Blood Glucose Monitoring Suppl (ACCU-CHEK GUIDE) w/Device KIT USE TO TEST DAILY ASEDIRECTD.R    . diltiazem (CARDIZEM CD) 180 MG 24 hr capsule Take 360 mg by mouth daily.    Marland Kitchen diltiazem (CARDIZEM CD) 360 MG 24 hr capsule Take 360 mg by mouth daily.    Marland Kitchen diltiazem (TIAZAC) 360 MG 24 hr capsule Take 360 mg by mouth daily.    Marland Kitchen docusate sodium (COLACE) 100 MG capsule Take 1 capsule (100 mg total) by mouth 2 (two) times daily as needed for mild constipation. 60 capsule 2  . doxycycline (VIBRA-TABS) 100 MG tablet Take 100 mg by mouth 2 (two) times daily.    Marland Kitchen glimepiride (AMARYL) 2 MG tablet Take 2 mg by mouth daily with breakfast.    . guaiFENesin (MUCINEX) 600 MG 12 hr tablet Take 1 tablet (600 mg total) by mouth 2 (two) times daily. 30 tablet 0  . ibuprofen (ADVIL) 200 MG tablet Take 400 mg by mouth every 6 (six) hours as needed for moderate pain.    Marland Kitchen insulin detemir (LEVEMIR) 100 UNIT/ML injection Inject 0.07 mLs (7 Units total) into the skin 2 (two) times daily. 10 mL 11  . ipratropium-albuterol (DUONEB) 0.5-2.5 (3) MG/3ML SOLN Take 3 mLs by nebulization every 6 (six) hours as needed (shortness of breath).     . metoprolol tartrate (LOPRESSOR) 25 MG tablet Take 1 tablet (25 mg total) by mouth 2 (two) times daily. 60  tablet 3  . nitroGLYCERIN (NITROSTAT) 0.4 MG SL tablet Place 1 tablet (0.4 mg total) under the tongue every 5 (five) minutes as needed for chest pain. 90 tablet 3  . ondansetron (ZOFRAN) 4 MG tablet Take 1 tablet (4 mg total) by mouth every 6 (six) hours as needed for nausea. 20 tablet 0  . oxyCODONE (ROXICODONE) 5 MG immediate release tablet Take 1 tablet (5 mg total) by mouth every 4 (four) hours as needed for severe pain or breakthrough pain. 10 tablet 0  . potassium chloride SA (KLOR-CON) 20 MEQ tablet Take 20 mEq by mouth daily.    . rosuvastatin (CRESTOR) 40 MG tablet Take 1 tablet (40 mg total) by mouth daily. (Patient  taking differently: Take 40 mg by mouth at bedtime.) 90 tablet 3  . senna-docusate (SENOKOT-S) 8.6-50 MG tablet Take 2 tablets by mouth at bedtime. 60 tablet 1  . tamsulosin (FLOMAX) 0.4 MG CAPS capsule Take 1 capsule (0.4 mg total) by mouth daily. (Patient taking differently: Take 0.4 mg by mouth at bedtime.) 30 capsule 1  . torsemide (DEMADEX) 20 MG tablet Take 1 tablet (20 mg total) by mouth 2 (two) times daily. May take an extra 20 mg if needed for increase swelling 200 tablet 3  . TRELEGY ELLIPTA 200-62.5-25 MCG/INH AEPB Inhale 1 puff into the lungs daily.     Marland Kitchen triamcinolone cream (KENALOG) 0.1 % Apply 1 application topically 2 (two) times daily.     Marland Kitchen apixaban (ELIQUIS) 5 MG TABS tablet Take 1 tablet (5 mg total) by mouth 2 (two) times daily. 60 tablet 11   No current facility-administered medications for this visit.    ALLERGIES:  Allergies  Allergen Reactions  . Other     Patient reports he was allergic to something in an IV he was given but does not know what the substance was. As of 01/19/2020     PHYSICAL EXAM:  Performance status (ECOG): 2 - Symptomatic, <50% confined to bed  Vitals:   02/06/21 1554  BP: (!) 118/52  Pulse: 60  Resp: 18  Temp: (!) 97.2 F (36.2 C)  SpO2: 97%   Wt Readings from Last 3 Encounters:  02/06/21 250 lb 1.6 oz (113.4 kg)   12/14/20 240 lb (108.9 kg)  11/09/20 239 lb (108.4 kg)   Physical Exam Vitals reviewed.  Constitutional:      Appearance: Normal appearance. He is obese.  Cardiovascular:     Rate and Rhythm: Normal rate and regular rhythm.     Pulses: Normal pulses.     Heart sounds: Normal heart sounds.  Pulmonary:     Effort: Pulmonary effort is normal.     Breath sounds: Normal breath sounds.  Musculoskeletal:     Right lower leg: No edema.     Left lower leg: No edema.  Neurological:     General: No focal deficit present.     Mental Status: He is alert and oriented to person, place, and time.  Psychiatric:        Mood and Affect: Mood normal.        Behavior: Behavior normal.     LABORATORY DATA:  I have reviewed the labs as listed.  CBC Latest Ref Rng & Units 10/22/2020 10/21/2020 10/20/2020  WBC 4.0 - 10.5 K/uL 6.5 7.6 9.5  Hemoglobin 13.0 - 17.0 g/dL 10.2(L) 11.2(L) 10.7(L)  Hematocrit 39.0 - 52.0 % 32.4(L) 35.7(L) 33.6(L)  Platelets 150 - 400 K/uL 215 204 211   CMP Latest Ref Rng & Units 10/23/2020 10/22/2020 10/21/2020  Glucose 70 - 99 mg/dL 118(H) 101(H) 99  BUN 8 - 23 mg/dL _0 Creatinine 0.61 - 1.24 mg/dL 1.11 0.89 0.96  Sodium 135 - 145 mmol/L 138 137 135  Potassium 3.5 - 5.1 mmol/L 3.5 3.3(L) 3.5  Chloride 98 - 111 mmol/L 106 104 103  CO2 22 - 32 mmol/L _1 Calcium 8.9 - 10.3 mg/dL 8.4(L) 8.3(L) 8.4(L)  Total Protein 6.5 - 8.1 g/dL - - -  Total Bilirubin 0.3 - 1.2 mg/dL - - -  Alkaline Phos 38 - 126 U/L - - -  AST 15 - 41 U/L - - -  ALT 0 - 44 U/L - - -  Component Value Date/Time   RBC 3.52 (L) 10/22/2020 0759   MCV 92.0 10/22/2020 0759   MCH 29.0 10/22/2020 0759   MCHC 31.5 10/22/2020 0759   RDW 16.9 (H) 10/22/2020 0759   LYMPHSABS 0.8 10/21/2020 0511   MONOABS 0.7 10/21/2020 0511   EOSABS 0.6 (H) 10/21/2020 0511   BASOSABS 0.0 10/21/2020 0511    DIAGNOSTIC IMAGING:  I have independently reviewed the scans and discussed with the patient. No  results found.   ASSESSMENT:  1.  Unprovoked pulmonary embolism: -CT angio chest on 11/06/2019 shows partially occlusive thrombus in the posterior right segmental and subsegmental pulmonary arterial branch and right middle lobe subsegmental branches with no evidence of right ventricular heart strain. -Patient denied any immobilization from surgery or trauma prior to that episode. -He was started on Eliquis and is tolerating it very well.  He is currently on 2.5 mg twice daily.  This was increased to 5 mg twice daily by his cardiologist Dr. [name]. 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 -Doppler on 06/17/2020 was negative for DVT.  2.  Social/family history: -He worked as a Writer.  He smoked 2 pack/day for 15 years and quit 20 years ago. -He lives at home by himself.  His daughter lives next door and helps him with day-to-day activities. -No family history of thrombosis.  Mother had colon cancer and another daughter had breast cancer.   PLAN:  1.  Unprovoked pulmonary embolism: -He is currently taking Eliquis 5 mg twice daily. -He denies any bleeding issues with the current dose of Eliquis. -He had colostomy reversal with side-to-side anastomosis on 10/18/2020.  We reviewed his D-dimer which was slightly high at 0.8. -I have recommended continuation of Eliquis at this time.  We will reevaluate him in 6 months for follow-up with repeat D-dimer.  Orders placed this encounter:  Orders Placed This Encounter  Procedures  . D-dimer, quantitative     Derek Jack, MD [place] [phone]   I, Milinda Antis, am acting as a scribe for Dr. Sanda Linger.  I, Derek Jack MD, have reviewed the above documentation for accuracy and completeness, and I agree with the above.

## 2021-02-06 NOTE — Patient Instructions (Signed)
[place] at [facility] Discharge Instructions  You were seen today by Dr. Delton Coombes. He went over your recent results. Continue taking Eliquis 5 mg twice daily. Dr. Delton Coombes will see you back in 6 months for labs and follow up.   Thank you for choosing [place] at [facility] to provide your oncology and hematology care.  To afford each patient quality time with our provider, please arrive at least 15 minutes before your scheduled appointment time.   If you have a lab appointment with the [facility] please come in thru the Main Entrance and check in at the main information desk  You need to re-schedule your appointment should you arrive 10 or more minutes late.  We strive to give you quality time with our providers, and arriving late affects you and other patients whose appointments are after yours.  Also, if you no show three or more times for appointments you may be dismissed from the clinic at the providers discretion.     Again, thank you for choosing [facility].  Our hope is that these requests will decrease the amount of time that you wait before being seen by our physicians.       _____________________________________________________________  Should you have questions after your visit to [facility], please contact our office at [phone] between the hours of 8:00 a.m. and 4:30 p.m.  Voicemails left after 4:00 p.m. will not be returned until the following business day.  For prescription refill requests, have your pharmacy contact our office and allow 72 hours.    Cancer Center Support Programs:   > Cancer Support Group  2nd Tuesday of the month 1pm-2pm, Journey Room

## 2021-02-19 DIAGNOSIS — G4733 Obstructive sleep apnea (adult) (pediatric): Secondary | ICD-10-CM | POA: Diagnosis not present

## 2021-02-21 DIAGNOSIS — E7849 Other hyperlipidemia: Secondary | ICD-10-CM | POA: Diagnosis not present

## 2021-02-21 DIAGNOSIS — N138 Other obstructive and reflux uropathy: Secondary | ICD-10-CM | POA: Diagnosis not present

## 2021-02-21 DIAGNOSIS — E1122 Type 2 diabetes mellitus with diabetic chronic kidney disease: Secondary | ICD-10-CM | POA: Diagnosis not present

## 2021-02-21 DIAGNOSIS — R5383 Other fatigue: Secondary | ICD-10-CM | POA: Diagnosis not present

## 2021-02-21 DIAGNOSIS — E782 Mixed hyperlipidemia: Secondary | ICD-10-CM | POA: Diagnosis not present

## 2021-02-21 DIAGNOSIS — I10 Essential (primary) hypertension: Secondary | ICD-10-CM | POA: Diagnosis not present

## 2021-02-25 DIAGNOSIS — J449 Chronic obstructive pulmonary disease, unspecified: Secondary | ICD-10-CM | POA: Diagnosis not present

## 2021-02-27 DIAGNOSIS — I4821 Permanent atrial fibrillation: Secondary | ICD-10-CM | POA: Diagnosis not present

## 2021-02-27 DIAGNOSIS — R609 Edema, unspecified: Secondary | ICD-10-CM | POA: Diagnosis not present

## 2021-02-27 DIAGNOSIS — E7849 Other hyperlipidemia: Secondary | ICD-10-CM | POA: Diagnosis not present

## 2021-02-27 DIAGNOSIS — I10 Essential (primary) hypertension: Secondary | ICD-10-CM | POA: Diagnosis not present

## 2021-02-27 DIAGNOSIS — N138 Other obstructive and reflux uropathy: Secondary | ICD-10-CM | POA: Diagnosis not present

## 2021-02-27 DIAGNOSIS — E1122 Type 2 diabetes mellitus with diabetic chronic kidney disease: Secondary | ICD-10-CM | POA: Diagnosis not present

## 2021-02-27 DIAGNOSIS — Z86711 Personal history of pulmonary embolism: Secondary | ICD-10-CM | POA: Diagnosis not present

## 2021-02-27 DIAGNOSIS — K5790 Diverticulosis of intestine, part unspecified, without perforation or abscess without bleeding: Secondary | ICD-10-CM | POA: Diagnosis not present

## 2021-03-07 IMAGING — DX DG CHEST 1V PORT
1 series · 1 of 1 positions shown · non-contrast
Comparison: November 13, 2019

CLINICAL DATA: Shortness of breath

EXAM:
PORTABLE CHEST 1 VIEW

[chest ap grid]
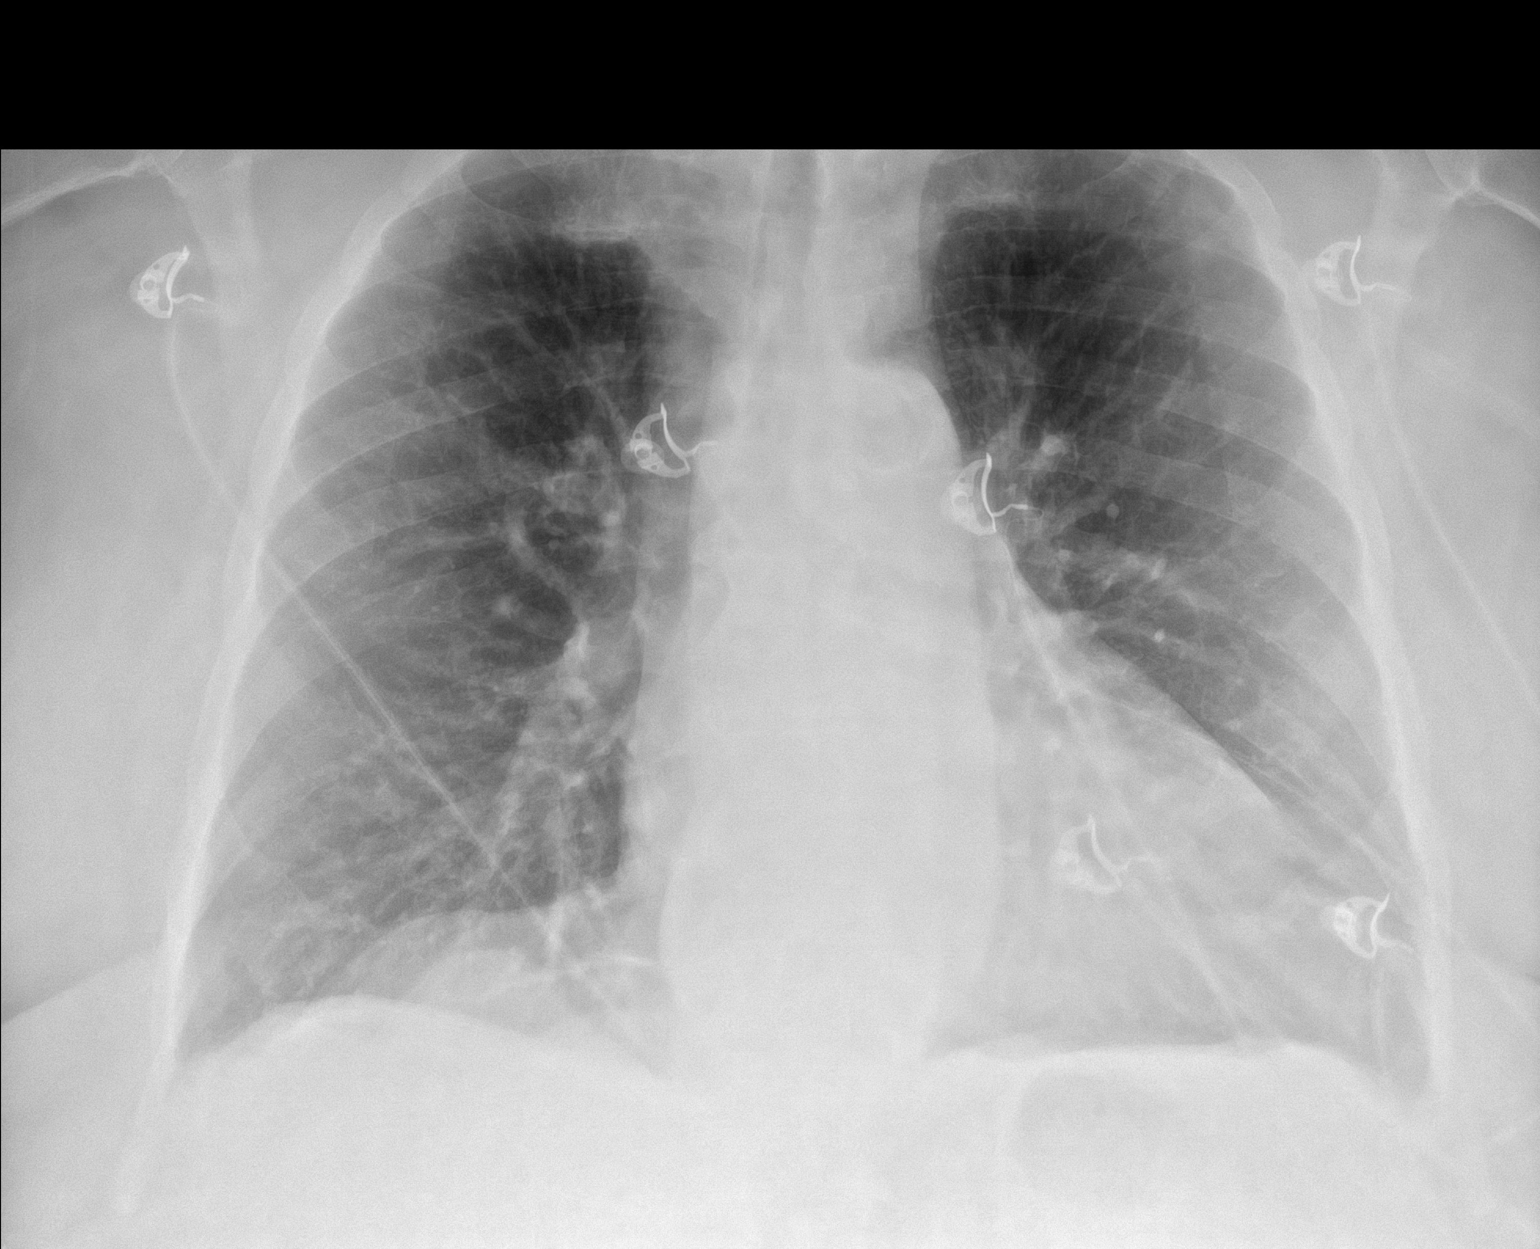

[1 of 1 positions shown; findings below may reference images not displayed]

FINDINGS: The heart size and mediastinal contours are unchanged with mild
cardiomegaly. Aortic knob calcifications. There is prominence of the
central pulmonary vasculature. No large airspace consolidation or
pleural effusion. No acute osseous abnormality.
IMPRESSION: Stable cardiomegaly and pulmonary vascular congestion.

## 2021-03-21 DIAGNOSIS — G4733 Obstructive sleep apnea (adult) (pediatric): Secondary | ICD-10-CM | POA: Diagnosis not present

## 2021-04-12 ENCOUNTER — Other Ambulatory Visit: Payer: Self-pay | Admitting: Cardiology

## 2021-04-16 ENCOUNTER — Encounter: Payer: Self-pay | Admitting: Cardiology

## 2021-04-16 NOTE — Progress Notes (Signed)
Primary Care Provider: Manon Hilding, MD Cardiologist: Carlyle Dolly, MD Electrophysiologist: None  Clinic Note: Chief Complaint  Patient presents with   Follow-up    34-month  Atrial Fibrillation    Remains asymptomatic.    ===================================  ASSESSMENT/PLAN   Problem List Items Addressed This Visit     Permanent atrial fibrillation ([facility]: CHA2DS2Vasc = 5. Eliquis - Primary (Chronic)    He remains completely asymptomatic.  Rate controlled on low-dose beta-blocker and diltiazem. He is back on full dose apixaban.       Relevant Orders   EKG 12-Lead (Completed)   Essential hypertension (Chronic)    Stable blood pressure on beta-blocker and diltiazem.  Not on ACE inhibitor/ARB because of the necessity for calcium channel blocker for rate control. He is on standing dose of Demadex and doing relatively well.  Swelling comes and goes.  Okay to take additional dose if necessary.       Chronic diastolic HF (heart failure) (HCC) (Chronic)    He has some microvascular ischemia as well as hypertensive heart disease with some diastolic function exacerbated by obesity deconditioning, COPD and OHS/OSA.  At his advanced age, he seems to be quite happy with how he is feeling.  Relatively asymptomatic with the A. fib.  No real PND orthopnea just some edema.  If pressures were high enough, can consider adding ARB, but for now we will just continue with low-dose beta-blocker and diltiazem along with standing dose torsemide.       Coronary artery disease, non-occlusive (Chronic)   Relevant Orders   EKG 12-Lead (Completed)   Hyperlipidemia associated with type 2 diabetes mellitus ([place] (Chronic)    He does have extensive coronary artery atherosclerosis noted on coronary calcium score but nonobstructive CAD by CTA.   Target LDL less than 70, but we are happy with less than 100 for now. Neck For now we will continue current dose of rosuvastatin 40 mg daily.  He is  on multiple medications and would prefer to avoid additional meds.  If not improved by next check may want to consider adding Zetia..  Interestingly, his A1c is down to 5.7.  No longer on medications.  Low threshold to consider GLP-1 agonist or SGLT2 inhibitor if additional glycemic troll is necessary.         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  No bleeding issues.  No residual effect on pulmonary pressures.  Stable on Eliquis.--Okay to hold 1 to 2 days preop for surgical procedures.  For high risk neurologic procedures: 3 days.       ===================================  HPI:    Vincent Cooper a morbidly obese 80y.o. male with a PMH notable for Chronic HFpEF, Nonocclusive CAD (by Coronary CTA), history of DVT-PE and essentially PERMANENT A. FIB on long-term anticoagulation who presents today for 684-monthollow-up. -> Suffered COVID-19 pneumonia; has had partial colectomy with colostomy takedown in December 2021.  Vincent Cooper last seen on 11/09/2020 for postop follow-up.  He was doing great, feeling well.  Energy level improved.  Is very happy to be "reconnected ".  Doing PT.  Was using oxygen for PT but otherwise doing well.  Appetite improving.  Bowels seem to be working well.  Baseline exertional dyspnea/exercise intolerance, but edema improved.  No sense of A. Fib. Increased apixaban back to 5 mg twice daily.  Continue Demadex 20  mg basal dose with sliding scale. ACE inhibitor has been discontinued in the hospital because of hypotension.  Not restarted.  Continued on beta-blocker and calcium channel blocker.  Recent Hospitalizations: None  Reviewed  CV studies:    The following studies were reviewed today: (if available, images/films reviewed: From Epic Chart or Care Everywhere) None:   Interval History:   Vincent Cooper returns here today  for 35-monthfollow-up doing quite well.  He continues to try to be as active as possible.  Able to walk intermittently, but has to stop to catch his breath and to rest as much because of arthritis pains is from dyspnea.  He likes to do gardening, sits in the chair and bends over to do a couple plan to the time.  He denies any chest pain or pressure with rest or exertion.  No PND, orthopnea with trivial edema which comes and goes depending on how long he has been on his feet and what he eats.  Usually controlled with his torsemide.  He denies any sensation of rapid irregular heartbeats or palpitations.  Has no sensation of being in A. fib.  CV Review of Symptoms (Summary) positive for - dyspnea on exertion, edema and Stable exertional dyspnea and exercise intolerance negative for - chest pain, irregular heartbeat, orthopnea, palpitations, paroxysmal nocturnal dyspnea, rapid heart rate, shortness of breath or Lightheadedness, dizziness, wooziness or syncope/near syncope, TIA/amaurosis fugax.  Melena, hematochezia, hematuria or epistaxis.   REVIEWED OF SYSTEMS   Review of Systems  Constitutional:  Negative for malaise/fatigue and weight loss.  HENT:  Positive for hearing loss. Negative for nosebleeds.   Respiratory:  Positive for shortness of breath (Stable exertional dyspnea). Negative for cough.   Cardiovascular:  Positive for leg swelling (Stable).  Gastrointestinal:  Negative for blood in stool and melena.  Genitourinary:  Negative for flank pain and hematuria.  Musculoskeletal:  Positive for joint pain (Limited by knee and hip pain).  Neurological:  Negative for dizziness, focal weakness and weakness.  Psychiatric/Behavioral:  Positive for memory loss. Negative for depression. The patient is not nervous/anxious and does not have insomnia.   I have reviewed and (if needed) personally updated the patient's problem list, medications, allergies, past medical and surgical history, social and family  history.   PAST MEDICAL HISTORY   Past Medical History:  Diagnosis Date   Chronic gout    COPD (chronic obstructive pulmonary disease) ([place]    Oxygen dependent   Coronary artery disease due to calcified coronary lesion 03/08/2020   CORONARY CTA: Cor Ca++ Score 1651 !!: ~ 50% LM (CTFFR 1 - NS). Large Dom RCA-<PDA-PAV/PL - diffuse mild-mod plaque: prox (25-49%), mid (50-69%) CTFFR (p 0.99, m 0.85, d 0.81 - NS).  Med-Size LAD - prox-mid long diffuse Mod-Severe Ca++ plaque (~50-69%, ?>70%): CTFFR p 0.95, m 0.88, d 0.86 -NS.  CTFFR LCx 0.94, OM1 0.90 - NS. (NS=Not Significant).   Diabetes mellitus type II, non insulin dependent (HCC)    Hypertension    Morbid obesity (HCC)    BMI of 38.5 with multiple risk factors.   OSA on CPAP    Pulmonary emboli ([place] 10/2019   Chest CTA-4 [place] opacification of main PA but there is partially occlusive main posterior RLL and segmental/segmental branches.  Small thrombus noted in the anterior right middle lobe.  No RV strain.  Scattered aortic atherosclerosis involving great vessels.  Coronary calcification noted.   RLS (restless legs syndrome)     PAST SURGICAL HISTORY   Past  Surgical History:  Procedure Laterality Date   BOWEL RESECTION N/A 10/18/2020   Procedure: SMALL BOWEL RESECTION;  Surgeon: Virl Cagey, MD;  Location: [facility]S;  Service: General;  Laterality: N/A;   Cataract surgery Right    COLONOSCOPY WITH PROPOFOL N/A 09/27/2020   Procedure: COLONOSCOPY WITH PROPOFOL;  Surgeon: Rogene Houston, MD;  Location: [facility] END[facility] SUITE;  Service: Endoscopy;  Laterality: N/A;  1:15   COLOSTOMY REVERSAL N/A 10/18/2020   Procedure: COLOSTOMY REVERSAL;  Surgeon: Virl Cagey, MD;  Location: [facility]S;  Service: General;  Laterality: N/A;   LAPAROTOMY N/A 06/21/2020   Procedure: EXPLORATORY LAPAROTOMY,  bowel resection, creation ostomy;  Surgeon: Virl Cagey, MD;  Location: [facility];  Service: General;  Laterality: N/A;   POLYPECTOMY  09/27/2020    Procedure: POLYPECTOMY;  Surgeon: Rogene Houston, MD;  Location: [facility];  Service: Endoscopy;;   TRANSTHORACIC ECHOCARDIOGRAM  10/20/2020   EF 60 to 65%.  Moderate LVH at least GR 1 DD.  Mild to moderate LA dilation.  Moderate aortic valve calcification-sclerosis with no stenosis.  Normal RAP.   ECHO 10/20/2020: EF 60 to 65%.  Moderate LVH at least GR 1 DD.  Mild to moderate LA dilation.  Moderate aortic valve calcification-sclerosis with no stenosis.  Normal RAP.  Immunization History  Administered Date(s) Administered   Influenza Inj Mdck Quad With Preservative 08/28/2018   Influenza,inj,quad, With Preservative 08/29/2015    MEDICATIONS/ALLERGIES   Current Meds  Medication Sig   albuterol (VENTOLIN HFA) 108 (90 Base) MCG/ACT inhaler Inhale 1 puff into the lungs every 6 (six) hours as needed for wheezing or shortness of breath.   apixaban (ELIQUIS) 5 MG TABS tablet Take 1 tablet (5 mg total) by mouth 2 (two) times daily.   betamethasone dipropionate 0.05 % cream Apply topically 2 (two) times daily.   Blood Glucose Monitoring Suppl (ACCU-CHEK GUIDE) w/Device KIT USE TO TEST DAILY ASEDIRECTD.R   docusate sodium (COLACE) 100 MG capsule Take 1 capsule (100 mg total) by mouth 2 (two) times daily as needed for mild constipation.   guaiFENesin (MUCINEX) 600 MG 12 hr tablet Take 1 tablet (600 mg total) by mouth 2 (two) times daily.   ibuprofen (ADVIL) 200 MG tablet Take 400 mg by mouth every 6 (six) hours as needed for moderate pain.   ipratropium-albuterol (DUONEB) 0.5-2.5 (3) MG/3ML SOLN Take 3 mLs by nebulization every 6 (six) hours as needed (shortness of breath).    metoprolol tartrate (LOPRESSOR) 25 MG tablet Take 1 tablet (25 mg total) by mouth 2 (two) times daily.   nitroGLYCERIN (NITROSTAT) 0.4 MG SL tablet Place 1 tablet (0.4 mg total) under the tongue every 5 (five) minutes as needed for chest pain.   potassium chloride SA (KLOR-CON) 20 MEQ tablet Take 20 mEq by mouth daily.    rosuvastatin (CRESTOR) 40 MG tablet TAKE 1 TABLET BY MOUTH ONCE DAILY.   tamsulosin (FLOMAX) 0.4 MG CAPS capsule Take 1 capsule (0.4 mg total) by mouth daily. (Patient taking differently: Take 0.4 mg by mouth at bedtime.)   torsemide (DEMADEX) 20 MG tablet Take 1 tablet (20 mg total) by mouth 2 (two) times daily. May take an extra 20 mg if needed for increase swelling   TRELEGY ELLIPTA 200-62.5-25 MCG/INH AEPB Inhale 1 puff into the lungs daily.    triamcinolone cream (KENALOG) 0.1 % Apply 1 application topically 2 (two) times daily.     Allergies  Allergen Reactions   Other  Patient reports he was allergic to something in an IV he was given but does not know what the substance was. As of 01/19/2020     SOCIAL HISTORY/FAMILY HISTORY   Reviewed in Epic:  Pertinent findings:  Social History   Tobacco Use   Smoking status: Former    Pack years: 0.00   Smokeless tobacco: Former  Scientific laboratory technician Use: Never used  Substance Use Topics   Alcohol use: Not Currently   Drug use: Never   Social History   Social History Narrative   Not on file    OBJCTIVE -PE, EKG, labs   Wt Readings from Last 3 Encounters:  04/17/21 252 lb 12.8 oz (114.7 kg)  02/06/21 250 lb 1.6 oz (113.4 kg)  12/14/20 240 lb (108.9 kg)   Physical Exam: BP 124/78   Pulse 66   Ht _0  (1.753 m)   Wt 252 lb 12.8 oz (114.7 kg)   SpO2 97%   BMI 37.33 kg/m  Physical Exam Vitals reviewed.  Constitutional:      General: He is not in acute distress.    Appearance: Normal appearance. He is obese. He is not ill-appearing or toxic-appearing.     Comments: Well-groomed.  Besides being morbidly obese, otherwise healthy-appearing.  HENT:     Head: Normocephalic and atraumatic.     Ears:     Comments: Very hard of hearing Neck:     Vascular: No carotid bruit.  Cardiovascular:     Rate and Rhythm: Normal rate. Rhythm irregularly irregular.     Chest Wall: PMI is not displaced (Unable to palpate).      Pulses: Decreased pulses (Diminished due to body habitus, and mild edema.).          Dorsalis pedis pulses are 1+ on the right side and 1+ on the left side.       Posterior tibial pulses are 1+ on the right side and 1+ on the left side.     Heart sounds: S1 normal and S2 normal. Heart sounds are distant. Murmur (1/6 harsh C-D SEM at RUSB) heard.    No friction rub. No gallop.  Pulmonary:     Effort: Pulmonary effort is normal. No respiratory distress.     Breath sounds: Normal breath sounds. No wheezing, rhonchi or rales.  Chest:     Chest wall: No tenderness.  Musculoskeletal:     Cervical back: Normal range of motion and neck supple.     Right lower leg: Edema (Trace to 1+) present.     Left lower leg: Edema (1+) present.  Skin:    General: Skin is warm and dry.     Comments: Mild venous stasis changes-violaceous skin on both lower extremities.  Neurological:     General: No focal deficit present.     Mental Status: He is alert and oriented to person, place, and time.  Psychiatric:        Mood and Affect: Mood normal.        Behavior: Behavior normal.        Thought Content: Thought content normal.        Judgment: Judgment normal.    Adult ECG Report  Rate: 66 ;  Rhythm: atrial fibrillation and Mild nonspecific ST and T wave changes. ;   Narrative Interpretation: Stable  Recent Labs:   02/21/2021: TC 180, TG 71, HDL 33, LDL 95.  A1c 5.7.  Hgb 14.3, Cr 1.25, K+ 3.5. Lab Results  Component  Value Date   CHOL 264 (H) 03/20/2020   HDL 99 03/20/2020   LDLCALC 135 (H) 03/20/2020   TRIG 151 (H) 06/22/2020   CHOLHDL 2.7 03/20/2020   Lab Results  Component Value Date   CREATININE 1.11 10/23/2020   BUN 13 10/23/2020   NA 138 10/23/2020   K 3.5 10/23/2020   CL 106 10/23/2020   CO2 24 10/23/2020   CBC Latest Ref Rng & Units 10/22/2020 10/21/2020 10/20/2020  WBC 4.0 - 10.5 K/uL 6.5 7.6 9.5  Hemoglobin 13.0 - 17.0 g/dL 10.2(L) 11.2(L) 10.7(L)  Hematocrit 39.0 - 52.0 % 32.4(L)  35.7(L) 33.6(L)  Platelets 150 - 400 K/uL 215 204 211    Lab Results  Component Value Date   TSH 4.978 (H) 10/20/2020    ==================================================  COVID-19 Education: The signs and symptoms of COVID-19 were discussed with the patient and how to seek care for testing (follow up with PCP or arrange E-visit).    I spent a total of 24 minutes with the patient spent in direct patient consultation.  Additional time spent with chart review  / charting (studies, outside notes, etc): 10 min Total Time: 34 min   Current medicines are reviewed at length with the patient today.  (+/- concerns) /a  This visit occurred during the SARS-CoV-2 public health emergency.  Safety protocols were in place, including screening questions prior to the visit, additional usage of staff PPE, and extensive cleaning of exam room while observing appropriate contact time as indicated for disinfecting solutions.  Notice: This dictation was prepared with Dragon dictation along with smaller phrase technology. Any transcriptional errors that result from this process are unintentional and may not be corrected upon review.  Patient Instructions / Medication Changes & Studies & Tests Ordered   Patient Instructions  Medication Instructions:  NO CHANGES *If you need a refill on your cardiac medications before your next appointment, please call your pharmacy*   Lab Work: NOT NEEDED    Testing/Procedures: NOT NEEDED   Follow-Up: At [facility] you and your health needs are our priority.  As part of our continuing mission to provide you with exceptional heart care, we have created designated Provider Care Teams.  These Care Teams include your primary Cardiologist (physician) and Advanced Practice Providers (APPs -  Physician Assistants and Nurse Practitioners) who all work together to provide you with the care you need, when you need it.  We recommend signing up for the patient portal  called "MyChart".  Sign up information is provided on this After Visit Summary.  MyChart is used to connect with patients for Virtual Visits (Telemedicine).  Patients are able to view lab/test results, encounter notes, upcoming appointments, etc.  Non-urgent messages can be sent to your provider as well.   To learn more about what you can do with MyChart, go to [email or web address].    Your next appointment:   12 month(s)  The format for your next appointment:   In Person  Provider:   Glenetta Hew, MD      Studies Ordered:   Orders Placed This Encounter  Procedures   EKG 12-Lead     Glenetta Hew, M.D., M.S. Interventional Cardiologist   Pager # [phone] Phone # [phone] [address] [place], [place] [address]   Thank you for choosing Heartcare at [facility]!!

## 2021-04-17 ENCOUNTER — Ambulatory Visit (INDEPENDENT_AMBULATORY_CARE_PROVIDER_SITE_OTHER): Payer: Medicare HMO | Admitting: Cardiology

## 2021-04-17 ENCOUNTER — Other Ambulatory Visit: Payer: Self-pay

## 2021-04-17 ENCOUNTER — Encounter: Payer: Self-pay | Admitting: Cardiology

## 2021-04-17 VITALS — BP 124/78 | HR 66 | Ht 69.0 in | Wt 252.8 lb

## 2021-04-17 DIAGNOSIS — E1169 Type 2 diabetes mellitus with other specified complication: Secondary | ICD-10-CM

## 2021-04-17 DIAGNOSIS — E785 Hyperlipidemia, unspecified: Secondary | ICD-10-CM

## 2021-04-17 DIAGNOSIS — T45515A Adverse effect of anticoagulants, initial encounter: Secondary | ICD-10-CM

## 2021-04-17 DIAGNOSIS — I2699 Other pulmonary embolism without acute cor pulmonale: Secondary | ICD-10-CM

## 2021-04-17 DIAGNOSIS — I5032 Chronic diastolic (congestive) heart failure: Secondary | ICD-10-CM | POA: Diagnosis not present

## 2021-04-17 DIAGNOSIS — I10 Essential (primary) hypertension: Secondary | ICD-10-CM

## 2021-04-17 DIAGNOSIS — T45515D Adverse effect of anticoagulants, subsequent encounter: Secondary | ICD-10-CM

## 2021-04-17 DIAGNOSIS — I4821 Permanent atrial fibrillation: Secondary | ICD-10-CM | POA: Diagnosis not present

## 2021-04-17 DIAGNOSIS — I2510 Atherosclerotic heart disease of native coronary artery without angina pectoris: Secondary | ICD-10-CM | POA: Diagnosis not present

## 2021-04-17 NOTE — Patient Instructions (Signed)

## 2021-04-29 ENCOUNTER — Encounter: Payer: Self-pay | Admitting: Cardiology

## 2021-04-29 NOTE — Assessment & Plan Note (Signed)
He is already on long-term anticoagulation because of atrial fibrillation.  No bleeding issues.  No residual effect on pulmonary pressures.  Stable on Eliquis.--Okay to hold 1 to 2 days preop for surgical procedures.  For high risk neurologic procedures: 3 days.

## 2021-04-29 NOTE — Assessment & Plan Note (Addendum)
He does have extensive coronary artery atherosclerosis noted on coronary calcium score but nonobstructive CAD by CTA.   Target LDL less than 70, but we are happy with less than 100 for now. Neck For now we will continue current dose of rosuvastatin 40 mg daily.  He is on multiple medications and would prefer to avoid additional meds.  If not improved by next check may want to consider adding Zetia..  Interestingly, his A1c is down to 5.7.  No longer on medications.  Low threshold to consider GLP-1 agonist or SGLT2 inhibitor if additional glycemic troll is necessary.

## 2021-04-29 NOTE — Assessment & Plan Note (Signed)
Stable blood pressure on beta-blocker and diltiazem.  Not on ACE inhibitor/ARB because of the necessity for calcium channel blocker for rate control. He is on standing dose of Demadex and doing relatively well.  Swelling comes and goes.  Okay to take additional dose if necessary.

## 2021-04-29 NOTE — Assessment & Plan Note (Signed)
He has some microvascular ischemia as well as hypertensive heart disease with some diastolic function exacerbated by obesity deconditioning, COPD and OHS/OSA.  At his advanced age, he seems to be quite happy with how he is feeling.  Relatively asymptomatic with the A. fib.  No real PND orthopnea just some edema.  If pressures were high enough, can consider adding ARB, but for now we will just continue with low-dose beta-blocker and diltiazem along with standing dose torsemide.

## 2021-04-29 NOTE — Assessment & Plan Note (Signed)
He remains completely asymptomatic.  Rate controlled on low-dose beta-blocker and diltiazem. He is back on full dose apixaban.

## 2021-05-08 IMAGING — DX DG CHEST 1V PORT
1 series · 1 of 1 positions shown · non-contrast
Comparison: 01/19/2020 and earlier, including CTA chest
11/06/2019.

CLINICAL DATA: Acute onset of shortness of breath and BILATERAL
lower extremity edema that began last night. Wheezing on clinical
examination.

EXAM:
PORTABLE CHEST 1 VIEW

[chest ap]
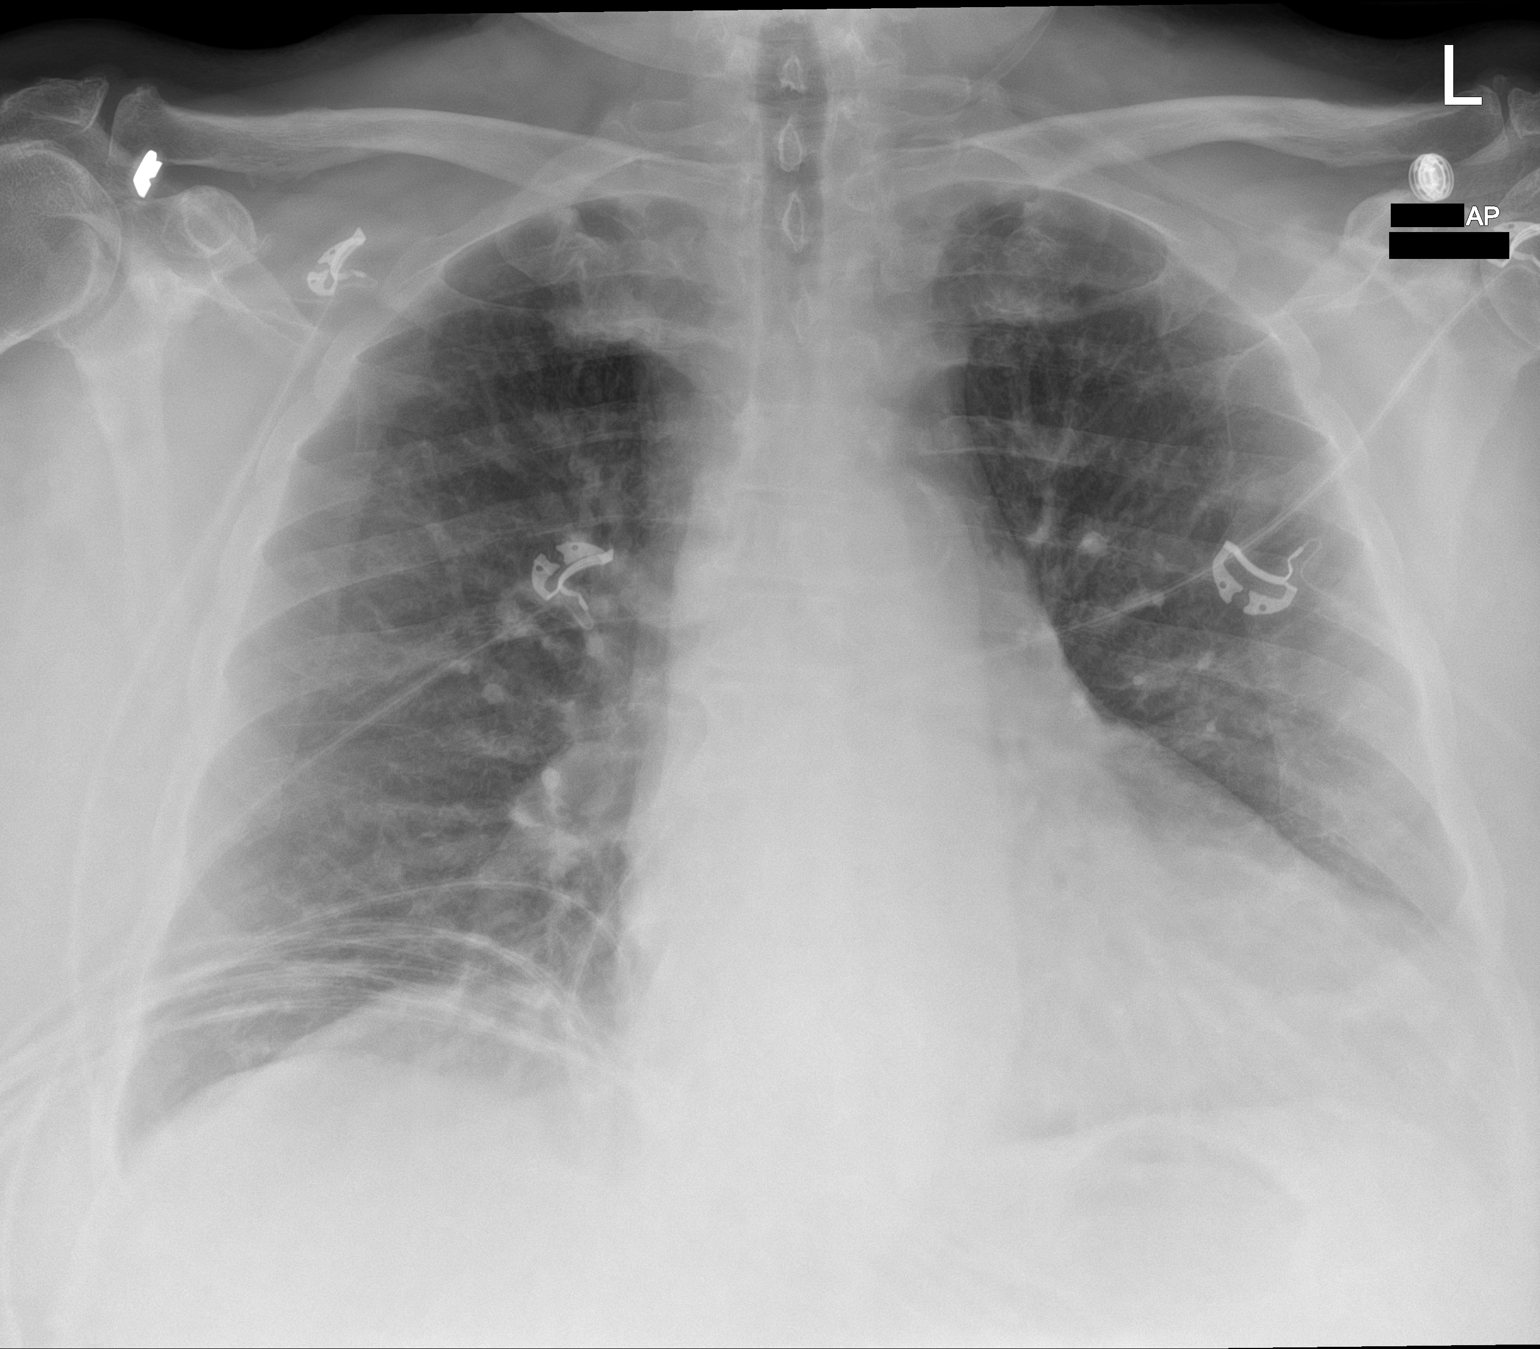

[1 of 1 positions shown; findings below may reference images not displayed]

FINDINGS: Cardiac silhouette upper normal in size to slightly enlarged for AP
portable technique, unchanged. Thoracic aorta atherosclerotic,
unchanged. Prominent mediastinal fat in the inferior mediastinum as
noted on the prior CT accounts for the apparent mass in the lower
mediastinum.

Mild pulmonary venous hypertension without overt edema. Lungs clear.
No visible pleural effusions.
IMPRESSION: Stable borderline to mild cardiomegaly. No acute cardiopulmonary
disease.

## 2021-07-04 DIAGNOSIS — G4733 Obstructive sleep apnea (adult) (pediatric): Secondary | ICD-10-CM | POA: Diagnosis not present

## 2021-07-18 ENCOUNTER — Other Ambulatory Visit: Payer: Self-pay | Admitting: Cardiology

## 2021-08-09 ENCOUNTER — Inpatient Hospital Stay (HOSPITAL_COMMUNITY): Payer: Medicare HMO | Attending: Hematology

## 2021-08-16 ENCOUNTER — Ambulatory Visit (HOSPITAL_COMMUNITY): Payer: Medicare HMO | Admitting: Hematology

## 2021-08-27 IMAGING — DX DG CHEST 1V PORT
1 series · 1 of 1 positions shown · non-contrast
Comparison: 06/21/2020, CT 03/07/2020

CLINICAL DATA: Post intubation

EXAM:
PORTABLE CHEST 1 VIEW

[chest ap grid]
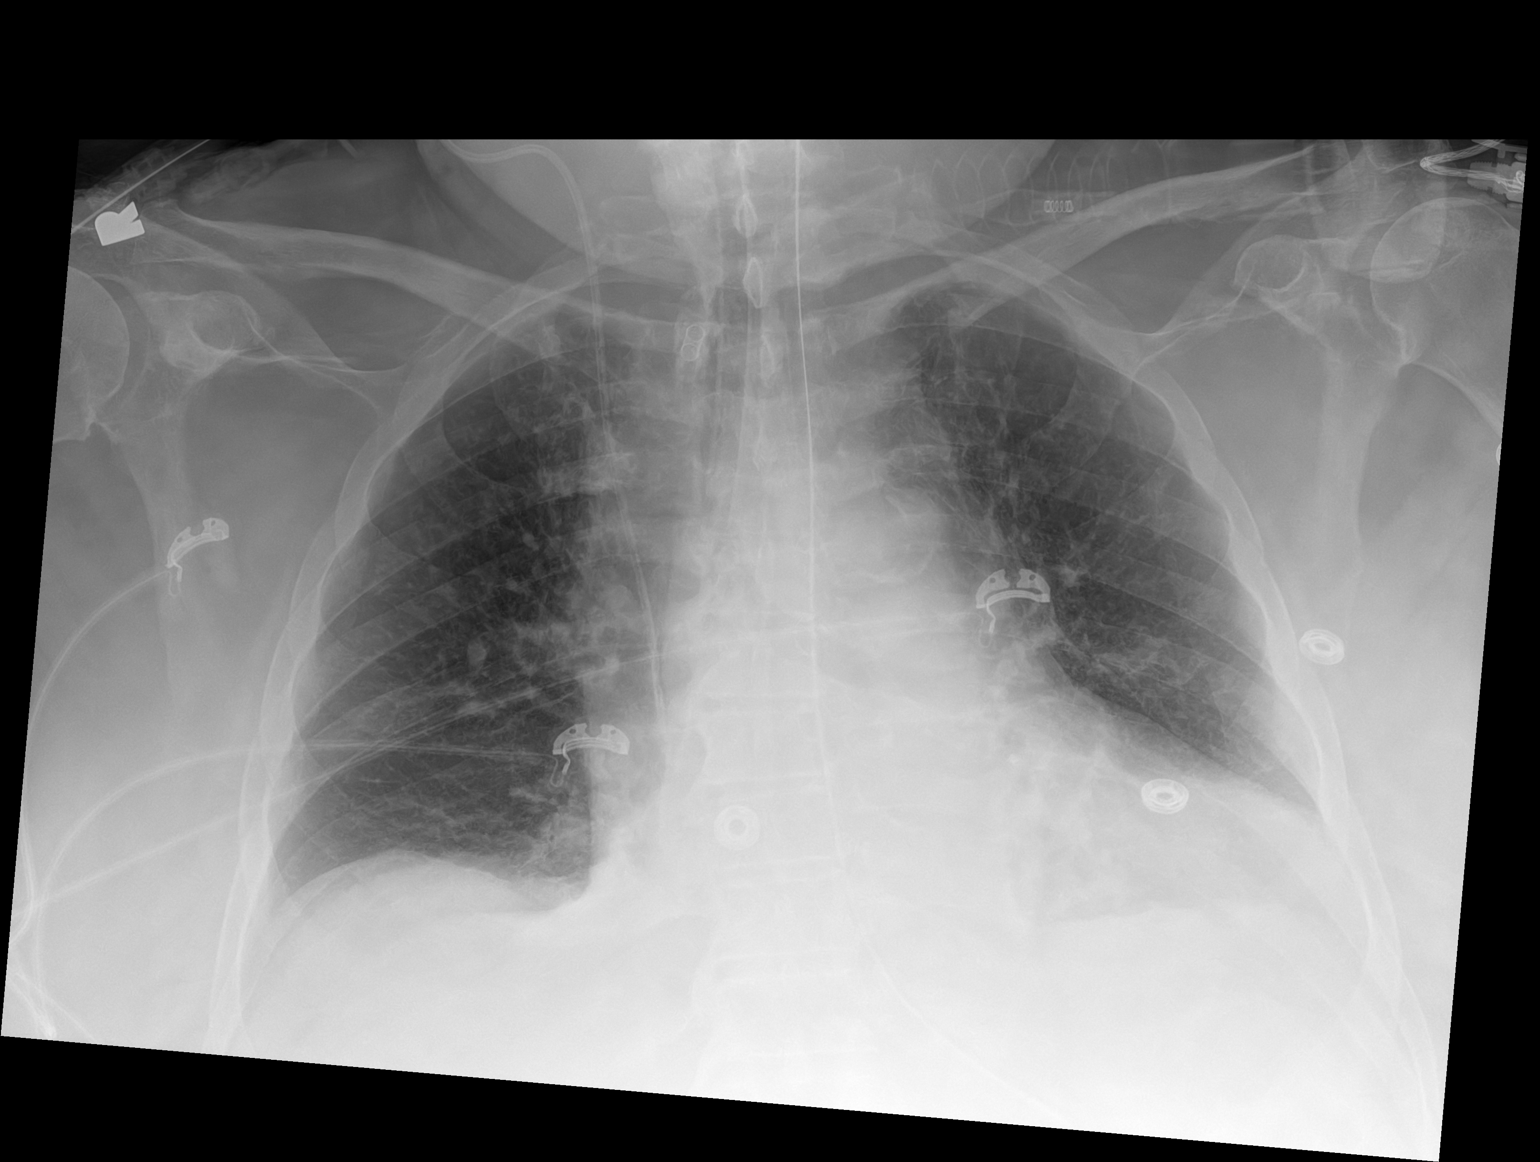

[1 of 1 positions shown; findings below may reference images not displayed]

FINDINGS: Interval intubation, tip of the endotracheal tube is about 14 mm
superior to the carina. Right-sided central venous catheter tip over
the cavoatrial region. Esophageal tube tip below the diaphragm but
incompletely visualized. Cardiomegaly with aortic atherosclerosis.
No pleural effusion or consolidation. Minimal lucency at the right
diaphragm could reflect small free air demonstrated on CT.
IMPRESSION: 1. Interval intubation with tip of the endotracheal tube about 14 mm
superior to the carina. Right IJ central venous catheter tip over
the cavoatrial region. No pneumothorax.
2. Cardiomegaly. No edema or infiltrate.
3. Possible small amount of free air beneath the right diaphragm,
demonstrated on previous CT.

## 2021-08-27 IMAGING — DX DG CHEST 1V PORT
1 series · 1 of 1 positions shown · non-contrast
Comparison: Single-view of the chest 06/17/2020 and 11/13/2019. CT
chest 11/06/2019.

CLINICAL DATA: Shortness of breath.  History of COPD.

EXAM:
PORTABLE CHEST 1 VIEW

[chest ap]
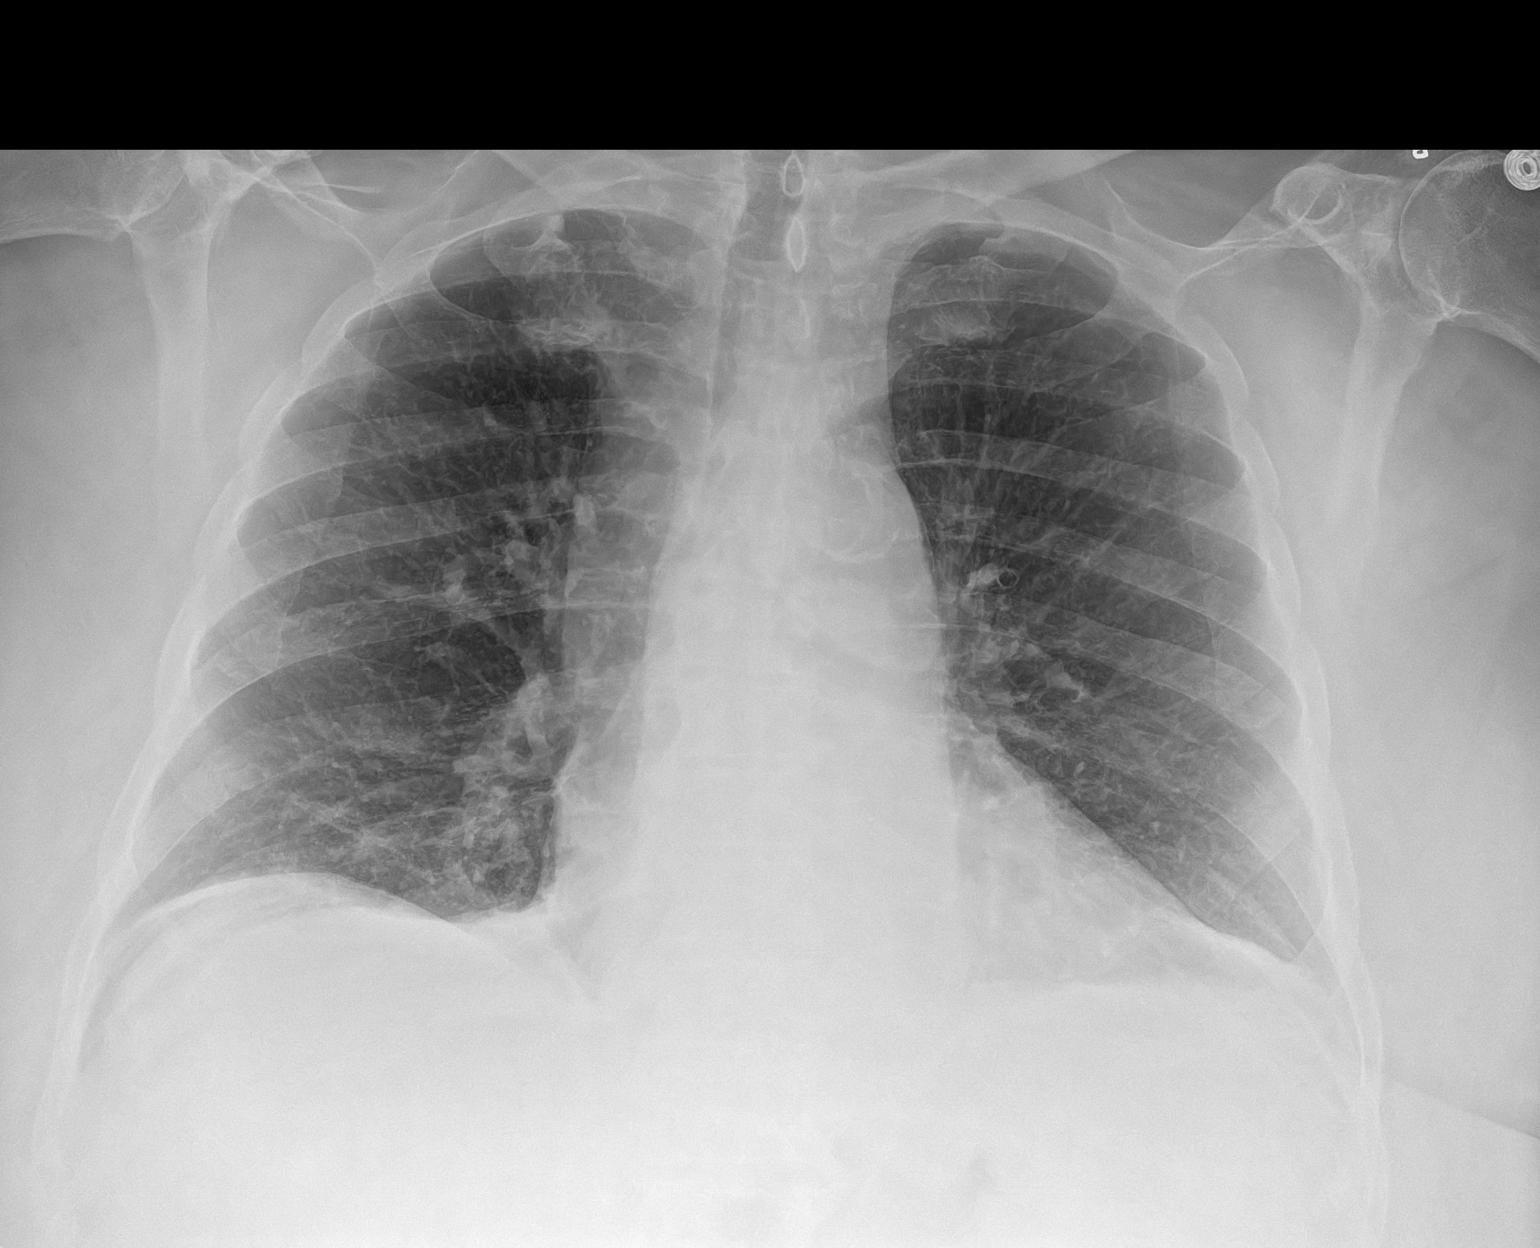

[1 of 1 positions shown; findings below may reference images not displayed]

FINDINGS: The lungs are emphysematous. Mild subsegmental atelectasis is seen
in the lung bases. The lungs are otherwise clear. Heart size is
normal. Aortic atherosclerosis. No pneumothorax or pleural fluid. No
acute bony abnormality.
IMPRESSION: No acute disease.

Aortic Atherosclerosis (WD4DP-T14.4) and Emphysema (WD4DP-Y6P.F).

## 2021-09-04 DIAGNOSIS — I10 Essential (primary) hypertension: Secondary | ICD-10-CM | POA: Diagnosis not present

## 2021-09-04 DIAGNOSIS — E1122 Type 2 diabetes mellitus with diabetic chronic kidney disease: Secondary | ICD-10-CM | POA: Diagnosis not present

## 2021-09-04 DIAGNOSIS — Z86711 Personal history of pulmonary embolism: Secondary | ICD-10-CM | POA: Diagnosis not present

## 2021-09-04 DIAGNOSIS — E7849 Other hyperlipidemia: Secondary | ICD-10-CM | POA: Diagnosis not present

## 2021-09-04 DIAGNOSIS — N183 Chronic kidney disease, stage 3 unspecified: Secondary | ICD-10-CM | POA: Diagnosis not present

## 2021-09-04 DIAGNOSIS — N138 Other obstructive and reflux uropathy: Secondary | ICD-10-CM | POA: Diagnosis not present

## 2021-09-04 DIAGNOSIS — I4821 Permanent atrial fibrillation: Secondary | ICD-10-CM | POA: Diagnosis not present

## 2021-09-04 DIAGNOSIS — E782 Mixed hyperlipidemia: Secondary | ICD-10-CM | POA: Diagnosis not present

## 2021-09-12 DIAGNOSIS — N183 Chronic kidney disease, stage 3 unspecified: Secondary | ICD-10-CM | POA: Diagnosis not present

## 2021-09-12 DIAGNOSIS — Z1331 Encounter for screening for depression: Secondary | ICD-10-CM | POA: Diagnosis not present

## 2021-09-12 DIAGNOSIS — I10 Essential (primary) hypertension: Secondary | ICD-10-CM | POA: Diagnosis not present

## 2021-09-12 DIAGNOSIS — I4821 Permanent atrial fibrillation: Secondary | ICD-10-CM | POA: Diagnosis not present

## 2021-09-12 DIAGNOSIS — Z86711 Personal history of pulmonary embolism: Secondary | ICD-10-CM | POA: Diagnosis not present

## 2021-09-12 DIAGNOSIS — L0390 Cellulitis, unspecified: Secondary | ICD-10-CM | POA: Diagnosis not present

## 2021-09-12 DIAGNOSIS — Z23 Encounter for immunization: Secondary | ICD-10-CM | POA: Diagnosis not present

## 2021-09-12 DIAGNOSIS — E1122 Type 2 diabetes mellitus with diabetic chronic kidney disease: Secondary | ICD-10-CM | POA: Diagnosis not present

## 2021-09-12 DIAGNOSIS — Z1389 Encounter for screening for other disorder: Secondary | ICD-10-CM | POA: Diagnosis not present

## 2021-11-08 DIAGNOSIS — G4733 Obstructive sleep apnea (adult) (pediatric): Secondary | ICD-10-CM | POA: Diagnosis not present

## 2022-01-11 DIAGNOSIS — R5383 Other fatigue: Secondary | ICD-10-CM | POA: Diagnosis not present

## 2022-01-11 DIAGNOSIS — E782 Mixed hyperlipidemia: Secondary | ICD-10-CM | POA: Diagnosis not present

## 2022-01-11 DIAGNOSIS — N183 Chronic kidney disease, stage 3 unspecified: Secondary | ICD-10-CM | POA: Diagnosis not present

## 2022-01-11 DIAGNOSIS — E1122 Type 2 diabetes mellitus with diabetic chronic kidney disease: Secondary | ICD-10-CM | POA: Diagnosis not present

## 2022-01-11 DIAGNOSIS — E7849 Other hyperlipidemia: Secondary | ICD-10-CM | POA: Diagnosis not present

## 2022-01-11 DIAGNOSIS — I10 Essential (primary) hypertension: Secondary | ICD-10-CM | POA: Diagnosis not present

## 2022-01-14 DIAGNOSIS — Z0001 Encounter for general adult medical examination with abnormal findings: Secondary | ICD-10-CM | POA: Diagnosis not present

## 2022-01-14 DIAGNOSIS — Z23 Encounter for immunization: Secondary | ICD-10-CM | POA: Diagnosis not present

## 2022-01-14 DIAGNOSIS — I4821 Permanent atrial fibrillation: Secondary | ICD-10-CM | POA: Diagnosis not present

## 2022-01-14 DIAGNOSIS — E1122 Type 2 diabetes mellitus with diabetic chronic kidney disease: Secondary | ICD-10-CM | POA: Diagnosis not present

## 2022-01-14 DIAGNOSIS — K5790 Diverticulosis of intestine, part unspecified, without perforation or abscess without bleeding: Secondary | ICD-10-CM | POA: Diagnosis not present

## 2022-01-14 DIAGNOSIS — Z86711 Personal history of pulmonary embolism: Secondary | ICD-10-CM | POA: Diagnosis not present

## 2022-01-14 DIAGNOSIS — R609 Edema, unspecified: Secondary | ICD-10-CM | POA: Diagnosis not present

## 2022-01-14 DIAGNOSIS — I10 Essential (primary) hypertension: Secondary | ICD-10-CM | POA: Diagnosis not present

## 2022-02-03 ENCOUNTER — Emergency Department (HOSPITAL_COMMUNITY): Payer: Medicare HMO

## 2022-02-03 ENCOUNTER — Encounter (HOSPITAL_COMMUNITY): Payer: Self-pay | Admitting: Emergency Medicine

## 2022-02-03 ENCOUNTER — Emergency Department (HOSPITAL_COMMUNITY)
Admission: EM | Admit: 2022-02-03 | Discharge: 2022-02-03 | Disposition: A | Discharge status: Home / Self Care | Payer: Medicare HMO | Attending: Emergency Medicine | Admitting: Emergency Medicine

## 2022-02-03 DIAGNOSIS — E871 Hypo-osmolality and hyponatremia: Secondary | ICD-10-CM | POA: Insufficient documentation

## 2022-02-03 DIAGNOSIS — E1165 Type 2 diabetes mellitus with hyperglycemia: Secondary | ICD-10-CM | POA: Diagnosis not present

## 2022-02-03 DIAGNOSIS — Z20822 Contact with and (suspected) exposure to covid-19: Secondary | ICD-10-CM | POA: Insufficient documentation

## 2022-02-03 DIAGNOSIS — R7989 Other specified abnormal findings of blood chemistry: Secondary | ICD-10-CM | POA: Diagnosis not present

## 2022-02-03 DIAGNOSIS — I517 Cardiomegaly: Secondary | ICD-10-CM | POA: Diagnosis not present

## 2022-02-03 DIAGNOSIS — I10 Essential (primary) hypertension: Secondary | ICD-10-CM | POA: Diagnosis not present

## 2022-02-03 DIAGNOSIS — I2510 Atherosclerotic heart disease of native coronary artery without angina pectoris: Secondary | ICD-10-CM | POA: Diagnosis not present

## 2022-02-03 DIAGNOSIS — Z79899 Other long term (current) drug therapy: Secondary | ICD-10-CM | POA: Insufficient documentation

## 2022-02-03 DIAGNOSIS — D72829 Elevated white blood cell count, unspecified: Secondary | ICD-10-CM | POA: Diagnosis not present

## 2022-02-03 DIAGNOSIS — R002 Palpitations: Secondary | ICD-10-CM | POA: Insufficient documentation

## 2022-02-03 DIAGNOSIS — Z7901 Long term (current) use of anticoagulants: Secondary | ICD-10-CM | POA: Insufficient documentation

## 2022-02-03 DIAGNOSIS — J441 Chronic obstructive pulmonary disease with (acute) exacerbation: Secondary | ICD-10-CM | POA: Diagnosis not present

## 2022-02-03 DIAGNOSIS — R0602 Shortness of breath: Secondary | ICD-10-CM

## 2022-02-03 DIAGNOSIS — Z7952 Long term (current) use of systemic steroids: Secondary | ICD-10-CM | POA: Insufficient documentation

## 2022-02-03 LAB — TROPONIN I (HIGH SENSITIVITY): Troponin I (High Sensitivity): 4 ng/L (ref <18)

## 2022-02-03 LAB — CBC WITH DIFFERENTIAL/PLATELET
Abs Immature Granulocytes: 0.03 10*3/uL (ref 0.00–0.07)
Basophils Absolute: 0.1 10*3/uL (ref 0.0–0.1)
Basophils Relative: 1 %
Eosinophils Absolute: 0.7 10*3/uL — ABNORMAL HIGH (ref 0.0–0.5)
Eosinophils Relative: 7 %
HCT: 49.5 % (ref 39.0–52.0)
Hemoglobin: 16.9 g/dL (ref 13.0–17.0)
Immature Granulocytes: 0 %
Lymphocytes Relative: 12 %
Lymphs Abs: 1.4 10*3/uL (ref 0.7–4.0)
MCH: 32.5 pg (ref 26.0–34.0)
MCHC: 34.1 g/dL (ref 30.0–36.0)
MCV: 95.2 fL (ref 80.0–100.0)
Monocytes Absolute: 0.9 10*3/uL (ref 0.1–1.0)
Monocytes Relative: 8 %
Neutro Abs: 8.3 10*3/uL — ABNORMAL HIGH (ref 1.7–7.7)
Neutrophils Relative %: 72 %
Platelets: 195 10*3/uL (ref 150–400)
RBC: 5.2 MIL/uL (ref 4.22–5.81)
RDW: 14.3 % (ref 11.5–15.5)
WBC: 11.4 10*3/uL — ABNORMAL HIGH (ref 4.0–10.5)
nRBC: 0 % (ref 0.0–0.2)

## 2022-02-03 LAB — RESP PANEL BY RT-PCR (FLU A&B, COVID) ARPGX2
Influenza A by PCR: NEGATIVE
Influenza B by PCR: NEGATIVE
SARS Coronavirus 2 by RT PCR: NEGATIVE

## 2022-02-03 LAB — COMPREHENSIVE METABOLIC PANEL
ALT: 52 U/L — ABNORMAL HIGH (ref 0–44)
AST: 27 U/L (ref 15–41)
Albumin: 3.9 g/dL (ref 3.5–5.0)
Alkaline Phosphatase: 73 U/L (ref 38–126)
Anion gap: 11 (ref 5–15)
BUN: 23 mg/dL (ref 8–23)
CO2: 27 mmol/L (ref 22–32)
Calcium: 8.4 mg/dL — ABNORMAL LOW (ref 8.9–10.3)
Chloride: 99 mmol/L (ref 98–111)
Creatinine, Ser: 1.26 mg/dL — ABNORMAL HIGH (ref 0.61–1.24)
GFR, Estimated: 58 mL/min — ABNORMAL LOW (ref >60)
Glucose, Bld: 203 mg/dL — ABNORMAL HIGH (ref 70–99)
Potassium: 3 mmol/L — ABNORMAL LOW (ref 3.5–5.1)
Sodium: 137 mmol/L (ref 135–145)
Total Bilirubin: 1.6 mg/dL — ABNORMAL HIGH (ref 0.3–1.2)
Total Protein: 7.3 g/dL (ref 6.5–8.1)

## 2022-02-03 MED ORDER — PREDNISONE 20 MG PO TABS: 40.0000 mg | ORAL_TABLET | Freq: Every day | ORAL | 0 refills | Status: AC

## 2022-02-03 MED ORDER — IPRATROPIUM-ALBUTEROL 0.5-2.5 (3) MG/3ML IN SOLN
3.0000 mL | Freq: Once | RESPIRATORY_TRACT | Status: AC
  Administered 2022-02-03: 3 mL via RESPIRATORY_TRACT
  Filled 2022-02-03: qty 3

## 2022-02-03 MED ORDER — PREDNISONE 50 MG PO TABS
60.0000 mg | ORAL_TABLET | Freq: Once | ORAL | Status: AC
  Administered 2022-02-03: 60 mg via ORAL
  Filled 2022-02-03: qty 1

## 2022-02-03 NOTE — Discharge Instructions (Addendum)
You were seen in the emergency department today for palpitations and shortness of breath. ? ?As we discussed your lab work and imaging looked reassuring today.  I think that your symptoms are likely related to an exacerbation of your COPD.   ? ?I am reassured that your lungs sounded better after breathing treatments.  We have also given you a dose of steroids, and I'm sending you home on a short course of steroids as well. Know that steroids can affect your blood sugar so don't be surprised if your readings are a little high for a few days.  ? ?Continue to monitor how you're doing and return to the ER for new or worsening symptoms.  ?

## 2022-02-03 NOTE — ED Triage Notes (Signed)
Pt states he has been having an irregular HR today according to reading from pulse ox at home.  Also c/o of shortness of breath that has been going on a while.  ?

## 2022-02-03 NOTE — ED Provider Notes (Signed)
?[facility] ?Provider Note ? ? ?CSN: [id number] ?Arrival date & time: 02/03/22  1807 ? ?  ? ?History ? ?Chief Complaint  ?Patient presents with  ? Palpitations  ? ? ?Vincent Cooper is a 81 y.o. male who presents emergency department complaining of irregular heart beat today according to his pulse oximeter at home, as well as shortness of breath for several weeks.  Patient states that he woke up not feeling particularly well.  When his daughter came over and they were checking his heart rate, she reports that it was running really high, and then really low, and did not stay consistent.  He states for the past 2 to 3 weeks or so he has had a productive cough, and felt more short of breath.  He reports both shortness of breath at rest, worse with exertion.  When he feels very short of breath he is also experiencing some chest tightness, none at rest.  He does have a history of permanent atrial fibrillation, is on Eliquis.  He reports no missed doses. ? ?The history is provided by the patient and a relative.  ?Palpitations ?Associated symptoms: cough and shortness of breath   ?Associated symptoms: no nausea, no vomiting and no weakness   ? ?  ? ?Home Medications ?Prior to Admission medications   ?Medication Sig Start Date End Date Taking? Authorizing Provider  ?predniSONE (DELTASONE) 20 MG tablet Take 2 tablets (40 mg total) by mouth daily for 5 days. 02/03/22 02/08/22 Yes Parrish Daddario T, PA-C  ?albuterol (VENTOLIN HFA) 108 (90 Base) MCG/ACT inhaler Inhale 1 puff into the lungs every 6 (six) hours as needed for wheezing or shortness of breath.    [provider]  ?apixaban (ELIQUIS) 5 MG TABS tablet Take 1 tablet (5 mg total) by mouth 2 (two) times daily. 11/09/20 12/09/20  Leonie Man, MD  ?betamethasone dipropionate 0.05 % cream Apply topically 2 (two) times daily. 01/22/21   [provider]  ?Blood Glucose Monitoring Suppl (ACCU-CHEK GUIDE) w/Device KIT USE TO TEST DAILY  ASEDIRECTD.R 11/14/20   [provider]  ?diltiazem (CARDIZEM CD) 180 MG 24 hr capsule Take 360 mg by mouth daily. 01/22/21   [provider]  ?guaiFENesin (MUCINEX) 600 MG 12 hr tablet Take 1 tablet (600 mg total) by mouth 2 (two) times daily. 01/21/20   Barton Dubois, MD  ?ibuprofen (ADVIL) 200 MG tablet Take 400 mg by mouth every 6 (six) hours as needed for moderate pain.    [provider]  ?ipratropium-albuterol (DUONEB) 0.5-2.5 (3) MG/3ML SOLN Take 3 mLs by nebulization every 6 (six) hours as needed (shortness of breath).  09/09/20   [provider]  ?metoprolol tartrate (LOPRESSOR) 25 MG tablet Take 1 tablet (25 mg total) by mouth 2 (two) times daily. 06/29/20   Roxan Hockey, MD  ?nitroGLYCERIN (NITROSTAT) 0.4 MG SL tablet Place 1 tablet (0.4 mg total) under the tongue every 5 (five) minutes as needed for chest pain. 01/19/20 06/21/21  Leonie Man, MD  ?potassium chloride SA (KLOR-CON) 20 MEQ tablet Take 20 mEq by mouth daily. 07/14/20   [provider]  ?rosuvastatin (CRESTOR) 40 MG tablet TAKE 1 TABLET BY MOUTH ONCE DAILY. 07/18/21   Leonie Man, MD  ?tamsulosin (FLOMAX) 0.4 MG CAPS capsule Take 1 capsule (0.4 mg total) by mouth daily. ?Patient taking differently: Take 0.4 mg by mouth at bedtime. 06/30/20   Roxan Hockey, MD  ?torsemide (DEMADEX) 20 MG tablet Take 1 tablet (20 mg  total) by mouth 2 (two) times daily. May take an extra 20 mg if needed for increase swelling 11/09/20   Leonie Man, MD  ?Viviana Simpler ELLIPTA 200-62.5-25 MCG/INH AEPB Inhale 1 puff into the lungs daily.  05/12/20   [provider]  ?triamcinolone cream (KENALOG) 0.1 % Apply 1 application topically 2 (two) times daily.  06/13/20   [provider]  ?   ? ?Allergies    ?Other   ? ?Review of Systems   ?Review of Systems  ?Constitutional:  Negative for chills and fever.  ?Eyes:  Negative for visual disturbance.  ?Respiratory:  Positive for cough, chest tightness,  shortness of breath and wheezing.   ?Cardiovascular:  Positive for palpitations. Negative for leg swelling.  ?Gastrointestinal:  Negative for abdominal pain, nausea and vomiting.  ?Neurological:  Negative for syncope, weakness, light-headedness and headaches.  ?All other systems reviewed and are negative. ? ?Physical Exam ?Updated Vital Signs ?BP 136/87   Pulse 80   Temp 98.2 ?F (36.8 ?C) (Oral)   Resp 14   Ht _0  (1.753 m)   Wt 117.9 kg   SpO2 93%   BMI 38.40 kg/m?  ?Physical Exam ?Vitals and nursing note reviewed.  ?Constitutional:   ?   Appearance: Normal appearance.  ?HENT:  ?   Head: Normocephalic and atraumatic.  ?Eyes:  ?   Conjunctiva/sclera: Conjunctivae normal.  ?Cardiovascular:  ?   Rate and Rhythm: Normal rate and regular rhythm.  ?   Pulses:     ?     Radial pulses are 1+ on the right side and 1+ on the left side.  ?Pulmonary:  ?   Effort: Pulmonary effort is normal. No respiratory distress.  ?   Comments: Expiratory wheezing in all lung fields, no consolidation ?Abdominal:  ?   General: There is no distension.  ?   Palpations: Abdomen is soft.  ?   Tenderness: There is no abdominal tenderness.  ?Skin: ?   General: Skin is warm and dry.  ?Neurological:  ?   General: No focal deficit present.  ?   Mental Status: He is alert.  ? ? ?ED Results / Procedures / Treatments   ?Labs ?(all labs ordered are listed, but only abnormal results are displayed) ?Labs Reviewed  ?CBC WITH DIFFERENTIAL/PLATELET - Abnormal; Notable for the following components:  ?    Result Value  ? WBC 11.4 (*)   ? Neutro Abs 8.3 (*)   ? Eosinophils Absolute 0.7 (*)   ? All other components within normal limits  ?COMPREHENSIVE METABOLIC PANEL - Abnormal; Notable for the following components:  ? Potassium 3.0 (*)   ? Glucose, Bld 203 (*)   ? Creatinine, Ser 1.26 (*)   ? Calcium 8.4 (*)   ? ALT 52 (*)   ? Total Bilirubin 1.6 (*)   ? GFR, Estimated 58 (*)   ? All other components within normal limits  ?RESP PANEL BY RT-PCR (FLU A&B,  COVID) [id number]  ?TROPONIN I (HIGH SENSITIVITY)  ? ? ?EKG ?EKG Interpretation ? ?Date/Time:  Sunday February 03 2022 18:31:54 EDT ?Ventricular Rate:  72 ?PR Interval:    ?QRS Duration: 101 ?QT Interval:  395 ?QTC Calculation: 433 ?R Axis:   71 ?Text Interpretation: Atrial fibrillation Low voltage, precordial leads Nonspecific T abnormalities, lateral leads Since last tracing rate slower Confirmed by Noemi Chapel [id number]) on 02/03/2022 6:36:38 PM ? ?Radiology ?DG Chest 2 View ? ?Result Date: 02/03/2022 ?CLINICAL DATA:  Acute shortness of breath and  irregular heart rate. EXAM: CHEST - 2 VIEW COMPARISON:  10/19/2020 and prior radiographs FINDINGS: Cardiomegaly again noted. There is no evidence of focal airspace disease, pulmonary edema, suspicious pulmonary nodule/mass, pleural effusion, or pneumothorax. No acute bony abnormalities are identified. IMPRESSION: Cardiomegaly without evidence of acute cardiopulmonary disease. Electronically Signed   By: Margarette Canada M.D.   On: 02/03/2022 18:57   ? ?Procedures ?Procedures  ? ? ?Medications Ordered in ED ?Medications  ?ipratropium-albuterol (DUONEB) 0.5-2.5 (3) MG/3ML nebulizer solution 3 mL (has no administration in time range)  ?ipratropium-albuterol (DUONEB) 0.5-2.5 (3) MG/3ML nebulizer solution 3 mL (3 mLs Nebulization Given 02/03/22 2105)  ?predniSONE (DELTASONE) tablet 60 mg (60 mg Oral Given 02/03/22 2055)  ? ? ?ED Course/ Medical Decision Making/ A&P ?  ?                        ?Medical Decision Making ?Amount and/or Complexity of Data Reviewed ?Labs: ordered. ?Radiology: ordered. ? ? ?This patient presents to the ED for concern of palpitations, this involves an extensive number of treatment options, and is a complaint that carries with it a high risk of complications and morbidity. The emergent differential diagnosis prior to evaluation includes, but is not limited to,  Cardiac arrhythmias, PVC/PAC, ACS, cardiomyopathy, CHF, pericarditis, valvular disease, panic/anxiety,  ETOH, caffeine/stimulant use, medication side effect, anemia, hyperthyroidism, pulmonary embolism. ? ?This is not an exhaustive differential.  ? ?Past Medical History / Co-morbidities / Social History: ?COPD, hypertension, O

## 2022-02-05 DIAGNOSIS — I4821 Permanent atrial fibrillation: Secondary | ICD-10-CM | POA: Diagnosis not present

## 2022-02-05 DIAGNOSIS — J441 Chronic obstructive pulmonary disease with (acute) exacerbation: Secondary | ICD-10-CM | POA: Diagnosis not present

## 2022-02-05 DIAGNOSIS — Z6839 Body mass index (BMI) 39.0-39.9, adult: Secondary | ICD-10-CM | POA: Diagnosis not present

## 2022-02-05 DIAGNOSIS — I4891 Unspecified atrial fibrillation: Secondary | ICD-10-CM | POA: Diagnosis not present

## 2022-02-13 DIAGNOSIS — J441 Chronic obstructive pulmonary disease with (acute) exacerbation: Secondary | ICD-10-CM | POA: Diagnosis not present

## 2022-02-13 DIAGNOSIS — I10 Essential (primary) hypertension: Secondary | ICD-10-CM | POA: Diagnosis not present

## 2022-02-13 DIAGNOSIS — Z6839 Body mass index (BMI) 39.0-39.9, adult: Secondary | ICD-10-CM | POA: Diagnosis not present

## 2022-02-15 ENCOUNTER — Other Ambulatory Visit: Payer: Self-pay | Admitting: Cardiology

## 2022-02-18 ENCOUNTER — Other Ambulatory Visit: Payer: Self-pay | Admitting: Cardiology

## 2022-02-27 DIAGNOSIS — Z6838 Body mass index (BMI) 38.0-38.9, adult: Secondary | ICD-10-CM | POA: Diagnosis not present

## 2022-02-27 DIAGNOSIS — I10 Essential (primary) hypertension: Secondary | ICD-10-CM | POA: Diagnosis not present

## 2022-02-27 DIAGNOSIS — J441 Chronic obstructive pulmonary disease with (acute) exacerbation: Secondary | ICD-10-CM | POA: Diagnosis not present

## 2022-03-27 DIAGNOSIS — J449 Chronic obstructive pulmonary disease, unspecified: Secondary | ICD-10-CM | POA: Diagnosis not present

## 2022-03-27 DIAGNOSIS — Z6838 Body mass index (BMI) 38.0-38.9, adult: Secondary | ICD-10-CM | POA: Diagnosis not present

## 2022-03-27 DIAGNOSIS — J441 Chronic obstructive pulmonary disease with (acute) exacerbation: Secondary | ICD-10-CM | POA: Diagnosis not present

## 2022-04-12 ENCOUNTER — Ambulatory Visit (INDEPENDENT_AMBULATORY_CARE_PROVIDER_SITE_OTHER): Payer: Medicare HMO | Admitting: Student

## 2022-04-12 ENCOUNTER — Encounter: Payer: Self-pay | Admitting: Student

## 2022-04-12 VITALS — BP 120/62 | HR 89 | Temp 98.2°F | Ht 69.0 in | Wt 264.0 lb

## 2022-04-12 DIAGNOSIS — Z9989 Dependence on other enabling machines and devices: Secondary | ICD-10-CM | POA: Diagnosis not present

## 2022-04-12 DIAGNOSIS — J45909 Unspecified asthma, uncomplicated: Secondary | ICD-10-CM | POA: Diagnosis not present

## 2022-04-12 DIAGNOSIS — J432 Centrilobular emphysema: Secondary | ICD-10-CM | POA: Diagnosis not present

## 2022-04-12 DIAGNOSIS — J449 Chronic obstructive pulmonary disease, unspecified: Secondary | ICD-10-CM

## 2022-04-12 DIAGNOSIS — J8283 Eosinophilic asthma: Secondary | ICD-10-CM

## 2022-04-12 DIAGNOSIS — G4733 Obstructive sleep apnea (adult) (pediatric): Secondary | ICD-10-CM | POA: Diagnosis not present

## 2022-04-12 DIAGNOSIS — R0609 Other forms of dyspnea: Secondary | ICD-10-CM

## 2022-04-12 MED ORDER — BREZTRI AEROSPHERE 160-9-4.8 MCG/ACT IN AERO
2.0000 | INHALATION_SPRAY | Freq: Two times a day (BID) | RESPIRATORY_TRACT | 0 refills | Status: DC
Start: 2022-04-12 — End: 2022-05-02

## 2022-04-12 MED ORDER — AEROCHAMBER MV MISC: 0 refills | Status: AC

## 2022-04-12 MED ORDER — PREDNISONE 5 MG PO TABS: 10.0000 mg | ORAL_TABLET | Freq: Every day | ORAL | 2 refills | Status: DC

## 2022-04-12 MED ORDER — BREZTRI AEROSPHERE 160-9-4.8 MCG/ACT IN AERO
2.0000 | INHALATION_SPRAY | Freq: Two times a day (BID) | RESPIRATORY_TRACT | 11 refills | Status: DC
Start: 2022-04-12 — End: 2023-01-30

## 2022-04-12 NOTE — Patient Instructions (Addendum)
-   Referral made to pulmonary rehab, PFTs (breathing tests) ordered for next visit, you will be called to schedule - STOP taking trelegy - START taking breztri 2 puffs twice daily with spacer, rinse mouth and spacer out after use - albuterol inhaler as needed - prednisone 10 mg daily until next visit refilled - we will work on getting you set up for Nucala (mepolizumab) to hopefully decrease your need for prednisone - see you in 6 weeks or sooner if need be!

## 2022-04-12 NOTE — Progress Notes (Signed)
[facility]  Synopsis: Referred for COPD by [name], MD  Subjective:   PATIENT ID: Vincent Cooper GENDER: male DOB: Jan 03, 1941, MRN: [id number]  Chief Complaint  Patient presents with   Pulmonary Consult    Referred by Dr. Judd Lien for eval of COPD. He c/o increased SOB for the past 2 months. Has wheezing occ. He has prod cough with white sputum in the am. Has to sleep in a recliner due to SOB- on CPAP.    81yM with history of COPD on trelegy 200 and systemic steroids and used to see pulmonologist in [place], CAD, DM2, OSA on CPAP, HTN, PE 10/2019 - he is unsure if he still taking eliquis due to this PE  He has DOE to 100 feet. Worsening gradually overall but can have episodic worsening. He does have a productive cough typically in the morning. No CP. Has been on trelegy for a couple years.   He has been on systemic steroids for 2 months. Down to 10 mg daily.   He has never tried pulmonary rehab before  Not smoking currently, quit 20 ya. 50 py+ smoking.   Uses CPAP every night. But does have trouble breathing if he lies down flat. Probably excessively sleepy during the day.   Never had asthma as a kid, does frequently have sinus congestion but no history of sinus surgeries.   Otherwise pertinent review of systems is negative.  He father had emphysema  He worked in home improvement. He has lived in [place] and [place]. No MJ or vaping.     Past Medical History:  Diagnosis Date   Chronic gout    COPD (chronic obstructive pulmonary disease) ([place])    Oxygen dependent   Coronary artery disease due to calcified coronary lesion 03/08/2020   CORONARY CTA: Cor Ca++ Score 1651 !!: ~ 50% LM (CTFFR 1 - NS). Large Dom RCA-<PDA-PAV/PL - diffuse mild-mod plaque: prox (25-49%), mid (50-69%) CTFFR (p 0.99, m 0.85, d 0.81 - NS).  Med-Size LAD - prox-mid long diffuse Mod-Severe Ca++ plaque (~50-69%, ?>70%): CTFFR p 0.95, m 0.88, d 0.86 -NS.  CTFFR LCx 0.94, OM1 0.90 - NS. (NS=Not Significant).    Diabetes mellitus type II, non insulin dependent (HCC)    Hypertension    Morbid obesity (HCC)    BMI of 38.5 with multiple risk factors.   OSA on CPAP    Pulmonary emboli ([place]) 10/2019   Chest CTA-4 [place]ke opacification of main PA but there is partially occlusive main posterior RLL and segmental/segmental branches.  Small thrombus noted in the anterior right middle lobe.  No RV strain.  Scattered aortic atherosclerosis involving great vessels.  Coronary calcification noted.   RLS (restless legs syndrome)      Family History  Problem Relation Age of Onset   Colon cancer Mother    Emphysema Father        smoked   Diverticulitis Sister    Diabetes Brother    Diabetes Brother    Heart Problems Maternal Grandmother    Breast cancer Daughter    Lupus Son    CAD Neg Hx      Past Surgical History:  Procedure Laterality Date   BOWEL RESECTION N/A 10/18/2020   Procedure: SMALL BOWEL RESECTION;  Surgeon: Virl Cagey, MD;  Location: [facility];  Service: General;  Laterality: N/A;   Cataract surgery Right    COLONOSCOPY WITH PROPOFOL N/A 09/27/2020   Procedure: COLONOSCOPY WITH PROPOFOL;  Surgeon: Rogene Houston, MD;  Location:  [facility];  Service: Endoscopy;  Laterality: N/A;  1:15   COLOSTOMY REVERSAL N/A 10/18/2020   Procedure: COLOSTOMY REVERSAL;  Surgeon: Virl Cagey, MD;  Location: [facility];  Service: General;  Laterality: N/A;   LAPAROTOMY N/A 06/21/2020   Procedure: EXPLORATORY LAPAROTOMY,  bowel resection, creation ostomy;  Surgeon: Virl Cagey, MD;  Location: [facility];  Service: General;  Laterality: N/A;   POLYPECTOMY  09/27/2020   Procedure: POLYPECTOMY;  Surgeon: Rogene Houston, MD;  Location: [facility];  Service: Endoscopy;;   TRANSTHORACIC ECHOCARDIOGRAM  10/20/2020   EF 60 to 65%.  Moderate LVH at least GR 1 DD.  Mild to moderate LA dilation.  Moderate aortic valve calcification-sclerosis with no stenosis.  Normal RAP.    Social History    Socioeconomic History   Marital status: Legally Separated    Spouse name: Not on file   Number of children: 3   Years of education: Not on file   Highest education level: Not on file  Occupational History   Occupation: retired  Tobacco Use   Smoking status: Former    Packs/day: 2.00    Years: 55.00    Pack years: 110.00    Types: Cigarettes    Quit date: 11/18/2001    Years since quitting: 20.4   Smokeless tobacco: Former  Scientific laboratory technician Use: Never used  Substance and Sexual Activity   Alcohol use: Not Currently   Drug use: Never   Sexual activity: Not on file  Other Topics Concern   Not on file  Social History Narrative   Not on file   Social Determinants of Health   Financial Resource Strain: Not on file  Food Insecurity: Not on file  Transportation Needs: Not on file  Physical Activity: Not on file  Stress: Not on file  Social Connections: Not on file  Intimate Partner Violence: Not on file     Allergies  Allergen Reactions   Other     Patient reports he was allergic to something in an IV he was given but does not know what the substance was. As of 01/19/2020      Outpatient Medications Prior to Visit  Medication Sig Dispense Refill   albuterol (VENTOLIN HFA) 108 (90 Base) MCG/ACT inhaler Inhale 1 puff into the lungs every 6 (six) hours as needed for wheezing or shortness of breath.     apixaban (ELIQUIS) 2.5 MG TABS tablet Take 2.5 mg by mouth 2 (two) times daily.     diltiazem (CARDIZEM CD) 180 MG 24 hr capsule Take 360 mg by mouth daily.     ipratropium-albuterol (DUONEB) 0.5-2.5 (3) MG/3ML SOLN Take 3 mLs by nebulization every 6 (six) hours as needed (shortness of breath).      metoprolol tartrate (LOPRESSOR) 25 MG tablet Take 1 tablet (25 mg total) by mouth 2 (two) times daily. 60 tablet 3   potassium chloride SA (KLOR-CON) 20 MEQ tablet Take 20 mEq by mouth daily.     predniSONE 5 MG TBEC Take 2 tablets by mouth daily.     rosuvastatin (CRESTOR) 40  MG tablet TAKE 1 TABLET BY MOUTH ONCE DAILY. 90 tablet 1   tamsulosin (FLOMAX) 0.4 MG CAPS capsule Take 1 capsule (0.4 mg total) by mouth daily. (Patient taking differently: Take 0.4 mg by mouth at bedtime.) 30 capsule 1   torsemide (DEMADEX) 20 MG tablet Take 1 tablet (20 mg total) by mouth 2 (two) times daily. May take an extra  20 mg if needed for increase swelling 200 tablet 3   TRELEGY ELLIPTA 200-62.5-25 MCG/INH AEPB Inhale 1 puff into the lungs daily.      apixaban (ELIQUIS) 5 MG TABS tablet Take 1 tablet (5 mg total) by mouth 2 (two) times daily. 60 tablet 11   nitroGLYCERIN (NITROSTAT) 0.4 MG SL tablet Place 1 tablet (0.4 mg total) under the tongue every 5 (five) minutes as needed for chest pain. 90 tablet 3   betamethasone dipropionate 0.05 % cream Apply topically 2 (two) times daily.     Blood Glucose Monitoring Suppl (ACCU-CHEK GUIDE) w/Device KIT USE TO TEST DAILY ASEDIRECTD.R     guaiFENesin (MUCINEX) 600 MG 12 hr tablet Take 1 tablet (600 mg total) by mouth 2 (two) times daily. 30 tablet 0   ibuprofen (ADVIL) 200 MG tablet Take 400 mg by mouth every 6 (six) hours as needed for moderate pain.     triamcinolone cream (KENALOG) 0.1 % Apply 1 application topically 2 (two) times daily.      No facility-administered medications prior to visit.       Objective:   Physical Exam:  General appearance: 81 y.o., male, NAD, conversant  Eyes: anicteric sclerae; PERRL, tracking appropriately HENT: NCAT; MMM Neck: Trachea midline; no lymphadenopathy, no JVD Lungs: expiratory wheeze bilaterally, with mildly increased respiratory effort CV: RRR, no murmur  Abdomen: Soft, non-tender; non-distended, BS present  Extremities: 1+ pitting BLE edema, warm Skin: Normal turgor and texture; no rash Psych: Appropriate affect Neuro: Alert and oriented to person and place, no focal deficit     Vitals:   04/12/22 1536  BP: 120/62  Pulse: 89  Temp: 98.2 F (36.8 C)  TempSrc: Oral  SpO2: 96%   Weight: 264 lb (119.7 kg)  Height: _0  (1.753 m)   96% on RA BMI Readings from Last 3 Encounters:  04/12/22 38.99 kg/m  02/03/22 38.40 kg/m  04/17/21 37.33 kg/m   Wt Readings from Last 3 Encounters:  04/12/22 264 lb (119.7 kg)  02/03/22 260 lb (117.9 kg)  04/17/21 252 lb 12.8 oz (114.7 kg)     CBC    Component Value Date/Time   WBC 11.4 (H) 02/03/2022 1820   RBC 5.20 02/03/2022 1820   HGB 16.9 02/03/2022 1820   HCT 49.5 02/03/2022 1820   PLT 195 02/03/2022 1820   MCV 95.2 02/03/2022 1820   MCH 32.5 02/03/2022 1820   MCHC 34.1 02/03/2022 1820   RDW 14.3 02/03/2022 1820   LYMPHSABS 1.4 02/03/2022 1820   MONOABS 0.9 02/03/2022 1820   EOSABS 0.7 (H) 02/03/2022 1820   BASOSABS 0.1 02/03/2022 1820    Remarkable eosinophilia historically  Chest Imaging: CTA Chest with a little upper lobe emphysema and bronchial wall thickening  Pulmonary Functions Testing Results:     View : No data to display.           Echocardiogram:   TTE 10/2020:  1. Left ventricular ejection fraction, by estimation, is 60 to 65%. The  left ventricle has normal function. The left ventricle has no regional  wall motion abnormalities. There is moderate left ventricular hypertrophy.  Left ventricular diastolic  parameters are indeterminate.   2. Right ventricular systolic function is normal. The right ventricular  size is normal.   3. Left atrial size was mild to moderately dilated.   4. The mitral valve is normal in structure. No evidence of mitral valve  regurgitation. No evidence of mitral stenosis.   5. The aortic valve has  an indeterminant number of cusps. There is  moderate calcification of the aortic valve. There is moderate thickening  of the aortic valve. Aortic valve regurgitation is not visualized. No  aortic stenosis is present.   6. The inferior vena cava is normal in size with greater than 50%  respiratory variability, suggesting right atrial pressure of 3 mmHg.       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 Still sounds tight today despite this.   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 Will work on taper at subsequent visit - request download of CPAP from [facility] next visit - see you in 6 weeks or sooner if need be!    Maryjane Hurter, MD [facility] 04/12/2022 3:48 PM

## 2022-04-13 LAB — BRAIN NATRIURETIC PEPTIDE: Brain Natriuretic Peptide: 65 pg/mL (ref <100)

## 2022-04-16 ENCOUNTER — Telehealth: Payer: Self-pay | Admitting: Pharmacist

## 2022-04-16 DIAGNOSIS — J449 Chronic obstructive pulmonary disease, unspecified: Secondary | ICD-10-CM

## 2022-04-16 LAB — BASIC METABOLIC PANEL
BUN: 17 mg/dL (ref 7–25)
CO2: 31 mmol/L (ref 20–32)
Calcium: 9.1 mg/dL (ref 8.6–10.3)
Chloride: 96 mmol/L — ABNORMAL LOW (ref 98–110)
Creat: 1.16 mg/dL (ref 0.70–1.22)
Glucose, Bld: 270 mg/dL — ABNORMAL HIGH (ref 65–99)
Potassium: 3.8 mmol/L (ref 3.5–5.3)
Sodium: 138 mmol/L (ref 135–146)

## 2022-04-16 LAB — IGE: IgE (Immunoglobulin E), Serum: 4091 kU/L — ABNORMAL HIGH (ref <=114)

## 2022-04-16 NOTE — Telephone Encounter (Signed)
Received new start paperwork for Nucala  Submitted a Prior Authorization request to [facility] for [place] via CoverMyMeds. Will update once we receive a response.  Key: [id number]  Patient assistance application was minimally completed and provider form was not signed or completed at all. Will place back in Dr. Glenetta Hew mailbox for signature. Patient portion placed in PAP pending info folder in pharmacy office.  Patient appears to have both Medicare and Medicaid so may not require pt assistance.   Knox Saliva, PharmD, MPH, BCPS, CPP Clinical Pharmacist (Rheumatology and Pulmonology)

## 2022-04-17 ENCOUNTER — Other Ambulatory Visit (HOSPITAL_COMMUNITY): Payer: Self-pay

## 2022-04-17 NOTE — Telephone Encounter (Signed)
Received notification from [facility] regarding a prior authorization for [place]. Authorization has been APPROVED from 04/16/22 to 11/17/22.   Per test claim, copay for 28 days supply is $0.00  Patient can fill through [facility]: [phone]   Authorization # [id number]

## 2022-04-18 NOTE — Telephone Encounter (Signed)
Called patient to schedule Nucala new start. Unable to leave VM as VM box is full. ATC patient's daughter [name] but VM is full. Will f/u  Knox Saliva, PharmD, MPH, BCPS, CPP Clinical Pharmacist (Rheumatology and Pulmonology)

## 2022-04-19 LAB — ASPERGILLUS IGE PANEL
A. Amstel/Glaucu Class Interp: 0
A. Flavus Class Interp: 0
A. Nidulans Class Interp: 0
Aspergillus amstel/glaucu IgE*: <0.35 kU/L (ref <0.35)
Aspergillus flavus IgE: <0.35 kU/L (ref <0.35)
Aspergillus fumigatus IgE: 0.26 kU/L (ref <0.35)
Aspergillus nidulans IgE: <0.35 kU/L (ref <0.35)
Aspergillus niger IgE: 0.21 kU/L (ref <0.35)
Aspergillus versicolor IgE: 0.23 kU/L (ref <0.35)

## 2022-04-22 ENCOUNTER — Other Ambulatory Visit (HOSPITAL_COMMUNITY): Payer: Self-pay

## 2022-04-22 MED ORDER — NUCALA 100 MG/ML ~~LOC~~ SOAJ
100.0000 mg | SUBCUTANEOUS | 0 refills | Status: DC
  Filled 2022-04-22: qty 1, 28d supply, fill #0

## 2022-04-22 NOTE — Telephone Encounter (Signed)
Delivery instructions have been updated in [facility], medication will be couriered to [facility] by 04/29/22.  Rx has been processed in [facility] and the patient has no copay at this time.

## 2022-04-22 NOTE — Telephone Encounter (Signed)
Patient scheduled for Nucala new start on 05/02/22.  Rx sent to [facility] to be couriered to clinic prior to new start visit. Routing to [place] for onboarding needs  Knox Saliva, PharmD, MPH, BCPS, CPP Clinical Pharmacist (Rheumatology and Pulmonology)

## 2022-04-26 ENCOUNTER — Other Ambulatory Visit (HOSPITAL_COMMUNITY): Payer: Self-pay

## 2022-04-29 ENCOUNTER — Other Ambulatory Visit (HOSPITAL_COMMUNITY): Payer: Self-pay

## 2022-05-02 ENCOUNTER — Other Ambulatory Visit (HOSPITAL_COMMUNITY): Payer: Self-pay

## 2022-05-02 ENCOUNTER — Ambulatory Visit (INDEPENDENT_AMBULATORY_CARE_PROVIDER_SITE_OTHER): Payer: Medicare HMO | Admitting: Pharmacist

## 2022-05-02 DIAGNOSIS — J449 Chronic obstructive pulmonary disease, unspecified: Secondary | ICD-10-CM

## 2022-05-02 DIAGNOSIS — Z7189 Other specified counseling: Secondary | ICD-10-CM

## 2022-05-02 MED ORDER — NUCALA 100 MG/ML ~~LOC~~ SOAJ
100.0000 mg | SUBCUTANEOUS | 1 refills | Status: DC
  Filled 2022-05-02: qty 3, 84d supply, fill #0
  Filled 2022-05-13: qty 1, 28d supply, fill #0
  Filled 2022-06-28: qty 1, 28d supply, fill #1
  Filled 2022-07-23: qty 1, 28d supply, fill #2
  Filled 2022-08-20: qty 1, 28d supply, fill #3
  Filled 2022-09-16: qty 1, 28d supply, fill #4
  Filled 2022-10-14: qty 1, 28d supply, fill #5

## 2022-05-02 NOTE — Patient Instructions (Addendum)
Your next Nucala dose is due on 05/30/22 and every 28 days thereafter  CONTINUE Breztri, albuterol, prednisone  Your prescription will be shipped from [facility] Their phone number is  [phone]. Please call to schedule shipment and confirm address. They will mail your medication to your home.  You will need to be seen by your provider in 3 to 4 months to assess how Nucala is working for you. Please ensure you have a follow-up appointment scheduled in September or October. Call our clinic if you need to make this appointment.  How to manage an injection site reaction: Remember the 5 C's: COUNTER - leave on the counter at least 30 minutes but up to overnight to bring medication to room temperature. This may help prevent stinging COLD - place something cold (like an ice gel pack or cold water bottle) on the injection site just before cleansing with alcohol. This may help reduce pain CLARITIN - use Claritin (generic name is loratadine) for the first two weeks of treatment or the day of, the day before, and the day after injecting. This will help to minimize injection site reactions CORTISONE CREAM - apply if injection site is irritated and itching CALL ME - if injection site reaction is bigger than the size of your fist, looks infected, blisters, or if you develop hives

## 2022-05-02 NOTE — Progress Notes (Signed)
HPI Patient presents today to [facility] to see pharmacy team for [facility] new start.  Past medical history includes asthma-COPD overlap, HLD, T2DM, h/o PE, HTN, CHF, and atrial fibrillation   Number of hospitalizations in past year: 1 Number of COPD/asthma exacerbations in past year: 4  Respiratory Medications Current regimen: Breztri, albuterol, prednisone Tried in past: Trelegy Patient reports no known adherence challenges  OBJECTIVE Allergies  Allergen Reactions   Other     Patient reports he was allergic to something in an IV he was given but does not know what the substance was. As of 01/19/2020     Outpatient Encounter Medications as of 05/02/2022  Medication Sig   albuterol (VENTOLIN HFA) 108 (90 Base) MCG/ACT inhaler Inhale 1 puff into the lungs every 6 (six) hours as needed for wheezing or shortness of breath.   apixaban (ELIQUIS) 2.5 MG TABS tablet Take 2.5 mg by mouth 2 (two) times daily.   apixaban (ELIQUIS) 5 MG TABS tablet Take 1 tablet (5 mg total) by mouth 2 (two) times daily.   Budeson-Glycopyrrol-Formoterol (BREZTRI AEROSPHERE) 160-9-4.8 MCG/ACT AERO Inhale 2 puffs into the lungs in the morning and at bedtime.   Budeson-Glycopyrrol-Formoterol (BREZTRI AEROSPHERE) 160-9-4.8 MCG/ACT AERO Inhale 2 puffs into the lungs in the morning and at bedtime.   diltiazem (CARDIZEM CD) 180 MG 24 hr capsule Take 360 mg by mouth daily.   ipratropium-albuterol (DUONEB) 0.5-2.5 (3) MG/3ML SOLN Take 3 mLs by nebulization every 6 (six) hours as needed (shortness of breath).    Mepolizumab (NUCALA) 100 MG/ML SOAJ Inject 1 mL (100 mg total) into the skin every 28 (twenty-eight) days. Courier to pulm: [address], [place] [address]. Appt on 05/02/22   metoprolol tartrate (LOPRESSOR) 25 MG tablet Take 1 tablet (25 mg total) by mouth 2 (two) times daily.   nitroGLYCERIN (NITROSTAT) 0.4 MG SL tablet Place 1 tablet (0.4 mg total) under the tongue every 5 (five) minutes as  needed for chest pain.   potassium chloride SA (KLOR-CON) 20 MEQ tablet Take 20 mEq by mouth daily.   predniSONE (DELTASONE) 5 MG tablet Take 2 tablets (10 mg total) by mouth daily with breakfast.   predniSONE 5 MG TBEC Take 2 tablets by mouth daily.   rosuvastatin (CRESTOR) 40 MG tablet TAKE 1 TABLET BY MOUTH ONCE DAILY.   Spacer/Aero-Holding Chambers (AEROCHAMBER MV) inhaler Use as instructed   tamsulosin (FLOMAX) 0.4 MG CAPS capsule Take 1 capsule (0.4 mg total) by mouth daily. (Patient taking differently: Take 0.4 mg by mouth at bedtime.)   torsemide (DEMADEX) 20 MG tablet Take 1 tablet (20 mg total) by mouth 2 (two) times daily. May take an extra 20 mg if needed for increase swelling   No facility-administered encounter medications on file as of 05/02/2022.     Immunization History  Administered Date(s) Administered   Influenza Inj Mdck Quad With Preservative 08/28/2018   Influenza,inj,quad, With Preservative 08/29/2015     PFTs     No data to display           Eosinophils Most recent blood eosinophil count was 700 cells/microL taken on 02/03/22.   IgE: 4091 on 04/12/22   Assessment   Biologics training for mepolizumab (Nucala)  Goals of therapy: Mechanism of Action: Not fully understood. It does act an interleukin-5 (IL-5) antagonist monoclonal antibody that reduces the production and survival of eosinophils by blocking the binding of IL-5 to the alpha chain of the receptor complex on the eosinophil  cell surface. Reviewed that Nucala is add-on medication and patient must continue maintenance inhaler regimen. Response to therapy: may take 3 months to 6 months to determine efficacy. Discussed that patients generally feel improvement sooner than 3 months.  Side effects: headache (19%), injection site reaction (7-15%), antibody development (6%), backache (5%), fatigue (5%)  Dose: 100 mg subcutaneously every 4 weeks  Administration/Storage:  Reviewed administration sites  of thigh or abdomen (at least 2-3 inches away from abdomen). Reviewed the upper arm is only appropriate if caregiver is administering injection  Do not shake the reconstituted solution as this could lead to product foaming or precipitation. Solution should be clear to opalescent and colorless to pale yellow or pale brown, essentially particle free. Small air bubbles, however, are expected and acceptable. If particulate matter remains in the solution or if the solution appears cloudy or milky, discard the solution.  Reviewed storage of medication in refrigerator. Reviewed that Nucala can be stored at room temperature in unopened carton for up to 7 days.  Access: Approval of Nucala through: insurance  Patient self-administered Nucala '100mg'$ /mL in right abdomen using patient supplies Nucala '100mg'$ /mL autoinjector pen NDC: 4235-3614-43 Lot: [id number] Expiration: 09/17/2024  Patient monitored for 30 minutes for adverse reaction.  Patient tolerated injection well. Injection site checked and slightly red but not swollen.  Medication Reconciliation  A drug regimen assessment was performed, including review of allergies, interactions, disease-state management, dosing and immunization history. Medications were reviewed with the patient, including name, instructions, indication, goals of therapy, potential side effects, importance of adherence, and safe use.  Drug interaction(s): None  Immunizations  Patient is indicated for the influenzae, pneumonia, and shingles vaccinations. Patient has received 0 COVID19 vaccines.   PLAN Continue Nucala '100mg'$  every 4 weeks. Next dose is due 05/30/22 and every 4 weeks thereafter. Rx sent to: [facility]: [phone] . Patient provided with pharmacy phone number and advised to call later this week to schedule shipment to home. Continue maintenance asthma regimen of: Breztri, albuterol, prednisone  All questions encouraged and answered.   Instructed patient to reach out with any further questions or concerns.  Thank you for allowing pharmacy to participate in this patient's care.  This appointment required 60 minutes of patient care (this includes precharting, chart review, review of results, face-to-face care, etc.).  Joseph Art, Pharm.D. PGY-1 Pharmacy Resident 05/02/2022 2:59 PM

## 2022-05-03 ENCOUNTER — Other Ambulatory Visit (HOSPITAL_COMMUNITY): Payer: Self-pay

## 2022-05-13 ENCOUNTER — Other Ambulatory Visit (HOSPITAL_COMMUNITY): Payer: Self-pay

## 2022-05-13 DIAGNOSIS — E782 Mixed hyperlipidemia: Secondary | ICD-10-CM | POA: Diagnosis not present

## 2022-05-13 DIAGNOSIS — E7849 Other hyperlipidemia: Secondary | ICD-10-CM | POA: Diagnosis not present

## 2022-05-13 DIAGNOSIS — N183 Chronic kidney disease, stage 3 unspecified: Secondary | ICD-10-CM | POA: Diagnosis not present

## 2022-05-13 DIAGNOSIS — E1122 Type 2 diabetes mellitus with diabetic chronic kidney disease: Secondary | ICD-10-CM | POA: Diagnosis not present

## 2022-05-13 DIAGNOSIS — I10 Essential (primary) hypertension: Secondary | ICD-10-CM | POA: Diagnosis not present

## 2022-05-14 ENCOUNTER — Other Ambulatory Visit (HOSPITAL_COMMUNITY): Payer: Self-pay

## 2022-05-23 DIAGNOSIS — I4821 Permanent atrial fibrillation: Secondary | ICD-10-CM | POA: Diagnosis not present

## 2022-05-23 DIAGNOSIS — E7849 Other hyperlipidemia: Secondary | ICD-10-CM | POA: Diagnosis not present

## 2022-05-23 DIAGNOSIS — Z86711 Personal history of pulmonary embolism: Secondary | ICD-10-CM | POA: Diagnosis not present

## 2022-05-23 DIAGNOSIS — N138 Other obstructive and reflux uropathy: Secondary | ICD-10-CM | POA: Diagnosis not present

## 2022-05-23 DIAGNOSIS — K5790 Diverticulosis of intestine, part unspecified, without perforation or abscess without bleeding: Secondary | ICD-10-CM | POA: Diagnosis not present

## 2022-05-23 DIAGNOSIS — I10 Essential (primary) hypertension: Secondary | ICD-10-CM | POA: Diagnosis not present

## 2022-05-23 DIAGNOSIS — R609 Edema, unspecified: Secondary | ICD-10-CM | POA: Diagnosis not present

## 2022-05-23 DIAGNOSIS — E1122 Type 2 diabetes mellitus with diabetic chronic kidney disease: Secondary | ICD-10-CM | POA: Diagnosis not present

## 2022-05-23 DIAGNOSIS — G4730 Sleep apnea, unspecified: Secondary | ICD-10-CM | POA: Diagnosis not present

## 2022-05-23 DIAGNOSIS — N183 Chronic kidney disease, stage 3 unspecified: Secondary | ICD-10-CM | POA: Diagnosis not present

## 2022-05-26 NOTE — Progress Notes (Signed)
Primary Care Provider: Manon Hilding, MD Cardiologist: Glenetta Hew, MD Electrophysiologist: None Pulmonologist: Leslye Peer, MD  Clinic Note: Chief Complaint  Patient presents with  . Follow-up    12 months.  . Atrial Fibrillation    Essentially permanent A-fib, does not notice being in rhythm.  . Congestive Heart Failure    Chronic HFpEF, mostly swelling related to venous stasis.  Marland Kitchen Hospitalization Follow-up    Recent COPD exacerbation, no CHF component.    ===================================  ASSESSMENT/PLAN   Problem List Items Addressed This Visit       Cardiology Problems   Permanent atrial fibrillation [facility]): CHA2DS2Vasc = 5. Eliquis - Primary (Chronic)    Permanent A-fib, rate controlled.  Relatively asymptomatic.  Remains on low-dose Lopressor along with high-dose diltiazem for rate control.  Has not required any additional dosing.  On Eliquis 2.5 mg twice daily-need to confirm with pharmacy but I think this may be the incorrect dose.-If so, we will contact him and change the dose going forward.  No signs of tachybradycardia syndrome.      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  Now on DOAC for both history of DVT and PE as well as A-fib.  No bleeding issues. Echocardiogram suggested moderately elevated PAP with only 40 million mercury.  Not likely consistent with CTEPH.  As he seems to be relatively stable and recovered from his COPD exacerbation, I think that we do not need to consider right heart cath at this time.  Plan: Continue Eliquis long-term, okay to hold 1 to 2 days preop for surgical procedures.  (High risk procedures such as neurologic procedures will be 3 days)      Chronic diastolic HF (heart failure) (HCC) (Chronic)    Probably baseline diastolic dysfunction from microvascular ischemia  and hypertensive heart disease.  At age 62 this is expected.  I do think most of his edema is probably related to right-sided disease from his obesity-OHS/OSA and COPD.  Not really having orthopnea or PND.  Recent COPD exacerbation and now is doing much better.  Relatively stable swelling with current dose of torsemide.  We did indicate that he can take an extra dose of PRN for weight gain more than 3 pounds or worsening edema.      Relevant Orders   EKG 12-Lead (Completed)   Coronary artery disease, non-occlusive (Chronic)   Relevant Orders   EKG 12-Lead (Completed)   Essential hypertension (Chronic)    Blood pressure looks great on low-dose metoprolol plus high-dose diltiazem.  He is also on standing dose of torsemide with pretty well controlled volume status.      Hyperlipidemia associated with type 2 diabetes mellitus (HCC) (Chronic)    Extensive coronary calcification, no notable obstructive disease..  With extent of CAD noted with coronary calcium, would recommend trying to get LDL less than 70, currently at 61.  Based on his advanced age, would not want any more aggressive than 40 mg rosuvastatin.  Thankfully, despite him being morbidly obese, his blood sugars have come down nicely and he is no longer on any standing doses for diabetes.  Unfortunately this does preclude the ability of using GLP-1 agonist which would help with weight loss.        Other   Cardiac murmur (Chronic)    Previously diagnosed with a murmur, but I barely hear  exam.  If anything is mild aortic sclerosis based on location.  Nothing noted on echo.      Morbid obesity ([place]) (Chronic)    Unfortunately, with being on prednisone for COPD exacerbation, he gained weight.  Needs to work on losing back.  Would like to try to see him weight, unfortunately exercise is somewhat limited by arthritis pains.  If and when it becomes available again for use for obesity, he would probably be a good candidate to consider  [place].       ===================================  HPI:    Vincent Cooper is a 81 y.o. male with a PMH below who presents today for annual follow-up, at the request of Sasser, Silvestre Moment, MD.  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  He was doing well.  Trying to stay as active as possible.  Usually has to stop walking to catch his breath, but limited more by arthritis pains and dyspnea.  Enjoys gardening as well as he is sit in his chair.  No chest pain or pressure at rest or exertion.  No PND, orthopnea with trivial edema.  (Edema extent changes based on what he eats.)  No sensation of rapid regular beats palpitations.  He does not have any sensation of being in A-fib. No changes made.  Recent Hospitalizations:  02/03/2022-ER visit for COPD exacerbation.  He presented with complaints of irregular heartbeat according to his pulse ox monitor.  He also noted progressively worsening dyspnea.  Heart rate was running on the low but not consistent.  Complained of a productive cough over the last few weeks.  Both worse with exertion. Treated with DuoNeb and prednisone. F/u with Pulm Med -- adjusted meds  Reviewed  CV studies:    The following studies were reviewed today: (if available, images/films reviewed: From Epic Chart or Care Everywhere) None:  Interval History:   Vincent Cooper today for annual follow-up overall doing pretty well from a cardiac standpoint.  He did have a setback back in March where he had a COPD exacerbation, tells me that he seems to have fully recovered from the ER visit from  a Respiratory standpoint = is scheduled for Pulm Rehab starting this month.  He exercises his hips and knees feel a lot better from ambulation  standpoint since been on prednisone therapy.  He is able to actually try to walk more. From a car standpoint he denies any symptoms of irregular heartbeats or palpitations.  He is somewhat oblivious to being in A-fib.  Has not noted any rapid heart rates. No PND, orthopnea and no chest pain or pressure with rest or exertion.  He is morbidly obese and deconditioned with COPD,  therefore does have some exertional dyspnea as well as baseline dyspnea but no change from baseline.  He says actually his breathing is better now with the new medications that he has been on.  He does have some mild end of day swelling with some redness in his feet by the end of the day.  It does go down when he puts his feet up.  Not associated with any orthopnea.  No claudication.  CV Review of Symptoms (Summary) Cardiovascular ROS: positive for - dyspnea on exertion, shortness of breath, and slowly improving since latest COPD exacerbation; long-standing end of day  leg swelling - controlled with torsemide  negative for - chest pain, irregular heartbeat, orthopnea, palpitations, paroxysmal nocturnal dyspnea, rapid heart rate, shortness of breath, or dizziness/wooziness, lightheadedness; syncope/near syncope, TIA/amaurosis fugax.  Claudication.  ; melena, hematochezia, hematuria; epistaxis.   REVIEWED OF SYSTEMS   Review of Systems  Constitutional:  Negative for malaise/fatigue (Energy level gradually improving after his March exacerbation.) and weight loss (Has gained a little bit of weight since being on steroids).  HENT:  Negative for congestion.   Respiratory:  Positive for cough and shortness of breath. Negative for sputum production and wheezing (Wheezing since late March).        Notably improved from March.  Actually feels better than he has in a while.  Cardiovascular:        Per HPI  Gastrointestinal:  Negative for blood in stool, heartburn and melena.  Genitourinary:  Negative for hematuria.  Musculoskeletal:   Positive for back pain and joint pain.       Back and hip pain as well as knee pain seems to be doing better while on prednisone.  Neurological:  Negative for dizziness, focal weakness, weakness and headaches.  Psychiatric/Behavioral:  Positive for memory loss. Negative for depression. The patient is not nervous/anxious and does not have insomnia.    I have reviewed and (if needed) personally updated the patient's problem list, medications, allergies, past medical and surgical history, social and family history.   PAST MEDICAL HISTORY   Past Medical History:  Diagnosis Date  . Chronic gout   . COPD (chronic obstructive pulmonary disease) ([place])    Oxygen dependent  . Coronary artery disease due to calcified coronary lesion 03/08/2020   CORONARY CTA: Cor Ca++ Score 1651 !!: ~ 50% LM (CTFFR 1 - NS). Large Dom RCA-<PDA-PAV/PL - diffuse mild-mod plaque: prox (25-49%), mid (50-69%) CTFFR (p 0.99, m 0.85, d 0.81 - NS).  Med-Size LAD - prox-mid long diffuse Mod-Severe Ca++ plaque (~50-69%, ?>70%): CTFFR p 0.95, m 0.88, d 0.86 -NS.  CTFFR LCx 0.94, OM1 0.90 - NS. (NS=Not Significant).  . Diabetes mellitus type II, non insulin dependent ([place])   . Hypertension   . Morbid obesity (HCC)    BMI of 38.5 with multiple risk factors.  . OSA on CPAP   . Pulmonary emboli ([place]) 10/2019   Chest CTA-4 Phadke opacification of main PA but there is partially occlusive main posterior RLL and segmental/segmental branches.  Small thrombus noted in the anterior right middle lobe.  No RV strain.  Scattered aortic atherosclerosis involving great vessels.  Coronary calcification noted.  Marland Kitchen RLS (restless legs syndrome)     PAST SURGICAL HISTORY   Past Surgical History:  Procedure Laterality Date  . BOWEL RESECTION N/A 10/18/2020   Procedure: SMALL BOWEL RESECTION;  Surgeon: Virl Cagey, MD;  Location: [facility] ORS;  Service: General;  Laterality: N/A;  . Cataract surgery Right   . COLONOSCOPY WITH PROPOFOL N/A  09/27/2020   Procedure: COLONOSCOPY WITH PROPOFOL;  Surgeon: Rogene [place], MD;  Location: [facility] END[facility] SUITE;  Service: Endoscopy;  Laterality: N/A;  1:15  . COLOSTOMY REVERSAL N/A 10/18/2020   Procedure: COLOSTOMY REVERSAL;  Surgeon: Virl Cagey, MD;  Location: [facility] ORS;  Service: General;  Laterality: N/A;  . LAPAROTOMY N/A 06/21/2020   Procedure: EXPLORATORY LAPAROTOMY,  bowel resection, creation ostomy;  Surgeon: Virl Cagey, MD;  Location: [facility] OR[facility];  Service: General;  Laterality: N/A;  . POLYPECTOMY  09/27/2020   Procedure: POLYPECTOMY;  Surgeon: Hildred Laser  U, MD;  Location: [facility];  Service: Endoscopy;;  . TRANSTHORACIC ECHOCARDIOGRAM  10/20/2020   EF 60 to 65%.  Moderate LVH at least GR 1 DD.  Mild to moderate LA dilation.  Moderate aortic valve calcification-sclerosis with no stenosis.  Normal RAP.    Immunization History  Administered Date(s) Administered  . Influenza Inj Mdck Quad With Preservative 08/28/2018  . Influenza,inj,quad, With Preservative 08/29/2015    MEDICATIONS/ALLERGIES   Current Meds  Medication Sig  . albuterol (VENTOLIN HFA) 108 (90 Base) MCG/ACT inhaler Inhale 1 puff into the lungs every 6 (six) hours as needed for wheezing or shortness of breath.  Marland Kitchen apixaban (ELIQUIS) 2.5 MG TABS tablet Take 2.5 mg by mouth 2 (two) times daily.  . Budeson-Glycopyrrol-Formoterol (BREZTRI AEROSPHERE) 160-9-4.8 MCG/ACT AERO Inhale 2 puffs into the lungs in the morning and at bedtime.  Marland Kitchen diltiazem (CARDIZEM CD) 180 MG 24 hr capsule Take 360 mg by mouth every morning.  Marland Kitchen ipratropium-albuterol (DUONEB) 0.5-2.5 (3) MG/3ML SOLN Take 3 mLs by nebulization every 6 (six) hours as needed (shortness of breath).   . Mepolizumab (NUCALA) 100 MG/ML SOAJ Inject 1 mL (100 mg total) into the skin every 28 (twenty-eight) days.  . metoprolol tartrate (LOPRESSOR) 25 MG tablet Take 1 tablet (25 mg total) by mouth 2 (two) times daily.  . potassium chloride SA (KLOR-CON) 20 MEQ  tablet Take 20 mEq by mouth daily.  . predniSONE (DELTASONE) 5 MG tablet Take 2 tablets (10 mg total) by mouth daily with breakfast.  . rosuvastatin (CRESTOR) 40 MG tablet TAKE 1 TABLET BY MOUTH ONCE DAILY. (Patient taking differently: Take 40 mg by mouth at bedtime.)  . Spacer/Aero-Holding Chambers (AEROCHAMBER MV) inhaler Use as instructed  . tamsulosin (FLOMAX) 0.4 MG CAPS capsule Take 1 capsule (0.4 mg total) by mouth daily. (Patient taking differently: Take 0.4 mg by mouth at bedtime.)  . torsemide (DEMADEX) 20 MG tablet Take 1 tablet (20 mg total) by mouth 2 (two) times daily. May take an extra 20 mg if needed for increase swelling (Patient taking differently: Take 20-40 mg by mouth See admin instructions. 40 mg in the morning, 20 mg in the evening)    Allergies  Allergen Reactions  . Other     Patient reports he was allergic to something in an IV he was given but does not know what the substance was. As of 01/19/2020     SOCIAL HISTORY/FAMILY HISTORY   Reviewed in Epic:  Pertinent findings:  Social History   Tobacco Use  . Smoking status: Former    Packs/day: 2.00    Years: 55.00    Total pack years: 110.00    Types: Cigarettes    Quit date: 11/18/2001    Years since quitting: 20.5  . Smokeless tobacco: Former  Media planner  . Vaping Use: Never used  Substance Use Topics  . Alcohol use: Not Currently  . Drug use: Never   Social History   Social History Narrative  . Not on file    OBJCTIVE -PE, EKG, labs   Wt Readings from Last 3 Encounters:  05/27/22 270 lb (122.5 kg)  04/12/22 264 lb (119.7 kg)  02/03/22 260 lb (117.9 kg)  - put on a little wgt while on prednisone.    Physical Exam: BP 128/76 (BP Location: Left Arm, Patient Position: Sitting, Cuff Size: Normal)   Pulse 67   Ht '5\' 9"'$  (1.753 m)   Wt 270 lb (122.5 kg)   BMI 39.87 kg/m  Physical Exam Vitals reviewed.  Constitutional:      General: He is not in acute distress.    Appearance: He is obese. He  is not ill-appearing (Well-groomed.) or toxic-appearing.     Comments: Significant truncal obesity.  HENT:     Head: Normocephalic and atraumatic.     Ears:     Comments: Hard of hearing Neck:     Vascular: No carotid bruit or JVD (Unable to assess).  Cardiovascular:     Rate and Rhythm: Normal rate. Rhythm irregularly irregular.     Chest Wall: PMI is not displaced.     Pulses: Decreased pulses (Difficult to palpate due to body habitus and trace edema.  Feet are warm).     Heart sounds: S1 normal and S2 normal. Heart sounds are distant. Murmur heard.     Harsh crescendo-decrescendo early systolic murmur is present with a grade of 1/6 at the upper right sternal border radiating to the neck.     No friction rub. No gallop.  Pulmonary:     Effort: Pulmonary effort is normal. No respiratory distress.     Breath sounds: Normal breath sounds. No wheezing, rhonchi or rales.     Comments: Distant breath sounds just because of body habitus. Chest:     Chest wall: No tenderness.  Abdominal:     Comments: Protuberant, obese abdomen.  Unable to assess HSM.  Soft nontender.  NABS.  Musculoskeletal:        General: Swelling present. Normal range of motion.     Cervical back: Normal range of motion and neck supple.     Right lower leg: Edema (1+) present.     Left lower leg: Edema (1+) present.  Skin:    General: Skin is warm.     Comments: Mild venous stasis changes-violaceous skin on both lower extremities.  Neurological:     General: No focal deficit present.     Mental Status: He is alert and oriented to person, place, and time.     Gait: Gait abnormal (Antalgic gait, walks with a cane.).   Adult ECG Report  Rate: 67 ;  Rhythm: atrial fibrillation and non-specific ST&T abormalities ;   Narrative Interpretation: stable  Recent Labs: Reviewed  05/13/2022: TC 176, TG 103, HDL 104, LDL 80; Cr 1.34, K+ 3.9 04/12/2022: BNP 65  Lab Results  Component Value Date   CREATININE 1.16  04/12/2022   BUN 17 04/12/2022   NA 138 04/12/2022   K 3.8 04/12/2022   CL 96 (L) 04/12/2022   CO2 31 04/12/2022      Latest Ref Rng & Units 02/03/2022    6:20 PM 10/22/2020    7:59 AM 10/21/2020    5:11 AM  CBC  WBC 4.0 - 10.5 K/uL 11.4  6.5  7.6   Hemoglobin 13.0 - 17.0 g/dL 16.9  10.2  11.2   Hematocrit 39.0 - 52.0 % 49.5  32.4  35.7   Platelets 150 - 400 K/uL 195  215  204     Lab Results  Component Value Date   HGBA1C 4.7 (L) 10/16/2020  02/09/2022: A1c 5.7  Lab Results  Component Value Date   TSH 4.978 (H) 10/20/2020    ==================================================  I spent a total of 26 minutes with the patient spent in direct patient consultation.  Additional time spent with chart review  / charting (studies, outside notes, etc): 16 min Total Time: 42 min  Current medicines are reviewed at length with the patient  today.  (+/- concerns) none  Notice: This dictation was prepared with Dragon dictation along with smart phrase technology. Any transcriptional errors that result from this process are unintentional and may not be corrected upon review.  Studies Ordered:   Orders Placed This Encounter  Procedures  . EKG 12-Lead   No orders of the defined types were placed in this encounter.   Patient Instructions / Medication Changes & Studies & Tests Ordered   Patient Instructions  Medication Instructions:   NO CHANGES  *If you need a refill on your cardiac medications before your next appointment, please call your pharmacy*   Lab Work: NOT NEEDED   Testing/Procedures: NOT NEEDED   Follow-Up: At [facility], you and your health needs are our priority.  As part of our continuing mission to provide you with exceptional heart care, we have created designated Provider Care Teams.  These Care Teams include your primary Cardiologist (physician) and Advanced Practice Providers (APPs -  Physician Assistants and Nurse Practitioners) who all work together  to provide you with the care you need, when you need it.  We recommend signing up for the patient portal called "MyChart".  Sign up information is provided on this After Visit Summary.  MyChart is used to connect with patients for Virtual Visits (Telemedicine).  Patients are able to view lab/test results, encounter notes, upcoming appointments, etc.  Non-urgent messages can be sent to your provider as well.   To learn more about what you can do with MyChart, go to [email or web address].    Your next appointment:   12 month(s)  The format for your next appointment:   In Person  Provider:   Glenetta Hew, MD      Important Information About Sugar           Glenetta Hew, M.D., M.S. Interventional Cardiologist   Pager # [phone] Phone # [phone] [address]. [place], [place] [address]   Thank you for choosing Heart[facility] at [facility]!!

## 2022-05-27 ENCOUNTER — Encounter: Payer: Self-pay | Admitting: Cardiology

## 2022-05-27 ENCOUNTER — Ambulatory Visit (INDEPENDENT_AMBULATORY_CARE_PROVIDER_SITE_OTHER): Payer: Medicare HMO | Admitting: Cardiology

## 2022-05-27 ENCOUNTER — Other Ambulatory Visit (HOSPITAL_COMMUNITY): Payer: Self-pay

## 2022-05-27 VITALS — BP 128/76 | HR 67 | Ht 69.0 in | Wt 270.0 lb

## 2022-05-27 DIAGNOSIS — R011 Cardiac murmur, unspecified: Secondary | ICD-10-CM | POA: Diagnosis not present

## 2022-05-27 DIAGNOSIS — E785 Hyperlipidemia, unspecified: Secondary | ICD-10-CM

## 2022-05-27 DIAGNOSIS — I4821 Permanent atrial fibrillation: Secondary | ICD-10-CM

## 2022-05-27 DIAGNOSIS — Z7901 Long term (current) use of anticoagulants: Secondary | ICD-10-CM

## 2022-05-27 DIAGNOSIS — I2510 Atherosclerotic heart disease of native coronary artery without angina pectoris: Secondary | ICD-10-CM

## 2022-05-27 DIAGNOSIS — I10 Essential (primary) hypertension: Secondary | ICD-10-CM

## 2022-05-27 DIAGNOSIS — I2699 Other pulmonary embolism without acute cor pulmonale: Secondary | ICD-10-CM | POA: Diagnosis not present

## 2022-05-27 DIAGNOSIS — I5032 Chronic diastolic (congestive) heart failure: Secondary | ICD-10-CM

## 2022-05-27 DIAGNOSIS — E1169 Type 2 diabetes mellitus with other specified complication: Secondary | ICD-10-CM

## 2022-05-27 NOTE — Patient Instructions (Signed)
Medication Instructions:   NO CHANGES  *If you need a refill on your cardiac medications before your next appointment, please call your pharmacy*   Lab Work: NOT NEEDED   Testing/Procedures: NOT NEEDED   Follow-Up: At [facility], you and your health needs are our priority.  As part of our continuing mission to provide you with exceptional heart care, we have created designated Provider Care Teams.  These Care Teams include your primary Cardiologist (physician) and Advanced Practice Providers (APPs -  Physician Assistants and Nurse Practitioners) who all work together to provide you with the care you need, when you need it.  We recommend signing up for the patient portal called "MyChart".  Sign up information is provided on this After Visit Summary.  MyChart is used to connect with patients for Virtual Visits (Telemedicine).  Patients are able to view lab/test results, encounter notes, upcoming appointments, etc.  Non-urgent messages can be sent to your provider as well.   To learn more about what you can do with MyChart, go to [email or web address].    Your next appointment:   12 month(s)  The format for your next appointment:   In Person  Provider:   Glenetta Hew, MD      Important Information About Sugar

## 2022-06-01 ENCOUNTER — Encounter: Payer: Self-pay | Admitting: Cardiology

## 2022-06-01 NOTE — Assessment & Plan Note (Signed)
Blood pressure looks great on low-dose metoprolol plus high-dose diltiazem.  He is also on standing dose of torsemide with pretty well controlled volume status.

## 2022-06-01 NOTE — Assessment & Plan Note (Addendum)
Permanent A-fib, rate controlled.  Relatively asymptomatic.  Remains on low-dose Lopressor along with high-dose diltiazem for rate control.  Has not required any additional dosing.  On Eliquis 2.5 mg twice daily-need to confirm with pharmacy but I think this may be the incorrect dose.-If so, we will contact him and change the dose going forward.  No signs of tachybradycardia syndrome.

## 2022-06-01 NOTE — Assessment & Plan Note (Signed)
History of multiple subsegmental PEs back in 2021.  Now on DOAC for both history of DVT and PE as well as A-fib.  No bleeding issues. Echocardiogram suggested moderately elevated PAP with only 40 million mercury.  Not likely consistent with CTEPH.  As he seems to be relatively stable and recovered from his COPD exacerbation, I think that we do not need to consider right heart cath at this time.  Plan: Continue Eliquis long-term, okay to hold 1 to 2 days preop for surgical procedures.  (High risk procedures such as neurologic procedures will be 3 days)

## 2022-06-01 NOTE — Assessment & Plan Note (Signed)
Probably baseline diastolic dysfunction from microvascular ischemia and hypertensive heart disease.  At age 81 this is expected.  I do think most of his edema is probably related to right-sided disease from his obesity-OHS/OSA and COPD.  Not really having orthopnea or PND.  Recent COPD exacerbation and now is doing much better.  Relatively stable swelling with current dose of torsemide.  We did indicate that he can take an extra dose of PRN for weight gain more than 3 pounds or worsening edema.

## 2022-06-01 NOTE — Assessment & Plan Note (Signed)
Unfortunately, with being on prednisone for COPD exacerbation, he gained weight.  Needs to work on losing back.  Would like to try to see him weight, unfortunately exercise is somewhat limited by arthritis pains.  If and when it becomes available again for use for obesity, he would probably be a good candidate to consider [place].

## 2022-06-01 NOTE — Assessment & Plan Note (Signed)
Previously diagnosed with a murmur, but I barely hear exam.  If anything is mild aortic sclerosis based on location.  Nothing noted on echo.

## 2022-06-01 NOTE — Assessment & Plan Note (Addendum)
Extensive coronary calcification, no notable obstructive disease..  With extent of CAD noted with coronary calcium, would recommend trying to get LDL less than 70, currently at 1.  Based on his advanced age, would not want any more aggressive than 40 mg rosuvastatin.  Thankfully, despite him being morbidly obese, his blood sugars have come down nicely and he is no longer on any standing doses for diabetes.  Unfortunately this does preclude the ability of using GLP-1 agonist which would help with weight loss.

## 2022-06-03 ENCOUNTER — Encounter (HOSPITAL_COMMUNITY)
Admission: RE | Admit: 2022-06-03 | Discharge: 2022-06-03 | Disposition: A | Discharge status: Home / Self Care | Payer: Medicare HMO | Source: Ambulatory Visit | Attending: Student | Admitting: Student

## 2022-06-03 ENCOUNTER — Encounter (HOSPITAL_COMMUNITY): Payer: Self-pay

## 2022-06-03 VITALS — BP 112/76 | HR 85 | Ht 69.0 in | Wt 269.0 lb

## 2022-06-03 DIAGNOSIS — J432 Centrilobular emphysema: Secondary | ICD-10-CM | POA: Insufficient documentation

## 2022-06-03 NOTE — Progress Notes (Signed)
Pulmonary Individual Treatment Plan  Patient Details  Name: Vincent Cooper MRN: [id number] Date of Birth: 09-25-1941 Referring Provider:   Flowsheet Row PULMONARY REHAB OTHER RESP ORIENTATION from 06/03/2022 in [facility]  Referring Provider Dr. Verlee Monte       Initial Encounter Date:  Flowsheet Row PULMONARY REHAB OTHER RESP ORIENTATION from 06/03/2022 in [facility]  Date 06/03/22       Visit Diagnosis: Centrilobular emphysema ([place])  Patient's Home Medications on Admission:   Current Outpatient Medications:    albuterol (VENTOLIN HFA) 108 (90 Base) MCG/ACT inhaler, Inhale 1 puff into the lungs every 6 (six) hours as needed for wheezing or shortness of breath., Disp: , Rfl:    apixaban (ELIQUIS) 2.5 MG TABS tablet, Take 2.5 mg by mouth 2 (two) times daily., Disp: , Rfl:    betamethasone dipropionate 0.05 % cream, Apply 1 Application topically daily as needed (irritation)., Disp: , Rfl:    Budeson-Glycopyrrol-Formoterol (BREZTRI AEROSPHERE) 160-9-4.8 MCG/ACT AERO, Inhale 2 puffs into the lungs in the morning and at bedtime., Disp: 10.7 g, Rfl: 11   diltiazem (CARDIZEM CD) 180 MG 24 hr capsule, Take 360 mg by mouth every morning., Disp: , Rfl:    docusate sodium (COLACE) 100 MG capsule, Take 100 mg by mouth daily., Disp: , Rfl:    ipratropium-albuterol (DUONEB) 0.5-2.5 (3) MG/3ML SOLN, Take 3 mLs by nebulization every 6 (six) hours as needed (shortness of breath). , Disp: , Rfl:    Mepolizumab (NUCALA) 100 MG/ML SOAJ, Inject 1 mL (100 mg total) into the skin every 28 (twenty-eight) days., Disp: 3 mL, Rfl: 1   metoprolol tartrate (LOPRESSOR) 25 MG tablet, Take 1 tablet (25 mg total) by mouth 2 (two) times daily., Disp: 60 tablet, Rfl: 3   nitroGLYCERIN (NITROSTAT) 0.4 MG SL tablet, Place 0.4 mg under the tongue every 5 (five) minutes as needed for chest pain., Disp: , Rfl:    potassium chloride SA (KLOR-CON) 20 MEQ tablet, Take 20 mEq by mouth  daily., Disp: , Rfl:    predniSONE (DELTASONE) 5 MG tablet, Take 2 tablets (10 mg total) by mouth daily with breakfast., Disp: 60 tablet, Rfl: 2   rosuvastatin (CRESTOR) 40 MG tablet, TAKE 1 TABLET BY MOUTH ONCE DAILY. (Patient taking differently: Take 40 mg by mouth at bedtime.), Disp: 90 tablet, Rfl: 1   tamsulosin (FLOMAX) 0.4 MG CAPS capsule, Take 1 capsule (0.4 mg total) by mouth daily. (Patient taking differently: Take 0.4 mg by mouth at bedtime.), Disp: 30 capsule, Rfl: 1   torsemide (DEMADEX) 20 MG tablet, Take 1 tablet (20 mg total) by mouth 2 (two) times daily. May take an extra 20 mg if needed for increase swelling (Patient taking differently: Take 20-40 mg by mouth See admin instructions. 40 mg in the morning, 20 mg in the evening), Disp: 200 tablet, Rfl: 3   Spacer/Aero-Holding Chambers (AEROCHAMBER MV) inhaler, Use as instructed, Disp: 1 each, Rfl: 0  Past Medical History: Past Medical History:  Diagnosis Date   Chronic gout    COPD (chronic obstructive pulmonary disease) ([place])    Oxygen dependent   Coronary artery disease due to calcified coronary lesion 03/08/2020   CORONARY CTA: Cor Ca++ Score 1651 !!: ~ 50% LM (CTFFR 1 - NS). Large Dom RCA-<PDA-PAV/PL - diffuse mild-mod plaque: prox (25-49%), mid (50-69%) CTFFR (p 0.99, m 0.85, d 0.81 - NS).  Med-Size LAD - prox-mid long diffuse Mod-Severe Ca++ plaque (~50-69%, ?>70%): CTFFR p 0.95, m 0.88, d 0.86 -NS.  CTFFR LCx 0.94, OM1 0.90 - NS. (NS=Not Significant).   Diabetes mellitus type II, non insulin dependent (HCC)    Hypertension    Morbid obesity (HCC)    BMI of 38.5 with multiple risk factors.   OSA on CPAP    Pulmonary emboli ([place]) 10/2019   Chest CTA-4 [place] opacification of main PA but there is partially occlusive main posterior RLL and segmental/segmental branches.  Small thrombus noted in the anterior right middle lobe.  No RV strain.  Scattered aortic atherosclerosis involving great vessels.  Coronary calcification noted.    RLS (restless legs syndrome)     Tobacco Use: Social History   Tobacco Use  Smoking Status Former   Packs/day: 2.00   Years: 55.00   Total pack years: 110.00   Types: Cigarettes   Quit date: 11/18/2001   Years since quitting: 20.5  Smokeless Tobacco Former    Labs: Review Flowsheet  More data exists      Latest Ref Rng & Units 03/20/2020 06/17/2020 06/22/2020 06/23/2020 10/16/2020  Labs for ITP Cardiac and Pulmonary Rehab  Cholestrol 100 - 199 mg/dL 264  - - - -  LDL (calc) 0 - 99 mg/dL 135  - - - -  HDL-C >39 mg/dL 99  - - - -  Trlycerides <150 mg/dL 176  - 151  - -  Hemoglobin A1c 4.8 - 5.6 % - 5.4  - - 4.7   PH, Arterial 7.350 - 7.450 - - 7.421  7.399  -  PCO2 arterial 32.0 - 48.0 mmHg - - 36.4  39.3  -  Bicarbonate 20.0 - 28.0 mmol/L - - - 24.1  -  Acid-base deficit 0.0 - 2.0 mmol/L - - 0.6  0.4  -  O2 Saturation % - - >100.0  97.4  -    Capillary Blood Glucose: Lab Results  Component Value Date   GLUCAP 143 (H) 10/23/2020   GLUCAP 115 (H) 10/23/2020   GLUCAP 113 (H) 10/23/2020   GLUCAP 120 (H) 10/22/2020   GLUCAP 142 (H) 10/22/2020     Pulmonary Assessment Scores:  Pulmonary Assessment Scores     Row Name 06/03/22 1243         ADL UCSD   ADL Phase Entry     SOB Score total 50     Rest 0     Walk 2     Stairs 4     Bath 3     Dress 3     Shop 2       CAT Score   CAT Score 8       mMRC Score   mMRC Score 4             UCSD: Self-administered rating of dyspnea associated with activities of daily living (ADLs) 6-point scale (0 = "not at all" to 5 = "maximal or unable to do because of breathlessness")  Scoring Scores range from 0 to 120.  Minimally important difference is 5 units  CAT: CAT can identify the health impairment of COPD patients and is better correlated with disease progression.  CAT has a scoring range of zero to 40. The CAT score is classified into four groups of low (less than 10), medium (10 - 20), high (21-30) and very high  (31-40) based on the impact level of disease on health status. A CAT score over 10 suggests significant symptoms.  A worsening CAT score could be explained by an exacerbation, poor medication adherence, poor inhaler technique, or  progression of COPD or comorbid conditions.  CAT MCID is 2 points  mMRC: mMRC (Modified Medical Research Council) Dyspnea Scale is used to assess the degree of baseline functional disability in patients of respiratory disease due to dyspnea. No minimal important difference is established. A decrease in score of 1 point or greater is considered a positive change.   Pulmonary Function Assessment:   Exercise Target Goals: Exercise Program Goal: Individual exercise prescription set using results from initial 6 min walk test and THRR while considering  patient's activity barriers and safety.   Exercise Prescription Goal: Initial exercise prescription builds to 30-45 minutes a day of aerobic activity, 2-3 days per week.  Home exercise guidelines will be given to patient during program as part of exercise prescription that the participant will acknowledge.  Activity Barriers & Risk Stratification:  Activity Barriers & Cardiac Risk Stratification - 06/03/22 1302       Activity Barriers & Cardiac Risk Stratification   Activity Barriers Deconditioning;Shortness of Breath;Assistive Device    Cardiac Risk Stratification High             6 Minute Walk:  6 Minute Walk     Row Name 06/03/22 1304         6 Minute Walk   Phase Initial     Distance 600 feet     Walk Time 6 minutes     # of Rest Breaks 1     MPH 1.13     METS 0.56     RPE 12     Perceived Dyspnea  11     VO2 Peak 1.99     Symptoms Yes (comment)     Comments One sitting break for 1 minute due to pt feeling weak and tired     Resting HR 85 bpm     Resting BP 112/76     Resting Oxygen Saturation  95 %     Exercise Oxygen Saturation  during 6 min walk 93 %     Max Ex. HR 126 bpm     Max Ex.  BP 120/70     2 Minute Post BP 110/68       Interval HR   1 Minute HR 124     2 Minute HR 124     3 Minute HR 120     4 Minute HR 128     5 Minute HR 120     6 Minute HR 126     2 Minute Post HR 97     Interval Heart Rate? Yes       Interval Oxygen   Interval Oxygen? Yes     Baseline Oxygen Saturation % 95 %     1 Minute Oxygen Saturation % 92 %     1 Minute Liters of Oxygen 0 L     2 Minute Oxygen Saturation % 93 %     2 Minute Liters of Oxygen 0 L     3 Minute Oxygen Saturation % 94 %     3 Minute Liters of Oxygen 0 L     4 Minute Oxygen Saturation % 95 %     4 Minute Liters of Oxygen 0 L     5 Minute Oxygen Saturation % 94 %     5 Minute Liters of Oxygen 0 L     6 Minute Oxygen Saturation % 93 %     6 Minute Liters of Oxygen 0 L     2  Minute Post Oxygen Saturation % 96 %     2 Minute Post Liters of Oxygen 0 L              Oxygen Initial Assessment:  Oxygen Initial Assessment - 06/03/22 1305       Home Oxygen   Home Oxygen Device None    Sleep Oxygen Prescription CPAP    Home Exercise Oxygen Prescription None    Home Resting Oxygen Prescription None    Compliance with Home Oxygen Use Yes      Initial 6 min Walk   Oxygen Used None      Program Oxygen Prescription   Program Oxygen Prescription None      Intervention   Short Term Goals To learn and understand importance of monitoring SPO2 with pulse oximeter and demonstrate accurate use of the pulse oximeter.;To learn and understand importance of maintaining oxygen saturations>88%;To learn and demonstrate proper pursed lip breathing techniques or other breathing techniques.     Long  Term Goals Verbalizes importance of monitoring SPO2 with pulse oximeter and return demonstration;Maintenance of O2 saturations>88%;Exhibits proper breathing techniques, such as pursed lip breathing or other method taught during program session;Compliance with respiratory medication             Oxygen  Re-Evaluation:   Oxygen Discharge (Final Oxygen Re-Evaluation):   Initial Exercise Prescription:  Initial Exercise Prescription - 06/03/22 1300       Date of Initial Exercise RX and Referring Provider   Date 06/03/22    Referring Provider Dr. Verlee Monte    Expected Discharge Date 10/08/22      NuStep   Level 1    SPM 60    Minutes 17      Arm Ergometer   Level 1    RPM 30    Minutes 22      Prescription Details   Frequency (times per week) 2    Duration Progress to 30 minutes of continuous aerobic without signs/symptoms of physical distress      Intensity   THRR 40-80% of Max Heartrate 56-112    Ratings of Perceived Exertion 11-13    Perceived Dyspnea 0-4      Resistance Training   Training Prescription Yes    Weight 3    Reps 10-15             Perform Capillary Blood Glucose checks as needed.  Exercise Prescription Changes:   Exercise Comments:   Exercise Goals and Review:   Exercise Goals Re-Evaluation :   Discharge Exercise Prescription (Final Exercise Prescription Changes):   Nutrition:  Target Goals: Understanding of nutrition guidelines, daily intake of sodium '1500mg'$ , cholesterol '200mg'$ , calories 30% from fat and 7% or less from saturated fats, daily to have 5 or more servings of fruits and vegetables.  Biometrics:  Pre Biometrics - 06/03/22 1308       Pre Biometrics   Height '5\' 9"'$  (1.753 m)    Weight 268 lb 15.4 oz (122 kg)    Waist Circumference 55 inches    Hip Circumference 56 inches    Waist to Hip Ratio 0.98 %    BMI (Calculated) 39.7    Triceps Skinfold 25 mm    % Body Fat 41.6 %    Grip Strength 23.3 kg    Flexibility 0 in    Single Leg Stand 0 seconds              Nutrition Therapy Plan and Nutrition Goals:  Nutrition Therapy &  Goals - 06/03/22 1245       Intervention Plan   Intervention Nutrition handout(s) given to patient.    Expected Outcomes Short Term Goal: Understand basic principles of dietary content,  such as calories, fat, sodium, cholesterol and nutrients.             Nutrition Assessments:  Nutrition Assessments - 06/03/22 1245       MEDFICTS Scores   Pre Score 21            MEDIFICTS Score Key: ?70 Need to make dietary changes  40-70 Heart Healthy Diet ? 40 Therapeutic Level Cholesterol Diet   Picture Your Plate Scores: <76 Unhealthy dietary pattern with much room for improvement. 41-50 Dietary pattern unlikely to meet recommendations for good health and room for improvement. 51-60 More healthful dietary pattern, with some room for improvement.  >60 Healthy dietary pattern, although there may be some specific behaviors that could be improved.    Nutrition Goals Re-Evaluation:   Nutrition Goals Discharge (Final Nutrition Goals Re-Evaluation):   Psychosocial: Target Goals: Acknowledge presence or absence of significant depression and/or stress, maximize coping skills, provide positive support system. Participant is able to verbalize types and ability to use techniques and skills needed for reducing stress and depression.  Initial Review & Psychosocial Screening:  Initial Psych Review & Screening - 06/03/22 1305       Initial Review   Current issues with None Identified      Family Dynamics   Good Support System? Yes    Comments His daughter is his main support system.      Barriers   Psychosocial barriers to participate in program There are no identifiable barriers or psychosocial needs.      Screening Interventions   Interventions Encouraged to exercise    Expected Outcomes Long Term goal: The participant improves quality of Life and PHQ9 Scores as seen by post scores and/or verbalization of changes;Short Term goal: Identification and review with participant of any Quality of Life or Depression concerns found by scoring the questionnaire.             Quality of Life Scores:  Quality of Life - 06/03/22 1309       Quality of Life   Select  Quality of Life      Quality of Life Scores   Health/Function Pre 25.72 %    Socioeconomic Pre 30 %    Psych/Spiritual Pre 30 %    Family Pre 30 %    GLOBAL Pre 27.92 %            Scores of 19 and below usually indicate a poorer quality of life in these areas.  A difference of  2-3 points is a clinically meaningful difference.  A difference of 2-3 points in the total score of the Quality of Life Index has been associated with significant improvement in overall quality of life, self-image, physical symptoms, and general health in studies assessing change in quality of life.   PHQ-9: Review Flowsheet       06/03/2022  Depression screen PHQ 2/9  Decreased Interest 0  Down, Depressed, Hopeless 0  PHQ - 2 Score 0  Altered sleeping 0  Tired, decreased energy 2  Change in appetite 0  Feeling bad or failure about yourself  0  Trouble concentrating 0  Moving slowly or fidgety/restless 0  Suicidal thoughts 0  PHQ-9 Score 2  Difficult doing work/chores Not difficult at all   Interpretation of Total Score  Total Score Depression Severity:  1-4 = Minimal depression, 5-9 = Mild depression, 10-14 = Moderate depression, 15-19 = Moderately severe depression, 20-27 = Severe depression   Psychosocial Evaluation and Intervention:  Psychosocial Evaluation - 06/03/22 1324       Psychosocial Evaluation & Interventions   Interventions Encouraged to exercise with the program and follow exercise prescription    Comments Pt has no barriers to participate in PR. He has no identifiable psychosocial issues. 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 His health conditions cause him to take frequent breaks while doing these activities and he has to sit while doing them. He reports that he has a good support system with his  daughter. Her reports that he divorced his wife three years ago and he is no longer stressed now that he is divorced. His goals while in the program are to lose weight and to decrease his SOB with his ADL's. He is eager to begin the program.    Expected Outcomes Pt will continue to have no identifiable psychosocial issues.    Continue Psychosocial Services  No Follow up required             Psychosocial Re-Evaluation:   Psychosocial Discharge (Final Psychosocial Re-Evaluation):    Education: Education Goals: Education classes will be provided on a weekly basis, covering required topics. Participant will state understanding/return demonstration of topics presented.  Learning Barriers/Preferences:  Learning Barriers/Preferences - 06/03/22 1308       Learning Barriers/Preferences   Learning Barriers None    Learning Preferences Audio;Verbal Instruction             Education Topics: How Lungs Work and Diseases: - Discuss the anatomy of the lungs and diseases that can affect the lungs, such as COPD.   Exercise: -Discuss the importance of exercise, FITT principles of exercise, normal and abnormal responses to exercise, and how to exercise safely.   Environmental Irritants: -Discuss types of environmental irritants and how to limit exposure to environmental irritants.   Meds/Inhalers and oxygen: - Discuss respiratory medications, definition of an inhaler and oxygen, and the proper way to use an inhaler and oxygen.   Energy Saving Techniques: - Discuss methods to conserve energy and decrease shortness of breath when performing activities of daily living.    Bronchial Hygiene / Breathing Techniques: - Discuss breathing mechanics, pursed-lip breathing technique,  proper posture, effective ways to clear airways, and other functional breathing techniques   Cleaning Equipment: - Provides group verbal and written instruction about the health risks of elevated stress,  cause of high stress, and healthy ways to reduce stress.   Nutrition I: Fats: - Discuss the types of cholesterol, what cholesterol does to the body, and how cholesterol levels can be controlled.   Nutrition II: Labels: -Discuss the different components of food labels and how to read food labels.   Respiratory Infections: - Discuss the signs and symptoms of respiratory infections, ways to prevent respiratory infections, and the importance of seeking medical treatment when having a respiratory infection.   Stress I: Signs and Symptoms: - Discuss the causes of stress, how stress may lead to anxiety and depression, and ways to limit stress.   Stress II: Relaxation: -Discuss relaxation techniques to limit stress.   Oxygen for Home/Travel: - Discuss how to prepare for travel when  on oxygen and proper ways to transport and store oxygen to ensure safety.   Knowledge Questionnaire Score:  Knowledge Questionnaire Score - 06/03/22 1245       Knowledge Questionnaire Score   Pre Score 10/18             Core Components/Risk Factors/Patient Goals at Admission:  Personal Goals and Risk Factors at Admission - 06/03/22 1311       Core Components/Risk Factors/Patient Goals on Admission    Weight Management Yes;Obesity;Weight Loss    Intervention Obesity: Provide education and appropriate resources to help participant work on and attain dietary goals.;Weight Management/Obesity: Establish reasonable short term and long term weight goals.;Weight Management: Provide education and appropriate resources to help participant work on and attain dietary goals.;Weight Management: Develop a combined nutrition and exercise program designed to reach desired caloric intake, while maintaining appropriate intake of nutrient and fiber, sodium and fats, and appropriate energy expenditure required for the weight goal.    Expected Outcomes Short Term: Continue to assess and modify interventions until short  term weight is achieved;Long Term: Adherence to nutrition and physical activity/exercise program aimed toward attainment of established weight goal;Weight Maintenance: Understanding of the daily nutrition guidelines, which includes 25-35% calories from fat, 7% or less cal from saturated fats, less than '200mg'$  cholesterol, less than 1.5gm of sodium, & 5 or more servings of fruits and vegetables daily;Understanding recommendations for meals to include 15-35% energy as protein, 25-35% energy from fat, 35-60% energy from carbohydrates, less than '200mg'$  of dietary cholesterol, 20-35 gm of total fiber daily;Understanding of distribution of calorie intake throughout the day with the consumption of 4-5 meals/snacks;Weight Gain: Understanding of general recommendations for a high calorie, high protein meal plan that promotes weight gain by distributing calorie intake throughout the day with the consumption for 4-5 meals, snacks, and/or supplements    Improve shortness of breath with ADL's Yes    Intervention Provide education, individualized exercise plan and daily activity instruction to help decrease symptoms of SOB with activities of daily living.    Expected Outcomes Short Term: Improve cardiorespiratory fitness to achieve a reduction of symptoms when performing ADLs;Long Term: Be able to perform more ADLs without symptoms or delay the onset of symptoms    Diabetes Yes    Intervention Provide education about signs/symptoms and action to take for hypo/hyperglycemia.;Provide education about proper nutrition, including hydration, and aerobic/resistive exercise prescription along with prescribed medications to achieve blood glucose in normal ranges: Fasting glucose 65-99 mg/dL    Expected Outcomes Short Term: Participant verbalizes understanding of the signs/symptoms and immediate care of hyper/hypoglycemia, proper foot care and importance of medication, aerobic/resistive exercise and nutrition plan for blood glucose  control.;Long Term: Attainment of HbA1C < 7%.    Heart Failure Yes    Intervention Provide a combined exercise and nutrition program that is supplemented with education, support and counseling about heart failure. Directed toward relieving symptoms such as shortness of breath, decreased exercise tolerance, and extremity edema.    Expected Outcomes Improve functional capacity of life;Short term: Attendance in program 2-3 days a week with increased exercise capacity. Reported lower sodium intake. Reported increased fruit and vegetable intake. Reports medication compliance.;Short term: Daily weights obtained and reported for increase. Utilizing diuretic protocols set by physician.;Long term: Adoption of self-care skills and reduction of barriers for early signs and symptoms recognition and intervention leading to self-care maintenance.             Core Components/Risk Factors/Patient Goals Review:  Core Components/Risk Factors/Patient Goals at Discharge (Final Review):    ITP Comments:   Comments: Patient arrived for 1st visit/orientation/education at 1230. Patient was referred to PR by Dr. Verlee Monte due to centrilobular emphysema (J43.2). During orientation advised patient on arrival and appointment times what to wear, what to do before, during and after exercise. Reviewed attendance and class policy.  Pt is scheduled to return Cardiac Rehab on 06/06/2022 at 1330. Pt was advised to come to class 15 minutes before class starts.  Discussed RPE/Dpysnea scales. Patient participated in warm up stretches. Patient was able to complete 6 minute walk test. Patient was measured for the equipment. Discussed equipment safety with patient. Took patient pre-anthropometric measurements. Patient finished visit at 1320.

## 2022-06-06 ENCOUNTER — Encounter (HOSPITAL_COMMUNITY)
Admission: RE | Admit: 2022-06-06 | Discharge: 2022-06-06 | Disposition: A | Discharge status: Home / Self Care | Payer: Medicare HMO | Source: Ambulatory Visit | Attending: Student | Admitting: Student

## 2022-06-06 DIAGNOSIS — J432 Centrilobular emphysema: Secondary | ICD-10-CM | POA: Diagnosis not present

## 2022-06-06 NOTE — Progress Notes (Signed)
Daily Session Note  Patient Details  Name: Vincent Cooper MRN: [id number] Date of Birth: 1941-08-28 Referring Provider:   Flowsheet Row PULMONARY REHAB OTHER RESP ORIENTATION from 06/03/2022 in [facility]  Referring Provider Dr. Verlee Monte       Encounter Date: 06/06/2022  Check In:  Session Check In - 06/06/22 1328       Check-In   Supervising physician immediately available to respond to emergencies [facility] MD immediately available    Physician(s) Dr Zandra Abts    Location [facility]    Staff Present Hoy Register, MS, ACSM-CEP, Exercise Physiologist;Heather Zigmund Daniel, Exercise Physiologist;Rylynn Schoneman Hassell Done, RN, BSN    Virtual Visit No    Medication changes reported     No    Fall or balance concerns reported    Yes    Comments Has not fallen, but he uses a walking cane for support. He admits to losing his balance but not falling.    Tobacco Cessation No Change    Warm-up and Cool-down Performed as group-led instruction    Resistance Training Performed Yes    VAD Patient? No    PAD/SET Patient? No      Pain Assessment   Currently in Pain? No/denies    Multiple Pain Sites No             Capillary Blood Glucose: No results found for this or any previous visit (from the past 24 hour(s)).    Social History   Tobacco Use  Smoking Status Former   Packs/day: 2.00   Years: 55.00   Total pack years: 110.00   Types: Cigarettes   Quit date: 11/18/2001   Years since quitting: 20.5  Smokeless Tobacco Former    Goals Met:  Proper associated with RPD/PD & O2 Sat Independence with exercise equipment Using PLB without cueing & demonstrates good technique Exercise tolerated well Queuing for purse lip breathing No report of concerns or symptoms today Strength training completed today  Goals Unmet:  Not Applicable  Comments: checkout at 1430.   Dr. Kathie Dike is Medical Director for [facility].

## 2022-06-06 NOTE — Progress Notes (Signed)
onar  Synopsis: Referred for COPD by Manon Hilding, MD  Subjective:   PATIENT ID: Vincent Cooper GENDER: male DOB: July 20, 1941, MRN: [id number]  Chief Complaint  Patient presents with   Follow-up    PFT's done today. Breathing has improved since the last visit. He has had 2 doses of Nucala and tolerating well.    81yM with history of COPD on trelegy 200 and systemic steroids and used to see pulmonologist in [place], CAD, DM2, OSA on CPAP, HTN, PE 10/2019 - he is unsure if he still taking eliquis due to this PE  He has DOE to 100 feet. Worsening gradually overall but can have episodic worsening. He does have a productive cough typically in the morning. No CP. Has been on trelegy for a couple years.   He has been on systemic steroids for 2 months. Down to 10 mg daily.   He has never tried pulmonary rehab before  Not smoking currently, quit 20 ya. 50 py+ smoking.   Uses CPAP every night. But does have trouble breathing if he lies down flat. Probably excessively sleepy during the day.   Never had asthma as a kid, does frequently have sinus congestion but no history of sinus surgeries.   He father had emphysema  He worked in home improvement. He has lived in [place] and [place]. No MJ or vaping.   Interval HPI: Started process for nucala last visit, started breztri.  Using it without a spacer. No sinus congestion. DOE has improved substantially. No cough. He did his own nucala injection a week ago (second dose). No discernible adverse reaction to it.   Starting pulmonary rehab  Otherwise pertinent review of systems is negative.  Past Medical History:  Diagnosis Date   Chronic gout    COPD (chronic obstructive pulmonary disease) ([place])    Oxygen dependent   Coronary artery disease due to calcified coronary lesion 03/08/2020   CORONARY CTA: Cor Ca++ Score 1651 !!: ~ 50% LM (CTFFR 1 - NS). Large Dom RCA-<PDA-PAV/PL - diffuse mild-mod plaque: prox (25-49%), mid (50-69%) CTFFR (p 0.99, m 0.85, d  0.81 - NS).  Med-Size LAD - prox-mid long diffuse Mod-Severe Ca++ plaque (~50-69%, ?>70%): CTFFR p 0.95, m 0.88, d 0.86 -NS.  CTFFR LCx 0.94, OM1 0.90 - NS. (NS=Not Significant).   Diabetes mellitus type II, non insulin dependent (HCC)    Hypertension    Morbid obesity (HCC)    BMI of 38.5 with multiple risk factors.   OSA on CPAP    Pulmonary emboli ([place]) 10/2019   Chest CTA-4 [place] opacification of main PA but there is partially occlusive main posterior RLL and segmental/segmental branches.  Small thrombus noted in the anterior right middle lobe.  No RV strain.  Scattered aortic atherosclerosis involving great vessels.  Coronary calcification noted.   RLS (restless legs syndrome)      Family History  Problem Relation Age of Onset   Colon cancer Mother    Emphysema Father        smoked   Diverticulitis Sister    Diabetes Brother    Diabetes Brother    Heart Problems Maternal Grandmother    Breast cancer Daughter    Lupus Son    CAD Neg Hx      Past Surgical History:  Procedure Laterality Date   BOWEL RESECTION N/A 10/18/2020   Procedure: SMALL BOWEL RESECTION;  Surgeon: Virl Cagey, MD;  Location: [facility];  Service: General;  Laterality: N/A;   Cataract surgery  Right    COLONOSCOPY WITH PROPOFOL N/A 09/27/2020   Procedure: COLONOSCOPY WITH PROPOFOL;  Surgeon: Rogene Houston, MD;  Location: [facility];  Service: Endoscopy;  Laterality: N/A;  1:15   COLOSTOMY REVERSAL N/A 10/18/2020   Procedure: COLOSTOMY REVERSAL;  Surgeon: Virl Cagey, MD;  Location: [facility];  Service: General;  Laterality: N/A;   LAPAROTOMY N/A 06/21/2020   Procedure: EXPLORATORY LAPAROTOMY,  bowel resection, creation ostomy;  Surgeon: Virl Cagey, MD;  Location: [facility];  Service: General;  Laterality: N/A;   POLYPECTOMY  09/27/2020   Procedure: POLYPECTOMY;  Surgeon: Rogene Houston, MD;  Location: [facility];  Service: Endoscopy;;   TRANSTHORACIC ECHOCARDIOGRAM  10/20/2020   EF 60  to 65%.  Moderate LVH at least GR 1 DD.  Mild to moderate LA dilation.  Moderate aortic valve calcification-sclerosis with no stenosis.  Normal RAP.    Social History   Socioeconomic History   Marital status: Legally Separated    Spouse name: Not on file   Number of children: 3   Years of education: Not on file   Highest education level: Not on file  Occupational History   Occupation: retired  Tobacco Use   Smoking status: Former    Packs/day: 2.00    Years: 55.00    Total pack years: 110.00    Types: Cigarettes    Quit date: 11/18/2001    Years since quitting: 20.5   Smokeless tobacco: Former  Scientific laboratory technician Use: Never used  Substance and Sexual Activity   Alcohol use: Not Currently   Drug use: Never   Sexual activity: Not on file  Other Topics Concern   Not on file  Social History Narrative   Not on file   Social Determinants of Health   Financial Resource Strain: Not on file  Food Insecurity: Not on file  Transportation Needs: Not on file  Physical Activity: Not on file  Stress: Not on file  Social Connections: Not on file  Intimate Partner Violence: Not on file     Allergies  Allergen Reactions   Other     Patient reports he was allergic to something in an IV he was given but does not know what the substance was. As of 01/19/2020      Outpatient Medications Prior to Visit  Medication Sig Dispense Refill   albuterol (VENTOLIN HFA) 108 (90 Base) MCG/ACT inhaler Inhale 1 puff into the lungs every 6 (six) hours as needed for wheezing or shortness of breath.     apixaban (ELIQUIS) 2.5 MG TABS tablet Take 2.5 mg by mouth 2 (two) times daily.     betamethasone dipropionate 0.05 % cream Apply 1 Application topically daily as needed (irritation).     Budeson-Glycopyrrol-Formoterol (BREZTRI AEROSPHERE) 160-9-4.8 MCG/ACT AERO Inhale 2 puffs into the lungs in the morning and at bedtime. 10.7 g 11   diltiazem (CARDIZEM CD) 180 MG 24 hr capsule Take 360 mg by mouth  every morning.     docusate sodium (COLACE) 100 MG capsule Take 100 mg by mouth daily.     ipratropium-albuterol (DUONEB) 0.5-2.5 (3) MG/3ML SOLN Take 3 mLs by nebulization every 6 (six) hours as needed (shortness of breath).      Mepolizumab (NUCALA) 100 MG/ML SOAJ Inject 1 mL (100 mg total) into the skin every 28 (twenty-eight) days. 3 mL 1   metoprolol tartrate (LOPRESSOR) 25 MG tablet Take 1 tablet (25 mg total) by mouth 2 (two) times daily.  60 tablet 3   nitroGLYCERIN (NITROSTAT) 0.4 MG SL tablet Place 0.4 mg under the tongue every 5 (five) minutes as needed for chest pain.     potassium chloride SA (KLOR-CON) 20 MEQ tablet Take 20 mEq by mouth daily.     predniSONE (DELTASONE) 5 MG tablet Take 2 tablets (10 mg total) by mouth daily with breakfast. 60 tablet 2   rosuvastatin (CRESTOR) 40 MG tablet TAKE 1 TABLET BY MOUTH ONCE DAILY. (Patient taking differently: Take 40 mg by mouth at bedtime.) 90 tablet 1   Spacer/Aero-Holding Chambers (AEROCHAMBER MV) inhaler Use as instructed 1 each 0   tamsulosin (FLOMAX) 0.4 MG CAPS capsule Take 1 capsule (0.4 mg total) by mouth daily. (Patient taking differently: Take 0.4 mg by mouth at bedtime.) 30 capsule 1   torsemide (DEMADEX) 20 MG tablet Take 1 tablet (20 mg total) by mouth 2 (two) times daily. May take an extra 20 mg if needed for increase swelling (Patient taking differently: Take 20-40 mg by mouth See admin instructions. 40 mg in the morning, 20 mg in the evening) 200 tablet 3   No facility-administered medications prior to visit.       Objective:   Physical Exam:  General appearance: 81 y.o., male, NAD, conversant  Eyes: anicteric sclerae; PERRL, tracking appropriately HENT: NCAT; MMM Neck: Trachea midline; no lymphadenopathy, no JVD Lungs: good air movement bilaterally!, with normal work of breathing CV: RRR, no murmur  Abdomen: Soft, non-tender; non-distended, BS present  Extremities: 1+ pitting BLE edema, warm Skin: Normal turgor  and texture; no rash Psych: Appropriate affect Neuro: Alert and oriented to person and place, no focal deficit     Vitals:   06/07/22 1411  BP: 128/74  Pulse: 70  Temp: 98 F (36.7 C)  TempSrc: Oral  SpO2: 97%  Weight: 269 lb (122 kg)  Height: '5\' 6"'$  (1.676 m)    97% on RA BMI Readings from Last 3 Encounters:  06/07/22 43.42 kg/m  06/03/22 39.72 kg/m  05/27/22 39.87 kg/m   Wt Readings from Last 3 Encounters:  06/07/22 269 lb (122 kg)  06/03/22 268 lb 15.4 oz (122 kg)  05/27/22 270 lb (122.5 kg)     CBC    Component Value Date/Time   WBC 11.4 (H) 02/03/2022 1820   RBC 5.20 02/03/2022 1820   HGB 16.9 02/03/2022 1820   HCT 49.5 02/03/2022 1820   PLT 195 02/03/2022 1820   MCV 95.2 02/03/2022 1820   MCH 32.5 02/03/2022 1820   MCHC 34.1 02/03/2022 1820   RDW 14.3 02/03/2022 1820   LYMPHSABS 1.4 02/03/2022 1820   MONOABS 0.9 02/03/2022 1820   EOSABS 0.7 (H) 02/03/2022 1820   BASOSABS 0.1 02/03/2022 1820    Remarkable eosinophilia historically  Chest Imaging: CTA Chest 2020 with a little upper lobe emphysema and bronchial wall thickening  Pulmonary Functions Testing Results:    Latest Ref Rng & Units 06/07/2022   12:49 PM  PFT Results  FVC-Pre L 2.61  P  FVC-Predicted Pre % 77  P  FVC-Post L 2.60  P  FVC-Predicted Post % 76  P  Pre FEV1/FVC % % 67  P  Post FEV1/FCV % % 67  P  FEV1-Pre L 1.75  P  FEV1-Predicted Pre % 73  P  FEV1-Post L 1.73  P  DLCO uncorrected ml/min/mmHg 16.34  P  DLCO UNC% % 76  P  DLCO corrected ml/min/mmHg 16.34  P  DLCO COR %Predicted % 76  P  DLVA Predicted % 87  P  TLC L 5.60  P  TLC % Predicted % 89  P  RV % Predicted % 116  P    P Preliminary result     Echocardiogram:   TTE 10/2020:  1. Left ventricular ejection fraction, by estimation, is 60 to 65%. The  left ventricle has normal function. The left ventricle has no regional  wall motion abnormalities. There is moderate left ventricular hypertrophy.  Left  ventricular diastolic  parameters are indeterminate.   2. Right ventricular systolic function is normal. The right ventricular  size is normal.   3. Left atrial size was mild to moderately dilated.   4. The mitral valve is normal in structure. No evidence of mitral valve  regurgitation. No evidence of mitral stenosis.   5. The aortic valve has an indeterminant number of cusps. There is  moderate calcification of the aortic valve. There is moderate thickening  of the aortic valve. Aortic valve regurgitation is not visualized. No  aortic stenosis is present.   6. The inferior vena cava is normal in size with greater than 50%  respiratory variability, suggesting right atrial pressure of 3 mmHg.      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 Excellent response to nucala. We will try to wean steroids off.   # OSA on CPAP [facility] is DME supplier  Plan: - continue nucala  - taper steroid dose as follows:  Prednisone 7.5 mg daily for 2 weeks, then 5 mg daily for 2 weeks, then 4 mg daily for 1 week, 3 mg daily for 1 week, 2 mg daily for 1 week, then 1 mg daily for 1 week then stop - if you feel worse after tapering steroids, go back to lowest possible dose where you feel ok and let our office know  - stay on breztri 2 puffs twice daily, rinse mouth/brush tongue after each use - albuterol as needed  - if feeling watery eyes, sinus congestion despite above measures, consider over the counter xyzal  - request download of CPAP from [facility] next visit - see you in 6 weeks or sooner if need be!    Maryjane Hurter, MD [facility] 06/07/2022 2:30 PM

## 2022-06-07 ENCOUNTER — Ambulatory Visit (INDEPENDENT_AMBULATORY_CARE_PROVIDER_SITE_OTHER): Payer: Medicare HMO | Admitting: Student

## 2022-06-07 ENCOUNTER — Encounter: Payer: Self-pay | Admitting: Student

## 2022-06-07 VITALS — BP 128/74 | HR 70 | Temp 98.0°F | Ht 66.0 in | Wt 269.0 lb

## 2022-06-07 DIAGNOSIS — J449 Chronic obstructive pulmonary disease, unspecified: Secondary | ICD-10-CM | POA: Diagnosis not present

## 2022-06-07 DIAGNOSIS — J4550 Severe persistent asthma, uncomplicated: Secondary | ICD-10-CM | POA: Diagnosis not present

## 2022-06-07 LAB — PULMONARY FUNCTION TEST
DL/VA % pred: 87 %
DL/VA: 3.47 ml/min/mmHg/L
DLCO cor % pred: 76 %
DLCO cor: 16.34 ml/min/mmHg
DLCO unc % pred: 76 %
DLCO unc: 16.34 ml/min/mmHg
FEF 25-75 Post: 0.88 L/sec
FEF 25-75 Pre: 1.01 L/sec
FEF2575-%Change-Post: -12 %
FEF2575-%Pred-Post: 54 %
FEF2575-%Pred-Pre: 62 %
FEV1-%Change-Post: 0 %
FEV1-%Pred-Post: 73 %
FEV1-%Pred-Pre: 73 %
FEV1-Post: 1.73 L
FEV1-Pre: 1.75 L
FEV1FVC-%Change-Post: 0 %
FEV1FVC-%Pred-Pre: 93 %
FEV6-%Change-Post: 2 %
FEV6-%Pred-Post: 81 %
FEV6-%Pred-Pre: 80 %
FEV6-Post: 2.56 L
FEV6-Pre: 2.51 L
FEV6FVC-%Change-Post: 0 %
FEV6FVC-%Pred-Post: 106 %
FEV6FVC-%Pred-Pre: 105 %
FVC-%Change-Post: 0 %
FVC-%Pred-Post: 76 %
FVC-%Pred-Pre: 77 %
FVC-Post: 2.6 L
FVC-Pre: 2.61 L
Post FEV1/FVC ratio: 67 %
Post FEV6/FVC ratio: 98 %
Pre FEV1/FVC ratio: 67 %
Pre FEV6/FVC Ratio: 98 %
RV % pred: 116 %
RV: 2.85 L
TLC % pred: 89 %
TLC: 5.6 L

## 2022-06-07 MED ORDER — PREDNISONE 1 MG PO TABS: ORAL_TABLET | ORAL | 0 refills | Status: DC

## 2022-06-07 NOTE — Patient Instructions (Addendum)
-   continue nucala injection - taper steroid dose as follows:  Prednisone 7.5 mg daily for 2 weeks, then 5 mg daily for 2 weeks, then 4 mg daily for 1 week, 3 mg daily for 1 week, 2 mg daily for 1 week, then 1 mg daily for 1 week then stop - if you feel worse after tapering steroids, go back to lowest possible dose where you feel ok and let our office know  - stay on breztri 2 puffs twice daily, rinse mouth/brush tongue after each use - albuterol as needed  - if feeling watery eyes, sinus congestion despite above measures, consider over the counter xyzal  - see you in 8 weeks!

## 2022-06-07 NOTE — Progress Notes (Signed)
Full PFT Performed Today  

## 2022-06-07 NOTE — Patient Instructions (Signed)
Full PFT Performed Today  

## 2022-06-11 ENCOUNTER — Encounter (HOSPITAL_COMMUNITY)
Admission: RE | Admit: 2022-06-11 | Discharge: 2022-06-11 | Disposition: A | Discharge status: Home / Self Care | Payer: Medicare HMO | Source: Ambulatory Visit | Attending: Student | Admitting: Student

## 2022-06-11 DIAGNOSIS — J432 Centrilobular emphysema: Secondary | ICD-10-CM | POA: Diagnosis not present

## 2022-06-11 NOTE — Progress Notes (Signed)
Daily Session Note  Patient Details  Name: Vincent Cooper MRN: [id number] Date of Birth: Sep 12, 1941 Referring Provider:   Flowsheet Row PULMONARY REHAB OTHER RESP ORIENTATION from 06/03/2022 in [place]  Referring Provider Dr. Verlee Monte       Encounter Date: 06/11/2022  Check In:  Session Check In - 06/11/22 1330       Check-In   Supervising physician immediately available to respond to emergencies [facility] MD immediately available    Physician(s) Dr Debara Pickett    Location [facility]    Staff Present Hoy Register, MS, ACSM-CEP, Exercise Physiologist;Cooper Moroney Hassell Done, RN, BSN;Phyllis Billingsley, RN    Virtual Visit No    Medication changes reported     No    Fall or balance concerns reported    Yes    Comments Has not fallen, but he uses a walking cane for support. He admits to losing his balance but not falling.    Tobacco Cessation No Change    Warm-up and Cool-down Performed as group-led instruction    Resistance Training Performed Yes    VAD Patient? No    PAD/SET Patient? No      Pain Assessment   Currently in Pain? No/denies    Multiple Pain Sites No             Capillary Blood Glucose: No results found for this or any previous visit (from the past 24 hour(s)).    Social History   Tobacco Use  Smoking Status Former   Packs/day: 2.00   Years: 55.00   Total pack years: 110.00   Types: Cigarettes   Quit date: 11/18/2001   Years since quitting: 20.5  Smokeless Tobacco Former    Goals Met:  Proper associated with RPD/PD & O2 Sat Independence with exercise equipment Improved SOB with ADL's Exercise tolerated well Queuing for purse lip breathing No report of concerns or symptoms today Strength training completed today  Goals Unmet:  Not Applicable  Comments: Checkout at 1430.   Dr. Kathie Dike is Medical Director for [facility].

## 2022-06-13 ENCOUNTER — Encounter (HOSPITAL_COMMUNITY)
Admission: RE | Admit: 2022-06-13 | Discharge: 2022-06-13 | Disposition: A | Discharge status: Home / Self Care | Payer: Medicare HMO | Source: Ambulatory Visit | Attending: Student | Admitting: Student

## 2022-06-13 DIAGNOSIS — J432 Centrilobular emphysema: Secondary | ICD-10-CM

## 2022-06-13 NOTE — Progress Notes (Signed)
Daily Session Note  Patient Details  Name: Vincent Cooper MRN: [id number] Date of Birth: 1941-03-23 Referring Provider:   Flowsheet Row PULMONARY REHAB OTHER RESP ORIENTATION from 06/03/2022 in [place]  Referring Provider Dr. Verlee Monte       Encounter Date: 06/13/2022  Check In:  Session Check In - 06/13/22 1330       Check-In   Supervising physician immediately available to respond to emergencies [facility] MD immediately available    Physician(s) Dr. Gardiner Rhyme    Location [facility]    Staff Present Geanie Cooley, RN;Daphyne Hassell Done, RN, Bjorn Loser, MS, ACSM-CEP, Exercise Physiologist    Virtual Visit No    Medication changes reported     No    Fall or balance concerns reported    Yes    Comments Has not fallen, but he uses a walking cane for support. He admits to losing his balance but not falling.    Tobacco Cessation No Change    Warm-up and Cool-down Performed as group-led instruction    Resistance Training Performed Yes    VAD Patient? No    PAD/SET Patient? No      Pain Assessment   Currently in Pain? No/denies    Multiple Pain Sites No             Capillary Blood Glucose: No results found for this or any previous visit (from the past 24 hour(s)).    Social History   Tobacco Use  Smoking Status Former   Packs/day: 2.00   Years: 55.00   Total pack years: 110.00   Types: Cigarettes   Quit date: 11/18/2001   Years since quitting: 20.5  Smokeless Tobacco Former    Goals Met:  Proper associated with RPD/PD & O2 Sat Independence with exercise equipment Exercise tolerated well No report of concerns or symptoms today Strength training completed today  Goals Unmet:  Not Applicable  Comments: check out @ 2:30pm   Dr. Kathie Dike is Medical Director for [facility].

## 2022-06-15 ENCOUNTER — Other Ambulatory Visit: Payer: Self-pay

## 2022-06-15 ENCOUNTER — Emergency Department (HOSPITAL_COMMUNITY): Payer: Medicare HMO

## 2022-06-15 ENCOUNTER — Emergency Department (HOSPITAL_COMMUNITY)
Admission: EM | Admit: 2022-06-15 | Discharge: 2022-06-15 | Disposition: A | Discharge status: Home / Self Care | Payer: Medicare HMO | Attending: Emergency Medicine | Admitting: Emergency Medicine

## 2022-06-15 ENCOUNTER — Encounter (HOSPITAL_COMMUNITY): Payer: Self-pay

## 2022-06-15 DIAGNOSIS — K56609 Unspecified intestinal obstruction, unspecified as to partial versus complete obstruction: Secondary | ICD-10-CM

## 2022-06-15 DIAGNOSIS — K439 Ventral hernia without obstruction or gangrene: Secondary | ICD-10-CM | POA: Insufficient documentation

## 2022-06-15 DIAGNOSIS — Z7901 Long term (current) use of anticoagulants: Secondary | ICD-10-CM | POA: Diagnosis not present

## 2022-06-15 DIAGNOSIS — R109 Unspecified abdominal pain: Secondary | ICD-10-CM | POA: Diagnosis not present

## 2022-06-15 DIAGNOSIS — K436 Other and unspecified ventral hernia with obstruction, without gangrene: Secondary | ICD-10-CM | POA: Diagnosis not present

## 2022-06-15 DIAGNOSIS — R1084 Generalized abdominal pain: Secondary | ICD-10-CM | POA: Diagnosis not present

## 2022-06-15 DIAGNOSIS — E876 Hypokalemia: Secondary | ICD-10-CM | POA: Insufficient documentation

## 2022-06-15 DIAGNOSIS — I10 Essential (primary) hypertension: Secondary | ICD-10-CM | POA: Diagnosis not present

## 2022-06-15 DIAGNOSIS — Z79899 Other long term (current) drug therapy: Secondary | ICD-10-CM | POA: Insufficient documentation

## 2022-06-15 LAB — CBC WITH DIFFERENTIAL/PLATELET
Abs Immature Granulocytes: 0.04 10*3/uL (ref 0.00–0.07)
Basophils Absolute: 0 10*3/uL (ref 0.0–0.1)
Basophils Relative: 0 %
Eosinophils Absolute: 0.1 10*3/uL (ref 0.0–0.5)
Eosinophils Relative: 1 %
HCT: 42.4 % (ref 39.0–52.0)
Hemoglobin: 14.7 g/dL (ref 13.0–17.0)
Immature Granulocytes: 0 %
Lymphocytes Relative: 11 %
Lymphs Abs: 1.1 10*3/uL (ref 0.7–4.0)
MCH: 33.1 pg (ref 26.0–34.0)
MCHC: 34.7 g/dL (ref 30.0–36.0)
MCV: 95.5 fL (ref 80.0–100.0)
Monocytes Absolute: 0.9 10*3/uL (ref 0.1–1.0)
Monocytes Relative: 9 %
Neutro Abs: 7.6 10*3/uL (ref 1.7–7.7)
Neutrophils Relative %: 79 %
Platelets: 158 10*3/uL (ref 150–400)
RBC: 4.44 MIL/uL (ref 4.22–5.81)
RDW: 15 % (ref 11.5–15.5)
WBC: 9.7 10*3/uL (ref 4.0–10.5)
nRBC: 0 % (ref 0.0–0.2)

## 2022-06-15 LAB — URINALYSIS, ROUTINE W REFLEX MICROSCOPIC
Bilirubin Urine: NEGATIVE
Glucose, UA: NEGATIVE mg/dL
Hgb urine dipstick: NEGATIVE
Ketones, ur: NEGATIVE mg/dL
Leukocytes,Ua: NEGATIVE
Nitrite: NEGATIVE
Protein, ur: NEGATIVE mg/dL
Specific Gravity, Urine: 1.032 — ABNORMAL HIGH (ref 1.005–1.030)
pH: 8 (ref 5.0–8.0)

## 2022-06-15 LAB — COMPREHENSIVE METABOLIC PANEL
ALT: 24 U/L (ref 0–44)
AST: 18 U/L (ref 15–41)
Albumin: 3.3 g/dL — ABNORMAL LOW (ref 3.5–5.0)
Alkaline Phosphatase: 46 U/L (ref 38–126)
Anion gap: 7 (ref 5–15)
BUN: 15 mg/dL (ref 8–23)
CO2: 29 mmol/L (ref 22–32)
Calcium: 8.4 mg/dL — ABNORMAL LOW (ref 8.9–10.3)
Chloride: 102 mmol/L (ref 98–111)
Creatinine, Ser: 1.09 mg/dL (ref 0.61–1.24)
GFR, Estimated: >60 mL/min (ref >60)
Glucose, Bld: 172 mg/dL — ABNORMAL HIGH (ref 70–99)
Potassium: 3.1 mmol/L — ABNORMAL LOW (ref 3.5–5.1)
Sodium: 138 mmol/L (ref 135–145)
Total Bilirubin: 1.8 mg/dL — ABNORMAL HIGH (ref 0.3–1.2)
Total Protein: 5.9 g/dL — ABNORMAL LOW (ref 6.5–8.1)

## 2022-06-15 LAB — LIPASE, BLOOD: Lipase: 24 U/L (ref 11–51)

## 2022-06-15 MED ORDER — IOHEXOL 300 MG/ML  SOLN
100.0000 mL | Freq: Once | INTRAMUSCULAR | Status: AC | PRN
  Administered 2022-06-15: 100 mL via INTRAVENOUS

## 2022-06-15 MED ORDER — FENTANYL CITRATE PF 50 MCG/ML IJ SOSY: 50.0000 ug | PREFILLED_SYRINGE | INTRAMUSCULAR | Status: DC | PRN

## 2022-06-15 MED ORDER — SODIUM CHLORIDE 0.9 % IV SOLN: INTRAVENOUS | Status: DC

## 2022-06-15 MED ORDER — POTASSIUM CHLORIDE 10 MEQ/100ML IV SOLN
10.0000 meq | Freq: Once | INTRAVENOUS | Status: AC
  Administered 2022-06-15: 10 meq via INTRAVENOUS
  Filled 2022-06-15: qty 100

## 2022-06-15 NOTE — Consult Note (Signed)
Reason for Consult: Incisional hernia Referring Physician: Dr. Hillery Jacks Bornemann is an 81 y.o. male.  HPI: Patient is a 81 year old white male with multiple medical problems including history of pulmonary embolism, chronic anticoagulation on Eliquis, hypertension, and morbid obesity who presents to the emergency room with lower abdominal pain.  This occurred acutely on 06/13/2022.  He denies any nausea or vomiting.  His last bowel movement was this morning prior to presenting to the emergency room.  He states he has lower abdominal pain.  A CT scan of the abdomen and pelvis was performed which revealed two ventral hernias that appear to be partially obstructive in nature as they contain small bowel.  He states he has a known incisional hernia from the midline.  He has no tenderness in the mid abdomen.  Past Medical History:  Diagnosis Date   Chronic gout    COPD (chronic obstructive pulmonary disease) ([place])    Oxygen dependent   Coronary artery disease due to calcified coronary lesion 03/08/2020   CORONARY CTA: Cor Ca++ Score 1651 !!: ~ 50% LM (CTFFR 1 - NS). Large Dom RCA-<PDA-PAV/PL - diffuse mild-mod plaque: prox (25-49%), mid (50-69%) CTFFR (p 0.99, m 0.85, d 0.81 - NS).  Med-Size LAD - prox-mid long diffuse Mod-Severe Ca++ plaque (~50-69%, ?>70%): CTFFR p 0.95, m 0.88, d 0.86 -NS.  CTFFR LCx 0.94, OM1 0.90 - NS. (NS=Not Significant).   Diabetes mellitus type II, non insulin dependent (HCC)    Hypertension    Morbid obesity (HCC)    BMI of 38.5 with multiple risk factors.   OSA on CPAP    Pulmonary emboli ([place]) 10/2019   Chest CTA-4 [place] opacification of main PA but there is partially occlusive main posterior RLL and segmental/segmental branches.  Small thrombus noted in the anterior right middle lobe.  No RV strain.  Scattered aortic atherosclerosis involving great vessels.  Coronary calcification noted.   RLS (restless legs syndrome)     Past Surgical History:  Procedure Laterality  Date   BOWEL RESECTION N/A 10/18/2020   Procedure: SMALL BOWEL RESECTION;  Surgeon: Virl Cagey, MD;  Location: [facility];  Service: General;  Laterality: N/A;   Cataract surgery Right    COLONOSCOPY WITH PROPOFOL N/A 09/27/2020   Procedure: COLONOSCOPY WITH PROPOFOL;  Surgeon: Rogene Houston, MD;  Location: [facility];  Service: Endoscopy;  Laterality: N/A;  1:15   COLOSTOMY REVERSAL N/A 10/18/2020   Procedure: COLOSTOMY REVERSAL;  Surgeon: Virl Cagey, MD;  Location: [facility];  Service: General;  Laterality: N/A;   LAPAROTOMY N/A 06/21/2020   Procedure: EXPLORATORY LAPAROTOMY,  bowel resection, creation ostomy;  Surgeon: Virl Cagey, MD;  Location: [facility];  Service: General;  Laterality: N/A;   POLYPECTOMY  09/27/2020   Procedure: POLYPECTOMY;  Surgeon: Rogene Houston, MD;  Location: [facility];  Service: Endoscopy;;   TRANSTHORACIC ECHOCARDIOGRAM  10/20/2020   EF 60 to 65%.  Moderate LVH at least GR 1 DD.  Mild to moderate LA dilation.  Moderate aortic valve calcification-sclerosis with no stenosis.  Normal RAP.    Family History  Problem Relation Age of Onset   Colon cancer Mother    Emphysema Father        smoked   Diverticulitis Sister    Diabetes Brother    Diabetes Brother    Heart Problems Maternal Grandmother    Breast cancer Daughter    Lupus Son    CAD Neg Hx  Social History:  reports that he quit smoking about 20 years ago. His smoking use included cigarettes. He has a 110.00 pack-year smoking history. He has quit using smokeless tobacco. He reports that he does not currently use alcohol. He reports that he does not use drugs.  Allergies:  Allergies  Allergen Reactions   Other     Patient reports he was allergic to something in an IV he was given but does not know what the substance was. As of 01/19/2020     Medications: Prior to Admission: (Not in a hospital admission)   Results for orders placed or performed during the hospital encounter  of 06/15/22 (from the past 48 hour(s))  Comprehensive metabolic panel     Status: Abnormal   Collection Time: 06/15/22  7:25 AM  Result Value Ref Range   Sodium 138 135 - 145 mmol/L   Potassium 3.1 (L) 3.5 - 5.1 mmol/L   Chloride 102 98 - 111 mmol/L   CO2 29 22 - 32 mmol/L   Glucose, Bld 172 (H) 70 - 99 mg/dL    Comment: Glucose reference range applies only to samples taken after fasting for at least 8 hours.   BUN 15 8 - 23 mg/dL   Creatinine, Ser 1.09 0.61 - 1.24 mg/dL   Calcium 8.4 (L) 8.9 - 10.3 mg/dL   Total Protein 5.9 (L) 6.5 - 8.1 g/dL   Albumin 3.3 (L) 3.5 - 5.0 g/dL   AST 18 15 - 41 U/L   ALT 24 0 - 44 U/L   Alkaline Phosphatase 46 38 - 126 U/L   Total Bilirubin 1.8 (H) 0.3 - 1.2 mg/dL   GFR, Estimated >60 >60 mL/min    Comment: (NOTE) Calculated using the CKD-EPI Creatinine Equation (2021)    Anion gap 7 5 - 15    Comment: Performed at [facility], [address] [place], [place] [address]  Lipase, blood     Status: None   Collection Time: 06/15/22  7:25 AM  Result Value Ref Range   Lipase 24 11 - 51 U/L    Comment: Performed at [facility], [address], [place], [place] [address]  CBC with Diff     Status: None   Collection Time: 06/15/22  7:25 AM  Result Value Ref Range   WBC 9.7 4.0 - 10.5 K/uL   RBC 4.44 4.22 - 5.81 MIL/uL   Hemoglobin 14.7 13.0 - 17.0 g/dL   HCT 42.4 39.0 - 52.0 %   MCV 95.5 80.0 - 100.0 fL   MCH 33.1 26.0 - 34.0 pg   MCHC 34.7 30.0 - 36.0 g/dL   RDW 15.0 11.5 - 15.5 %   Platelets 158 150 - 400 K/uL   nRBC 0.0 0.0 - 0.2 %   Neutrophils Relative % 79 %   Neutro Abs 7.6 1.7 - 7.7 K/uL   Lymphocytes Relative 11 %   Lymphs Abs 1.1 0.7 - 4.0 K/uL   Monocytes Relative 9 %   Monocytes Absolute 0.9 0.1 - 1.0 K/uL   Eosinophils Relative 1 %   Eosinophils Absolute 0.1 0.0 - 0.5 K/uL   Basophils Relative 0 %   Basophils Absolute 0.0 0.0 - 0.1 K/uL   Immature Granulocytes 0 %   Abs Immature Granulocytes 0.04 0.00 - 0.07 K/uL    Comment:  Performed at [facility], [address], [place], [place] [address]  Urinalysis, Routine w reflex microscopic Urine, Clean Catch     Status: Abnormal   Collection  Time: 06/15/22  8:28 AM  Result Value Ref Range   Color, Urine YELLOW YELLOW   APPearance CLEAR CLEAR   Specific Gravity, Urine 1.032 (H) 1.005 - 1.030   pH 8.0 5.0 - 8.0   Glucose, UA NEGATIVE NEGATIVE mg/dL   Hgb urine dipstick NEGATIVE NEGATIVE   Bilirubin Urine NEGATIVE NEGATIVE   Ketones, ur NEGATIVE NEGATIVE mg/dL   Protein, ur NEGATIVE NEGATIVE mg/dL   Nitrite NEGATIVE NEGATIVE   Leukocytes,Ua NEGATIVE NEGATIVE    Comment: Performed at [facility], [address] [place], [place] [address]    CT ABDOMEN PELVIS W CONTRAST  Result Date: 06/15/2022 CLINICAL DATA:  81 year old male with abdominal pain. History of bowel perforation in 2021. EXAM: CT ABDOMEN AND PELVIS WITH CONTRAST TECHNIQUE: Multidetector CT imaging of the abdomen and pelvis was performed using the standard protocol following bolus administration of intravenous contrast. RADIATION DOSE REDUCTION: This exam was performed according to the departmental dose-optimization program which includes automated exposure control, adjustment of the mA and/or kV according to patient size and/or use of iterative reconstruction technique. CONTRAST:  171m OMNIPAQUE IOHEXOL 300 MG/ML  SOLN COMPARISON:  CT Abdomen and Pelvis 06/21/2020. FINDINGS: Lower chest: Borderline cardiomegaly. No pericardial or pleural effusion. Chronic lung base atelectasis or scarring. 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 Hepatobiliary: Trace perihepatic free fluid along the posterior right hepatic lobe. Rim calcified 2.2 cm gallstone in the neck of the gallbladder. No pericholecystic inflammation. No bile duct enlargement. Pancreas: Partially atrophy. Spleen: Negative. Adrenals/Urinary Tract: Negative. Stomach/Bowel: New ventral abdominal postoperative changes and bowel containing hernias (series  2, image 46). Herniated small bowel loop with a 3.4 cm hernia neck to the left of midline (series 6, image 93). And more leftward herniated small bowel loop with a smaller roughly 2 cm hernia neck (series 2, image 46). Multiple fluid-filled, dilated, and mildly inflamed small bowel loops throughout that region. Superimposed small bowel anastomosis on series 2, image 55. Stomach, duodenum and proximal jejunum are decompressed. Terminal ileum decompressed. Large bowel retained stool and diverticulosis. Normal appendix on Vascular/Lymphatic: Aortoiliac calcified atherosclerosis. Major arterial structures in the abdomen and pelvis appear patent. No lymphadenopathy. Portal venous system is patent. Reproductive: Negative. Other: No pelvic free fluid. Musculoskeletal: Mild chronic L3 compression fracture, L1 compression fracture are stable. No acute osseous abnormality identified. IMPRESSION: 1. Acute Small-bowel Obstruction appears secondary to two distinct incarcerated ventral abdominal hernias as seen on series 2, image 46. Underlying prior small bowel resection. Small volume of free fluid but no pneumoperitoneum. 2. Cholelithiasis. Large bowel diverticulosis. Aortic Atherosclerosis (ICD10-I70.0). Mild chronic lumbar compression fractures. Electronically Signed   By: HGenevie AnnM.D.   On: 06/15/2022 08:46    ROS:  Pertinent items are noted in HPI.  Blood pressure 119/64, pulse 86, temperature 98.1 F (36.7 C), temperature source Oral, resp. rate (!) 24, height '5\' 6"'$  (1.676 m), weight 124.7 kg, SpO2 91 %. Physical Exam: Morbidly obese white male in no acute distress Head is normocephalic, atraumatic Lungs are relatively clear to auscultation Abdomen is soft.  Multiple surgical scars are present.  I was able to palpate and reduce both the hernia seen on CT scan.  They were at the level of the former ostomy site.  1 was at the ostomy site and the other one was just medial to that.  No rigidity or peritoneal signs  are noted.  No abdominal tenderness is noted.  CT scan images personally reviewed  Assessment/Plan: Impression: Incisional hernias, easily reducible.  Patient  clinically has no signs of a bowel obstruction or strangulation.  At this point, the patient can be seen as an outpatient in our office.  He was given strict instructions to return to the emergency room should he develop worsening abdominal pain, nausea, vomiting, or or swelling at the hernia sites.  He understands and agrees to the treatment plan and will call the office Monday for follow-up appointment.  Discussed with Dr. Sabra Heck.  Aviva Signs 06/15/2022, 9:38 AM

## 2022-06-15 NOTE — ED Triage Notes (Signed)
Pt arrived from home via [facility] c/o sudden onset RLQ abdominal pain. Pt denies N/V/D. Pt ambulatory in NAD. Pt does endorse Hx of bowl perforation, most recently last year.

## 2022-06-15 NOTE — ED Provider Notes (Addendum)
[facility] EMERGENCY DEPARTMENT Provider Note   CSN: [id number] Arrival date & time: 06/15/22  0645     History  Chief Complaint  Patient presents with   Abdominal Pain    Vincent Cooper is a 81 y.o. male.   Abdominal Pain   This patient is an 81 year old male who is currently on Eliquis from a history of pulmonary embolism, he takes diltiazem and metoprolol as well as rosuvastatin.  The patient has a history of obesity and a prior rupture of bowel which required a resection and a diverting ostomy which has subsequently been taken down and reanastomosis.  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  The patient had surgery at this hospital for his perforated viscus, he has known permanent atrial fibrillation.  The patient presents after developing abdominal pain last night, this is in the mid to mid right abdomen, there is no associated nausea or vomiting, there is no diarrhea, he is having normal bowel movements and stools, the pain has been quite intense overnight and kind of fluctuates in that intensity.  He has no associated swelling, he is not coughing or short of breath, he has no chest pain or headache.  The pain does not radiate to his back or shoulders.  He arrives by paramedic transport, he is ambulatory  Home Medications Prior to Admission medications   Medication Sig Start Date End Date Taking? Authorizing Provider  apixaban (ELIQUIS) 2.5 MG TABS tablet Take 2.5 mg by mouth 2 (two) times daily.   Yes [provider]  diltiazem (CARDIZEM CD) 180 MG 24 hr capsule Take 360 mg by mouth every morning. 01/22/21  Yes [provider]  metoprolol tartrate (LOPRESSOR) 25 MG tablet Take 1 tablet (25 mg total) by mouth 2 (two) times daily. 06/29/20  Yes Emokpae, Courage, MD  potassium chloride SA (KLOR-CON) 20 MEQ tablet Take 20 mEq by mouth daily. 07/14/20  Yes [provider]  rosuvastatin (CRESTOR) 40 MG  tablet TAKE 1 TABLET BY MOUTH ONCE DAILY. Patient taking differently: Take 40 mg by mouth at bedtime. 02/19/22  Yes Leonie Man, MD  tamsulosin (FLOMAX) 0.4 MG CAPS capsule Take 1 capsule (0.4 mg total) by mouth daily. Patient taking differently: Take 0.4 mg by mouth at bedtime. 06/30/20  Yes Emokpae, Courage, MD  torsemide (DEMADEX) 20 MG tablet Take 1 tablet (20 mg total) by mouth 2 (two) times daily. May take an extra 20 mg if needed for increase swelling Patient taking differently: Take 20-40 mg by mouth See admin instructions. 40 mg in the morning, 20 mg in the evening 11/09/20  Yes Leonie Man, MD  albuterol (VENTOLIN HFA) 108 (90 Base) MCG/ACT inhaler Inhale 1 puff into the lungs every 6 (six) hours as needed for wheezing or shortness of breath.    [provider]  betamethasone dipropionate 0.05 % cream Apply 1 Application topically daily as needed (irritation).    [provider]  Budeson-Glycopyrrol-Formoterol (BREZTRI AEROSPHERE) 160-9-4.8 MCG/ACT AERO Inhale 2 puffs into the lungs in the morning and at bedtime. 04/12/22   Maryjane Hurter, MD  docusate sodium (COLACE) 100 MG capsule Take 100 mg by mouth daily.    [provider]  ipratropium-albuterol (DUONEB) 0.5-2.5 (3) MG/3ML SOLN Take 3 mLs by nebulization every 6 (six) hours as needed (shortness of breath).  09/09/20   [provider]  Mepolizumab (NUCALA) 100 MG/ML SOAJ Inject 1 mL (100 mg total) into the skin  every 28 (twenty-eight) days. 05/02/22   Maryjane Hurter, MD  nitroGLYCERIN (NITROSTAT) 0.4 MG SL tablet Place 0.4 mg under the tongue every 5 (five) minutes as needed for chest pain.    [provider]  predniSONE (DELTASONE) 1 MG tablet Take 5 tablets daily for 1 week, 4 tablets daily for 1 week, 3 tablets daily for 1 week, 2 tablets daily for 1 week, 1 tablet daily for one week then stop 06/07/22   Maryjane Hurter, MD  predniSONE (DELTASONE) 5 MG tablet Take 2 tablets  (10 mg total) by mouth daily with breakfast. 04/12/22   Maryjane Hurter, MD  Spacer/Aero-Holding Chambers (AEROCHAMBER MV) inhaler Use as instructed 04/12/22   Maryjane Hurter, MD      Allergies    Other    Review of Systems   Review of Systems  Gastrointestinal:  Positive for abdominal pain.  All other systems reviewed and are negative.   Physical Exam Updated Vital Signs BP 134/65   Pulse 87   Temp 98.1 F (36.7 C) (Oral)   Resp (!) 21   Ht 1.676 m ('5\' 6"'$ )   Wt 124.7 kg   SpO2 95%   BMI 44.39 kg/m  Physical Exam Vitals and nursing note reviewed.  Constitutional:      General: He is not in acute distress.    Appearance: He is well-developed.  HENT:     Head: Normocephalic and atraumatic.     Mouth/Throat:     Pharynx: No oropharyngeal exudate.  Eyes:     General: No scleral icterus.       Right eye: No discharge.        Left eye: No discharge.     Conjunctiva/sclera: Conjunctivae normal.     Pupils: Pupils are equal, round, and reactive to light.  Neck:     Thyroid: No thyromegaly.     Vascular: No JVD.  Cardiovascular:     Rate and Rhythm: Normal rate and regular rhythm.     Heart sounds: Normal heart sounds. No murmur heard.    No friction rub. No gallop.  Pulmonary:     Effort: Pulmonary effort is normal. No respiratory distress.     Breath sounds: Normal breath sounds. No wheezing or rales.  Abdominal:     General: Bowel sounds are normal. There is no distension.     Palpations: Abdomen is soft. There is no mass.     Tenderness: There is abdominal tenderness.     Comments: This patient is a very rotund and obese abdomen which is generally soft except for the mid abdomen where there feels to be a fullness, minimal tenderness, no guarding, no peritoneal signs, no tympanitic sounds to percussion.  There is no tenderness in the lower abdomen especially the right lower quadrant and there is no Murphy sign.  He has exploratory laparotomy scar is completely  healed  Musculoskeletal:        General: No tenderness. Normal range of motion.     Cervical back: Normal range of motion and neck supple.     Right lower leg: No edema.     Left lower leg: No edema.  Lymphadenopathy:     Cervical: No cervical adenopathy.  Skin:    General: Skin is warm and dry.     Findings: No erythema or rash.  Neurological:     Mental Status: He is alert.     Coordination: Coordination normal.  Psychiatric:  Behavior: Behavior normal.     ED Results / Procedures / Treatments   Labs (all labs ordered are listed, but only abnormal results are displayed) Labs Reviewed  COMPREHENSIVE METABOLIC PANEL - Abnormal; Notable for the following components:      Result Value   Potassium 3.1 (*)    Glucose, Bld 172 (*)    Calcium 8.4 (*)    Total Protein 5.9 (*)    Albumin 3.3 (*)    Total Bilirubin 1.8 (*)    All other components within normal limits  URINALYSIS, ROUTINE W REFLEX MICROSCOPIC - Abnormal; Notable for the following components:   Specific Gravity, Urine 1.032 (*)    All other components within normal limits  LIPASE, BLOOD  CBC WITH DIFFERENTIAL/PLATELET    EKG None  Radiology CT ABDOMEN PELVIS W CONTRAST  Result Date: 06/15/2022 CLINICAL DATA:  81 year old male with abdominal pain. History of bowel perforation in 2021. EXAM: CT ABDOMEN AND PELVIS WITH CONTRAST TECHNIQUE: Multidetector CT imaging of the abdomen and pelvis was performed using the standard protocol following bolus administration of intravenous contrast. RADIATION DOSE REDUCTION: This exam was performed according to the departmental dose-optimization program which includes automated exposure control, adjustment of the mA and/or kV according to patient size and/or use of iterative reconstruction technique. CONTRAST:  162m OMNIPAQUE IOHEXOL 300 MG/ML  SOLN COMPARISON:  CT Abdomen and Pelvis 06/21/2020. FINDINGS: Lower chest: Borderline cardiomegaly. No pericardial or pleural  effusion. Chronic lung base atelectasis or scarring. 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 Hepatobiliary: Trace perihepatic free fluid along the posterior right hepatic lobe. Rim calcified 2.2 cm gallstone in the neck of the gallbladder. No pericholecystic inflammation. No bile duct enlargement. Pancreas: Partially atrophy. Spleen: Negative. Adrenals/Urinary Tract: Negative. Stomach/Bowel: New ventral abdominal postoperative changes and bowel containing hernias (series 2, image 46). Herniated small bowel loop with a 3.4 cm hernia neck to the left of midline (series 6, image 93). And more leftward herniated small bowel loop with a smaller roughly 2 cm hernia neck (series 2, image 46). Multiple fluid-filled, dilated, and mildly inflamed small bowel loops throughout that region. Superimposed small bowel anastomosis on series 2, image 55. Stomach, duodenum and proximal jejunum are decompressed. Terminal ileum decompressed. Large bowel retained stool and diverticulosis. Normal appendix on Vascular/Lymphatic: Aortoiliac calcified atherosclerosis. Major arterial structures in the abdomen and pelvis appear patent. No lymphadenopathy. Portal venous system is patent. Reproductive: Negative. Other: No pelvic free fluid. Musculoskeletal: Mild chronic L3 compression fracture, L1 compression fracture are stable. No acute osseous abnormality identified. IMPRESSION: 1. Acute Small-bowel Obstruction appears secondary to two distinct incarcerated ventral abdominal hernias as seen on series 2, image 46. Underlying prior small bowel resection. Small volume of free fluid but no pneumoperitoneum. 2. Cholelithiasis. Large bowel diverticulosis. Aortic Atherosclerosis (ICD10-I70.0). Mild chronic lumbar compression fractures. Electronically Signed   By: HGenevie AnnM.D.   On: 06/15/2022 08:46    Procedures Procedures    Medications Ordered in ED Medications  0.9 %  sodium chloride infusion ( Intravenous New Bag/Given 06/15/22  0916)  fentaNYL (SUBLIMAZE) injection 50 mcg (has no administration in time range)  potassium chloride 10 mEq in 100 mL IVPB (10 mEq Intravenous New Bag/Given 06/15/22 0917)  iohexol (OMNIPAQUE) 300 MG/ML solution 100 mL (100 mLs Intravenous Contrast Given 06/15/22 0809)    ED Course/ Medical Decision Making/ A&P  Medical Decision Making Amount and/or Complexity of Data Reviewed Labs: ordered. Radiology: ordered.  Risk Prescription drug management. Decision regarding hospitalization.   This patient presents to the ED for concern of abdominal pain, sudden onset, this involves an extensive number of treatment options, and is a complaint that carries with it a high risk of complications and morbidity.  The differential diagnosis includes hernia, bowel obstruction especially given recent surgery, recurrent perforation   Co morbidities that complicate the patient evaluation  Morbid obesity, diabetes, hypertension, atrial fibrillation, anticoagulated   Additional history obtained:  Additional history obtained from electronic medical record External records from outside source obtained and reviewed including prior surgical notes and office visits   Lab Tests:  I Ordered, and personally interpreted labs.  The pertinent results include: No leukocytosis, metabolic panel is reassuring, urinalysis with high specific gravity but no signs of infection, metabolic panel showing some hyperglycemia at 172 and a potassium of 3.1 but otherwise unremarkable.  There is no anemia, normal lipase.   Imaging Studies ordered:  I ordered imaging studies including CT scan of the abdomen and pelvis I independently visualized and interpreted imaging which showed acute small bowel obstruction which appears to be related to 2 different incarcerated ventral abdominal hernias there is some free fluid but no pneumoperitoneum I agree with the radiologist interpretation   Cardiac  Monitoring: / EKG:  The patient was maintained on a cardiac monitor.  I personally viewed and interpreted the cardiac monitored which showed an underlying rhythm of: Normal sinus rhythm   Consultations Obtained:  I requested consultation with the general surgeon, Dr. Aviva Signs,  and discussed lab and imaging findings as well as pertinent plan - they recommend: They will come and see the patient, likely needs to be evaluated in person by the general surgeon for small bowel obstruction and hernia reduction.   Problem List / ED Course / Critical interventions / Medication management  With some palpation over the abdominal wall I tried to reduce the hernias however there are 2 very small hernias that are not palpated on exam, the patient has a very protuberant obese abdomen which makes it difficult to examine and to know exactly where the hernias are. Patient was given potassium for hypokalemia Fentanyl as needed for pain IV fluids for hydration and made n.p.o. I ordered medication including IV fluids and pain medication for bowel obstruction Reevaluation of the patient after these medicines showed that the patient stabilized I have reviewed the patients home medicines and have made adjustments as needed   Social Determinants of Health:  None   Test / Admission - Considered:  We will possibly need to be admitted to the hospital for bowel obstruction   After Dr. Arnoldo Morale was able to see the patient he was able to manually reduce both of the hernias at the bedside, the patient is not having any pain and he is not vomiting, Dr. Arnoldo Morale wishes for the patient to be discharged home to follow-up in the outpatient setting which is reasonable given the patient's lack of symptoms.  He is aware of the indications for return, stable for discharge at this time      Final Clinical Impression(s) / ED Diagnoses Final diagnoses:  SBO (small bowel obstruction) ([place])  Incarcerated ventral hernia   Hypokalemia    Rx / DC Orders ED Discharge Orders     None         Noemi Chapel, MD 06/15/22 0901    Noemi Chapel, MD 06/15/22  0948  

## 2022-06-15 NOTE — Discharge Instructions (Signed)
Thankfully the general surgeon was able to fix the hernia at the bedside.  This may occur again in the future so if you have severe pain that recurs or you develop vomiting return to the emergency department immediately.  I would like for you to follow-up with Dr. Constance Haw in the clinic within the next 2 weeks for a follow-up exam

## 2022-06-18 ENCOUNTER — Other Ambulatory Visit (HOSPITAL_COMMUNITY): Payer: Self-pay

## 2022-06-18 ENCOUNTER — Encounter: Payer: Self-pay | Admitting: General Surgery

## 2022-06-18 ENCOUNTER — Ambulatory Visit (INDEPENDENT_AMBULATORY_CARE_PROVIDER_SITE_OTHER): Payer: Medicare HMO | Admitting: General Surgery

## 2022-06-18 ENCOUNTER — Encounter (HOSPITAL_COMMUNITY)
Admission: RE | Admit: 2022-06-18 | Discharge: 2022-06-18 | Disposition: A | Discharge status: Home / Self Care | Payer: Medicare HMO | Source: Ambulatory Visit | Attending: Student | Admitting: Student

## 2022-06-18 VITALS — Wt 269.4 lb

## 2022-06-18 VITALS — BP 116/77 | HR 85 | Temp 97.9°F | Resp 18 | Ht 66.0 in | Wt 268.0 lb

## 2022-06-18 DIAGNOSIS — J432 Centrilobular emphysema: Secondary | ICD-10-CM | POA: Insufficient documentation

## 2022-06-18 DIAGNOSIS — K439 Ventral hernia without obstruction or gangrene: Secondary | ICD-10-CM | POA: Diagnosis not present

## 2022-06-18 NOTE — Patient Instructions (Signed)
I will message your Pulmonologist about timining and your breathing. Lets plan for middle/end of August to get you off the prednisone or at a lower dose.  Keep the hernias pushed in.  If you continue to have dark black stools, let us know.  Open Hernia Repair, Adult Open hernia repair is a surgical procedure to fix a hernia. A hernia occurs when an internal organ or tissue pushes through a weak spot in the muscles along the wall of the abdomen. Hernias commonly occur in the groin and around the belly button. Most hernias tend to get worse over time. Often, surgery is done to prevent the hernia from becoming bigger, uncomfortable, or an emergency. Emergency surgery may be needed if contents of the abdomen get stuck in the opening (incarcerated hernia) or if the blood supply gets cut off (strangulated hernia). In an open repair, an incision is made in the abdomen to perform the surgery. Tell a health care provider about: Any allergies you have. All medicines you are taking, including vitamins, herbs, eye drops, creams, and over-the-counter medicines. Any problems you or family members have had with anesthetic medicines. Any blood or bone disorders you have. Any surgeries you have had. Any medical conditions you have, including any recent cold or flu (influenza)symptoms. Whether you are pregnant or may be pregnant. What are the risks? Generally, this is a safe procedure. However, problems may occur, including: Long-lasting (chronic) pain. Bleeding. Infection. Damage to the testicles. This can cause shrinking or swelling. Damage to nearby structures or organs, including the bladder, blood vessels, intestines, or nerves near the hernia. Blood clots. Trouble passing urine. Return of the hernia. What happens before the procedure? Medicines Ask your health care provider about: Changing or stopping your regular medicines. This is especially important if you are taking diabetes medicines or blood  thinners. Taking medicines such as aspirin and ibuprofen. These medicines can thin your blood. Do not take these medicines unless your health care provider tells you to take them. Taking over-the-counter medicines, vitamins, herbs, and supplements. Surgery safety Ask your health care provider: How your surgery site will be marked. What steps will be taken to help prevent infection. These steps may include: Removing hair at the surgery site. Washing skin with a germ-killing soap. Receiving antibiotic medicine. General instructions You may have an exam or testing, such as blood tests or imaging studies. Do not use any products that contain nicotine or tobacco for at least 4 weeks before the procedure. These products include cigarettes, chewing tobacco, and vaping devices, such as e-cigarettes. If you need help quitting, ask your health care provider. Let your health care provider know if you develop a cold or any infection before your surgery. If you get an infection before surgery, you may receive antibiotics to treat it. Plan to have a responsible adult take you home from the hospital or clinic. If you will be going home right after the procedure, plan to have a responsible adult care for you for the time you are told. This is important. What happens during the procedure?  An IV will be inserted into one of your veins. You will be given one or more of the following: A medicine to help you relax (sedative). A medicine to numb the area (local anesthetic). A medicine to make you fall asleep (general anesthetic). Your surgeon will make an incision over the hernia. The tissues of the hernia will be moved back into place. The edges of the hernia may be stitched (sutured)  together. The opening in the abdominal muscles will be closed with stitches (sutures). Or, your surgeon will place a mesh patch made of artificial (synthetic) material over the opening. The incision will be closed with sutures,  skin glue, or adhesive strips. A bandage (dressing) may be placed over the incision. The procedure may vary among health care providers and hospitals. What happens after the procedure? Your blood pressure, heart rate, breathing rate, and blood oxygen level will be monitored until you leave the hospital or clinic. You may be given medicine for pain. If you were given a sedative during the procedure, it can affect you for several hours. Do not drive or operate machinery until your health care provider says that it is safe. Summary Open hernia repair is a surgical procedure to fix a hernia. Hernias commonly occur in the groin and around the belly button. Emergency surgery may be needed if contents of the abdomen get stuck in the opening (incarcerated hernia) or if the blood supply gets cut off (strangulated hernia). In this procedure, an incision is made in the abdomen to perform the surgery. After the procedure, you may be given medicine for pain. This information is not intended to replace advice given to you by your health care provider. Make sure you discuss any questions you have with your health care provider. Document Revised: 06/19/2020 Document Reviewed: 06/19/2020 Elsevier Patient Education  [place].

## 2022-06-18 NOTE — Progress Notes (Signed)
Daily Session Note  Patient Details  Name: Vincent Cooper MRN: [id number] Date of Birth: Jan 13, 1941 Referring Provider:   Flowsheet Row PULMONARY REHAB OTHER RESP ORIENTATION from 06/03/2022 in [place]  Referring Provider Dr. Verlee Monte       Encounter Date: 06/18/2022  Check In:  Session Check In - 06/18/22 1330       Check-In   Supervising physician immediately available to respond to emergencies [facility] MD immediately available    Physician(s) Dr. Gardiner Rhyme    Location [facility]    Staff Present Hoy Register, MS, ACSM-CEP, Exercise Physiologist;Debra Wynetta Emery, RN, BSN;Heather Otho Ket, BS, Exercise Physiologist    Virtual Visit No    Medication changes reported     No    Fall or balance concerns reported    Yes    Comments Has not fallen, but he uses a walking cane for support. He admits to losing his balance but not falling.    Tobacco Cessation No Change    Warm-up and Cool-down Performed as group-led instruction    Resistance Training Performed Yes    VAD Patient? No    PAD/SET Patient? No      Pain Assessment   Currently in Pain? No/denies    Pain Score 4     Pain Location Abdomen    Pain Orientation Lower;Right    Pain Descriptors / Indicators Aching    Pain Type Acute pain    Pain Frequency Intermittent    Multiple Pain Sites No             Capillary Blood Glucose: No results found for this or any previous visit (from the past 24 hour(s)).    Social History   Tobacco Use  Smoking Status Former   Packs/day: 2.00   Years: 55.00   Total pack years: 110.00   Types: Cigarettes   Quit date: 11/18/2001   Years since quitting: 20.5  Smokeless Tobacco Former    Goals Met:  Proper associated with RPD/PD & O2 Sat Independence with exercise equipment Using PLB without cueing & demonstrates good technique Exercise tolerated well Queuing for purse lip breathing No report of concerns or symptoms today Strength training  completed today  Goals Unmet:  Not Applicable  Comments: checkout time is 1430   Dr. Kathie Dike is Medical Director for [facility].

## 2022-06-18 NOTE — Progress Notes (Signed)
[facility] History and Physical  Reason for Referral:*** Referring Physician: ***  Chief Complaint   Follow-up     Vincent Cooper is a 81 y.o. male.  HPI: ***.  The *** started *** and has had a duration of ***.  It is associated with ***.  The *** is improved with ***, and is made worse with ***.    Quality*** Context***  Past Medical History:  Diagnosis Date  . Chronic gout   . COPD (chronic obstructive pulmonary disease) ([place])    Oxygen dependent  . Coronary artery disease due to calcified coronary lesion 03/08/2020   CORONARY CTA: Cor Ca++ Score 1651 !!: ~ 50% LM (CTFFR 1 - NS). Large Dom RCA-<PDA-PAV/PL - diffuse mild-mod plaque: prox (25-49%), mid (50-69%) CTFFR (p 0.99, m 0.85, d 0.81 - NS).  Med-Size LAD - prox-mid long diffuse Mod-Severe Ca++ plaque (~50-69%, ?>70%): CTFFR p 0.95, m 0.88, d 0.86 -NS.  CTFFR LCx 0.94, OM1 0.90 - NS. (NS=Not Significant).  . Diabetes mellitus type II, non insulin dependent ([place])   . Hypertension   . Morbid obesity (HCC)    BMI of 38.5 with multiple risk factors.  . OSA on CPAP   . Pulmonary emboli ([place]) 10/2019   Chest CTA-4 Phadke opacification of main PA but there is partially occlusive main posterior RLL and segmental/segmental branches.  Small thrombus noted in the anterior right middle lobe.  No RV strain.  Scattered aortic atherosclerosis involving great vessels.  Coronary calcification noted.  Marland Kitchen RLS (restless legs syndrome)     Past Surgical History:  Procedure Laterality Date  . BOWEL RESECTION N/A 10/18/2020   Procedure: SMALL BOWEL RESECTION;  Surgeon: Virl Cagey, MD;  Location: [facility];  Service: General;  Laterality: N/A;  . Cataract surgery Right   . COLONOSCOPY WITH PROPOFOL N/A 09/27/2020   Procedure: COLONOSCOPY WITH PROPOFOL;  Surgeon: Rogene Houston, MD;  Location: [facility];  Service: Endoscopy;  Laterality: N/A;  1:15  . COLOSTOMY REVERSAL N/A 10/18/2020   Procedure: COLOSTOMY REVERSAL;   Surgeon: Virl Cagey, MD;  Location: [facility];  Service: General;  Laterality: N/A;  . LAPAROTOMY N/A 06/21/2020   Procedure: EXPLORATORY LAPAROTOMY,  bowel resection, creation ostomy;  Surgeon: Virl Cagey, MD;  Location: [facility];  Service: General;  Laterality: N/A;  . POLYPECTOMY  09/27/2020   Procedure: POLYPECTOMY;  Surgeon: Rogene Houston, MD;  Location: [facility];  Service: Endoscopy;;  . TRANSTHORACIC ECHOCARDIOGRAM  10/20/2020   EF 60 to 65%.  Moderate LVH at least GR 1 DD.  Mild to moderate LA dilation.  Moderate aortic valve calcification-sclerosis with no stenosis.  Normal RAP.    Family History  Problem Relation Age of Onset  . Colon cancer Mother   . Emphysema Father        smoked  . Diverticulitis Sister   . Diabetes Brother   . Diabetes Brother   . Heart Problems Maternal Grandmother   . Breast cancer Daughter   . Lupus Son   . CAD Neg Hx     Social History   Tobacco Use  . Smoking status: Former    Packs/day: 2.00    Years: 55.00    Total pack years: 110.00    Types: Cigarettes    Quit date: 11/18/2001    Years since quitting: 20.5  . Smokeless tobacco: Former  Media planner  . Vaping Use: Never used  Substance Use Topics  . Alcohol use: Not  Currently  . Drug use: Never    Medications: {medication reviewed/display:3041432} Allergies as of 06/18/2022       Reactions   Other    Patient reports he was allergic to something in an IV he was given but does not know what the substance was. As of 01/19/2020         Medication List        Accurate as of June 18, 2022  8:51 AM. If you have any questions, ask your nurse or doctor.          AeroChamber MV inhaler Use as instructed   albuterol 108 (90 Base) MCG/ACT inhaler Commonly known as: VENTOLIN HFA Inhale 1 puff into the lungs every 6 (six) hours as needed for wheezing or shortness of breath.   betamethasone dipropionate 0.05 % cream Apply 1 Application topically daily as needed  (irritation).   Breztri Aerosphere 160-9-4.8 MCG/ACT Aero Generic drug: Budeson-Glycopyrrol-Formoterol Inhale 2 puffs into the lungs in the morning and at bedtime.   diltiazem 180 MG 24 hr capsule Commonly known as: CARDIZEM CD Take 360 mg by mouth every morning.   docusate sodium 100 MG capsule Commonly known as: COLACE Take 100 mg by mouth daily.   Eliquis 2.5 MG Tabs tablet Generic drug: apixaban Take 2.5 mg by mouth 2 (two) times daily.   ipratropium-albuterol 0.5-2.5 (3) MG/3ML Soln Commonly known as: DUONEB Take 3 mLs by nebulization every 6 (six) hours as needed (shortness of breath).   metoprolol tartrate 25 MG tablet Commonly known as: LOPRESSOR Take 1 tablet (25 mg total) by mouth 2 (two) times daily.   nitroGLYCERIN 0.4 MG SL tablet Commonly known as: NITROSTAT Place 0.4 mg under the tongue every 5 (five) minutes as needed for chest pain.   Nucala 100 MG/ML Soaj Generic drug: Mepolizumab Inject 1 mL (100 mg total) into the skin every 28 (twenty-eight) days.   potassium chloride SA 20 MEQ tablet Commonly known as: KLOR-CON M Take 20 mEq by mouth daily.   predniSONE 1 MG tablet Commonly known as: DELTASONE Take 5 tablets daily for 1 week, 4 tablets daily for 1 week, 3 tablets daily for 1 week, 2 tablets daily for 1 week, 1 tablet daily for one week then stop What changed: Another medication with the same name was removed. Continue taking this medication, and follow the directions you see here. Changed by: Virl Cagey, MD   rosuvastatin 40 MG tablet Commonly known as: CRESTOR TAKE 1 TABLET BY MOUTH ONCE DAILY. What changed: when to take this   tamsulosin 0.4 MG Caps capsule Commonly known as: FLOMAX Take 1 capsule (0.4 mg total) by mouth daily. What changed: when to take this   torsemide 20 MG tablet Commonly known as: DEMADEX Take 1 tablet (20 mg total) by mouth 2 (two) times daily. May take an extra 20 mg if needed for increase swelling What  changed:  how much to take when to take this additional instructions         ROS:  {Review of Systems:30496}  Blood pressure 116/77, pulse 85, temperature 97.9 F (36.6 C), temperature source Oral, resp. rate 18, height '5\' 6"'$  (1.676 m), weight 268 lb (121.6 kg), SpO2 95 %. Physical Exam  Results: No results found for this or any previous visit (from the past 48 hour(s)).  No results found.   Assessment & Plan:  Vincent Cooper is a 81 y.o. male with *** -*** -*** -Follow up *** Future Appointments  Date Time  Provider [place]  06/18/2022  1:30 PM [facility]  06/20/2022  1:30 PM [facility] RM [facility]  06/25/2022  1:30 PM [facility]ULMON[facility] RM [facility]  06/27/2022  1:30 PM [facility]  07/02/2022  1:30 PM [facility]  07/04/2022  1:30 PM [facility]  07/09/2022  1:30 PM [facility]  07/11/2022  1:30 PM [facility]  07/16/2022  1:30 PM [facility]  07/18/2022  1:30 PM [facility]  07/23/2022  1:30 PM [facility]  07/25/2022  1:30 PM [facility] RM [facility]  07/30/2022  1:30 PM [facility] RM [facility]  08/01/2022  1:30 PM [facility]ULMON[facility] RM [facility]  08/02/2022  1:30 PM Maryjane Hurter, MD [facility] None  08/06/2022  1:30 PM [facility] RM [facility]  08/08/2022  1:30 PM [facility]  08/13/2022  1:30 PM [facility]  08/15/2022  1:30 PM [facility]  08/20/2022  1:30 PM [facility]  08/22/2022  1:30 PM [facility]  08/27/2022  1:30 PM [facility] RM [facility]  08/29/2022  1:30 PM [facility]UL[facility] RM [facility]   09/03/2022  1:30 PM [facility]UL[facility] RM [facility]  09/05/2022  1:30 PM [facility]  09/10/2022  1:30 PM [facility]  09/12/2022  1:30 PM [facility]  09/17/2022  1:30 PM [facility]  09/19/2022  1:30 PM [facility]  09/24/2022  1:30 PM [facility]  09/26/2022  1:30 PM [facility]  10/01/2022  1:30 PM [facility]  10/03/2022  1:30 PM [facility]  10/08/2022  1:30 PM [facility]     All questions were answered to the satisfaction of the patient and family***.  The risk and benefits of *** were discussed including but not limited to ***.  After careful consideration, Vincent Cooper has decided to ***.    Virl Cagey 06/18/2022, 8:51 AM

## 2022-06-20 ENCOUNTER — Other Ambulatory Visit (HOSPITAL_COMMUNITY): Payer: Self-pay

## 2022-06-20 ENCOUNTER — Encounter (HOSPITAL_COMMUNITY)
Admission: RE | Admit: 2022-06-20 | Discharge: 2022-06-20 | Disposition: A | Discharge status: Home / Self Care | Payer: Medicare HMO | Source: Ambulatory Visit | Attending: Student | Admitting: Student

## 2022-06-20 DIAGNOSIS — J432 Centrilobular emphysema: Secondary | ICD-10-CM | POA: Diagnosis not present

## 2022-06-20 NOTE — Progress Notes (Signed)
Daily Session Note  Patient Details  Name: Merwin Breden MRN: [id number] Date of Birth: 1941-06-02 Referring Provider:   Flowsheet Row PULMONARY REHAB OTHER RESP ORIENTATION from 06/03/2022 in [place]  Referring Provider Dr. Verlee Monte       Encounter Date: 06/20/2022  Check In:  Session Check In - 06/20/22 1330       Check-In   Supervising physician immediately available to respond to emergencies [facility] MD immediately available    Physician(s) Dr Johney Frame    Location [facility]    Staff Present Hoy Register, MS, ACSM-CEP, Exercise Physiologist;Phyllis Billingsley, RN;Deanta Mincey Hassell Done, RN, BSN    Virtual Visit No    Medication changes reported     No    Fall or balance concerns reported    Yes    Comments Has not fallen, but he uses a walking cane for support. He admits to losing his balance but not falling.    Tobacco Cessation No Change    Warm-up and Cool-down Performed as group-led instruction    Resistance Training Performed Yes    VAD Patient? No    PAD/SET Patient? No      Pain Assessment   Currently in Pain? No/denies    Pain Score 0-No pain    Multiple Pain Sites No             Capillary Blood Glucose: No results found for this or any previous visit (from the past 24 hour(s)).    Social History   Tobacco Use  Smoking Status Former   Packs/day: 2.00   Years: 55.00   Total pack years: 110.00   Types: Cigarettes   Quit date: 11/18/2001   Years since quitting: 20.6  Smokeless Tobacco Former    Goals Met:  Proper associated with RPD/PD & O2 Sat Independence with exercise equipment Using PLB without cueing & demonstrates good technique Exercise tolerated well Queuing for purse lip breathing No report of concerns or symptoms today Strength training completed today  Goals Unmet:  Not Applicable  Comments: checkout at 1430.   Dr. Kathie Dike is Medical Director for [facility] Pulmonary [facility].

## 2022-06-24 ENCOUNTER — Other Ambulatory Visit (HOSPITAL_COMMUNITY): Payer: Self-pay

## 2022-06-25 ENCOUNTER — Encounter (HOSPITAL_COMMUNITY)
Admission: RE | Admit: 2022-06-25 | Discharge: 2022-06-25 | Disposition: A | Discharge status: Home / Self Care | Payer: Medicare HMO | Source: Ambulatory Visit | Attending: Student | Admitting: Student

## 2022-06-25 DIAGNOSIS — J432 Centrilobular emphysema: Secondary | ICD-10-CM

## 2022-06-25 NOTE — Progress Notes (Signed)
Daily Session Note  Patient Details  Name: Vincent Cooper MRN: [id number] Date of Birth: 1941/02/10 Referring Provider:   Flowsheet Row PULMONARY REHAB OTHER RESP ORIENTATION from 06/03/2022 in [place]  Referring Provider Dr. Verlee Monte       Encounter Date: 06/25/2022  Check In:  Session Check In - 06/25/22 1330       Check-In   Supervising physician immediately available to respond to emergencies [facility] MD immediately available    Physician(s) Dr Phineas Inches    Location [facility]    Staff Present Geanie Cooley, RN;Darnelle Derrick Hassell Done, RN, BSN;Heather Otho Ket, BS, Exercise Physiologist    Virtual Visit No    Medication changes reported     No    Fall or balance concerns reported    Yes    Comments Has not fallen, but he uses a walking cane for support. He admits to losing his balance but not falling.    Warm-up and Cool-down Performed as group-led Higher education careers adviser Performed Yes    VAD Patient? No    PAD/SET Patient? No      Pain Assessment   Currently in Pain? No/denies    Pain Score 0-No pain    Multiple Pain Sites No             Capillary Blood Glucose: No results found for this or any previous visit (from the past 24 hour(s)).    Social History   Tobacco Use  Smoking Status Former   Packs/day: 2.00   Years: 55.00   Total pack years: 110.00   Types: Cigarettes   Quit date: 11/18/2001   Years since quitting: 20.6  Smokeless Tobacco Former    Goals Met:  Proper associated with RPD/PD & O2 Sat Independence with exercise equipment Using PLB without cueing & demonstrates good technique Exercise tolerated well Queuing for purse lip breathing No report of concerns or symptoms today Strength training completed today  Goals Unmet:  Not Applicable  Comments: checkout at 1430.   Dr. Kathie Dike is Medical Director for [facility] Pulmonary Rehab.

## 2022-06-27 ENCOUNTER — Encounter (HOSPITAL_COMMUNITY)
Admission: RE | Admit: 2022-06-27 | Discharge: 2022-06-27 | Disposition: A | Discharge status: Home / Self Care | Payer: Medicare HMO | Source: Ambulatory Visit | Attending: Student | Admitting: Student

## 2022-06-27 DIAGNOSIS — J432 Centrilobular emphysema: Secondary | ICD-10-CM | POA: Diagnosis not present

## 2022-06-27 NOTE — Progress Notes (Signed)
Daily Session Note  Patient Details  Name: Vincent Cooper MRN: [id number] Date of Birth: February 02, 1941 Referring Provider:   Flowsheet Row PULMONARY REHAB OTHER RESP ORIENTATION from 06/03/2022 in [place]  Referring Provider Dr. Verlee Monte       Encounter Date: 06/27/2022  Check In:  Session Check In - 06/27/22 1330       Check-In   Supervising physician immediately available to respond to emergencies [facility] MD immediately available    Physician(s) Dr. Harl Bowie    Location [facility]    Staff Present Geanie Cooley, RN;Heather Otho Ket, BS, Exercise Physiologist;Debra Wynetta Emery, RN, Bjorn Loser, MS, ACSM-CEP, Exercise Physiologist    Virtual Visit No    Medication changes reported     No    Fall or balance concerns reported    Yes    Comments Has not fallen, but he uses a walking cane for support. He admits to losing his balance but not falling.    Tobacco Cessation No Change    Warm-up and Cool-down Performed as group-led instruction    Resistance Training Performed Yes    VAD Patient? No    PAD/SET Patient? No      Pain Assessment   Currently in Pain? No/denies    Pain Score 0-No pain    Multiple Pain Sites No             Capillary Blood Glucose: No results found for this or any previous visit (from the past 24 hour(s)).    Social History   Tobacco Use  Smoking Status Former   Packs/day: 2.00   Years: 55.00   Total pack years: 110.00   Types: Cigarettes   Quit date: 11/18/2001   Years since quitting: 20.6  Smokeless Tobacco Former    Goals Met:  Proper associated with RPD/PD & O2 Sat Using PLB without cueing & demonstrates good technique No report of concerns or symptoms today Strength training completed today  Goals Unmet:  Not Applicable  Comments: check out @ 2:30pm   Dr. Kathie Dike is Medical Director for [facility] Pulmonary Rehab.

## 2022-06-28 ENCOUNTER — Other Ambulatory Visit (HOSPITAL_COMMUNITY): Payer: Self-pay

## 2022-07-01 ENCOUNTER — Other Ambulatory Visit (HOSPITAL_COMMUNITY): Payer: Self-pay

## 2022-07-02 ENCOUNTER — Encounter (HOSPITAL_COMMUNITY)
Admission: RE | Admit: 2022-07-02 | Discharge: 2022-07-02 | Disposition: A | Discharge status: Home / Self Care | Payer: Medicare HMO | Source: Ambulatory Visit | Attending: Student | Admitting: Student

## 2022-07-02 VITALS — Wt 272.5 lb

## 2022-07-02 DIAGNOSIS — J432 Centrilobular emphysema: Secondary | ICD-10-CM | POA: Diagnosis not present

## 2022-07-02 NOTE — Progress Notes (Signed)
Daily Session Note  Patient Details  Name: Vincent Cooper MRN: [id number] Date of Birth: 1941-08-25 Referring Provider:   Flowsheet Row PULMONARY REHAB OTHER RESP ORIENTATION from 06/03/2022 in [place]  Referring Provider Dr. Verlee Monte       Encounter Date: 07/02/2022  Check In:  Session Check In - 07/02/22 1330       Check-In   Supervising physician immediately available to respond to emergencies [facility] MD immediately available    Physician(s) Dr. Domenic Polite    Location [facility]    Staff Present Geanie Cooley, RN;Heather Otho Ket, BS, Exercise Physiologist;Dalton Kris Mouton, MS, ACSM-CEP, Exercise Physiologist    Virtual Visit No    Medication changes reported     No    Fall or balance concerns reported    Yes    Comments Has not fallen, but he uses a walking cane for support. He admits to losing his balance but not falling.    Tobacco Cessation No Change    Warm-up and Cool-down Performed as group-led instruction    Resistance Training Performed Yes    VAD Patient? No    PAD/SET Patient? No      Pain Assessment   Currently in Pain? No/denies    Pain Score 0-No pain    Multiple Pain Sites No             Capillary Blood Glucose: No results found for this or any previous visit (from the past 24 hour(s)).    Social History   Tobacco Use  Smoking Status Former   Packs/day: 2.00   Years: 55.00   Total pack years: 110.00   Types: Cigarettes   Quit date: 11/18/2001   Years since quitting: 20.6  Smokeless Tobacco Former    Goals Met:  Proper associated with RPD/PD & O2 Sat Independence with exercise equipment Improved SOB with ADL's Exercise tolerated well No report of concerns or symptoms today Strength training completed today  Goals Unmet:  Not Applicable  Comments: checkout @ 2:30pm   Dr. Kathie Dike is Medical Director for [facility] Pul[facility] Re[facility].

## 2022-07-03 NOTE — Progress Notes (Signed)
Pulmonary Individual Treatment Plan  Patient Details  Name: Vincent Cooper MRN: [id number] Date of Birth: [birth year] Referring Provider:   Flowsheet Row PULMONARY REHAB OTHER RESP ORIENTATION from 06/03/2022 in [place]  Referring Provider Dr. 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[place])  Patient's Home Medications on Admission:   Current Outpatient Medications:    albuterol (VENTOLIN HFA) 108 (90 Base) MCG/ACT inhaler, Inhale 1 puff into the lungs every 6 (six) hours as needed for wheezing or shortness of breath., Disp: , Rfl:    apixaban (ELIQUIS) 2.5 MG TABS tablet, Take 2.5 mg by mouth 2 (two) times daily., Disp: , Rfl:    betamethasone dipropionate 0.05 % cream, Apply 1 Application topically daily as needed (irritation)., Disp: , Rfl:    Budeson-Glycopyrrol-Formoterol (BREZTRI AEROSPHERE) 160-9-4.8 MCG/ACT AERO, Inhale 2 puffs into the lungs in the morning and at bedtime., Disp: 10.7 g, Rfl: 11   diltiazem (CARDIZEM CD) 180 MG 24 hr capsule, Take 360 mg by mouth every morning., Disp: , Rfl:    docusate sodium (COLACE) 100 MG capsule, Take 100 mg by mouth daily., Disp: , Rfl:    ipratropium-albuterol (DUONEB) 0.5-2.5 (3) MG/3ML SOLN, Take 3 mLs by nebulization every 6 (six) hours as needed (shortness of breath). , Disp: , Rfl:    Mepolizumab (NUCALA) 100 MG/ML SOAJ, Inject 1 mL (100 mg total) into the skin every 28 (twenty-eight) days., Disp: 3 mL, Rfl: 1   metoprolol tartrate (LOPRESSOR) 25 MG tablet, Take 1 tablet (25 mg total) by mouth 2 (two) times daily., Disp: 60 tablet, Rfl: 3   nitroGLYCERIN (NITROSTAT) 0.4 MG SL tablet, Place 0.4 mg under the tongue every 5 (five) minutes as needed for chest pain., Disp: , Rfl:    potassium chloride SA (KLOR-CON) 20 MEQ tablet, Take 20 mEq by mouth  daily., Disp: , Rfl:    predniSONE (DELTASONE) 1 MG tablet, Take 5 tablets daily for 1 week, 4 tablets daily for 1 week, 3 tablets daily for 1 week, 2 tablets daily for 1 week, 1 tablet daily for one week then stop, Disp: 105 tablet, Rfl: 0   rosuvastatin (CRESTOR) 40 MG tablet, TAKE 1 TABLET BY MOUTH ONCE DAILY. (Patient taking differently: Take 40 mg by mouth at bedtime.), Disp: 90 tablet, Rfl: 1   Spacer/Aero-Holding Chambers (AEROCHAMBER MV) inhaler, Use as instructed, Disp: 1 each, Rfl: 0   tamsulosin (FLOMAX) 0.4 MG CAPS capsule, Take 1 capsule (0.4 mg total) by mouth daily. (Patient taking differently: Take 0.4 mg by mouth at bedtime.), Disp: 30 capsule, Rfl: 1   torsemide (DEMADEX) 20 MG tablet, Take 1 tablet (20 mg total) by mouth 2 (two) times daily. May take an extra 20 mg if needed for increase swelling (Patient taking differently: Take 20-40 mg by mouth See admin instructions. 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 Large Dom RCA-<PDA-PAV/PL - diffuse mild-mod plaque: prox (25-49%), mid (50-69%) CTFFR (p 0.99, m 0.85, d 0.81 -  NS).  Med-Size LAD - prox-mid long diffuse Mod-Severe Ca++ plaque (~50-69%, ?>70%): CTFFR p 0.95, m 0.88, d 0.86 -NS.  CTFFR LCx 0.94, OM1 0.90 - NS. (NS=Not Significant).   Diabetes mellitus type II, non insulin dependent (HCC)    Hypertension    Morbid obesity (HCC)    BMI of 38.5 with multiple risk factors.   OSA on CPAP    Pulmonary emboli ([place]) 10/2019   Chest CTA-4 [place] opacification of main PA but there is partially occlusive main posterior RLL and segmental/segmental branches.  Small thrombus noted in the anterior right middle lobe.  No RV  strain.  Scattered aortic atherosclerosis involving great vessels.  Coronary calcification noted.   RLS (restless legs syndrome)     Tobacco Use: Social History   Tobacco Use  Smoking Status Former   Packs/day: 2.00   Years: 55.00   Total pack years: 110.00   Types: Cigarettes   Quit date: 11/18/2001   Years since quitting: 20.6  Smokeless Tobacco Former    Labs: Review Flowsheet  More data exists      Latest Ref Rng & Units 03/20/2020 06/17/2020 06/22/2020 06/23/2020 10/16/2020  Labs for ITP Cardiac and Pulmonary Rehab  Cholestrol 100 - 199 mg/dL 264  - - - -  LDL (calc) 0 - 99 mg/dL 135  - - - -  HDL-C >39 mg/dL 99  - - - -  Trlycerides <150 mg/dL 176  - 151  - -  Hemoglobin A1c 4.8 - 5.6 % - 5.4  - - 4.7   PH, Arterial 7.350 - 7.450 - - 7.421  7.399  -  PCO2 arterial 32.0 - 48.0 mmHg - - 36.4  39.3  -  Bicarbonate 20.0 - 28.0 mmol/L - - - 24.1  -  Acid-base deficit 0.0 - 2.0 mmol/L - - 0.6  0.4  -  O2 Saturation % - - >100.0  97.4  -    Capillary Blood Glucose: Lab Results  Component Value Date   GLUCAP 143 (H) 10/23/2020   GLUCAP 115 (H) 10/23/2020   GLUCAP 113 (H) 10/23/2020   GLUCAP 120 (H) 10/22/2020   GLUCAP 142 (H) 10/22/2020     Pulmonary Assessment Scores:  Pulmonary Assessment Scores     Row Name 06/03/22 1243         ADL UCSD   ADL Phase Entry     SOB Score total 50     Rest 0     Walk 2     Stairs 4     Bath 3     Dress 3     Shop 2       CAT Score   CAT Score 8       mMRC Score   mMRC Score 4             UCSD: Self-administered rating of dyspnea associated with activities of daily living (ADLs) 6-point scale (0 = "not at all" to 5 = "maximal or unable to do because of breathlessness")  Scoring Scores range from 0 to 120.  Minimally important difference is 5 units  CAT: CAT can identify the health impairment of COPD patients and is better correlated with disease progression.  CAT has a scoring range of zero to 40. The CAT score is  classified into four groups of low (less than 10), medium (10 - 20), high (21-30) and very high (31-40) based on the impact level of disease on health status. A CAT score over  10 suggests significant symptoms.  A worsening CAT score could be explained by an exacerbation, poor medication adherence, poor inhaler technique, or progression of COPD or comorbid conditions.  CAT MCID is 2 points  mMRC: mMRC (Modified Medical Research Council) Dyspnea Scale is used to assess the degree of baseline functional disability in patients of respiratory disease due to dyspnea. No minimal important difference is established. A decrease in score of 1 point or greater is considered a positive change.   Pulmonary Function Assessment:   Exercise Target Goals: Exercise Program Goal: Individual exercise prescription set using results from initial 6 min walk test and THRR while considering  patient's activity barriers and safety.   Exercise Prescription Goal: Initial exercise prescription builds to 30-45 minutes a day of aerobic activity, 2-3 days per week.  Home exercise guidelines will be given to patient during program as part of exercise prescription that the participant will acknowledge.  Activity Barriers & Risk Stratification:  Activity Barriers & Cardiac Risk Stratification - 06/03/22 1302       Activity Barriers & Cardiac Risk Stratification   Activity Barriers Deconditioning;Shortness of Breath;Assistive Device    Cardiac Risk Stratification High             6 Minute Walk:  6 Minute Walk     Row Name 06/03/22 1304         6 Minute Walk   Phase Initial     Distance 600 feet     Walk Time 6 minutes     # of Rest Breaks 1     MPH 1.13     METS 0.56     RPE 12     Perceived Dyspnea  11     VO2 Peak 1.99     Symptoms Yes (comment)     Comments One sitting break for 1 minute due to pt feeling weak and tired     Resting HR 85 bpm     Resting BP 112/76     Resting Oxygen Saturation  95  %     Exercise Oxygen Saturation  during 6 min walk 93 %     Max Ex. HR 126 bpm     Max Ex. BP 120/70     2 Minute Post BP 110/68       Interval HR   1 Minute HR 124     2 Minute HR 124     3 Minute HR 120     4 Minute HR 128     5 Minute HR 120     6 Minute HR 126     2 Minute Post HR 97     Interval Heart Rate? Yes       Interval Oxygen   Interval Oxygen? Yes     Baseline Oxygen Saturation % 95 %     1 Minute Oxygen Saturation % 92 %     1 Minute Liters of Oxygen 0 L     2 Minute Oxygen Saturation % 93 %     2 Minute Liters of Oxygen 0 L     3 Minute Oxygen Saturation % 94 %     3 Minute Liters of Oxygen 0 L     4 Minute Oxygen Saturation % 95 %     4 Minute Liters of Oxygen 0 L     5 Minute Oxygen Saturation % 94 %     5 Minute Liters of Oxygen 0 L     6  Minute Oxygen Saturation % 93 %     6 Minute Liters of Oxygen 0 L     2 Minute Post Oxygen Saturation % 96 %     2 Minute Post Liters of Oxygen 0 L              Oxygen Initial Assessment:  Oxygen Initial Assessment - 06/03/22 1305       Home Oxygen   Home Oxygen Device None    Sleep Oxygen Prescription CPAP    Home Exercise Oxygen Prescription None    Home Resting Oxygen Prescription None    Compliance with Home Oxygen Use Yes      Initial 6 min Walk   Oxygen Used None      Program Oxygen Prescription   Program Oxygen Prescription None      Intervention   Short Term Goals To learn and understand importance of monitoring SPO2 with pulse oximeter and demonstrate accurate use of the pulse oximeter.;To learn and understand importance of maintaining oxygen saturations>88%;To learn and demonstrate proper pursed lip breathing techniques or other breathing techniques.     Long  Term Goals Verbalizes importance of monitoring SPO2 with pulse oximeter and return demonstration;Maintenance of O2 saturations>88%;Exhibits proper breathing techniques, such as pursed lip breathing or other method taught during program  session;Compliance with respiratory medication             Oxygen Re-Evaluation:  Oxygen Re-Evaluation     Row Name 07/02/22 1429             Program Oxygen Prescription   Program Oxygen Prescription None         Home Oxygen   Home Oxygen Device None       Sleep Oxygen Prescription CPAP       Home Exercise Oxygen Prescription None       Compliance with Home Oxygen Use Yes         Goals/Expected Outcomes   Short Term Goals To learn and understand importance of monitoring SPO2 with pulse oximeter and demonstrate accurate use of the pulse oximeter.;To learn and understand importance of maintaining oxygen saturations>88%;To learn and demonstrate proper pursed lip breathing techniques or other breathing techniques.        Long  Term Goals Verbalizes importance of monitoring SPO2 with pulse oximeter and return demonstration;Maintenance of O2 saturations>88%;Exhibits proper breathing techniques, such as pursed lip breathing or other method taught during program session;Compliance with respiratory medication       Goals/Expected Outcomes compliance                Oxygen Discharge (Final Oxygen Re-Evaluation):  Oxygen Re-Evaluation - 07/02/22 1429       Program Oxygen Prescription   Program Oxygen Prescription None      Home Oxygen   Home Oxygen Device None    Sleep Oxygen Prescription CPAP    Home Exercise Oxygen Prescription None    Compliance with Home Oxygen Use Yes      Goals/Expected Outcomes   Short Term Goals To learn and understand importance of monitoring SPO2 with pulse oximeter and demonstrate accurate use of the pulse oximeter.;To learn and understand importance of maintaining oxygen saturations>88%;To learn and demonstrate proper pursed lip breathing techniques or other breathing techniques.     Long  Term Goals Verbalizes importance of monitoring SPO2 with pulse oximeter and return demonstration;Maintenance of O2 saturations>88%;Exhibits proper breathing  techniques, such as pursed lip breathing or other method taught during program session;Compliance with respiratory medication  Goals/Expected Outcomes compliance             Initial Exercise Prescription:  Initial Exercise Prescription - 06/03/22 1300       Date of Initial Exercise RX and Referring Provider   Date 06/03/22    Referring Provider Dr. Verlee Monte    Expected Discharge Date 10/08/22      NuStep   Level 1    SPM 60    Minutes 17      Arm Ergometer   Level 1    RPM 30    Minutes 22      Prescription Details   Frequency (times per week) 2    Duration Progress to 30 minutes of continuous aerobic without signs/symptoms of physical distress      Intensity   THRR 40-80% of Max Heartrate 56-112    Ratings of Perceived Exertion 11-13    Perceived Dyspnea 0-4      Resistance Training   Training Prescription Yes    Weight 3    Reps 10-15             Perform Capillary Blood Glucose checks as needed.  Exercise Prescription Changes:   Exercise Prescription Changes     Row Name 06/18/22 1500 07/02/22 1400           Response to Exercise   Blood Pressure (Admit) 132/62 120/62      Blood Pressure (Exercise) 140/70 128/72      Blood Pressure (Exit) 120/70 104/70      Heart Rate (Admit) 98 bpm 96 bpm      Heart Rate (Exercise) 116 bpm 118 bpm      Heart Rate (Exit) 92 bpm 98 bpm      Oxygen Saturation (Admit) 94 % 96 %      Oxygen Saturation (Exercise) 94 % 94 %      Oxygen Saturation (Exit) 94 % 96 %      Rating of Perceived Exertion (Exercise) 12 12      Perceived Dyspnea (Exercise) 11 12      Duration Continue with 30 min of aerobic exercise without signs/symptoms of physical distress. Continue with 30 min of aerobic exercise without signs/symptoms of physical distress.      Intensity THRR unchanged THRR unchanged        Progression   Progression Continue to progress workloads to maintain intensity without signs/symptoms of physical distress.  Continue to progress workloads to maintain intensity without signs/symptoms of physical distress.        Resistance Training   Training Prescription Yes Yes      Weight 3 3      Reps 10-15 10-15      Time 10 Minutes 10 Minutes        NuStep   Level 1 2      SPM 74 85      Minutes 22 22      METs 1.8 1.8        Arm Ergometer   Level 1 2      RPM 38 43      Minutes 17 17      METs 1.4 1.4               Exercise Comments:   Exercise Goals and Review:   Exercise Goals     Row Name 07/02/22 1427             Exercise Goals   Increase Physical Activity Yes  Intervention Provide advice, education, support and counseling about physical activity/exercise needs.;Develop an individualized exercise prescription for aerobic and resistive training based on initial evaluation findings, risk stratification, comorbidities and participant's personal goals.       Expected Outcomes Short Term: Attend rehab on a regular basis to increase amount of physical activity.;Long Term: Add in home exercise to make exercise part of routine and to increase amount of physical activity.;Long Term: Exercising regularly at least 3-5 days a week.       Increase Strength and Stamina Yes       Intervention Develop an individualized exercise prescription for aerobic and resistive training based on initial evaluation findings, risk stratification, comorbidities and participant's personal goals.;Provide advice, education, support and counseling about physical activity/exercise needs.       Expected Outcomes Short Term: Increase workloads from initial exercise prescription for resistance, speed, and METs.;Short Term: Perform resistance training exercises routinely during rehab and add in resistance training at home;Long Term: Improve cardiorespiratory fitness, muscular endurance and strength as measured by increased METs and functional capacity (6MWT)       Able to understand and use rate of perceived  exertion (RPE) scale Yes       Intervention Provide education and explanation on how to use RPE scale       Expected Outcomes Short Term: Able to use RPE daily in rehab to express subjective intensity level;Long Term:  Able to use RPE to guide intensity level when exercising independently       Able to understand and use Dyspnea scale Yes       Intervention Provide education and explanation on how to use Dyspnea scale       Expected Outcomes Short Term: Able to use Dyspnea scale daily in rehab to express subjective sense of shortness of breath during exertion;Long Term: Able to use Dyspnea scale to guide intensity level when exercising independently       Knowledge and understanding of Target Heart Rate Range (THRR) Yes       Intervention Provide education and explanation of THRR including how the numbers were predicted and where they are located for reference       Expected Outcomes Short Term: Able to state/look up THRR;Long Term: Able to use THRR to govern intensity when exercising independently;Short Term: Able to use daily as guideline for intensity in rehab       Understanding of Exercise Prescription Yes       Intervention Provide education, explanation, and written materials on patient's individual exercise prescription       Expected Outcomes Short Term: Able to explain program exercise prescription;Long Term: Able to explain home exercise prescription to exercise independently                Exercise Goals Re-Evaluation :  Exercise Goals Re-Evaluation     Row Name 07/02/22 1427             Exercise Goal Re-Evaluation   Exercise Goals Review Increase Physical Activity;Increase Strength and Stamina;Able to understand and use Dyspnea scale;Able to understand and use rate of perceived exertion (RPE) scale;Knowledge and understanding of Target Heart Rate Range (THRR);Understanding of Exercise Prescription       Comments Pt has completed 8 sessions of PR. He comes to class and is  motivated. He has started to increase his workload on both sets of equipment. He is currently exercising at 1.8 METs on the stepper. Will continue to montior and progress as able.  Expected Outcomes Through exercise at home and at rehab, the patient will meet their stated goals.                Discharge Exercise Prescription (Final Exercise Prescription Changes):  Exercise Prescription Changes - 07/02/22 1400       Response to Exercise   Blood Pressure (Admit) 120/62    Blood Pressure (Exercise) 128/72    Blood Pressure (Exit) 104/70    Heart Rate (Admit) 96 bpm    Heart Rate (Exercise) 118 bpm    Heart Rate (Exit) 98 bpm    Oxygen Saturation (Admit) 96 %    Oxygen Saturation (Exercise) 94 %    Oxygen Saturation (Exit) 96 %    Rating of Perceived Exertion (Exercise) 12    Perceived Dyspnea (Exercise) 12    Duration Continue with 30 min of aerobic exercise without signs/symptoms of physical distress.    Intensity THRR unchanged      Progression   Progression Continue to progress workloads to maintain intensity without signs/symptoms of physical distress.      Resistance Training   Training Prescription Yes    Weight 3    Reps 10-15    Time 10 Minutes      NuStep   Level 2    SPM 85    Minutes 22    METs 1.8      Arm Ergometer   Level 2    RPM 43    Minutes 17    METs 1.4             Nutrition:  Target Goals: Understanding of nutrition guidelines, daily intake of sodium '1500mg'$ , cholesterol '200mg'$ , calories 30% from fat and 7% or less from saturated fats, daily to have 5 or more servings of fruits and vegetables.  Biometrics:  Pre Biometrics - 06/03/22 1308       Pre Biometrics   Height '5\' 9"'$  (1.753 m)    Weight 122 kg    Waist Circumference 55 inches    Hip Circumference 56 inches    Waist to Hip Ratio 0.98 %    BMI (Calculated) 39.7    Triceps Skinfold 25 mm    % Body Fat 41.6 %    Grip Strength 23.3 kg    Flexibility 0 in    Single Leg  Stand 0 seconds              Nutrition Therapy Plan and Nutrition Goals:  Nutrition Therapy & Goals - 06/24/22 1444       Personal Nutrition Goals   Comments Patient scored  21 on his diet assessment.  We offer 2 educational sessions on heart healthy nutrition with hanhouts and offer assistance with RD  referral if patient is interested.      Intervention Plan   Intervention Nutrition handout(s) given to patient.    Expected Outcomes Short Term Goal: Understand basic principles of dietary content, such as calories, fat, sodium, cholesterol and nutrients.             Nutrition Assessments:  Nutrition Assessments - 06/03/22 1245       MEDFICTS Scores   Pre Score 21            MEDIFICTS Score Key: ?70 Need to make dietary changes  40-70 Heart Healthy Diet ? 40 Therapeutic Level Cholesterol Diet   Picture Your Plate Scores: <21 Unhealthy dietary pattern with much room for improvement. 41-50 Dietary pattern unlikely to meet recommendations for good health  and room for improvement. 51-60 More healthful dietary pattern, with some room for improvement.  >60 Healthy dietary pattern, although there may be some specific behaviors that could be improved.    Nutrition Goals Re-Evaluation:   Nutrition Goals Discharge (Final Nutrition Goals Re-Evaluation):   Psychosocial: Target Goals: Acknowledge presence or absence of significant depression and/or stress, maximize coping skills, provide positive support system. Participant is able to verbalize types and ability to use techniques and skills needed for reducing stress and depression.  Initial Review & Psychosocial Screening:  Initial Psych Review & Screening - 06/03/22 1305       Initial Review   Current issues with None Identified      Family Dynamics   Good Support System? Yes    Comments His daughter is his main support system.      Barriers   Psychosocial barriers to participate in program There are no  identifiable barriers or psychosocial needs.      Screening Interventions   Interventions Encouraged to exercise    Expected Outcomes Long Term goal: The participant improves quality of Life and PHQ9 Scores as seen by post scores and/or verbalization of changes;Short Term goal: Identification and review with participant of any Quality of Life or Depression concerns found by scoring the questionnaire.             Quality of Life Scores:  Quality of Life - 06/03/22 1309       Quality of Life   Select Quality of Life      Quality of Life Scores   Health/Function Pre 25.72 %    Socioeconomic Pre 30 %    Psych/Spiritual Pre 30 %    Family Pre 30 %    GLOBAL Pre 27.92 %            Scores of 19 and below usually indicate a poorer quality of life in these areas.  A difference of  2-3 points is a clinically meaningful difference.  A difference of 2-3 points in the total score of the Quality of Life Index has been associated with significant improvement in overall quality of life, self-image, physical symptoms, and general health in studies assessing change in quality of life.   PHQ-9: Review Flowsheet       06/03/2022  Depression screen PHQ 2/9  Decreased Interest 0  Down, Depressed, Hopeless 0  PHQ - 2 Score 0  Altered sleeping 0  Tired, decreased energy 2  Change in appetite 0  Feeling bad or failure about yourself  0  Trouble concentrating 0  Moving slowly or fidgety/restless 0  Suicidal thoughts 0  PHQ-9 Score 2  Difficult doing work/chores Not difficult at all   Interpretation of Total Score  Total Score Depression Severity:  1-4 = Minimal depression, 5-9 = Mild depression, 10-14 = Moderate depression, 15-19 = Moderately severe depression, 20-27 = Severe depression   Psychosocial Evaluation and Intervention:  Psychosocial Evaluation - 06/03/22 1324       Psychosocial Evaluation & Interventions   Interventions Encouraged to exercise with the program and follow  exercise prescription    Comments Pt has no barriers to participate in PR. He has no identifiable psychosocial issues. 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 His health conditions cause him to take frequent breaks while doing these activities  and he has to sit while doing them. He reports that he has a good support system with his daughter. Her reports that he divorced his wife three years ago and he is no longer stressed now that he is divorced. His goals while in the program are to lose weight and to decrease his SOB with his ADL's. He is eager to begin the program.    Expected Outcomes Pt will continue to have no identifiable psychosocial issues.    Continue Psychosocial Services  No Follow up required             Psychosocial Re-Evaluation:  Psychosocial Re-Evaluation     [place] Name 06/24/22 1426             Psychosocial Re-Evaluation   Current issues with None Identified       Comments Patient is new to the program completing 5 sessions.  His initial QOL score is 27.92% and his PHQ-9 score was 2.  He seems to enjoy coming to the program.  He also interacts  with class and staff.  Patient seems to enjoy coming to class and demonstrates a very positive outlook.  We will continue to monitor as he progresses in the program.   He will continue to have no psychosicial issues identified.       Expected Outcomes Patient will have no psychosocial barries indentified at discharge.       Interventions Relaxation education;Encouraged to attend Pulmonary Rehabilitation for the exercise;Stress management education       Continue Psychosocial Services  No Follow up required                Psychosocial Discharge (Final Psychosocial Re-Evaluation):  Psychosocial Re-Evaluation - 06/24/22 1426       Psychosocial  Re-Evaluation   Current issues with None Identified    Comments Patient is new to the program completing 5 sessions.  His initial QOL score is 27.92% and his PHQ-9 score was 2.  He seems to enjoy coming to the program.  He also interacts  with class and staff.  Patient seems to enjoy coming to class and demonstrates a very positive outlook.  We will continue to monitor as he progresses in the program.   He will continue to have no psychosicial issues identified.    Expected Outcomes Patient will have no psychosocial barries indentified at discharge.    Interventions Relaxation education;Encouraged to attend Pulmonary Rehabilitation for the exercise;Stress management education    Continue Psychosocial Services  No Follow up required              Education: Education Goals: Education classes will be provided on a weekly basis, covering required topics. Participant will state understanding/return demonstration of topics presented.  Learning Barriers/Preferences:  Learning Barriers/Preferences - 06/03/22 1308       Learning Barriers/Preferences   Learning Barriers None    Learning Preferences Audio;Verbal Instruction             Education Topics: How Lungs Work and Diseases: - Discuss the anatomy of the lungs and diseases that can affect the lungs, such as COPD.   Exercise: -Discuss the importance of exercise, FITT principles of exercise, normal and abnormal responses to exercise, and how to exercise safely.   Environmental Irritants: -Discuss types of environmental irritants and how to limit exposure to environmental irritants.   Meds/Inhalers and oxygen: - Discuss respiratory medications, definition of an inhaler and oxygen, and the proper way to use an inhaler and oxygen. Flowsheet  Row PULMONARY REHAB OTHER RESPIRATORY from 06/27/2022 in [place]  Date 06/06/22  Educator HJ       Energy Saving Techniques: - Discuss methods to conserve  energy and decrease shortness of breath when performing activities of daily living.  Flowsheet Row PULMONARY REHAB OTHER RESPIRATORY from 06/27/2022 in [place]  Date 06/13/22  Educator pb  Instruction Review Code 1- Verbalizes Understanding       Bronchial Hygiene / Breathing Techniques: - Discuss breathing mechanics, pursed-lip breathing technique,  proper posture, effective ways to clear airways, and other functional breathing techniques Flowsheet Row PULMONARY REHAB OTHER RESPIRATORY from 06/27/2022 in [place]  Date 06/20/22  Educator DF  Instruction Review Code 1- Verbalizes Understanding       Cleaning Equipment: - Provides group verbal and written instruction about the health risks of elevated stress, cause of high stress, and healthy ways to reduce stress. Flowsheet Row PULMONARY REHAB OTHER RESPIRATORY from 06/27/2022 in [place]  Date 06/27/22  Educator pb  Instruction Review Code 1- Verbalizes Understanding       Nutrition I: Fats: - Discuss the types of cholesterol, what cholesterol does to the body, and how cholesterol levels can be controlled.   Nutrition II: Labels: -Discuss the different components of food labels and how to read food labels.   Respiratory Infections: - Discuss the signs and symptoms of respiratory infections, ways to prevent respiratory infections, and the importance of seeking medical treatment when having a respiratory infection.   Stress I: Signs and Symptoms: - Discuss the causes of stress, how stress may lead to anxiety and depression, and ways to limit stress.   Stress II: Relaxation: -Discuss relaxation techniques to limit stress.   Oxygen for Home/Travel: - Discuss how to prepare for travel when on oxygen and proper ways to transport and store oxygen to ensure safety.   Knowledge Questionnaire Score:  Knowledge Questionnaire Score - 06/03/22 1245        Knowledge Questionnaire Score   Pre Score 10/18             Core Components/Risk Factors/Patient Goals at Admission:  Personal Goals and Risk Factors at Admission - 06/03/22 1311       Core Components/Risk Factors/Patient Goals on Admission    Weight Management Yes;Obesity;Weight Loss    Intervention Obesity: Provide education and appropriate resources to help participant work on and attain dietary goals.;Weight Management/Obesity: Establish reasonable short term and long term weight goals.;Weight Management: Provide education and appropriate resources to help participant work on and attain dietary goals.;Weight Management: Develop a combined nutrition and exercise program designed to reach desired caloric intake, while maintaining appropriate intake of nutrient and fiber, sodium and fats, and appropriate energy expenditure required for the weight goal.    Expected Outcomes Short Term: Continue to assess and modify interventions until short term weight is achieved;Long Term: Adherence to nutrition and physical activity/exercise program aimed toward attainment of established weight goal;Weight Maintenance: Understanding of the daily nutrition guidelines, which includes 25-35% calories from fat, 7% or less cal from saturated fats, less than '200mg'$  cholesterol, less than 1.5gm of sodium, & 5 or more servings of fruits and vegetables daily;Understanding recommendations for meals to include 15-35% energy as protein, 25-35% energy from fat, 35-60% energy from carbohydrates, less than '200mg'$  of dietary cholesterol, 20-35 gm of total fiber daily;Understanding of distribution of calorie intake throughout the day with the consumption of 4-5 meals/snacks;Weight Gain: Understanding of general recommendations  for a high calorie, high protein meal plan that promotes weight gain by distributing calorie intake throughout the day with the consumption for 4-5 meals, snacks, and/or supplements    Improve  shortness of breath with ADL's Yes    Intervention Provide education, individualized exercise plan and daily activity instruction to help decrease symptoms of SOB with activities of daily living.    Expected Outcomes Short Term: Improve cardiorespiratory fitness to achieve a reduction of symptoms when performing ADLs;Long Term: Be able to perform more ADLs without symptoms or delay the onset of symptoms    Diabetes Yes    Intervention Provide education about signs/symptoms and action to take for hypo/hyperglycemia.;Provide education about proper nutrition, including hydration, and aerobic/resistive exercise prescription along with prescribed medications to achieve blood glucose in normal ranges: Fasting glucose 65-99 mg/dL    Expected Outcomes Short Term: Participant verbalizes understanding of the signs/symptoms and immediate care of hyper/hypoglycemia, proper foot care and importance of medication, aerobic/resistive exercise and nutrition plan for blood glucose control.;Long Term: Attainment of HbA1C < 7%.    Heart Failure Yes    Intervention Provide a combined exercise and nutrition program that is supplemented with education, support and counseling about heart failure. Directed toward relieving symptoms such as shortness of breath, decreased exercise tolerance, and extremity edema.    Expected Outcomes Improve functional capacity of life;Short term: Attendance in program 2-3 days a week with increased exercise capacity. Reported lower sodium intake. Reported increased fruit and vegetable intake. Reports medication compliance.;Short term: Daily weights obtained and reported for increase. Utilizing diuretic protocols set by physician.;Long term: Adoption of self-care skills and reduction of barriers for early signs and symptoms recognition and intervention leading to self-care maintenance.             Core Components/Risk Factors/Patient Goals Review:   Goals and Risk Factor Review     Row Name  06/24/22 1433             Core Components/Risk Factors/Patient Goals Review   Personal Goals Review Weight Management/Obesity;Improve shortness of breath with ADL's;Develop more efficient breathing techniques such as purse lipped breathing and diaphragmatic breathing and practicing self-pacing with activity.       Review Patient is new to the program completing 5 sessions.  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  His personal goals for the program are to lose weight and decrease his SOB nwith daily ADL's . Weight are averaging around 122.2kg.   We will continue to monitor his progress as he works toward meeting these goals.       Expected Outcomes [facility]ilent will complete the program meeting both program and personal goals at discharge.                Core Components/Risk Factors/Patient Goals at Discharge (Final Review):   Goals and Risk Factor Review - 06/24/22 1433       Core Components/Risk Factors/Patient Goals Review   Personal Goals Review Weight Management/Obesity;Improve shortness of breath with ADL's;Develop more efficient breathing techniques such as purse lipped breathing and diaphragmatic breathing and practicing self-pacing with activity.    Review Patient is new to the program completing 5 sessions.  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  His personal goals for the program are to lose weight and decrease his SOB nwith daily ADL's . Weight are averaging around 122.2kg.   We will continue to monitor his progress as he works toward meeting these goals.  Expected Outcomes [facility] will complete the program meeting both program and personal goals at discharge.             ITP Comments:   Comments: ITP REVIEW Pt is making expected progress toward pulmonary rehab goals after completing 9 sessions. Recommend continued exercise, life style modification, education, and utilization of breathing techniques to increase stamina  and strength and decrease shortness of breath with exertion.

## 2022-07-04 ENCOUNTER — Encounter (HOSPITAL_COMMUNITY)
Admission: RE | Admit: 2022-07-04 | Discharge: 2022-07-04 | Disposition: A | Discharge status: Home / Self Care | Payer: Medicare HMO | Source: Ambulatory Visit | Attending: Student | Admitting: Student

## 2022-07-04 DIAGNOSIS — J432 Centrilobular emphysema: Secondary | ICD-10-CM

## 2022-07-04 NOTE — Progress Notes (Signed)
Daily Session Note  Patient Details  Name: Vincent Cooper MRN: [id number] Date of Birth: [birth year] Referring Provider:   Flowsheet Row PULMONARY REHAB OTHER RESP ORIENTATION from 06/03/2022 in [place]  Referring Provider Dr. Verlee Monte       Encounter Date: 07/04/2022  Check In:  Session Check In - 07/04/22 1330       Check-In   Supervising physician immediately available to respond to emergencies [facility] MD immediately available    Physician(s) Dr. Johnsie Cancel    Location [facility]    Staff Present Geanie Cooley, RN;Heather Otho Ket, BS, Exercise Physiologist;Dalton Kris Mouton, MS, ACSM-CEP, Exercise Physiologist;Debra Wynetta Emery, RN, BSN    Virtual Visit No    Medication changes reported     No    Fall or balance concerns reported    Yes    Comments Has not fallen, but he uses a walking cane for support. He admits to losing his balance but not falling.    Tobacco Cessation No Change    Warm-up and Cool-down Performed as group-led instruction    Resistance Training Performed Yes    VAD Patient? No    PAD/SET Patient? No      Pain Assessment   Currently in Pain? No/denies    Pain Score 0-No pain    Multiple Pain Sites No             Capillary Blood Glucose: No results found for this or any previous visit (from the past 24 hour(s)).    Social History   Tobacco Use  Smoking Status Former   Packs/day: 2.00   Years: 55.00   Total pack years: 110.00   Types: Cigarettes   Quit date: 11/18/2001   Years since quitting: 20.6  Smokeless Tobacco Former    Goals Met:  Proper associated with RPD/PD & O2 Sat Independence with exercise equipment Improved SOB with ADL's Exercise tolerated well No report of concerns or symptoms today Strength training completed today  Goals Unmet:  Not Applicable  Comments: check out @ 2:30pm   Dr. Kathie Dike is Medical Director for [facility] Pulmonary [facility].

## 2022-07-09 ENCOUNTER — Encounter (HOSPITAL_COMMUNITY)
Admission: RE | Admit: 2022-07-09 | Discharge: 2022-07-09 | Disposition: A | Discharge status: Home / Self Care | Payer: Medicare HMO | Source: Ambulatory Visit | Attending: Student | Admitting: Student

## 2022-07-09 DIAGNOSIS — J432 Centrilobular emphysema: Secondary | ICD-10-CM | POA: Diagnosis not present

## 2022-07-09 NOTE — Progress Notes (Signed)
Daily Session Note  Patient Details  Name: Vincent Cooper MRN: [id number] Date of Birth: Oct 13, 1941 Referring Provider:   Flowsheet Row PULMONARY REHAB OTHER RESP ORIENTATION from 06/03/2022 in [place]  Referring Provider Dr. Verlee Monte       Encounter Date: 07/09/2022  Check In:  Session Check In - 07/09/22 1330       Check-In   Supervising physician immediately available to respond to emergencies [facility] MD immediately available    Physician(s) Dr Harrington Challenger    Location [facility]    Staff Present Geanie Cooley, RN;Heather Otho Ket, BS, Exercise Physiologist;Aakash Hollomon Hassell Done, RN, BSN    Virtual Visit No    Medication changes reported     No    Fall or balance concerns reported    Yes    Comments Has not fallen, but he uses a walking cane for support. He admits to losing his balance but not falling.    Tobacco Cessation No Change    Warm-up and Cool-down Performed as group-led instruction    Resistance Training Performed Yes    VAD Patient? No    PAD/SET Patient? No      Pain Assessment   Currently in Pain? No/denies    Pain Score 0-No pain    Multiple Pain Sites No             Capillary Blood Glucose: No results found for this or any previous visit (from the past 24 hour(s)).    Social History   Tobacco Use  Smoking Status Former   Packs/day: 2.00   Years: 55.00   Total pack years: 110.00   Types: Cigarettes   Quit date: 11/18/2001   Years since quitting: 20.6  Smokeless Tobacco Former    Goals Met:  Proper associated with RPD/PD & O2 Sat Independence with exercise equipment Using PLB without cueing & demonstrates good technique Exercise tolerated well Queuing for purse lip breathing No report of concerns or symptoms today Strength training completed today  Goals Unmet:  Not Applicable  Comments: Checkout at 1430.   Dr. Kathie Dike is Medical Director for [facility] Pulmonary Re[facility].

## 2022-07-11 ENCOUNTER — Encounter (HOSPITAL_COMMUNITY)
Admission: RE | Admit: 2022-07-11 | Discharge: 2022-07-11 | Disposition: A | Discharge status: Home / Self Care | Payer: Medicare HMO | Source: Ambulatory Visit | Attending: Student | Admitting: Student

## 2022-07-11 DIAGNOSIS — J432 Centrilobular emphysema: Secondary | ICD-10-CM | POA: Diagnosis not present

## 2022-07-11 NOTE — Progress Notes (Signed)
Daily Session Note  Patient Details  Name: Vincent Cooper MRN: [id number] Date of Birth: 1941-03-20 Referring Provider:   Flowsheet Row PULMONARY REHAB OTHER RESP ORIENTATION from 06/03/2022 in [place]  Referring Provider Dr. Verlee Monte       Encounter Date: 07/11/2022  Check In:  Session Check In - 07/11/22 1329       Check-In   Supervising physician immediately available to respond to emergencies [facility] MD immediately available    Physician(s) Dr. Harl Bowie    Location [facility]    Staff Present Aundra Dubin, RN, Joanette Gula, RN, BSN;Heather Otho Ket, BS, Exercise Physiologist    Virtual Visit No    Medication changes reported     No    Fall or balance concerns reported    Yes    Comments Has not fallen, but he uses a walking cane for support. He admits to losing his balance but not falling.    Tobacco Cessation No Change    Warm-up and Cool-down Performed as group-led instruction    VAD Patient? No    PAD/SET Patient? No      Pain Assessment   Currently in Pain? No/denies    Pain Score 0-No pain    Multiple Pain Sites No             Capillary Blood Glucose: No results found for this or any previous visit (from the past 24 hour(s)).    Social History   Tobacco Use  Smoking Status Former   Packs/day: 2.00   Years: 55.00   Total pack years: 110.00   Types: Cigarettes   Quit date: 11/18/2001   Years since quitting: 20.6  Smokeless Tobacco Former    Goals Met:  Proper associated with RPD/PD & O2 Sat Independence with exercise equipment Using PLB without cueing & demonstrates good technique Exercise tolerated well No report of concerns or symptoms today Strength training completed today  Goals Unmet:  Not Applicable  Comments: Check out 1430.   Dr. Kathie Dike is Medical Director for [facility] Pulmonary Re[facility].

## 2022-07-16 ENCOUNTER — Encounter (HOSPITAL_COMMUNITY)
Admission: RE | Admit: 2022-07-16 | Discharge: 2022-07-16 | Disposition: A | Discharge status: Home / Self Care | Payer: Medicare HMO | Source: Ambulatory Visit | Attending: Student | Admitting: Student

## 2022-07-16 VITALS — Wt 273.1 lb

## 2022-07-16 DIAGNOSIS — J432 Centrilobular emphysema: Secondary | ICD-10-CM

## 2022-07-16 NOTE — Progress Notes (Signed)
Daily Session Note  Patient Details  Name: Vincent Cooper MRN: [id number] Date of Birth: Apr 04, 1941 Referring Provider:   Flowsheet Row PULMONARY REHAB OTHER RESP ORIENTATION from 06/03/2022 in [place]  Referring Provider Dr. Verlee Monte       Encounter Date: 07/16/2022  Check In:  Session Check In - 07/16/22 1330       Check-In   Supervising physician immediately available to respond to emergencies [facility] MD immediately available    Physician(s) Dr Gardiner Rhyme    Location [facility]    Staff Present Geanie Cooley, RN;Heather Otho Ket, BS, Exercise Physiologist;Chandra Feger Hassell Done, RN, BSN    Virtual Visit No    Medication changes reported     No    Fall or balance concerns reported    Yes    Comments Has not fallen, but he uses a walking cane for support. He admits to losing his balance but not falling.    Tobacco Cessation No Change    Warm-up and Cool-down Performed as group-led instruction    Resistance Training Performed Yes    VAD Patient? No    PAD/SET Patient? No      Pain Assessment   Currently in Pain? No/denies    Pain Score 0-No pain    Multiple Pain Sites No             Capillary Blood Glucose: No results found for this or any previous visit (from the past 24 hour(s)).    Social History   Tobacco Use  Smoking Status Former   Packs/day: 2.00   Years: 55.00   Total pack years: 110.00   Types: Cigarettes   Quit date: 11/18/2001   Years since quitting: 20.6  Smokeless Tobacco Former    Goals Met:  Proper associated with RPD/PD & O2 Sat Independence with exercise equipment Using PLB without cueing & demonstrates good technique Exercise tolerated well Queuing for purse lip breathing No report of concerns or symptoms today Strength training completed today  Goals Unmet:  Not Applicable  Comments: Checkout at 1430.   Dr. Kathie Dike is Medical Director for [facility] Pulmonary Re[facility].

## 2022-07-18 ENCOUNTER — Encounter (HOSPITAL_COMMUNITY)
Admission: RE | Admit: 2022-07-18 | Discharge: 2022-07-18 | Disposition: A | Discharge status: Home / Self Care | Payer: Medicare HMO | Source: Ambulatory Visit | Attending: Student | Admitting: Student

## 2022-07-18 DIAGNOSIS — J432 Centrilobular emphysema: Secondary | ICD-10-CM | POA: Diagnosis not present

## 2022-07-18 NOTE — Progress Notes (Signed)
Daily Session Note  Patient Details  Name: Vincent Cooper MRN: [id number] Date of Birth: 08/14/1941 Referring Provider:   Flowsheet Row PULMONARY REHAB OTHER RESP ORIENTATION from 06/03/2022 in [place] [facility]  Referring Provider Dr. Meier       Encounter Date: 07/18/2022  Check In:  Session Check In - 07/18/22 1325       Check-In   Supervising physician immediately available to respond to emergencies [facility] MD immediately available    Physician(s) Dr McDowell    Location [facility]    Staff Present Phyllis Billingsley, RN;Daphyne Martin, RN, BSN;Dalton Fletcher, MS, ACSM-CEP, Exercise Physiologist    Virtual Visit No    Medication changes reported     No    Fall or balance concerns reported    Yes    Comments Has not fallen, but he uses a walking cane for support. He admits to losing his balance but not falling.    Tobacco Cessation No Change    Warm-up and Cool-down Performed as group-led instruction    Resistance Training Performed Yes    VAD Patient? No    PAD/SET Patient? No      Pain Assessment   Currently in Pain? No/denies    Pain Score 0-No pain    Multiple Pain Sites No             Capillary Blood Glucose: No results found for this or any previous visit (from the past 24 hour(s)).    Social History   Tobacco Use  Smoking Status Former   Packs/day: 2.00   Years: 55.00   Total pack years: 110.00   Types: Cigarettes   Quit date: 11/18/2001   Years since quitting: 20.6  Smokeless Tobacco Former    Goals Met:  Proper associated with RPD/PD & O2 Sat Independence with exercise equipment Using PLB without cueing & demonstrates good technique Exercise tolerated well Queuing for purse lip breathing No report of concerns or symptoms today Strength training completed today  Goals Unmet:  Not Applicable  Comments: Checkout at 1430.   Dr. Jehanzeb Memon is Medical Director for [facility]. 

## 2022-07-23 ENCOUNTER — Other Ambulatory Visit (HOSPITAL_COMMUNITY): Payer: Self-pay

## 2022-07-23 ENCOUNTER — Encounter (HOSPITAL_COMMUNITY)
Admission: RE | Admit: 2022-07-23 | Discharge: 2022-07-23 | Disposition: A | Discharge status: Home / Self Care | Payer: Medicare HMO | Source: Ambulatory Visit | Attending: Student | Admitting: Student

## 2022-07-23 DIAGNOSIS — J432 Centrilobular emphysema: Secondary | ICD-10-CM | POA: Diagnosis not present

## 2022-07-23 NOTE — Progress Notes (Signed)
Daily Session Note  Patient Details  Name: Vincent Cooper MRN: [id number] Date of Birth: 1941-11-01 Referring Provider:   Flowsheet Row PULMONARY REHAB OTHER RESP ORIENTATION from 06/03/2022 in [place]  Referring Provider Dr. Verlee Monte       Encounter Date: 07/23/2022  Check In:  Session Check In - 07/23/22 1325       Check-In   Supervising physician immediately available to respond to emergencies [facility] MD immediately available    Physician(s) Dr Benson Norway    Location [facility]    Staff Present Geanie Cooley, RN;Raedyn Wenke Hassell Done, RN, BSN;Heather Otho Ket, BS, Exercise Physiologist    Virtual Visit No    Medication changes reported     No    Fall or balance concerns reported    Yes    Comments Has not fallen, but he uses a walking cane for support. He admits to losing his balance but not falling.    Tobacco Cessation No Change    Warm-up and Cool-down Performed as group-led instruction    Resistance Training Performed Yes    VAD Patient? No    PAD/SET Patient? No      Pain Assessment   Currently in Pain? No/denies    Pain Score 0-No pain    Multiple Pain Sites No             Capillary Blood Glucose: No results found for this or any previous visit (from the past 24 hour(s)).    Social History   Tobacco Use  Smoking Status Former   Packs/day: 2.00   Years: 55.00   Total pack years: 110.00   Types: Cigarettes   Quit date: 11/18/2001   Years since quitting: 20.6  Smokeless Tobacco Former    Goals Met:  Proper associated with RPD/PD & O2 Sat Independence with exercise equipment Using PLB without cueing & demonstrates good technique Exercise tolerated well Queuing for purse lip breathing No report of concerns or symptoms today Strength training completed today  Goals Unmet:  Not Applicable  Comments: Checkout at 1430.   Dr. Kathie Dike is Medical Director for [facility] Pulmonary Re[facility].

## 2022-07-25 ENCOUNTER — Encounter (HOSPITAL_COMMUNITY)
Admission: RE | Admit: 2022-07-25 | Discharge: 2022-07-25 | Disposition: A | Discharge status: Home / Self Care | Payer: Medicare HMO | Source: Ambulatory Visit | Attending: Student | Admitting: Student

## 2022-07-25 DIAGNOSIS — J432 Centrilobular emphysema: Secondary | ICD-10-CM

## 2022-07-25 NOTE — Progress Notes (Signed)
Daily Session Note  Patient Details  Name: Vincent Cooper MRN: [id number] Date of Birth: 26-Apr-1941 Referring Provider:   Flowsheet Row PULMONARY REHAB OTHER RESP ORIENTATION from 06/03/2022 in [place]  Referring Provider Dr. Verlee Monte       Encounter Date: 07/25/2022  Check In:  Session Check In - 07/25/22 1328       Check-In   Supervising physician immediately available to respond to emergencies [facility] MD immediately available    Physician(s) Dr Harl Bowie    Location [facility]    Staff Present Geanie Cooley, RN;Josephmichael Lisenbee Hassell Done, RN, BSN;Heather Otho Ket, BS, Exercise Physiologist    Virtual Visit No    Medication changes reported     No    Fall or balance concerns reported    Yes    Comments Has not fallen, but he uses a walking cane for support. He admits to losing his balance but not falling.    Tobacco Cessation No Change    Warm-up and Cool-down Performed as group-led instruction    Resistance Training Performed Yes    VAD Patient? No    PAD/SET Patient? No      Pain Assessment   Currently in Pain? No/denies    Pain Score 0-No pain    Multiple Pain Sites No             Capillary Blood Glucose: No results found for this or any previous visit (from the past 24 hour(s)).    Social History   Tobacco Use  Smoking Status Former   Packs/day: 2.00   Years: 55.00   Total pack years: 110.00   Types: Cigarettes   Quit date: 11/18/2001   Years since quitting: 20.6  Smokeless Tobacco Former    Goals Met:  Independence with exercise equipment Exercise tolerated well No report of concerns or symptoms today Strength training completed today  Goals Unmet:  Not Applicable  Comments: Checkout at 1430.   Dr. Kathie Dike is Medical Director for [facility] Pul[facility] Re[facility].

## 2022-07-29 ENCOUNTER — Other Ambulatory Visit (HOSPITAL_COMMUNITY): Payer: Self-pay

## 2022-07-30 ENCOUNTER — Encounter (HOSPITAL_COMMUNITY)
Admission: RE | Admit: 2022-07-30 | Discharge: 2022-07-30 | Disposition: A | Discharge status: Home / Self Care | Payer: Medicare HMO | Source: Ambulatory Visit | Attending: Student | Admitting: Student

## 2022-07-30 VITALS — Wt 275.6 lb

## 2022-07-30 DIAGNOSIS — J432 Centrilobular emphysema: Secondary | ICD-10-CM | POA: Diagnosis not present

## 2022-07-30 NOTE — Progress Notes (Signed)
Daily Session Note  Patient Details  Name: Vincent Cooper MRN: [id number] Date of Birth: [birth year] Referring Provider:   Flowsheet Row PULMONARY REHAB OTHER RESP ORIENTATION from 06/03/2022 in [place]  Referring Provider Dr. Verlee Monte       Encounter Date: 07/30/2022  Check In:  Session Check In - 07/30/22 1330       Check-In   Supervising physician immediately available to respond to emergencies [facility] MD immediately available    Physician(s) Dr Domenic Polite    Location [facility]    Staff Present Nethan Caudillo Hassell Done, RN, BSN;Heather Otho Ket, BS, Exercise Physiologist    Virtual Visit No    Medication changes reported     No    Fall or balance concerns reported    Yes    Comments Has not fallen, but he uses a walking cane for support. He admits to losing his balance but not falling.    Tobacco Cessation No Change    Warm-up and Cool-down Performed as group-led instruction    Resistance Training Performed Yes    VAD Patient? No    PAD/SET Patient? No      Pain Assessment   Currently in Pain? No/denies    Pain Score 0-No pain    Multiple Pain Sites No             Capillary Blood Glucose: No results found for this or any previous visit (from the past 24 hour(s)).    Social History   Tobacco Use  Smoking Status Former   Packs/day: 2.00   Years: 55.00   Total pack years: 110.00   Types: Cigarettes   Quit date: 11/18/2001   Years since quitting: 20.7  Smokeless Tobacco Former    Goals Met:  Proper associated with RPD/PD & O2 Sat Independence with exercise equipment Using PLB without cueing & demonstrates good technique Exercise tolerated well Queuing for purse lip breathing No report of concerns or symptoms today Strength training completed today  Goals Unmet:  Not Applicable  Comments: Checkout at 1430.   Dr. Kathie Dike is Medical Director for [facility].

## 2022-07-31 NOTE — Progress Notes (Signed)
Pulmonary Individual Treatment Plan  Patient Details  Name: Vincent Cooper MRN: [id number] Date of Birth: [birth year] Referring Provider:   Flowsheet Row PULMONARY REHAB OTHER RESP ORIENTATION from 06/03/2022 in [place]  Referring Provider Dr. 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[place])  Patient's Home Medications on Admission:   Current Outpatient Medications:    albuterol (VENTOLIN HFA) 108 (90 Base) MCG/ACT inhaler, Inhale 1 puff into the lungs every 6 (six) hours as needed for wheezing or shortness of breath., Disp: , Rfl:    apixaban (ELIQUIS) 2.5 MG TABS tablet, Take 2.5 mg by mouth 2 (two) times daily., Disp: , Rfl:    betamethasone dipropionate 0.05 % cream, Apply 1 Application topically daily as needed (irritation)., Disp: , Rfl:    Budeson-Glycopyrrol-Formoterol (BREZTRI AEROSPHERE) 160-9-4.8 MCG/ACT AERO, Inhale 2 puffs into the lungs in the morning and at bedtime., Disp: 10.7 g, Rfl: 11   diltiazem (CARDIZEM CD) 180 MG 24 hr capsule, Take 360 mg by mouth every morning., Disp: , Rfl:    docusate sodium (COLACE) 100 MG capsule, Take 100 mg by mouth daily., Disp: , Rfl:    ipratropium-albuterol (DUONEB) 0.5-2.5 (3) MG/3ML SOLN, Take 3 mLs by nebulization every 6 (six) hours as needed (shortness of breath). , Disp: , Rfl:    Mepolizumab (NUCALA) 100 MG/ML SOAJ, Inject 1 mL (100 mg total) into the skin every 28 (twenty-eight) days., Disp: 3 mL, Rfl: 1   metoprolol tartrate (LOPRESSOR) 25 MG tablet, Take 1 tablet (25 mg total) by mouth 2 (two) times daily., Disp: 60 tablet, Rfl: 3   nitroGLYCERIN (NITROSTAT) 0.4 MG SL tablet, Place 0.4 mg under the tongue every 5 (five) minutes as needed for chest pain., Disp: , Rfl:    potassium chloride SA (KLOR-CON) 20 MEQ tablet, Take 20 mEq by mouth  daily., Disp: , Rfl:    predniSONE (DELTASONE) 1 MG tablet, Take 5 tablets daily for 1 week, 4 tablets daily for 1 week, 3 tablets daily for 1 week, 2 tablets daily for 1 week, 1 tablet daily for one week then stop, Disp: 105 tablet, Rfl: 0   rosuvastatin (CRESTOR) 40 MG tablet, TAKE 1 TABLET BY MOUTH ONCE DAILY. (Patient taking differently: Take 40 mg by mouth at bedtime.), Disp: 90 tablet, Rfl: 1   Spacer/Aero-Holding Chambers (AEROCHAMBER MV) inhaler, Use as instructed, Disp: 1 each, Rfl: 0   tamsulosin (FLOMAX) 0.4 MG CAPS capsule, Take 1 capsule (0.4 mg total) by mouth daily. (Patient taking differently: Take 0.4 mg by mouth at bedtime.), Disp: 30 capsule, Rfl: 1   torsemide (DEMADEX) 20 MG tablet, Take 1 tablet (20 mg total) by mouth 2 (two) times daily. May take an extra 20 mg if needed for increase swelling (Patient taking differently: Take 20-40 mg by mouth See admin instructions. 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 Large Dom RCA-<PDA-PAV/PL - diffuse mild-mod plaque: prox (25-49%), mid (50-69%) CTFFR (p 0.99, m 0.85, d 0.81 -  NS).  Med-Size LAD - prox-mid long diffuse Mod-Severe Ca++ plaque (~50-69%, ?>70%): CTFFR p 0.95, m 0.88, d 0.86 -NS.  CTFFR LCx 0.94, OM1 0.90 - NS. (NS=Not Significant).   Diabetes mellitus type II, non insulin dependent (HCC)    Hypertension    Morbid obesity (HCC)    BMI of 38.5 with multiple risk factors.   OSA on CPAP    Pulmonary emboli ([place]) 10/2019   Chest CTA-4 [place] opacification of main PA but there is partially occlusive main posterior RLL and segmental/segmental branches.  Small thrombus noted in the anterior right middle lobe.  No RV  strain.  Scattered aortic atherosclerosis involving great vessels.  Coronary calcification noted.   RLS (restless legs syndrome)     Tobacco Use: Social History   Tobacco Use  Smoking Status Former   Packs/day: 2.00   Years: 55.00   Total pack years: 110.00   Types: Cigarettes   Quit date: 11/18/2001   Years since quitting: 20.7  Smokeless Tobacco Former    Labs: Review Flowsheet  More data exists      Latest Ref Rng & Units 03/20/2020 06/17/2020 06/22/2020 06/23/2020 10/16/2020  Labs for ITP Cardiac and Pulmonary Rehab  Cholestrol 100 - 199 mg/dL 264  - - - -  LDL (calc) 0 - 99 mg/dL 135  - - - -  HDL-C >39 mg/dL 99  - - - -  Trlycerides <150 mg/dL 176  - 151  - -  Hemoglobin A1c 4.8 - 5.6 % - 5.4  - - 4.7   PH, Arterial 7.350 - 7.450 - - 7.421  7.399  -  PCO2 arterial 32.0 - 48.0 mmHg - - 36.4  39.3  -  Bicarbonate 20.0 - 28.0 mmol/L - - - 24.1  -  Acid-base deficit 0.0 - 2.0 mmol/L - - 0.6  0.4  -  O2 Saturation % - - >100.0  97.4  -    Capillary Blood Glucose: Lab Results  Component Value Date   GLUCAP 143 (H) 10/23/2020   GLUCAP 115 (H) 10/23/2020   GLUCAP 113 (H) 10/23/2020   GLUCAP 120 (H) 10/22/2020   GLUCAP 142 (H) 10/22/2020     Pulmonary Assessment Scores:  Pulmonary Assessment Scores     Row Name 06/03/22 1243         ADL UCSD   ADL Phase Entry     SOB Score total 50     Rest 0     Walk 2     Stairs 4     Bath 3     Dress 3     Shop 2       CAT Score   CAT Score 8       mMRC Score   mMRC Score 4             UCSD: Self-administered rating of dyspnea associated with activities of daily living (ADLs) 6-point scale (0 = "not at all" to 5 = "maximal or unable to do because of breathlessness")  Scoring Scores range from 0 to 120.  Minimally important difference is 5 units  CAT: CAT can identify the health impairment of COPD patients and is better correlated with disease progression.  CAT has a scoring range of zero to 40. The CAT score is  classified into four groups of low (less than 10), medium (10 - 20), high (21-30) and very high (31-40) based on the impact level of disease on health status. A CAT score over  10 suggests significant symptoms.  A worsening CAT score could be explained by an exacerbation, poor medication adherence, poor inhaler technique, or progression of COPD or comorbid conditions.  CAT MCID is 2 points  mMRC: mMRC (Modified Medical Research Council) Dyspnea Scale is used to assess the degree of baseline functional disability in patients of respiratory disease due to dyspnea. No minimal important difference is established. A decrease in score of 1 point or greater is considered a positive change.   Pulmonary Function Assessment:   Exercise Target Goals: Exercise Program Goal: Individual exercise prescription set using results from initial 6 min walk test and THRR while considering  patient's activity barriers and safety.   Exercise Prescription Goal: Initial exercise prescription builds to 30-45 minutes a day of aerobic activity, 2-3 days per week.  Home exercise guidelines will be given to patient during program as part of exercise prescription that the participant will acknowledge.  Activity Barriers & Risk Stratification:  Activity Barriers & Cardiac Risk Stratification - 06/03/22 1302       Activity Barriers & Cardiac Risk Stratification   Activity Barriers Deconditioning;Shortness of Breath;Assistive Device    Cardiac Risk Stratification High             6 Minute Walk:  6 Minute Walk     Row Name 06/03/22 1304         6 Minute Walk   Phase Initial     Distance 600 feet     Walk Time 6 minutes     # of Rest Breaks 1     MPH 1.13     METS 0.56     RPE 12     Perceived Dyspnea  11     VO2 Peak 1.99     Symptoms Yes (comment)     Comments One sitting break for 1 minute due to pt feeling weak and tired     Resting HR 85 bpm     Resting BP 112/76     Resting Oxygen Saturation  95  %     Exercise Oxygen Saturation  during 6 min walk 93 %     Max Ex. HR 126 bpm     Max Ex. BP 120/70     2 Minute Post BP 110/68       Interval HR   1 Minute HR 124     2 Minute HR 124     3 Minute HR 120     4 Minute HR 128     5 Minute HR 120     6 Minute HR 126     2 Minute Post HR 97     Interval Heart Rate? Yes       Interval Oxygen   Interval Oxygen? Yes     Baseline Oxygen Saturation % 95 %     1 Minute Oxygen Saturation % 92 %     1 Minute Liters of Oxygen 0 L     2 Minute Oxygen Saturation % 93 %     2 Minute Liters of Oxygen 0 L     3 Minute Oxygen Saturation % 94 %     3 Minute Liters of Oxygen 0 L     4 Minute Oxygen Saturation % 95 %     4 Minute Liters of Oxygen 0 L     5 Minute Oxygen Saturation % 94 %     5 Minute Liters of Oxygen 0 L     6  Minute Oxygen Saturation % 93 %     6 Minute Liters of Oxygen 0 L     2 Minute Post Oxygen Saturation % 96 %     2 Minute Post Liters of Oxygen 0 L              Oxygen Initial Assessment:  Oxygen Initial Assessment - 06/03/22 1305       Home Oxygen   Home Oxygen Device None    Sleep Oxygen Prescription CPAP    Home Exercise Oxygen Prescription None    Home Resting Oxygen Prescription None    Compliance with Home Oxygen Use Yes      Initial 6 min Walk   Oxygen Used None      Program Oxygen Prescription   Program Oxygen Prescription None      Intervention   Short Term Goals To learn and understand importance of monitoring SPO2 with pulse oximeter and demonstrate accurate use of the pulse oximeter.;To learn and understand importance of maintaining oxygen saturations>88%;To learn and demonstrate proper pursed lip breathing techniques or other breathing techniques.     Long  Term Goals Verbalizes importance of monitoring SPO2 with pulse oximeter and return demonstration;Maintenance of O2 saturations>88%;Exhibits proper breathing techniques, such as pursed lip breathing or other method taught during program  session;Compliance with respiratory medication             Oxygen Re-Evaluation:  Oxygen Re-Evaluation     Row Name 07/02/22 1429 07/30/22 1434           Program Oxygen Prescription   Program Oxygen Prescription None None        Home Oxygen   Home Oxygen Device None None      Sleep Oxygen Prescription CPAP CPAP      Home Exercise Oxygen Prescription None None      Home Resting Oxygen Prescription -- None      Compliance with Home Oxygen Use Yes Yes        Goals/Expected Outcomes   Short Term Goals To learn and understand importance of monitoring SPO2 with pulse oximeter and demonstrate accurate use of the pulse oximeter.;To learn and understand importance of maintaining oxygen saturations>88%;To learn and demonstrate proper pursed lip breathing techniques or other breathing techniques.  To learn and understand importance of monitoring SPO2 with pulse oximeter and demonstrate accurate use of the pulse oximeter.;To learn and understand importance of maintaining oxygen saturations>88%;To learn and demonstrate proper pursed lip breathing techniques or other breathing techniques.       Long  Term Goals Verbalizes importance of monitoring SPO2 with pulse oximeter and return demonstration;Maintenance of O2 saturations>88%;Exhibits proper breathing techniques, such as pursed lip breathing or other method taught during program session;Compliance with respiratory medication Verbalizes importance of monitoring SPO2 with pulse oximeter and return demonstration;Maintenance of O2 saturations>88%;Exhibits proper breathing techniques, such as pursed lip breathing or other method taught during program session;Compliance with respiratory medication      Goals/Expected Outcomes compliance compliance               Oxygen Discharge (Final Oxygen Re-Evaluation):  Oxygen Re-Evaluation - 07/30/22 1434       Program Oxygen Prescription   Program Oxygen Prescription None      Home Oxygen   Home  Oxygen Device None    Sleep Oxygen Prescription CPAP    Home Exercise Oxygen Prescription None    Home Resting Oxygen Prescription None    Compliance with Home Oxygen Use Yes  Goals/Expected Outcomes   Short Term Goals To learn and understand importance of monitoring SPO2 with pulse oximeter and demonstrate accurate use of the pulse oximeter.;To learn and understand importance of maintaining oxygen saturations>88%;To learn and demonstrate proper pursed lip breathing techniques or other breathing techniques.     Long  Term Goals Verbalizes importance of monitoring SPO2 with pulse oximeter and return demonstration;Maintenance of O2 saturations>88%;Exhibits proper breathing techniques, such as pursed lip breathing or other method taught during program session;Compliance with respiratory medication    Goals/Expected Outcomes compliance             Initial Exercise Prescription:  Initial Exercise Prescription - 06/03/22 1300       Date of Initial Exercise RX and Referring Provider   Date 06/03/22    Referring Provider Dr. Verlee Monte    Expected Discharge Date 10/08/22      NuStep   Level 1    SPM 60    Minutes 17      Arm Ergometer   Level 1    RPM 30    Minutes 22      Prescription Details   Frequency (times per week) 2    Duration Progress to 30 minutes of continuous aerobic without signs/symptoms of physical distress      Intensity   THRR 40-80% of Max Heartrate 56-112    Ratings of Perceived Exertion 11-13    Perceived Dyspnea 0-4      Resistance Training   Training Prescription Yes    Weight 3    Reps 10-15             Perform Capillary Blood Glucose checks as needed.  Exercise Prescription Changes:   Exercise Prescription Changes     Row Name 06/18/22 1500 07/02/22 1400 07/16/22 1400 07/30/22 1400       Response to Exercise   Blood Pressure (Admit) 132/62 120/62 108/70 124/74    Blood Pressure (Exercise) 140/70 128/72 130/74 122/72    Blood Pressure  (Exit) 120/70 104/70 112/62 122/60    Heart Rate (Admit) 98 bpm 96 bpm 96 bpm 92 bpm    Heart Rate (Exercise) 116 bpm 118 bpm 114 bpm 107 bpm    Heart Rate (Exit) 92 bpm 98 bpm 85 bpm 90 bpm    Oxygen Saturation (Admit) 94 % 96 % 94 % 95 %    Oxygen Saturation (Exercise) 94 % 94 % 93 % 93 %    Oxygen Saturation (Exit) 94 % 96 % 94 % 96 %    Rating of Perceived Exertion (Exercise) '12 12 12 12    '$ Perceived Dyspnea (Exercise) '11 12 12 12    '$ Duration Continue with 30 min of aerobic exercise without signs/symptoms of physical distress. Continue with 30 min of aerobic exercise without signs/symptoms of physical distress. Continue with 30 min of aerobic exercise without signs/symptoms of physical distress. Continue with 30 min of aerobic exercise without signs/symptoms of physical distress.    Intensity THRR unchanged THRR unchanged THRR unchanged THRR unchanged      Progression   Progression Continue to progress workloads to maintain intensity without signs/symptoms of physical distress. Continue to progress workloads to maintain intensity without signs/symptoms of physical distress. Continue to progress workloads to maintain intensity without signs/symptoms of physical distress. Continue to progress workloads to maintain intensity without signs/symptoms of physical distress.      Resistance Training   Training Prescription Yes Yes Yes Yes    Weight '3 3 4 '$ 4  Reps 10-15 10-15 10-15 10-15    Time 10 Minutes 10 Minutes 10 Minutes 10 Minutes      NuStep   Level '1 2 2 2    '$ SPM 74 85 92 85    Minutes '22 22 22 22    '$ METs 1.8 1.8 2 1.9      Arm Ergometer   Level '1 2 2 2    '$ RPM 38 43 43 40    Minutes '17 17 17 17    '$ METs 1.4 1.4 1.6 1.5             Exercise Comments:   Exercise Goals and Review:   Exercise Goals     Row Name 07/02/22 1427 07/30/22 1431           Exercise Goals   Increase Physical Activity Yes Yes      Intervention Provide advice, education, support and  counseling about physical activity/exercise needs.;Develop an individualized exercise prescription for aerobic and resistive training based on initial evaluation findings, risk stratification, comorbidities and participant's personal goals. Provide advice, education, support and counseling about physical activity/exercise needs.;Develop an individualized exercise prescription for aerobic and resistive training based on initial evaluation findings, risk stratification, comorbidities and participant's personal goals.      Expected Outcomes Short Term: Attend rehab on a regular basis to increase amount of physical activity.;Long Term: Add in home exercise to make exercise part of routine and to increase amount of physical activity.;Long Term: Exercising regularly at least 3-5 days a week. Short Term: Attend rehab on a regular basis to increase amount of physical activity.;Long Term: Add in home exercise to make exercise part of routine and to increase amount of physical activity.;Long Term: Exercising regularly at least 3-5 days a week.      Increase Strength and Stamina Yes Yes      Intervention Develop an individualized exercise prescription for aerobic and resistive training based on initial evaluation findings, risk stratification, comorbidities and participant's personal goals.;Provide advice, education, support and counseling about physical activity/exercise needs. Develop an individualized exercise prescription for aerobic and resistive training based on initial evaluation findings, risk stratification, comorbidities and participant's personal goals.;Provide advice, education, support and counseling about physical activity/exercise needs.      Expected Outcomes Short Term: Increase workloads from initial exercise prescription for resistance, speed, and METs.;Short Term: Perform resistance training exercises routinely during rehab and add in resistance training at home;Long Term: Improve cardiorespiratory  fitness, muscular endurance and strength as measured by increased METs and functional capacity (6MWT) Short Term: Increase workloads from initial exercise prescription for resistance, speed, and METs.;Short Term: Perform resistance training exercises routinely during rehab and add in resistance training at home;Long Term: Improve cardiorespiratory fitness, muscular endurance and strength as measured by increased METs and functional capacity (6MWT)      Able to understand and use rate of perceived exertion (RPE) scale Yes Yes      Intervention Provide education and explanation on how to use RPE scale Provide education and explanation on how to use RPE scale      Expected Outcomes Short Term: Able to use RPE daily in rehab to express subjective intensity level;Long Term:  Able to use RPE to guide intensity level when exercising independently Short Term: Able to use RPE daily in rehab to express subjective intensity level;Long Term:  Able to use RPE to guide intensity level when exercising independently      Able to understand and use Dyspnea scale Yes Yes  Intervention Provide education and explanation on how to use Dyspnea scale Provide education and explanation on how to use Dyspnea scale      Expected Outcomes Short Term: Able to use Dyspnea scale daily in rehab to express subjective sense of shortness of breath during exertion;Long Term: Able to use Dyspnea scale to guide intensity level when exercising independently Short Term: Able to use Dyspnea scale daily in rehab to express subjective sense of shortness of breath during exertion;Long Term: Able to use Dyspnea scale to guide intensity level when exercising independently      Knowledge and understanding of Target Heart Rate Range (THRR) Yes Yes      Intervention Provide education and explanation of THRR including how the numbers were predicted and where they are located for reference Provide education and explanation of THRR including how the  numbers were predicted and where they are located for reference      Expected Outcomes Short Term: Able to state/look up THRR;Long Term: Able to use THRR to govern intensity when exercising independently;Short Term: Able to use daily as guideline for intensity in rehab Short Term: Able to state/look up THRR;Long Term: Able to use THRR to govern intensity when exercising independently;Short Term: Able to use daily as guideline for intensity in rehab      Understanding of Exercise Prescription Yes Yes      Intervention Provide education, explanation, and written materials on patient's individual exercise prescription Provide education, explanation, and written materials on patient's individual exercise prescription      Expected Outcomes Short Term: Able to explain program exercise prescription;Long Term: Able to explain home exercise prescription to exercise independently Short Term: Able to explain program exercise prescription;Long Term: Able to explain home exercise prescription to exercise independently               Exercise Goals Re-Evaluation :  Exercise Goals Re-Evaluation     Row Name 07/02/22 1427 07/30/22 1432           Exercise Goal Re-Evaluation   Exercise Goals Review Increase Physical Activity;Increase Strength and Stamina;Able to understand and use Dyspnea scale;Able to understand and use rate of perceived exertion (RPE) scale;Knowledge and understanding of Target Heart Rate Range (THRR);Understanding of Exercise Prescription Increase Physical Activity;Increase Strength and Stamina;Able to understand and use rate of perceived exertion (RPE) scale;Able to understand and use Dyspnea scale;Knowledge and understanding of Target Heart Rate Range (THRR);Understanding of Exercise Prescription      Comments Pt has completed 8 sessions of PR. He comes to class and is motivated. He has started to increase his workload on both sets of equipment. He is currently exercising at 1.8 METs on the  stepper. Will continue to montior and progress as able. Pt has completed 16 sessions of PR. He is eagar to come to class and is motivated to start. He is currently exercising 1.9 METs on the stepper. Will cotinue to monitor and progress as able.      Expected Outcomes Through exercise at home and at rehab, the patient will meet their stated goals. Through exercise at home and at rehab, the patient will meet their stated goals.               Discharge Exercise Prescription (Final Exercise Prescription Changes):  Exercise Prescription Changes - 07/30/22 1400       Response to Exercise   Blood Pressure (Admit) 124/74    Blood Pressure (Exercise) 122/72    Blood Pressure (Exit) 122/60  Heart Rate (Admit) 92 bpm    Heart Rate (Exercise) 107 bpm    Heart Rate (Exit) 90 bpm    Oxygen Saturation (Admit) 95 %    Oxygen Saturation (Exercise) 93 %    Oxygen Saturation (Exit) 96 %    Rating of Perceived Exertion (Exercise) 12    Perceived Dyspnea (Exercise) 12    Duration Continue with 30 min of aerobic exercise without signs/symptoms of physical distress.    Intensity THRR unchanged      Progression   Progression Continue to progress workloads to maintain intensity without signs/symptoms of physical distress.      Resistance Training   Training Prescription Yes    Weight 4    Reps 10-15    Time 10 Minutes      NuStep   Level 2    SPM 85    Minutes 22    METs 1.9      Arm Ergometer   Level 2    RPM 40    Minutes 17    METs 1.5             Nutrition:  Target Goals: Understanding of nutrition guidelines, daily intake of sodium '1500mg'$ , cholesterol '200mg'$ , calories 30% from fat and 7% or less from saturated fats, daily to have 5 or more servings of fruits and vegetables.  Biometrics:  Pre Biometrics - 06/03/22 1308       Pre Biometrics   Height '5\' 9"'$  (1.753 m)    Weight 122 kg    Waist Circumference 55 inches    Hip Circumference 56 inches    Waist to Hip Ratio  0.98 %    BMI (Calculated) 39.7    Triceps Skinfold 25 mm    % Body Fat 41.6 %    Grip Strength 23.3 kg    Flexibility 0 in    Single Leg Stand 0 seconds              Nutrition Therapy Plan and Nutrition Goals:  Nutrition Therapy & Goals - 06/24/22 1444       Personal Nutrition Goals   Comments Patient scored  21 on his diet assessment.  We offer 2 educational sessions on heart healthy nutrition with hanhouts and offer assistance with RD  referral if patient is interested.      Intervention Plan   Intervention Nutrition handout(s) given to patient.    Expected Outcomes Short Term Goal: Understand basic principles of dietary content, such as calories, fat, sodium, cholesterol and nutrients.             Nutrition Assessments:  Nutrition Assessments - 06/03/22 1245       MEDFICTS Scores   Pre Score 21            MEDIFICTS Score Key: ?70 Need to make dietary changes  40-70 Heart Healthy Diet ? 40 Therapeutic Level Cholesterol Diet   Picture Your Plate Scores: <71 Unhealthy dietary pattern with much room for improvement. 41-50 Dietary pattern unlikely to meet recommendations for good health and room for improvement. 51-60 More healthful dietary pattern, with some room for improvement.  >60 Healthy dietary pattern, although there may be some specific behaviors that could be improved.    Nutrition Goals Re-Evaluation:   Nutrition Goals Discharge (Final Nutrition Goals Re-Evaluation):   Psychosocial: Target Goals: Acknowledge presence or absence of significant depression and/or stress, maximize coping skills, provide positive support system. Participant is able to verbalize types and ability to use techniques  and skills needed for reducing stress and depression.  Initial Review & Psychosocial Screening:  Initial Psych Review & Screening - 06/03/22 1305       Initial Review   Current issues with None Identified      Family Dynamics   Good Support  System? Yes    Comments His daughter is his main support system.      Barriers   Psychosocial barriers to participate in program There are no identifiable barriers or psychosocial needs.      Screening Interventions   Interventions Encouraged to exercise    Expected Outcomes Long Term goal: The participant improves quality of Life and PHQ9 Scores as seen by post scores and/or verbalization of changes;Short Term goal: Identification and review with participant of any Quality of Life or Depression concerns found by scoring the questionnaire.             Quality of Life Scores:  Quality of Life - 06/03/22 1309       Quality of Life   Select Quality of Life      Quality of Life Scores   Health/Function Pre 25.72 %    Socioeconomic Pre 30 %    Psych/Spiritual Pre 30 %    Family Pre 30 %    GLOBAL Pre 27.92 %            Scores of 19 and below usually indicate a poorer quality of life in these areas.  A difference of  2-3 points is a clinically meaningful difference.  A difference of 2-3 points in the total score of the Quality of Life Index has been associated with significant improvement in overall quality of life, self-image, physical symptoms, and general health in studies assessing change in quality of life.   PHQ-9: Review Flowsheet       06/03/2022  Depression screen PHQ 2/9  Decreased Interest 0  Down, Depressed, Hopeless 0  PHQ - 2 Score 0  Altered sleeping 0  Tired, decreased energy 2  Change in appetite 0  Feeling bad or failure about yourself  0  Trouble concentrating 0  Moving slowly or fidgety/restless 0  Suicidal thoughts 0  PHQ-9 Score 2  Difficult doing work/chores Not difficult at all   Interpretation of Total Score  Total Score Depression Severity:  1-4 = Minimal depression, 5-9 = Mild depression, 10-14 = Moderate depression, 15-19 = Moderately severe depression, 20-27 = Severe depression   Psychosocial Evaluation and Intervention:   Psychosocial Evaluation - 06/03/22 1324       Psychosocial Evaluation & Interventions   Interventions Encouraged to exercise with the program and follow exercise prescription    Comments Pt has no barriers to participate in PR. He has no identifiable psychosocial issues. 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 His health conditions cause him to take frequent breaks while doing these activities and he has to sit while doing them. He reports that he has a good support system with his daughter. Her reports that he divorced his wife three years ago and he is no longer stressed now that he is divorced. His goals while in the program are to lose weight and to decrease his SOB with his ADL's. He is eager to begin the program.    Expected Outcomes Pt will continue to have no  identifiable psychosocial issues.    Continue Psychosocial Services  No Follow up required             Psychosocial Re-Evaluation:  Psychosocial Re-Evaluation     [place] Name 06/24/22 1426 07/24/22 1423           Psychosocial Re-Evaluation   Current issues with None Identified None Identified      Comments Patient is new to the program completing 5 sessions.  His initial QOL score is 27.92% and his PHQ-9 score was 2.  He seems to enjoy coming to the program.  He also interacts  with class and staff.  Patient seems to enjoy coming to class and demonstrates a very positive outlook.  We will continue to monitor as he progresses in the program.   He will continue to have no psychosicial issues identified. Patient has completed 14 sessions.   He seems to enjoy coming to the program.  Patient enjoys socializing and interacts well with class and staff, also very talkative and friendly.   He also demonstrates a very positive outlook and attitude,  he continues to  work hard in [place].  We will continue to monitor as he progresses in the program.   He will continue to have no psychosicial issues identified.      Expected Outcomes Patient will have no psychosocial barries indentified at discharge. Patient will have no psychosocial barries indentified at discharge.      Interventions Relaxation education;Encouraged to attend Pulmonary Rehabilitation for the exercise;Stress management education Relaxation education;Encouraged to attend Pulmonary Rehabilitation for the exercise;Stress management education      Continue Psychosocial Services  No Follow up required No Follow up required               Psychosocial Discharge (Final Psychosocial Re-Evaluation):  Psychosocial Re-Evaluation - 07/24/22 1423       Psychosocial Re-Evaluation   Current issues with None Identified    Comments Patient has completed 14 sessions.   He seems to enjoy coming to the program.  Patient enjoys socializing and interacts well with class and staff, also very talkative and friendly.   He also demonstrates a very positive outlook and attitude,  he continues to work hard in [place].  We will continue to monitor as he progresses in the program.   He will continue to have no psychosicial issues identified.    Expected Outcomes Patient will have no psychosocial barries indentified at discharge.    Interventions Relaxation education;Encouraged to attend Pulmonary Rehabilitation for the exercise;Stress management education    Continue Psychosocial Services  No Follow up required              Education: Education Goals: Education classes will be provided on a weekly basis, covering required topics. Participant will state understanding/return demonstration of topics presented.  Learning Barriers/Preferences:  Learning Barriers/Preferences - 06/03/22 1308       Learning Barriers/Preferences   Learning Barriers None    Learning Preferences Audio;Verbal Instruction              Education Topics: How Lungs Work and Diseases: - Discuss the anatomy of the lungs and diseases that can affect the lungs, such as COPD.   Exercise: -Discuss the importance of exercise, FITT principles of exercise, normal and abnormal responses to exercise, and how to exercise safely.   Environmental Irritants: -Discuss types of environmental irritants and how to limit exposure to environmental irritants.   Meds/Inhalers and oxygen: - Discuss respiratory medications, definition of an  inhaler and oxygen, and the proper way to use an inhaler and oxygen. Flowsheet Row PULMONARY REHAB OTHER RESPIRATORY from 07/25/2022 in [place]  Date 06/06/22  Educator HJ       Energy Saving Techniques: - Discuss methods to conserve energy and decrease shortness of breath when performing activities of daily living.  Flowsheet Row PULMONARY REHAB OTHER RESPIRATORY from 07/25/2022 in [place]  Date 06/13/22  Educator pb  Instruction Review Code 1- Verbalizes Understanding       Bronchial Hygiene / Breathing Techniques: - Discuss breathing mechanics, pursed-lip breathing technique,  proper posture, effective ways to clear airways, and other functional breathing techniques Flowsheet Row PULMONARY REHAB OTHER RESPIRATORY from 07/25/2022 in [place]  Date 06/20/22  Educator DF  Instruction Review Code 1- Verbalizes Understanding       Cleaning Equipment: - Provides group verbal and written instruction about the health risks of elevated stress, cause of high stress, and healthy ways to reduce stress. Flowsheet Row PULMONARY REHAB OTHER RESPIRATORY from 07/25/2022 in [place]  Date 06/27/22  Educator pb  Instruction Review Code 1- Verbalizes Understanding       Nutrition I: Fats: - Discuss the types of cholesterol, what cholesterol does to the body, and how cholesterol levels can be  controlled. Flowsheet Row PULMONARY REHAB OTHER RESPIRATORY from 07/25/2022 in [place]  Date 07/04/22  Educator pb  Instruction Review Code 1- Verbalizes Understanding       Nutrition II: Labels: -Discuss the different components of food labels and how to read food labels. Flowsheet Row PULMONARY REHAB OTHER RESPIRATORY from 07/25/2022 in [place]  Date 07/11/22  Educator Handout  Instruction Review Code 1- Verbalizes Understanding       Respiratory Infections: - Discuss the signs and symptoms of respiratory infections, ways to prevent respiratory infections, and the importance of seeking medical treatment when having a respiratory infection. Flowsheet Row PULMONARY REHAB OTHER RESPIRATORY from 07/25/2022 in [place]  Date 07/25/22  Educator DM  Instruction Review Code 1- Verbalizes Understanding       Stress I: Signs and Symptoms: - Discuss the causes of stress, how stress may lead to anxiety and depression, and ways to limit stress.   Stress II: Relaxation: -Discuss relaxation techniques to limit stress.   Oxygen for Home/Travel: - Discuss how to prepare for travel when on oxygen and proper ways to transport and store oxygen to ensure safety.   Knowledge Questionnaire Score:  Knowledge Questionnaire Score - 06/03/22 1245       Knowledge Questionnaire Score   Pre Score 10/18             Core Components/Risk Factors/Patient Goals at Admission:  Personal Goals and Risk Factors at Admission - 06/03/22 1311       Core Components/Risk Factors/Patient Goals on Admission    Weight Management Yes;Obesity;Weight Loss    Intervention Obesity: Provide education and appropriate resources to help participant work on and attain dietary goals.;Weight Management/Obesity: Establish reasonable short term and long term weight goals.;Weight Management: Provide education and appropriate resources to help  participant work on and attain dietary goals.;Weight Management: Develop a combined nutrition and exercise program designed to reach desired caloric intake, while maintaining appropriate intake of nutrient and fiber, sodium and fats, and appropriate energy expenditure required for the weight goal.    Expected Outcomes Short Term: Continue to assess and modify interventions until short term weight  is achieved;Long Term: Adherence to nutrition and physical activity/exercise program aimed toward attainment of established weight goal;Weight Maintenance: Understanding of the daily nutrition guidelines, which includes 25-35% calories from fat, 7% or less cal from saturated fats, less than '200mg'$  cholesterol, less than 1.5gm of sodium, & 5 or more servings of fruits and vegetables daily;Understanding recommendations for meals to include 15-35% energy as protein, 25-35% energy from fat, 35-60% energy from carbohydrates, less than '200mg'$  of dietary cholesterol, 20-35 gm of total fiber daily;Understanding of distribution of calorie intake throughout the day with the consumption of 4-5 meals/snacks;Weight Gain: Understanding of general recommendations for a high calorie, high protein meal plan that promotes weight gain by distributing calorie intake throughout the day with the consumption for 4-5 meals, snacks, and/or supplements    Improve shortness of breath with ADL's Yes    Intervention Provide education, individualized exercise plan and daily activity instruction to help decrease symptoms of SOB with activities of daily living.    Expected Outcomes Short Term: Improve cardiorespiratory fitness to achieve a reduction of symptoms when performing ADLs;Long Term: Be able to perform more ADLs without symptoms or delay the onset of symptoms    Diabetes Yes    Intervention Provide education about signs/symptoms and action to take for hypo/hyperglycemia.;Provide education about proper nutrition, including hydration, and  aerobic/resistive exercise prescription along with prescribed medications to achieve blood glucose in normal ranges: Fasting glucose 65-99 mg/dL    Expected Outcomes Short Term: Participant verbalizes understanding of the signs/symptoms and immediate care of hyper/hypoglycemia, proper foot care and importance of medication, aerobic/resistive exercise and nutrition plan for blood glucose control.;Long Term: Attainment of HbA1C < 7%.    Heart Failure Yes    Intervention Provide a combined exercise and nutrition program that is supplemented with education, support and counseling about heart failure. Directed toward relieving symptoms such as shortness of breath, decreased exercise tolerance, and extremity edema.    Expected Outcomes Improve functional capacity of life;Short term: Attendance in program 2-3 days a week with increased exercise capacity. Reported lower sodium intake. Reported increased fruit and vegetable intake. Reports medication compliance.;Short term: Daily weights obtained and reported for increase. Utilizing diuretic protocols set by physician.;Long term: Adoption of self-care skills and reduction of barriers for early signs and symptoms recognition and intervention leading to self-care maintenance.             Core Components/Risk Factors/Patient Goals Review:   Goals and Risk Factor Review     Row Name 06/24/22 1433 07/24/22 1432           Core Components/Risk Factors/Patient Goals Review   Personal Goals Review Weight Management/Obesity;Improve shortness of breath with ADL's;Develop more efficient breathing techniques such as purse lipped breathing and diaphragmatic breathing and practicing self-pacing with activity. Weight Management/Obesity;Improve shortness of breath with ADL's;Develop more efficient breathing techniques such as purse lipped breathing and diaphragmatic breathing and practicing self-pacing with activity.      Review Patient is new to the program completing  5 sessions.  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  His personal goals for the program are to lose weight and decrease his SOB nwith daily ADL's . Weight are averaging around 122.2kg.   We will continue to monitor his progress as he works toward meeting these goals. Patient has completed 14 sessions.   His personal goals for the program are to lose weight and decrease his SOB with daily ADL's . Weight are averaging around 123.9kg.  He  tolerated exericise well with O2sats averaging around 93%-95% without supplemental oxygen, only room air.  He tolerates using the Arm ergometer and the Nu Step.  We encourage purse lipped breathing technique .   We will continue to monitor as he works hard toward meeting thest goals.      Expected Outcomes [facility] will complete the program meeting both program and personal goals at discharge. [facility] will complete the program meeting both program and personal goals at discharge.               Core Components/Risk Factors/Patient Goals at Discharge (Final Review):   Goals and Risk Factor Review - 07/24/22 1432       Core Components/Risk Factors/Patient Goals Review   Personal Goals Review Weight Management/Obesity;Improve shortness of breath with ADL's;Develop more efficient breathing techniques such as purse lipped breathing and diaphragmatic breathing and practicing self-pacing with activity.    Review Patient has completed 14 sessions.   His personal goals for the program are to lose weight and decrease his SOB with daily ADL's . Weight are averaging around 123.9kg.  He tolerated exericise well with O2sats averaging around 93%-95% without supplemental oxygen, only room air.  He tolerates using the Arm ergometer and the Nu Step.  We encourage purse lipped breathing technique .   We will continue to monitor as he works hard toward meeting thest goals.    Expected Outcomes [facility] will complete the program meeting both program and personal  goals at discharge.             ITP Comments:   Comments: ITP REVIEW Pt is making expected progress toward pulmonary rehab goals after completing 17 sessions. Recommend continued exercise, life style modification, education, and utilization of breathing techniques to increase stamina and strength and decrease shortness of breath with exertion.

## 2022-07-31 NOTE — Progress Notes (Signed)
Pulmonary Individual Treatment Plan  Patient Details  Name: Vincent Cooper MRN: [id number] Date of Birth: November 07, 1941 Referring Provider:   Flowsheet Row PULMONARY REHAB OTHER RESP ORIENTATION from 06/03/2022 in [facility]  Referring Provider Dr. 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[place])  Patient's Home Medications on Admission:   Current Outpatient Medications:    albuterol (VENTOLIN HFA) 108 (90 Base) MCG/ACT inhaler, Inhale 1 puff into the lungs every 6 (six) hours as needed for wheezing or shortness of breath., Disp: , Rfl:    apixaban (ELIQUIS) 2.5 MG TABS tablet, Take 2.5 mg by mouth 2 (two) times daily., Disp: , Rfl:    betamethasone dipropionate 0.05 % cream, Apply 1 Application topically daily as needed (irritation)., Disp: , Rfl:    Budeson-Glycopyrrol-Formoterol (BREZTRI AEROSPHERE) 160-9-4.8 MCG/ACT AERO, Inhale 2 puffs into the lungs in the morning and at bedtime., Disp: 10.7 g, Rfl: 11   diltiazem (CARDIZEM CD) 180 MG 24 hr capsule, Take 360 mg by mouth every morning., Disp: , Rfl:    docusate sodium (COLACE) 100 MG capsule, Take 100 mg by mouth daily., Disp: , Rfl:    ipratropium-albuterol (DUONEB) 0.5-2.5 (3) MG/3ML SOLN, Take 3 mLs by nebulization every 6 (six) hours as needed (shortness of breath). , Disp: , Rfl:    Mepolizumab (NUCALA) 100 MG/ML SOAJ, Inject 1 mL (100 mg total) into the skin every 28 (twenty-eight) days., Disp: 3 mL, Rfl: 1   metoprolol tartrate (LOPRESSOR) 25 MG tablet, Take 1 tablet (25 mg total) by mouth 2 (two) times daily., Disp: 60 tablet, Rfl: 3   nitroGLYCERIN (NITROSTAT) 0.4 MG SL tablet, Place 0.4 mg under the tongue every 5 (five) minutes as needed for chest pain., Disp: , Rfl:    potassium chloride SA (KLOR-CON) 20 MEQ tablet, Take 20 mEq by mouth  daily., Disp: , Rfl:    predniSONE (DELTASONE) 1 MG tablet, Take 5 tablets daily for 1 week, 4 tablets daily for 1 week, 3 tablets daily for 1 week, 2 tablets daily for 1 week, 1 tablet daily for one week then stop, Disp: 105 tablet, Rfl: 0   rosuvastatin (CRESTOR) 40 MG tablet, TAKE 1 TABLET BY MOUTH ONCE DAILY. (Patient taking differently: Take 40 mg by mouth at bedtime.), Disp: 90 tablet, Rfl: 1   Spacer/Aero-Holding Chambers (AEROCHAMBER MV) inhaler, Use as instructed, Disp: 1 each, Rfl: 0   tamsulosin (FLOMAX) 0.4 MG CAPS capsule, Take 1 capsule (0.4 mg total) by mouth daily. (Patient taking differently: Take 0.4 mg by mouth at bedtime.), Disp: 30 capsule, Rfl: 1   torsemide (DEMADEX) 20 MG tablet, Take 1 tablet (20 mg total) by mouth 2 (two) times daily. May take an extra 20 mg if needed for increase swelling (Patient taking differently: Take 20-40 mg by mouth See admin instructions. 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 Large Dom RCA-<PDA-PAV/PL - diffuse mild-mod plaque: prox (25-49%), mid (50-69%) CTFFR (p 0.99, m 0.85, d 0.81 -  NS).  Med-Size LAD - prox-mid long diffuse Mod-Severe Ca++ plaque (~50-69%, ?>70%): CTFFR p 0.95, m 0.88, d 0.86 -NS.  CTFFR LCx 0.94, OM1 0.90 - NS. (NS=Not Significant).   Diabetes mellitus type II, non insulin dependent (HCC)    Hypertension    Morbid obesity (HCC)    BMI of 38.5 with multiple risk factors.   OSA on CPAP    Pulmonary emboli ([place]) 10/2019   Chest CTA-4 [place] opacification of main PA but there is partially occlusive main posterior RLL and segmental/segmental branches.  Small thrombus noted in the anterior right middle lobe.  No RV  strain.  Scattered aortic atherosclerosis involving great vessels.  Coronary calcification noted.   RLS (restless legs syndrome)     Tobacco Use: Social History   Tobacco Use  Smoking Status Former   Packs/day: 2.00   Years: 55.00   Total pack years: 110.00   Types: Cigarettes   Quit date: 11/18/2001   Years since quitting: 20.7  Smokeless Tobacco Former    Labs: Review Flowsheet  More data exists      Latest Ref Rng & Units 03/20/2020 06/17/2020 06/22/2020 06/23/2020 10/16/2020  Labs for ITP Cardiac and Pulmonary Rehab  Cholestrol 100 - 199 mg/dL 264  - - - -  LDL (calc) 0 - 99 mg/dL 135  - - - -  HDL-C >39 mg/dL 99  - - - -  Trlycerides <150 mg/dL 176  - 151  - -  Hemoglobin A1c 4.8 - 5.6 % - 5.4  - - 4.7   PH, Arterial 7.350 - 7.450 - - 7.421  7.399  -  PCO2 arterial 32.0 - 48.0 mmHg - - 36.4  39.3  -  Bicarbonate 20.0 - 28.0 mmol/L - - - 24.1  -  Acid-base deficit 0.0 - 2.0 mmol/L - - 0.6  0.4  -  O2 Saturation % - - >100.0  97.4  -    Capillary Blood Glucose: Lab Results  Component Value Date   GLUCAP 143 (H) 10/23/2020   GLUCAP 115 (H) 10/23/2020   GLUCAP 113 (H) 10/23/2020   GLUCAP 120 (H) 10/22/2020   GLUCAP 142 (H) 10/22/2020     Pulmonary Assessment Scores:  Pulmonary Assessment Scores     Row Name 06/03/22 1243         ADL UCSD   ADL Phase Entry     SOB Score total 50     Rest 0     Walk 2     Stairs 4     Bath 3     Dress 3     Shop 2       CAT Score   CAT Score 8       mMRC Score   mMRC Score 4             UCSD: Self-administered rating of dyspnea associated with activities of daily living (ADLs) 6-point scale (0 = "not at all" to 5 = "maximal or unable to do because of breathlessness")  Scoring Scores range from 0 to 120.  Minimally important difference is 5 units  CAT: CAT can identify the health impairment of COPD patients and is better correlated with disease progression.  CAT has a scoring range of zero to 40. The CAT score is  classified into four groups of low (less than 10), medium (10 - 20), high (21-30) and very high (31-40) based on the impact level of disease on health status. A CAT score over  10 suggests significant symptoms.  A worsening CAT score could be explained by an exacerbation, poor medication adherence, poor inhaler technique, or progression of COPD or comorbid conditions.  CAT MCID is 2 points  mMRC: mMRC (Modified Medical Research Council) Dyspnea Scale is used to assess the degree of baseline functional disability in patients of respiratory disease due to dyspnea. No minimal important difference is established. A decrease in score of 1 point or greater is considered a positive change.   Pulmonary Function Assessment:   Exercise Target Goals: Exercise Program Goal: Individual exercise prescription set using results from initial 6 min walk test and THRR while considering  patient's activity barriers and safety.   Exercise Prescription Goal: Initial exercise prescription builds to 30-45 minutes a day of aerobic activity, 2-3 days per week.  Home exercise guidelines will be given to patient during program as part of exercise prescription that the participant will acknowledge.  Activity Barriers & Risk Stratification:  Activity Barriers & Cardiac Risk Stratification - 06/03/22 1302       Activity Barriers & Cardiac Risk Stratification   Activity Barriers Deconditioning;Shortness of Breath;Assistive Device    Cardiac Risk Stratification High             6 Minute Walk:  6 Minute Walk     Row Name 06/03/22 1304         6 Minute Walk   Phase Initial     Distance 600 feet     Walk Time 6 minutes     # of Rest Breaks 1     MPH 1.13     METS 0.56     RPE 12     Perceived Dyspnea  11     VO2 Peak 1.99     Symptoms Yes (comment)     Comments One sitting break for 1 minute due to pt feeling weak and tired     Resting HR 85 bpm     Resting BP 112/76     Resting Oxygen Saturation  95  %     Exercise Oxygen Saturation  during 6 min walk 93 %     Max Ex. HR 126 bpm     Max Ex. BP 120/70     2 Minute Post BP 110/68       Interval HR   1 Minute HR 124     2 Minute HR 124     3 Minute HR 120     4 Minute HR 128     5 Minute HR 120     6 Minute HR 126     2 Minute Post HR 97     Interval Heart Rate? Yes       Interval Oxygen   Interval Oxygen? Yes     Baseline Oxygen Saturation % 95 %     1 Minute Oxygen Saturation % 92 %     1 Minute Liters of Oxygen 0 L     2 Minute Oxygen Saturation % 93 %     2 Minute Liters of Oxygen 0 L     3 Minute Oxygen Saturation % 94 %     3 Minute Liters of Oxygen 0 L     4 Minute Oxygen Saturation % 95 %     4 Minute Liters of Oxygen 0 L     5 Minute Oxygen Saturation % 94 %     5 Minute Liters of Oxygen 0 L     6  Minute Oxygen Saturation % 93 %     6 Minute Liters of Oxygen 0 L     2 Minute Post Oxygen Saturation % 96 %     2 Minute Post Liters of Oxygen 0 L              Oxygen Initial Assessment:  Oxygen Initial Assessment - 06/03/22 1305       Home Oxygen   Home Oxygen Device None    Sleep Oxygen Prescription CPAP    Home Exercise Oxygen Prescription None    Home Resting Oxygen Prescription None    Compliance with Home Oxygen Use Yes      Initial 6 min Walk   Oxygen Used None      Program Oxygen Prescription   Program Oxygen Prescription None      Intervention   Short Term Goals To learn and understand importance of monitoring SPO2 with pulse oximeter and demonstrate accurate use of the pulse oximeter.;To learn and understand importance of maintaining oxygen saturations>88%;To learn and demonstrate proper pursed lip breathing techniques or other breathing techniques.     Long  Term Goals Verbalizes importance of monitoring SPO2 with pulse oximeter and return demonstration;Maintenance of O2 saturations>88%;Exhibits proper breathing techniques, such as pursed lip breathing or other method taught during program  session;Compliance with respiratory medication             Oxygen Re-Evaluation:  Oxygen Re-Evaluation     Row Name 07/02/22 1429 07/30/22 1434           Program Oxygen Prescription   Program Oxygen Prescription None None        Home Oxygen   Home Oxygen Device None None      Sleep Oxygen Prescription CPAP CPAP      Home Exercise Oxygen Prescription None None      Home Resting Oxygen Prescription -- None      Compliance with Home Oxygen Use Yes Yes        Goals/Expected Outcomes   Short Term Goals To learn and understand importance of monitoring SPO2 with pulse oximeter and demonstrate accurate use of the pulse oximeter.;To learn and understand importance of maintaining oxygen saturations>88%;To learn and demonstrate proper pursed lip breathing techniques or other breathing techniques.  To learn and understand importance of monitoring SPO2 with pulse oximeter and demonstrate accurate use of the pulse oximeter.;To learn and understand importance of maintaining oxygen saturations>88%;To learn and demonstrate proper pursed lip breathing techniques or other breathing techniques.       Long  Term Goals Verbalizes importance of monitoring SPO2 with pulse oximeter and return demonstration;Maintenance of O2 saturations>88%;Exhibits proper breathing techniques, such as pursed lip breathing or other method taught during program session;Compliance with respiratory medication Verbalizes importance of monitoring SPO2 with pulse oximeter and return demonstration;Maintenance of O2 saturations>88%;Exhibits proper breathing techniques, such as pursed lip breathing or other method taught during program session;Compliance with respiratory medication      Goals/Expected Outcomes compliance compliance               Oxygen Discharge (Final Oxygen Re-Evaluation):  Oxygen Re-Evaluation - 07/30/22 1434       Program Oxygen Prescription   Program Oxygen Prescription None      Home Oxygen   Home  Oxygen Device None    Sleep Oxygen Prescription CPAP    Home Exercise Oxygen Prescription None    Home Resting Oxygen Prescription None    Compliance with Home Oxygen Use Yes  Goals/Expected Outcomes   Short Term Goals To learn and understand importance of monitoring SPO2 with pulse oximeter and demonstrate accurate use of the pulse oximeter.;To learn and understand importance of maintaining oxygen saturations>88%;To learn and demonstrate proper pursed lip breathing techniques or other breathing techniques.     Long  Term Goals Verbalizes importance of monitoring SPO2 with pulse oximeter and return demonstration;Maintenance of O2 saturations>88%;Exhibits proper breathing techniques, such as pursed lip breathing or other method taught during program session;Compliance with respiratory medication    Goals/Expected Outcomes compliance             Initial Exercise Prescription:  Initial Exercise Prescription - 06/03/22 1300       Date of Initial Exercise RX and Referring Provider   Date 06/03/22    Referring Provider Dr. Verlee Monte    Expected Discharge Date 10/08/22      NuStep   Level 1    SPM 60    Minutes 17      Arm Ergometer   Level 1    RPM 30    Minutes 22      Prescription Details   Frequency (times per week) 2    Duration Progress to 30 minutes of continuous aerobic without signs/symptoms of physical distress      Intensity   THRR 40-80% of Max Heartrate 56-112    Ratings of Perceived Exertion 11-13    Perceived Dyspnea 0-4      Resistance Training   Training Prescription Yes    Weight 3    Reps 10-15             Perform Capillary Blood Glucose checks as needed.  Exercise Prescription Changes:   Exercise Prescription Changes     Row Name 06/18/22 1500 07/02/22 1400 07/16/22 1400 07/30/22 1400       Response to Exercise   Blood Pressure (Admit) 132/62 120/62 108/70 124/74    Blood Pressure (Exercise) 140/70 128/72 130/74 122/72    Blood Pressure  (Exit) 120/70 104/70 112/62 122/60    Heart Rate (Admit) 98 bpm 96 bpm 96 bpm 92 bpm    Heart Rate (Exercise) 116 bpm 118 bpm 114 bpm 107 bpm    Heart Rate (Exit) 92 bpm 98 bpm 85 bpm 90 bpm    Oxygen Saturation (Admit) 94 % 96 % 94 % 95 %    Oxygen Saturation (Exercise) 94 % 94 % 93 % 93 %    Oxygen Saturation (Exit) 94 % 96 % 94 % 96 %    Rating of Perceived Exertion (Exercise) '12 12 12 12    '$ Perceived Dyspnea (Exercise) '11 12 12 12    '$ Duration Continue with 30 min of aerobic exercise without signs/symptoms of physical distress. Continue with 30 min of aerobic exercise without signs/symptoms of physical distress. Continue with 30 min of aerobic exercise without signs/symptoms of physical distress. Continue with 30 min of aerobic exercise without signs/symptoms of physical distress.    Intensity THRR unchanged THRR unchanged THRR unchanged THRR unchanged      Progression   Progression Continue to progress workloads to maintain intensity without signs/symptoms of physical distress. Continue to progress workloads to maintain intensity without signs/symptoms of physical distress. Continue to progress workloads to maintain intensity without signs/symptoms of physical distress. Continue to progress workloads to maintain intensity without signs/symptoms of physical distress.      Resistance Training   Training Prescription Yes Yes Yes Yes    Weight '3 3 4 '$ 4  Reps 10-15 10-15 10-15 10-15    Time 10 Minutes 10 Minutes 10 Minutes 10 Minutes      NuStep   Level '1 2 2 2    '$ SPM 74 85 92 85    Minutes '22 22 22 22    '$ METs 1.8 1.8 2 1.9      Arm Ergometer   Level '1 2 2 2    '$ RPM 38 43 43 40    Minutes '17 17 17 17    '$ METs 1.4 1.4 1.6 1.5             Exercise Comments:   Exercise Goals and Review:   Exercise Goals     Row Name 07/02/22 1427 07/30/22 1431           Exercise Goals   Increase Physical Activity Yes Yes      Intervention Provide advice, education, support and  counseling about physical activity/exercise needs.;Develop an individualized exercise prescription for aerobic and resistive training based on initial evaluation findings, risk stratification, comorbidities and participant's personal goals. Provide advice, education, support and counseling about physical activity/exercise needs.;Develop an individualized exercise prescription for aerobic and resistive training based on initial evaluation findings, risk stratification, comorbidities and participant's personal goals.      Expected Outcomes Short Term: Attend rehab on a regular basis to increase amount of physical activity.;Long Term: Add in home exercise to make exercise part of routine and to increase amount of physical activity.;Long Term: Exercising regularly at least 3-5 days a week. Short Term: Attend rehab on a regular basis to increase amount of physical activity.;Long Term: Add in home exercise to make exercise part of routine and to increase amount of physical activity.;Long Term: Exercising regularly at least 3-5 days a week.      Increase Strength and Stamina Yes Yes      Intervention Develop an individualized exercise prescription for aerobic and resistive training based on initial evaluation findings, risk stratification, comorbidities and participant's personal goals.;Provide advice, education, support and counseling about physical activity/exercise needs. Develop an individualized exercise prescription for aerobic and resistive training based on initial evaluation findings, risk stratification, comorbidities and participant's personal goals.;Provide advice, education, support and counseling about physical activity/exercise needs.      Expected Outcomes Short Term: Increase workloads from initial exercise prescription for resistance, speed, and METs.;Short Term: Perform resistance training exercises routinely during rehab and add in resistance training at home;Long Term: Improve cardiorespiratory  fitness, muscular endurance and strength as measured by increased METs and functional capacity (6MWT) Short Term: Increase workloads from initial exercise prescription for resistance, speed, and METs.;Short Term: Perform resistance training exercises routinely during rehab and add in resistance training at home;Long Term: Improve cardiorespiratory fitness, muscular endurance and strength as measured by increased METs and functional capacity (6MWT)      Able to understand and use rate of perceived exertion (RPE) scale Yes Yes      Intervention Provide education and explanation on how to use RPE scale Provide education and explanation on how to use RPE scale      Expected Outcomes Short Term: Able to use RPE daily in rehab to express subjective intensity level;Long Term:  Able to use RPE to guide intensity level when exercising independently Short Term: Able to use RPE daily in rehab to express subjective intensity level;Long Term:  Able to use RPE to guide intensity level when exercising independently      Able to understand and use Dyspnea scale Yes Yes  Intervention Provide education and explanation on how to use Dyspnea scale Provide education and explanation on how to use Dyspnea scale      Expected Outcomes Short Term: Able to use Dyspnea scale daily in rehab to express subjective sense of shortness of breath during exertion;Long Term: Able to use Dyspnea scale to guide intensity level when exercising independently Short Term: Able to use Dyspnea scale daily in rehab to express subjective sense of shortness of breath during exertion;Long Term: Able to use Dyspnea scale to guide intensity level when exercising independently      Knowledge and understanding of Target Heart Rate Range (THRR) Yes Yes      Intervention Provide education and explanation of THRR including how the numbers were predicted and where they are located for reference Provide education and explanation of THRR including how the  numbers were predicted and where they are located for reference      Expected Outcomes Short Term: Able to state/look up THRR;Long Term: Able to use THRR to govern intensity when exercising independently;Short Term: Able to use daily as guideline for intensity in rehab Short Term: Able to state/look up THRR;Long Term: Able to use THRR to govern intensity when exercising independently;Short Term: Able to use daily as guideline for intensity in rehab      Understanding of Exercise Prescription Yes Yes      Intervention Provide education, explanation, and written materials on patient's individual exercise prescription Provide education, explanation, and written materials on patient's individual exercise prescription      Expected Outcomes Short Term: Able to explain program exercise prescription;Long Term: Able to explain home exercise prescription to exercise independently Short Term: Able to explain program exercise prescription;Long Term: Able to explain home exercise prescription to exercise independently               Exercise Goals Re-Evaluation :  Exercise Goals Re-Evaluation     Row Name 07/02/22 1427 07/30/22 1432           Exercise Goal Re-Evaluation   Exercise Goals Review Increase Physical Activity;Increase Strength and Stamina;Able to understand and use Dyspnea scale;Able to understand and use rate of perceived exertion (RPE) scale;Knowledge and understanding of Target Heart Rate Range (THRR);Understanding of Exercise Prescription Increase Physical Activity;Increase Strength and Stamina;Able to understand and use rate of perceived exertion (RPE) scale;Able to understand and use Dyspnea scale;Knowledge and understanding of Target Heart Rate Range (THRR);Understanding of Exercise Prescription      Comments Pt has completed 8 sessions of PR. He comes to class and is motivated. He has started to increase his workload on both sets of equipment. He is currently exercising at 1.8 METs on the  stepper. Will continue to montior and progress as able. Pt has completed 16 sessions of PR. He is eagar to come to class and is motivated to start. He is currently exercising 1.9 METs on the stepper. Will cotinue to monitor and progress as able.      Expected Outcomes Through exercise at home and at rehab, the patient will meet their stated goals. Through exercise at home and at rehab, the patient will meet their stated goals.               Discharge Exercise Prescription (Final Exercise Prescription Changes):  Exercise Prescription Changes - 07/30/22 1400       Response to Exercise   Blood Pressure (Admit) 124/74    Blood Pressure (Exercise) 122/72    Blood Pressure (Exit) 122/60  Heart Rate (Admit) 92 bpm    Heart Rate (Exercise) 107 bpm    Heart Rate (Exit) 90 bpm    Oxygen Saturation (Admit) 95 %    Oxygen Saturation (Exercise) 93 %    Oxygen Saturation (Exit) 96 %    Rating of Perceived Exertion (Exercise) 12    Perceived Dyspnea (Exercise) 12    Duration Continue with 30 min of aerobic exercise without signs/symptoms of physical distress.    Intensity THRR unchanged      Progression   Progression Continue to progress workloads to maintain intensity without signs/symptoms of physical distress.      Resistance Training   Training Prescription Yes    Weight 4    Reps 10-15    Time 10 Minutes      NuStep   Level 2    SPM 85    Minutes 22    METs 1.9      Arm Ergometer   Level 2    RPM 40    Minutes 17    METs 1.5             Nutrition:  Target Goals: Understanding of nutrition guidelines, daily intake of sodium '1500mg'$ , cholesterol '200mg'$ , calories 30% from fat and 7% or less from saturated fats, daily to have 5 or more servings of fruits and vegetables.  Biometrics:  Pre Biometrics - 06/03/22 1308       Pre Biometrics   Height '5\' 9"'$  (1.753 m)    Weight 122 kg    Waist Circumference 55 inches    Hip Circumference 56 inches    Waist to Hip Ratio  0.98 %    BMI (Calculated) 39.7    Triceps Skinfold 25 mm    % Body Fat 41.6 %    Grip Strength 23.3 kg    Flexibility 0 in    Single Leg Stand 0 seconds              Nutrition Therapy Plan and Nutrition Goals:  Nutrition Therapy & Goals - 06/24/22 1444       Personal Nutrition Goals   Comments Patient scored  21 on his diet assessment.  We offer 2 educational sessions on heart healthy nutrition with hanhouts and offer assistance with RD  referral if patient is interested.      Intervention Plan   Intervention Nutrition handout(s) given to patient.    Expected Outcomes Short Term Goal: Understand basic principles of dietary content, such as calories, fat, sodium, cholesterol and nutrients.             Nutrition Assessments:  Nutrition Assessments - 06/03/22 1245       MEDFICTS Scores   Pre Score 21            MEDIFICTS Score Key: ?70 Need to make dietary changes  40-70 Heart Healthy Diet ? 40 Therapeutic Level Cholesterol Diet   Picture Your Plate Scores: <81 Unhealthy dietary pattern with much room for improvement. 41-50 Dietary pattern unlikely to meet recommendations for good health and room for improvement. 51-60 More healthful dietary pattern, with some room for improvement.  >60 Healthy dietary pattern, although there may be some specific behaviors that could be improved.    Nutrition Goals Re-Evaluation:   Nutrition Goals Discharge (Final Nutrition Goals Re-Evaluation):   Psychosocial: Target Goals: Acknowledge presence or absence of significant depression and/or stress, maximize coping skills, provide positive support system. Participant is able to verbalize types and ability to use techniques  and skills needed for reducing stress and depression.  Initial Review & Psychosocial Screening:  Initial Psych Review & Screening - 06/03/22 1305       Initial Review   Current issues with None Identified      Family Dynamics   Good Support  System? Yes    Comments His daughter is his main support system.      Barriers   Psychosocial barriers to participate in program There are no identifiable barriers or psychosocial needs.      Screening Interventions   Interventions Encouraged to exercise    Expected Outcomes Long Term goal: The participant improves quality of Life and PHQ9 Scores as seen by post scores and/or verbalization of changes;Short Term goal: Identification and review with participant of any Quality of Life or Depression concerns found by scoring the questionnaire.             Quality of Life Scores:  Quality of Life - 06/03/22 1309       Quality of Life   Select Quality of Life      Quality of Life Scores   Health/Function Pre 25.72 %    Socioeconomic Pre 30 %    Psych/Spiritual Pre 30 %    Family Pre 30 %    GLOBAL Pre 27.92 %            Scores of 19 and below usually indicate a poorer quality of life in these areas.  A difference of  2-3 points is a clinically meaningful difference.  A difference of 2-3 points in the total score of the Quality of Life Index has been associated with significant improvement in overall quality of life, self-image, physical symptoms, and general health in studies assessing change in quality of life.   PHQ-9: Review Flowsheet       06/03/2022  Depression screen PHQ 2/9  Decreased Interest 0  Down, Depressed, Hopeless 0  PHQ - 2 Score 0  Altered sleeping 0  Tired, decreased energy 2  Change in appetite 0  Feeling bad or failure about yourself  0  Trouble concentrating 0  Moving slowly or fidgety/restless 0  Suicidal thoughts 0  PHQ-9 Score 2  Difficult doing work/chores Not difficult at all   Interpretation of Total Score  Total Score Depression Severity:  1-4 = Minimal depression, 5-9 = Mild depression, 10-14 = Moderate depression, 15-19 = Moderately severe depression, 20-27 = Severe depression   Psychosocial Evaluation and Intervention:   Psychosocial Evaluation - 06/03/22 1324       Psychosocial Evaluation & Interventions   Interventions Encouraged to exercise with the program and follow exercise prescription    Comments Pt has no barriers to participate in PR. He has no identifiable psychosocial issues. 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 His health conditions cause him to take frequent breaks while doing these activities and he has to sit while doing them. He reports that he has a good support system with his daughter. Her reports that he divorced his wife three years ago and he is no longer stressed now that he is divorced. His goals while in the program are to lose weight and to decrease his SOB with his ADL's. He is eager to begin the program.    Expected Outcomes Pt will continue to have no  identifiable psychosocial issues.    Continue Psychosocial Services  No Follow up required             Psychosocial Re-Evaluation:  Psychosocial Re-Evaluation     [place] Name 06/24/22 1426 07/24/22 1423           Psychosocial Re-Evaluation   Current issues with None Identified None Identified      Comments Patient is new to the program completing 5 sessions.  His initial QOL score is 27.92% and his PHQ-9 score was 2.  He seems to enjoy coming to the program.  He also interacts  with class and staff.  Patient seems to enjoy coming to class and demonstrates a very positive outlook.  We will continue to monitor as he progresses in the program.   He will continue to have no psychosicial issues identified. Patient has completed 14 sessions.   He seems to enjoy coming to the program.  Patient enjoys socializing and interacts well with class and staff, also very talkative and friendly.   He also demonstrates a very positive outlook and attitude,  he continues to  work hard in [place].  We will continue to monitor as he progresses in the program.   He will continue to have no psychosicial issues identified.      Expected Outcomes Patient will have no psychosocial barries indentified at discharge. Patient will have no psychosocial barries indentified at discharge.      Interventions Relaxation education;Encouraged to attend Pulmonary Rehabilitation for the exercise;Stress management education Relaxation education;Encouraged to attend Pulmonary Rehabilitation for the exercise;Stress management education      Continue Psychosocial Services  No Follow up required No Follow up required               Psychosocial Discharge (Final Psychosocial Re-Evaluation):  Psychosocial Re-Evaluation - 07/24/22 1423       Psychosocial Re-Evaluation   Current issues with None Identified    Comments Patient has completed 14 sessions.   He seems to enjoy coming to the program.  Patient enjoys socializing and interacts well with class and staff, also very talkative and friendly.   He also demonstrates a very positive outlook and attitude,  he continues to work hard in [place].  We will continue to monitor as he progresses in the program.   He will continue to have no psychosicial issues identified.    Expected Outcomes Patient will have no psychosocial barries indentified at discharge.    Interventions Relaxation education;Encouraged to attend Pulmonary Rehabilitation for the exercise;Stress management education    Continue Psychosocial Services  No Follow up required              Education: Education Goals: Education classes will be provided on a weekly basis, covering required topics. Participant will state understanding/return demonstration of topics presented.  Learning Barriers/Preferences:  Learning Barriers/Preferences - 06/03/22 1308       Learning Barriers/Preferences   Learning Barriers None    Learning Preferences Audio;Verbal Instruction              Education Topics: How Lungs Work and Diseases: - Discuss the anatomy of the lungs and diseases that can affect the lungs, such as COPD.   Exercise: -Discuss the importance of exercise, FITT principles of exercise, normal and abnormal responses to exercise, and how to exercise safely.   Environmental Irritants: -Discuss types of environmental irritants and how to limit exposure to environmental irritants.   Meds/Inhalers and oxygen: - Discuss respiratory medications, definition of an  inhaler and oxygen, and the proper way to use an inhaler and oxygen. Flowsheet Row PULMONARY REHAB OTHER RESPIRATORY from 07/25/2022 in [place]  Date 06/06/22  Educator HJ       Energy Saving Techniques: - Discuss methods to conserve energy and decrease shortness of breath when performing activities of daily living.  Flowsheet Row PULMONARY REHAB OTHER RESPIRATORY from 07/25/2022 in [place]  Date 06/13/22  Educator pb  Instruction Review Code 1- Verbalizes Understanding       Bronchial Hygiene / Breathing Techniques: - Discuss breathing mechanics, pursed-lip breathing technique,  proper posture, effective ways to clear airways, and other functional breathing techniques Flowsheet Row PULMONARY REHAB OTHER RESPIRATORY from 07/25/2022 in [place]  Date 06/20/22  Educator DF  Instruction Review Code 1- Verbalizes Understanding       Cleaning Equipment: - Provides group verbal and written instruction about the health risks of elevated stress, cause of high stress, and healthy ways to reduce stress. Flowsheet Row PULMONARY REHAB OTHER RESPIRATORY from 07/25/2022 in [place]  Date 06/27/22  Educator pb  Instruction Review Code 1- Verbalizes Understanding       Nutrition I: Fats: - Discuss the types of cholesterol, what cholesterol does to the body, and how cholesterol levels can be  controlled. Flowsheet Row PULMONARY REHAB OTHER RESPIRATORY from 07/25/2022 in [place]  Date 07/04/22  Educator pb  Instruction Review Code 1- Verbalizes Understanding       Nutrition II: Labels: -Discuss the different components of food labels and how to read food labels. Flowsheet Row PULMONARY REHAB OTHER RESPIRATORY from 07/25/2022 in [place]  Date 07/11/22  Educator Handout  Instruction Review Code 1- Verbalizes Understanding       Respiratory Infections: - Discuss the signs and symptoms of respiratory infections, ways to prevent respiratory infections, and the importance of seeking medical treatment when having a respiratory infection. Flowsheet Row PULMONARY REHAB OTHER RESPIRATORY from 07/25/2022 in [place]  Date 07/25/22  Educator DM  Instruction Review Code 1- Verbalizes Understanding       Stress I: Signs and Symptoms: - Discuss the causes of stress, how stress may lead to anxiety and depression, and ways to limit stress.   Stress II: Relaxation: -Discuss relaxation techniques to limit stress.   Oxygen for Home/Travel: - Discuss how to prepare for travel when on oxygen and proper ways to transport and store oxygen to ensure safety.   Knowledge Questionnaire Score:  Knowledge Questionnaire Score - 06/03/22 1245       Knowledge Questionnaire Score   Pre Score 10/18             Core Components/Risk Factors/Patient Goals at Admission:  Personal Goals and Risk Factors at Admission - 06/03/22 1311       Core Components/Risk Factors/Patient Goals on Admission    Weight Management Yes;Obesity;Weight Loss    Intervention Obesity: Provide education and appropriate resources to help participant work on and attain dietary goals.;Weight Management/Obesity: Establish reasonable short term and long term weight goals.;Weight Management: Provide education and appropriate resources to help  participant work on and attain dietary goals.;Weight Management: Develop a combined nutrition and exercise program designed to reach desired caloric intake, while maintaining appropriate intake of nutrient and fiber, sodium and fats, and appropriate energy expenditure required for the weight goal.    Expected Outcomes Short Term: Continue to assess and modify interventions until short term weight  is achieved;Long Term: Adherence to nutrition and physical activity/exercise program aimed toward attainment of established weight goal;Weight Maintenance: Understanding of the daily nutrition guidelines, which includes 25-35% calories from fat, 7% or less cal from saturated fats, less than '200mg'$  cholesterol, less than 1.5gm of sodium, & 5 or more servings of fruits and vegetables daily;Understanding recommendations for meals to include 15-35% energy as protein, 25-35% energy from fat, 35-60% energy from carbohydrates, less than '200mg'$  of dietary cholesterol, 20-35 gm of total fiber daily;Understanding of distribution of calorie intake throughout the day with the consumption of 4-5 meals/snacks;Weight Gain: Understanding of general recommendations for a high calorie, high protein meal plan that promotes weight gain by distributing calorie intake throughout the day with the consumption for 4-5 meals, snacks, and/or supplements    Improve shortness of breath with ADL's Yes    Intervention Provide education, individualized exercise plan and daily activity instruction to help decrease symptoms of SOB with activities of daily living.    Expected Outcomes Short Term: Improve cardiorespiratory fitness to achieve a reduction of symptoms when performing ADLs;Long Term: Be able to perform more ADLs without symptoms or delay the onset of symptoms    Diabetes Yes    Intervention Provide education about signs/symptoms and action to take for hypo/hyperglycemia.;Provide education about proper nutrition, including hydration, and  aerobic/resistive exercise prescription along with prescribed medications to achieve blood glucose in normal ranges: Fasting glucose 65-99 mg/dL    Expected Outcomes Short Term: Participant verbalizes understanding of the signs/symptoms and immediate care of hyper/hypoglycemia, proper foot care and importance of medication, aerobic/resistive exercise and nutrition plan for blood glucose control.;Long Term: Attainment of HbA1C < 7%.    Heart Failure Yes    Intervention Provide a combined exercise and nutrition program that is supplemented with education, support and counseling about heart failure. Directed toward relieving symptoms such as shortness of breath, decreased exercise tolerance, and extremity edema.    Expected Outcomes Improve functional capacity of life;Short term: Attendance in program 2-3 days a week with increased exercise capacity. Reported lower sodium intake. Reported increased fruit and vegetable intake. Reports medication compliance.;Short term: Daily weights obtained and reported for increase. Utilizing diuretic protocols set by physician.;Long term: Adoption of self-care skills and reduction of barriers for early signs and symptoms recognition and intervention leading to self-care maintenance.             Core Components/Risk Factors/Patient Goals Review:   Goals and Risk Factor Review     [place] Name 06/24/22 1433 07/24/22 1432           Core Components/Risk Factors/Patient Goals Review   Personal Goals Review Weight Management/Obesity;Improve shortness of breath with ADL's;Develop more efficient breathing techniques such as purse lipped breathing and diaphragmatic breathing and practicing self-pacing with activity. Weight Management/Obesity;Improve shortness of breath with ADL's;Develop more efficient breathing techniques such as purse lipped breathing and diaphragmatic breathing and practicing self-pacing with activity.      Review Patient is new to the program completing  5 sessions.  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  His personal goals for the program are to lose weight and decrease his SOB nwith daily ADL's . Weight are averaging around 122.2kg.   We will continue to monitor his progress as he works toward meeting these goals. Patient has completed 14 sessions.   His personal goals for the program are to lose weight and decrease his SOB with daily ADL's . Weight are averaging around 123.9kg.  He  tolerated exericise well with O2sats averaging around 93%-95% without supplemental oxygen, only room air.  He tolerates using the Arm ergometer and the Nu Step.  We encourage purse lipped breathing technique .   We will continue to monitor as he works hard toward meeting thest goals.      Expected Outcomes [facility] will complete the program meeting both program and personal goals at discharge. [facility] will complete the program meeting both program and personal goals at discharge.               Core Components/Risk Factors/Patient Goals at Discharge (Final Review):   Goals and Risk Factor Review - 07/24/22 1432       Core Components/Risk Factors/Patient Goals Review   Personal Goals Review Weight Management/Obesity;Improve shortness of breath with ADL's;Develop more efficient breathing techniques such as purse lipped breathing and diaphragmatic breathing and practicing self-pacing with activity.    Review Patient has completed 14 sessions.   His personal goals for the program are to lose weight and decrease his SOB with daily ADL's . Weight are averaging around 123.9kg.  He tolerated exericise well with O2sats averaging around 93%-95% without supplemental oxygen, only room air.  He tolerates using the Arm ergometer and the Nu Step.  We encourage purse lipped breathing technique .   We will continue to monitor as he works hard toward meeting thest goals.    Expected Outcomes [facility] will complete the program meeting both program and personal  goals at discharge.             ITP Comments:   Comments: ITP REVIEW Pt is making expected progress toward pulmonary rehab goals after completing 17 sessions. Recommend continued exercise, life style modification, education, and utilization of breathing techniques to increase stamina and strength and decrease shortness of breath with exertion.

## 2022-08-01 ENCOUNTER — Encounter (HOSPITAL_COMMUNITY)
Admission: RE | Admit: 2022-08-01 | Discharge: 2022-08-01 | Disposition: A | Discharge status: Home / Self Care | Payer: Medicare HMO | Source: Ambulatory Visit | Attending: Student | Admitting: Student

## 2022-08-01 DIAGNOSIS — J432 Centrilobular emphysema: Secondary | ICD-10-CM | POA: Diagnosis not present

## 2022-08-01 NOTE — Progress Notes (Unsigned)
onar  Synopsis: Referred for COPD by Manon Hilding, MD  Subjective:   PATIENT ID: Vincent Cooper GENDER: male DOB: 1941-09-02, MRN: [id number]  No chief complaint on file.  81yM with history of COPD on trelegy 200 and systemic steroids and used to see pulmonologist in [place], CAD, DM2, OSA on CPAP, HTN, PE 10/2019 - he is unsure if he still taking eliquis due to this PE  He has DOE to 100 feet. Worsening gradually overall but can have episodic worsening. He does have a productive cough typically in the morning. No CP. Has been on trelegy for a couple years.   He has been on systemic steroids for 2 months. Down to 10 mg daily.   He has never tried pulmonary rehab before  Not smoking currently, quit 20 ya. 50 py+ smoking.   Uses CPAP every night. But does have trouble breathing if he lies down flat. Probably excessively sleepy during the day.   Never had asthma as a kid, does frequently have sinus congestion but no history of sinus surgeries.   He father had emphysema  He worked in home improvement. He has lived in [place] and [place]. No MJ or vaping.   Interval HPI: Started trying to taper off prednisone last visit  Has upcoming ventral hernia repair, has history of incarcerated hernias  Otherwise pertinent review of systems is negative.  Past Medical History:  Diagnosis Date   Chronic gout    COPD (chronic obstructive pulmonary disease) ([place])    Oxygen dependent   Coronary artery disease due to calcified coronary lesion 03/08/2020   CORONARY CTA: Cor Ca++ Score 1651 !!: ~ 50% LM (CTFFR 1 - NS). Large Dom RCA-<PDA-PAV/PL - diffuse mild-mod plaque: prox (25-49%), mid (50-69%) CTFFR (p 0.99, m 0.85, d 0.81 - NS).  Med-Size LAD - prox-mid long diffuse Mod-Severe Ca++ plaque (~50-69%, ?>70%): CTFFR p 0.95, m 0.88, d 0.86 -NS.  CTFFR LCx 0.94, OM1 0.90 - NS. (NS=Not Significant).   Diabetes mellitus type II, non insulin dependent (HCC)    Hypertension    Morbid obesity (HCC)    BMI of  38.5 with multiple risk factors.   OSA on CPAP    Pulmonary emboli ([place]) 10/2019   Chest CTA-4 Phadke opacification of main PA but there is partially occlusive main posterior RLL and segmental/segmental branches.  Small thrombus noted in the anterior right middle lobe.  No RV strain.  Scattered aortic atherosclerosis involving great vessels.  Coronary calcification noted.   RLS (restless legs syndrome)      Family History  Problem Relation Age of Onset   Colon cancer Mother    Emphysema Father        smoked   Diverticulitis Sister    Diabetes Brother    Diabetes Brother    Heart Problems Maternal Grandmother    Breast cancer Daughter    Lupus Son    CAD Neg Hx      Past Surgical History:  Procedure Laterality Date   BOWEL RESECTION N/A 10/18/2020   Procedure: SMALL BOWEL RESECTION;  Surgeon: Virl Cagey, MD;  Location: [facility];  Service: General;  Laterality: N/A;   Cataract surgery Right    COLONOSCOPY WITH PROPOFOL N/A 09/27/2020   Procedure: COLONOSCOPY WITH PROPOFOL;  Surgeon: Rogene Houston, MD;  Location: [facility];  Service: Endoscopy;  Laterality: N/A;  1:15   COLOSTOMY REVERSAL N/A 10/18/2020   Procedure: COLOSTOMY REVERSAL;  Surgeon: Virl Cagey, MD;  Location: [facility];  Service: General;  Laterality: N/A;   LAPAROTOMY N/A 06/21/2020   Procedure: EXPLORATORY LAPAROTOMY,  bowel resection, creation ostomy;  Surgeon: Virl Cagey, MD;  Location: [facility];  Service: General;  Laterality: N/A;   POLYPECTOMY  09/27/2020   Procedure: POLYPECTOMY;  Surgeon: Rogene Houston, MD;  Location: [facility];  Service: Endoscopy;;   TRANSTHORACIC ECHOCARDIOGRAM  10/20/2020   EF 60 to 65%.  Moderate LVH at least GR 1 DD.  Mild to moderate LA dilation.  Moderate aortic valve calcification-sclerosis with no stenosis.  Normal RAP.    Social History   Socioeconomic History   Marital status: Legally Separated    Spouse name: Not on file   Number of children: 3    Years of education: Not on file   Highest education level: Not on file  Occupational History   Occupation: retired  Tobacco Use   Smoking status: Former    Packs/day: 2.00    Years: 55.00    Total pack years: 110.00    Types: Cigarettes    Quit date: 11/18/2001    Years since quitting: 20.7   Smokeless tobacco: Former  Scientific laboratory technician Use: Never used  Substance and Sexual Activity   Alcohol use: Not Currently   Drug use: Never   Sexual activity: Not Currently  Other Topics Concern   Not on file  Social History Narrative   Not on file   Social Determinants of Health   Financial Resource Strain: Not on file  Food Insecurity: Not on file  Transportation Needs: Not on file  Physical Activity: Not on file  Stress: Not on file  Social Connections: Not on file  Intimate Partner Violence: Not on file     Allergies  Allergen Reactions   Other     Patient reports he was allergic to something in an IV he was given but does not know what the substance was. As of 01/19/2020      Outpatient Medications Prior to Visit  Medication Sig Dispense Refill   albuterol (VENTOLIN HFA) 108 (90 Base) MCG/ACT inhaler Inhale 1 puff into the lungs every 6 (six) hours as needed for wheezing or shortness of breath.     apixaban (ELIQUIS) 2.5 MG TABS tablet Take 2.5 mg by mouth 2 (two) times daily.     betamethasone dipropionate 0.05 % cream Apply 1 Application topically daily as needed (irritation).     Budeson-Glycopyrrol-Formoterol (BREZTRI AEROSPHERE) 160-9-4.8 MCG/ACT AERO Inhale 2 puffs into the lungs in the morning and at bedtime. 10.7 g 11   diltiazem (CARDIZEM CD) 180 MG 24 hr capsule Take 360 mg by mouth every morning.     docusate sodium (COLACE) 100 MG capsule Take 100 mg by mouth daily.     ipratropium-albuterol (DUONEB) 0.5-2.5 (3) MG/3ML SOLN Take 3 mLs by nebulization every 6 (six) hours as needed (shortness of breath).      Mepolizumab (NUCALA) 100 MG/ML SOAJ Inject 1 mL (100 mg  total) into the skin every 28 (twenty-eight) days. 3 mL 1   metoprolol tartrate (LOPRESSOR) 25 MG tablet Take 1 tablet (25 mg total) by mouth 2 (two) times daily. 60 tablet 3   nitroGLYCERIN (NITROSTAT) 0.4 MG SL tablet Place 0.4 mg under the tongue every 5 (five) minutes as needed for chest pain.     potassium chloride SA (KLOR-CON) 20 MEQ tablet Take 20 mEq by mouth daily.     predniSONE (DELTASONE) 1 MG tablet Take 5 tablets daily for  1 week, 4 tablets daily for 1 week, 3 tablets daily for 1 week, 2 tablets daily for 1 week, 1 tablet daily for one week then stop 105 tablet 0   rosuvastatin (CRESTOR) 40 MG tablet TAKE 1 TABLET BY MOUTH ONCE DAILY. (Patient taking differently: Take 40 mg by mouth at bedtime.) 90 tablet 1   Spacer/Aero-Holding Chambers (AEROCHAMBER MV) inhaler Use as instructed 1 each 0   tamsulosin (FLOMAX) 0.4 MG CAPS capsule Take 1 capsule (0.4 mg total) by mouth daily. (Patient taking differently: Take 0.4 mg by mouth at bedtime.) 30 capsule 1   torsemide (DEMADEX) 20 MG tablet Take 1 tablet (20 mg total) by mouth 2 (two) times daily. May take an extra 20 mg if needed for increase swelling (Patient taking differently: Take 20-40 mg by mouth See admin instructions. 40 mg in the morning, 20 mg in the evening) 200 tablet 3   No facility-administered medications prior to visit.       Objective:   Physical Exam:  General appearance: 81 y.o., male, NAD, conversant  Eyes: anicteric sclerae; PERRL, tracking appropriately HENT: NCAT; MMM Neck: Trachea midline; no lymphadenopathy, no JVD Lungs: good air movement bilaterally!, with normal work of breathing CV: RRR, no murmur  Abdomen: Soft, non-tender; non-distended, BS present  Extremities: 1+ pitting BLE edema, warm Skin: Normal turgor and texture; no rash Psych: Appropriate affect Neuro: Alert and oriented to person and place, no focal deficit     There were no vitals filed for this visit.     on RA BMI Readings  from Last 3 Encounters:  07/30/22 44.48 kg/m  07/16/22 44.09 kg/m  07/02/22 43.98 kg/m   Wt Readings from Last 3 Encounters:  07/30/22 275 lb 9.2 oz (125 kg)  07/16/22 273 lb 2.4 oz (123.9 kg)  07/02/22 272 lb 7.8 oz (123.6 kg)     CBC    Component Value Date/Time   WBC 9.7 06/15/2022 0725   RBC 4.44 06/15/2022 0725   HGB 14.7 06/15/2022 0725   HCT 42.4 06/15/2022 0725   PLT 158 06/15/2022 0725   MCV 95.5 06/15/2022 0725   MCH 33.1 06/15/2022 0725   MCHC 34.7 06/15/2022 0725   RDW 15.0 06/15/2022 0725   LYMPHSABS 1.1 06/15/2022 0725   MONOABS 0.9 06/15/2022 0725   EOSABS 0.1 06/15/2022 0725   BASOSABS 0.0 06/15/2022 0725    Remarkable eosinophilia historically  Chest Imaging: CTA Chest 2020 with a little upper lobe emphysema and bronchial wall thickening  CT A/P 06/15/22 lung bases with a few foci of scar otherwise unremarkable  Pulmonary Functions Testing Results:    Latest Ref Rng & Units 06/07/2022   12:49 PM  PFT Results  FVC-Pre L 2.61   FVC-Predicted Pre % 77   FVC-Post L 2.60   FVC-Predicted Post % 76   Pre FEV1/FVC % % 67   Post FEV1/FCV % % 67   FEV1-Pre L 1.75   FEV1-Predicted Pre % 73   FEV1-Post L 1.73   DLCO uncorrected ml/min/mmHg 16.34   DLCO UNC% % 76   DLCO corrected ml/min/mmHg 16.34   DLCO COR %Predicted % 76   DLVA Predicted % 87   TLC L 5.60   TLC % Predicted % 89   RV % Predicted % 116      Echocardiogram:   TTE 10/2020:  1. Left ventricular ejection fraction, by estimation, is 60 to 65%. The  left ventricle has normal function. The left ventricle has no regional  wall motion abnormalities. There is moderate left ventricular hypertrophy.  Left ventricular diastolic  parameters are indeterminate.   2. Right ventricular systolic function is normal. The right ventricular  size is normal.   3. Left atrial size was mild to moderately dilated.   4. The mitral valve is normal in structure. No evidence of mitral valve   regurgitation. No evidence of mitral stenosis.   5. The aortic valve has an indeterminant number of cusps. There is  moderate calcification of the aortic valve. There is moderate thickening  of the aortic valve. Aortic valve regurgitation is not visualized. No  aortic stenosis is present.   6. The inferior vena cava is normal in size with greater than 50%  respiratory variability, suggesting right atrial pressure of 3 mmHg.      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 Excellent response to nucala. We will try to wean steroids off.   # OSA on CPAP [facility] is DME supplier  Plan: - continue nucala  - taper steroid dose as follows:  Prednisone 7.5 mg daily for 2 weeks, then 5 mg daily for 2 weeks, then 4 mg daily for 1 week, 3 mg daily for 1 week, 2 mg daily for 1 week, then 1 mg daily for 1 week then stop - if you feel worse after tapering steroids, go back to lowest possible dose where you feel ok and let our office know  - stay on breztri 2 puffs twice daily, rinse mouth/brush tongue after each use - albuterol as needed  - if feeling watery eyes, sinus congestion despite above measures, consider over the counter xyzal  - request download of CPAP from [facility] next visit - see you in 6 weeks or sooner if need be!    Maryjane Hurter, MD [facility] 08/01/2022 5:42 PM

## 2022-08-01 NOTE — Progress Notes (Signed)
Daily Session Note  Patient Details  Name: Vincent Cooper MRN: [id number] Date of Birth: [birth year] Referring Provider:   Flowsheet Row PULMONARY REHAB OTHER RESP ORIENTATION from 06/03/2022 in [place]  Referring Provider Dr. Verlee Monte       Encounter Date: 08/01/2022  Check In:  Session Check In - 08/01/22 1330       Check-In   Supervising physician immediately available to respond to emergencies [facility] MD immediately available    Physician(s) Dr Johney Frame    Location [facility]    Staff Present Chaitra Mast Hassell Done, RN, BSN;Heather Otho Ket, BS, Exercise Physiologist    Virtual Visit No    Medication changes reported     No    Fall or balance concerns reported    Yes    Comments Has not fallen, but he uses a walking cane for support. He admits to losing his balance but not falling.    Tobacco Cessation No Change    Warm-up and Cool-down Performed as group-led instruction    Resistance Training Performed Yes    VAD Patient? No    PAD/SET Patient? No      Pain Assessment   Currently in Pain? No/denies    Pain Score 0-No pain    Multiple Pain Sites No             Capillary Blood Glucose: No results found for this or any previous visit (from the past 24 hour(s)).    Social History   Tobacco Use  Smoking Status Former   Packs/day: 2.00   Years: 55.00   Total pack years: 110.00   Types: Cigarettes   Quit date: 11/18/2001   Years since quitting: 20.7  Smokeless Tobacco Former    Goals Met:  Proper associated with RPD/PD & O2 Sat Independence with exercise equipment Using PLB without cueing & demonstrates good technique Exercise tolerated well Queuing for purse lip breathing No report of concerns or symptoms today Strength training completed today  Goals Unmet:  Not Applicable  Comments: Checkout at 1430.   Dr. Kathie Dike is Medical Director for [facility].

## 2022-08-02 ENCOUNTER — Encounter: Payer: Self-pay | Admitting: Student

## 2022-08-02 ENCOUNTER — Ambulatory Visit (INDEPENDENT_AMBULATORY_CARE_PROVIDER_SITE_OTHER): Payer: Medicare HMO | Admitting: Student

## 2022-08-02 VITALS — BP 130/60 | HR 71 | Ht 66.0 in | Wt 277.0 lb

## 2022-08-02 DIAGNOSIS — J4550 Severe persistent asthma, uncomplicated: Secondary | ICD-10-CM | POA: Diagnosis not present

## 2022-08-02 NOTE — Patient Instructions (Signed)
-   continue nucala injection  - stay on breztri 2 puffs twice daily, rinse mouth/brush tongue after each use - albuterol as needed  - if feeling watery eyes, sinus congestion despite above measures, consider over the counter xyzal  - see you in 8 weeks!

## 2022-08-06 ENCOUNTER — Encounter (HOSPITAL_COMMUNITY)
Admission: RE | Admit: 2022-08-06 | Discharge: 2022-08-06 | Disposition: A | Discharge status: Home / Self Care | Payer: Medicare HMO | Source: Ambulatory Visit | Attending: Student | Admitting: Student

## 2022-08-06 DIAGNOSIS — J432 Centrilobular emphysema: Secondary | ICD-10-CM

## 2022-08-06 NOTE — Progress Notes (Signed)
Daily Session Note  Patient Details  Name: Vincent Cooper MRN: [id number] Date of Birth: July 13, 1941 Referring Provider:   Flowsheet Row PULMONARY REHAB OTHER RESP ORIENTATION from 06/03/2022 in [place]  Referring Provider Dr. Verlee Monte       Encounter Date: 08/06/2022  Check In:  Session Check In - 08/06/22 1325       Check-In   Supervising physician immediately available to respond to emergencies [facility] MD immediately available    Physician(s) Dr Harl Bowie    Location [facility]    Staff Present Geanie Cooley, RN;Teniyah Seivert Hassell Done, RN, Bjorn Loser, MS, ACSM-CEP, Exercise Physiologist    Virtual Visit No    Medication changes reported     No    Fall or balance concerns reported    Yes    Comments Has not fallen, but he uses a walking cane for support. He admits to losing his balance but not falling.    Warm-up and Cool-down Performed as group-led Higher education careers adviser Performed Yes    VAD Patient? No    PAD/SET Patient? No      Pain Assessment   Currently in Pain? No/denies    Pain Score 0-No pain    Multiple Pain Sites No             Capillary Blood Glucose: No results found for this or any previous visit (from the past 24 hour(s)).    Social History   Tobacco Use  Smoking Status Former   Packs/day: 2.00   Years: 55.00   Total pack years: 110.00   Types: Cigarettes   Quit date: 11/18/2001   Years since quitting: 20.7  Smokeless Tobacco Former    Goals Met:  Proper associated with RPD/PD & O2 Sat Independence with exercise equipment Using PLB without cueing & demonstrates good technique Exercise tolerated well Queuing for purse lip breathing No report of concerns or symptoms today Strength training completed today  Goals Unmet:  Not Applicable  Comments: checkout at 1430.   Dr. Kathie Dike is Medical Director for [facility].

## 2022-08-07 ENCOUNTER — Ambulatory Visit (INDEPENDENT_AMBULATORY_CARE_PROVIDER_SITE_OTHER): Payer: Medicare HMO | Admitting: General Surgery

## 2022-08-07 ENCOUNTER — Encounter: Payer: Self-pay | Admitting: General Surgery

## 2022-08-07 VITALS — BP 121/76 | HR 79 | Temp 98.0°F | Resp 18 | Ht 66.0 in | Wt 272.0 lb

## 2022-08-07 DIAGNOSIS — K439 Ventral hernia without obstruction or gangrene: Secondary | ICD-10-CM | POA: Diagnosis not present

## 2022-08-07 NOTE — Patient Instructions (Addendum)
Continue your pulmonary rehab. Keep hernias pushed in. Call if things change or the hernias worsen. Go to the Ed if they hernias get stuck out.   Hold your Eliquis 10/14 and 10/15 and 10/16 (day of surgery).

## 2022-08-07 NOTE — Progress Notes (Signed)
[facility] History and Physical  Reason for Referral:*** Referring Physician: ***  Chief Complaint   Follow-up     Vincent Cooper is a 81 y.o. male.  HPI: ***.  The *** started *** and has had a duration of ***.  It is associated with ***.  The *** is improved with ***, and is made worse with ***.    Quality*** Context***  Past Medical History:  Diagnosis Date  . Chronic gout   . COPD (chronic obstructive pulmonary disease) ([place])    Oxygen dependent  . Coronary artery disease due to calcified coronary lesion 03/08/2020   CORONARY CTA: Cor Ca++ Score 1651 !!: ~ 50% LM (CTFFR 1 - NS). Large Dom RCA-<PDA-PAV/PL - diffuse mild-mod plaque: prox (25-49%), mid (50-69%) CTFFR (p 0.99, m 0.85, d 0.81 - NS).  Med-Size LAD - prox-mid long diffuse Mod-Severe Ca++ plaque (~50-69%, ?>70%): CTFFR p 0.95, m 0.88, d 0.86 -NS.  CTFFR LCx 0.94, OM1 0.90 - NS. (NS=Not Significant).  . Diabetes mellitus type II, non insulin dependent ([place])   . Hypertension   . Morbid obesity (HCC)    BMI of 38.5 with multiple risk factors.  . OSA on CPAP   . Pulmonary emboli ([place]) 10/2019   Chest CTA-4 Phadke opacification of main PA but there is partially occlusive main posterior RLL and segmental/segmental branches.  Small thrombus noted in the anterior right middle lobe.  No RV strain.  Scattered aortic atherosclerosis involving great vessels.  Coronary calcification noted.  Marland Kitchen RLS (restless legs syndrome)     Past Surgical History:  Procedure Laterality Date  . BOWEL RESECTION N/A 10/18/2020   Procedure: SMALL BOWEL RESECTION;  Surgeon: Virl Cagey, MD;  Location: [facility];  Service: General;  Laterality: N/A;  . Cataract surgery Right   . COLONOSCOPY WITH PROPOFOL N/A 09/27/2020   Procedure: COLONOSCOPY WITH PROPOFOL;  Surgeon: Rogene Houston, MD;  Location[facility];  Service: Endoscopy;  Laterality: N/A;  1:15  . COLOSTOMY REVERSAL N/A 10/18/2020   Procedure: COLOSTOMY REVERSAL;   Surgeon: Virl Cagey, MD;  Location: [facility];  Service: General;  Laterality: N/A;  . LAPAROTOMY N/A 06/21/2020   Procedure: EXPLORATORY LAPAROTOMY,  bowel resection, creation ostomy;  Surgeon: Virl Cagey, MD;  Location: [facility];  Service: General;  Laterality: N/A;  . POLYPECTOMY  09/27/2020   Procedure: POLYPECTOMY;  Surgeon: Rogene Houston, MD;  Location[facility];  Service: Endoscopy;;  . TRANSTHORACIC ECHOCARDIOGRAM  10/20/2020   EF 60 to 65%.  Moderate LVH at least GR 1 DD.  Mild to moderate LA dilation.  Moderate aortic valve calcification-sclerosis with no stenosis.  Normal RAP.    Family History  Problem Relation Age of Onset  . Colon cancer Mother   . Emphysema Father        smoked  . Diverticulitis Sister   . Diabetes Brother   . Diabetes Brother   . Heart Problems Maternal Grandmother   . Breast cancer Daughter   . Lupus Son   . CAD Neg Hx     Social History   Tobacco Use  . Smoking status: Former    Packs/day: 2.00    Years: 55.00    Total pack years: 110.00    Types: Cigarettes    Quit date: 11/18/2001    Years since quitting: 20.7  . Smokeless tobacco: Former  Media planner  . Vaping Use: Never used  Substance Use Topics  . Alcohol use: Not  Currently  . Drug use: Never    Medications: {medication reviewed/display:3041432} Allergies as of 08/07/2022       Reactions   Other    Patient reports he was allergic to something in an IV he was given but does not know what the substance was. As of 01/19/2020         Medication List        Accurate as of August 07, 2022  3:20 PM. If you have any questions, ask your nurse or doctor.          STOP taking these medications    predniSONE 1 MG tablet Commonly known as: DELTASONE Stopped by: Virl Cagey, MD       TAKE these medications    AeroChamber MV inhaler Use as instructed   albuterol 108 (90 Base) MCG/ACT inhaler Commonly known as: VENTOLIN HFA Inhale 1 puff into  the lungs every 6 (six) hours as needed for wheezing or shortness of breath.   betamethasone dipropionate 0.05 % cream Apply 1 Application topically daily as needed (irritation).   Breztri Aerosphere 160-9-4.8 MCG/ACT Aero Generic drug: Budeson-Glycopyrrol-Formoterol Inhale 2 puffs into the lungs in the morning and at bedtime.   diltiazem 180 MG 24 hr capsule Commonly known as: CARDIZEM CD Take 360 mg by mouth every morning.   docusate sodium 100 MG capsule Commonly known as: COLACE Take 100 mg by mouth daily.   Eliquis 2.5 MG Tabs tablet Generic drug: apixaban Take 2.5 mg by mouth 2 (two) times daily.   ipratropium-albuterol 0.5-2.5 (3) MG/3ML Soln Commonly known as: DUONEB Take 3 mLs by nebulization every 6 (six) hours as needed (shortness of breath).   metoprolol tartrate 25 MG tablet Commonly known as: LOPRESSOR Take 1 tablet (25 mg total) by mouth 2 (two) times daily.   nitroGLYCERIN 0.4 MG SL tablet Commonly known as: NITROSTAT Place 0.4 mg under the tongue every 5 (five) minutes as needed for chest pain.   Nucala 100 MG/ML Soaj Generic drug: Mepolizumab Inject 1 mL (100 mg total) into the skin every 28 (twenty-eight) days.   potassium chloride SA 20 MEQ tablet Commonly known as: KLOR-CON M Take 20 mEq by mouth daily.   rosuvastatin 40 MG tablet Commonly known as: CRESTOR TAKE 1 TABLET BY MOUTH ONCE DAILY. What changed: when to take this   tamsulosin 0.4 MG Caps capsule Commonly known as: FLOMAX Take 1 capsule (0.4 mg total) by mouth daily. What changed: when to take this   torsemide 20 MG tablet Commonly known as: DEMADEX Take 1 tablet (20 mg total) by mouth 2 (two) times daily. May take an extra 20 mg if needed for increase swelling What changed:  how much to take when to take this additional instructions         ROS:  {Review of Systems:30496}  Blood pressure 121/76, pulse 79, temperature 98 F (36.7 C), temperature source Oral, resp. rate  18, height '5\' 6"'$  (1.676 m), weight 272 lb (123.4 kg), SpO2 93 %. Physical Exam  Results: No results found for this or any previous visit (from the past 48 hour(s)).  No results found.   Assessment & Plan:  Vincent Cooper is a 81 y.o. male with *** -*** -*** -Follow up ***  All questions were answered to the satisfaction of the patient and family***.  The risk and benefits of *** were discussed including but not limited to ***.  After careful consideration, Vincent Cooper has decided to ***.    Ria Comment  C Vincent Cooper 08/07/2022, 3:20 PM

## 2022-08-08 ENCOUNTER — Encounter (HOSPITAL_COMMUNITY)
Admission: RE | Admit: 2022-08-08 | Discharge: 2022-08-08 | Disposition: A | Discharge status: Home / Self Care | Payer: Medicare HMO | Source: Ambulatory Visit | Attending: Student | Admitting: Student

## 2022-08-08 DIAGNOSIS — J432 Centrilobular emphysema: Secondary | ICD-10-CM

## 2022-08-08 NOTE — H&P (Signed)
[facility] History and Physical    Chief Complaint   Follow-up     Vincent Cooper is a 81 y.o. male.  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 Vincent Cooper has been doing well but did present to Vincent ED with an incarcerated hernia with small bowel at his colostomy site closure that Dr. Arnoldo Morale was able to reduce in Vincent ED. Vincent Cooper went home and has done well. In Vincent same time frame Vincent Cooper was having some worsening of his SOB and COPD and was seeing pulmonary. Vincent Cooper was placed on steroids and sent to pulmonary rehab. Vincent Cooper has been doing well from Vincent rehab and is off Vincent steroids now. Pulmonary has reevaluated him and feel like Vincent Cooper is ok for surgery.   Vincent Cooper is on Eliquis for A fib and prior PE in 2021. Vincent Cooper last saw Cardiology 05/27/22 and Dr. Allison Quarry note says that for any procedures this can be held 2 days prior.  Vincent Cooper is otherwise stable from a cardiac standpoint. Vincent Cooper denies any chest pain.   Dr. Verlee Monte, Pulmonary, has given his recommendations for surgery. These are in Vincent letter section of Epic.  Past Medical History:  Diagnosis Date   Chronic gout    COPD (chronic obstructive pulmonary disease) ([place])    Oxygen dependent   Coronary artery disease due to calcified coronary lesion 03/08/2020   CORONARY CTA: Cor Ca++ Score 1651 !!: ~ 50% LM (CTFFR 1 - NS). Large Dom RCA-<PDA-PAV/PL - diffuse mild-mod plaque: prox (25-49%), mid (50-69%) CTFFR (p 0.99, m 0.85, d 0.81 - NS).  Med-Size LAD - prox-mid long diffuse Mod-Severe Ca++ plaque (~50-69%, ?>70%): CTFFR p 0.95, m 0.88, d 0.86 -NS.  CTFFR LCx 0.94, OM1 0.90 - NS. (NS=Not Significant).   Diabetes mellitus type II, non insulin dependent (HCC)    Hypertension    Morbid obesity (HCC)    BMI of 38.5 with multiple risk factors.   OSA on CPAP    Pulmonary emboli ([place]) 10/2019   Chest CTA-4 Phadke opacification of main PA but there is  partially occlusive main posterior RLL and segmental/segmental branches.  Small thrombus noted in Vincent anterior right middle lobe.  No RV strain.  Scattered aortic atherosclerosis involving great vessels.  Coronary calcification noted.   RLS (restless legs syndrome)     Past Surgical History:  Procedure Laterality Date   BOWEL RESECTION N/A 10/18/2020   Procedure: SMALL BOWEL RESECTION;  Surgeon: Virl Cagey, MD;  Location: [facility];  Service: General;  Laterality: N/A;   Cataract surgery Right    COLONOSCOPY WITH PROPOFOL N/A 09/27/2020   Procedure: COLONOSCOPY WITH PROPOFOL;  Surgeon: Rogene Houston, MD;  Location: [facility];  Service: Endoscopy;  Laterality: N/A;  1:15   COLOSTOMY REVERSAL N/A 10/18/2020   Procedure: COLOSTOMY REVERSAL;  Surgeon: Virl Cagey, MD;  Location: [facility];  Service: General;  Laterality: N/A;   LAPAROTOMY N/A 06/21/2020   Procedure: EXPLORATORY LAPAROTOMY,  bowel resection, creation ostomy;  Surgeon: Virl Cagey, MD;  Location: [facility];  Service: General;  Laterality: N/A;   POLYPECTOMY  09/27/2020   Procedure: POLYPECTOMY;  Surgeon: Rogene Houston, MD;  Location: [facility];  Service: Endoscopy;;   TRANSTHORACIC ECHOCARDIOGRAM  10/20/2020   EF 60 to 65%.  Moderate LVH at least GR 1 DD.  Mild to moderate LA dilation.  Moderate aortic valve calcification-sclerosis with no  stenosis.  Normal RAP.    Family History  Problem Relation Age of Onset   Colon cancer Mother    Emphysema Father        smoked   Diverticulitis Sister    Diabetes Brother    Diabetes Brother    Heart Problems Maternal Grandmother    Breast cancer Daughter    Lupus Son    CAD Neg Hx     Social History   Tobacco Use   Smoking status: Former    Packs/day: 2.00    Years: 55.00    Total pack years: 110.00    Types: Cigarettes    Quit date: 11/18/2001    Years since quitting: 20.7   Smokeless tobacco: Former  Scientific laboratory technician Use: Never used  Substance  Use Topics   Alcohol use: Not Currently   Drug use: Never    Medications: I have reviewed Vincent Cooper's current medications. Allergies as of 08/07/2022       Reactions   Other    Cooper reports Vincent Cooper was allergic to something in an IV Vincent Cooper was given but does not know what Vincent substance was. As of 01/19/2020         Medication List        Accurate as of August 07, 2022  3:20 PM. If you have any questions, ask your nurse or doctor.          STOP taking these medications    predniSONE 1 MG tablet Commonly known as: DELTASONE Stopped by: Virl Cagey, MD       TAKE these medications    AeroChamber MV inhaler Use as instructed   albuterol 108 (90 Base) MCG/ACT inhaler Commonly known as: VENTOLIN HFA Inhale 1 puff into Vincent lungs every 6 (six) hours as needed for wheezing or shortness of breath.   betamethasone dipropionate 0.05 % cream Apply 1 Application topically daily as needed (irritation).   Breztri Aerosphere 160-9-4.8 MCG/ACT Aero Generic drug: Budeson-Glycopyrrol-Formoterol Inhale 2 puffs into Vincent lungs in Vincent morning and at bedtime.   diltiazem 180 MG 24 hr capsule Commonly known as: CARDIZEM CD Take 360 mg by mouth every morning.   docusate sodium 100 MG capsule Commonly known as: COLACE Take 100 mg by mouth daily.   Eliquis 2.5 MG Tabs tablet Generic drug: apixaban Take 2.5 mg by mouth 2 (two) times daily.   ipratropium-albuterol 0.5-2.5 (3) MG/3ML Soln Commonly known as: DUONEB Take 3 mLs by nebulization every 6 (six) hours as needed (shortness of breath).   metoprolol tartrate 25 MG tablet Commonly known as: LOPRESSOR Take 1 tablet (25 mg total) by mouth 2 (two) times daily.   nitroGLYCERIN 0.4 MG SL tablet Commonly known as: NITROSTAT Place 0.4 mg under Vincent tongue every 5 (five) minutes as needed for chest pain.   Nucala 100 MG/ML Soaj Generic drug: Mepolizumab Inject 1 mL (100 mg total) into Vincent skin every 28 (twenty-eight)  days.   potassium chloride SA 20 MEQ tablet Commonly known as: KLOR-CON M Take 20 mEq by mouth daily.   rosuvastatin 40 MG tablet Commonly known as: CRESTOR TAKE 1 TABLET BY MOUTH ONCE DAILY. What changed: when to take this   tamsulosin 0.4 MG Caps capsule Commonly known as: FLOMAX Take 1 capsule (0.4 mg total) by mouth daily. What changed: when to take this   torsemide 20 MG tablet Commonly known as: DEMADEX Take 1 tablet (20 mg total) by mouth 2 (two) times daily. May take  an extra 20 mg if needed for increase swelling What changed:  how much to take when to take this additional instructions         ROS:  A comprehensive review of systems was negative except for: Gastrointestinal: positive for ventral hernia, reducible  Blood pressure 121/76, pulse 79, temperature 98 F (36.7 C), temperature source Oral, resp. rate 18, height '5\' 6"'$  (1.676 m), weight 272 lb (123.4 kg), SpO2 93 %. Physical Exam Vitals reviewed.  Constitutional:      Appearance: Vincent Cooper is obese.  HENT:     Head: Normocephalic.     Nose: Nose normal.  Eyes:     Extraocular Movements: Extraocular movements intact.  Cardiovascular:     Rate and Rhythm: Normal rate and regular rhythm.  Pulmonary:     Effort: Pulmonary effort is normal.  Abdominal:     General: There is no distension.     Palpations: Abdomen is soft.     Tenderness: There is no abdominal tenderness.     Hernia: A hernia is present.     Comments: Hernias around colostomy closure and on Vincent upper midline, reducible, nontender  Musculoskeletal:        General: Normal range of motion.     Cervical back: Normal range of motion.  Skin:    General: Skin is warm.  Neurological:     General: No focal deficit present.     Mental Status: Vincent Cooper is alert and oriented to person, place, and time.  Psychiatric:        Mood and Affect: Mood normal.        Behavior: Behavior normal.     Results: Personally reviewed- colostomy site with medial and  lateral aspect ventral hernias about 3-4cm defects with bowel, midline above incision about 2cm defect with fat  CLINICAL DATA:  81 year old male with abdominal pain. History of bowel perforation in 2021.   EXAM: CT ABDOMEN AND PELVIS WITH CONTRAST   TECHNIQUE: Multidetector CT imaging of Vincent abdomen and pelvis was performed using Vincent standard protocol following bolus administration of intravenous contrast.   RADIATION DOSE REDUCTION: This exam was performed according to Vincent departmental dose-optimization program which includes automated exposure control, adjustment of the mA and/or kV according to Cooper size and/or use of iterative reconstruction technique.   CONTRAST:  160m OMNIPAQUE IOHEXOL 300 MG/ML  SOLN   COMPARISON:  CT Abdomen and Pelvis 06/21/2020.   FINDINGS: Lower chest: Borderline cardiomegaly. No pericardial or pleural effusion. Chronic lung base atelectasis or scarring. 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   Hepatobiliary: Trace perihepatic free fluid along Vincent posterior right hepatic lobe. Rim calcified 2.2 cm gallstone in Vincent neck of Vincent gallbladder. No pericholecystic inflammation. No bile duct enlargement.   Pancreas: Partially atrophy.   Spleen: Negative.   Adrenals/Urinary Tract: Negative.   Stomach/Bowel: New ventral abdominal postoperative changes and bowel containing hernias (series 2, image 46). Herniated small bowel loop with a 3.4 cm hernia neck to Vincent left of midline (series 6, image 93). And more leftward herniated small bowel loop with a smaller roughly 2 cm hernia neck (series 2, image 46). Multiple fluid-filled, dilated, and mildly inflamed small bowel loops throughout that region. Superimposed small bowel anastomosis on series 2, image 55.   Stomach, duodenum and proximal jejunum are decompressed. Terminal ileum decompressed. Large bowel retained stool and diverticulosis. Normal appendix on   Vascular/Lymphatic: Aortoiliac  calcified atherosclerosis. Major arterial structures in Vincent abdomen and pelvis appear patent. No lymphadenopathy. Portal  venous system is patent.   Reproductive: Negative.   Other: No pelvic free fluid.   Musculoskeletal: Mild chronic L3 compression fracture, L1 compression fracture are stable. No acute osseous abnormality identified.   IMPRESSION: 1. Acute Small-bowel Obstruction appears secondary to two distinct incarcerated ventral abdominal hernias as seen on series 2, image 46. Underlying prior small bowel resection. Small volume of free fluid but no pneumoperitoneum.   2. Cholelithiasis. Large bowel diverticulosis. Aortic Atherosclerosis (ICD10-I70.0). Mild chronic lumbar compression fractures.     Electronically Signed   By: Genevie Ann M.D.   On: 06/15/2022 08:46  Assessment & Plan:  Vincent Cooper is a 81 y.o. male with ventral hernias at Vincent colostomy site medial and lateral edges it looks like on CT and a third area at Vincent top of Vincent midline incision. This is consistent with 3 areas of herniation. Total size of all 11cm, reducible.    I think Vincent Cooper is unfortunately  going to be prone to this due to his obesity and lung disease. We discussed potential open repair of all three in Vincent future as I do not think a laparoscopic repair with be possible due to scar and also his lung disease. This would require possibly three separate incisions.   All questions were answered to Vincent satisfaction of Vincent Cooper  Vincent Cooper will stay overnight for monitoring and Pulmonary toilet.  Dr. Verlee Monte recommendations: --Pre- and post-operative incentive spirometry performed frequently while awake --Early use of PAP postoperatively in setting of his OSA --Avoiding use of pancuronium during anesthesia. --Encourage mobility and incentive spirometry post-op --If persistent hypotension postoperatively consideration of stress dose steroids must be weighed against wound healing  Discussed risk of bleeding,  infection, use of mesh, recurrence because of his obesity and COPD.   Continue your pulmonary rehab. Keep hernias pushed in. Call if things change or Vincent hernias worsen. Go to Vincent Ed if they hernias get stuck out.   Hold your Eliquis 10/14 and 10/15 and 10/16 (day of surgery). Virl Cagey 08/07/2022, 3:20 PM

## 2022-08-08 NOTE — Progress Notes (Signed)
Daily Session Note  Patient Details  Name: Vincent Cooper MRN: [id number] Date of Birth: 11/10/1941 Referring Provider:   Flowsheet Row PULMONARY REHAB OTHER RESP ORIENTATION from 06/03/2022 in [place]  Referring Provider Dr. Verlee Monte       Encounter Date: 08/08/2022  Check In:  Session Check In - 08/08/22 1330       Check-In   Supervising physician immediately available to respond to emergencies [facility] MD immediately available    Physician(s) Dr. Harrington Challenger    Location [facility]    Staff Present Geanie Cooley, RN;Debra Wynetta Emery, RN, Bjorn Loser, MS, ACSM-CEP, Exercise Physiologist;Daphyne Hassell Done, RN, BSN    Virtual Visit No    Medication changes reported     No    Fall or balance concerns reported    Yes    Comments Has not fallen, but he uses a walking cane for support. He admits to losing his balance but not falling.    Tobacco Cessation No Change    Warm-up and Cool-down Performed as group-led instruction    Resistance Training Performed Yes    VAD Patient? No    PAD/SET Patient? No      Pain Assessment   Currently in Pain? No/denies    Pain Score 0-No pain    Multiple Pain Sites No             Capillary Blood Glucose: No results found for this or any previous visit (from the past 24 hour(s)).    Social History   Tobacco Use  Smoking Status Former   Packs/day: 2.00   Years: 55.00   Total pack years: 110.00   Types: Cigarettes   Quit date: 11/18/2001   Years since quitting: 20.7  Smokeless Tobacco Former    Goals Met:  Proper associated with RPD/PD & O2 Sat Independence with exercise equipment Exercise tolerated well No report of concerns or symptoms today Strength training completed today  Goals Unmet:  Not Applicable  Comments: check out @ 2:30pm   Dr. Kathie Dike is Medical Director for [facility] Pulmonary Rehab.

## 2022-08-13 ENCOUNTER — Encounter (HOSPITAL_COMMUNITY)
Admission: RE | Admit: 2022-08-13 | Discharge: 2022-08-13 | Disposition: A | Discharge status: Home / Self Care | Payer: Medicare HMO | Source: Ambulatory Visit | Attending: Student | Admitting: Student

## 2022-08-13 DIAGNOSIS — J432 Centrilobular emphysema: Secondary | ICD-10-CM | POA: Diagnosis not present

## 2022-08-13 NOTE — Progress Notes (Signed)
Daily Session Note  Patient Details  Name: Vincent Cooper MRN: [id number] Date of Birth: 01/15/1941 Referring Provider:   Flowsheet Row PULMONARY REHAB OTHER RESP ORIENTATION from 06/03/2022 in [place]  Referring Provider Dr. Verlee Monte       Encounter Date: 08/13/2022  Check In:  Session Check In - 08/13/22 1330       Check-In   Supervising physician immediately available to respond to emergencies [facility] MD immediately available    Physician(s) Dr Domenic Polite    Location [facility]    Staff Present Geanie Cooley, RN;Trudi Morgenthaler Hassell Done, RN, BSN;Heather Otho Ket, BS, Exercise Physiologist    Virtual Visit No    Medication changes reported     No    Fall or balance concerns reported    Yes    Comments Has not fallen, but he uses a walking cane for support. He admits to losing his balance but not falling.    Tobacco Cessation No Change    Warm-up and Cool-down Performed as group-led instruction    Resistance Training Performed Yes    VAD Patient? No    PAD/SET Patient? No      Pain Assessment   Currently in Pain? No/denies    Pain Score 0-No pain    Multiple Pain Sites No             Capillary Blood Glucose: No results found for this or any previous visit (from the past 24 hour(s)).    Social History   Tobacco Use  Smoking Status Former   Packs/day: 2.00   Years: 55.00   Total pack years: 110.00   Types: Cigarettes   Quit date: 11/18/2001   Years since quitting: 20.7  Smokeless Tobacco Former    Goals Met:  Proper associated with RPD/PD & O2 Sat Independence with exercise equipment Using PLB without cueing & demonstrates good technique Exercise tolerated well Personal goals reviewed No report of concerns or symptoms today Strength training completed today  Goals Unmet:  Not Applicable  Comments: Checkout at 1430.   Dr. Kathie Dike is Medical Director for [facility] Pulmonary Rehab.

## 2022-08-15 ENCOUNTER — Encounter (HOSPITAL_COMMUNITY)
Admission: RE | Admit: 2022-08-15 | Discharge: 2022-08-15 | Disposition: A | Discharge status: Home / Self Care | Payer: Medicare HMO | Source: Ambulatory Visit | Attending: Student | Admitting: Student

## 2022-08-15 DIAGNOSIS — J432 Centrilobular emphysema: Secondary | ICD-10-CM

## 2022-08-15 NOTE — Progress Notes (Signed)
Daily Session Note  Patient Details  Name: Vincent Cooper MRN: [id number] Date of Birth: November 27, 1940 Referring Provider:   Flowsheet Row PULMONARY REHAB OTHER RESP ORIENTATION from 06/03/2022 in [place]  Referring Provider Dr. Verlee Monte       Encounter Date: 08/15/2022  Check In:  Session Check In - 08/15/22 1330       Check-In   Supervising physician immediately available to respond to emergencies [facility] MD immediately available    Physician(s) Dr. Harl Bowie    Location [facility]    Staff Present Hoy Register, MS, ACSM-CEP, Exercise Physiologist;Heather Zigmund Daniel, Exercise Physiologist    Virtual Visit No    Medication changes reported     No    Fall or balance concerns reported    Yes    Comments Has not fallen, but he uses a walking cane for support. He admits to losing his balance but not falling.    Tobacco Cessation No Change    Warm-up and Cool-down Performed as group-led instruction    Resistance Training Performed Yes    VAD Patient? No    PAD/SET Patient? No      Pain Assessment   Currently in Pain? No/denies    Pain Score 0-No pain    Multiple Pain Sites No             Capillary Blood Glucose: No results found for this or any previous visit (from the past 24 hour(s)).    Social History   Tobacco Use  Smoking Status Former   Packs/day: 2.00   Years: 55.00   Total pack years: 110.00   Types: Cigarettes   Quit date: 11/18/2001   Years since quitting: 20.7  Smokeless Tobacco Former    Goals Met:  Proper associated with RPD/PD & O2 Sat Using PLB without cueing & demonstrates good technique Exercise tolerated well Queuing for purse lip breathing No report of concerns or symptoms today Strength training completed today  Goals Unmet:  Not Applicable  Comments: checkout time is 1430   Dr. Kathie Dike is Medical Director for [facility] Pulmonary Rehab.

## 2022-08-20 ENCOUNTER — Other Ambulatory Visit (HOSPITAL_COMMUNITY): Payer: Self-pay

## 2022-08-20 ENCOUNTER — Encounter (HOSPITAL_COMMUNITY)
Admission: RE | Admit: 2022-08-20 | Discharge: 2022-08-20 | Disposition: A | Discharge status: Home / Self Care | Payer: Medicare HMO | Source: Ambulatory Visit | Attending: Student | Admitting: Student

## 2022-08-20 DIAGNOSIS — J432 Centrilobular emphysema: Secondary | ICD-10-CM | POA: Diagnosis not present

## 2022-08-20 NOTE — Progress Notes (Signed)
Daily Session Note  Patient Details  Name: Vincent Cooper MRN: [id number] Date of Birth: [birth year] Referring Provider:   Flowsheet Row PULMONARY REHAB OTHER RESP ORIENTATION from 06/03/2022 in [place]  Referring Provider Dr. Verlee Monte       Encounter Date: 08/20/2022  Check In:  Session Check In - 08/20/22 1328       Check-In   Supervising physician immediately available to respond to emergencies [facility] MD immediately available    Physician(s) Dr. Harl Bowie    Location [facility]    Staff Present Redge Gainer, BS, Exercise Physiologist;Layla Gramm Hassell Done, RN, BSN;Phyllis Billingsley, RN    Virtual Visit No    Medication changes reported     No    Fall or balance concerns reported    Yes    Comments Has not fallen, but he uses a walking cane for support. He admits to losing his balance but not falling.    Tobacco Cessation No Change    Warm-up and Cool-down Performed as group-led instruction    Resistance Training Performed Yes    VAD Patient? No    PAD/SET Patient? No      Pain Assessment   Currently in Pain? No/denies    Pain Score 0-No pain    Multiple Pain Sites No             Capillary Blood Glucose: No results found for this or any previous visit (from the past 24 hour(s)).    Social History   Tobacco Use  Smoking Status Former   Packs/day: 2.00   Years: 55.00   Total pack years: 110.00   Types: Cigarettes   Quit date: 11/18/2001   Years since quitting: 20.7  Smokeless Tobacco Former    Goals Met:  Proper associated with RPD/PD & O2 Sat Independence with exercise equipment Using PLB without cueing & demonstrates good technique Exercise tolerated well Queuing for purse lip breathing No report of concerns or symptoms today Strength training completed today  Goals Unmet:  Not Applicable  Comments: Checkout at 1430.   Dr. Kathie Dike is Medical Director for [facility] Pulmonary Rehab.

## 2022-08-21 ENCOUNTER — Other Ambulatory Visit (HOSPITAL_COMMUNITY): Payer: Self-pay

## 2022-08-22 ENCOUNTER — Encounter (HOSPITAL_COMMUNITY)
Admission: RE | Admit: 2022-08-22 | Discharge: 2022-08-22 | Disposition: A | Discharge status: Home / Self Care | Payer: Medicare HMO | Source: Ambulatory Visit | Attending: Student | Admitting: Student

## 2022-08-22 DIAGNOSIS — J432 Centrilobular emphysema: Secondary | ICD-10-CM

## 2022-08-22 NOTE — Progress Notes (Signed)
Daily Session Note  Patient Details  Name: Vincent Cooper MRN: [id number] Date of Birth: [birth year] Referring Provider:   Flowsheet Row PULMONARY REHAB OTHER RESP ORIENTATION from 06/03/2022 in [place]  Referring Provider Dr. Verlee Monte       Encounter Date: 08/22/2022  Check In:  Session Check In - 08/22/22 1326       Check-In   Supervising physician immediately available to respond to emergencies [facility] MD immediately available    Physician(s) Dr. Harrington Challenger    Location [facility]    Staff Present Hoy Register, MS, ACSM-CEP, Exercise Physiologist;Heather Zigmund Daniel, Exercise Physiologist    Virtual Visit No    Medication changes reported     No    Fall or balance concerns reported    Yes    Comments Has not fallen, but he uses a walking cane for support. He admits to losing his balance but not falling.    Tobacco Cessation No Change    Warm-up and Cool-down Performed as group-led instruction    Resistance Training Performed Yes    VAD Patient? No    PAD/SET Patient? No      Pain Assessment   Currently in Pain? No/denies    Pain Score 0-No pain    Multiple Pain Sites No             Capillary Blood Glucose: No results found for this or any previous visit (from the past 24 hour(s)).    Social History   Tobacco Use  Smoking Status Former   Packs/day: 2.00   Years: 55.00   Total pack years: 110.00   Types: Cigarettes   Quit date: 11/18/2001   Years since quitting: 20.7  Smokeless Tobacco Former    Goals Met:  Proper associated with RPD/PD & O2 Sat Independence with exercise equipment Using PLB without cueing & demonstrates good technique Exercise tolerated well Queuing for purse lip breathing No report of concerns or symptoms today Strength training completed today  Goals Unmet:  Not Applicable  Comments: checkout time is 1430   Dr. Kathie Dike is Medical Director for [facility] Pulmonary Rehab.

## 2022-08-26 NOTE — Patient Instructions (Signed)
Vincent Cooper  08/26/2022     '@[email or web address]'$ @   Your procedure is scheduled on  09/02/2022.   Report to [name] at  0800 A.M.   Call this number if you have problems the morning of surgery:  [phone]   Remember:  Do not eat or drink after midnight.         Your last dose of eliquis should be on 08/30/2022.        Use your nebulizer and your inhalers before you come and bring your rescue inhalers with you.        DO NOT take any medications for diabetes the morning of your procedure.     Take these medicines the morning of surgery with A SIP OF WATER                                     diltiazem, metoprolol.     Do not wear jewelry, make-up or nail polish.  Do not wear lotions, powders, or perfumes, or deodorant.  Do not shave 48 hours prior to surgery.  Men may shave face and neck.  Do not bring valuables to the hospital.  [facility] is not responsible for any belongings or valuables.  Contacts, dentures or bridgework may not be worn into surgery.  Leave your suitcase in the car.  After surgery it may be brought to your room.  For patients admitted to the hospital, discharge time will be determined by your treatment team.   Patients discharged the day of surgery will not be allowed to drive home and must have someone with them for 24 hours.     Special instructions:   DO NOT smoke tobacco or vape for 24 hours before your procedure.    Please read over the following fact sheets that you were given. Coughing and Deep Breathing, Surgical Site Infection Prevention, Anesthesia Post-op Instructions, and Care and Recovery After Surgery      Open Hernia Repair, Adult, Care After What can I expect after the procedure? After the procedure, it is common to have: Mild discomfort. Slight bruising. Mild swelling. Pain in the belly (abdomen). A small amount of blood from the cut from surgery (incision). Follow these instructions at home: Your  doctor may give you more specific instructions. If you have problems, call your doctor. Medicines Take over-the-counter and prescription medicines only as told by your doctor. If told, take steps to prevent problems with pooping (constipation). You may need to: Drink enough fluid to keep your pee (urine) pale yellow. Take medicines. You will be told what medicines to take. Eat foods that are high in fiber. These include beans, whole grains, and fresh fruits and vegetables. Limit foods that are high in fat and sugar. These include fried or sweet foods. Ask your doctor if you should avoid driving or using machines while you are taking your medicine. Incision care  Follow instructions from your doctor about how to take care of your incision. Make sure you: Wash your hands with soap and water for at least 20 seconds before and after you change your bandage (dressing). If you cannot use soap and water, use hand sanitizer. Change your bandage. Leave stitches or skin glue in place for at least 2 weeks. Leave tape strips alone unless you are told to take them off. You may trim the edges of the tape strips  if they curl up. Check your incision every day for signs of infection. Check for: More redness, swelling, or pain. More fluid or blood. Warmth. Pus or a bad smell. Wear loose, soft clothing while your incision heals. Activity  Rest as told by your doctor. Do not lift anything that is heavier than 10 lb (4.5 kg), or the limit that you are told. Do not play contact sports until your doctor says that this is safe. If you were given a sedative during your procedure, do not drive or use machines until your doctor says that it is safe. A sedative is a medicine that helps you relax. Return to your normal activities when your doctor says that it is safe. General instructions Do not take baths, swim, or use a hot tub. Ask your doctor about taking showers or sponge baths. Hold a pillow over your belly  when you cough or sneeze. This helps with pain. Do not smoke or use any products that contain nicotine or tobacco. If you need help quitting, ask your doctor. Keep all follow-up visits. Contact a doctor if: You have any of these signs of infection in or around your incision: More redness, swelling, or pain. More fluid or blood. Warmth. Pus. A bad smell. You have a fever or chills. You have blood in your poop (stool). You have not pooped (had a bowel movement) in 2-3 days. Medicine does not help your pain. Get help right away if: You have chest pain, or you are short of breath. You feel faint or light-headed. You have very bad pain. You vomit and your pain is worse. You have pain, swelling, or redness in a leg. These symptoms may be an emergency. Get help right away. Call your local emergency services (911 in the [place] Do not wait to see if the symptoms will go away. Do not drive yourself to the hospital. Summary After this procedure, it is common to have mild discomfort, slight bruising, and mild swelling. Follow instructions from your doctor about how to take care of your cut from surgery (incision). Check every day for signs of infection. Do not lift heavy objects or play contact sports until your doctor says it is safe. Return to your normal activities as told by your doctor. This information is not intended to replace advice given to you by your health care provider. Make sure you discuss any questions you have with your health care provider. Document Revised: 06/19/2020 Document Reviewed: 06/19/2020 Elsevier Patient Education  [place] Anesthesia, Adult, Care After The following information offers guidance on how to care for yourself after your procedure. Your health care provider may also give you more specific instructions. If you have problems or questions, contact your health care provider. What can I expect after the procedure? After the procedure, it  is common for people to: Have pain or discomfort at the IV site. Have nausea or vomiting. Have a sore throat or hoarseness. Have trouble concentrating. Feel cold or chills. Feel weak, sleepy, or tired (fatigue). Have soreness and body aches. These can affect parts of the body that were not involved in surgery. Follow these instructions at home: For the time period you were told by your health care provider:  Rest. Do not participate in activities where you could fall or become injured. Do not drive or use machinery. Do not drink alcohol. Do not take sleeping pills or medicines that cause drowsiness. Do not make important decisions or sign legal documents. Do not take  care of children on your own. General instructions Drink enough fluid to keep your urine pale yellow. If you have sleep apnea, surgery and certain medicines can increase your risk for breathing problems. Follow instructions from your health care provider about wearing your sleep device: Anytime you are sleeping, including during daytime naps. While taking prescription pain medicines, sleeping medicines, or medicines that make you drowsy. Return to your normal activities as told by your health care provider. Ask your health care provider what activities are safe for you. Take over-the-counter and prescription medicines only as told by your health care provider. Do not use any products that contain nicotine or tobacco. These products include cigarettes, chewing tobacco, and vaping devices, such as e-cigarettes. These can delay incision healing after surgery. If you need help quitting, ask your health care provider. Contact a health care provider if: You have nausea or vomiting that does not get better with medicine. You vomit every time you eat or drink. You have pain that does not get better with medicine. You cannot urinate or have bloody urine. You develop a skin rash. You have a fever. Get help right away if: You have  trouble breathing. You have chest pain. You vomit blood. These symptoms may be an emergency. Get help right away. Call 911. Do not wait to see if the symptoms will go away. Do not drive yourself to the hospital. Summary After the procedure, it is common to have a sore throat, hoarseness, nausea, vomiting, or to feel weak, sleepy, or fatigue. For the time period you were told by your health care provider, do not drive or use machinery. Get help right away if you have difficulty breathing, have chest pain, or vomit blood. These symptoms may be an emergency. This information is not intended to replace advice given to you by your health care provider. Make sure you discuss any questions you have with your health care provider. Document Revised: 02/01/2022 Document Reviewed: 02/01/2022 Elsevier Patient Education  [place]. How to Use Chlorhexidine Before Surgery Chlorhexidine gluconate (CHG) is a germ-killing (antiseptic) solution that is used to clean the skin. It can get rid of the bacteria that normally live on the skin and can keep them away for about 24 hours. To clean your skin with CHG, you may be given: A CHG solution to use in the shower or as part of a sponge bath. A prepackaged cloth that contains CHG. Cleaning your skin with CHG may help lower the risk for infection: While you are staying in the intensive care unit of the hospital. If you have a vascular access, such as a central line, to provide short-term or long-term access to your veins. If you have a catheter to drain urine from your bladder. If you are on a ventilator. A ventilator is a machine that helps you breathe by moving air in and out of your lungs. After surgery. What are the risks? Risks of using CHG include: A skin reaction. Hearing loss, if CHG gets in your ears and you have a perforated eardrum. Eye injury, if CHG gets in your eyes and is not rinsed out. The CHG product catching fire. Make sure that you  avoid smoking and flames after applying CHG to your skin. Do not use CHG: If you have a chlorhexidine allergy or have previously reacted to chlorhexidine. On babies younger than 22 months of age. How to use CHG solution Use CHG only as told by your health care provider, and follow the instructions  on the label. Use the full amount of CHG as directed. Usually, this is one bottle. During a shower Follow these steps when using CHG solution during a shower (unless your health care provider gives you different instructions): Start the shower. Use your normal soap and shampoo to wash your face and hair. Turn off the shower or move out of the shower stream. Pour the CHG onto a clean washcloth. Do not use any type of brush or rough-edged sponge. Starting at your neck, lather your body down to your toes. Make sure you follow these instructions: If you will be having surgery, pay special attention to the part of your body where you will be having surgery. Scrub this area for at least 1 minute. Do not use CHG on your head or face. If the solution gets into your ears or eyes, rinse them well with water. Avoid your genital area. Avoid any areas of skin that have broken skin, cuts, or scrapes. Scrub your back and under your arms. Make sure to wash skin folds. Let the lather sit on your skin for 1-2 minutes or as long as told by your health care provider. Thoroughly rinse your entire body in the shower. Make sure that all body creases and crevices are rinsed well. Dry off with a clean towel. Do not put any substances on your body afterward--such as powder, lotion, or perfume--unless you are told to do so by your health care provider. Only use lotions that are recommended by the manufacturer. Put on clean clothes or pajamas. If it is the night before your surgery, sleep in clean sheets.  During a sponge bath Follow these steps when using CHG solution during a sponge bath (unless your health care provider  gives you different instructions): Use your normal soap and shampoo to wash your face and hair. Pour the CHG onto a clean washcloth. Starting at your neck, lather your body down to your toes. Make sure you follow these instructions: If you will be having surgery, pay special attention to the part of your body where you will be having surgery. Scrub this area for at least 1 minute. Do not use CHG on your head or face. If the solution gets into your ears or eyes, rinse them well with water. Avoid your genital area. Avoid any areas of skin that have broken skin, cuts, or scrapes. Scrub your back and under your arms. Make sure to wash skin folds. Let the lather sit on your skin for 1-2 minutes or as long as told by your health care provider. Using a different clean, wet washcloth, thoroughly rinse your entire body. Make sure that all body creases and crevices are rinsed well. Dry off with a clean towel. Do not put any substances on your body afterward--such as powder, lotion, or perfume--unless you are told to do so by your health care provider. Only use lotions that are recommended by the manufacturer. Put on clean clothes or pajamas. If it is the night before your surgery, sleep in clean sheets. How to use CHG prepackaged cloths Only use CHG cloths as told by your health care provider, and follow the instructions on the label. Use the CHG cloth on clean, dry skin. Do not use the CHG cloth on your head or face unless your health care provider tells you to. When washing with the CHG cloth: Avoid your genital area. Avoid any areas of skin that have broken skin, cuts, or scrapes. Before surgery Follow these steps when using a  CHG cloth to clean before surgery (unless your health care provider gives you different instructions): Using the CHG cloth, vigorously scrub the part of your body where you will be having surgery. Scrub using a back-and-forth motion for 3 minutes. The area on your body should  be completely wet with CHG when you are done scrubbing. Do not rinse. Discard the cloth and let the area air-dry. Do not put any substances on the area afterward, such as powder, lotion, or perfume. Put on clean clothes or pajamas. If it is the night before your surgery, sleep in clean sheets.  For general bathing Follow these steps when using CHG cloths for general bathing (unless your health care provider gives you different instructions). Use a separate CHG cloth for each area of your body. Make sure you wash between any folds of skin and between your fingers and toes. Wash your body in the following order, switching to a new cloth after each step: The front of your neck, shoulders, and chest. Both of your arms, under your arms, and your hands. Your stomach and groin area, avoiding the genitals. Your right leg and foot. Your left leg and foot. The back of your neck, your back, and your buttocks. Do not rinse. Discard the cloth and let the area air-dry. Do not put any substances on your body afterward--such as powder, lotion, or perfume--unless you are told to do so by your health care provider. Only use lotions that are recommended by the manufacturer. Put on clean clothes or pajamas. Contact a health care provider if: Your skin gets irritated after scrubbing. You have questions about using your solution or cloth. You swallow any chlorhexidine. Call your local poison control center (1-910-674-0908 in the [place] Get help right away if: Your eyes itch badly, or they become very red or swollen. Your skin itches badly and is red or swollen. Your hearing changes. You have trouble seeing. You have swelling or tingling in your mouth or throat. You have trouble breathing. These symptoms may represent a serious problem that is an emergency. Do not wait to see if the symptoms will go away. Get medical help right away. Call your local emergency services (911 in the [place] Do not drive yourself to  the hospital. Summary Chlorhexidine gluconate (CHG) is a germ-killing (antiseptic) solution that is used to clean the skin. Cleaning your skin with CHG may help to lower your risk for infection. You may be given CHG to use for bathing. It may be in a bottle or in a prepackaged cloth to use on your skin. Carefully follow your health care provider's instructions and the instructions on the product label. Do not use CHG if you have a chlorhexidine allergy. Contact your health care provider if your skin gets irritated after scrubbing. This information is not intended to replace advice given to you by your health care provider. Make sure you discuss any questions you have with your health care provider. Document Revised: 03/04/2022 Document Reviewed: 01/15/2021 Elsevier Patient Education  [place].

## 2022-08-27 ENCOUNTER — Encounter (HOSPITAL_COMMUNITY)
Admission: RE | Admit: 2022-08-27 | Discharge: 2022-08-27 | Disposition: A | Discharge status: Home / Self Care | Payer: Medicare HMO | Source: Ambulatory Visit | Attending: Student | Admitting: Student

## 2022-08-27 VITALS — Wt 271.2 lb

## 2022-08-27 DIAGNOSIS — J432 Centrilobular emphysema: Secondary | ICD-10-CM

## 2022-08-27 NOTE — Progress Notes (Signed)
Daily Session Note  Patient Details  Name: Vincent Cooper MRN: [id number] Date of Birth: 1941/10/17 Referring Provider:   Flowsheet Row PULMONARY REHAB OTHER RESP ORIENTATION from 06/03/2022 in [place]  Referring Provider Dr. Verlee Monte       Encounter Date: 08/27/2022  Check In:  Session Check In - 08/27/22 1329       Check-In   Supervising physician immediately available to respond to emergencies [facility] MD immediately available    Physician(s) Dr. Marlou Porch    Location [facility]    Staff Present Redge Gainer, BS, Exercise Physiologist;Jionni Helming Hassell Done, RN, BSN    Virtual Visit No    Medication changes reported     No    Fall or balance concerns reported    Yes    Comments Has not fallen, but he uses a walking cane for support. He admits to losing his balance but not falling.    Tobacco Cessation No Change    Warm-up and Cool-down Performed as group-led instruction    Resistance Training Performed Yes    VAD Patient? No    PAD/SET Patient? No      Pain Assessment   Currently in Pain? No/denies    Pain Score 0-No pain    Multiple Pain Sites No             Capillary Blood Glucose: No results found for this or any previous visit (from the past 24 hour(s)).    Social History   Tobacco Use  Smoking Status Former   Packs/day: 2.00   Years: 55.00   Total pack years: 110.00   Types: Cigarettes   Quit date: 11/18/2001   Years since quitting: 20.7  Smokeless Tobacco Former    Goals Met:  Proper associated with RPD/PD & O2 Sat Independence with exercise equipment Using PLB without cueing & demonstrates good technique Exercise tolerated well Queuing for purse lip breathing No report of concerns or symptoms today Strength training completed today  Goals Unmet:  Not Applicable  Comments: Checkout at 1430.   Dr. Kathie Dike is Medical Director for [facility].

## 2022-08-28 ENCOUNTER — Encounter (HOSPITAL_COMMUNITY): Payer: Self-pay

## 2022-08-28 ENCOUNTER — Encounter (HOSPITAL_COMMUNITY)
Admission: RE | Admit: 2022-08-28 | Discharge: 2022-08-28 | Disposition: A | Discharge status: Home / Self Care | Payer: Medicare HMO | Source: Ambulatory Visit | Attending: General Surgery | Admitting: General Surgery

## 2022-08-28 VITALS — BP 142/66 | HR 79 | Temp 98.0°F | Resp 18 | Ht 66.0 in | Wt 271.2 lb

## 2022-08-28 DIAGNOSIS — Z01812 Encounter for preprocedural laboratory examination: Secondary | ICD-10-CM | POA: Diagnosis not present

## 2022-08-28 DIAGNOSIS — E119 Type 2 diabetes mellitus without complications: Secondary | ICD-10-CM | POA: Diagnosis not present

## 2022-08-28 LAB — BASIC METABOLIC PANEL
Anion gap: 12 (ref 5–15)
BUN: 19 mg/dL (ref 8–23)
CO2: 28 mmol/L (ref 22–32)
Calcium: 9.3 mg/dL (ref 8.9–10.3)
Chloride: 102 mmol/L (ref 98–111)
Creatinine, Ser: 1.3 mg/dL — ABNORMAL HIGH (ref 0.61–1.24)
GFR, Estimated: 56 mL/min — ABNORMAL LOW (ref >60)
Glucose, Bld: 159 mg/dL — ABNORMAL HIGH (ref 70–99)
Potassium: 3.8 mmol/L (ref 3.5–5.1)
Sodium: 142 mmol/L (ref 135–145)

## 2022-08-28 NOTE — Progress Notes (Signed)
Pulmonary Individual Treatment Plan  Patient Details  Name: Vincent Cooper MRN: [id number] Date of Birth: 06-Nov-1941 Referring Provider:   Flowsheet Row PULMONARY REHAB OTHER RESP ORIENTATION from 06/03/2022 in [place]  Referring Provider Dr. 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[place])  Patient's Home Medications on Admission:   Current Outpatient Medications:    albuterol (VENTOLIN HFA) 108 (90 Base) MCG/ACT inhaler, Inhale 1 puff into the lungs every 6 (six) hours as needed for wheezing or shortness of breath., Disp: , Rfl:    apixaban (ELIQUIS) 2.5 MG TABS tablet, Take 2.5 mg by mouth 2 (two) times daily., Disp: , Rfl:    Budeson-Glycopyrrol-Formoterol (BREZTRI AEROSPHERE) 160-9-4.8 MCG/ACT AERO, Inhale 2 puffs into the lungs in the morning and at bedtime., Disp: 10.7 g, Rfl: 11   diltiazem (CARDIZEM CD) 180 MG 24 hr capsule, Take 360 mg by mouth every morning., Disp: , Rfl:    docusate sodium (COLACE) 100 MG capsule, Take 100 mg by mouth daily as needed for moderate constipation., Disp: , Rfl:    hydroxypropyl methylcellulose / hypromellose (ISOPTO TEARS / GONIOVISC) 2.5 % ophthalmic solution, Place 1 drop into both eyes as needed for dry eyes., Disp: , Rfl:    ipratropium-albuterol (DUONEB) 0.5-2.5 (3) MG/3ML SOLN, Take 3 mLs by nebulization every 6 (six) hours as needed (shortness of breath). , Disp: , Rfl:    Mepolizumab (NUCALA) 100 MG/ML SOAJ, Inject 1 mL (100 mg total) into the skin every 28 (twenty-eight) days., Disp: 3 mL, Rfl: 1   metoprolol tartrate (LOPRESSOR) 25 MG tablet, Take 1 tablet (25 mg total) by mouth 2 (two) times daily., Disp: 60 tablet, Rfl: 3   nitroGLYCERIN (NITROSTAT) 0.4 MG SL tablet, Place 0.4 mg under the tongue every 5 (five) minutes as needed for chest pain., Disp:  , Rfl:    NON FORMULARY, Pt uses a cpap nightly, Disp: , Rfl:    potassium chloride SA (KLOR-CON) 20 MEQ tablet, Take 20 mEq by mouth daily., Disp: , Rfl:    rosuvastatin (CRESTOR) 40 MG tablet, TAKE 1 TABLET BY MOUTH ONCE DAILY. (Patient taking differently: Take 40 mg by mouth at bedtime.), Disp: 90 tablet, Rfl: 1   Spacer/Aero-Holding Chambers (AEROCHAMBER MV) inhaler, Use as instructed, Disp: 1 each, Rfl: 0   tamsulosin (FLOMAX) 0.4 MG CAPS capsule, Take 1 capsule (0.4 mg total) by mouth daily. (Patient taking differently: Take 0.4 mg by mouth at bedtime.), Disp: 30 capsule, Rfl: 1   torsemide (DEMADEX) 20 MG tablet, Take 1 tablet (20 mg total) by mouth 2 (two) times daily. May take an extra 20 mg if needed for increase swelling (Patient taking differently: Take 20-40 mg by mouth See admin instructions. 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 Large Dom RCA-<PDA-PAV/PL - diffuse mild-mod plaque: prox (25-49%), mid (50-69%) CTFFR (p 0.99, m 0.85, d 0.81 - NS).  Med-Size LAD - prox-mid long diffuse Mod-Severe Ca++ plaque (~50-69%, ?>70%): CTFFR p 0.95,  m 0.88, d 0.86 -NS.  CTFFR LCx 0.94, OM1 0.90 - NS. (NS=Not Significant).   Diabetes mellitus type II, non insulin dependent (HCC)    Hypertension    Morbid obesity (HCC)    BMI of 38.5 with multiple risk factors.   OSA on CPAP    Pulmonary emboli ([place]) 10/2019   Chest CTA-4 [place] opacification of main PA but there is partially occlusive main posterior RLL and segmental/segmental branches.  Small thrombus noted in the anterior right middle lobe.  No RV strain.  Scattered aortic atherosclerosis involving great vessels.  Coronary  calcification noted.   RLS (restless legs syndrome)     Tobacco Use: Social History   Tobacco Use  Smoking Status Former   Packs/day: 2.00   Years: 55.00   Total pack years: 110.00   Types: Cigarettes   Quit date: 11/18/2001   Years since quitting: 20.7  Smokeless Tobacco Former    Labs: Review Flowsheet  More data exists      Latest Ref Rng & Units 03/20/2020 06/17/2020 06/22/2020 06/23/2020 10/16/2020  Labs for ITP Cardiac and Pulmonary Rehab  Cholestrol 100 - 199 mg/dL 264  - - - -  LDL (calc) 0 - 99 mg/dL 135  - - - -  HDL-C >39 mg/dL 99  - - - -  Trlycerides <150 mg/dL 176  - 151  - -  Hemoglobin A1c 4.8 - 5.6 % - 5.4  - - 4.7   PH, Arterial 7.350 - 7.450 - - 7.421  7.399  -  PCO2 arterial 32.0 - 48.0 mmHg - - 36.4  39.3  -  Bicarbonate 20.0 - 28.0 mmol/L - - - 24.1  -  Acid-base deficit 0.0 - 2.0 mmol/L - - 0.6  0.4  -  O2 Saturation % - - >100.0  97.4  -    Capillary Blood Glucose: Lab Results  Component Value Date   GLUCAP 143 (H) 10/23/2020   GLUCAP 115 (H) 10/23/2020   GLUCAP 113 (H) 10/23/2020   GLUCAP 120 (H) 10/22/2020   GLUCAP 142 (H) 10/22/2020     Pulmonary Assessment Scores:  Pulmonary Assessment Scores     Row Name 06/03/22 1243         ADL UCSD   ADL Phase Entry     SOB Score total 50     Rest 0     Walk 2     Stairs 4     Bath 3     Dress 3     Shop 2       CAT Score   CAT Score 8       mMRC Score   mMRC Score 4             UCSD: Self-administered rating of dyspnea associated with activities of daily living (ADLs) 6-point scale (0 = "not at all" to 5 = "maximal or unable to do because of breathlessness")  Scoring Scores range from 0 to 120.  Minimally important difference is 5 units  CAT: CAT can identify the health impairment of COPD patients and is better correlated with disease progression.  CAT has a scoring range of zero to 40. The CAT score is classified into four groups of low (less than 10), medium (10 - 20), high  (21-30) and very high (31-40) based on the impact level of disease on health status. A CAT score over 10 suggests significant symptoms.  A worsening CAT score could be explained by an exacerbation, poor  medication adherence, poor inhaler technique, or progression of COPD or comorbid conditions.  CAT MCID is 2 points  mMRC: mMRC (Modified Medical Research Council) Dyspnea Scale is used to assess the degree of baseline functional disability in patients of respiratory disease due to dyspnea. No minimal important difference is established. A decrease in score of 1 point or greater is considered a positive change.   Pulmonary Function Assessment:   Exercise Target Goals: Exercise Program Goal: Individual exercise prescription set using results from initial 6 min walk test and THRR while considering  patient's activity barriers and safety.   Exercise Prescription Goal: Initial exercise prescription builds to 30-45 minutes a day of aerobic activity, 2-3 days per week.  Home exercise guidelines will be given to patient during program as part of exercise prescription that the participant will acknowledge.  Activity Barriers & Risk Stratification:  Activity Barriers & Cardiac Risk Stratification - 06/03/22 1302       Activity Barriers & Cardiac Risk Stratification   Activity Barriers Deconditioning;Shortness of Breath;Assistive Device    Cardiac Risk Stratification High             6 Minute Walk:  6 Minute Walk     Row Name 06/03/22 1304         6 Minute Walk   Phase Initial     Distance 600 feet     Walk Time 6 minutes     # of Rest Breaks 1     MPH 1.13     METS 0.56     RPE 12     Perceived Dyspnea  11     VO2 Peak 1.99     Symptoms Yes (comment)     Comments One sitting break for 1 minute due to pt feeling weak and tired     Resting HR 85 bpm     Resting BP 112/76     Resting Oxygen Saturation  95 %     Exercise Oxygen Saturation  during 6 min walk 93 %     Max Ex. HR  126 bpm     Max Ex. BP 120/70     2 Minute Post BP 110/68       Interval HR   1 Minute HR 124     2 Minute HR 124     3 Minute HR 120     4 Minute HR 128     5 Minute HR 120     6 Minute HR 126     2 Minute Post HR 97     Interval Heart Rate? Yes       Interval Oxygen   Interval Oxygen? Yes     Baseline Oxygen Saturation % 95 %     1 Minute Oxygen Saturation % 92 %     1 Minute Liters of Oxygen 0 L     2 Minute Oxygen Saturation % 93 %     2 Minute Liters of Oxygen 0 L     3 Minute Oxygen Saturation % 94 %     3 Minute Liters of Oxygen 0 L     4 Minute Oxygen Saturation % 95 %     4 Minute Liters of Oxygen 0 L     5 Minute Oxygen Saturation % 94 %     5 Minute Liters of Oxygen 0 L     6 Minute Oxygen Saturation % 93 %     6 Minute Liters of Oxygen 0  L     2 Minute Post Oxygen Saturation % 96 %     2 Minute Post Liters of Oxygen 0 L              Oxygen Initial Assessment:  Oxygen Initial Assessment - 06/03/22 1305       Home Oxygen   Home Oxygen Device None    Sleep Oxygen Prescription CPAP    Home Exercise Oxygen Prescription None    Home Resting Oxygen Prescription None    Compliance with Home Oxygen Use Yes      Initial 6 min Walk   Oxygen Used None      Program Oxygen Prescription   Program Oxygen Prescription None      Intervention   Short Term Goals To learn and understand importance of monitoring SPO2 with pulse oximeter and demonstrate accurate use of the pulse oximeter.;To learn and understand importance of maintaining oxygen saturations>88%;To learn and demonstrate proper pursed lip breathing techniques or other breathing techniques.     Long  Term Goals Verbalizes importance of monitoring SPO2 with pulse oximeter and return demonstration;Maintenance of O2 saturations>88%;Exhibits proper breathing techniques, such as pursed lip breathing or other method taught during program session;Compliance with respiratory medication             Oxygen  Re-Evaluation:  Oxygen Re-Evaluation     Row Name 07/02/22 1429 07/30/22 1434 08/27/22 1424         Program Oxygen Prescription   Program Oxygen Prescription None None None       Home Oxygen   Home Oxygen Device None None None     Sleep Oxygen Prescription CPAP CPAP CPAP     Home Exercise Oxygen Prescription None None None     Home Resting Oxygen Prescription -- None None     Compliance with Home Oxygen Use Yes Yes Yes       Goals/Expected Outcomes   Short Term Goals To learn and understand importance of monitoring SPO2 with pulse oximeter and demonstrate accurate use of the pulse oximeter.;To learn and understand importance of maintaining oxygen saturations>88%;To learn and demonstrate proper pursed lip breathing techniques or other breathing techniques.  To learn and understand importance of monitoring SPO2 with pulse oximeter and demonstrate accurate use of the pulse oximeter.;To learn and understand importance of maintaining oxygen saturations>88%;To learn and demonstrate proper pursed lip breathing techniques or other breathing techniques.  To learn and understand importance of monitoring SPO2 with pulse oximeter and demonstrate accurate use of the pulse oximeter.;To learn and understand importance of maintaining oxygen saturations>88%;To learn and demonstrate proper pursed lip breathing techniques or other breathing techniques.      Long  Term Goals Verbalizes importance of monitoring SPO2 with pulse oximeter and return demonstration;Maintenance of O2 saturations>88%;Exhibits proper breathing techniques, such as pursed lip breathing or other method taught during program session;Compliance with respiratory medication Verbalizes importance of monitoring SPO2 with pulse oximeter and return demonstration;Maintenance of O2 saturations>88%;Exhibits proper breathing techniques, such as pursed lip breathing or other method taught during program session;Compliance with respiratory medication  Verbalizes importance of monitoring SPO2 with pulse oximeter and return demonstration;Maintenance of O2 saturations>88%;Exhibits proper breathing techniques, such as pursed lip breathing or other method taught during program session;Compliance with respiratory medication     Goals/Expected Outcomes compliance compliance compliance              Oxygen Discharge (Final Oxygen Re-Evaluation):  Oxygen Re-Evaluation - 08/27/22 1424       Program Oxygen  Prescription   Program Oxygen Prescription None      Home Oxygen   Home Oxygen Device None    Sleep Oxygen Prescription CPAP    Home Exercise Oxygen Prescription None    Home Resting Oxygen Prescription None    Compliance with Home Oxygen Use Yes      Goals/Expected Outcomes   Short Term Goals To learn and understand importance of monitoring SPO2 with pulse oximeter and demonstrate accurate use of the pulse oximeter.;To learn and understand importance of maintaining oxygen saturations>88%;To learn and demonstrate proper pursed lip breathing techniques or other breathing techniques.     Long  Term Goals Verbalizes importance of monitoring SPO2 with pulse oximeter and return demonstration;Maintenance of O2 saturations>88%;Exhibits proper breathing techniques, such as pursed lip breathing or other method taught during program session;Compliance with respiratory medication    Goals/Expected Outcomes compliance             Initial Exercise Prescription:  Initial Exercise Prescription - 06/03/22 1300       Date of Initial Exercise RX and Referring Provider   Date 06/03/22    Referring Provider Dr. Verlee Monte    Expected Discharge Date 10/08/22      NuStep   Level 1    SPM 60    Minutes 17      Arm Ergometer   Level 1    RPM 30    Minutes 22      Prescription Details   Frequency (times per week) 2    Duration Progress to 30 minutes of continuous aerobic without signs/symptoms of physical distress      Intensity   THRR 40-80% of  Max Heartrate 56-112    Ratings of Perceived Exertion 11-13    Perceived Dyspnea 0-4      Resistance Training   Training Prescription Yes    Weight 3    Reps 10-15             Perform Capillary Blood Glucose checks as needed.  Exercise Prescription Changes:   Exercise Prescription Changes     Row Name 06/18/22 1500 07/02/22 1400 07/16/22 1400 07/30/22 1400 08/15/22 1400     Response to Exercise   Blood Pressure (Admit) 132/62 120/62 108/70 124/74 120/32   Blood Pressure (Exercise) 140/70 128/72 130/74 122/72 132/80   Blood Pressure (Exit) 120/70 104/70 112/62 122/60 118/70   Heart Rate (Admit) 98 bpm 96 bpm 96 bpm 92 bpm 85 bpm   Heart Rate (Exercise) 116 bpm 118 bpm 114 bpm 107 bpm 108 bpm   Heart Rate (Exit) 92 bpm 98 bpm 85 bpm 90 bpm 90 bpm   Oxygen Saturation (Admit) 94 % 96 % 94 % 95 % 94 %   Oxygen Saturation (Exercise) 94 % 94 % 93 % 93 % 94 %   Oxygen Saturation (Exit) 94 % 96 % 94 % 96 % 96 %   Rating of Perceived Exertion (Exercise) '12 12 12 12 12   '$ Perceived Dyspnea (Exercise) '11 12 12 12 12   '$ Duration Continue with 30 min of aerobic exercise without signs/symptoms of physical distress. Continue with 30 min of aerobic exercise without signs/symptoms of physical distress. Continue with 30 min of aerobic exercise without signs/symptoms of physical distress. Continue with 30 min of aerobic exercise without signs/symptoms of physical distress. Continue with 30 min of aerobic exercise without signs/symptoms of physical distress.   Intensity THRR unchanged THRR unchanged THRR unchanged THRR unchanged THRR unchanged     Progression  Progression Continue to progress workloads to maintain intensity without signs/symptoms of physical distress. Continue to progress workloads to maintain intensity without signs/symptoms of physical distress. Continue to progress workloads to maintain intensity without signs/symptoms of physical distress. Continue to progress workloads to  maintain intensity without signs/symptoms of physical distress. Continue to progress workloads to maintain intensity without signs/symptoms of physical distress.     Resistance Training   Training Prescription Yes Yes Yes Yes Yes   Weight '3 3 4 4 4   '$ Reps 10-15 10-15 10-15 10-15 10-15   Time 10 Minutes 10 Minutes 10 Minutes 10 Minutes 10 Minutes     NuStep   Level '1 2 2 2 3   '$ SPM 74 85 92 85 85   Minutes '22 22 22 22 22   '$ METs 1.8 1.8 2 1.9 2     Arm Ergometer   Level '1 2 2 2 3   '$ RPM 38 43 43 40 49   Minutes '17 17 17 17 17   '$ METs 1.4 1.4 1.6 1.5 1.9    Row Name 08/27/22 1400             Response to Exercise   Blood Pressure (Admit) 108/68       Blood Pressure (Exercise) 124/70       Blood Pressure (Exit) 128/70       Heart Rate (Admit) 84 bpm       Heart Rate (Exercise) 93 bpm       Heart Rate (Exit) 84 bpm       Oxygen Saturation (Admit) 93 %       Oxygen Saturation (Exercise) 93 %       Oxygen Saturation (Exit) 95 %       Rating of Perceived Exertion (Exercise) 12       Perceived Dyspnea (Exercise) 12       Duration Continue with 30 min of aerobic exercise without signs/symptoms of physical distress.       Intensity THRR unchanged         Progression   Progression Continue to progress workloads to maintain intensity without signs/symptoms of physical distress.         Resistance Training   Training Prescription Yes       Weight 4       Reps 10-15       Time 10 Minutes         NuStep   Level 3       SPM 83       Minutes 22       METs 2         Arm Ergometer   Level 3.4       RPM 43       Minutes 17       METs 1.9                Exercise Comments:   Exercise Goals and Review:   Exercise Goals     Row Name 07/02/22 1427 07/30/22 1431 08/27/22 1421         Exercise Goals   Increase Physical Activity Yes Yes Yes     Intervention Provide advice, education, support and counseling about physical activity/exercise needs.;Develop an  individualized exercise prescription for aerobic and resistive training based on initial evaluation findings, risk stratification, comorbidities and participant's personal goals. Provide advice, education, support and counseling about physical activity/exercise needs.;Develop an individualized exercise prescription for aerobic and resistive training based on initial  evaluation findings, risk stratification, comorbidities and participant's personal goals. Provide advice, education, support and counseling about physical activity/exercise needs.;Develop an individualized exercise prescription for aerobic and resistive training based on initial evaluation findings, risk stratification, comorbidities and participant's personal goals.     Expected Outcomes Short Term: Attend rehab on a regular basis to increase amount of physical activity.;Long Term: Add in home exercise to make exercise part of routine and to increase amount of physical activity.;Long Term: Exercising regularly at least 3-5 days a week. Short Term: Attend rehab on a regular basis to increase amount of physical activity.;Long Term: Add in home exercise to make exercise part of routine and to increase amount of physical activity.;Long Term: Exercising regularly at least 3-5 days a week. Short Term: Attend rehab on a regular basis to increase amount of physical activity.;Long Term: Add in home exercise to make exercise part of routine and to increase amount of physical activity.;Long Term: Exercising regularly at least 3-5 days a week.     Increase Strength and Stamina Yes Yes Yes     Intervention Develop an individualized exercise prescription for aerobic and resistive training based on initial evaluation findings, risk stratification, comorbidities and participant's personal goals.;Provide advice, education, support and counseling about physical activity/exercise needs. Develop an individualized exercise prescription for aerobic and resistive training  based on initial evaluation findings, risk stratification, comorbidities and participant's personal goals.;Provide advice, education, support and counseling about physical activity/exercise needs. Develop an individualized exercise prescription for aerobic and resistive training based on initial evaluation findings, risk stratification, comorbidities and participant's personal goals.;Provide advice, education, support and counseling about physical activity/exercise needs.     Expected Outcomes Short Term: Increase workloads from initial exercise prescription for resistance, speed, and METs.;Short Term: Perform resistance training exercises routinely during rehab and add in resistance training at home;Long Term: Improve cardiorespiratory fitness, muscular endurance and strength as measured by increased METs and functional capacity (6MWT) Short Term: Increase workloads from initial exercise prescription for resistance, speed, and METs.;Short Term: Perform resistance training exercises routinely during rehab and add in resistance training at home;Long Term: Improve cardiorespiratory fitness, muscular endurance and strength as measured by increased METs and functional capacity (6MWT) Short Term: Increase workloads from initial exercise prescription for resistance, speed, and METs.;Short Term: Perform resistance training exercises routinely during rehab and add in resistance training at home;Long Term: Improve cardiorespiratory fitness, muscular endurance and strength as measured by increased METs and functional capacity (6MWT)     Able to understand and use rate of perceived exertion (RPE) scale Yes Yes Yes     Intervention Provide education and explanation on how to use RPE scale Provide education and explanation on how to use RPE scale Provide education and explanation on how to use RPE scale     Expected Outcomes Short Term: Able to use RPE daily in rehab to express subjective intensity level;Long Term:  Able to  use RPE to guide intensity level when exercising independently Short Term: Able to use RPE daily in rehab to express subjective intensity level;Long Term:  Able to use RPE to guide intensity level when exercising independently Short Term: Able to use RPE daily in rehab to express subjective intensity level;Long Term:  Able to use RPE to guide intensity level when exercising independently     Able to understand and use Dyspnea scale Yes Yes Yes     Intervention Provide education and explanation on how to use Dyspnea scale Provide education and explanation on how to use Dyspnea scale  Provide education and explanation on how to use Dyspnea scale     Expected Outcomes Short Term: Able to use Dyspnea scale daily in rehab to express subjective sense of shortness of breath during exertion;Long Term: Able to use Dyspnea scale to guide intensity level when exercising independently Short Term: Able to use Dyspnea scale daily in rehab to express subjective sense of shortness of breath during exertion;Long Term: Able to use Dyspnea scale to guide intensity level when exercising independently Short Term: Able to use Dyspnea scale daily in rehab to express subjective sense of shortness of breath during exertion;Long Term: Able to use Dyspnea scale to guide intensity level when exercising independently     Knowledge and understanding of Target Heart Rate Range (THRR) Yes Yes Yes     Intervention Provide education and explanation of THRR including how the numbers were predicted and where they are located for reference Provide education and explanation of THRR including how the numbers were predicted and where they are located for reference Provide education and explanation of THRR including how the numbers were predicted and where they are located for reference     Expected Outcomes Short Term: Able to state/look up THRR;Long Term: Able to use THRR to govern intensity when exercising independently;Short Term: Able to use  daily as guideline for intensity in rehab Short Term: Able to state/look up THRR;Long Term: Able to use THRR to govern intensity when exercising independently;Short Term: Able to use daily as guideline for intensity in rehab Short Term: Able to state/look up THRR;Long Term: Able to use THRR to govern intensity when exercising independently;Short Term: Able to use daily as guideline for intensity in rehab     Understanding of Exercise Prescription Yes Yes Yes     Intervention Provide education, explanation, and written materials on patient's individual exercise prescription Provide education, explanation, and written materials on patient's individual exercise prescription Provide education, explanation, and written materials on patient's individual exercise prescription     Expected Outcomes Short Term: Able to explain program exercise prescription;Long Term: Able to explain home exercise prescription to exercise independently Short Term: Able to explain program exercise prescription;Long Term: Able to explain home exercise prescription to exercise independently Short Term: Able to explain program exercise prescription;Long Term: Able to explain home exercise prescription to exercise independently              Exercise Goals Re-Evaluation :  Exercise Goals Re-Evaluation     Row Name 07/02/22 1427 07/30/22 1432 08/27/22 1421         Exercise Goal Re-Evaluation   Exercise Goals Review Increase Physical Activity;Increase Strength and Stamina;Able to understand and use Dyspnea scale;Able to understand and use rate of perceived exertion (RPE) scale;Knowledge and understanding of Target Heart Rate Range (THRR);Understanding of Exercise Prescription Increase Physical Activity;Increase Strength and Stamina;Able to understand and use rate of perceived exertion (RPE) scale;Able to understand and use Dyspnea scale;Knowledge and understanding of Target Heart Rate Range (THRR);Understanding of Exercise  Prescription Increase Strength and Stamina;Increase Physical Activity;Able to understand and use rate of perceived exertion (RPE) scale;Able to understand and use Dyspnea scale;Knowledge and understanding of Target Heart Rate Range (THRR);Understanding of Exercise Prescription     Comments Pt has completed 8 sessions of PR. He comes to class and is motivated. He has started to increase his workload on both sets of equipment. He is currently exercising at 1.8 METs on the stepper. Will continue to montior and progress as able. Pt has completed 16 sessions of  PR. He is eagar to come to class and is motivated to start. He is currently exercising 1.9 METs on the stepper. Will cotinue to monitor and progress as able. Pt has completed 24 sessions of PR. He is motivated during class and has increased his worklaod. He is currently exercising at 2.0 METs on the stepper. Will continue to monitor and progress as able.     Expected Outcomes Through exercise at home and at rehab, the patient will meet their stated goals. Through exercise at home and at rehab, the patient will meet their stated goals. Through exercise at home and at rehab, the patient will meet their stated goals.              Discharge Exercise Prescription (Final Exercise Prescription Changes):  Exercise Prescription Changes - 08/27/22 1400       Response to Exercise   Blood Pressure (Admit) 108/68    Blood Pressure (Exercise) 124/70    Blood Pressure (Exit) 128/70    Heart Rate (Admit) 84 bpm    Heart Rate (Exercise) 93 bpm    Heart Rate (Exit) 84 bpm    Oxygen Saturation (Admit) 93 %    Oxygen Saturation (Exercise) 93 %    Oxygen Saturation (Exit) 95 %    Rating of Perceived Exertion (Exercise) 12    Perceived Dyspnea (Exercise) 12    Duration Continue with 30 min of aerobic exercise without signs/symptoms of physical distress.    Intensity THRR unchanged      Progression   Progression Continue to progress workloads to maintain  intensity without signs/symptoms of physical distress.      Resistance Training   Training Prescription Yes    Weight 4    Reps 10-15    Time 10 Minutes      NuStep   Level 3    SPM 83    Minutes 22    METs 2      Arm Ergometer   Level 3.4    RPM 43    Minutes 17    METs 1.9             Nutrition:  Target Goals: Understanding of nutrition guidelines, daily intake of sodium '1500mg'$ , cholesterol '200mg'$ , calories 30% from fat and 7% or less from saturated fats, daily to have 5 or more servings of fruits and vegetables.  Biometrics:  Pre Biometrics - 06/03/22 1308       Pre Biometrics   Height '5\' 9"'$  (1.753 m)    Weight 122 kg    Waist Circumference 55 inches    Hip Circumference 56 inches    Waist to Hip Ratio 0.98 %    BMI (Calculated) 39.7    Triceps Skinfold 25 mm    % Body Fat 41.6 %    Grip Strength 23.3 kg    Flexibility 0 in    Single Leg Stand 0 seconds              Nutrition Therapy Plan and Nutrition Goals:  Nutrition Therapy & Goals - 06/24/22 1444       Personal Nutrition Goals   Comments Patient scored  21 on his diet assessment.  We offer 2 educational sessions on heart healthy nutrition with hanhouts and offer assistance with RD  referral if patient is interested.      Intervention Plan   Intervention Nutrition handout(s) given to patient.    Expected Outcomes Short Term Goal: Understand basic principles of dietary content, such as  calories, fat, sodium, cholesterol and nutrients.             Nutrition Assessments:  Nutrition Assessments - 06/03/22 1245       MEDFICTS Scores   Pre Score 21            MEDIFICTS Score Key: ?70 Need to make dietary changes  40-70 Heart Healthy Diet ? 40 Therapeutic Level Cholesterol Diet   Picture Your Plate Scores: <35 Unhealthy dietary pattern with much room for improvement. 41-50 Dietary pattern unlikely to meet recommendations for good health and room for improvement. 51-60 More  healthful dietary pattern, with some room for improvement.  >60 Healthy dietary pattern, although there may be some specific behaviors that could be improved.    Nutrition Goals Re-Evaluation:   Nutrition Goals Discharge (Final Nutrition Goals Re-Evaluation):   Psychosocial: Target Goals: Acknowledge presence or absence of significant depression and/or stress, maximize coping skills, provide positive support system. Participant is able to verbalize types and ability to use techniques and skills needed for reducing stress and depression.  Initial Review & Psychosocial Screening:  Initial Psych Review & Screening - 06/03/22 1305       Initial Review   Current issues with None Identified      Family Dynamics   Good Support System? Yes    Comments His daughter is his main support system.      Barriers   Psychosocial barriers to participate in program There are no identifiable barriers or psychosocial needs.      Screening Interventions   Interventions Encouraged to exercise    Expected Outcomes Long Term goal: The participant improves quality of Life and PHQ9 Scores as seen by post scores and/or verbalization of changes;Short Term goal: Identification and review with participant of any Quality of Life or Depression concerns found by scoring the questionnaire.             Quality of Life Scores:  Quality of Life - 06/03/22 1309       Quality of Life   Select Quality of Life      Quality of Life Scores   Health/Function Pre 25.72 %    Socioeconomic Pre 30 %    Psych/Spiritual Pre 30 %    Family Pre 30 %    GLOBAL Pre 27.92 %            Scores of 19 and below usually indicate a poorer quality of life in these areas.  A difference of  2-3 points is a clinically meaningful difference.  A difference of 2-3 points in the total score of the Quality of Life Index has been associated with significant improvement in overall quality of life, self-image, physical symptoms, and  general health in studies assessing change in quality of life.   PHQ-9: Review Flowsheet       06/03/2022  Depression screen PHQ 2/9  Decreased Interest 0  Down, Depressed, Hopeless 0  PHQ - 2 Score 0  Altered sleeping 0  Tired, decreased energy 2  Change in appetite 0  Feeling bad or failure about yourself  0  Trouble concentrating 0  Moving slowly or fidgety/restless 0  Suicidal thoughts 0  PHQ-9 Score 2  Difficult doing work/chores Not difficult at all   Interpretation of Total Score  Total Score Depression Severity:  1-4 = Minimal depression, 5-9 = Mild depression, 10-14 = Moderate depression, 15-19 = Moderately severe depression, 20-27 = Severe depression   Psychosocial Evaluation and Intervention:  Psychosocial Evaluation -  06/03/22 1324       Psychosocial Evaluation & Interventions   Interventions Encouraged to exercise with the program and follow exercise prescription    Comments Pt has no barriers to participate in PR. He has no identifiable psychosocial issues. 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 His health conditions cause him to take frequent breaks while doing these activities and he has to sit while doing them. He reports that he has a good support system with his daughter. Her reports that he divorced his wife three years ago and he is no longer stressed now that he is divorced. His goals while in the program are to lose weight and to decrease his SOB with his ADL's. He is eager to begin the program.    Expected Outcomes Pt will continue to have no identifiable psychosocial issues.    Continue Psychosocial Services  No Follow up required             Psychosocial Re-Evaluation:  Psychosocial Re-Evaluation     Victor Name 06/24/22 1426 07/24/22 1423 08/19/22 1503          Psychosocial Re-Evaluation   Current issues with None Identified None Identified None Identified     Comments Patient is new to the program completing 5 sessions.  His initial QOL score is 27.92% and his PHQ-9 score was 2.  He seems to enjoy coming to the program.  He also interacts  with class and staff.  Patient seems to enjoy coming to class and demonstrates a very positive outlook.  We will continue to monitor as he progresses in the program.   He will continue to have no psychosicial issues identified. Patient has completed 14 sessions.   He seems to enjoy coming to the program.  Patient enjoys socializing and interacts well with class and staff, also very talkative and friendly.   He also demonstrates a very positive outlook and attitude,  he continues to work hard in [place].  We will continue to monitor as he progresses in the program.   He will continue to have no psychosicial issues identified. Patient has completed 21 sessions.   He seems to enjoy coming to the program.  Patient enjoys socializing and interacts well with class and staff, also very talkative and friendly.   He also demonstrates a very positive outlook and attitude,  he continues to work hard in [place].  We will continue to monitor as he progresses in the program.   He will continue to have no psychosicial issues identified.     Expected Outcomes Patient will have no psychosocial barries indentified at discharge. Patient will have no psychosocial barries indentified at discharge. Patient will have no psychosocial barries indentified at discharge.     Interventions Relaxation education;Encouraged to attend Pulmonary Rehabilitation for the exercise;Stress management education Relaxation education;Encouraged to attend Pulmonary Rehabilitation for the exercise;Stress management education Relaxation education;Encouraged to attend Pulmonary Rehabilitation for the exercise;Stress management education     Continue Psychosocial Services  No Follow up  required No Follow up required No Follow up required              Psychosocial Discharge (Final Psychosocial Re-Evaluation):  Psychosocial Re-Evaluation - 08/19/22 1503       Psychosocial Re-Evaluation   Current issues with None Identified    Comments Patient  has completed 21 sessions.   He seems to enjoy coming to the program.  Patient enjoys socializing and interacts well with class and staff, also very talkative and friendly.   He also demonstrates a very positive outlook and attitude,  he continues to work hard in [place].  We will continue to monitor as he progresses in the program.   He will continue to have no psychosicial issues identified.    Expected Outcomes Patient will have no psychosocial barries indentified at discharge.    Interventions Relaxation education;Encouraged to attend Pulmonary Rehabilitation for the exercise;Stress management education    Continue Psychosocial Services  No Follow up required              Education: Education Goals: Education classes will be provided on a weekly basis, covering required topics. Participant will state understanding/return demonstration of topics presented.  Learning Barriers/Preferences:  Learning Barriers/Preferences - 06/03/22 1308       Learning Barriers/Preferences   Learning Barriers None    Learning Preferences Audio;Verbal Instruction             Education Topics: How Lungs Work and Diseases: - Discuss the anatomy of the lungs and diseases that can affect the lungs, such as COPD. Flowsheet Row PULMONARY REHAB OTHER RESPIRATORY from 08/22/2022 in [place]  Date 08/22/22  Educator DF  Instruction Review Code 2- Demonstrated Understanding       Exercise: -Discuss the importance of exercise, FITT principles of exercise, normal and abnormal responses to exercise, and how to exercise safely.   Environmental Irritants: -Discuss types of environmental irritants and how to limit  exposure to environmental irritants.   Meds/Inhalers and oxygen: - Discuss respiratory medications, definition of an inhaler and oxygen, and the proper way to use an inhaler and oxygen. Flowsheet Row PULMONARY REHAB OTHER RESPIRATORY from 08/22/2022 in [place]  Date 06/06/22  Educator HJ       Energy Saving Techniques: - Discuss methods to conserve energy and decrease shortness of breath when performing activities of daily living.  Flowsheet Row PULMONARY REHAB OTHER RESPIRATORY from 08/22/2022 in [place]  Date 06/13/22  Educator pb  Instruction Review Code 1- Verbalizes Understanding       Bronchial Hygiene / Breathing Techniques: - Discuss breathing mechanics, pursed-lip breathing technique,  proper posture, effective ways to clear airways, and other functional breathing techniques Flowsheet Row PULMONARY REHAB OTHER RESPIRATORY from 08/22/2022 in [place]  Date 06/20/22  Educator DF  Instruction Review Code 1- Verbalizes Understanding       Cleaning Equipment: - Provides group verbal and written instruction about the health risks of elevated stress, cause of high stress, and healthy ways to reduce stress. Flowsheet Row PULMONARY REHAB OTHER RESPIRATORY from 08/22/2022 in [place]  Date 06/27/22  Educator pb  Instruction Review Code 1- Verbalizes Understanding       Nutrition I: Fats: - Discuss the types of cholesterol, what cholesterol does to the body, and how cholesterol levels can be controlled. Flowsheet Row PULMONARY REHAB OTHER RESPIRATORY from 08/22/2022 in [place]  Date 07/04/22  Educator pb  Instruction Review Code 1- Verbalizes Understanding       Nutrition II: Labels: -Discuss the different components of food labels and how to read food labels. Flowsheet Row PULMONARY REHAB OTHER RESPIRATORY from 08/22/2022 in [place]  Date 07/11/22  Educator Handout  Instruction Review Code 1- Verbalizes Understanding  Respiratory Infections: - Discuss the signs and symptoms of respiratory infections, ways to prevent respiratory infections, and the importance of seeking medical treatment when having a respiratory infection. Flowsheet Row PULMONARY REHAB OTHER RESPIRATORY from 08/22/2022 in [place]  Date 07/25/22  Educator DM  Instruction Review Code 1- Verbalizes Understanding       Stress I: Signs and Symptoms: - Discuss the causes of stress, how stress may lead to anxiety and depression, and ways to limit stress. Flowsheet Row PULMONARY REHAB OTHER RESPIRATORY from 08/22/2022 in [place]  Date 08/01/22  Educator [place]  Instruction Review Code 1- Verbalizes Understanding       Stress II: Relaxation: -Discuss relaxation techniques to limit stress. Flowsheet Row PULMONARY REHAB OTHER RESPIRATORY from 08/22/2022 in [place]  Date 08/08/22  Educator pb  Instruction Review Code 1- Verbalizes Understanding       Oxygen for Home/Travel: - Discuss how to prepare for travel when on oxygen and proper ways to transport and store oxygen to ensure safety. Flowsheet Row PULMONARY REHAB OTHER RESPIRATORY from 08/22/2022 in [place]  Date 08/15/22  Educator DF  Instruction Review Code 1- Verbalizes Understanding       Knowledge Questionnaire Score:  Knowledge Questionnaire Score - 06/03/22 1245       Knowledge Questionnaire Score   Pre Score 10/18             Core Components/Risk Factors/Patient Goals at Admission:  Personal Goals and Risk Factors at Admission - 06/03/22 1311       Core Components/Risk Factors/Patient Goals on Admission    Weight Management Yes;Obesity;Weight Loss    Intervention Obesity: Provide education and appropriate resources to help participant work on and  attain dietary goals.;Weight Management/Obesity: Establish reasonable short term and long term weight goals.;Weight Management: Provide education and appropriate resources to help participant work on and attain dietary goals.;Weight Management: Develop a combined nutrition and exercise program designed to reach desired caloric intake, while maintaining appropriate intake of nutrient and fiber, sodium and fats, and appropriate energy expenditure required for the weight goal.    Expected Outcomes Short Term: Continue to assess and modify interventions until short term weight is achieved;Long Term: Adherence to nutrition and physical activity/exercise program aimed toward attainment of established weight goal;Weight Maintenance: Understanding of the daily nutrition guidelines, which includes 25-35% calories from fat, 7% or less cal from saturated fats, less than '200mg'$  cholesterol, less than 1.5gm of sodium, & 5 or more servings of fruits and vegetables daily;Understanding recommendations for meals to include 15-35% energy as protein, 25-35% energy from fat, 35-60% energy from carbohydrates, less than '200mg'$  of dietary cholesterol, 20-35 gm of total fiber daily;Understanding of distribution of calorie intake throughout the day with the consumption of 4-5 meals/snacks;Weight Gain: Understanding of general recommendations for a high calorie, high protein meal plan that promotes weight gain by distributing calorie intake throughout the day with the consumption for 4-5 meals, snacks, and/or supplements    Improve shortness of breath with ADL's Yes    Intervention Provide education, individualized exercise plan and daily activity instruction to help decrease symptoms of SOB with activities of daily living.    Expected Outcomes Short Term: Improve cardiorespiratory fitness to achieve a reduction of symptoms when performing ADLs;Long Term: Be able to perform more ADLs without symptoms or delay the onset of symptoms     Diabetes Yes    Intervention Provide education about signs/symptoms and action to take for  hypo/hyperglycemia.;Provide education about proper nutrition, including hydration, and aerobic/resistive exercise prescription along with prescribed medications to achieve blood glucose in normal ranges: Fasting glucose 65-99 mg/dL    Expected Outcomes Short Term: Participant verbalizes understanding of the signs/symptoms and immediate care of hyper/hypoglycemia, proper foot care and importance of medication, aerobic/resistive exercise and nutrition plan for blood glucose control.;Long Term: Attainment of HbA1C < 7%.    Heart Failure Yes    Intervention Provide a combined exercise and nutrition program that is supplemented with education, support and counseling about heart failure. Directed toward relieving symptoms such as shortness of breath, decreased exercise tolerance, and extremity edema.    Expected Outcomes Improve functional capacity of life;Short term: Attendance in program 2-3 days a week with increased exercise capacity. Reported lower sodium intake. Reported increased fruit and vegetable intake. Reports medication compliance.;Short term: Daily weights obtained and reported for increase. Utilizing diuretic protocols set by physician.;Long term: Adoption of self-care skills and reduction of barriers for early signs and symptoms recognition and intervention leading to self-care maintenance.             Core Components/Risk Factors/Patient Goals Review:   Goals and Risk Factor Review     Row Name 06/24/22 1433 07/24/22 1432 08/19/22 1504         Core Components/Risk Factors/Patient Goals Review   Personal Goals Review Weight Management/Obesity;Improve shortness of breath with ADL's;Develop more efficient breathing techniques such as purse lipped breathing and diaphragmatic breathing and practicing self-pacing with activity. Weight Management/Obesity;Improve shortness of breath with ADL's;Develop  more efficient breathing techniques such as purse lipped breathing and diaphragmatic breathing and practicing self-pacing with activity. Weight Management/Obesity;Improve shortness of breath with ADL's;Develop more efficient breathing techniques such as purse lipped breathing and diaphragmatic breathing and practicing self-pacing with activity.     Review Patient is new to the program completing 5 sessions.  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  His personal goals for the program are to lose weight and decrease his SOB nwith daily ADL's . Weight are averaging around 122.2kg.   We will continue to monitor his progress as he works toward meeting these goals. Patient has completed 14 sessions.   His personal goals for the program are to lose weight and decrease his SOB with daily ADL's . Weight are averaging around 123.9kg.  He tolerated exericise well with O2sats averaging around 93%-95% without supplemental oxygen, only room air.  He tolerates using the Arm ergometer and the Nu Step.  We encourage purse lipped breathing technique .   We will continue to monitor as he works hard toward meeting thest goals. Patient has completed 21 sessions.   His personal goals for the program are to lose weight and decrease his SOB with daily ADL's . Weight are averaging up and down around 125.1kg.  He tolerated exericise well with O2sats averaging around 92%-95% without supplemental oxygen, only room air.  He tolerates using the Arm ergometer and the Nu Step.  We encourage purse lipped breathing technique .   We will continue to monitor as he works hard toward meeting thest goals.     Expected Outcomes [facility] will complete the program meeting both program and personal goals at discharge. [facility] will complete the program meeting both program and personal goals at discharge. [facility] will complete the program meeting both program and personal goals at discharge.              Core  Components/Risk Factors/Patient Goals at Discharge (  Final Review):   Goals and Risk Factor Review - 08/19/22 1504       Core Components/Risk Factors/Patient Goals Review   Personal Goals Review Weight Management/Obesity;Improve shortness of breath with ADL's;Develop more efficient breathing techniques such as purse lipped breathing and diaphragmatic breathing and practicing self-pacing with activity.    Review Patient has completed 21 sessions.   His personal goals for the program are to lose weight and decrease his SOB with daily ADL's . Weight are averaging up and down around 125.1kg.  He tolerated exericise well with O2sats averaging around 92%-95% without supplemental oxygen, only room air.  He tolerates using the Arm ergometer and the Nu Step.  We encourage purse lipped breathing technique .   We will continue to monitor as he works hard toward meeting thest goals.    Expected Outcomes [facility] will complete the program meeting both program and personal goals at discharge.             ITP Comments:   Comments: ITP REVIEW Pt is making expected progress toward pulmonary rehab goals after completing 25 sessions. Recommend continued exercise, life style modification, education, and utilization of breathing techniques to increase stamina and strength and decrease shortness of breath with exertion.

## 2022-08-29 ENCOUNTER — Encounter (HOSPITAL_COMMUNITY)
Admission: RE | Admit: 2022-08-29 | Discharge: 2022-08-29 | Disposition: A | Discharge status: Home / Self Care | Payer: Medicare HMO | Source: Ambulatory Visit | Attending: Student | Admitting: Student

## 2022-08-29 DIAGNOSIS — J432 Centrilobular emphysema: Secondary | ICD-10-CM

## 2022-08-29 NOTE — Progress Notes (Signed)
Daily Session Note  Patient Details  Name: Vincent Cooper MRN: [id number] Date of Birth: 12/05/1940 Referring Provider:   Flowsheet Row PULMONARY REHAB OTHER RESP ORIENTATION from 06/03/2022 in [facility]  Referring Provider Dr. Meier       Encounter Date: 08/29/2022  Check In:  Session Check In - 08/29/22 1330       Check-In   Supervising physician immediately available to respond to emergencies [facility] MD immediately available    Physician(s) Dr Schumann    Location [facility]    Staff Present Heather Jachimiak, BS, Exercise Physiologist;Daphyne Martin, RN, BSN    Virtual Visit No    Medication changes reported     No    Fall or balance concerns reported    Yes    Comments Has not fallen, but he uses a walking cane for support. He admits to losing his balance but not falling.    Tobacco Cessation No Change    Warm-up and Cool-down Performed as group-led instruction    Resistance Training Performed Yes    VAD Patient? No    PAD/SET Patient? No      Pain Assessment   Currently in Pain? No/denies    Pain Score 0-No pain    Multiple Pain Sites No             Capillary Blood Glucose: No results found for this or any previous visit (from the past 24 hour(s)).    Social History   Tobacco Use  Smoking Status Former   Packs/day: 2.00   Years: 55.00   Total pack years: 110.00   Types: Cigarettes   Quit date: 11/18/2001   Years since quitting: 20.7  Smokeless Tobacco Former    Goals Met:  Proper associated with RPD/PD & O2 Sat Independence with exercise equipment Using PLB without cueing & demonstrates good technique Exercise tolerated well Queuing for purse lip breathing No report of concerns or symptoms today Strength training completed today  Goals Unmet:  Not Applicable  Comments: Checkout at 1430.   Dr. Jehanzeb Memon is Medical Director for [facility]. 

## 2022-09-02 ENCOUNTER — Encounter (HOSPITAL_COMMUNITY)
Admission: RE | Disposition: A | Discharge status: Home / Self Care | Payer: Self-pay | Source: Home / Self Care | Attending: General Surgery

## 2022-09-02 ENCOUNTER — Ambulatory Visit (HOSPITAL_BASED_OUTPATIENT_CLINIC_OR_DEPARTMENT_OTHER): Payer: Medicare HMO | Admitting: Anesthesiology

## 2022-09-02 ENCOUNTER — Observation Stay (HOSPITAL_COMMUNITY)
Admission: RE | Admit: 2022-09-02 | Discharge: 2022-09-03 | Disposition: A | Discharge status: Home / Self Care | Payer: Medicare HMO | Attending: General Surgery | Admitting: General Surgery

## 2022-09-02 ENCOUNTER — Ambulatory Visit (HOSPITAL_COMMUNITY): Payer: Medicare HMO | Admitting: Anesthesiology

## 2022-09-02 ENCOUNTER — Other Ambulatory Visit: Payer: Self-pay

## 2022-09-02 ENCOUNTER — Encounter (HOSPITAL_COMMUNITY): Payer: Self-pay | Admitting: General Surgery

## 2022-09-02 DIAGNOSIS — Z87891 Personal history of nicotine dependence: Secondary | ICD-10-CM | POA: Diagnosis not present

## 2022-09-02 DIAGNOSIS — I10 Essential (primary) hypertension: Secondary | ICD-10-CM

## 2022-09-02 DIAGNOSIS — I2510 Atherosclerotic heart disease of native coronary artery without angina pectoris: Secondary | ICD-10-CM | POA: Diagnosis not present

## 2022-09-02 DIAGNOSIS — E119 Type 2 diabetes mellitus without complications: Secondary | ICD-10-CM | POA: Insufficient documentation

## 2022-09-02 DIAGNOSIS — K439 Ventral hernia without obstruction or gangrene: Secondary | ICD-10-CM

## 2022-09-02 DIAGNOSIS — J449 Chronic obstructive pulmonary disease, unspecified: Secondary | ICD-10-CM | POA: Insufficient documentation

## 2022-09-02 DIAGNOSIS — Z86711 Personal history of pulmonary embolism: Secondary | ICD-10-CM | POA: Insufficient documentation

## 2022-09-02 DIAGNOSIS — K432 Incisional hernia without obstruction or gangrene: Secondary | ICD-10-CM | POA: Diagnosis not present

## 2022-09-02 HISTORY — PX: VENTRAL HERNIA REPAIR: SHX424

## 2022-09-02 LAB — GLUCOSE, CAPILLARY
Glucose-Capillary: 147 mg/dL — ABNORMAL HIGH (ref 70–99)
Glucose-Capillary: 163 mg/dL — ABNORMAL HIGH (ref 70–99)

## 2022-09-02 SURGERY — REPAIR, HERNIA, VENTRAL
Anesthesia: General | Site: Abdomen

## 2022-09-02 MED ORDER — LIDOCAINE HCL (CARDIAC) PF 100 MG/5ML IV SOSY
PREFILLED_SYRINGE | INTRAVENOUS | Status: DC | PRN
  Administered 2022-09-02: 100 mg via INTRAVENOUS

## 2022-09-02 MED ORDER — CHLORHEXIDINE GLUCONATE 0.12 % MT SOLN
15.0000 mL | Freq: Once | OROMUCOSAL | Status: AC
  Administered 2022-09-02: 15 mL via OROMUCOSAL

## 2022-09-02 MED ORDER — OXYCODONE HCL 5 MG PO TABS: 5.0000 mg | ORAL_TABLET | Freq: Once | ORAL | Status: DC | PRN

## 2022-09-02 MED ORDER — ROCURONIUM BROMIDE 100 MG/10ML IV SOLN
INTRAVENOUS | Status: DC | PRN
  Administered 2022-09-02: 10 mg via INTRAVENOUS
  Administered 2022-09-02: 50 mg via INTRAVENOUS

## 2022-09-02 MED ORDER — ROSUVASTATIN CALCIUM 20 MG PO TABS
40.0000 mg | ORAL_TABLET | Freq: Every day | ORAL | Status: DC
  Administered 2022-09-02: 40 mg via ORAL
  Filled 2022-09-02: qty 2

## 2022-09-02 MED ORDER — DIPHENHYDRAMINE HCL 50 MG/ML IJ SOLN
12.5000 mg | Freq: Four times a day (QID) | INTRAMUSCULAR | Status: DC | PRN
  Administered 2022-09-03: 12.5 mg via INTRAVENOUS
  Filled 2022-09-02 (×2): qty 1

## 2022-09-02 MED ORDER — POLYVINYL ALCOHOL 1.4 % OP SOLN: 1.0000 [drp] | OPHTHALMIC | Status: DC | PRN

## 2022-09-02 MED ORDER — METOPROLOL TARTRATE 5 MG/5ML IV SOLN: 5.0000 mg | Freq: Four times a day (QID) | INTRAVENOUS | Status: DC | PRN

## 2022-09-02 MED ORDER — CHLORHEXIDINE GLUCONATE CLOTH 2 % EX PADS: 6.0000 | MEDICATED_PAD | Freq: Once | CUTANEOUS | Status: DC

## 2022-09-02 MED ORDER — DILTIAZEM HCL ER COATED BEADS 180 MG PO CP24
360.0000 mg | ORAL_CAPSULE | Freq: Every day | ORAL | Status: DC
  Administered 2022-09-03: 360 mg via ORAL
  Filled 2022-09-02: qty 2

## 2022-09-02 MED ORDER — PROPOFOL 10 MG/ML IV BOLUS
INTRAVENOUS | Status: DC | PRN
  Administered 2022-09-02: 100 mg via INTRAVENOUS

## 2022-09-02 MED ORDER — PHENYLEPHRINE 80 MCG/ML (10ML) SYRINGE FOR IV PUSH (FOR BLOOD PRESSURE SUPPORT)
PREFILLED_SYRINGE | INTRAVENOUS | Status: AC
  Filled 2022-09-02: qty 10

## 2022-09-02 MED ORDER — MORPHINE SULFATE (PF) 2 MG/ML IV SOLN: 2.0000 mg | INTRAVENOUS | Status: DC | PRN

## 2022-09-02 MED ORDER — BUPIVACAINE LIPOSOME 1.3 % IJ SUSP
INTRAMUSCULAR | Status: DC | PRN
  Administered 2022-09-02: 20 mL

## 2022-09-02 MED ORDER — OXYCODONE HCL 5 MG/5ML PO SOLN: 5.0000 mg | Freq: Once | ORAL | Status: DC | PRN

## 2022-09-02 MED ORDER — ONDANSETRON HCL 4 MG/2ML IJ SOLN
INTRAMUSCULAR | Status: AC
  Filled 2022-09-02: qty 2

## 2022-09-02 MED ORDER — OXYCODONE HCL 5 MG PO TABS: 5.0000 mg | ORAL_TABLET | ORAL | Status: DC | PRN

## 2022-09-02 MED ORDER — PROPOFOL 10 MG/ML IV BOLUS
INTRAVENOUS | Status: AC
  Filled 2022-09-02: qty 20

## 2022-09-02 MED ORDER — ONDANSETRON 4 MG PO TBDP: 4.0000 mg | ORAL_TABLET | Freq: Four times a day (QID) | ORAL | Status: DC | PRN

## 2022-09-02 MED ORDER — 0.9 % SODIUM CHLORIDE (POUR BTL) OPTIME
TOPICAL | Status: DC | PRN
  Administered 2022-09-02: 1000 mL

## 2022-09-02 MED ORDER — METHOCARBAMOL 500 MG PO TABS
500.0000 mg | ORAL_TABLET | Freq: Three times a day (TID) | ORAL | Status: DC | PRN
  Administered 2022-09-02: 500 mg via ORAL
  Filled 2022-09-02: qty 1

## 2022-09-02 MED ORDER — ORAL CARE MOUTH RINSE: 15.0000 mL | Freq: Once | OROMUCOSAL | Status: AC

## 2022-09-02 MED ORDER — BUPIVACAINE LIPOSOME 1.3 % IJ SUSP
INTRAMUSCULAR | Status: AC
  Filled 2022-09-02: qty 20

## 2022-09-02 MED ORDER — TORSEMIDE 20 MG PO TABS
20.0000 mg | ORAL_TABLET | Freq: Every day | ORAL | Status: DC
  Administered 2022-09-02: 20 mg via ORAL
  Filled 2022-09-02: qty 1

## 2022-09-02 MED ORDER — LACTATED RINGERS IV SOLN
INTRAVENOUS | Status: DC
  Administered 2022-09-02: 1000 mL via INTRAVENOUS

## 2022-09-02 MED ORDER — DIPHENHYDRAMINE HCL 50 MG/ML IJ SOLN
INTRAMUSCULAR | Status: AC
  Filled 2022-09-02: qty 1

## 2022-09-02 MED ORDER — MELATONIN 3 MG PO TABS: 3.0000 mg | ORAL_TABLET | Freq: Every evening | ORAL | Status: DC | PRN

## 2022-09-02 MED ORDER — BUDESON-GLYCOPYRROL-FORMOTEROL 160-9-4.8 MCG/ACT IN AERO: 2.0000 | INHALATION_SPRAY | Freq: Two times a day (BID) | RESPIRATORY_TRACT | Status: DC

## 2022-09-02 MED ORDER — TORSEMIDE 20 MG PO TABS
40.0000 mg | ORAL_TABLET | Freq: Every day | ORAL | Status: DC
  Administered 2022-09-03: 40 mg via ORAL
  Filled 2022-09-02: qty 2

## 2022-09-02 MED ORDER — POTASSIUM CHLORIDE CRYS ER 20 MEQ PO TBCR
20.0000 meq | EXTENDED_RELEASE_TABLET | Freq: Every day | ORAL | Status: DC
  Administered 2022-09-03: 20 meq via ORAL
  Filled 2022-09-02: qty 1

## 2022-09-02 MED ORDER — FENTANYL CITRATE (PF) 100 MCG/2ML IJ SOLN
INTRAMUSCULAR | Status: AC
  Filled 2022-09-02: qty 2

## 2022-09-02 MED ORDER — DEXAMETHASONE SODIUM PHOSPHATE 10 MG/ML IJ SOLN
INTRAMUSCULAR | Status: DC | PRN
  Administered 2022-09-02: 4 mg via INTRAVENOUS

## 2022-09-02 MED ORDER — UMECLIDINIUM BROMIDE 62.5 MCG/ACT IN AEPB
1.0000 | INHALATION_SPRAY | Freq: Every day | RESPIRATORY_TRACT | Status: DC
  Administered 2022-09-03: 1 via RESPIRATORY_TRACT
  Filled 2022-09-02: qty 7

## 2022-09-02 MED ORDER — SIMETHICONE 80 MG PO CHEW: 40.0000 mg | CHEWABLE_TABLET | Freq: Four times a day (QID) | ORAL | Status: DC | PRN

## 2022-09-02 MED ORDER — POVIDONE-IODINE 10 % EX OINT
TOPICAL_OINTMENT | CUTANEOUS | Status: AC
  Filled 2022-09-02: qty 1

## 2022-09-02 MED ORDER — IPRATROPIUM-ALBUTEROL 0.5-2.5 (3) MG/3ML IN SOLN: 3.0000 mL | Freq: Four times a day (QID) | RESPIRATORY_TRACT | Status: DC | PRN

## 2022-09-02 MED ORDER — ONDANSETRON HCL 4 MG/2ML IJ SOLN: 4.0000 mg | Freq: Once | INTRAMUSCULAR | Status: DC | PRN

## 2022-09-02 MED ORDER — FENTANYL CITRATE (PF) 100 MCG/2ML IJ SOLN
INTRAMUSCULAR | Status: DC | PRN
  Administered 2022-09-02 (×2): 25 ug via INTRAVENOUS
  Administered 2022-09-02: 100 ug via INTRAVENOUS
  Administered 2022-09-02: 50 ug via INTRAVENOUS

## 2022-09-02 MED ORDER — METOPROLOL TARTRATE 25 MG PO TABS
25.0000 mg | ORAL_TABLET | Freq: Two times a day (BID) | ORAL | Status: DC
  Administered 2022-09-02 – 2022-09-03 (×2): 25 mg via ORAL
  Filled 2022-09-02 (×2): qty 1

## 2022-09-02 MED ORDER — ONDANSETRON HCL 4 MG/2ML IJ SOLN: 4.0000 mg | Freq: Four times a day (QID) | INTRAMUSCULAR | Status: DC | PRN

## 2022-09-02 MED ORDER — PANTOPRAZOLE SODIUM 40 MG IV SOLR
40.0000 mg | Freq: Every day | INTRAVENOUS | Status: DC
  Administered 2022-09-02: 40 mg via INTRAVENOUS
  Filled 2022-09-02 (×2): qty 10

## 2022-09-02 MED ORDER — CEFAZOLIN IN SODIUM CHLORIDE 3-0.9 GM/100ML-% IV SOLN
3.0000 g | INTRAVENOUS | Status: AC
  Administered 2022-09-02: 3 g via INTRAVENOUS
  Filled 2022-09-02: qty 100

## 2022-09-02 MED ORDER — IPRATROPIUM-ALBUTEROL 0.5-2.5 (3) MG/3ML IN SOLN
RESPIRATORY_TRACT | Status: AC
  Filled 2022-09-02: qty 3

## 2022-09-02 MED ORDER — LIDOCAINE HCL (PF) 2 % IJ SOLN
INTRAMUSCULAR | Status: AC
  Filled 2022-09-02: qty 5

## 2022-09-02 MED ORDER — PHENYLEPHRINE HCL (PRESSORS) 10 MG/ML IV SOLN
INTRAVENOUS | Status: DC | PRN
  Administered 2022-09-02 (×5): 160 ug via INTRAVENOUS

## 2022-09-02 MED ORDER — DIPHENHYDRAMINE HCL 12.5 MG/5ML PO ELIX
12.5000 mg | ORAL_SOLUTION | Freq: Four times a day (QID) | ORAL | Status: DC | PRN
  Administered 2022-09-02 (×2): 12.5 mg via ORAL
  Filled 2022-09-02: qty 5

## 2022-09-02 MED ORDER — POVIDONE-IODINE 10 % OINT PACKET: TOPICAL_OINTMENT | CUTANEOUS | Status: DC | PRN

## 2022-09-02 MED ORDER — IPRATROPIUM-ALBUTEROL 0.5-2.5 (3) MG/3ML IN SOLN
3.0000 mL | Freq: Once | RESPIRATORY_TRACT | Status: AC
  Administered 2022-09-02: 3 mL via RESPIRATORY_TRACT

## 2022-09-02 MED ORDER — DIPHENHYDRAMINE HCL 50 MG/ML IJ SOLN
25.0000 mg | Freq: Once | INTRAMUSCULAR | Status: AC
  Administered 2022-09-02: 25 mg via INTRAVENOUS
  Filled 2022-09-02: qty 1

## 2022-09-02 MED ORDER — ACETAMINOPHEN 500 MG PO TABS
1000.0000 mg | ORAL_TABLET | Freq: Four times a day (QID) | ORAL | Status: DC
  Administered 2022-09-02 – 2022-09-03 (×4): 1000 mg via ORAL
  Filled 2022-09-02 (×4): qty 2

## 2022-09-02 MED ORDER — FLUTICASONE FUROATE-VILANTEROL 200-25 MCG/ACT IN AEPB
1.0000 | INHALATION_SPRAY | Freq: Every day | RESPIRATORY_TRACT | Status: DC
  Administered 2022-09-03: 1 via RESPIRATORY_TRACT
  Filled 2022-09-02: qty 28

## 2022-09-02 MED ORDER — ALBUTEROL SULFATE HFA 108 (90 BASE) MCG/ACT IN AERS: 1.0000 | INHALATION_SPRAY | Freq: Four times a day (QID) | RESPIRATORY_TRACT | Status: DC | PRN

## 2022-09-02 MED ORDER — DOCUSATE SODIUM 100 MG PO CAPS
100.0000 mg | ORAL_CAPSULE | Freq: Two times a day (BID) | ORAL | Status: DC
  Administered 2022-09-03: 100 mg via ORAL
  Filled 2022-09-02 (×2): qty 1

## 2022-09-02 MED ORDER — ONDANSETRON HCL 4 MG/2ML IJ SOLN
INTRAMUSCULAR | Status: DC | PRN
  Administered 2022-09-02: 4 mg via INTRAVENOUS

## 2022-09-02 MED ORDER — NITROGLYCERIN 0.4 MG SL SUBL: 0.4000 mg | SUBLINGUAL_TABLET | SUBLINGUAL | Status: DC | PRN

## 2022-09-02 MED ORDER — TAMSULOSIN HCL 0.4 MG PO CAPS
0.4000 mg | ORAL_CAPSULE | Freq: Every day | ORAL | Status: DC
  Administered 2022-09-02: 0.4 mg via ORAL
  Filled 2022-09-02: qty 1

## 2022-09-02 MED ORDER — DEXAMETHASONE SODIUM PHOSPHATE 10 MG/ML IJ SOLN
INTRAMUSCULAR | Status: AC
  Filled 2022-09-02: qty 1

## 2022-09-02 MED ORDER — SUGAMMADEX SODIUM 500 MG/5ML IV SOLN
INTRAVENOUS | Status: DC | PRN
  Administered 2022-09-02: 500 mg via INTRAVENOUS

## 2022-09-02 MED ORDER — METHOCARBAMOL 1000 MG/10ML IJ SOLN: 500.0000 mg | Freq: Three times a day (TID) | INTRAVENOUS | Status: DC | PRN

## 2022-09-02 MED ORDER — FENTANYL CITRATE PF 50 MCG/ML IJ SOSY: 25.0000 ug | PREFILLED_SYRINGE | INTRAMUSCULAR | Status: DC | PRN

## 2022-09-02 MED ORDER — ROCURONIUM BROMIDE 10 MG/ML (PF) SYRINGE
PREFILLED_SYRINGE | INTRAVENOUS | Status: AC
  Filled 2022-09-02: qty 10

## 2022-09-02 SURGICAL SUPPLY — 42 items
BINDER ABDOMINAL 12 XL 75-84 (SOFTGOODS) IMPLANT
CHLORAPREP W/TINT 26 (MISCELLANEOUS) ×1 IMPLANT
CLOTH BEACON ORANGE TIMEOUT ST (SAFETY) ×1 IMPLANT
COVER LIGHT HANDLE STERIS (MISCELLANEOUS) ×2 IMPLANT
DRSG OPSITE POSTOP 4X10 (GAUZE/BANDAGES/DRESSINGS) IMPLANT
DRSG OPSITE POSTOP 4X8 (GAUZE/BANDAGES/DRESSINGS) IMPLANT
ELECT REM PT RETURN 9FT ADLT (ELECTROSURGICAL) ×1
ELECTRODE REM PT RTRN 9FT ADLT (ELECTROSURGICAL) ×1 IMPLANT
GAUZE 4X4 16PLY ~~LOC~~+RFID DBL (SPONGE) IMPLANT
GAUZE SPONGE 4X4 12PLY STRL (GAUZE/BANDAGES/DRESSINGS) ×1 IMPLANT
GLOVE BIO SURGEON STRL SZ 6.5 (GLOVE) ×1 IMPLANT
GLOVE BIOGEL PI IND STRL 6.5 (GLOVE) ×1 IMPLANT
GLOVE BIOGEL PI IND STRL 7.0 (GLOVE) ×2 IMPLANT
GLOVE ECLIPSE 6.5 STRL STRAW (GLOVE) IMPLANT
GLOVE INDICATOR 7.0 STRL GRN (GLOVE) IMPLANT
GOWN STRL REUS W/TWL LRG LVL3 (GOWN DISPOSABLE) ×2 IMPLANT
INST SET MAJOR GENERAL (KITS) ×1 IMPLANT
KIT TURNOVER KIT A (KITS) ×1 IMPLANT
MANIFOLD NEPTUNE II (INSTRUMENTS) ×1 IMPLANT
MESH VENTRALEX ST 2.5 CRC MED (Mesh General) ×1 IMPLANT
MESH VENTRALEX ST 8CM LRG (Mesh General) IMPLANT
NDL HYPO 21X1.5 SAFETY (NEEDLE) ×1 IMPLANT
NEEDLE HYPO 21X1.5 SAFETY (NEEDLE) ×1 IMPLANT
NS IRRIG 1000ML POUR BTL (IV SOLUTION) ×1 IMPLANT
PACK MAJOR ABDOMINAL (CUSTOM PROCEDURE TRAY) ×1 IMPLANT
PAD ARMBOARD 7.5X6 YLW CONV (MISCELLANEOUS) ×1 IMPLANT
SET BASIN LINEN APH (SET/KITS/TRAYS/PACK) ×1 IMPLANT
SPONGE T-LAP 18X18 ~~LOC~~+RFID (SPONGE) IMPLANT
STAPLER VISISTAT (STAPLE) IMPLANT
SUT ETHIBOND NAB MO 7 #0 18IN (SUTURE) IMPLANT
SUT MNCRL AB 4-0 PS2 18 (SUTURE) IMPLANT
SUT NOVA NAB GS-21 1 T12 (SUTURE) IMPLANT
SUT NOVA NAB GS-22 2 2-0 T-19 (SUTURE) IMPLANT
SUT NOVA NAB GS-26 0 60 (SUTURE) IMPLANT
SUT SILK 2 0 (SUTURE)
SUT SILK 2-0 18XBRD TIE 12 (SUTURE) IMPLANT
SUT VIC AB 2-0 CT2 27 (SUTURE) IMPLANT
SUT VIC AB 3-0 SH 27 (SUTURE) ×2
SUT VIC AB 3-0 SH 27X BRD (SUTURE) IMPLANT
SUT VICRYL AB 2 0 TIES (SUTURE) ×1 IMPLANT
SYR 20ML LL LF (SYRINGE) ×2 IMPLANT
YANKAUER SUCT BULB TIP NO VENT (SUCTIONS) IMPLANT

## 2022-09-02 NOTE — Op Note (Signed)
[facility] Operative Note  09/02/22  Preoperative Diagnosis: Ventral hernia (multiple defects)    Postoperative Diagnosis: Same   Procedure(s) Performed: Open repair of ventral hernia X 3 (total measurement 8 cm); Ventralex ST Patch 8cm, Ventralex ST Patch 6.4cm X 2   Surgeon: Lanell Matar. 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 We discussed repair and risk of bleeding, infection, recurrence, injury to bowel, use of mesh. He also had a third defect at his superior midline we discussed repairing too. 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   Findings: 4cm defect lateral right abdomen, 2cm defect right of midline at the prior colostomy incision; 2 cm defect superior midline    Procedure: The patient was taken to the operating room and placed supine. General endotracheal anesthesia was induced. Intravenous antibiotics were administered per protocol.  An orogastric tube positioned to decompress the stomach. The abdomen was prepared and draped in the usual sterile fashion.   I had previously marked the hernias where the patient had bulges but also corresponding to the CT scan from the ED. A lateral incision was made at the prior colostomy site over the bulge. This was carried down to the fascia and the hernia sac was identified and opened. Colon was noted in the hernia and this was protected. Using blunt and sharp dissection I was able to get the colon down and the peritoneum cleared under the  defect. The defect was about 4cm. I then connected this incision medial to the other defect involved in the medial part of the prior colostomy site. I got down to this hernia sac and opened it carefully. Small bowel was noted inside and with sharp and blunt dissection I freed up the small bowel and cleared the peritoneum. The defect was 2cm. There was about 5cm bridge between the two hernias.  I then went to the midline at the upper most part of the prior incision and made a vertical incision. I carried it down to the hernia sca and opened it with care. Omentum was noted to be adherent and this was dissected off with cautery. I cleared the peritoneum around the defect.  The defect was 2cm.   From here I used a Ventralex ST patch 8cm on the most lateral largest defect, and Ventralex ST patch 6.4cm on the smaller defects X 2. I tacked the meshes to the peritoneum with 0 Ethibond suture and then proceed to get the fascia covered over the defects without tension.  This was all down with care to ensure no bowel was injured.   I then irrigated and made the wound hemostatic. I closed the adipose space with 3-0 Vicryl interrupted sutures to prevent seroma formation. I closed the skin incisions with staples. Honeycomb dressings were placed.   Final inspection revealed acceptable hemostasis. All counts were correct at the end of the case. The patient was awakened from anesthesia and extubated without complication.  The patient went to  the PACU in stable condition.   A binder was placed in PACU.   [name], MD [facility] [address] [place], [place] [address] [phone] (office)

## 2022-09-02 NOTE — Anesthesia Procedure Notes (Signed)
Procedure Name: Intubation Date/Time: 09/02/2022 11:03 AM  Performed by: Jonna Munro, CRNAPre-anesthesia Checklist: Patient identified, Emergency Drugs available, Suction available, Patient being monitored and Timeout performed Patient Re-evaluated:Patient Re-evaluated prior to induction Oxygen Delivery Method: Circle system utilized Preoxygenation: Pre-oxygenation with 100% oxygen Induction Type: IV induction Laryngoscope Size: Mac and 3 Grade View: Grade I Tube type: Oral Number of attempts: 1 Airway Equipment and Method: Stylet Placement Confirmation: ETT inserted through vocal cords under direct vision, positive ETCO2, CO2 detector and breath sounds checked- equal and bilateral Secured at: 23 cm Tube secured with: Tape Dental Injury: Teeth and Oropharynx as per pre-operative assessment

## 2022-09-02 NOTE — Progress Notes (Addendum)
[facility]  Updated daughter. Plan for staying overnight due to COPD and health issues. RT says they have machines he can use tonight.  PRN for pain.  [name], MD [facility] [address] [place], [place] [address] [phone] (office)

## 2022-09-02 NOTE — Discharge Instructions (Signed)
Discharge Open Abdominal Surgery Instructions:  Eliquis restart on 09/05/2022. Remove your bandages on 09/05/2022.  You can shower with bandages or after you remove the bandages.   Common Complaints: Pain at the incision site is common. This will improve with time. Take your pain medications as described below. Some nausea is common and poor appetite. The main goal is to stay hydrated the first few days after surgery.   Diet/ Activity: Diet as tolerated. You have started and tolerated a diet in the hospital, and should continue to increase what you are able to eat.   You may not have a large appetite, but it is important to stay hydrated. Drink 64 ounces of water a day. Your appetite will return with time.  Keep a dry dressing in place over your staples daily or as needed. Some minor pink/ blood tinged drainage is expected. This will stop in a few days after surgery.  Shower per your regular routine daily.  Do not take hot showers. Take warm showers that are less than 10 minutes. Pat the incision dry. Wear an abdominal binder daily with activity. You do not have to wear this while sleeping or sitting.  Rest and listen to your body, but do not remain in bed all day.  Walk everyday for at least 15-20 minutes. Deep cough and move around every 1-2 hours in the first few days after surgery.  Do not lift > 10 lbs, perform excessive bending, pushing, pulling, squatting for 6-8 weeks after surgery.  The activity restrictions and the abdominal binder are to prevent hernia formation at your incision while you are healing.  Do not place lotions or balms on your incision unless instructed to specifically by Dr. Henreitta Leber.   Pain Expectations and Narcotics: -After surgery you will have pain associated with your incisions and this is normal. The pain is muscular and nerve pain, and will get better with time. -You are encouraged and expected to take non narcotic medications like tylenol and ibuprofen (when  able) to treat pain as multiple modalities can aid with pain treatment. -Narcotics are only used when pain is severe or there is breakthrough pain. -You are not expected to have a pain score of 0 after surgery, as we cannot prevent pain. A pain score of 3-4 that allows you to be functional, move, walk, and tolerate some activity is the goal. The pain will continue to improve over the days after surgery and is dependent on your surgery. -Due to  law, we are only able to give a certain amount of pain medication to treat post operative pain, and we only give additional narcotics on a patient by patient basis.  -For most laparoscopic surgery, studies have shown that the majority of patients only need 10-15 narcotic pills, and for open surgeries most patients only need 15-20.   -Having appropriate expectations of pain and knowledge of pain management with non narcotics is important as we do not want anyone to become addicted to narcotic pain medication.  -Using ice packs in the first 48 hours and heating pads after 48 hours, wearing an abdominal binder (when recommended), and using over the counter medications are all ways to help with pain management.   -Simple acts like meditation and mindfulness practices after surgery can also help with pain control and research has proven the benefit of these practices.  Medication: Take tylenol and ibuprofen as needed for pain control, alternating every 4-6 hours.  Example:  Tylenol 1000mg  @ 6am, 12noon, 6pm, (  Do not exceed 4000mg  of tylenol a day). Ibuprofen 800mg  @ 9am, 3pm, 9pm, 3am (Do not exceed 3600mg  of ibuprofen a day).  Take Roxicodone for breakthrough pain every 4 hours.  Take Colace for constipation related to narcotic pain medication. If you do not have a bowel movement in 2 days, take Miralax over the counter.  Drink plenty of water to also prevent constipation.   Contact Information: If you have questions or concerns, please call our  office, [phone], Monday- Thursday 8AM-5PM and Friday 8AM-12Noon.  If it is after hours or on the weekend, please call [facility] Main Number, [phone], and ask to speak to the surgeon on call for Dr. Henreitta Leber at [facility].

## 2022-09-02 NOTE — Transfer of Care (Signed)
Immediate Anesthesia Transfer of Care Note  Patient: Vincent Cooper  Procedure(s) Performed: HERNIA REPAIR VENTRAL ADULT WITH  MESH (Abdomen)  Patient Location: PACU  Anesthesia Type:General  Level of Consciousness: awake, alert , oriented and patient cooperative  Airway & Oxygen Therapy: Patient Spontanous Breathing and Patient connected to face mask oxygen  Post-op Assessment: Report given to RN, Post -op Vital signs reviewed and stable and Patient moving all extremities X 4  Post vital signs: Reviewed and stable  Last Vitals:  Vitals Value Taken Time  BP 139/68 09/02/22 1231  Temp    Pulse 78 09/02/22 1236  Resp 21 09/02/22 1236  SpO2 97 % 09/02/22 1236  Vitals shown include unvalidated device data.  Last Pain:  Vitals:   09/02/22 0822  TempSrc: Oral  PainSc: 0-No pain      Patients Stated Pain Goal: 7 (15/40/08 6761)  Complications: No notable events documented.

## 2022-09-02 NOTE — Interval H&P Note (Signed)
History and Physical Interval Note:  09/02/2022 9:14 AM  Vincent Cooper  has presented today for surgery, with the diagnosis of VENTRAL HERNIA 11 CM DEFECT.  The various methods of treatment have been discussed with the patient and family. After consideration of risks, benefits and other options for treatment, the patient has consented to  Procedure(s): HERNIA REPAIR VENTRAL ADULT W/ MESH (N/A) as a surgical intervention.  The patient's history has been reviewed, patient examined, no change in status, stable for surgery.  I have reviewed the patient's chart and labs.  Questions were answered to the patient's satisfaction.    Marked will keep overnight for monitoring.   Virl Cagey

## 2022-09-02 NOTE — Anesthesia Postprocedure Evaluation (Signed)
Anesthesia Post Note  Patient: Vincent Cooper  Procedure(s) Performed: HERNIA REPAIR VENTRAL ADULT WITH  MESH (Abdomen)  Patient location during evaluation: Phase II Anesthesia Type: General Level of consciousness: awake Pain management: pain level controlled Vital Signs Assessment: post-procedure vital signs reviewed and stable Respiratory status: spontaneous breathing and respiratory function stable Cardiovascular status: blood pressure returned to baseline and stable Postop Assessment: no headache and no apparent nausea or vomiting Anesthetic complications: no Comments: Late entry   No notable events documented.   Last Vitals:  Vitals:   09/02/22 1300 09/02/22 1315  BP: (!) 135/115 (!) 134/112  Pulse: (!) 101 88  Resp: (!) 27 15  Temp:    SpO2: 95% 92%    Last Pain:  Vitals:   09/02/22 1300  TempSrc:   PainSc: 0-No pain                 Louann Sjogren

## 2022-09-02 NOTE — Progress Notes (Signed)
Patient began to itch and break out in a rash up his arms and on his chest. Dr. Briant Cedar ordered 25 mg of benadryl to be given.

## 2022-09-02 NOTE — Anesthesia Preprocedure Evaluation (Addendum)
Anesthesia Evaluation  Patient identified by MRN, date of birth, ID band Patient awake    Reviewed: Allergy & Precautions, H&P , NPO status , Patient's Chart, lab work & pertinent test results, reviewed documented beta blocker date and time   Airway Mallampati: II  TM Distance: >3 FB Neck ROM: full    Dental no notable dental hx. (+) Edentulous Upper, Edentulous Lower   Pulmonary shortness of breath, sleep apnea and Continuous Positive Airway Pressure Ventilation , COPD,  oxygen dependent, former smoker,    Pulmonary exam normal breath sounds clear to auscultation       Cardiovascular Exercise Tolerance: Good hypertension, + CAD   Rhythm:regular Rate:Normal     Neuro/Psych negative neurological ROS  negative psych ROS   GI/Hepatic negative GI ROS, Neg liver ROS,   Endo/Other  diabetes, Type 2Morbid obesity  Renal/GU Renal disease  negative genitourinary   Musculoskeletal   Abdominal   Peds  Hematology negative hematology ROS (+)   Anesthesia Other Findings   Reproductive/Obstetrics negative OB ROS                           Anesthesia Physical Anesthesia Plan  ASA: 3  Anesthesia Plan: General and General ETT   Post-op Pain Management:    Induction:   PONV Risk Score and Plan: Ondansetron  Airway Management Planned:   Additional Equipment:   Intra-op Plan:   Post-operative Plan:   Informed Consent: I have reviewed the patients History and Physical, chart, labs and discussed the procedure including the risks, benefits and alternatives for the proposed anesthesia with the patient or authorized representative who has indicated his/her understanding and acceptance.     Dental Advisory Given  Plan Discussed with: CRNA  Anesthesia Plan Comments:         Anesthesia Quick Evaluation

## 2022-09-03 ENCOUNTER — Encounter (HOSPITAL_COMMUNITY): Payer: Medicare HMO

## 2022-09-03 DIAGNOSIS — I2510 Atherosclerotic heart disease of native coronary artery without angina pectoris: Secondary | ICD-10-CM | POA: Diagnosis not present

## 2022-09-03 DIAGNOSIS — Z86711 Personal history of pulmonary embolism: Secondary | ICD-10-CM | POA: Diagnosis not present

## 2022-09-03 DIAGNOSIS — J449 Chronic obstructive pulmonary disease, unspecified: Secondary | ICD-10-CM | POA: Diagnosis not present

## 2022-09-03 DIAGNOSIS — Z87891 Personal history of nicotine dependence: Secondary | ICD-10-CM | POA: Diagnosis not present

## 2022-09-03 DIAGNOSIS — E119 Type 2 diabetes mellitus without complications: Secondary | ICD-10-CM | POA: Diagnosis not present

## 2022-09-03 DIAGNOSIS — I10 Essential (primary) hypertension: Secondary | ICD-10-CM | POA: Diagnosis not present

## 2022-09-03 DIAGNOSIS — K439 Ventral hernia without obstruction or gangrene: Secondary | ICD-10-CM | POA: Diagnosis not present

## 2022-09-03 LAB — CBC
HCT: 50 % (ref 39.0–52.0)
Hemoglobin: 16.8 g/dL (ref 13.0–17.0)
MCH: 31.8 pg (ref 26.0–34.0)
MCHC: 33.6 g/dL (ref 30.0–36.0)
MCV: 94.5 fL (ref 80.0–100.0)
Platelets: 209 10*3/uL (ref 150–400)
RBC: 5.29 MIL/uL (ref 4.22–5.81)
RDW: 14.6 % (ref 11.5–15.5)
WBC: 12.4 10*3/uL — ABNORMAL HIGH (ref 4.0–10.5)
nRBC: 0 % (ref 0.0–0.2)

## 2022-09-03 LAB — BASIC METABOLIC PANEL
Anion gap: 10 (ref 5–15)
BUN: 25 mg/dL — ABNORMAL HIGH (ref 8–23)
CO2: 25 mmol/L (ref 22–32)
Calcium: 8.1 mg/dL — ABNORMAL LOW (ref 8.9–10.3)
Chloride: 101 mmol/L (ref 98–111)
Creatinine, Ser: 1.45 mg/dL — ABNORMAL HIGH (ref 0.61–1.24)
GFR, Estimated: 49 mL/min — ABNORMAL LOW (ref >60)
Glucose, Bld: 294 mg/dL — ABNORMAL HIGH (ref 70–99)
Potassium: 4.1 mmol/L (ref 3.5–5.1)
Sodium: 136 mmol/L (ref 135–145)

## 2022-09-03 LAB — MAGNESIUM: Magnesium: 2 mg/dL (ref 1.7–2.4)

## 2022-09-03 LAB — PHOSPHORUS: Phosphorus: 2.7 mg/dL (ref 2.5–4.6)

## 2022-09-03 MED ORDER — APIXABAN 2.5 MG PO TABS: 2.5000 mg | ORAL_TABLET | Freq: Two times a day (BID) | ORAL | Status: AC

## 2022-09-03 MED ORDER — OXYCODONE HCL 5 MG PO TABS: 5.0000 mg | ORAL_TABLET | ORAL | 0 refills | Status: DC | PRN

## 2022-09-03 MED ORDER — ONDANSETRON 4 MG PO TBDP: 4.0000 mg | ORAL_TABLET | Freq: Four times a day (QID) | ORAL | 0 refills | Status: DC | PRN

## 2022-09-03 NOTE — Progress Notes (Signed)
Patient stable and ready for discharge home. Patient's sister came to pick up patient. Writer removed IV. Writer went over discharge paperwork with patient and he verbalized understanding. Patient dressed self. Patient transported via [facility] to patient's car.

## 2022-09-03 NOTE — Discharge Summary (Signed)
Physician Discharge Summary  Patient ID: Vincent Cooper MRN: [id number] DOB/AGE: 80-Nov-1942 80 y.o.  Admit date: 09/02/2022 Discharge date: 09/03/2022  Admission Diagnoses: Ventral hernias   Discharge Diagnoses:  Principal Problem:   Ventral hernia without obstruction or gangrene   Discharged Condition: good  Hospital Course: Patient stayed overnight after his ventral hernia repair for monitoring and to ensure he had good pain control and was able to breath well without SOB or issues with hypoxia given his history. He is doing well this AM and is eating. He has been ambulating to the restroom. He says his pain is controlled. He is wearing a binder.   Consults: None  Significant Diagnostic Studies:   Latest Reference Range & Units 09/03/22 05:40  WBC 4.0 - 10.5 K/uL 12.4 (H)  RBC 4.22 - 5.81 MIL/uL 5.29  Hemoglobin 13.0 - 17.0 g/dL 16.8  HCT 39.0 - 52.0 % 50.0  MCV 80.0 - 100.0 fL 94.5  MCH 26.0 - 34.0 pg 31.8  MCHC 30.0 - 36.0 g/dL 33.6  RDW 11.5 - 15.5 % 14.6  Platelets 150 - 400 K/uL 209  nRBC 0.0 - 0.2 % 0.0  (H): Data is abnormally high  Treatments: Ventral hernia repair with mesh 09/02/2022   Discharge Exam: Blood pressure 109/72, pulse 71, temperature (!) 97.5 F (36.4 C), temperature source Oral, resp. rate 20, height '5\' 9"'$  (1.753 m), weight 123.8 kg, SpO2 95 %. General appearance: alert and no distress Resp: normal work of breathing GI: soft, nondistended, appropriately tender, incisions c/d/I with staples, no erythem or drainage, honeycomb in place   Disposition: Discharge disposition: 01-Home or Self Care       Discharge Instructions     Call MD for:  difficulty breathing, headache or visual disturbances   Complete by: As directed    Call MD for:  extreme fatigue   Complete by: As directed    Call MD for:  persistant dizziness or light-headedness   Complete by: As directed    Call MD for:  persistant nausea and vomiting   Complete by: As directed     Call MD for:  redness, tenderness, or signs of infection (pain, swelling, redness, odor or green/yellow discharge around incision site)   Complete by: As directed    Call MD for:  severe uncontrolled pain   Complete by: As directed    Call MD for:  temperature >100.4   Complete by: As directed    Increase activity slowly   Complete by: As directed       Allergies as of 09/03/2022       Reactions   Ancef [cefazolin] Itching, Rash   Other    Patient reports he was allergic to something in an IV he was given but does not know what the substance was. As of 01/19/2020         Medication List     TAKE these medications    AeroChamber MV inhaler Use as instructed   albuterol 108 (90 Base) MCG/ACT inhaler Commonly known as: VENTOLIN HFA Inhale 1 puff into the lungs every 6 (six) hours as needed for wheezing or shortness of breath.   apixaban 2.5 MG Tabs tablet Commonly known as: Eliquis Take 1 tablet (2.5 mg total) by mouth 2 (two) times daily. Start taking on: September 05, 2022 What changed: These instructions start on September 05, 2022. If you are unsure what to do until then, ask your doctor or other care provider.   Breztri Aerosphere 160-9-4.8 MCG/ACT [employer]  Generic drug: Budeson-Glycopyrrol-Formoterol Inhale 2 puffs into the lungs in the morning and at bedtime.   diltiazem 180 MG 24 hr capsule Commonly known as: CARDIZEM CD Take 360 mg by mouth every morning.   docusate sodium 100 MG capsule Commonly known as: COLACE Take 100 mg by mouth daily as needed for moderate constipation.   hydroxypropyl methylcellulose / hypromellose 2.5 % ophthalmic solution Commonly known as: ISOPTO TEARS / GONIOVISC Place 1 drop into both eyes as needed for dry eyes.   ipratropium-albuterol 0.5-2.5 (3) MG/3ML Soln Commonly known as: DUONEB Take 3 mLs by nebulization every 6 (six) hours as needed (shortness of breath).   metoprolol tartrate 25 MG tablet Commonly known as:  LOPRESSOR Take 1 tablet (25 mg total) by mouth 2 (two) times daily.   nitroGLYCERIN 0.4 MG SL tablet Commonly known as: NITROSTAT Place 0.4 mg under the tongue every 5 (five) minutes as needed for chest pain.   NON FORMULARY Pt uses a cpap nightly   Nucala 100 MG/ML Soaj Generic drug: Mepolizumab Inject 1 mL (100 mg total) into the skin every 28 (twenty-eight) days.   ondansetron 4 MG disintegrating tablet Commonly known as: ZOFRAN-ODT Take 1 tablet (4 mg total) by mouth every 6 (six) hours as needed for nausea.   oxyCODONE 5 MG immediate release tablet Commonly known as: Oxy IR/ROXICODONE Take 1 tablet (5 mg total) by mouth every 4 (four) hours as needed for severe pain or breakthrough pain.   potassium chloride SA 20 MEQ tablet Commonly known as: KLOR-CON M Take 20 mEq by mouth daily.   rosuvastatin 40 MG tablet Commonly known as: CRESTOR TAKE 1 TABLET BY MOUTH ONCE DAILY. What changed: when to take this   tamsulosin 0.4 MG Caps capsule Commonly known as: FLOMAX Take 1 capsule (0.4 mg total) by mouth daily. What changed: when to take this   torsemide 20 MG tablet Commonly known as: DEMADEX Take 1 tablet (20 mg total) by mouth 2 (two) times daily. May take an extra 20 mg if needed for increase swelling What changed:  how much to take when to take this additional instructions        Follow-up Information     Virl Cagey, MD Follow up on 09/18/2022.   Specialty: General Surgery Why: staple removal Contact information: [address] [place] [address] [facility] [address] [phone]                 Signed: Virl Cagey 09/03/2022, 3:55 PM

## 2022-09-05 ENCOUNTER — Encounter (HOSPITAL_COMMUNITY): Payer: Medicare HMO

## 2022-09-06 ENCOUNTER — Encounter (HOSPITAL_COMMUNITY): Payer: Self-pay | Admitting: General Surgery

## 2022-09-10 ENCOUNTER — Encounter (HOSPITAL_COMMUNITY): Payer: Medicare HMO

## 2022-09-11 ENCOUNTER — Other Ambulatory Visit (HOSPITAL_COMMUNITY): Payer: Self-pay

## 2022-09-12 ENCOUNTER — Other Ambulatory Visit: Payer: Self-pay | Admitting: Cardiology

## 2022-09-12 ENCOUNTER — Encounter (HOSPITAL_COMMUNITY): Payer: Medicare HMO

## 2022-09-16 ENCOUNTER — Other Ambulatory Visit (HOSPITAL_COMMUNITY): Payer: Self-pay

## 2022-09-16 NOTE — Addendum Note (Signed)
Encounter addended by: Donnajean Lopes, RN on: 09/16/2022 2:51 PM  Actions taken: Flowsheet data copied forward, Flowsheet accepted

## 2022-09-17 ENCOUNTER — Encounter (HOSPITAL_COMMUNITY): Payer: Medicare HMO

## 2022-09-18 ENCOUNTER — Telehealth: Payer: Self-pay | Admitting: *Deleted

## 2022-09-18 ENCOUNTER — Encounter: Payer: Medicare HMO | Admitting: General Surgery

## 2022-09-18 ENCOUNTER — Other Ambulatory Visit (HOSPITAL_COMMUNITY): Payer: Self-pay

## 2022-09-18 NOTE — Telephone Encounter (Signed)
Surgical Date: 09/02/2022 Procedure: Ventral Hernia Repair w/ Mesh- medial x2 and lateral right abdomen  Patient seen in office to have staples removed from midline incision and lateral incision. Removed staples from midline incision first and lateral incision last. Reinforced with steri-strips. Patient tolerated removal well. No dehiscence or bleeding noted.   Patient reports no concerns with surgical sites. States that he continues to wear abdominal binder post op and will continue at night. No irritation to surrounding skin noted. No tenderness to palpation voiced. Skin warm and dry.   Post op follow up appointment scheduled with Dr. Constance Haw in 4 weeks.

## 2022-09-19 ENCOUNTER — Encounter (HOSPITAL_COMMUNITY): Payer: Medicare HMO

## 2022-09-20 DIAGNOSIS — N183 Chronic kidney disease, stage 3 unspecified: Secondary | ICD-10-CM | POA: Diagnosis not present

## 2022-09-20 DIAGNOSIS — Z86711 Personal history of pulmonary embolism: Secondary | ICD-10-CM | POA: Diagnosis not present

## 2022-09-20 DIAGNOSIS — E7849 Other hyperlipidemia: Secondary | ICD-10-CM | POA: Diagnosis not present

## 2022-09-20 DIAGNOSIS — I10 Essential (primary) hypertension: Secondary | ICD-10-CM | POA: Diagnosis not present

## 2022-09-20 DIAGNOSIS — E1122 Type 2 diabetes mellitus with diabetic chronic kidney disease: Secondary | ICD-10-CM | POA: Diagnosis not present

## 2022-09-24 ENCOUNTER — Encounter (HOSPITAL_COMMUNITY): Payer: Medicare HMO

## 2022-09-24 DIAGNOSIS — E1122 Type 2 diabetes mellitus with diabetic chronic kidney disease: Secondary | ICD-10-CM | POA: Diagnosis not present

## 2022-09-24 DIAGNOSIS — R609 Edema, unspecified: Secondary | ICD-10-CM | POA: Diagnosis not present

## 2022-09-24 DIAGNOSIS — M1710 Unilateral primary osteoarthritis, unspecified knee: Secondary | ICD-10-CM | POA: Diagnosis not present

## 2022-09-24 DIAGNOSIS — K5790 Diverticulosis of intestine, part unspecified, without perforation or abscess without bleeding: Secondary | ICD-10-CM | POA: Diagnosis not present

## 2022-09-24 DIAGNOSIS — Z86711 Personal history of pulmonary embolism: Secondary | ICD-10-CM | POA: Diagnosis not present

## 2022-09-24 DIAGNOSIS — N138 Other obstructive and reflux uropathy: Secondary | ICD-10-CM | POA: Diagnosis not present

## 2022-09-24 DIAGNOSIS — E7849 Other hyperlipidemia: Secondary | ICD-10-CM | POA: Diagnosis not present

## 2022-09-24 DIAGNOSIS — I4821 Permanent atrial fibrillation: Secondary | ICD-10-CM | POA: Diagnosis not present

## 2022-09-24 DIAGNOSIS — G4730 Sleep apnea, unspecified: Secondary | ICD-10-CM | POA: Diagnosis not present

## 2022-09-25 NOTE — Addendum Note (Signed)
Encounter addended by: Pattricia Boss, RN on: 09/25/2022 1:57 PM  Actions taken: Clinical Note Signed

## 2022-09-25 NOTE — Progress Notes (Signed)
Pulmonary Individual Treatment Plan  Patient Details  Name: Vincent Cooper MRN: [id number] Date of Birth: 05-Nov-1941 Referring Provider:   Flowsheet Row PULMONARY REHAB OTHER RESP ORIENTATION from 06/03/2022 in [place]  Referring Provider Dr. 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[place])  Patient's Home Medications on Admission:   Current Outpatient Medications:    albuterol (VENTOLIN HFA) 108 (90 Base) MCG/ACT inhaler, Inhale 1 puff into the lungs every 6 (six) hours as needed for wheezing or shortness of breath., Disp: , Rfl:    apixaban (ELIQUIS) 2.5 MG TABS tablet, Take 1 tablet (2.5 mg total) by mouth 2 (two) times daily., Disp: 60 tablet, Rfl:    Budeson-Glycopyrrol-Formoterol (BREZTRI AEROSPHERE) 160-9-4.8 MCG/ACT AERO, Inhale 2 puffs into the lungs in the morning and at bedtime., Disp: 10.7 g, Rfl: 11   diltiazem (CARDIZEM CD) 180 MG 24 hr capsule, Take 360 mg by mouth every morning., Disp: , Rfl:    docusate sodium (COLACE) 100 MG capsule, Take 100 mg by mouth daily as needed for moderate constipation., Disp: , Rfl:    hydroxypropyl methylcellulose / hypromellose (ISOPTO TEARS / GONIOVISC) 2.5 % ophthalmic solution, Place 1 drop into both eyes as needed for dry eyes., Disp: , Rfl:    ipratropium-albuterol (DUONEB) 0.5-2.5 (3) MG/3ML SOLN, Take 3 mLs by nebulization every 6 (six) hours as needed (shortness of breath). , Disp: , Rfl:    Mepolizumab (NUCALA) 100 MG/ML SOAJ, Inject 1 mL (100 mg total) into the skin every 28 (twenty-eight) days., Disp: 3 mL, Rfl: 1   metoprolol tartrate (LOPRESSOR) 25 MG tablet, Take 1 tablet (25 mg total) by mouth 2 (two) times daily., Disp: 60 tablet, Rfl: 3   nitroGLYCERIN (NITROSTAT) 0.4 MG SL tablet, Place 0.4 mg under the tongue every 5 (five) minutes as  needed for chest pain., Disp: , Rfl:    NON FORMULARY, Pt uses a cpap nightly, Disp: , Rfl:    ondansetron (ZOFRAN-ODT) 4 MG disintegrating tablet, Take 1 tablet (4 mg total) by mouth every 6 (six) hours as needed for nausea., Disp: 20 tablet, Rfl: 0   oxyCODONE (OXY IR/ROXICODONE) 5 MG immediate release tablet, Take 1 tablet (5 mg total) by mouth every 4 (four) hours as needed for severe pain or breakthrough pain., Disp: 20 tablet, Rfl: 0   potassium chloride SA (KLOR-CON) 20 MEQ tablet, Take 20 mEq by mouth daily., Disp: , Rfl:    rosuvastatin (CRESTOR) 40 MG tablet, TAKE 1 TABLET BY MOUTH ONCE DAILY., Disp: 90 tablet, Rfl: 3   Spacer/Aero-Holding Chambers (AEROCHAMBER MV) inhaler, Use as instructed, Disp: 1 each, Rfl: 0   tamsulosin (FLOMAX) 0.4 MG CAPS capsule, Take 1 capsule (0.4 mg total) by mouth daily. (Patient taking differently: Take 0.4 mg by mouth at bedtime.), Disp: 30 capsule, Rfl: 1   torsemide (DEMADEX) 20 MG tablet, Take 1 tablet (20 mg total) by mouth 2 (two) times daily. May take an extra 20 mg if needed for increase swelling (Patient taking differently: Take 20-40 mg by mouth See admin instructions. 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 Large Dom RCA-<PDA-PAV/PL - diffuse mild-mod plaque: prox (25-49%), mid (50-69%) CTFFR (p 0.99, m 0.85, d 0.81 - NS).  Med-Size LAD - prox-mid long diffuse Mod-Severe Ca++ plaque (~50-69%, ?>70%): CTFFR p 0.95, m 0.88, d 0.86 -NS.  CTFFR LCx 0.94, OM1 0.90 - NS. (NS=Not Significant).   Diabetes mellitus type II, non insulin dependent (HCC)    Hypertension    Morbid obesity (HCC)    BMI of 38.5 with multiple risk factors.   OSA on  CPAP    Pulmonary emboli ([place]) 10/2019   Chest CTA-4 [place] opacification of main PA but there is partially occlusive main posterior RLL and segmental/segmental branches.  Small thrombus noted in the anterior right middle lobe.  No RV strain.  Scattered aortic atherosclerosis involving great vessels.  Coronary calcification noted.   RLS (restless legs syndrome)     Tobacco Use: Social History   Tobacco Use  Smoking Status Former   Packs/day: 2.00   Years: 55.00   Total pack years: 110.00   Types: Cigarettes   Quit date: 11/18/2001   Years since quitting: 20.8  Smokeless Tobacco Former    Labs: Review Flowsheet  More data exists      Latest Ref Rng & Units 03/20/2020 06/17/2020 06/22/2020 06/23/2020 10/16/2020  Labs for ITP Cardiac and Pulmonary Rehab  Cholestrol 100 - 199 mg/dL 264  - - - -  LDL (calc) 0 - 99 mg/dL 135  - - - -  HDL-C >39 mg/dL 99  - - - -  Trlycerides <150 mg/dL 176  - 151  - -  Hemoglobin A1c 4.8 - 5.6 % - 5.4  - - 4.7   PH, Arterial 7.350 - 7.450 - - 7.421  7.399  -  PCO2 arterial 32.0 - 48.0 mmHg - - 36.4  39.3  -  Bicarbonate 20.0 - 28.0 mmol/L - - - 24.1  -  Acid-base deficit 0.0 - 2.0 mmol/L - - 0.6  0.4  -  O2 Saturation % - - >100.0  97.4  -    Capillary Blood Glucose: Lab Results  Component Value Date   GLUCAP 163 (H) 09/02/2022   GLUCAP 147 (H) 09/02/2022   GLUCAP 143 (H) 10/23/2020   GLUCAP 115 (H) 10/23/2020   GLUCAP 113 (H) 10/23/2020     Pulmonary Assessment Scores:  Pulmonary Assessment Scores     Row Name 06/03/22 1243         ADL UCSD   ADL Phase Entry     SOB Score total 50     Rest 0     Walk 2     Stairs 4     Bath 3     Dress 3     Shop 2       CAT Score   CAT Score 8       mMRC Score   mMRC Score 4             UCSD: Self-administered rating of dyspnea associated with activities of daily living (ADLs) 6-point scale (0 = "not at all" to 5 = "maximal or unable to do because of breathlessness")  Scoring Scores  range from 0 to 120.  Minimally important difference is 5 units  CAT: CAT can identify the health impairment of COPD patients and is better correlated with disease progression.  CAT has a scoring  range of zero to 40. The CAT score is classified into four groups of low (less than 10), medium (10 - 20), high (21-30) and very high (31-40) based on the impact level of disease on health status. A CAT score over 10 suggests significant symptoms.  A worsening CAT score could be explained by an exacerbation, poor medication adherence, poor inhaler technique, or progression of COPD or comorbid conditions.  CAT MCID is 2 points  mMRC: mMRC (Modified Medical Research Council) Dyspnea Scale is used to assess the degree of baseline functional disability in patients of respiratory disease due to dyspnea. No minimal important difference is established. A decrease in score of 1 point or greater is considered a positive change.   Pulmonary Function Assessment:   Exercise Target Goals: Exercise Program Goal: Individual exercise prescription set using results from initial 6 min walk test and THRR while considering  patient's activity barriers and safety.   Exercise Prescription Goal: Initial exercise prescription builds to 30-45 minutes a day of aerobic activity, 2-3 days per week.  Home exercise guidelines will be given to patient during program as part of exercise prescription that the participant will acknowledge.  Activity Barriers & Risk Stratification:  Activity Barriers & Cardiac Risk Stratification - 06/03/22 1302       Activity Barriers & Cardiac Risk Stratification   Activity Barriers Deconditioning;Shortness of Breath;Assistive Device    Cardiac Risk Stratification High             6 Minute Walk:  6 Minute Walk     Row Name 06/03/22 1304         6 Minute Walk   Phase Initial     Distance 600 feet     Walk Time 6 minutes     # of Rest Breaks 1     MPH 1.13     METS 0.56      RPE 12     Perceived Dyspnea  11     VO2 Peak 1.99     Symptoms Yes (comment)     Comments One sitting break for 1 minute due to pt feeling weak and tired     Resting HR 85 bpm     Resting BP 112/76     Resting Oxygen Saturation  95 %     Exercise Oxygen Saturation  during 6 min walk 93 %     Max Ex. HR 126 bpm     Max Ex. BP 120/70     2 Minute Post BP 110/68       Interval HR   1 Minute HR 124     2 Minute HR 124     3 Minute HR 120     4 Minute HR 128     5 Minute HR 120     6 Minute HR 126     2 Minute Post HR 97     Interval Heart Rate? Yes       Interval Oxygen   Interval Oxygen? Yes     Baseline Oxygen Saturation % 95 %     1 Minute Oxygen Saturation % 92 %     1 Minute Liters of Oxygen 0 L     2 Minute Oxygen Saturation % 93 %     2 Minute Liters of Oxygen 0 L     3 Minute Oxygen Saturation % 94 %     3 Minute Liters of Oxygen 0 L     4 Minute Oxygen  Saturation % 95 %     4 Minute Liters of Oxygen 0 L     5 Minute Oxygen Saturation % 94 %     5 Minute Liters of Oxygen 0 L     6 Minute Oxygen Saturation % 93 %     6 Minute Liters of Oxygen 0 L     2 Minute Post Oxygen Saturation % 96 %     2 Minute Post Liters of Oxygen 0 L              Oxygen Initial Assessment:  Oxygen Initial Assessment - 06/03/22 1305       Home Oxygen   Home Oxygen Device None    Sleep Oxygen Prescription CPAP    Home Exercise Oxygen Prescription None    Home Resting Oxygen Prescription None    Compliance with Home Oxygen Use Yes      Initial 6 min Walk   Oxygen Used None      Program Oxygen Prescription   Program Oxygen Prescription None      Intervention   Short Term Goals To learn and understand importance of monitoring SPO2 with pulse oximeter and demonstrate accurate use of the pulse oximeter.;To learn and understand importance of maintaining oxygen saturations>88%;To learn and demonstrate proper pursed lip breathing techniques or other breathing techniques.     Long   Term Goals Verbalizes importance of monitoring SPO2 with pulse oximeter and return demonstration;Maintenance of O2 saturations>88%;Exhibits proper breathing techniques, such as pursed lip breathing or other method taught during program session;Compliance with respiratory medication             Oxygen Re-Evaluation:  Oxygen Re-Evaluation     Row Name 07/02/22 1429 07/30/22 1434 08/27/22 1424         Program Oxygen Prescription   Program Oxygen Prescription None None None       Home Oxygen   Home Oxygen Device None None None     Sleep Oxygen Prescription CPAP CPAP CPAP     Home Exercise Oxygen Prescription None None None     Home Resting Oxygen Prescription -- None None     Compliance with Home Oxygen Use Yes Yes Yes       Goals/Expected Outcomes   Short Term Goals To learn and understand importance of monitoring SPO2 with pulse oximeter and demonstrate accurate use of the pulse oximeter.;To learn and understand importance of maintaining oxygen saturations>88%;To learn and demonstrate proper pursed lip breathing techniques or other breathing techniques.  To learn and understand importance of monitoring SPO2 with pulse oximeter and demonstrate accurate use of the pulse oximeter.;To learn and understand importance of maintaining oxygen saturations>88%;To learn and demonstrate proper pursed lip breathing techniques or other breathing techniques.  To learn and understand importance of monitoring SPO2 with pulse oximeter and demonstrate accurate use of the pulse oximeter.;To learn and understand importance of maintaining oxygen saturations>88%;To learn and demonstrate proper pursed lip breathing techniques or other breathing techniques.      Long  Term Goals Verbalizes importance of monitoring SPO2 with pulse oximeter and return demonstration;Maintenance of O2 saturations>88%;Exhibits proper breathing techniques, such as pursed lip breathing or other method taught during program  session;Compliance with respiratory medication Verbalizes importance of monitoring SPO2 with pulse oximeter and return demonstration;Maintenance of O2 saturations>88%;Exhibits proper breathing techniques, such as pursed lip breathing or other method taught during program session;Compliance with respiratory medication Verbalizes importance of monitoring SPO2 with pulse oximeter and return demonstration;Maintenance of O2 saturations>88%;Exhibits proper  breathing techniques, such as pursed lip breathing or other method taught during program session;Compliance with respiratory medication     Goals/Expected Outcomes compliance compliance compliance              Oxygen Discharge (Final Oxygen Re-Evaluation):  Oxygen Re-Evaluation - 08/27/22 1424       Program Oxygen Prescription   Program Oxygen Prescription None      Home Oxygen   Home Oxygen Device None    Sleep Oxygen Prescription CPAP    Home Exercise Oxygen Prescription None    Home Resting Oxygen Prescription None    Compliance with Home Oxygen Use Yes      Goals/Expected Outcomes   Short Term Goals To learn and understand importance of monitoring SPO2 with pulse oximeter and demonstrate accurate use of the pulse oximeter.;To learn and understand importance of maintaining oxygen saturations>88%;To learn and demonstrate proper pursed lip breathing techniques or other breathing techniques.     Long  Term Goals Verbalizes importance of monitoring SPO2 with pulse oximeter and return demonstration;Maintenance of O2 saturations>88%;Exhibits proper breathing techniques, such as pursed lip breathing or other method taught during program session;Compliance with respiratory medication    Goals/Expected Outcomes compliance             Initial Exercise Prescription:  Initial Exercise Prescription - 06/03/22 1300       Date of Initial Exercise RX and Referring Provider   Date 06/03/22    Referring Provider Dr. Verlee Monte    Expected Discharge  Date 10/08/22      NuStep   Level 1    SPM 60    Minutes 17      Arm Ergometer   Level 1    RPM 30    Minutes 22      Prescription Details   Frequency (times per week) 2    Duration Progress to 30 minutes of continuous aerobic without signs/symptoms of physical distress      Intensity   THRR 40-80% of Max Heartrate 56-112    Ratings of Perceived Exertion 11-13    Perceived Dyspnea 0-4      Resistance Training   Training Prescription Yes    Weight 3    Reps 10-15             Perform Capillary Blood Glucose checks as needed.  Exercise Prescription Changes:   Exercise Prescription Changes     Row Name 06/18/22 1500 07/02/22 1400 07/16/22 1400 07/30/22 1400 08/15/22 1400     Response to Exercise   Blood Pressure (Admit) 132/62 120/62 108/70 124/74 120/32   Blood Pressure (Exercise) 140/70 128/72 130/74 122/72 132/80   Blood Pressure (Exit) 120/70 104/70 112/62 122/60 118/70   Heart Rate (Admit) 98 bpm 96 bpm 96 bpm 92 bpm 85 bpm   Heart Rate (Exercise) 116 bpm 118 bpm 114 bpm 107 bpm 108 bpm   Heart Rate (Exit) 92 bpm 98 bpm 85 bpm 90 bpm 90 bpm   Oxygen Saturation (Admit) 94 % 96 % 94 % 95 % 94 %   Oxygen Saturation (Exercise) 94 % 94 % 93 % 93 % 94 %   Oxygen Saturation (Exit) 94 % 96 % 94 % 96 % 96 %   Rating of Perceived Exertion (Exercise) '12 12 12 12 12   '$ Perceived Dyspnea (Exercise) '11 12 12 12 12   '$ Duration Continue with 30 min of aerobic exercise without signs/symptoms of physical distress. Continue with 30 min of aerobic exercise without signs/symptoms  of physical distress. Continue with 30 min of aerobic exercise without signs/symptoms of physical distress. Continue with 30 min of aerobic exercise without signs/symptoms of physical distress. Continue with 30 min of aerobic exercise without signs/symptoms of physical distress.   Intensity THRR unchanged THRR unchanged THRR unchanged THRR unchanged THRR unchanged     Progression   Progression Continue to  progress workloads to maintain intensity without signs/symptoms of physical distress. Continue to progress workloads to maintain intensity without signs/symptoms of physical distress. Continue to progress workloads to maintain intensity without signs/symptoms of physical distress. Continue to progress workloads to maintain intensity without signs/symptoms of physical distress. Continue to progress workloads to maintain intensity without signs/symptoms of physical distress.     Resistance Training   Training Prescription Yes Yes Yes Yes Yes   Weight '3 3 4 4 4   '$ Reps 10-15 10-15 10-15 10-15 10-15   Time 10 Minutes 10 Minutes 10 Minutes 10 Minutes 10 Minutes     NuStep   Level '1 2 2 2 3   '$ SPM 74 85 92 85 85   Minutes '22 22 22 22 22   '$ METs 1.8 1.8 2 1.9 2     Arm Ergometer   Level '1 2 2 2 3   '$ RPM 38 43 43 40 49   Minutes '17 17 17 17 17   '$ METs 1.4 1.4 1.6 1.5 1.9    Row Name 08/27/22 1400             Response to Exercise   Blood Pressure (Admit) 108/68       Blood Pressure (Exercise) 124/70       Blood Pressure (Exit) 128/70       Heart Rate (Admit) 84 bpm       Heart Rate (Exercise) 93 bpm       Heart Rate (Exit) 84 bpm       Oxygen Saturation (Admit) 93 %       Oxygen Saturation (Exercise) 93 %       Oxygen Saturation (Exit) 95 %       Rating of Perceived Exertion (Exercise) 12       Perceived Dyspnea (Exercise) 12       Duration Continue with 30 min of aerobic exercise without signs/symptoms of physical distress.       Intensity THRR unchanged         Progression   Progression Continue to progress workloads to maintain intensity without signs/symptoms of physical distress.         Resistance Training   Training Prescription Yes       Weight 4       Reps 10-15       Time 10 Minutes         NuStep   Level 3       SPM 83       Minutes 22       METs 2         Arm Ergometer   Level 3.4       RPM 43       Minutes 17       METs 1.9                Exercise  Comments:   Exercise Goals and Review:   Exercise Goals     Row Name 07/02/22 1427 07/30/22 1431 08/27/22 1421         Exercise Goals   Increase Physical Activity Yes Yes  Yes     Intervention Provide advice, education, support and counseling about physical activity/exercise needs.;Develop an individualized exercise prescription for aerobic and resistive training based on initial evaluation findings, risk stratification, comorbidities and participant's personal goals. Provide advice, education, support and counseling about physical activity/exercise needs.;Develop an individualized exercise prescription for aerobic and resistive training based on initial evaluation findings, risk stratification, comorbidities and participant's personal goals. Provide advice, education, support and counseling about physical activity/exercise needs.;Develop an individualized exercise prescription for aerobic and resistive training based on initial evaluation findings, risk stratification, comorbidities and participant's personal goals.     Expected Outcomes Short Term: Attend rehab on a regular basis to increase amount of physical activity.;Long Term: Add in home exercise to make exercise part of routine and to increase amount of physical activity.;Long Term: Exercising regularly at least 3-5 days a week. Short Term: Attend rehab on a regular basis to increase amount of physical activity.;Long Term: Add in home exercise to make exercise part of routine and to increase amount of physical activity.;Long Term: Exercising regularly at least 3-5 days a week. Short Term: Attend rehab on a regular basis to increase amount of physical activity.;Long Term: Add in home exercise to make exercise part of routine and to increase amount of physical activity.;Long Term: Exercising regularly at least 3-5 days a week.     Increase Strength and Stamina Yes Yes Yes     Intervention Develop an individualized exercise prescription for  aerobic and resistive training based on initial evaluation findings, risk stratification, comorbidities and participant's personal goals.;Provide advice, education, support and counseling about physical activity/exercise needs. Develop an individualized exercise prescription for aerobic and resistive training based on initial evaluation findings, risk stratification, comorbidities and participant's personal goals.;Provide advice, education, support and counseling about physical activity/exercise needs. Develop an individualized exercise prescription for aerobic and resistive training based on initial evaluation findings, risk stratification, comorbidities and participant's personal goals.;Provide advice, education, support and counseling about physical activity/exercise needs.     Expected Outcomes Short Term: Increase workloads from initial exercise prescription for resistance, speed, and METs.;Short Term: Perform resistance training exercises routinely during rehab and add in resistance training at home;Long Term: Improve cardiorespiratory fitness, muscular endurance and strength as measured by increased METs and functional capacity (6MWT) Short Term: Increase workloads from initial exercise prescription for resistance, speed, and METs.;Short Term: Perform resistance training exercises routinely during rehab and add in resistance training at home;Long Term: Improve cardiorespiratory fitness, muscular endurance and strength as measured by increased METs and functional capacity (6MWT) Short Term: Increase workloads from initial exercise prescription for resistance, speed, and METs.;Short Term: Perform resistance training exercises routinely during rehab and add in resistance training at home;Long Term: Improve cardiorespiratory fitness, muscular endurance and strength as measured by increased METs and functional capacity (6MWT)     Able to understand and use rate of perceived exertion (RPE) scale Yes Yes Yes      Intervention Provide education and explanation on how to use RPE scale Provide education and explanation on how to use RPE scale Provide education and explanation on how to use RPE scale     Expected Outcomes Short Term: Able to use RPE daily in rehab to express subjective intensity level;Long Term:  Able to use RPE to guide intensity level when exercising independently Short Term: Able to use RPE daily in rehab to express subjective intensity level;Long Term:  Able to use RPE to guide intensity level when exercising independently Short Term: Able to use RPE daily  in rehab to express subjective intensity level;Long Term:  Able to use RPE to guide intensity level when exercising independently     Able to understand and use Dyspnea scale Yes Yes Yes     Intervention Provide education and explanation on how to use Dyspnea scale Provide education and explanation on how to use Dyspnea scale Provide education and explanation on how to use Dyspnea scale     Expected Outcomes Short Term: Able to use Dyspnea scale daily in rehab to express subjective sense of shortness of breath during exertion;Long Term: Able to use Dyspnea scale to guide intensity level when exercising independently Short Term: Able to use Dyspnea scale daily in rehab to express subjective sense of shortness of breath during exertion;Long Term: Able to use Dyspnea scale to guide intensity level when exercising independently Short Term: Able to use Dyspnea scale daily in rehab to express subjective sense of shortness of breath during exertion;Long Term: Able to use Dyspnea scale to guide intensity level when exercising independently     Knowledge and understanding of Target Heart Rate Range (THRR) Yes Yes Yes     Intervention Provide education and explanation of THRR including how the numbers were predicted and where they are located for reference Provide education and explanation of THRR including how the numbers were predicted and where they are  located for reference Provide education and explanation of THRR including how the numbers were predicted and where they are located for reference     Expected Outcomes Short Term: Able to state/look up THRR;Long Term: Able to use THRR to govern intensity when exercising independently;Short Term: Able to use daily as guideline for intensity in rehab Short Term: Able to state/look up THRR;Long Term: Able to use THRR to govern intensity when exercising independently;Short Term: Able to use daily as guideline for intensity in rehab Short Term: Able to state/look up THRR;Long Term: Able to use THRR to govern intensity when exercising independently;Short Term: Able to use daily as guideline for intensity in rehab     Understanding of Exercise Prescription Yes Yes Yes     Intervention Provide education, explanation, and written materials on patient's individual exercise prescription Provide education, explanation, and written materials on patient's individual exercise prescription Provide education, explanation, and written materials on patient's individual exercise prescription     Expected Outcomes Short Term: Able to explain program exercise prescription;Long Term: Able to explain home exercise prescription to exercise independently Short Term: Able to explain program exercise prescription;Long Term: Able to explain home exercise prescription to exercise independently Short Term: Able to explain program exercise prescription;Long Term: Able to explain home exercise prescription to exercise independently              Exercise Goals Re-Evaluation :  Exercise Goals Re-Evaluation     Row Name 07/02/22 1427 07/30/22 1432 08/27/22 1421         Exercise Goal Re-Evaluation   Exercise Goals Review Increase Physical Activity;Increase Strength and Stamina;Able to understand and use Dyspnea scale;Able to understand and use rate of perceived exertion (RPE) scale;Knowledge and understanding of Target Heart Rate  Range (THRR);Understanding of Exercise Prescription Increase Physical Activity;Increase Strength and Stamina;Able to understand and use rate of perceived exertion (RPE) scale;Able to understand and use Dyspnea scale;Knowledge and understanding of Target Heart Rate Range (THRR);Understanding of Exercise Prescription Increase Strength and Stamina;Increase Physical Activity;Able to understand and use rate of perceived exertion (RPE) scale;Able to understand and use Dyspnea scale;Knowledge and understanding of Target Heart Rate Range (  THRR);Understanding of Exercise Prescription     Comments Pt has completed 8 sessions of PR. He comes to class and is motivated. He has started to increase his workload on both sets of equipment. He is currently exercising at 1.8 METs on the stepper. Will continue to montior and progress as able. Pt has completed 16 sessions of PR. He is eagar to come to class and is motivated to start. He is currently exercising 1.9 METs on the stepper. Will cotinue to monitor and progress as able. Pt has completed 24 sessions of PR. He is motivated during class and has increased his worklaod. He is currently exercising at 2.0 METs on the stepper. Will continue to monitor and progress as able.     Expected Outcomes Through exercise at home and at rehab, the patient will meet their stated goals. Through exercise at home and at rehab, the patient will meet their stated goals. Through exercise at home and at rehab, the patient will meet their stated goals.              Discharge Exercise Prescription (Final Exercise Prescription Changes):  Exercise Prescription Changes - 08/27/22 1400       Response to Exercise   Blood Pressure (Admit) 108/68    Blood Pressure (Exercise) 124/70    Blood Pressure (Exit) 128/70    Heart Rate (Admit) 84 bpm    Heart Rate (Exercise) 93 bpm    Heart Rate (Exit) 84 bpm    Oxygen Saturation (Admit) 93 %    Oxygen Saturation (Exercise) 93 %    Oxygen  Saturation (Exit) 95 %    Rating of Perceived Exertion (Exercise) 12    Perceived Dyspnea (Exercise) 12    Duration Continue with 30 min of aerobic exercise without signs/symptoms of physical distress.    Intensity THRR unchanged      Progression   Progression Continue to progress workloads to maintain intensity without signs/symptoms of physical distress.      Resistance Training   Training Prescription Yes    Weight 4    Reps 10-15    Time 10 Minutes      NuStep   Level 3    SPM 83    Minutes 22    METs 2      Arm Ergometer   Level 3.4    RPM 43    Minutes 17    METs 1.9             Nutrition:  Target Goals: Understanding of nutrition guidelines, daily intake of sodium '1500mg'$ , cholesterol '200mg'$ , calories 30% from fat and 7% or less from saturated fats, daily to have 5 or more servings of fruits and vegetables.  Biometrics:  Pre Biometrics - 06/03/22 1308       Pre Biometrics   Height '5\' 9"'$  (1.753 m)    Weight 122 kg    Waist Circumference 55 inches    Hip Circumference 56 inches    Waist to Hip Ratio 0.98 %    BMI (Calculated) 39.7    Triceps Skinfold 25 mm    % Body Fat 41.6 %    Grip Strength 23.3 kg    Flexibility 0 in    Single Leg Stand 0 seconds              Nutrition Therapy Plan and Nutrition Goals:  Nutrition Therapy & Goals - 06/24/22 1444       Personal Nutrition Goals   Comments Patient scored  21 on his diet assessment.  We offer 2 educational sessions on heart healthy nutrition with hanhouts and offer assistance with RD  referral if patient is interested.      Intervention Plan   Intervention Nutrition handout(s) given to patient.    Expected Outcomes Short Term Goal: Understand basic principles of dietary content, such as calories, fat, sodium, cholesterol and nutrients.             Nutrition Assessments:  Nutrition Assessments - 06/03/22 1245       MEDFICTS Scores   Pre Score 21            MEDIFICTS Score  Key: ?70 Need to make dietary changes  40-70 Heart Healthy Diet ? 40 Therapeutic Level Cholesterol Diet   Picture Your Plate Scores: <78 Unhealthy dietary pattern with much room for improvement. 41-50 Dietary pattern unlikely to meet recommendations for good health and room for improvement. 51-60 More healthful dietary pattern, with some room for improvement.  >60 Healthy dietary pattern, although there may be some specific behaviors that could be improved.    Nutrition Goals Re-Evaluation:   Nutrition Goals Discharge (Final Nutrition Goals Re-Evaluation):   Psychosocial: Target Goals: Acknowledge presence or absence of significant depression and/or stress, maximize coping skills, provide positive support system. Participant is able to verbalize types and ability to use techniques and skills needed for reducing stress and depression.  Initial Review & Psychosocial Screening:  Initial Psych Review & Screening - 06/03/22 1305       Initial Review   Current issues with None Identified      Family Dynamics   Good Support System? Yes    Comments His daughter is his main support system.      Barriers   Psychosocial barriers to participate in program There are no identifiable barriers or psychosocial needs.      Screening Interventions   Interventions Encouraged to exercise    Expected Outcomes Long Term goal: The participant improves quality of Life and PHQ9 Scores as seen by post scores and/or verbalization of changes;Short Term goal: Identification and review with participant of any Quality of Life or Depression concerns found by scoring the questionnaire.             Quality of Life Scores:  Quality of Life - 06/03/22 1309       Quality of Life   Select Quality of Life      Quality of Life Scores   Health/Function Pre 25.72 %    Socioeconomic Pre 30 %    Psych/Spiritual Pre 30 %    Family Pre 30 %    GLOBAL Pre 27.92 %            Scores of 19 and below  usually indicate a poorer quality of life in these areas.  A difference of  2-3 points is a clinically meaningful difference.  A difference of 2-3 points in the total score of the Quality of Life Index has been associated with significant improvement in overall quality of life, self-image, physical symptoms, and general health in studies assessing change in quality of life.   PHQ-9: Review Flowsheet       06/03/2022  Depression screen PHQ 2/9  Decreased Interest 0  Down, Depressed, Hopeless 0  PHQ - 2 Score 0  Altered sleeping 0  Tired, decreased energy 2  Change in appetite 0  Feeling bad or failure about yourself  0  Trouble concentrating 0  Moving slowly or fidgety/restless  0  Suicidal thoughts 0  PHQ-9 Score 2  Difficult doing work/chores Not difficult at all   Interpretation of Total Score  Total Score Depression Severity:  1-4 = Minimal depression, 5-9 = Mild depression, 10-14 = Moderate depression, 15-19 = Moderately severe depression, 20-27 = Severe depression   Psychosocial Evaluation and Intervention:  Psychosocial Evaluation - 06/03/22 1324       Psychosocial Evaluation & Interventions   Interventions Encouraged to exercise with the program and follow exercise prescription    Comments Pt has no barriers to participate in PR. He has no identifiable psychosocial issues. 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 His health conditions cause him to take frequent breaks while doing these activities and he has to sit while doing them. He reports that he has a good support system with his daughter. Her reports that he divorced his wife three years ago and he is no longer stressed now that he is divorced. His goals while in the program are to lose weight and to decrease his SOB with his ADL's. He is eager to  begin the program.    Expected Outcomes Pt will continue to have no identifiable psychosocial issues.    Continue Psychosocial Services  No Follow up required             Psychosocial Re-Evaluation:  Psychosocial Re-Evaluation     [place] Name 06/24/22 1426 07/24/22 1423 08/19/22 1503 09/16/22 1443       Psychosocial Re-Evaluation   Current issues with None Identified None Identified None Identified None Identified    Comments Patient is new to the program completing 5 sessions.  His initial QOL score is 27.92% and his PHQ-9 score was 2.  He seems to enjoy coming to the program.  He also interacts  with class and staff.  Patient seems to enjoy coming to class and demonstrates a very positive outlook.  We will continue to monitor as he progresses in the program.   He will continue to have no psychosicial issues identified. Patient has completed 14 sessions.   He seems to enjoy coming to the program.  Patient enjoys socializing and interacts well with class and staff, also very talkative and friendly.   He also demonstrates a very positive outlook and attitude,  he continues to work hard in [place].  We will continue to monitor as he progresses in the program.   He will continue to have no psychosicial issues identified. Patient has completed 21 sessions.   He seems to enjoy coming to the program.  Patient enjoys socializing and interacts well with class and staff, also very talkative and friendly.   He also demonstrates a very positive outlook and attitude,  he continues to work hard in [place].  We will continue to monitor as he progresses in the program.   He will continue to have no psychosicial issues identified. Patient has completed 25 sessions.  He seems to enjoy coming to the program.  Patient enjoys socializing and interacts well with class and staff, also very talkative and friendly.   He also demonstrates a very positive outlook and attitude,  he continues to work hard in [place].  We will continue to  monitor as he progresses in the program. Curently he has been out due to having hernia repair surgery on October  17th .   He will continue to have no psychosicial issues identified.    Expected Outcomes Patient will have no psychosocial barries indentified at discharge. Patient will have no psychosocial barries indentified at discharge. Patient will have no psychosocial barries indentified at discharge. Patient will have no psychosocial barries indentified at discharge.    Interventions Relaxation education;Encouraged to attend Pulmonary Rehabilitation for the exercise;Stress management education Relaxation education;Encouraged to attend Pulmonary Rehabilitation for the exercise;Stress management education Relaxation education;Encouraged to attend Pulmonary Rehabilitation for the exercise;Stress management education Relaxation education;Encouraged to attend Pulmonary Rehabilitation for the exercise;Stress management education    Continue Psychosocial Services  No Follow up required No Follow up required No Follow up required No Follow up required             Psychosocial Discharge (Final Psychosocial Re-Evaluation):  Psychosocial Re-Evaluation - 09/16/22 1443       Psychosocial Re-Evaluation   Current issues with None Identified    Comments Patient has completed 25 sessions.  He seems to enjoy coming to the program.  Patient enjoys socializing and interacts well with class and staff, also very talkative and friendly.   He also demonstrates a very positive outlook and attitude,  he continues to work hard in [place].  We will continue to monitor as he progresses in the program. Curently he has been out due to having hernia repair surgery on October 17th .   He will continue to have no psychosicial issues identified.    Expected Outcomes Patient will have no psychosocial barries indentified at discharge.    Interventions Relaxation education;Encouraged to attend Pulmonary Rehabilitation for the  exercise;Stress management education    Continue Psychosocial Services  No Follow up required              Education: Education Goals: Education classes will be provided on a weekly basis, covering required topics. Participant will state understanding/return demonstration of topics presented.  Learning Barriers/Preferences:  Learning Barriers/Preferences - 06/03/22 1308       Learning Barriers/Preferences   Learning Barriers None    Learning Preferences Audio;Verbal Instruction             Education Topics: How Lungs Work and Diseases: - Discuss the anatomy of the lungs and diseases that can affect the lungs, such as COPD. Flowsheet Row PULMONARY REHAB OTHER RESPIRATORY from 08/29/2022 in [place]  Date 08/22/22  Educator DF  Instruction Review Code 2- Demonstrated Understanding       Exercise: -Discuss the importance of exercise, FITT principles of exercise, normal and abnormal responses to exercise, and how to exercise safely.   Environmental Irritants: -Discuss types of environmental irritants and how to limit exposure to environmental irritants. Flowsheet Row PULMONARY REHAB OTHER RESPIRATORY from 08/29/2022 in [place]  Date 08/29/22  Educator [place]  Instruction Review Code 1- Verbalizes Understanding       Meds/Inhalers and oxygen: - Discuss respiratory medications, definition of an inhaler and oxygen, and the proper way to use an inhaler and oxygen. Flowsheet Row PULMONARY REHAB OTHER RESPIRATORY from 08/29/2022 in [place]  Date 06/06/22  Educator HJ       Energy Saving Techniques: - Discuss methods to conserve energy and decrease shortness of breath when performing activities of daily living.  Flowsheet Row PULMONARY REHAB OTHER RESPIRATORY from 08/29/2022 in [place]  Date 06/13/22  Educator pb  Instruction Review Code 1- Verbalizes Understanding  Bronchial Hygiene / Breathing Techniques: - Discuss breathing mechanics, pursed-lip breathing technique,  proper posture, effective ways to clear airways, and other functional breathing techniques Flowsheet Row PULMONARY REHAB OTHER RESPIRATORY from 08/29/2022 in [place]  Date 06/20/22  Educator DF  Instruction Review Code 1- Verbalizes Understanding       Cleaning Equipment: - Provides group verbal and written instruction about the health risks of elevated stress, cause of high stress, and healthy ways to reduce stress. Flowsheet Row PULMONARY REHAB OTHER RESPIRATORY from 08/29/2022 in [place]  Date 06/27/22  Educator pb  Instruction Review Code 1- Verbalizes Understanding       Nutrition I: Fats: - Discuss the types of cholesterol, what cholesterol does to the body, and how cholesterol levels can be controlled. Flowsheet Row PULMONARY REHAB OTHER RESPIRATORY from 08/29/2022 in [place]  Date 07/04/22  Educator pb  Instruction Review Code 1- Verbalizes Understanding       Nutrition II: Labels: -Discuss the different components of food labels and how to read food labels. Flowsheet Row PULMONARY REHAB OTHER RESPIRATORY from 08/29/2022 in [place]  Date 07/11/22  Educator Handout  Instruction Review Code 1- Verbalizes Understanding       Respiratory Infections: - Discuss the signs and symptoms of respiratory infections, ways to prevent respiratory infections, and the importance of seeking medical treatment when having a respiratory infection. Flowsheet Row PULMONARY REHAB OTHER RESPIRATORY from 08/29/2022 in [place]  Date 07/25/22  Educator DM  Instruction Review Code 1- Verbalizes Understanding       Stress I: Signs and Symptoms: - Discuss the causes of stress, how stress may lead to anxiety and depression, and ways to limit  stress. Flowsheet Row PULMONARY REHAB OTHER RESPIRATORY from 08/29/2022 in [place]  Date 08/01/22  Educator [place]  Instruction Review Code 1- Verbalizes Understanding       Stress II: Relaxation: -Discuss relaxation techniques to limit stress. Flowsheet Row PULMONARY REHAB OTHER RESPIRATORY from 08/29/2022 in [place]  Date 08/08/22  Educator pb  Instruction Review Code 1- Verbalizes Understanding       Oxygen for Home/Travel: - Discuss how to prepare for travel when on oxygen and proper ways to transport and store oxygen to ensure safety. Flowsheet Row PULMONARY REHAB OTHER RESPIRATORY from 08/29/2022 in [place]  Date 08/15/22  Educator DF  Instruction Review Code 1- Verbalizes Understanding       Knowledge Questionnaire Score:  Knowledge Questionnaire Score - 06/03/22 1245       Knowledge Questionnaire Score   Pre Score 10/18             Core Components/Risk Factors/Patient Goals at Admission:  Personal Goals and Risk Factors at Admission - 06/03/22 1311       Core Components/Risk Factors/Patient Goals on Admission    Weight Management Yes;Obesity;Weight Loss    Intervention Obesity: Provide education and appropriate resources to help participant work on and attain dietary goals.;Weight Management/Obesity: Establish reasonable short term and long term weight goals.;Weight Management: Provide education and appropriate resources to help participant work on and attain dietary goals.;Weight Management: Develop a combined nutrition and exercise program designed to reach desired caloric intake, while maintaining appropriate intake of nutrient and fiber, sodium and fats, and appropriate energy expenditure required for the weight goal.    Expected Outcomes Short Term: Continue to assess and modify interventions until short term weight is achieved;Long  Term: Adherence to nutrition and physical  activity/exercise program aimed toward attainment of established weight goal;Weight Maintenance: Understanding of the daily nutrition guidelines, which includes 25-35% calories from fat, 7% or less cal from saturated fats, less than '200mg'$  cholesterol, less than 1.5gm of sodium, & 5 or more servings of fruits and vegetables daily;Understanding recommendations for meals to include 15-35% energy as protein, 25-35% energy from fat, 35-60% energy from carbohydrates, less than '200mg'$  of dietary cholesterol, 20-35 gm of total fiber daily;Understanding of distribution of calorie intake throughout the day with the consumption of 4-5 meals/snacks;Weight Gain: Understanding of general recommendations for a high calorie, high protein meal plan that promotes weight gain by distributing calorie intake throughout the day with the consumption for 4-5 meals, snacks, and/or supplements    Improve shortness of breath with ADL's Yes    Intervention Provide education, individualized exercise plan and daily activity instruction to help decrease symptoms of SOB with activities of daily living.    Expected Outcomes Short Term: Improve cardiorespiratory fitness to achieve a reduction of symptoms when performing ADLs;Long Term: Be able to perform more ADLs without symptoms or delay the onset of symptoms    Diabetes Yes    Intervention Provide education about signs/symptoms and action to take for hypo/hyperglycemia.;Provide education about proper nutrition, including hydration, and aerobic/resistive exercise prescription along with prescribed medications to achieve blood glucose in normal ranges: Fasting glucose 65-99 mg/dL    Expected Outcomes Short Term: Participant verbalizes understanding of the signs/symptoms and immediate care of hyper/hypoglycemia, proper foot care and importance of medication, aerobic/resistive exercise and nutrition plan for blood glucose control.;Long Term: Attainment of HbA1C < 7%.    Heart Failure Yes     Intervention Provide a combined exercise and nutrition program that is supplemented with education, support and counseling about heart failure. Directed toward relieving symptoms such as shortness of breath, decreased exercise tolerance, and extremity edema.    Expected Outcomes Improve functional capacity of life;Short term: Attendance in program 2-3 days a week with increased exercise capacity. Reported lower sodium intake. Reported increased fruit and vegetable intake. Reports medication compliance.;Short term: Daily weights obtained and reported for increase. Utilizing diuretic protocols set by physician.;Long term: Adoption of self-care skills and reduction of barriers for early signs and symptoms recognition and intervention leading to self-care maintenance.             Core Components/Risk Factors/Patient Goals Review:   Goals and Risk Factor Review     Row Name 06/24/22 1433 07/24/22 1432 08/19/22 1504 09/16/22 1448       Core Components/Risk Factors/Patient Goals Review   Personal Goals Review Weight Management/Obesity;Improve shortness of breath with ADL's;Develop more efficient breathing techniques such as purse lipped breathing and diaphragmatic breathing and practicing self-pacing with activity. Weight Management/Obesity;Improve shortness of breath with ADL's;Develop more efficient breathing techniques such as purse lipped breathing and diaphragmatic breathing and practicing self-pacing with activity. Weight Management/Obesity;Improve shortness of breath with ADL's;Develop more efficient breathing techniques such as purse lipped breathing and diaphragmatic breathing and practicing self-pacing with activity. Weight Management/Obesity;Improve shortness of breath with ADL's;Develop more efficient breathing techniques such as purse lipped breathing and diaphragmatic breathing and practicing self-pacing with activity.    Review Patient is new to the program completing 5 sessions.  He was  referred to PR with [facility]ob[facility] Emphyse[facility] by Dr. Verlee Monte.  His personal goals for the program are to lose weight and decrease his SOB nwith daily ADL's . Weight are averaging around 122.2kg.   We  will continue to monitor his progress as he works toward meeting these goals. Patient has completed 14 sessions.   His personal goals for the program are to lose weight and decrease his SOB with daily ADL's . Weight are averaging around 123.9kg.  He tolerated exericise well with O2sats averaging around 93%-95% without supplemental oxygen, only room air.  He tolerates using the Arm ergometer and the Nu Step.  We encourage purse lipped breathing technique .   We will continue to monitor as he works hard toward meeting thest goals. Patient has completed 21 sessions.   His personal goals for the program are to lose weight and decrease his SOB with daily ADL's . Weight are averaging up and down around 125.1kg.  He tolerated exericise well with O2sats averaging around 92%-95% without supplemental oxygen, only room air.  He tolerates using the Arm ergometer and the Nu Step.  We encourage purse lipped breathing technique .   We will continue to monitor as he works hard toward meeting thest goals. Patient has completed 25  sessions.   His personal goals for the program are to lose weight and decrease his SOB with daily ADL's . Weight are averaging up and down around 125.1kg.  He tolerated exericise well with O2sats averaging around 92%-97% without supplemental oxygen, only room air.  He tolerates using the Arm ergometer and the Nu Step.  We encourage purse lipped breathing technique .  He is currently out due to having hernia repair surgery on October 17th.  We will continue to monitor as he works hard toward meeting thest goals.    Expected Outcomes [place] will complete the program meeting both program and personal goals at discharge. [place] will complete the program meeting both program and personal goals at discharge.  [place] will complete the program meeting both program and personal goals at discharge. [place] will complete the program meeting both program and personal goals at discharge.             Core Components/Risk Factors/Patient Goals at Discharge (Final Review):   Goals and Risk Factor Review - 09/16/22 1448       Core Components/Risk Factors/Patient Goals Review   Personal Goals Review Weight Management/Obesity;Improve shortness of breath with ADL's;Develop more efficient breathing techniques such as purse lipped breathing and diaphragmatic breathing and practicing self-pacing with activity.    Review Patient has completed 25  sessions.   His personal goals for the program are to lose weight and decrease his SOB with daily ADL's . Weight are averaging up and down around 125.1kg.  He tolerated exericise well with O2sats averaging around 92%-97% without supplemental oxygen, only room air.  He tolerates using the Arm ergometer and the Nu Step.  We encourage purse lipped breathing technique .  He is currently out due to having hernia repair surgery on October 17th.  We will continue to monitor as he works hard toward meeting thest goals.    Expected Outcomes Patilent will complete the program meeting both program and personal goals at discharge.             ITP Comments:   Comments: ITP REVIEW Pt is making expected progress toward pulmonary rehab goals after completing 26 sessions. Recommend continued exercise, life style modification, education, and utilization of breathing techniques to increase stamina and strength and decrease shortness of breath with exertion.

## 2022-09-26 ENCOUNTER — Encounter (HOSPITAL_COMMUNITY): Payer: Medicare HMO

## 2022-10-01 ENCOUNTER — Encounter (HOSPITAL_COMMUNITY): Payer: Medicare HMO

## 2022-10-01 NOTE — Progress Notes (Signed)
Discharge Progress Report  Patient Details  Name: Vincent Cooper MRN: [id number] Date of Birth: 09/09/1941 Referring Provider:   Flowsheet Row PULMONARY REHAB OTHER RESP ORIENTATION from 06/03/2022 in [place]  Referring Provider Dr. Verlee Monte        Number of Visits: 26  Reason for Discharge:  Early Exit:  Hernia repair   Smoking History:  Social History   Tobacco Use  Smoking Status Former   Packs/day: 2.00   Years: 55.00   Total pack years: 110.00   Types: Cigarettes   Quit date: 11/18/2001   Years since quitting: 20.8  Smokeless Tobacco Former    Diagnosis:  Centrilobular emphysema ([place])  ADL [facility]:  Pulmonary Assessment Scores     Row Name 06/03/22 1243         ADL [facility]   ADL Phase Entry     SOB Score total 50     Rest 0     Walk 2     Stairs 4     Bath 3     Dress 3     Shop 2       CAT Score   CAT Score 8       mMRC Score   mMRC Score 4              Initial Exercise Prescription:  Initial Exercise Prescription - 06/03/22 1300       Date of Initial Exercise RX and Referring Provider   Date 06/03/22    Referring Provider Dr. Verlee Monte    Expected Discharge Date 10/08/22      NuStep   Level 1    SPM 60    Minutes 17      Arm Ergometer   Level 1    RPM 30    Minutes 22      Prescription Details   Frequency (times per week) 2    Duration Progress to 30 minutes of continuous aerobic without signs/symptoms of physical distress      Intensity   THRR 40-80% of Max Heartrate 56-112    Ratings of Perceived Exertion 11-13    Perceived Dyspnea 0-4      Resistance Training   Training Prescription Yes    Weight 3    Reps 10-15             Discharge Exercise Prescription (Final Exercise Prescription Changes):  Exercise Prescription Changes - 08/27/22 1400       Response to Exercise   Blood Pressure (Admit) 108/68    Blood Pressure (Exercise) 124/70    Blood Pressure (Exit) 128/70    Heart Rate (Admit) 84  bpm    Heart Rate (Exercise) 93 bpm    Heart Rate (Exit) 84 bpm    Oxygen Saturation (Admit) 93 %    Oxygen Saturation (Exercise) 93 %    Oxygen Saturation (Exit) 95 %    Rating of Perceived Exertion (Exercise) 12    Perceived Dyspnea (Exercise) 12    Duration Continue with 30 min of aerobic exercise without signs/symptoms of physical distress.    Intensity THRR unchanged      Progression   Progression Continue to progress workloads to maintain intensity without signs/symptoms of physical distress.      Resistance Training   Training Prescription Yes    Weight 4    Reps 10-15    Time 10 Minutes      NuStep   Level 3    SPM 83  Minutes 22    METs 2      Arm Ergometer   Level 3.4    RPM 43    Minutes 17    METs 1.9             Functional Capacity:  6 Minute Walk     Row Name 06/03/22 1304         6 Minute Walk   Phase Initial     Distance 600 feet     Walk Time 6 minutes     # of Rest Breaks 1     MPH 1.13     METS 0.56     RPE 12     Perceived Dyspnea  11     VO2 Peak 1.99     Symptoms Yes (comment)     Comments One sitting break for 1 minute due to pt feeling weak and tired     Resting HR 85 bpm     Resting BP 112/76     Resting Oxygen Saturation  95 %     Exercise Oxygen Saturation  during 6 min walk 93 %     Max Ex. HR 126 bpm     Max Ex. BP 120/70     2 Minute Post BP 110/68       Interval HR   1 Minute HR 124     2 Minute HR 124     3 Minute HR 120     4 Minute HR 128     5 Minute HR 120     6 Minute HR 126     2 Minute Post HR 97     Interval Heart Rate? Yes       Interval Oxygen   Interval Oxygen? Yes     Baseline Oxygen Saturation % 95 %     1 Minute Oxygen Saturation % 92 %     1 Minute Liters of Oxygen 0 L     2 Minute Oxygen Saturation % 93 %     2 Minute Liters of Oxygen 0 L     3 Minute Oxygen Saturation % 94 %     3 Minute Liters of Oxygen 0 L     4 Minute Oxygen Saturation % 95 %     4 Minute Liters of Oxygen 0 L      5 Minute Oxygen Saturation % 94 %     5 Minute Liters of Oxygen 0 L     6 Minute Oxygen Saturation % 93 %     6 Minute Liters of Oxygen 0 L     2 Minute Post Oxygen Saturation % 96 %     2 Minute Post Liters of Oxygen 0 L              Psychological, QOL, Others - Outcomes: PHQ 2/9:    06/03/2022    1:04 PM  Depression screen PHQ 2/9  Decreased Interest 0  Down, Depressed, Hopeless 0  PHQ - 2 Score 0  Altered sleeping 0  Tired, decreased energy 2  Change in appetite 0  Feeling bad or failure about yourself  0  Trouble concentrating 0  Moving slowly or fidgety/restless 0  Suicidal thoughts 0  PHQ-9 Score 2  Difficult doing work/chores Not difficult at all    Quality of Life:  Quality of Life - 06/03/22 1309       Quality of Life   Select Quality of Life  Quality of Life Scores   Health/Function Pre 25.72 %    Socioeconomic Pre 30 %    Psych/Spiritual Pre 30 %    Family Pre 30 %    GLOBAL Pre 27.92 %             Personal Goals: Goals established at orientation with interventions provided to work toward goal.  Personal Goals and Risk Factors at Admission - 06/03/22 1311       Core Components/Risk Factors/Patient Goals on Admission    Weight Management Yes;Obesity;Weight Loss    Intervention Obesity: Provide education and appropriate resources to help participant work on and attain dietary goals.;Weight Management/Obesity: Establish reasonable short term and long term weight goals.;Weight Management: Provide education and appropriate resources to help participant work on and attain dietary goals.;Weight Management: Develop a combined nutrition and exercise program designed to reach desired caloric intake, while maintaining appropriate intake of nutrient and fiber, sodium and fats, and appropriate energy expenditure required for the weight goal.    Expected Outcomes Short Term: Continue to assess and modify interventions until short term weight is  achieved;Long Term: Adherence to nutrition and physical activity/exercise program aimed toward attainment of established weight goal;Weight Maintenance: Understanding of the daily nutrition guidelines, which includes 25-35% calories from fat, 7% or less cal from saturated fats, less than '200mg'$  cholesterol, less than 1.5gm of sodium, & 5 or more servings of fruits and vegetables daily;Understanding recommendations for meals to include 15-35% energy as protein, 25-35% energy from fat, 35-60% energy from carbohydrates, less than '200mg'$  of dietary cholesterol, 20-35 gm of total fiber daily;Understanding of distribution of calorie intake throughout the day with the consumption of 4-5 meals/snacks;Weight Gain: Understanding of general recommendations for a high calorie, high protein meal plan that promotes weight gain by distributing calorie intake throughout the day with the consumption for 4-5 meals, snacks, and/or supplements    Improve shortness of breath with ADL's Yes    Intervention Provide education, individualized exercise plan and daily activity instruction to help decrease symptoms of SOB with activities of daily living.    Expected Outcomes Short Term: Improve cardiorespiratory fitness to achieve a reduction of symptoms when performing ADLs;Long Term: Be able to perform more ADLs without symptoms or delay the onset of symptoms    Diabetes Yes    Intervention Provide education about signs/symptoms and action to take for hypo/hyperglycemia.;Provide education about proper nutrition, including hydration, and aerobic/resistive exercise prescription along with prescribed medications to achieve blood glucose in normal ranges: Fasting glucose 65-99 mg/dL    Expected Outcomes Short Term: Participant verbalizes understanding of the signs/symptoms and immediate care of hyper/hypoglycemia, proper foot care and importance of medication, aerobic/resistive exercise and nutrition plan for blood glucose control.;Long  Term: Attainment of HbA1C < 7%.    Heart Failure Yes    Intervention Provide a combined exercise and nutrition program that is supplemented with education, support and counseling about heart failure. Directed toward relieving symptoms such as shortness of breath, decreased exercise tolerance, and extremity edema.    Expected Outcomes Improve functional capacity of life;Short term: Attendance in program 2-3 days a week with increased exercise capacity. Reported lower sodium intake. Reported increased fruit and vegetable intake. Reports medication compliance.;Short term: Daily weights obtained and reported for increase. Utilizing diuretic protocols set by physician.;Long term: Adoption of self-care skills and reduction of barriers for early signs and symptoms recognition and intervention leading to self-care maintenance.  Personal Goals Discharge:  Goals and Risk Factor Review     Row Name 06/24/22 1433 07/24/22 1432 08/19/22 1504 09/16/22 1448       Core Components/Risk Factors/Patient Goals Review   Personal Goals Review Weight Management/Obesity;Improve shortness of breath with ADL's;Develop more efficient breathing techniques such as purse lipped breathing and diaphragmatic breathing and practicing self-pacing with activity. Weight Management/Obesity;Improve shortness of breath with ADL's;Develop more efficient breathing techniques such as purse lipped breathing and diaphragmatic breathing and practicing self-pacing with activity. Weight Management/Obesity;Improve shortness of breath with ADL's;Develop more efficient breathing techniques such as purse lipped breathing and diaphragmatic breathing and practicing self-pacing with activity. Weight Management/Obesity;Improve shortness of breath with ADL's;Develop more efficient breathing techniques such as purse lipped breathing and diaphragmatic breathing and practicing self-pacing with activity.    Review Patient is new to the program  completing 5 sessions.  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  His personal goals for the program are to lose weight and decrease his SOB nwith daily ADL's . Weight are averaging around 122.2kg.   We will continue to monitor his progress as he works toward meeting these goals. Patient has completed 14 sessions.   His personal goals for the program are to lose weight and decrease his SOB with daily ADL's . Weight are averaging around 123.9kg.  He tolerated exericise well with O2sats averaging around 93%-95% without supplemental oxygen, only room air.  He tolerates using the Arm ergometer and the Nu Step.  We encourage purse lipped breathing technique .   We will continue to monitor as he works hard toward meeting thest goals. Patient has completed 21 sessions.   His personal goals for the program are to lose weight and decrease his SOB with daily ADL's . Weight are averaging up and down around 125.1kg.  He tolerated exericise well with O2sats averaging around 92%-95% without supplemental oxygen, only room air.  He tolerates using the Arm ergometer and the Nu Step.  We encourage purse lipped breathing technique .   We will continue to monitor as he works hard toward meeting thest goals. Patient has completed 25  sessions.   His personal goals for the program are to lose weight and decrease his SOB with daily ADL's . Weight are averaging up and down around 125.1kg.  He tolerated exericise well with O2sats averaging around 92%-97% without supplemental oxygen, only room air.  He tolerates using the Arm ergometer and the Nu Step.  We encourage purse lipped breathing technique .  He is currently out due to having hernia repair surgery on October 17th.  We will continue to monitor as he works hard toward meeting thest goals.    Expected Outcomes Patilent will complete the program meeting both program and personal goals at discharge. Pat[facility] will complete the program meeting both program and  personal goals at discharge. Patilent will complete the program meeting both program and personal goals at discharge. Pat[facility] will complete the program meeting both program and personal goals at discharge.             Exercise Goals and Review:  Exercise Goals     Row Name 07/02/22 1427 07/30/22 1431 08/27/22 1421         Exercise Goals   Increase Physical Activity Yes Yes Yes     Intervention Provide advice, education, support and counseling about physical activity/exercise needs.;Develop an individualized exercise prescription for aerobic and resistive training based on initial evaluation findings, risk stratification, comorbidities  and participant's personal goals. Provide advice, education, support and counseling about physical activity/exercise needs.;Develop an individualized exercise prescription for aerobic and resistive training based on initial evaluation findings, risk stratification, comorbidities and participant's personal goals. Provide advice, education, support and counseling about physical activity/exercise needs.;Develop an individualized exercise prescription for aerobic and resistive training based on initial evaluation findings, risk stratification, comorbidities and participant's personal goals.     Expected Outcomes Short Term: Attend rehab on a regular basis to increase amount of physical activity.;Long Term: Add in home exercise to make exercise part of routine and to increase amount of physical activity.;Long Term: Exercising regularly at least 3-5 days a week. Short Term: Attend rehab on a regular basis to increase amount of physical activity.;Long Term: Add in home exercise to make exercise part of routine and to increase amount of physical activity.;Long Term: Exercising regularly at least 3-5 days a week. Short Term: Attend rehab on a regular basis to increase amount of physical activity.;Long Term: Add in home exercise to make exercise part of routine and to increase  amount of physical activity.;Long Term: Exercising regularly at least 3-5 days a week.     Increase Strength and Stamina Yes Yes Yes     Intervention Develop an individualized exercise prescription for aerobic and resistive training based on initial evaluation findings, risk stratification, comorbidities and participant's personal goals.;Provide advice, education, support and counseling about physical activity/exercise needs. Develop an individualized exercise prescription for aerobic and resistive training based on initial evaluation findings, risk stratification, comorbidities and participant's personal goals.;Provide advice, education, support and counseling about physical activity/exercise needs. Develop an individualized exercise prescription for aerobic and resistive training based on initial evaluation findings, risk stratification, comorbidities and participant's personal goals.;Provide advice, education, support and counseling about physical activity/exercise needs.     Expected Outcomes Short Term: Increase workloads from initial exercise prescription for resistance, speed, and METs.;Short Term: Perform resistance training exercises routinely during rehab and add in resistance training at home;Long Term: Improve cardiorespiratory fitness, muscular endurance and strength as measured by increased METs and functional capacity (6MWT) Short Term: Increase workloads from initial exercise prescription for resistance, speed, and METs.;Short Term: Perform resistance training exercises routinely during rehab and add in resistance training at home;Long Term: Improve cardiorespiratory fitness, muscular endurance and strength as measured by increased METs and functional capacity (6MWT) Short Term: Increase workloads from initial exercise prescription for resistance, speed, and METs.;Short Term: Perform resistance training exercises routinely during rehab and add in resistance training at home;Long Term: Improve  cardiorespiratory fitness, muscular endurance and strength as measured by increased METs and functional capacity (6MWT)     Able to understand and use rate of perceived exertion (RPE) scale Yes Yes Yes     Intervention Provide education and explanation on how to use RPE scale Provide education and explanation on how to use RPE scale Provide education and explanation on how to use RPE scale     Expected Outcomes Short Term: Able to use RPE daily in rehab to express subjective intensity level;Long Term:  Able to use RPE to guide intensity level when exercising independently Short Term: Able to use RPE daily in rehab to express subjective intensity level;Long Term:  Able to use RPE to guide intensity level when exercising independently Short Term: Able to use RPE daily in rehab to express subjective intensity level;Long Term:  Able to use RPE to guide intensity level when exercising independently     Able to understand and use Dyspnea scale Yes Yes  Yes     Intervention Provide education and explanation on how to use Dyspnea scale Provide education and explanation on how to use Dyspnea scale Provide education and explanation on how to use Dyspnea scale     Expected Outcomes Short Term: Able to use Dyspnea scale daily in rehab to express subjective sense of shortness of breath during exertion;Long Term: Able to use Dyspnea scale to guide intensity level when exercising independently Short Term: Able to use Dyspnea scale daily in rehab to express subjective sense of shortness of breath during exertion;Long Term: Able to use Dyspnea scale to guide intensity level when exercising independently Short Term: Able to use Dyspnea scale daily in rehab to express subjective sense of shortness of breath during exertion;Long Term: Able to use Dyspnea scale to guide intensity level when exercising independently     Knowledge and understanding of Target Heart Rate Range (THRR) Yes Yes Yes     Intervention Provide education and  explanation of THRR including how the numbers were predicted and where they are located for reference Provide education and explanation of THRR including how the numbers were predicted and where they are located for reference Provide education and explanation of THRR including how the numbers were predicted and where they are located for reference     Expected Outcomes Short Term: Able to state/look up THRR;Long Term: Able to use THRR to govern intensity when exercising independently;Short Term: Able to use daily as guideline for intensity in rehab Short Term: Able to state/look up THRR;Long Term: Able to use THRR to govern intensity when exercising independently;Short Term: Able to use daily as guideline for intensity in rehab Short Term: Able to state/look up THRR;Long Term: Able to use THRR to govern intensity when exercising independently;Short Term: Able to use daily as guideline for intensity in rehab     Understanding of Exercise Prescription Yes Yes Yes     Intervention Provide education, explanation, and written materials on patient's individual exercise prescription Provide education, explanation, and written materials on patient's individual exercise prescription Provide education, explanation, and written materials on patient's individual exercise prescription     Expected Outcomes Short Term: Able to explain program exercise prescription;Long Term: Able to explain home exercise prescription to exercise independently Short Term: Able to explain program exercise prescription;Long Term: Able to explain home exercise prescription to exercise independently Short Term: Able to explain program exercise prescription;Long Term: Able to explain home exercise prescription to exercise independently              Exercise Goals Re-Evaluation:  Exercise Goals Re-Evaluation     Row Name 07/02/22 1427 07/30/22 1432 08/27/22 1421         Exercise Goal Re-Evaluation   Exercise Goals Review Increase  Physical Activity;Increase Strength and Stamina;Able to understand and use Dyspnea scale;Able to understand and use rate of perceived exertion (RPE) scale;Knowledge and understanding of Target Heart Rate Range (THRR);Understanding of Exercise Prescription Increase Physical Activity;Increase Strength and Stamina;Able to understand and use rate of perceived exertion (RPE) scale;Able to understand and use Dyspnea scale;Knowledge and understanding of Target Heart Rate Range (THRR);Understanding of Exercise Prescription Increase Strength and Stamina;Increase Physical Activity;Able to understand and use rate of perceived exertion (RPE) scale;Able to understand and use Dyspnea scale;Knowledge and understanding of Target Heart Rate Range (THRR);Understanding of Exercise Prescription     Comments Pt has completed 8 sessions of PR. He comes to class and is motivated. He has started to increase his workload on both sets of  equipment. He is currently exercising at 1.8 METs on the stepper. Will continue to montior and progress as able. Pt has completed 16 sessions of PR. He is eagar to come to class and is motivated to start. He is currently exercising 1.9 METs on the stepper. Will cotinue to monitor and progress as able. Pt has completed 24 sessions of PR. He is motivated during class and has increased his worklaod. He is currently exercising at 2.0 METs on the stepper. Will continue to monitor and progress as able.     Expected Outcomes Through exercise at home and at rehab, the patient will meet their stated goals. Through exercise at home and at rehab, the patient will meet their stated goals. Through exercise at home and at rehab, the patient will meet their stated goals.              Nutrition & Weight - Outcomes:  Pre Biometrics - 06/03/22 1308       Pre Biometrics   Height '5\' 9"'$  (1.753 m)    Weight 268 lb 15.4 oz (122 kg)    Waist Circumference 55 inches    Hip Circumference 56 inches    Waist to Hip  Ratio 0.98 %    BMI (Calculated) 39.7    Triceps Skinfold 25 mm    % Body Fat 41.6 %    Grip Strength 23.3 kg    Flexibility 0 in    Single Leg Stand 0 seconds              Nutrition:  Nutrition Therapy & Goals - 06/24/22 1444       Personal Nutrition Goals   Comments Patient scored  21 on his diet assessment.  We offer 2 educational sessions on heart healthy nutrition with hanhouts and offer assistance with RD  referral if patient is interested.      Intervention Plan   Intervention Nutrition handout(s) given to patient.    Expected Outcomes Short Term Goal: Understand basic principles of dietary content, such as calories, fat, sodium, cholesterol and nutrients.             Nutrition Discharge:  Nutrition Assessments - 06/03/22 1245       MEDFICTS Scores   Pre Score 21             Education Questionnaire Score:  Knowledge Questionnaire Score - 06/03/22 1245       Knowledge Questionnaire Score   Pre Score 10/18             Pt discharged from PR after 26 sessions. He had hernia repair surgery on 10/17, and he has still not been cleared to come back as of 11/14. I have tried to call pt several times, but he has not answered and his VM is full.

## 2022-10-01 NOTE — Progress Notes (Unsigned)
onar  Synopsis: Referred for COPD by Manon Hilding, MD  Subjective:   PATIENT ID: Vincent Cooper GENDER: male DOB: 05-11-1941, MRN: [id number]  No chief complaint on file.  81yM with history of COPD on trelegy 200 and systemic steroids and used to see pulmonologist in [place], CAD, DM2, OSA on CPAP, HTN, PE 10/2019 - he is unsure if he still taking eliquis due to this PE  He has DOE to 100 feet. Worsening gradually overall but can have episodic worsening. He does have a productive cough typically in the morning. No CP. Has been on trelegy for a couple years.   He has been on systemic steroids for 2 months. Down to 10 mg daily.   He has never tried pulmonary rehab before  Not smoking currently, quit 20 ya. 50 py+ smoking.   Uses CPAP every night. But does have trouble breathing if he lies down flat. Probably excessively sleepy during the day.   Never had asthma as a kid, does frequently have sinus congestion but no history of sinus surgeries.   He father had emphysema  He worked in home improvement. He has lived in [place] and [place]. No MJ or vaping.   Interval HPI: Off prednisone, had ventral hernia repair 10/17, has f/u later this month with Dr. Constance Haw  Otherwise pertinent review of systems is negative.  Past Medical History:  Diagnosis Date   Chronic gout    COPD (chronic obstructive pulmonary disease) ([place])    Oxygen dependent   Coronary artery disease due to calcified coronary lesion 03/08/2020   CORONARY CTA: Cor Ca++ Score 1651 !!: ~ 50% LM (CTFFR 1 - NS). Large Dom RCA-<PDA-PAV/PL - diffuse mild-mod plaque: prox (25-49%), mid (50-69%) CTFFR (p 0.99, m 0.85, d 0.81 - NS).  Med-Size LAD - prox-mid long diffuse Mod-Severe Ca++ plaque (~50-69%, ?>70%): CTFFR p 0.95, m 0.88, d 0.86 -NS.  CTFFR LCx 0.94, OM1 0.90 - NS. (NS=Not Significant).   Diabetes mellitus type II, non insulin dependent (HCC)    Hypertension    Morbid obesity (HCC)    BMI of 38.5 with multiple risk factors.    OSA on CPAP    Pulmonary emboli ([place]) 10/2019   Chest CTA-4 [place] opacification of main PA but there is partially occlusive main posterior RLL and segmental/segmental branches.  Small thrombus noted in the anterior right middle lobe.  No RV strain.  Scattered aortic atherosclerosis involving great vessels.  Coronary calcification noted.   RLS (restless legs syndrome)      Family History  Problem Relation Age of Onset   Colon cancer Mother    Emphysema Father        smoked   Diverticulitis Sister    Diabetes Brother    Diabetes Brother    Heart Problems Maternal Grandmother    Breast cancer Daughter    Lupus Son    CAD Neg Hx      Past Surgical History:  Procedure Laterality Date   BOWEL RESECTION N/A 10/18/2020   Procedure: SMALL BOWEL RESECTION;  Surgeon: Virl Cagey, MD;  Location: [facility];  Service: General;  Laterality: N/A;   Cataract surgery Right    COLONOSCOPY WITH PROPOFOL N/A 09/27/2020   Procedure: COLONOSCOPY WITH PROPOFOL;  Surgeon: Rogene Houston, MD;  Location: [facility];  Service: Endoscopy;  Laterality: N/A;  1:15   COLOSTOMY REVERSAL N/A 10/18/2020   Procedure: COLOSTOMY REVERSAL;  Surgeon: Virl Cagey, MD;  Location: [facility];  Service: General;  Laterality: N/A;   LAPAROTOMY N/A 06/21/2020   Procedure: EXPLORATORY LAPAROTOMY,  bowel resection, creation ostomy;  Surgeon: Virl Cagey, MD;  Location: [facility];  Service: General;  Laterality: N/A;   POLYPECTOMY  09/27/2020   Procedure: POLYPECTOMY;  Surgeon: Rogene Houston, MD;  Location: [facility];  Service: Endoscopy;;   TRANSTHORACIC ECHOCARDIOGRAM  10/20/2020   EF 60 to 65%.  Moderate LVH at least GR 1 DD.  Mild to moderate LA dilation.  Moderate aortic valve calcification-sclerosis with no stenosis.  Normal RAP.   VENTRAL HERNIA REPAIR N/A 09/02/2022   Procedure: HERNIA REPAIR VENTRAL ADULT WITH  MESH;  Surgeon: Virl Cagey, MD;  Location: [facility];  Service: General;  Laterality:  N/A;    Social History   Socioeconomic History   Marital status: Legally Separated    Spouse name: Not on file   Number of children: 3   Years of education: Not on file   Highest education level: Not on file  Occupational History   Occupation: retired  Tobacco Use   Smoking status: Former    Packs/day: 2.00    Years: 55.00    Total pack years: 110.00    Types: Cigarettes    Quit date: 11/18/2001    Years since quitting: 20.8   Smokeless tobacco: Former  Scientific laboratory technician Use: Never used  Substance and Sexual Activity   Alcohol use: Not Currently   Drug use: Never   Sexual activity: Not Currently  Other Topics Concern   Not on file  Social History Narrative   Not on file   Social Determinants of Health   Financial Resource Strain: Not on file  Food Insecurity: No Food Insecurity (09/02/2022)   Hunger Vital Sign    Worried About Running Out of Food in the Last Year: Never true    [place] in the Last Year: Never true  Transportation Needs: No Transportation Needs (09/02/2022)   PRAPARE - Hydrologist (Medical): No    Lack of Transportation (Non-Medical): No  Physical Activity: Not on file  Stress: Not on file  Social Connections: Not on file  Intimate Partner Violence: Not At Risk (09/02/2022)   Humiliation, Afraid, Rape, and Kick questionnaire    Fear of Current or Ex-Partner: No    Emotionally Abused: No    Physically Abused: No    Sexually Abused: No     Allergies  Allergen Reactions   Ancef [Cefazolin] Itching and Rash   Other     Patient reports he was allergic to something in an IV he was given but does not know what the substance was. As of 01/19/2020      Outpatient Medications Prior to Visit  Medication Sig Dispense Refill   albuterol (VENTOLIN HFA) 108 (90 Base) MCG/ACT inhaler Inhale 1 puff into the lungs every 6 (six) hours as needed for wheezing or shortness of breath.     apixaban (ELIQUIS) 2.5 MG TABS  tablet Take 1 tablet (2.5 mg total) by mouth 2 (two) times daily. 60 tablet    Budeson-Glycopyrrol-Formoterol (BREZTRI AEROSPHERE) 160-9-4.8 MCG/ACT AERO Inhale 2 puffs into the lungs in the morning and at bedtime. 10.7 g 11   diltiazem (CARDIZEM CD) 180 MG 24 hr capsule Take 360 mg by mouth every morning.     docusate sodium (COLACE) 100 MG capsule Take 100 mg by mouth daily as needed for moderate constipation.     hydroxypropyl methylcellulose /  hypromellose (ISOPTO TEARS / GONIOVISC) 2.5 % ophthalmic solution Place 1 drop into both eyes as needed for dry eyes.     ipratropium-albuterol (DUONEB) 0.5-2.5 (3) MG/3ML SOLN Take 3 mLs by nebulization every 6 (six) hours as needed (shortness of breath).      Mepolizumab (NUCALA) 100 MG/ML SOAJ Inject 1 mL (100 mg total) into the skin every 28 (twenty-eight) days. 3 mL 1   metoprolol tartrate (LOPRESSOR) 25 MG tablet Take 1 tablet (25 mg total) by mouth 2 (two) times daily. 60 tablet 3   nitroGLYCERIN (NITROSTAT) 0.4 MG SL tablet Place 0.4 mg under the tongue every 5 (five) minutes as needed for chest pain.     NON FORMULARY Pt uses a cpap nightly     ondansetron (ZOFRAN-ODT) 4 MG disintegrating tablet Take 1 tablet (4 mg total) by mouth every 6 (six) hours as needed for nausea. 20 tablet 0   oxyCODONE (OXY IR/ROXICODONE) 5 MG immediate release tablet Take 1 tablet (5 mg total) by mouth every 4 (four) hours as needed for severe pain or breakthrough pain. 20 tablet 0   potassium chloride SA (KLOR-CON) 20 MEQ tablet Take 20 mEq by mouth daily.     rosuvastatin (CRESTOR) 40 MG tablet TAKE 1 TABLET BY MOUTH ONCE DAILY. 90 tablet 3   Spacer/Aero-Holding Chambers (AEROCHAMBER MV) inhaler Use as instructed 1 each 0   tamsulosin (FLOMAX) 0.4 MG CAPS capsule Take 1 capsule (0.4 mg total) by mouth daily. (Patient taking differently: Take 0.4 mg by mouth at bedtime.) 30 capsule 1   torsemide (DEMADEX) 20 MG tablet Take 1 tablet (20 mg total) by mouth 2 (two) times  daily. May take an extra 20 mg if needed for increase swelling (Patient taking differently: Take 20-40 mg by mouth See admin instructions. 40 mg in the morning, 20 mg in the evening) 200 tablet 3   No facility-administered medications prior to visit.       Objective:   Physical Exam:  General appearance: 81 y.o., male, NAD, conversant  Eyes: anicteric sclerae; PERRL, tracking appropriately HENT: NCAT; MMM Neck: Trachea midline; no lymphadenopathy, no JVD Lungs: good air movement bilaterally!, with normal work of breathing CV: RRR, no murmur  Abdomen: Soft, non-tender; non-distended, BS present  Extremities: 1+ pitting BLE edema, warm Skin: Normal turgor and texture; no rash Psych: Appropriate affect Neuro: Alert and oriented to person and place, no focal deficit     There were no vitals filed for this visit.      on RA BMI Readings from Last 3 Encounters:  09/02/22 40.30 kg/m  08/28/22 43.77 kg/m  08/27/22 43.77 kg/m   Wt Readings from Last 3 Encounters:  09/02/22 272 lb 14.4 oz (123.8 kg)  08/28/22 271 lb 2.7 oz (123 kg)  08/27/22 271 lb 2.7 oz (123 kg)     CBC    Component Value Date/Time   WBC 12.4 (H) 09/03/2022 0540   RBC 5.29 09/03/2022 0540   HGB 16.8 09/03/2022 0540   HCT 50.0 09/03/2022 0540   PLT 209 09/03/2022 0540   MCV 94.5 09/03/2022 0540   MCH 31.8 09/03/2022 0540   MCHC 33.6 09/03/2022 0540   RDW 14.6 09/03/2022 0540   LYMPHSABS 1.1 06/15/2022 0725   MONOABS 0.9 06/15/2022 0725   EOSABS 0.1 06/15/2022 0725   BASOSABS 0.0 06/15/2022 0725    Remarkable eosinophilia historically  Chest Imaging: CTA Chest 2020 with a little upper lobe emphysema and bronchial wall thickening  CT A/P 06/15/22 lung  bases with a few foci of scar otherwise unremarkable  Pulmonary Functions Testing Results:    Latest Ref Rng & Units 06/07/2022   12:49 PM  PFT Results  FVC-Pre L 2.61   FVC-Predicted Pre % 77   FVC-Post L 2.60   FVC-Predicted Post % 76    Pre FEV1/FVC % % 67   Post FEV1/FCV % % 67   FEV1-Pre L 1.75   FEV1-Predicted Pre % 73   FEV1-Post L 1.73   DLCO uncorrected ml/min/mmHg 16.34   DLCO UNC% % 76   DLCO corrected ml/min/mmHg 16.34   DLCO COR %Predicted % 76   DLVA Predicted % 87   TLC L 5.60   TLC % Predicted % 89   RV % Predicted % 116      Echocardiogram:   TTE 10/2020:  1. Left ventricular ejection fraction, by estimation, is 60 to 65%. The  left ventricle has normal function. The left ventricle has no regional  wall motion abnormalities. There is moderate left ventricular hypertrophy.  Left ventricular diastolic  parameters are indeterminate.   2. Right ventricular systolic function is normal. The right ventricular  size is normal.   3. Left atrial size was mild to moderately dilated.   4. The mitral valve is normal in structure. No evidence of mitral valve  regurgitation. No evidence of mitral stenosis.   5. The aortic valve has an indeterminant number of cusps. There is  moderate calcification of the aortic valve. There is moderate thickening  of the aortic valve. Aortic valve regurgitation is not visualized. No  aortic stenosis is present.   6. The inferior vena cava is normal in size with greater than 50%  respiratory variability, suggesting right atrial pressure of 3 mmHg.      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 Excellent response to nucala, has weaned off of steroids.  # OSA on CPAP [facility] is DME supplier  Plan: - continue nucala  - stay on breztri 2 puffs twice daily, rinse mouth/brush tongue after each use - albuterol as needed  - if feeling watery eyes, sinus congestion despite above measures, consider over the counter xyzal  - request download of CPAP from [facility] next visit - pre-op clearance letter written and sent to Dr. Constance Haw and given to pt - see  you in 3 months or sooner if need be!    Maryjane Hurter, MD [facility] 10/01/2022 4:57 PM

## 2022-10-02 ENCOUNTER — Ambulatory Visit (INDEPENDENT_AMBULATORY_CARE_PROVIDER_SITE_OTHER): Payer: Medicare HMO | Admitting: Student

## 2022-10-02 ENCOUNTER — Encounter: Payer: Self-pay | Admitting: Student

## 2022-10-02 VITALS — BP 134/64 | HR 98 | Ht 69.0 in | Wt 277.4 lb

## 2022-10-02 DIAGNOSIS — J4489 Other specified chronic obstructive pulmonary disease: Secondary | ICD-10-CM

## 2022-10-02 MED ORDER — BREZTRI AEROSPHERE 160-9-4.8 MCG/ACT IN AERO: 2.0000 | INHALATION_SPRAY | Freq: Two times a day (BID) | RESPIRATORY_TRACT | 0 refills | Status: DC

## 2022-10-02 NOTE — Patient Instructions (Signed)
-   continue nucala injection  - stay on breztri 2 puffs twice daily, rinse mouth/brush tongue after each use - albuterol as needed  - if feeling watery eyes, sinus congestion despite above measures, consider over the counter xyzal  - see you in 3 months or sooner if need be! 

## 2022-10-02 NOTE — Addendum Note (Signed)
Addended by: Loma Sousa on: 10/02/2022 03:05 PM   Modules accepted: Orders

## 2022-10-03 ENCOUNTER — Encounter (HOSPITAL_COMMUNITY): Payer: Medicare HMO

## 2022-10-08 ENCOUNTER — Encounter (HOSPITAL_COMMUNITY): Payer: Medicare HMO

## 2022-10-14 ENCOUNTER — Other Ambulatory Visit (HOSPITAL_COMMUNITY): Payer: Self-pay

## 2022-10-16 ENCOUNTER — Other Ambulatory Visit (HOSPITAL_COMMUNITY): Payer: Self-pay

## 2022-10-17 ENCOUNTER — Ambulatory Visit (INDEPENDENT_AMBULATORY_CARE_PROVIDER_SITE_OTHER): Payer: Medicare HMO | Admitting: General Surgery

## 2022-10-17 ENCOUNTER — Encounter: Payer: Self-pay | Admitting: General Surgery

## 2022-10-17 VITALS — BP 121/59 | HR 72 | Temp 98.1°F | Resp 14 | Ht 69.0 in | Wt 274.0 lb

## 2022-10-17 DIAGNOSIS — K439 Ventral hernia without obstruction or gangrene: Secondary | ICD-10-CM

## 2022-10-17 NOTE — Progress Notes (Signed)
[facility]  Doing well. No issues. No pain.  BP (!) 121/59   Pulse 72   Temp 98.1 F (36.7 C) (Oral)   Resp 14   Ht '5\' 9"'$  (1.753 m)   Wt 274 lb (124.3 kg)   SpO2 94%   BMI 40.46 kg/m  Incision healed, skin with some minor dry area, minor induration, no hernia    Patient s/p incisional hernia repair with mesh. Doing well. Overall feeling good.  No heavy lifting > 10 lbs, excessive bending, pushing, pulling, or squatting for 6-8 weeks after surgery.    [name], MD [facility] [address] [place], [place] [address] [phone] (office)

## 2022-10-17 NOTE — Patient Instructions (Signed)
No heavy lifting > 10 lbs, excessive bending, pushing, pulling, or squatting for 6-8 weeks after surgery.

## 2022-11-01 ENCOUNTER — Telehealth: Payer: Self-pay | Admitting: Pharmacist

## 2022-11-01 NOTE — Telephone Encounter (Signed)
Per Rinaldo Ratel, [place] for [place] set to expire Submitted a Prior Authorization RENEWAL request to [facility] for [place] via CoverMyMeds. Will update once we receive a response.  Key: [id number]  Knox Saliva, PharmD, MPH, BCPS, CPP Clinical Pharmacist (Rheumatology and Pulmonology)

## 2022-11-03 NOTE — Telephone Encounter (Signed)
Received notification from [facility] regarding a prior authorization for [place]. Authorization has been APPROVED from 11/18/2021  to 11/18/2023.  Patient can continue to fill through [facility]: [phone]   Authorization # [id number] Phone # [phone]  [employer] updated  Knox Saliva, PharmD, MPH, BCPS, CPP Clinical Pharmacist (Rheumatology and Pulmonology)

## 2022-11-05 ENCOUNTER — Other Ambulatory Visit (HOSPITAL_COMMUNITY): Payer: Self-pay

## 2022-11-05 ENCOUNTER — Other Ambulatory Visit: Payer: Self-pay | Admitting: Student

## 2022-11-05 DIAGNOSIS — J4489 Other specified chronic obstructive pulmonary disease: Secondary | ICD-10-CM

## 2022-11-06 ENCOUNTER — Other Ambulatory Visit (HOSPITAL_COMMUNITY): Payer: Self-pay

## 2022-11-06 MED ORDER — NUCALA 100 MG/ML ~~LOC~~ SOAJ
100.0000 mg | SUBCUTANEOUS | 1 refills | Status: DC
  Filled 2022-11-06 – 2022-11-13 (×2): qty 1, 28d supply, fill #0
  Filled 2022-12-09: qty 1, 28d supply, fill #1
  Filled 2022-12-31: qty 1, 28d supply, fill #2
  Filled 2023-01-28: qty 1, 28d supply, fill #3
  Filled 2023-02-24: qty 1, 28d supply, fill #4
  Filled 2023-03-25 – 2023-04-01 (×2): qty 1, 28d supply, fill #5

## 2022-11-13 ENCOUNTER — Other Ambulatory Visit (HOSPITAL_COMMUNITY): Payer: Self-pay

## 2022-12-05 ENCOUNTER — Other Ambulatory Visit (HOSPITAL_COMMUNITY): Payer: Self-pay

## 2022-12-09 ENCOUNTER — Other Ambulatory Visit (HOSPITAL_COMMUNITY): Payer: Self-pay

## 2022-12-10 ENCOUNTER — Other Ambulatory Visit: Payer: Self-pay

## 2022-12-18 DIAGNOSIS — I10 Essential (primary) hypertension: Secondary | ICD-10-CM | POA: Diagnosis not present

## 2022-12-18 DIAGNOSIS — N183 Chronic kidney disease, stage 3 unspecified: Secondary | ICD-10-CM | POA: Diagnosis not present

## 2022-12-18 DIAGNOSIS — E782 Mixed hyperlipidemia: Secondary | ICD-10-CM | POA: Diagnosis not present

## 2022-12-18 DIAGNOSIS — E7849 Other hyperlipidemia: Secondary | ICD-10-CM | POA: Diagnosis not present

## 2022-12-18 DIAGNOSIS — E1122 Type 2 diabetes mellitus with diabetic chronic kidney disease: Secondary | ICD-10-CM | POA: Diagnosis not present

## 2022-12-25 DIAGNOSIS — I4821 Permanent atrial fibrillation: Secondary | ICD-10-CM | POA: Diagnosis not present

## 2022-12-25 DIAGNOSIS — R609 Edema, unspecified: Secondary | ICD-10-CM | POA: Diagnosis not present

## 2022-12-25 DIAGNOSIS — E1122 Type 2 diabetes mellitus with diabetic chronic kidney disease: Secondary | ICD-10-CM | POA: Diagnosis not present

## 2022-12-25 DIAGNOSIS — Z86711 Personal history of pulmonary embolism: Secondary | ICD-10-CM | POA: Diagnosis not present

## 2022-12-25 DIAGNOSIS — N138 Other obstructive and reflux uropathy: Secondary | ICD-10-CM | POA: Diagnosis not present

## 2022-12-25 DIAGNOSIS — K5790 Diverticulosis of intestine, part unspecified, without perforation or abscess without bleeding: Secondary | ICD-10-CM | POA: Diagnosis not present

## 2022-12-25 DIAGNOSIS — E7849 Other hyperlipidemia: Secondary | ICD-10-CM | POA: Diagnosis not present

## 2022-12-25 DIAGNOSIS — M1710 Unilateral primary osteoarthritis, unspecified knee: Secondary | ICD-10-CM | POA: Diagnosis not present

## 2022-12-25 DIAGNOSIS — Z0001 Encounter for general adult medical examination with abnormal findings: Secondary | ICD-10-CM | POA: Diagnosis not present

## 2022-12-31 ENCOUNTER — Other Ambulatory Visit (HOSPITAL_COMMUNITY): Payer: Self-pay

## 2023-01-03 ENCOUNTER — Other Ambulatory Visit: Payer: Self-pay

## 2023-01-08 ENCOUNTER — Other Ambulatory Visit (HOSPITAL_COMMUNITY): Payer: Self-pay

## 2023-01-10 NOTE — Progress Notes (Signed)
onar  Synopsis: Referred for COPD by Estanislado Pandy, MD  Subjective:   PATIENT ID: Vincent Cooper GENDER: male DOB: [birth year], MRN: [id number]  Chief Complaint  Patient presents with   Follow-up    Breathing is overall doing well. He is using his albuterol about once per day.    82yM with history of COPD on trelegy 200 and systemic steroids and used to see pulmonologist in [place], CAD, DM2, OSA on CPAP, HTN, PE 10/2019 - he is unsure if he still taking eliquis due to this PE  He has DOE to 100 feet. Worsening gradually overall but can have episodic worsening. He does have a productive cough typically in the morning. No CP. Has been on trelegy for a couple years.   He has been on systemic steroids for 2 months. Down to 10 mg daily.   He has never tried pulmonary rehab before  Not smoking currently, quit 20 ya. 50 py+ smoking.   Uses CPAP every night. But does have trouble breathing if he lies down flat. Probably excessively sleepy during the day.   Never had asthma as a kid, does frequently have sinus congestion but no history of sinus surgeries.   He father had emphysema  He worked in home improvement. He has lived in [place] and [place]. No MJ or vaping.   Interval HPI: No new imaging or spiro since last visit 09/2022  No trips to the hospital or UC for dyspnea. No courses of prednisone since last visit.   Home upkeep is about all he's doing active these days. No new bothersome cough.   Otherwise pertinent review of systems is negative.  Past Medical History:  Diagnosis Date   Chronic gout    COPD (chronic obstructive pulmonary disease) (HCC)    Oxygen dependent   Coronary artery disease due to calcified coronary lesion 03/08/2020   CORONARY CTA: Cor Ca++ Score 1651 !!: ~ 50% LM (CTFFR 1 - NS). Large Dom RCA-<PDA-PAV/PL - diffuse mild-mod plaque: prox (25-49%), mid (50-69%) CTFFR (p 0.99, m 0.85, d 0.81 - NS).  Med-Size LAD - prox-mid long diffuse Mod-Severe Ca++ plaque (~50-69%,  ?>70%): CTFFR p 0.95, m 0.88, d 0.86 -NS.  CTFFR LCx 0.94, OM1 0.90 - NS. (NS=Not Significant).   Diabetes mellitus type II, non insulin dependent (HCC)    Hypertension    Morbid obesity (HCC)    BMI of 38.5 with multiple risk factors.   OSA on CPAP    Pulmonary emboli (HCC) 10/2019   Chest CTA-4 Phadke opacification of main PA but there is partially occlusive main posterior RLL and segmental/segmental branches.  Small thrombus noted in the anterior right middle lobe.  No RV strain.  Scattered aortic atherosclerosis involving great vessels.  Coronary calcification noted.   RLS (restless legs syndrome)      Family History  Problem Relation Age of Onset   Colon cancer Mother    Emphysema Father        smoked   Diverticulitis Sister    Diabetes Brother    Diabetes Brother    Heart Problems Maternal Grandmother    Breast cancer Daughter    Lupus Son    CAD Neg Hx      Past Surgical History:  Procedure Laterality Date   BOWEL RESECTION N/A 10/18/2020   Procedure: SMALL BOWEL RESECTION;  Surgeon: Lucretia Roers, MD;  Location: [facility];  Service: General;  Laterality: N/A;   Cataract surgery Right    COLONOSCOPY WITH PROPOFOL N/A  09/27/2020   Procedure: COLONOSCOPY WITH PROPOFOL;  Surgeon: Malissa Hippo, MD;  Location: [facility];  Service: Endoscopy;  Laterality: N/A;  1:15   COLOSTOMY REVERSAL N/A 10/18/2020   Procedure: COLOSTOMY REVERSAL;  Surgeon: Lucretia Roers, MD;  Location: [facility];  Service: General;  Laterality: N/A;   LAPAROTOMY N/A 06/21/2020   Procedure: EXPLORATORY LAPAROTOMY,  bowel resection, creation ostomy;  Surgeon: Lucretia Roers, MD;  Location: [facility];  Service: General;  Laterality: N/A;   POLYPECTOMY  09/27/2020   Procedure: POLYPECTOMY;  Surgeon: Malissa Hippo, MD;  Location[facility];  Service: Endoscopy;;   TRANSTHORACIC ECHOCARDIOGRAM  10/20/2020   EF 60 to 65%.  Moderate LVH at least GR 1 DD.  Mild to moderate LA dilation.  Moderate  aortic valve calcification-sclerosis with no stenosis.  Normal RAP.   VENTRAL HERNIA REPAIR N/A 09/02/2022   Procedure: HERNIA REPAIR VENTRAL ADULT WITH  MESH;  Surgeon: Lucretia Roers, MD;  Location: [facility];  Service: General;  Laterality: N/A;    Social History   Socioeconomic History   Marital status: Legally Separated    Spouse name: Not on file   Number of children: 3   Years of education: Not on file   Highest education level: Not on file  Occupational History   Occupation: retired  Tobacco Use   Smoking status: Former    Packs/day: 2.00    Years: 55.00    Total pack years: 110.00    Types: Cigarettes    Quit date: 11/18/2001    Years since quitting: 21.1   Smokeless tobacco: Former  Building services engineer Use: Never used  Substance and Sexual Activity   Alcohol use: Not Currently   Drug use: Never   Sexual activity: Not Currently  Other Topics Concern   Not on file  Social History Narrative   Not on file   Social Determinants of Health   Financial Resource Strain: Not on file  Food Insecurity: No Food Insecurity (09/02/2022)   Hunger Vital Sign    Worried About Running Out of Food in the Last Year: Never true    Ran Out of Food in the Last Year: Never true  Transportation Needs: No Transportation Needs (09/02/2022)   PRAPARE - Administrator, Civil Service (Medical): No    Lack of Transportation (Non-Medical): No  Physical Activity: Not on file  Stress: Not on file  Social Connections: Not on file  Intimate Partner Violence: Not At Risk (09/02/2022)   Humiliation, Afraid, Rape, and Kick questionnaire    Fear of Current or Ex-Partner: No    Emotionally Abused: No    Physically Abused: No    Sexually Abused: No     Allergies  Allergen Reactions   Ancef [Cefazolin] Itching and Rash   Other     Patient reports he was allergic to something in an IV he was given but does not know what the substance was. As of 01/19/2020      Outpatient  Medications Prior to Visit  Medication Sig Dispense Refill   albuterol (VENTOLIN HFA) 108 (90 Base) MCG/ACT inhaler Inhale 1 puff into the lungs every 6 (six) hours as needed for wheezing or shortness of breath.     apixaban (ELIQUIS) 2.5 MG TABS tablet Take 1 tablet (2.5 mg total) by mouth 2 (two) times daily. 60 tablet    Budeson-Glycopyrrol-Formoterol (BREZTRI AEROSPHERE) 160-9-4.8 MCG/ACT AERO Inhale 2 puffs into the lungs in the  morning and at bedtime. 10.7 g 11   diltiazem (CARDIZEM CD) 180 MG 24 hr capsule Take 360 mg by mouth every morning.     docusate sodium (COLACE) 100 MG capsule Take 100 mg by mouth daily as needed for moderate constipation.     hydroxypropyl methylcellulose / hypromellose (ISOPTO TEARS / GONIOVISC) 2.5 % ophthalmic solution Place 1 drop into both eyes as needed for dry eyes.     ipratropium-albuterol (DUONEB) 0.5-2.5 (3) MG/3ML SOLN Take 3 mLs by nebulization every 6 (six) hours as needed (shortness of breath).      Mepolizumab (NUCALA) 100 MG/ML SOAJ Inject 1 mL (100 mg total) into the skin every 28 (twenty-eight) days. 3 mL 1   metoprolol tartrate (LOPRESSOR) 25 MG tablet Take 1 tablet (25 mg total) by mouth 2 (two) times daily. 60 tablet 3   nitroGLYCERIN (NITROSTAT) 0.4 MG SL tablet Place 0.4 mg under the tongue every 5 (five) minutes as needed for chest pain.     NON FORMULARY Pt uses a cpap nightly     ondansetron (ZOFRAN-ODT) 4 MG disintegrating tablet Take 1 tablet (4 mg total) by mouth every 6 (six) hours as needed for nausea. 20 tablet 0   oxyCODONE (OXY IR/ROXICODONE) 5 MG immediate release tablet Take 1 tablet (5 mg total) by mouth every 4 (four) hours as needed for severe pain or breakthrough pain. 20 tablet 0   rosuvastatin (CRESTOR) 40 MG tablet TAKE 1 TABLET BY MOUTH ONCE DAILY. 90 tablet 3   Spacer/Aero-Holding Chambers (AEROCHAMBER MV) inhaler Use as instructed 1 each 0   tamsulosin (FLOMAX) 0.4 MG CAPS capsule Take 1 capsule (0.4 mg total) by mouth  daily. (Patient taking differently: Take 0.4 mg by mouth at bedtime.) 30 capsule 1   torsemide (DEMADEX) 20 MG tablet Take 1 tablet (20 mg total) by mouth 2 (two) times daily. May take an extra 20 mg if needed for increase swelling (Patient taking differently: Take 20-40 mg by mouth See admin instructions. 40 mg in the morning, 20 mg in the evening) 200 tablet 3   Budeson-Glycopyrrol-Formoterol (BREZTRI AEROSPHERE) 160-9-4.8 MCG/ACT AERO Inhale 2 puffs into the lungs in the morning and at bedtime. 1 each 0   potassium chloride SA (KLOR-CON) 20 MEQ tablet Take 20 mEq by mouth daily.     No facility-administered medications prior to visit.       Objective:   Physical Exam:  General appearance: 82 y.o., male, NAD, conversant  Eyes: anicteric sclerae; PERRL, tracking appropriately HENT: NCAT; MMM Neck: Trachea midline; no lymphadenopathy, no JVD Lungs: good air movement, with normal work of breathing CV: RRR, no murmur  Abdomen: Soft, non-tender; non-distended, BS present  Extremities: 2+ble edema, warm, venous stasis changes Skin: Normal turgor and texture; no rash Psych: Appropriate affect Neuro: Alert and oriented to person and place, no focal deficit     Vitals:   01/13/23 0905  BP: 116/68  Pulse: 83  Temp: 97.8 F (36.6 C)  TempSrc: Oral  SpO2: 98%  Weight: 283 lb 9.6 oz (128.6 kg)  Height: 5\' 9"  (1.753 m)       98% on RA BMI Readings from Last 3 Encounters:  01/13/23 41.88 kg/m  10/17/22 40.46 kg/m  10/02/22 40.96 kg/m   Wt Readings from Last 3 Encounters:  01/13/23 283 lb 9.6 oz (128.6 kg)  10/17/22 274 lb (124.3 kg)  10/02/22 277 lb 6.4 oz (125.8 kg)     CBC    Component Value Date/Time  WBC 12.4 (H) 09/03/2022 0540   RBC 5.29 09/03/2022 0540   HGB 16.8 09/03/2022 0540   HCT 50.0 09/03/2022 0540   PLT 209 09/03/2022 0540   MCV 94.5 09/03/2022 0540   MCH 31.8 09/03/2022 0540   MCHC 33.6 09/03/2022 0540   RDW 14.6 09/03/2022 0540   LYMPHSABS  1.1 06/15/2022 0725   MONOABS 0.9 06/15/2022 0725   EOSABS 0.1 06/15/2022 0725   BASOSABS 0.0 06/15/2022 0725    Remarkable eosinophilia historically  Chest Imaging: CTA Chest 2020 with a little upper lobe emphysema and bronchial wall thickening  CT A/P 06/15/22 lung bases with a few foci of scar otherwise unremarkable  Pulmonary Functions Testing Results:    Latest Ref Rng & Units 06/07/2022   12:49 PM  PFT Results  FVC-Pre L 2.61   FVC-Predicted Pre % 77   FVC-Post L 2.60   FVC-Predicted Post % 76   Pre FEV1/FVC % % 67   Post FEV1/FCV % % 67   FEV1-Pre L 1.75   FEV1-Predicted Pre % 73   FEV1-Post L 1.73   DLCO uncorrected ml/min/mmHg 16.34   DLCO UNC% % 76   DLCO corrected ml/min/mmHg 16.34   DLCO COR %Predicted % 76   DLVA Predicted % 87   TLC L 5.60   TLC % Predicted % 89   RV % Predicted % 116      Echocardiogram:   TTE 10/2020:  1. Left ventricular ejection fraction, by estimation, is 60 to 65%. The  left ventricle has normal function. The left ventricle has no regional  wall motion abnormalities. There is moderate left ventricular hypertrophy.  Left ventricular diastolic  parameters are indeterminate.   2. Right ventricular systolic function is normal. The right ventricular  size is normal.   3. Left atrial size was mild to moderately dilated.   4. The mitral valve is normal in structure. No evidence of mitral valve  regurgitation. No evidence of mitral stenosis.   5. The aortic valve has an indeterminant number of cusps. There is  moderate calcification of the aortic valve. There is moderate thickening  of the aortic valve. Aortic valve regurgitation is not visualized. No  aortic stenosis is present.   6. The inferior vena cava is normal in size with greater than 50%  respiratory variability, suggesting right atrial pressure of 3 mmHg.      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 Excellent response to nucala, has weaned off of steroids with good control.  # OSA on CPAP [facility] is DME supplier  Plan: - continue nucala  - stay on breztri 2 puffs twice daily, rinse mouth/brush tongue after each use - albuterol as needed  - if feeling watery eyes, sinus congestion despite above measures, consider over the counter xyzal  - request download of CPAP from [facility] next visit - spirometry next visit - see you in 3 months or sooner if need be!    Omar Person, MD [facility] 01/13/2023 9:18 AM

## 2023-01-13 ENCOUNTER — Ambulatory Visit (INDEPENDENT_AMBULATORY_CARE_PROVIDER_SITE_OTHER): Payer: Medicare HMO | Admitting: Student

## 2023-01-13 VITALS — BP 116/68 | HR 83 | Temp 97.8°F | Ht 69.0 in | Wt 283.6 lb

## 2023-01-13 DIAGNOSIS — J4550 Severe persistent asthma, uncomplicated: Secondary | ICD-10-CM

## 2023-01-13 DIAGNOSIS — J4489 Other specified chronic obstructive pulmonary disease: Secondary | ICD-10-CM

## 2023-01-13 MED ORDER — BREZTRI AEROSPHERE 160-9-4.8 MCG/ACT IN AERO: 2.0000 | INHALATION_SPRAY | Freq: Two times a day (BID) | RESPIRATORY_TRACT | 0 refills | Status: DC

## 2023-01-13 NOTE — Patient Instructions (Signed)
-   continue nucala injection  - stay on breztri 2 puffs twice daily, rinse mouth/brush tongue after each use - albuterol as needed  - if feeling watery eyes, sinus congestion despite above measures, consider over the counter xyzal  - see you in 3 months or sooner if need be!

## 2023-01-28 ENCOUNTER — Other Ambulatory Visit (HOSPITAL_COMMUNITY): Payer: Self-pay

## 2023-01-28 DIAGNOSIS — Z6841 Body Mass Index (BMI) 40.0 and over, adult: Secondary | ICD-10-CM | POA: Diagnosis not present

## 2023-01-28 DIAGNOSIS — R030 Elevated blood-pressure reading, without diagnosis of hypertension: Secondary | ICD-10-CM | POA: Diagnosis not present

## 2023-01-28 DIAGNOSIS — R609 Edema, unspecified: Secondary | ICD-10-CM | POA: Diagnosis not present

## 2023-01-28 DIAGNOSIS — R0600 Dyspnea, unspecified: Secondary | ICD-10-CM | POA: Diagnosis not present

## 2023-01-28 DIAGNOSIS — I4891 Unspecified atrial fibrillation: Secondary | ICD-10-CM | POA: Diagnosis not present

## 2023-01-30 ENCOUNTER — Inpatient Hospital Stay (HOSPITAL_COMMUNITY)
Admission: EM | Admit: 2023-01-30 | Discharge: 2023-02-02 | DRG: 291 | Disposition: A | Discharge status: Home / Self Care | Payer: Medicare HMO | Attending: Internal Medicine | Admitting: Internal Medicine

## 2023-01-30 ENCOUNTER — Other Ambulatory Visit: Payer: Self-pay

## 2023-01-30 ENCOUNTER — Encounter (HOSPITAL_COMMUNITY): Payer: Self-pay | Admitting: *Deleted

## 2023-01-30 ENCOUNTER — Emergency Department (HOSPITAL_COMMUNITY): Payer: Medicare HMO

## 2023-01-30 DIAGNOSIS — Z7984 Long term (current) use of oral hypoglycemic drugs: Secondary | ICD-10-CM

## 2023-01-30 DIAGNOSIS — G2581 Restless legs syndrome: Secondary | ICD-10-CM | POA: Diagnosis present

## 2023-01-30 DIAGNOSIS — Z635 Disruption of family by separation and divorce: Secondary | ICD-10-CM | POA: Diagnosis not present

## 2023-01-30 DIAGNOSIS — I110 Hypertensive heart disease with heart failure: Principal | ICD-10-CM | POA: Diagnosis present

## 2023-01-30 DIAGNOSIS — Z7901 Long term (current) use of anticoagulants: Secondary | ICD-10-CM | POA: Diagnosis not present

## 2023-01-30 DIAGNOSIS — I2510 Atherosclerotic heart disease of native coronary artery without angina pectoris: Secondary | ICD-10-CM | POA: Diagnosis present

## 2023-01-30 DIAGNOSIS — Z825 Family history of asthma and other chronic lower respiratory diseases: Secondary | ICD-10-CM | POA: Diagnosis not present

## 2023-01-30 DIAGNOSIS — I5031 Acute diastolic (congestive) heart failure: Secondary | ICD-10-CM | POA: Diagnosis not present

## 2023-01-30 DIAGNOSIS — E1169 Type 2 diabetes mellitus with other specified complication: Secondary | ICD-10-CM | POA: Diagnosis not present

## 2023-01-30 DIAGNOSIS — E785 Hyperlipidemia, unspecified: Secondary | ICD-10-CM | POA: Diagnosis present

## 2023-01-30 DIAGNOSIS — E876 Hypokalemia: Secondary | ICD-10-CM | POA: Diagnosis not present

## 2023-01-30 DIAGNOSIS — I509 Heart failure, unspecified: Secondary | ICD-10-CM | POA: Diagnosis not present

## 2023-01-30 DIAGNOSIS — Z87891 Personal history of nicotine dependence: Secondary | ICD-10-CM | POA: Diagnosis not present

## 2023-01-30 DIAGNOSIS — Z833 Family history of diabetes mellitus: Secondary | ICD-10-CM | POA: Diagnosis not present

## 2023-01-30 DIAGNOSIS — G4733 Obstructive sleep apnea (adult) (pediatric): Secondary | ICD-10-CM | POA: Diagnosis present

## 2023-01-30 DIAGNOSIS — I5033 Acute on chronic diastolic (congestive) heart failure: Secondary | ICD-10-CM | POA: Diagnosis present

## 2023-01-30 DIAGNOSIS — I10 Essential (primary) hypertension: Secondary | ICD-10-CM

## 2023-01-30 DIAGNOSIS — Z9981 Dependence on supplemental oxygen: Secondary | ICD-10-CM | POA: Diagnosis not present

## 2023-01-30 DIAGNOSIS — R0602 Shortness of breath: Secondary | ICD-10-CM | POA: Diagnosis not present

## 2023-01-30 DIAGNOSIS — I4821 Permanent atrial fibrillation: Secondary | ICD-10-CM | POA: Diagnosis not present

## 2023-01-30 DIAGNOSIS — Z86711 Personal history of pulmonary embolism: Secondary | ICD-10-CM

## 2023-01-30 DIAGNOSIS — Z6841 Body Mass Index (BMI) 40.0 and over, adult: Secondary | ICD-10-CM

## 2023-01-30 DIAGNOSIS — I5020 Unspecified systolic (congestive) heart failure: Principal | ICD-10-CM

## 2023-01-30 DIAGNOSIS — E119 Type 2 diabetes mellitus without complications: Secondary | ICD-10-CM | POA: Diagnosis not present

## 2023-01-30 DIAGNOSIS — Z7951 Long term (current) use of inhaled steroids: Secondary | ICD-10-CM

## 2023-01-30 DIAGNOSIS — Z803 Family history of malignant neoplasm of breast: Secondary | ICD-10-CM

## 2023-01-30 DIAGNOSIS — J449 Chronic obstructive pulmonary disease, unspecified: Secondary | ICD-10-CM | POA: Diagnosis present

## 2023-01-30 DIAGNOSIS — Z79899 Other long term (current) drug therapy: Secondary | ICD-10-CM

## 2023-01-30 DIAGNOSIS — Z86718 Personal history of other venous thrombosis and embolism: Secondary | ICD-10-CM | POA: Diagnosis not present

## 2023-01-30 DIAGNOSIS — Z800 Family history of malignant neoplasm of digestive organs: Secondary | ICD-10-CM

## 2023-01-30 DIAGNOSIS — Z881 Allergy status to other antibiotic agents status: Secondary | ICD-10-CM

## 2023-01-30 DIAGNOSIS — J4489 Other specified chronic obstructive pulmonary disease: Secondary | ICD-10-CM | POA: Diagnosis present

## 2023-01-30 LAB — TSH: TSH: 3.583 u[IU]/mL (ref 0.350–4.500)

## 2023-01-30 LAB — CBC WITH DIFFERENTIAL/PLATELET
Abs Immature Granulocytes: 0.03 10*3/uL (ref 0.00–0.07)
Basophils Absolute: 0 10*3/uL (ref 0.0–0.1)
Basophils Relative: 0 %
Eosinophils Absolute: 0 10*3/uL (ref 0.0–0.5)
Eosinophils Relative: 1 %
HCT: 41.6 % (ref 39.0–52.0)
Hemoglobin: 14.6 g/dL (ref 13.0–17.0)
Immature Granulocytes: 0 %
Lymphocytes Relative: 21 %
Lymphs Abs: 1.6 10*3/uL (ref 0.7–4.0)
MCH: 32.5 pg (ref 26.0–34.0)
MCHC: 35.1 g/dL (ref 30.0–36.0)
MCV: 92.7 fL (ref 80.0–100.0)
Monocytes Absolute: 0.6 10*3/uL (ref 0.1–1.0)
Monocytes Relative: 8 %
Neutro Abs: 5.2 10*3/uL (ref 1.7–7.7)
Neutrophils Relative %: 70 %
Platelets: 200 10*3/uL (ref 150–400)
RBC: 4.49 MIL/uL (ref 4.22–5.81)
RDW: 15.9 % — ABNORMAL HIGH (ref 11.5–15.5)
WBC: 7.5 10*3/uL (ref 4.0–10.5)
nRBC: 0 % (ref 0.0–0.2)

## 2023-01-30 LAB — COMPREHENSIVE METABOLIC PANEL
ALT: 18 U/L (ref 0–44)
AST: 22 U/L (ref 15–41)
Albumin: 3.3 g/dL — ABNORMAL LOW (ref 3.5–5.0)
Alkaline Phosphatase: 50 U/L (ref 38–126)
Anion gap: 12 (ref 5–15)
BUN: 22 mg/dL (ref 8–23)
CO2: 32 mmol/L (ref 22–32)
Calcium: 8.5 mg/dL — ABNORMAL LOW (ref 8.9–10.3)
Chloride: 93 mmol/L — ABNORMAL LOW (ref 98–111)
Creatinine, Ser: 1.35 mg/dL — ABNORMAL HIGH (ref 0.61–1.24)
GFR, Estimated: 53 mL/min — ABNORMAL LOW (ref >60)
Glucose, Bld: 186 mg/dL — ABNORMAL HIGH (ref 70–99)
Potassium: 2.8 mmol/L — ABNORMAL LOW (ref 3.5–5.1)
Sodium: 137 mmol/L (ref 135–145)
Total Bilirubin: 1.4 mg/dL — ABNORMAL HIGH (ref 0.3–1.2)
Total Protein: 6.7 g/dL (ref 6.5–8.1)

## 2023-01-30 LAB — PHOSPHORUS: Phosphorus: 3.1 mg/dL (ref 2.5–4.6)

## 2023-01-30 LAB — TROPONIN I (HIGH SENSITIVITY)
Troponin I (High Sensitivity): 6 ng/L (ref <18)
Troponin I (High Sensitivity): 5 ng/L (ref <18)

## 2023-01-30 LAB — MAGNESIUM: Magnesium: 2 mg/dL (ref 1.7–2.4)

## 2023-01-30 LAB — BRAIN NATRIURETIC PEPTIDE: B Natriuretic Peptide: 108 pg/mL — ABNORMAL HIGH (ref 0.0–100.0)

## 2023-01-30 MED ORDER — ACETAMINOPHEN 325 MG PO TABS: 650.0000 mg | ORAL_TABLET | Freq: Four times a day (QID) | ORAL | Status: DC | PRN

## 2023-01-30 MED ORDER — SODIUM CHLORIDE 0.9% FLUSH: 3.0000 mL | INTRAVENOUS | Status: DC | PRN

## 2023-01-30 MED ORDER — POTASSIUM CHLORIDE CRYS ER 20 MEQ PO TBCR
40.0000 meq | EXTENDED_RELEASE_TABLET | Freq: Once | ORAL | Status: AC
  Administered 2023-01-30: 40 meq via ORAL
  Filled 2023-01-30: qty 2

## 2023-01-30 MED ORDER — FUROSEMIDE 10 MG/ML IJ SOLN
40.0000 mg | Freq: Two times a day (BID) | INTRAMUSCULAR | Status: DC
  Administered 2023-01-30 – 2023-01-31 (×2): 40 mg via INTRAVENOUS
  Filled 2023-01-30 (×2): qty 4

## 2023-01-30 MED ORDER — POTASSIUM CHLORIDE 10 MEQ/100ML IV SOLN
10.0000 meq | Freq: Once | INTRAVENOUS | Status: AC
  Administered 2023-01-30: 10 meq via INTRAVENOUS
  Filled 2023-01-30: qty 100

## 2023-01-30 MED ORDER — ACETAMINOPHEN 650 MG RE SUPP: 650.0000 mg | Freq: Four times a day (QID) | RECTAL | Status: DC | PRN

## 2023-01-30 MED ORDER — POLYVINYL ALCOHOL 1.4 % OP SOLN: 1.0000 [drp] | OPHTHALMIC | Status: DC | PRN

## 2023-01-30 MED ORDER — POTASSIUM CHLORIDE CRYS ER 20 MEQ PO TBCR
40.0000 meq | EXTENDED_RELEASE_TABLET | Freq: Every day | ORAL | Status: DC
  Administered 2023-01-30 – 2023-01-31 (×2): 40 meq via ORAL
  Filled 2023-01-30 (×4): qty 2

## 2023-01-30 MED ORDER — FLUTICASONE FUROATE-VILANTEROL 100-25 MCG/ACT IN AEPB
1.0000 | INHALATION_SPRAY | Freq: Every day | RESPIRATORY_TRACT | Status: DC
  Administered 2023-01-30 – 2023-02-02 (×4): 1 via RESPIRATORY_TRACT
  Filled 2023-01-30: qty 28

## 2023-01-30 MED ORDER — HYPROMELLOSE (GONIOSCOPIC) 2.5 % OP SOLN: 1.0000 [drp] | OPHTHALMIC | Status: DC | PRN

## 2023-01-30 MED ORDER — UMECLIDINIUM BROMIDE 62.5 MCG/ACT IN AEPB
1.0000 | INHALATION_SPRAY | Freq: Every day | RESPIRATORY_TRACT | Status: DC
Start: 2023-01-30 — End: 2023-02-02
  Administered 2023-01-30 – 2023-02-02 (×4): 1 via RESPIRATORY_TRACT
  Filled 2023-01-30: qty 7

## 2023-01-30 MED ORDER — FUROSEMIDE 10 MG/ML IJ SOLN
40.0000 mg | Freq: Once | INTRAMUSCULAR | Status: AC
  Administered 2023-01-30: 40 mg via INTRAVENOUS
  Filled 2023-01-30: qty 4

## 2023-01-30 MED ORDER — SODIUM CHLORIDE 0.9 % IV SOLN: 250.0000 mL | INTRAVENOUS | Status: DC | PRN

## 2023-01-30 MED ORDER — APIXABAN 2.5 MG PO TABS
2.5000 mg | ORAL_TABLET | Freq: Two times a day (BID) | ORAL | Status: DC
Start: 2023-01-30 — End: 2023-02-02
  Administered 2023-01-30 – 2023-02-02 (×7): 2.5 mg via ORAL
  Filled 2023-01-30 (×7): qty 1

## 2023-01-30 MED ORDER — ROSUVASTATIN CALCIUM 20 MG PO TABS
40.0000 mg | ORAL_TABLET | Freq: Every day | ORAL | Status: DC
  Administered 2023-01-30 – 2023-02-02 (×4): 40 mg via ORAL
  Filled 2023-01-30 (×4): qty 2

## 2023-01-30 MED ORDER — ONDANSETRON HCL 4 MG/2ML IJ SOLN: 4.0000 mg | Freq: Four times a day (QID) | INTRAMUSCULAR | Status: DC | PRN

## 2023-01-30 MED ORDER — BUDESON-GLYCOPYRROL-FORMOTEROL 160-9-4.8 MCG/ACT IN AERO: 2.0000 | INHALATION_SPRAY | Freq: Two times a day (BID) | RESPIRATORY_TRACT | Status: DC

## 2023-01-30 MED ORDER — IPRATROPIUM-ALBUTEROL 0.5-2.5 (3) MG/3ML IN SOLN: 3.0000 mL | Freq: Four times a day (QID) | RESPIRATORY_TRACT | Status: DC | PRN

## 2023-01-30 MED ORDER — ONDANSETRON HCL 4 MG PO TABS: 4.0000 mg | ORAL_TABLET | Freq: Four times a day (QID) | ORAL | Status: DC | PRN

## 2023-01-30 MED ORDER — DILTIAZEM HCL ER COATED BEADS 180 MG PO CP24
360.0000 mg | ORAL_CAPSULE | ORAL | Status: DC
  Administered 2023-01-30 – 2023-02-01 (×3): 360 mg via ORAL
  Filled 2023-01-30 (×3): qty 2

## 2023-01-30 MED ORDER — METOPROLOL TARTRATE 25 MG PO TABS
25.0000 mg | ORAL_TABLET | Freq: Two times a day (BID) | ORAL | Status: DC
  Administered 2023-01-30 – 2023-02-02 (×7): 25 mg via ORAL
  Filled 2023-01-30 (×7): qty 1

## 2023-01-30 MED ORDER — SODIUM CHLORIDE 0.9% FLUSH
3.0000 mL | Freq: Two times a day (BID) | INTRAVENOUS | Status: DC
  Administered 2023-01-31 – 2023-02-02 (×5): 3 mL via INTRAVENOUS

## 2023-01-30 MED ORDER — TAMSULOSIN HCL 0.4 MG PO CAPS
0.4000 mg | ORAL_CAPSULE | Freq: Every day | ORAL | Status: DC
  Administered 2023-01-30 – 2023-02-01 (×3): 0.4 mg via ORAL
  Filled 2023-01-30 (×3): qty 1

## 2023-01-30 NOTE — Assessment & Plan Note (Addendum)
-  Repleted -Continue to follow trend and further replete as needed.

## 2023-01-30 NOTE — Assessment & Plan Note (Addendum)
-  No wheezing currently appreciated -Continue bronchodilator management and supportive care. -Patient shortness of breath symptoms secondary to fluid overload and acute CHF exacerbation.

## 2023-01-30 NOTE — Assessment & Plan Note (Addendum)
-  A1c 6.8 currently -Sliding scale insulin will be order if CBGs more than 200.. -Patient was just following modified carbohydrate diet as an outpatient. -Continue to follow CBGs fluctuation.

## 2023-01-30 NOTE — ED Triage Notes (Signed)
Pt c/o SOB that worsened last night. Pt reports he had SOB several days ago and went to his PCP who increased his fluid pill. After he increased it he lost 5lbs and started feeling better, but then last night it worsened.

## 2023-01-30 NOTE — Assessment & Plan Note (Signed)
Continue statin. 

## 2023-01-30 NOTE — Assessment & Plan Note (Signed)
-  Body mass index is 41.94 kg/m. -Low-calorie diet, portion control and increase physical activity discussed with patient.

## 2023-01-30 NOTE — Consult Note (Addendum)
CARDIOLOGY CONSULT NOTE    Patient ID: Vincent Cooper; AY:[id number]; 02-23-1941   Admit date: 01/30/2023 Date of Consult: 01/30/2023  Primary Care Provider: Manon Hilding, MD Primary Cardiologist:  Primary Electrophysiologist:     Patient Profile:   Vincent Cooper is a 82 y.o. 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 Roderic Palau.  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  Patient has not taken prednisone in the last 1 month. He stated he weighed 270 pounds last month but noticed 289 pounds this month and his PCP told him to take additional dose of torsemide after which he lost 5 pounds of weight. He initially felt better but started to develop similar symptoms of SOB, orthopnea and PND (was sleeping in recliner for the last few days) and has no leg swelling. Denies any symptoms of chest pain, dizziness, syncope, palpitations. Has no leg swelling. BNP is minimally elevated at 108. Chest x-ray showed borderline cardiac enlargement but clear lungs.  He underwent cardiac CT in 02/2020 which showed coronary calcium score of 1651 (83rd percentile for age and sex matched control) and negative FFR.  Past Medical History:  Diagnosis Date   Chronic gout    COPD (chronic obstructive pulmonary disease) ([place])    Oxygen dependent   Coronary artery disease due to calcified coronary lesion 03/08/2020   CORONARY CTA: Cor Ca++ Score 1651 !!: ~ 50% LM (CTFFR 1 - NS). Large Dom RCA-<PDA-PAV/PL - diffuse mild-mod plaque: prox (25-49%), mid (50-69%) CTFFR (p 0.99, m 0.85, d  0.81 - NS).  Med-Size LAD - prox-mid long diffuse Mod-Severe Ca++ plaque (~50-69%, ?>70%): CTFFR p 0.95, m 0.88, d 0.86 -NS.  CTFFR LCx 0.94, OM1 0.90 - NS. (NS=Not Significant).   Diabetes mellitus type II, non insulin dependent (HCC)    Hypertension    Morbid obesity (HCC)    BMI of 38.5 with multiple risk factors.   OSA on CPAP    Pulmonary emboli ([place]) 10/2019   Chest CTA-4 Phadke opacification of main PA but there is partially occlusive main posterior RLL and segmental/segmental branches.  Small thrombus noted in the anterior right middle lobe.  No RV strain.  Scattered aortic atherosclerosis involving great vessels.  Coronary calcification noted.   RLS (restless legs syndrome)     Past Surgical History:  Procedure Laterality Date   BOWEL RESECTION N/A 10/18/2020   Procedure: SMALL BOWEL RESECTION;  Surgeon: Virl Cagey, MD;  Location: [facility];  Service: General;  Laterality: N/A;   Cataract surgery Right    COLONOSCOPY WITH PROPOFOL N/A 09/27/2020   Procedure: COLONOSCOPY WITH PROPOFOL;  Surgeon: Rogene Houston, MD;  [facility];  Service: Endoscopy;  Laterality: N/A;  1:15   COLOSTOMY REVERSAL N/A 10/18/2020   Procedure: COLOSTOMY REVERSAL;  Surgeon: Virl Cagey, MD;  Location: [facility];  Service: General;  Laterality: N/A;   LAPAROTOMY N/A 06/21/2020   Procedure: EXPLORATORY LAPAROTOMY,  bowel resection, creation ostomy;  Surgeon: Virl Cagey, MD;  Location: [facility];  Service: General;  Laterality: N/A;   POLYPECTOMY  09/27/2020   Procedure: POLYPECTOMY;  Surgeon: Rogene Houston, MD;  [facility];  Service: Endoscopy;;   TRANSTHORACIC ECHOCARDIOGRAM  10/20/2020   EF 60 to 65%.  Moderate LVH at least GR 1 DD.  Mild to moderate LA dilation.  Moderate aortic valve calcification-sclerosis with no stenosis.  Normal RAP.   VENTRAL HERNIA REPAIR N/A 09/02/2022   Procedure: HERNIA REPAIR VENTRAL ADULT WITH  MESH;  Surgeon: Virl Cagey, MD;   Location: [facility];  Service: General;  Laterality: N/A;       Inpatient Medications: Scheduled Meds:  apixaban  2.5 mg Oral BID   furosemide  40 mg Intravenous Once   Continuous Infusions:  PRN Meds:   Allergies:    Allergies  Allergen Reactions   Ancef [Cefazolin] Itching and Rash   Other     Patient reports he was allergic to something in an IV he was given but does not know what the substance was. As of 01/19/2020     Social History:   Social History   Socioeconomic History   Marital status: Legally Separated    Spouse name: Not on file   Number of children: 3   Years of education: Not on file   Highest education level: Not on file  Occupational History   Occupation: retired  Tobacco Use   Smoking status: Former    Packs/day: 2.00    Years: 55.00    Additional pack years: 0.00    Total pack years: 110.00    Types: Cigarettes    Quit date: 11/18/2001    Years since quitting: 21.2   Smokeless tobacco: Former  Scientific laboratory technician Use: Never used  Substance and Sexual Activity   Alcohol use: Yes    Comment: sometimes   Drug use: Never   Sexual activity: Not Currently  Other Topics Concern   Not on file  Social History Narrative   Not on file   Social Determinants of Health   Financial Resource Strain: Not on file  Food Insecurity: No Food Insecurity (09/02/2022)   Hunger Vital Sign    Worried About Running Out of Food in the Last Year: Never true    [place] in the Last Year: Never true  Transportation Needs: No Transportation Needs (09/02/2022)   PRAPARE - Hydrologist (Medical): No    Lack of Transportation (Non-Medical): No  Physical Activity: Not on file  Stress: Not on file  Social Connections: Not on file  Intimate Partner Violence: Not At Risk (09/02/2022)   Humiliation, Afraid, Rape, and Kick questionnaire    Fear of Current or Ex-Partner: No    Emotionally Abused: No    Physically Abused: No    Sexually  Abused: No    Family History:    Family History  Problem Relation Age of Onset   Colon cancer Mother    Emphysema Father        smoked   Diverticulitis Sister    Diabetes Brother    Diabetes Brother    Heart Problems Maternal Grandmother    Breast cancer Daughter    Lupus Son    CAD Neg Hx      ROS:  Please see the history of present illness.  ROS  All other ROS reviewed and negative.     Physical Exam/Data:   Vitals:   01/30/23 0745 01/30/23 0746 01/30/23 0748  BP:  139/79  Pulse:   85  Resp:   14  Temp:   97.8 F (36.6 C)  TempSrc:   Oral  SpO2: 97%  95%  Weight:  128.8 kg   Height:  '5\' 9"'$  (1.753 m)    No intake or output data in the 24 hours ending 01/30/23 1052 Filed Weights   01/30/23 0746  Weight: 128.8 kg   Body mass index is 41.94 kg/m.  General:  Well nourished, well developed, in no acute distress HEENT: normal Lymph: no adenopathy Neck: JVD unable to examine due to body habitus Endocrine:  No thryomegaly Vascular: No carotid bruits; FA pulses 2+ bilaterally without bruits  Cardiac:  normal S1, S2; RRR; no murmur  Lungs:  clear to auscultation bilaterally, no wheezing, rhonchi or rales  Abd: soft, nontender, no hepatomegaly  Ext: no edema Musculoskeletal:  No deformities, BUE and BLE strength normal and equal Skin: warm and dry  Neuro:  CNs 2-12 intact, no focal abnormalities noted Psych:  Normal affect   EKG:  The EKG was personally reviewed and demonstrates:  Afib, rate controlled Telemetry:  Telemetry was personally reviewed and demonstrates:    Relevant CV Studies:   Laboratory Data:  Chemistry Recent Labs  Lab 01/30/23 0748  NA 137  K 2.8*  CL 93*  CO2 32  GLUCOSE 186*  BUN 22  CREATININE 1.35*  CALCIUM 8.5*  GFRNONAA 53*  ANIONGAP 12    Recent Labs  Lab 01/30/23 0748  PROT 6.7  ALBUMIN 3.3*  AST 22  ALT 18  ALKPHOS 50  BILITOT 1.4*   Hematology Recent Labs  Lab 01/30/23 0748  WBC 7.5  RBC 4.49  HGB 14.6   HCT 41.6  MCV 92.7  MCH 32.5  MCHC 35.1  RDW 15.9*  PLT 200   Cardiac EnzymesNo results for input(s): "TROPONINI" in the last 168 hours. No results for input(s): "TROPIPOC" in the last 168 hours.  BNP Recent Labs  Lab 01/30/23 0748  BNP 108.0*    DDimer No results for input(s): "DDIMER" in the last 168 hours.  Radiology/Studies:  [facility] Chest Port 1 View  Result Date: 01/30/2023 CLINICAL DATA:  Shortness of breath. EXAM: PORTABLE CHEST 1 VIEW COMPARISON:  02/03/2022 FINDINGS: The heart is borderline enlarged but stable. Stable tortuosity and calcification of the thoracic aorta. The lungs are clear of an acute process. No infiltrates, edema or effusions. No pulmonary lesions or pneumothorax. The bony thorax is intact. IMPRESSION: Borderline cardiac enlargement but no acute pulmonary findings. Electronically Signed   By: Marijo Sanes M.D.   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  # Acute on chronic diastolic heart failure exacerbation -Patient has not taken prednisone in the last 1 month. He stated he weighed 270 pounds last month but noticed 289 pounds this month and his PCP told him to take additional dose of torsemide after which he lost 5 pounds of weight. He initially felt better but started to develop similar symptoms of SOB, orthopnea and PND (was sleeping in recliner for the last few days) and has no leg swelling. BNP is mildly elevated, 108. Start IV Lasix 80 mg three times daily and hold home dose of torsemide. -Heart failure exacerbation being an  anginal equivalent, he will need to follow with outpatient cardiology if he will benefit from [facility] due to elevated coronary calcium score of 1600.  # Permanent  A-fib -Continue diltiazem 360 mg once daily -Continue Eliquis 2.5 mg twice daily  I have spent a total of 70 minutes with patient reviewing chart , telemetry, EKGs, labs and examining patient as well as establishing an assessment and plan that was discussed with the patient.  > 50% of time was spent in direct patient care.     For questions or updates, please contact [facility] Please consult [email or web address] for contact info under Cardiology/STEMI.   Signed, Vangie Bicker, MD 01/30/2023 10:52 AM

## 2023-01-30 NOTE — ED Provider Notes (Signed)
[facility] Provider Note   CSN: [id number] Arrival date & time: 01/30/23  0725     History  Chief Complaint  Patient presents with   Shortness of Breath    Vincent Cooper is a 82 y.o. male.  Patient has a history of of heart failure, atrial fibs and COPD.  He complains of shortness of breath.  He saw his primary care doctor recently and they increased his Demadex to 20 mg a day but he is still short of breath.  The history is provided by the patient and medical records. No language interpreter was used.  Shortness of Breath Severity:  Moderate Onset quality:  Sudden Progression:  Worsening Chronicity:  New Context: activity   Relieved by:  Nothing Worsened by:  Nothing Ineffective treatments:  None tried Associated symptoms: no abdominal pain, no chest pain, no cough, no headaches and no rash        Home Medications Prior to Admission medications   Medication Sig Start Date End Date Taking? Authorizing Provider  albuterol (VENTOLIN HFA) 108 (90 Base) MCG/ACT inhaler Inhale 1 puff into the lungs every 6 (six) hours as needed for wheezing or shortness of breath.   Yes [provider]  apixaban (ELIQUIS) 2.5 MG TABS tablet Take 1 tablet (2.5 mg total) by mouth 2 (two) times daily. 09/05/22  Yes Virl Cagey, MD  Budeson-Glycopyrrol-Formoterol (BREZTRI AEROSPHERE) 160-9-4.8 MCG/ACT AERO Inhale 2 puffs into the lungs in the morning and at bedtime. 01/13/23  Yes Maryjane Hurter, MD  diltiazem (CARDIZEM CD) 180 MG 24 hr capsule Take 360 mg by mouth every morning. 01/22/21  Yes [provider]  hydroxypropyl methylcellulose / hypromellose (ISOPTO TEARS / GONIOVISC) 2.5 % ophthalmic solution Place 1 drop into both eyes as needed for dry eyes.   Yes [provider]  Mepolizumab (NUCALA) 100 MG/ML SOAJ Inject 1 mL (100 mg total) into the skin every 28 (twenty-eight) days. 11/06/22  Yes Maryjane Hurter, MD   metoprolol tartrate (LOPRESSOR) 25 MG tablet Take 1 tablet (25 mg total) by mouth 2 (two) times daily. 06/29/20  Yes Emokpae, Courage, MD  nitroGLYCERIN (NITROSTAT) 0.4 MG SL tablet Place 0.4 mg under the tongue every 5 (five) minutes as needed for chest pain.   Yes [provider]  potassium chloride SA (KLOR-CON M) 20 MEQ tablet Take 20 mEq by mouth daily. 01/21/23  Yes [provider]  rosuvastatin (CRESTOR) 40 MG tablet TAKE 1 TABLET BY MOUTH ONCE DAILY. 09/13/22  Yes Leonie Man, MD  tamsulosin (FLOMAX) 0.4 MG CAPS capsule Take 1 capsule (0.4 mg total) by mouth daily. Patient taking differently: Take 0.4 mg by mouth at bedtime. 06/30/20  Yes Emokpae, Courage, MD  torsemide (DEMADEX) 20 MG tablet Take 1 tablet (20 mg total) by mouth 2 (two) times daily. May take an extra 20 mg if needed for increase swelling Patient taking differently: Take 40 mg by mouth 2 (two) times daily. 11/09/20  Yes Leonie Man, MD  NON FORMULARY Pt uses a cpap nightly    [provider]  ondansetron (ZOFRAN-ODT) 4 MG disintegrating tablet Take 1 tablet (4 mg total) by mouth every 6 (six) hours as needed for nausea. Patient not taking: Reported on 01/30/2023 09/03/22   Virl Cagey, MD  oxyCODONE (OXY IR/ROXICODONE) 5 MG immediate release tablet Take 1 tablet (5 mg total) by mouth every 4 (four) hours as needed for severe pain or breakthrough pain. Patient  not taking: Reported on 01/30/2023 09/03/22   Virl Cagey, MD  Spacer/Aero-Holding Chambers (AEROCHAMBER MV) inhaler Use as instructed 04/12/22   Maryjane Hurter, MD      Allergies    Ancef [cefazolin] and Other    Review of Systems   Review of Systems  Constitutional:  Negative for appetite change and fatigue.  HENT:  Negative for congestion, ear discharge and sinus pressure.   Eyes:  Negative for discharge.  Respiratory:  Positive for shortness of breath. Negative for cough.   Cardiovascular:  Negative for chest  pain.  Gastrointestinal:  Negative for abdominal pain and diarrhea.  Genitourinary:  Negative for frequency and hematuria.  Musculoskeletal:  Negative for back pain.  Skin:  Negative for rash.  Neurological:  Negative for seizures and headaches.  Psychiatric/Behavioral:  Negative for hallucinations.     Physical Exam Updated Vital Signs BP 139/79 (BP Location: Left Arm)   Pulse 85   Temp 97.8 F (36.6 C) (Oral)   Resp 14   Ht 5\' 9"  (1.753 m)   Wt 128.8 kg   SpO2 95%   BMI 41.94 kg/m  Physical Exam Vitals and nursing note reviewed.  Constitutional:      Appearance: He is well-developed.  HENT:     Head: Normocephalic.     Nose: Nose normal.  Eyes:     General: No scleral icterus.    Conjunctiva/sclera: Conjunctivae normal.  Neck:     Thyroid: No thyromegaly.  Cardiovascular:     Rate and Rhythm: Normal rate and regular rhythm.     Heart sounds: No murmur heard.    No friction rub. No gallop.  Pulmonary:     Breath sounds: No stridor. No wheezing or rales.  Chest:     Chest wall: No tenderness.  Abdominal:     General: There is no distension.     Tenderness: There is no abdominal tenderness. There is no rebound.  Musculoskeletal:        General: Normal range of motion.     Cervical back: Neck supple.     Right lower leg: Edema present.     Left lower leg: Edema present.  Lymphadenopathy:     Cervical: No cervical adenopathy.  Skin:    Findings: No erythema or rash.  Neurological:     Mental Status: He is alert and oriented to person, place, and time.     Motor: No abnormal muscle tone.     Coordination: Coordination normal.  Psychiatric:        Behavior: Behavior normal.     ED Results / Procedures / Treatments   Labs (all labs ordered are listed, but only abnormal results are displayed) Labs Reviewed  CBC WITH DIFFERENTIAL/PLATELET - Abnormal; Notable for the following components:      Result Value   RDW 15.9 (*)    All other components within  normal limits  COMPREHENSIVE METABOLIC PANEL - Abnormal; Notable for the following components:   Potassium 2.8 (*)    Chloride 93 (*)    Glucose, Bld 186 (*)    Creatinine, Ser 1.35 (*)    Calcium 8.5 (*)    Albumin 3.3 (*)    Total Bilirubin 1.4 (*)    GFR, Estimated 53 (*)    All other components within normal limits  BRAIN NATRIURETIC PEPTIDE - Abnormal; Notable for the following components:   B Natriuretic Peptide 108.0 (*)    All other components within normal limits  TROPONIN I (  HIGH SENSITIVITY)  TROPONIN I (HIGH SENSITIVITY)    EKG None  Radiology DG Chest Port 1 View  Result Date: 01/30/2023 CLINICAL DATA:  Shortness of breath. EXAM: PORTABLE CHEST 1 VIEW COMPARISON:  02/03/2022 FINDINGS: The heart is borderline enlarged but stable. Stable tortuosity and calcification of the thoracic aorta. The lungs are clear of an acute process. No infiltrates, edema or effusions. No pulmonary lesions or pneumothorax. The bony thorax is intact. IMPRESSION: Borderline cardiac enlargement but no acute pulmonary findings. Electronically Signed   By: Marijo Sanes M.D.   On: 01/30/2023 08:25    Procedures Procedures    Medications Ordered in ED Medications  potassium chloride 10 mEq in 100 mL IVPB (10 mEq Intravenous New Bag/Given 01/30/23 0941)  furosemide (LASIX) injection 40 mg (has no administration in time range)  apixaban (ELIQUIS) tablet 2.5 mg (has no administration in time range)  potassium chloride SA (KLOR-CON M) CR tablet 40 mEq (40 mEq Oral Given 01/30/23 [id number])    ED Course/ Medical Decision Making/ A&P                             Medical Decision Making Amount and/or Complexity of Data Reviewed Labs: ordered. Radiology: ordered. ECG/medicine tests: ordered.  Risk Prescription drug management. Decision regarding hospitalization.  This patient presents to the ED for concern of shortness of breath, this involves an extensive number of treatment options, and is a  complaint that carries with it a high risk of complications and morbidity.  The differential diagnosis includes congestive heart Failure, COPD   Co morbidities that complicate the patient evaluation  Heart disease   Additional history obtained:  Additional history obtained from patient External records from outside source obtained and reviewed including hospital records   Lab Tests:  I Ordered, and personally interpreted labs.  The pertinent results include: Troponin is negative and BNP 108   Imaging Studies ordered:  I ordered imaging studies including chest x-ray I independently visualized and interpreted imaging which showed enlarged heart otherwise negative I agree with the radiologist interpretation   Cardiac Monitoring: / EKG:  The patient was maintained on a cardiac monitor.  I personally viewed and interpreted the cardiac monitored which showed an underlying rhythm of: Normal sinus rhythm   Consultations Obtained:  I requested consultation with the hospitalist and cardiologist,  and discussed lab and imaging findings as well as pertinent plan - they recommend: Admit to hospitalist with cardiology consult   Problem List / ED Course / Critical interventions / Medication management  Shortness of breath and heart failure I ordered medication including Lasix for heart failure Reevaluation of the patient after these medicines showed that the patient improved I have reviewed the patients home medicines and have made adjustments as needed   Social Determinants of Health:  None   Test / Admission - Considered:  None  Patient with worsening heart failure.  He was seen by cardiology and they recommend admitting the patient for IV Lasix.  Hospitalist will admit the patient        Final Clinical Impression(s) / ED Diagnoses Final diagnoses:  Systolic congestive heart failure, unspecified HF chronicity ([place])    Rx / DC Orders ED Discharge Orders     None          Milton Ferguson, MD 01/31/23 [phone]

## 2023-01-30 NOTE — Assessment & Plan Note (Addendum)
-  Stable overall -Continue current adjusted antihypertensive agents and reassess blood pressure stability during follow-up visit. -Heart healthy/low-sodium diet discussed with patient.

## 2023-01-30 NOTE — Assessment & Plan Note (Addendum)
-  Patient presenting with over 10 pounds weight gain since his last office visit -Dyspnea on exertion, increased swelling in his lower extremities and orthopnea has been reported. -BNP elevated and vascular congestion appreciated on chest x-ray. -2D echo demonstrating preserved ejection fraction and no wall motion abnormalities. -Reds clip measurements improved and below 36 at time of discharge. -Excellent response to aggressive diuresis; electrolytes and renal function stable at time of discharge -Patient will go home on torsemide 40 mg twice a day and added low-dose Aldactone on daily basis. -Instructed to follow low-sodium diet and to continue checking weight on daily basis. -Patient will follow-up with cardiology and PCP as an outpatient.

## 2023-01-30 NOTE — Progress Notes (Signed)
Patient declined CPAP tonight. No unit in room at this time. °

## 2023-01-30 NOTE — Assessment & Plan Note (Addendum)
-  Rate controlled currently -Continue the use of diltiazem and, metoprolol and continue the use of Eliquis for secondary prevention. -Continue to closely follow patient's electrolytes level with goal for potassium above 4 and magnesium above 2.

## 2023-01-30 NOTE — H&P (Signed)
History and Physical    Patient: Vincent Cooper [id number] DOB: 1941-07-13 DOA: 01/30/2023 DOS: the patient was seen and examined on 01/30/2023 PCP: Vincent Hilding, MD  Patient coming from: Home  Chief Complaint:  Chief Complaint  Patient presents with   Shortness of Breath   HPI: Vincent Cooper is a 82 y.o. 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 Patient reported symptom has been present for the last 5 days or so and worsening; he has contacted PCP who adjusted his diuretic regimen which shows partial improvement in his symptoms initially and no longer working.  Patient reports no chest pain, no nausea or vomiting; there has not been any diaphoresis, hematuria, focal weaknesses, sick contacts or any other complaints.  Chest x-ray demonstrating vascular congestion without pulmonary edema; elevated BNP and a physical exam positive crackles and increased lower extremity swelling.   IV Lasix x 1 given in the ED along with electrolyte repletion for his potassium.  Cardiology service was consulted and [facility] contacted to place patient in the hospital for further evaluation and management.  Review of Systems: As mentioned in the history of present illness. All other systems reviewed and are negative. Past Medical History:  Diagnosis Date   Chronic gout    COPD (chronic obstructive pulmonary disease) ([place])    Oxygen dependent   Coronary artery disease due to calcified coronary lesion 03/08/2020   CORONARY CTA: Cor Ca++ Score 1651 !!: ~ 50% LM (CTFFR 1 - NS). Large Dom RCA-<PDA-PAV/PL - diffuse mild-mod plaque: prox (25-49%), mid (50-69%) CTFFR (p 0.99, m 0.85, d  0.81 - NS).  Med-Size LAD - prox-mid long diffuse Mod-Severe Ca++ plaque (~50-69%, ?>70%): CTFFR p 0.95, m 0.88, d 0.86 -NS.  CTFFR LCx 0.94, OM1 0.90 - NS. (NS=Not Significant).   Diabetes mellitus type II, non insulin dependent (HCC)    Hypertension    Morbid obesity (HCC)    BMI of 38.5 with multiple risk factors.   OSA on CPAP    Pulmonary emboli ([place]) 10/2019   Chest CTA-4 [place] opacification of main PA but there is partially occlusive main posterior RLL and segmental/segmental branches.  Small thrombus noted in the anterior right middle lobe.  No RV strain.  Scattered aortic atherosclerosis involving great vessels.  Coronary calcification noted.   RLS (restless legs syndrome)    Past Surgical History:  Procedure Laterality Date   BOWEL RESECTION N/A 10/18/2020   Procedure: SMALL BOWEL RESECTION;  Surgeon: Virl Cagey, MD;  Location: [facility];  Service: General;  Laterality: N/A;   Cataract surgery Right    COLONOSCOPY WITH PROPOFOL N/A 09/27/2020   Procedure: COLONOSCOPY WITH PROPOFOL;  Surgeon: Rogene Houston, MD;  Location: [facility];  Service: Endoscopy;  Laterality: N/A;  1:15   COLOSTOMY REVERSAL N/A 10/18/2020   Procedure: COLOSTOMY REVERSAL;  Surgeon: Virl Cagey, MD;  Location: [facility];  Service: General;  Laterality: N/A;   LAPAROTOMY N/A 06/21/2020   Procedure: EXPLORATORY LAPAROTOMY,  bowel resection, creation ostomy;  Surgeon: Virl Cagey, MD;  Location: [facility];  Service: General;  Laterality: N/A;   POLYPECTOMY  09/27/2020   Procedure: POLYPECTOMY;  Surgeon: Rogene Houston, MD;  Location: [facility];  Service: Endoscopy;;  TRANSTHORACIC ECHOCARDIOGRAM  10/20/2020   EF 60 to 65%.  Moderate LVH at least GR 1 DD.  Mild to moderate LA dilation.  Moderate aortic valve calcification-sclerosis with no stenosis.  Normal RAP.   VENTRAL HERNIA REPAIR N/A 09/02/2022   Procedure: HERNIA REPAIR VENTRAL ADULT WITH  MESH;  Surgeon: Virl Cagey, MD;   Location: [facility];  Service: General;  Laterality: N/A;   Social History:  reports that he quit smoking about 21 years ago. His smoking use included cigarettes. He has a 110.00 pack-year smoking history. He has quit using smokeless tobacco. He reports current alcohol use. He reports that he does not use drugs.  Allergies  Allergen Reactions   Ancef [Cefazolin] Itching and Rash   Other     Patient reports he was allergic to something in an IV he was given but does not know what the substance was. As of 01/19/2020     Family History  Problem Relation Age of Onset   Colon cancer Mother    Emphysema Father        smoked   Diverticulitis Sister    Diabetes Brother    Diabetes Brother    Heart Problems Maternal Grandmother    Breast cancer Daughter    Lupus Son    CAD Neg Hx     Prior to Admission medications   Medication Sig Start Date End Date Taking? Authorizing Provider  albuterol (VENTOLIN HFA) 108 (90 Base) MCG/ACT inhaler Inhale 1 puff into the lungs every 6 (six) hours as needed for wheezing or shortness of breath.   Yes [provider]  apixaban (ELIQUIS) 2.5 MG TABS tablet Take 1 tablet (2.5 mg total) by mouth 2 (two) times daily. 09/05/22  Yes Virl Cagey, MD  Budeson-Glycopyrrol-Formoterol (BREZTRI AEROSPHERE) 160-9-4.8 MCG/ACT AERO Inhale 2 puffs into the lungs in the morning and at bedtime. 01/13/23  Yes Maryjane Hurter, MD  diltiazem (CARDIZEM CD) 180 MG 24 hr capsule Take 360 mg by mouth every morning. 01/22/21  Yes [provider]  hydroxypropyl methylcellulose / hypromellose (ISOPTO TEARS / GONIOVISC) 2.5 % ophthalmic solution Place 1 drop into both eyes as needed for dry eyes.   Yes [provider]  Mepolizumab (NUCALA) 100 MG/ML SOAJ Inject 1 mL (100 mg total) into the skin every 28 (twenty-eight) days. 11/06/22  Yes Maryjane Hurter, MD  metoprolol tartrate (LOPRESSOR) 25 MG tablet Take 1 tablet (25 mg total) by mouth 2 (two) times  daily. 06/29/20  Yes Emokpae, Courage, MD  nitroGLYCERIN (NITROSTAT) 0.4 MG SL tablet Place 0.4 mg under the tongue every 5 (five) minutes as needed for chest pain.   Yes [provider]  potassium chloride SA (KLOR-CON M) 20 MEQ tablet Take 20 mEq by mouth daily. 01/21/23  Yes [provider]  rosuvastatin (CRESTOR) 40 MG tablet TAKE 1 TABLET BY MOUTH ONCE DAILY. 09/13/22  Yes Leonie Man, MD  tamsulosin (FLOMAX) 0.4 MG CAPS capsule Take 1 capsule (0.4 mg total) by mouth daily. Patient taking differently: Take 0.4 mg by mouth at bedtime. 06/30/20  Yes Emokpae, Courage, MD  torsemide (DEMADEX) 20 MG tablet Take 1 tablet (20 mg total) by mouth 2 (two) times daily. May take an extra 20 mg if needed for increase swelling Patient taking differently: Take 40 mg by mouth 2 (two) times daily. 11/09/20  Yes Leonie Man, MD  NON FORMULARY Pt uses a cpap nightly    [provider]  ondansetron (ZOFRAN-ODT) 4  MG disintegrating tablet Take 1 tablet (4 mg total) by mouth every 6 (six) hours as needed for nausea. Patient not taking: Reported on 01/30/2023 09/03/22   Virl Cagey, MD  oxyCODONE (OXY IR/ROXICODONE) 5 MG immediate release tablet Take 1 tablet (5 mg total) by mouth every 4 (four) hours as needed for severe pain or breakthrough pain. Patient not taking: Reported on 01/30/2023 09/03/22   Virl Cagey, MD  Spacer/Aero-Holding Chambers (AEROCHAMBER MV) inhaler Use as instructed 04/12/22   Maryjane Hurter, MD    Physical Exam: Vitals:   01/30/23 0746 01/30/23 0748 01/30/23 0830 01/30/23 0900  BP:  139/79 130/78 133/70  Pulse:  85 84 87  Resp:  14 13 (!) 23  Temp:  97.8 F (36.6 C)    TempSrc:  Oral    SpO2:  95% 93% 96%  Weight: 128.8 kg     Height: '5\' 9"'$  (1.753 m)      General exam: Alert, awake, oriented x 3; in no major distress; denying chest pain.  Patient expressed to be short of breath with activity. Respiratory system: Fine crackles  appreciated at the bases; no wheezing, no using accessory muscles. Cardiovascular system: Rate controlled, no rubs, no gallops, unable to assess JVD with body habitus. Gastrointestinal system: Abdomen is obese, nondistended, soft and nontender. No organomegaly or masses felt. Normal bowel sounds heard. Central nervous system: Alert and oriented. No focal neurological deficits. Extremities: No cyanosis or clubbing; trace to 1+ edema bilaterally (difficult evaluation from baseline secondary to obesity). Skin: No petechiae. Psychiatry: Judgement and insight appear normal. Mood & affect appropriate.    Data Reviewed: CBC: WBC 7.5, hemoglobin 14.6 and platelet count 200 K Comprehensive metabolic panel: Sodium 0000000, potassium 2.8, chloride 93, bicarb 32, BUN 22 and creatinine 1.35 High sensitive troponin: 6>>> 5 BNP: 108 (normally 30's to 80's range); overall normal sensitive in a patient with obesity.  Assessment and Plan: * Acute on chronic diastolic HF (heart failure) (HCC) -Patient presenting with over 10 pounds weight gain since his last office visit -Dyspnea on exertion, increased swelling in his lower extremities and orthopnea has been reported -BNP elevated -Patient will be admitted to telemetry -2D echo will be updated -Follow Reds clip measurements -Daily weight, low-sodium diet and strict intake and output -Started on IV diuretics and will follow clinical response.   -Cardiology service was consulted by EDP and will follow any further recommendations.  Hypokalemia -Electrolytes will be repleted telemetry has been ordered -Checking magnesium and phosphorus level -Patient using daily supplementation at home.  Permanent atrial fibrillation ([place]): CHA2DS2Vasc = 5. Eliquis -Rate controlled currently -Continue the use of diltiazem and, metoprolol and Eliquis -Telemetry monitoring will be following -Close monitoring of patient's electrolytes with repletion as needed will be  provided.  Hyperlipidemia associated with type 2 diabetes mellitus ([place]) -Continue statin  Morbid obesity ([place]) -Body mass index is 41.94 kg/m. -Low-calorie diet, portion control and increase physical activity discussed with patient.  Essential hypertension -Stable overall -Continue home antihypertensive agents and currently IV diuresis -Heart healthy/low-sodium diet discussed with patient -Follow-up vital signs.  OSA and COPD overlap syndrome (HCC) -Continue the use of CPAP nightly   Diabetes mellitus type II, non insulin dependent (HCC) -Update A1c -Sliding scale insulin will be order if CBGs more than 200.. -Patient was just following modified carbohydrate diet as an outpatient.  COPD (chronic obstructive pulmonary disease) (HCC) -No wheezing currently appreciated -Continue home bronchodilator management and supportive care. -Patient shortness of breath symptoms secondary  to fluid overload and acute CHF exacerbation.    Advance Care Planning:   Code Status: Full Code   Consults: Cardiology service  Family Communication: No family at bedside  Severity of Illness: The appropriate patient status for this patient is INPATIENT. Inpatient status is judged to be reasonable and necessary in order to provide the required intensity of service to ensure the patient's safety. The patient's presenting symptoms, physical exam findings, and initial radiographic and laboratory data in the context of their chronic comorbidities is felt to place them at high risk for further clinical deterioration. Furthermore, it is not anticipated that the patient will be medically stable for discharge from the hospital within 2 midnights of admission.   * I certify that at the point of admission it is my clinical judgment that the patient will require inpatient hospital care spanning beyond 2 midnights from the point of admission due to high intensity of service, high risk for further deterioration and  high frequency of surveillance required.*  Author: Barton Dubois, MD 01/30/2023 11:02 AM  For on call review [email or web address].

## 2023-01-30 NOTE — Assessment & Plan Note (Addendum)
-  Continue the use of CPAP nightly -Education on importance for compliance discussed with patient.

## 2023-01-31 ENCOUNTER — Other Ambulatory Visit (HOSPITAL_COMMUNITY): Payer: Self-pay

## 2023-01-31 ENCOUNTER — Inpatient Hospital Stay (HOSPITAL_COMMUNITY): Payer: Medicare HMO

## 2023-01-31 ENCOUNTER — Telehealth (HOSPITAL_COMMUNITY): Payer: Self-pay

## 2023-01-31 DIAGNOSIS — J449 Chronic obstructive pulmonary disease, unspecified: Secondary | ICD-10-CM | POA: Diagnosis not present

## 2023-01-31 DIAGNOSIS — I4821 Permanent atrial fibrillation: Secondary | ICD-10-CM | POA: Diagnosis not present

## 2023-01-31 DIAGNOSIS — I5033 Acute on chronic diastolic (congestive) heart failure: Secondary | ICD-10-CM | POA: Diagnosis not present

## 2023-01-31 DIAGNOSIS — I5031 Acute diastolic (congestive) heart failure: Secondary | ICD-10-CM

## 2023-01-31 HISTORY — PX: TRANSTHORACIC ECHOCARDIOGRAM: SHX275

## 2023-01-31 LAB — BASIC METABOLIC PANEL
Anion gap: 9 (ref 5–15)
BUN: 21 mg/dL (ref 8–23)
CO2: 30 mmol/L (ref 22–32)
Calcium: 8.4 mg/dL — ABNORMAL LOW (ref 8.9–10.3)
Chloride: 100 mmol/L (ref 98–111)
Creatinine, Ser: 1.33 mg/dL — ABNORMAL HIGH (ref 0.61–1.24)
GFR, Estimated: 54 mL/min — ABNORMAL LOW (ref >60)
Glucose, Bld: 173 mg/dL — ABNORMAL HIGH (ref 70–99)
Potassium: 3.5 mmol/L (ref 3.5–5.1)
Sodium: 139 mmol/L (ref 135–145)

## 2023-01-31 LAB — ECHOCARDIOGRAM COMPLETE
AR max vel: 1.94 cm2
AV Area VTI: 1.91 cm2
AV Area mean vel: 1.91 cm2
AV Mean grad: 13 mmHg
AV Peak grad: 19.9 mmHg
Ao pk vel: 2.23 m/s
Area-P 1/2: 4.11 cm2
Height: 69 in
MV M vel: 2.29 m/s
MV Peak grad: 21 mmHg
S' Lateral: 3.2 cm
Weight: 4499.2 oz

## 2023-01-31 LAB — CBC
HCT: 42.8 % (ref 39.0–52.0)
Hemoglobin: 14.3 g/dL (ref 13.0–17.0)
MCH: 32.3 pg (ref 26.0–34.0)
MCHC: 33.4 g/dL (ref 30.0–36.0)
MCV: 96.6 fL (ref 80.0–100.0)
Platelets: 198 10*3/uL (ref 150–400)
RBC: 4.43 MIL/uL (ref 4.22–5.81)
RDW: 16.1 % — ABNORMAL HIGH (ref 11.5–15.5)
WBC: 7.8 10*3/uL (ref 4.0–10.5)
nRBC: 0 % (ref 0.0–0.2)

## 2023-01-31 LAB — HEMOGLOBIN A1C
Hgb A1c MFr Bld: 6.8 % — ABNORMAL HIGH (ref 4.8–5.6)
Mean Plasma Glucose: 148 mg/dL

## 2023-01-31 MED ORDER — FUROSEMIDE 10 MG/ML IJ SOLN
40.0000 mg | Freq: Once | INTRAMUSCULAR | Status: AC
  Administered 2023-01-31: 40 mg via INTRAVENOUS
  Filled 2023-01-31: qty 4

## 2023-01-31 MED ORDER — LIVING BETTER WITH HEART FAILURE BOOK: Freq: Once | Status: DC

## 2023-01-31 MED ORDER — PERFLUTREN LIPID MICROSPHERE
1.0000 mL | INTRAVENOUS | Status: AC | PRN
  Administered 2023-01-31: 2 mL via INTRAVENOUS

## 2023-01-31 MED ORDER — FUROSEMIDE 10 MG/ML IJ SOLN
80.0000 mg | Freq: Two times a day (BID) | INTRAMUSCULAR | Status: DC
  Administered 2023-01-31 – 2023-02-02 (×4): 80 mg via INTRAVENOUS
  Filled 2023-01-31 (×4): qty 8

## 2023-01-31 NOTE — Progress Notes (Signed)
  Echocardiogram 2D Echocardiogram has been performed.  Vincent Cooper 01/31/2023, 10:30 AM

## 2023-01-31 NOTE — TOC Initial Note (Signed)
Transition of Care [facility]) - Initial/Assessment Note    Patient Details  Name: Vincent Cooper MRN: [id number] Date of Birth: [birth year]  Transition of Care [facility]) CM/SW Contact:    Salome Arnt, [place] Phone Number: 01/31/2023, 9:16 AM  Clinical Narrative: Pt admitted due to acute on chronic diastolic heart failure. TOC received consult for CHF screening. Pt reports he lives alone and is independent with ADLs. He ambulates with a cane. Pt drives himself to appointments. He plans to return home when medically stable.  CHF screening completed with pt. Pt states he weighs himself daily and follows a heart healthy diet. He takes medications as prescribed. Will order Living Well with CHF book. Discussed home health RN or [facility] heart failure program and pt does not feel this is needed at this time. TOC will continue to follow.                   Expected Discharge Plan: Home/Self Care Barriers to Discharge: Continued Medical Work up   Patient Goals and CMS Choice Patient states their goals for this hospitalization and ongoing recovery are:: return home   Choice offered to / list presented to : Patient [place] ownership interest in [facility].provided to::  (n/a)    Expected Discharge Plan and Services In-house Referral: Clinical Social Work     Living arrangements for the past 2 months: Single Family Home                           HH Arranged: Refused HH          Prior Living Arrangements/Services Living arrangements for the past 2 months: Single Family Home Lives with:: Self Patient language and need for interpreter reviewed:: Yes Do you feel safe going back to the place where you live?: Yes      Need for Family Participation in Patient Care: No (Comment)   Current home services: DME (cane) Criminal Activity/Legal Involvement Pertinent to Current Situation/Hospitalization: No - Comment as needed  Activities of Daily Living Home Assistive  Devices/Equipment: Cane (specify quad or straight) ADL Screening (condition at time of admission) Patient's cognitive ability adequate to safely complete daily activities?: Yes Is the patient deaf or have difficulty hearing?: Yes Does the patient have difficulty seeing, even when wearing glasses/contacts?: No Does the patient have difficulty concentrating, remembering, or making decisions?: No Patient able to express need for assistance with ADLs?: Yes Does the patient have difficulty dressing or bathing?: No Independently performs ADLs?: Yes (appropriate for developmental age) Does the patient have difficulty walking or climbing stairs?: No Weakness of Legs: None Weakness of Arms/Hands: None  Permission Sought/Granted                  Emotional Assessment     Affect (typically observed): Appropriate Orientation: : Oriented to Self, Oriented to Place, Oriented to  Time, Oriented to Situation Alcohol / Substance Use: Not Applicable Psych Involvement: No (comment)  Admission diagnosis:  Acute on chronic diastolic HF (heart failure) (HCC) 123456 Systolic congestive heart failure, unspecified HF chronicity (HCC) [I50.20] Patient Active Problem List   Diagnosis Date Noted   Acute on chronic diastolic HF (heart failure) ([place]) 01/30/2023   SBO (small bowel obstruction) ([place])    Ventral hernia without obstruction or gangrene    Permanent atrial fibrillation ([place]): CHA2DS2Vasc = 5. 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) 07/11/2020   Bowel perforation ([place]) 06/21/2020   Free intraperitoneal air    Cellulitis, leg 06/17/2020   AKI (acute kidney injury) ([place]) 06/17/2020   Coronary artery disease, non-occlusive 03/20/2020   Hyperlipidemia  associated with type 2 diabetes mellitus ([place]) 03/20/2020   Acute respiratory failure with hypoxia (HCC)    Bronchiectasis with acute exacerbation (HCC)    Chronic diastolic HF (heart failure) ([place])    Shortness of breath 01/19/2020   Morbid obesity ([place]) 01/19/2020   COPD with acute exacerbation ([place]) 01/19/2020   Cardiac murmur 11/07/2019   COPD (chronic obstructive pulmonary disease) ([place]) 11/07/2019   Diabetes mellitus type II, non insulin dependent ([place]) 11/07/2019   OSA and COPD overlap syndrome ([place]) 11/07/2019   Essential hypertension 11/07/2019   Single subsegmental pulmonary embolism without acute cor pulmonale ([place]) 11/06/2019   PCP:  Manon Hilding, MD Pharmacy:   [facility], [place] [address] [place] [address] Phone: [phone] Fax: [facility], [place] [address] [place] [address] Phone: [phone] Fax: [phone]  [place] [facility], [address] [place] [address] [place] [address] Phone: [phone] Fax: [place] [address] Phone: [phone] Fax: [phone]     Social Determinants of Health (SDOH) Social History: SDOH Screenings   Food Insecurity: No Food Insecurity (09/02/2022)  Housing: Low Risk  (09/02/2022)  Transportation Needs: No Transportation Needs (09/02/2022)  Utilities: Not At Risk (09/02/2022)  Depression (PHQ2-9): Low Risk  (06/03/2022)  Tobacco Use: Medium Risk (01/30/2023)   SDOH Interventions:     Readmission Risk Interventions    06/23/2020    3:14 PM  Readmission Risk Prevention Plan  Transportation Screening Complete  HRI or Home Care Consult Complete  Palliative Care Screening Not Applicable  Medication Review (RN Care Manager) Complete

## 2023-01-31 NOTE — Telephone Encounter (Signed)
Pharmacy Patient Advocate Encounter  Insurance verification completed.    The patient is insured through [employer]   The patient is currently admitted and ran test claims for the following: Farxiga, Jardiance.  Copays and coinsurance results were relayed to Inpatient clinical team.  

## 2023-01-31 NOTE — Care Management Important Message (Signed)
Important Message  Patient Details  Name: Vincent Cooper MRN: [id number] Date of Birth: Apr 14, 1941   Medicare Important Message Given:  Yes     Tommy Medal 01/31/2023, 1:59 PM

## 2023-01-31 NOTE — TOC Benefit Eligibility Note (Signed)
Patient Teacher, English as a foreign language completed.    The patient is currently admitted and upon discharge could be taking Jardiance.  The current 30 day co-pay is $0.00.   The patient is currently admitted and upon discharge could be taking [place].  The current 30 day co-pay is $0.00.   The patient is insured through [employer]

## 2023-01-31 NOTE — Progress Notes (Signed)
Progress Note   Patient: Vincent Cooper [id number] DOB: 07/26/1941 DOA: 01/30/2023     1 DOS: the patient was seen and examined on 01/31/2023   Brief hospital course: Bernhardt Alzamora is a 82 y.o. 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 Patient reported symptom has been present for the last 5 days or so and worsening; he has contacted PCP who adjusted his diuretic regimen which shows partial improvement in his symptoms initially and no longer working.  Patient reports no chest pain, no nausea or vomiting; there has not been any diaphoresis, hematuria, focal weaknesses, sick contacts or any other complaints.   Chest x-ray demonstrating vascular congestion without pulmonary edema; elevated BNP and a physical exam positive crackles and increased lower extremity swelling.    IV Lasix x 1 given in the ED along with electrolyte repletion for his potassium.  Cardiology service was consulted and TRH contacted to place patient in the hospital for further evaluation and management.  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 -BNP elevated and vascular congestion appreciated on chest x-ray. -2D echo demonstrating preserved ejection fraction and no wall motion abnormalities -Reds clip measurements still elevated at 41.5 -Continue to follow daily weights, strict I's and O's and low-sodium diet -Increase IV Lasix and follow urine output/clinical  response.  Hypokalemia -Repleted -Continue to follow trend and further replete as needed.  Permanent atrial fibrillation ([place]): CHA2DS2Vasc = 5. Eliquis -Rate controlled currently -Continue the use of diltiazem and, metoprolol and Eliquis -Continue telemetry monitoring  -Close monitoring of patient's electrolytes with repletion as needed will be provided. -Potassium goal above 4 and magnesium above 2 as much as possible.  Hyperlipidemia associated with type 2 diabetes mellitus ([place]) -Continue statin  Morbid obesity ([place]) -Body mass index is 41.94 kg/m. -Low-calorie diet, portion control and increase physical activity discussed with patient.  Essential hypertension -Stable overall -Continue home antihypertensive agents and currently IV diuresis -Heart healthy/low-sodium diet discussed with patient -Follow-up vital signs.  OSA and COPD overlap syndrome (HCC) -Continue the use of CPAP nightly -Education on importance for compliance discussed with patient.   Diabetes mellitus type II, non insulin dependent (HCC) -A1c 6.8 currently -Sliding scale insulin will be order if CBGs more than 200.. -Patient was just following modified carbohydrate diet as an outpatient. -Continue to follow CBGs fluctuation.  COPD (chronic obstructive pulmonary disease) (HCC) -No wheezing currently appreciated -Continue home bronchodilator management and supportive care. -Patient shortness of breath symptoms secondary to fluid overload and acute CHF exacerbation.    Subjective:  Expressing short winded sensation with activity; complaining of orthopnea and demonstrating signs of fluid overload during examination with positive crackles and lower extremity swelling  Physical Exam: Vitals:   01/30/23 2107 01/31/23 0511 01/31/23 0913 01/31/23 1258  BP: 126/70 (!) 109/97  108/77  Pulse: 81 85  64  Resp: 20 20  18   Temp: 98.3 F (36.8 C) 97.7 F (36.5 C)  98 F (36.7 C)  TempSrc: Oral Oral  Oral   SpO2:  94% 94% 99%  Weight:  127.6 kg  Height:       General exam: Alert, awake, oriented x 3; expressing short winded sensation with activity and complaining of orthopnea.  Positive fluid overload on examination. Respiratory system: Decreased breath sounds at the bases; positive crackles, no using accessory muscle.  Good saturation on room air appreciated at rest. Cardiovascular system: Rate controlled, no rubs, no gallops, not able to properly assess JVD with body habitus. Gastrointestinal system: Abdomen is obese, no tenderness on palpation.  Positive bowel sounds.  No guarding. Central nervous system: Alert and oriented. No focal neurological deficits. Extremities: No cyanosis or clubbing; 1+ edema appreciated bilaterally. Skin: No petechiae. Psychiatry: Judgement and insight appear normal. Mood & affect appropriate.   Data Reviewed: Reds clip measurement: [id number] Basic metabolic panel: sodium [id number], potassium 3.5, chloride 100, bicarb 30, BUN 21 and creatinine 1.33; GFR 54 CBC: WBC 7.8, hemoglobin 14.3 and platelet count 198 K  Family Communication: Daughter at bedside.  Disposition: Status is: Inpatient Remains inpatient appropriate because: Still complaining of orthopnea, short winded sensation with activity and signs of fluid overload.  Continue IV diuresis.   Planned Discharge Destination: Home    Time spent: 35 minutes  Author: Barton Dubois, MD 01/31/2023 6:26 PM  For on call review [email or web address].

## 2023-01-31 NOTE — Progress Notes (Addendum)
Rounding Note    Patient Name: Vincent Cooper Date of Encounter: 01/31/2023  [place] Cardiologist: Glenetta Hew, MD   Subjective   Patient seen on AM rounds. Denies any chest pain or worsening shortness of breath. Remains on room air. Urine output has  only been measures in occurences.Patient states that he feels like he has voided an increased amount of times since arriving. He was able to sleep last evening.   Inpatient Medications    Scheduled Meds:  apixaban  2.5 mg Oral BID   diltiazem  360 mg Oral BH-q7a   fluticasone furoate-vilanterol  1 puff Inhalation Daily   And   umeclidinium bromide  1 puff Inhalation Daily   furosemide  40 mg Intravenous Q12H   metoprolol tartrate  25 mg Oral BID   potassium chloride  40 mEq Oral Daily   rosuvastatin  40 mg Oral Daily   sodium chloride flush  3 mL Intravenous Q12H   tamsulosin  0.4 mg Oral QHS   Continuous Infusions:  sodium chloride     PRN Meds: sodium chloride, acetaminophen **OR** acetaminophen, ipratropium-albuterol, ondansetron **OR** ondansetron (ZOFRAN) IV, polyvinyl alcohol, sodium chloride flush   Vital Signs    Vitals:   01/30/23 1125 01/30/23 1504 01/30/23 2107 01/31/23 0511  BP: (!) 132/100  126/70 (!) 109/97  Pulse: 92 87 81 85  Resp: 20 18 20 20   Temp: 98 F (36.7 C)  98.3 F (36.8 C) 97.7 F (36.5 C)  TempSrc: Oral  Oral Oral  SpO2: 96% 94% 95% 94%  Weight:    127.6 kg  Height:       No intake or output data in the 24 hours ending 01/31/23 0840    01/31/2023    5:11 AM 01/30/2023    7:46 AM 01/13/2023    9:05 AM  Last 3 Weights  Weight (lbs) 281 lb 3.2 oz 284 lb 283 lb 9.6 oz  Weight (kg) 127.551 kg 128.822 kg 128.64 kg      Telemetry    Rate controlled atrial fibrillation rates 80-90 - Personally Reviewed  ECG    No new tracings - Personally Reviewed  Physical Exam   GEN: No acute distress.   Neck: unable to assess JVD due to body habitus Cardiac: irregularly  irregular, I/VI murmurs heart tones are distant, without rubs, or gallops.  Respiratory: Clear with slightly diminished bases to auscultation bilaterally. Respirations are unlabored at rest on room air GI: Soft, nontender, obese, non-distended  MS: 1+ pitting edema to BLE; No deformity. Skin ir dry and scaly.  Neuro:  Nonfocal  Psych: Normal affect   Labs    High Sensitivity Troponin:   Recent Labs  Lab 01/30/23 0748 01/30/23 0952  TROPONINIHS 6 5     Chemistry Recent Labs  Lab 01/30/23 0748 01/30/23 0952 01/31/23 0507  NA 137  --  139  K 2.8*  --  3.5  CL 93*  --  100  CO2 32  --  30  GLUCOSE 186*  --  173*  BUN 22  --  21  CREATININE 1.35*  --  1.33*  CALCIUM 8.5*  --  8.4*  MG  --  2.0  --   PROT 6.7  --   --   ALBUMIN 3.3*  --   --   AST 22  --   --   ALT 18  --   --   ALKPHOS 50  --   --   BILITOT 1.4*  --   --  GFRNONAA 53*  --  54*  ANIONGAP 12  --  9    Lipids No results for input(s): "CHOL", "TRIG", "HDL", "LABVLDL", "LDLCALC", "CHOLHDL" in the last 168 hours.  Hematology Recent Labs  Lab 01/30/23 0748 01/31/23 0507  WBC 7.5 7.8  RBC 4.49 4.43  HGB 14.6 14.3  HCT 41.6 42.8  MCV 92.7 96.6  MCH 32.5 32.3  MCHC 35.1 33.4  RDW 15.9* 16.1*  PLT 200 198   Thyroid  Recent Labs  Lab 01/30/23 0952  TSH 3.583    BNP Recent Labs  Lab 01/30/23 0748  BNP 108.0*    DDimer No results for input(s): "DDIMER" in the last 168 hours.   Radiology    DG Chest Port 1 View  Result Date: 01/30/2023 CLINICAL DATA:  Shortness of breath. EXAM: PORTABLE CHEST 1 VIEW COMPARISON:  02/03/2022 FINDINGS: The heart is borderline enlarged but stable. Stable tortuosity and calcification of the thoracic aorta. The lungs are clear of an acute process. No infiltrates, edema or effusions. No pulmonary lesions or pneumothorax. The bony thorax is intact. IMPRESSION: Borderline cardiac enlargement but no acute pulmonary findings. Electronically Signed   By: Marijo Sanes M.D.    On: 01/30/2023 08:25    Cardiac Studies   Echocardiogram ordered and pending  Patient Profile     82 y.o. 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  Assessment & Plan    Acute on chronic diastolic congestive heart failure -presented with complaints of shortness of breath, orthopnea and PND -recent weight gain with PCP increasing diuretics -BNP 108 -LVEF 60-65% in 2021 -echocardiogram ordered and pending -continued on furosemide 40 mg IV BID -daily bmp -previous coronary calcium score of 1600, will need outpt follow up with primary cardiologist for [facility] -would benefit from SGLT2i with history of DM, can be started prior to dc or at follow up -consider spirolactone 12.5 mg daily -continue heart failure education -daily weight, I's&O's, Low sodium diet  Permanent atrial fibrillation -remains in atrial fibrillation -continued on diltiazem 360 mg daily and metoprolol tartrate 25 mg twice daily -continued on apixaban 2.5 mg twice daily for CHADS2-VASc score of at least 6 -continue on telemetry monitor  Hypokalemia  -resolved this morning -serum potassium 3.5 -on arrival 2.8 with supplementation given -daily bmp -monitor/trend/replete electrolytes as needed  OSA -currently maintaining O2 sats on room air -refused cpap last evening  HTN -blood pressure 109/97 -continued on diltiazem, metoprolol tartrate 25 mg twice daily, furosemide -vital signs per unit protocol  HLD -continues on rosuvastatin 40 mg daily      CHA2DS2-VASc Score = 6   This indicates a 9.7% annual risk of stroke. The patient's score is based upon: CHF History: 1 HTN History: 1 Diabetes History: 1 Stroke History: 0 Vascular Disease History: 1 Age Score: 2 Gender Score: 0     For questions or  updates, please contact [place] Please consult [email or web address] for contact info under        Signed, SHERI HAMMOCK, NP  01/31/2023, 8:40 AM     Attending attestation  Patient seen and independently examined with Gerrie Nordmann, NP. We discussed all aspects of the encounter. I agree with the assessment and plan as stated above.  No acute events overnight. In and outs are not accurately charted. Patient lost 1  kg since yesterday after starting IV Lasix 40 mg twice daily. He reported urinating more than at home.  Vitals, pulse rate 81-92, RR 13-23, BP 109-139/79-97 mmHg, SpO2 93 to 97%.  Face examination noted, patient not in any acute respite distress, clear lungs, S1-S2 heard, JVD not examined due to body habitus, soft and nontender abdomen, 1+ pitting edema in bilateral lower extremities.Labs remarkable for serum creatinine 1.33 (stable from 1.35 yesterday), sodium 139, K3.5, chloride 100, CO2 30.  # Acute on chronic diastolic heart failure exacerbation -281 pounds today, dry weight 270 pounds. Increase IV Lasix from 40 to 80 mg twice daily -Heart failure exacerbation being anginal:, He will need to follow-up with outpatient cardiology if he will benefit from [facility] due to elevated coronary calcium score of 1600.  # Permanent A-fib, rate controlled Telemetry reviewed, HR between 80 to 90s, rates elevated to 120s very briefly and in atrial fibrillation -Continue diltiazem 360 mg once daily -Continue Eliquis 2.5 mg twice daily  I have spent a total of 33 minutes with patient reviewing chart , telemetry, EKGs, labs and examining patient as well as establishing an assessment and plan that was discussed with the patient.  > 50% of time was spent in direct patient care.    Jeanice Dempsey Fidel Levy, MD [place]  [facility]  11:19 AM

## 2023-02-01 DIAGNOSIS — J449 Chronic obstructive pulmonary disease, unspecified: Secondary | ICD-10-CM | POA: Diagnosis not present

## 2023-02-01 DIAGNOSIS — I4821 Permanent atrial fibrillation: Secondary | ICD-10-CM | POA: Diagnosis not present

## 2023-02-01 DIAGNOSIS — I5033 Acute on chronic diastolic (congestive) heart failure: Secondary | ICD-10-CM | POA: Diagnosis not present

## 2023-02-01 LAB — BASIC METABOLIC PANEL
Anion gap: 11 (ref 5–15)
BUN: 21 mg/dL (ref 8–23)
CO2: 29 mmol/L (ref 22–32)
Calcium: 8.1 mg/dL — ABNORMAL LOW (ref 8.9–10.3)
Chloride: 98 mmol/L (ref 98–111)
Creatinine, Ser: 1.32 mg/dL — ABNORMAL HIGH (ref 0.61–1.24)
GFR, Estimated: 55 mL/min — ABNORMAL LOW (ref >60)
Glucose, Bld: 157 mg/dL — ABNORMAL HIGH (ref 70–99)
Potassium: 3.3 mmol/L — ABNORMAL LOW (ref 3.5–5.1)
Sodium: 138 mmol/L (ref 135–145)

## 2023-02-01 MED ORDER — METOLAZONE 5 MG PO TABS
2.5000 mg | ORAL_TABLET | Freq: Once | ORAL | Status: AC
  Administered 2023-02-01: 2.5 mg via ORAL
  Filled 2023-02-01: qty 1

## 2023-02-01 MED ORDER — POTASSIUM CHLORIDE CRYS ER 20 MEQ PO TBCR
40.0000 meq | EXTENDED_RELEASE_TABLET | Freq: Two times a day (BID) | ORAL | Status: DC
  Administered 2023-02-01 (×2): 40 meq via ORAL
  Filled 2023-02-01 (×2): qty 2

## 2023-02-01 NOTE — Progress Notes (Signed)
Progress Note   Patient: Vincent Cooper [id number] DOB: 12/29/1940 DOA: 01/30/2023     2 DOS: the patient was seen and examined on 02/01/2023   Brief hospital course: Vincent Cooper is a 82 y.o. 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 Patient reported symptom has been present for the last 5 days or so and worsening; he has contacted PCP who adjusted his diuretic regimen which shows partial improvement in his symptoms initially and no longer working.  Patient reports no chest pain, no nausea or vomiting; there has not been any diaphoresis, hematuria, focal weaknesses, sick contacts or any other complaints.   Chest x-ray demonstrating vascular congestion without pulmonary edema; elevated BNP and a physical exam positive crackles and increased lower extremity swelling.    IV Lasix x 1 given in the ED along with electrolyte repletion for his potassium.  Cardiology service was consulted and [facility] contacted to place patient in the hospital for further evaluation and management.  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 -BNP elevated and vascular congestion appreciated on chest x-ray. -2D echo demonstrating preserved ejection fraction and no wall motion abnormalities -Reds clip measurements still elevated at 41.5 -Continue to follow daily weights, strict I's and O's and low-sodium diet -Increase IV Lasix and follow urine output/clinical  response.  Hypokalemia -Repleted -Continue to follow trend and further replete as needed.  Permanent atrial fibrillation ([place]): CHA2DS2Vasc = 5. Eliquis -Rate controlled currently -Continue the use of diltiazem and, metoprolol and Eliquis -Continue telemetry monitoring  -Close monitoring of patient's electrolytes with repletion as needed will be provided. -Potassium goal above 4 and magnesium above 2 as much as possible.  Hyperlipidemia associated with type 2 diabetes mellitus ([place]) -Continue statin  Morbid obesity ([place]) -Body mass index is 41.94 kg/m. -Low-calorie diet, portion control and increase physical activity discussed with patient.  Essential hypertension -Stable overall -Continue home antihypertensive agents and currently IV diuresis -Heart healthy/low-sodium diet discussed with patient -Follow-up vital signs.  OSA and COPD overlap syndrome (HCC) -Continue the use of CPAP nightly -Education on importance for compliance discussed with patient.   Diabetes mellitus type II, non insulin dependent (HCC) -A1c 6.8 currently -Sliding scale insulin will be order if CBGs more than 200.. -Patient was just following modified carbohydrate diet as an outpatient. -Continue to follow CBGs fluctuation.  COPD (chronic obstructive pulmonary disease) (HCC) -No wheezing currently appreciated -Continue home bronchodilator management and supportive care. -Patient shortness of breath symptoms secondary to fluid overload and acute CHF exacerbation.    Subjective:  Reports breathing is getting better; has also noticed decreased in the swelling of his legs.  Still short winded with activity and expressing mild orthopnea.  Good urine output reported overnight.  Physical Exam: Vitals:   02/01/23 0300 02/01/23 0541 02/01/23 0825 02/01/23 1300  BP: 125/77 115/80  130/77  Pulse: 68 75  67  Resp: 18   20  Temp: 97.8 F (36.6 C)   97.8 F (36.6 C)  TempSrc: Oral     SpO2: 96%  94%  100%  Weight:  126.9  kg    Height:       General exam: Alert, awake, oriented x 3; reports feeling better and breathing easier; still short winded with activity and expressing mild orthopnea.  Good urine output reported with higher dose of Lasix. Respiratory system: No wheezing, no using accessory muscles; decreased breath sounds at the bases. Cardiovascular system: Rate controlled; no rubs or gallops.  Unable to assess JVD with body habitus. Gastrointestinal system: Abdomen is obese, nondistended, soft and nontender. No organomegaly or masses felt. Normal bowel sounds heard. Central nervous system: Alert and oriented. No focal neurological deficits. Extremities: No cyanosis or clubbing; trace to 1+ edema appreciated Skin: No petechiae. Psychiatry: Judgement and insight appear normal. Mood & affect appropriate.   Data Reviewed: Reds clip measurement: 0000000 Basic metabolic panel: sodium 1 0000000, potassium 3.3, chloride 98, bicarb 29, BUN 21, creatinine 1.32 and GFR 55. CBC: WBC 7.8, hemoglobin 14.3 and platelet count 198 K  Family Communication: Daughter at bedside.  Disposition: Status is: Inpatient Remains inpatient appropriate because: Still complaining of orthopnea, short winded sensation with activity and signs of fluid overload.  Continue IV diuresis.   Planned Discharge Destination: Home     Time spent: 35 minutes  Author: Barton Dubois, MD 02/01/2023 4:32 PM  For on call review [email or web address].

## 2023-02-01 NOTE — Plan of Care (Signed)
Discussed with patient plan of care for the evening, pain management and wearing non-skid socks when ambulating with some teach back displayed.  Problem: Education: Goal: Knowledge of General Education information will improve Description: Including pain rating scale, medication(s)/side effects and non-pharmacologic comfort measures Outcome: Progressing   Problem: Health Behavior/Discharge Planning: Goal: Ability to manage health-related needs will improve Outcome: Progressing

## 2023-02-01 NOTE — Progress Notes (Signed)
   02/01/23 1230  ReDS Vest / Clip  Station Marker D  Ruler Value 47  ReDS Value Range < 36  ReDS Actual Value 26

## 2023-02-02 DIAGNOSIS — E876 Hypokalemia: Secondary | ICD-10-CM | POA: Diagnosis not present

## 2023-02-02 DIAGNOSIS — I5033 Acute on chronic diastolic (congestive) heart failure: Secondary | ICD-10-CM | POA: Diagnosis not present

## 2023-02-02 DIAGNOSIS — G4733 Obstructive sleep apnea (adult) (pediatric): Secondary | ICD-10-CM | POA: Diagnosis not present

## 2023-02-02 LAB — BASIC METABOLIC PANEL
Anion gap: 14 (ref 5–15)
BUN: 26 mg/dL — ABNORMAL HIGH (ref 8–23)
CO2: 30 mmol/L (ref 22–32)
Calcium: 9.1 mg/dL (ref 8.9–10.3)
Chloride: 91 mmol/L — ABNORMAL LOW (ref 98–111)
Creatinine, Ser: 1.32 mg/dL — ABNORMAL HIGH (ref 0.61–1.24)
GFR, Estimated: 55 mL/min — ABNORMAL LOW (ref >60)
Glucose, Bld: 167 mg/dL — ABNORMAL HIGH (ref 70–99)
Potassium: 2.7 mmol/L — CL (ref 3.5–5.1)
Sodium: 135 mmol/L (ref 135–145)

## 2023-02-02 MED ORDER — POTASSIUM CHLORIDE CRYS ER 20 MEQ PO TBCR
40.0000 meq | EXTENDED_RELEASE_TABLET | Freq: Three times a day (TID) | ORAL | Status: DC
  Administered 2023-02-02: 40 meq via ORAL
  Filled 2023-02-02: qty 2

## 2023-02-02 MED ORDER — DILTIAZEM HCL ER COATED BEADS 180 MG PO CP24
360.0000 mg | ORAL_CAPSULE | Freq: Every day | ORAL | Status: DC
  Administered 2023-02-02: 360 mg via ORAL
  Filled 2023-02-02: qty 2

## 2023-02-02 MED ORDER — TORSEMIDE 20 MG PO TABS: 40.0000 mg | ORAL_TABLET | Freq: Two times a day (BID) | ORAL | 2 refills | Status: DC

## 2023-02-02 MED ORDER — SPIRONOLACTONE 25 MG PO TABS: 12.5000 mg | ORAL_TABLET | Freq: Every day | ORAL | 2 refills | Status: DC

## 2023-02-02 MED ORDER — DILTIAZEM HCL ER COATED BEADS 360 MG PO CP24: 360.0000 mg | ORAL_CAPSULE | Freq: Every day | ORAL | 2 refills | Status: DC

## 2023-02-02 NOTE — Discharge Summary (Signed)
Physician Discharge Summary   Patient: Vincent Cooper MRN: [id number] DOB: Aug 26, 1941  Admit date:     01/30/2023  Discharge date: 02/02/23  Discharge Physician: Barton Dubois   PCP: Manon Hilding, MD   Recommendations at discharge:  Repeat basic metabolic panel to follow electrolytes and renal function Continue assisting patient with weight management Reassess blood pressure and volume status with further adjustment to he has antihypertensive and diuretics treatment as required. Make sure patient follow-up with cardiology service as instructed. Repeat CBC to follow hemoglobin trend/stability.  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 Eliquis   Hypokalemia  Hospital Course: Vincent Cooper is a 82 y.o. 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 Patient reported symptom has been present for the last 5 days or so and worsening; he has contacted PCP who adjusted his diuretic regimen which shows partial improvement in his symptoms initially and no longer working.  Patient reports no chest pain, no nausea or vomiting; there has not been any diaphoresis, hematuria, focal weaknesses, sick contacts or any other complaints.   Chest x-ray demonstrating vascular congestion without  pulmonary edema; elevated BNP and a physical exam positive crackles and increased lower extremity swelling.    IV Lasix x 1 given in the ED along with electrolyte repletion for his potassium.  Cardiology service was consulted and [facility] contacted to place patient in the hospital for further evaluation and management.  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 -BNP elevated and vascular congestion appreciated on chest x-ray. -2D echo demonstrating preserved ejection fraction and no wall motion abnormalities. -Reds clip measurements improved and below 36 at time of discharge. -Excellent response to aggressive diuresis; electrolytes and renal function stable at time of discharge -Patient will go home on torsemide 40 mg twice a day and added low-dose Aldactone on daily basis. -Instructed to follow low-sodium diet and to continue checking weight on daily basis. -Patient will follow-up with cardiology and PCP as an outpatient.  Hypokalemia -Repleted -Continue daily supplementation -Added spironolactone will also help regulating/stabilizing potassium levels.  Permanent atrial fibrillation ([place]): CHA2DS2Vasc = 5. Eliquis -Rate controlled currently -Continue the use of diltiazem and, metoprolol and continue the use of Eliquis for secondary prevention. -Continue to closely follow patient's electrolytes level with goal for potassium above 4 and magnesium above 2.  Hyperlipidemia associated with type 2 diabetes mellitus ([place]) -Continue statin  Morbid obesity ([place]) -Body mass index is 41.94 kg/m. -Low-calorie diet, portion control and increase physical activity discussed with patient.  Essential hypertension -Stable overall -Continue current adjusted antihypertensive agents and reassess  blood pressure stability during follow-up visit. -Heart healthy/low-sodium diet discussed with patient.  OSA and COPD overlap syndrome ([place]) -Continue the use  of CPAP nightly -Education on importance for compliance discussed with patient.   Diabetes mellitus type II, non insulin dependent (HCC) -A1c 6.8 currently -Continue modified carbohydrate diet and follow-up with PCP. -Continue to follow CBGs fluctuation.  COPD (chronic obstructive pulmonary disease) (HCC) -No wheezing currently appreciated -Continue bronchodilator management and supportive care. -Patient shortness of breath symptoms secondary to fluid overload and acute CHF exacerbation.   Consultants: Cardiology service Procedures performed: See below for x-ray reports; 2D echo demonstrating preserved ejection fraction and no wall motion abnormality. Disposition: Home Diet recommendation: Heart healthy/low-sodium, modified carbohydrate and low calorie diet.  DISCHARGE MEDICATION: Allergies as of 02/02/2023       Reactions   Ancef [cefazolin] Itching, Rash   Other    Patient reports he was allergic to something in an IV he was given but does not know what the substance was. As of 01/19/2020         Medication List     STOP taking these medications    ondansetron 4 MG disintegrating tablet Commonly known as: ZOFRAN-ODT   oxyCODONE 5 MG immediate release tablet Commonly known as: Oxy IR/ROXICODONE       TAKE these medications    AeroChamber MV inhaler Use as instructed   albuterol 108 (90 Base) MCG/ACT inhaler Commonly known as: VENTOLIN HFA Inhale 1 puff into the lungs every 6 (six) hours as needed for wheezing or shortness of breath.   apixaban 2.5 MG Tabs tablet Commonly known as: Eliquis Take 1 tablet (2.5 mg total) by mouth 2 (two) times daily.   Breztri Aerosphere 160-9-4.8 MCG/ACT Aero Generic drug: Budeson-Glycopyrrol-Formoterol Inhale 2 puffs into the lungs in the morning and at bedtime.    diltiazem 360 MG 24 hr capsule Commonly known as: CARDIZEM CD Take 1 capsule (360 mg total) by mouth daily. Start taking on: February 03, 2023 What changed:  medication strength when to take this   hydroxypropyl methylcellulose / hypromellose 2.5 % ophthalmic solution Commonly known as: ISOPTO TEARS / GONIOVISC Place 1 drop into both eyes as needed for dry eyes.   metoprolol tartrate 25 MG tablet Commonly known as: LOPRESSOR Take 1 tablet (25 mg total) by mouth 2 (two) times daily.   nitroGLYCERIN 0.4 MG SL tablet Commonly known as: NITROSTAT Place 0.4 mg under the tongue every 5 (five) minutes as needed for chest pain.   NON FORMULARY Pt uses a cpap nightly   Nucala 100 MG/ML Soaj Generic drug: Mepolizumab Inject 1 mL (100 mg total) into the skin every 28 (twenty-eight) days.   potassium chloride SA 20 MEQ tablet Commonly known as: KLOR-CON M Take 20 mEq by mouth daily.   rosuvastatin 40 MG tablet Commonly known as: CRESTOR TAKE 1 TABLET BY MOUTH ONCE DAILY.   spironolactone 25 MG tablet Commonly known as: Aldactone Take 0.5 tablets (12.5 mg total) by mouth daily.   tamsulosin 0.4 MG Caps capsule Commonly known as: FLOMAX Take 1 capsule (0.4 mg total) by mouth daily. What changed: when to take this   torsemide 20 MG tablet Commonly known as: DEMADEX Take 2 tablets (40 mg total) by mouth 2 (two) times daily.        Follow-up Information     Sasser, Silvestre Moment, MD. Schedule an appointment as soon as possible for a visit in 10 day(s).   Specialty: Family Medicine Contact information: [place] [id number] [phone]                Discharge Exam:  Filed Weights   01/31/23 0511 02/01/23 0541 02/02/23 0434  Weight: 127.6 kg 126.9 kg 123.3 kg   General exam: Alert, awake, oriented x 3; reports feeling significantly improved; very little winded sensation with activity reported; no orthopnea, continue to have good urine output and no LE edema.   Feeling ready to go home. Respiratory system: No wheezing, no frank crackles; no using accessory muscles and demonstrating good saturation on room air. Cardiovascular system: Rate controlled; no rubs or gallops.  Unable to assess JVD with body habitus. Gastrointestinal system: Abdomen is obese, nondistended, soft and nontender. No organomegaly or masses felt. Normal bowel sounds heard. Central nervous system: Alert and oriented. No focal neurological deficits. Extremities: No cyanosis or clubbing; no edema seen on today's exam. Skin: No petechiae. Psychiatry: Judgement and insight appear normal. Mood & affect appropriate.   Condition at discharge: Stable and improved.  The results of significant diagnostics from this hospitalization (including imaging, microbiology, ancillary and laboratory) are listed below for reference.   Imaging Studies: ECHOCARDIOGRAM COMPLETE  Result Date: 01/31/2023    ECHOCARDIOGRAM REPORT   Patient Name:   SALAHUDDIN SALDUTTI Date of Exam: 01/31/2023 Medical Rec #:  [id number]   Height:       69.0 in Accession #:    [id number]  Weight:       281.2 lb Date of Birth:  01-07-1941   BSA:          2.388 m Patient Age:    40 years    BP:           109/97 mmHg Patient Gender: M           HR:           77 bpm. Exam Location:  [name] Procedure: 2D Echo, Cardiac Doppler, Color Doppler and Intracardiac            Opacification Agent Indications:    CHF - Acute Diastolic  History:        Patient has prior history of Echocardiogram examinations, most                 recent 10/20/2020. CAD, COPD; Risk Factors:Sleep Apnea,                 Hypertension, Diabetes and Morbid obesity. Hx of pulmonary                 emboli.  Sonographer:    Eartha Inch Referring Phys: [phone] Vincenzina Jagoda  Sonographer Comments: Technically difficult study due to poor echo windows and patient is obese. Image acquisition challenging due to patient body habitus and Image acquisition challenging due to respiratory  motion. IMPRESSIONS  1. Left ventricular ejection fraction, by estimation, is 65 to 70%. The left ventricle has normal function. The left ventricle has no regional wall motion abnormalities. Left ventricular diastolic function could not be evaluated.  2. Right ventricular systolic function is normal. The right ventricular size is normal. There is normal pulmonary artery systolic pressure. The estimated right ventricular systolic pressure is 0000000 mmHg.  3. Left atrial size was mildly dilated.  4. Right atrial size was moderately dilated.  5. The mitral valve was not well visualized. Trivial mitral valve regurgitation. No evidence of mitral stenosis.  6. The aortic valve is calcified. There is moderate calcification of the aortic valve. Aortic valve regurgitation is not visualized. Mild aortic valve stenosis. Aortic valve area, by VTI measures 1.91 cm. Aortic valve mean gradient measures 13.0 mmHg. Aortic valve  Vmax measures 2.23 m/s.  7. The inferior vena cava is dilated in size with >50% respiratory variability, suggesting right atrial pressure of 8 mmHg. Comparison(s): Changes from prior study are noted. Mild AS is now present. FINDINGS  Left Ventricle: Left ventricular ejection fraction, by estimation, is 65 to 70%. The left ventricle has normal function. The left ventricle has no regional wall motion abnormalities. Definity contrast agent was given IV to delineate the left ventricular  endocardial borders. The left ventricular internal cavity size was normal in size. There is no left ventricular hypertrophy. Left ventricular diastolic function could not be evaluated due to atrial fibrillation. Left ventricular diastolic function could  not be evaluated. Right Ventricle: The right ventricular size is normal. Right vetricular wall thickness was not well visualized. Right ventricular systolic function is normal. There is normal pulmonary artery systolic pressure. The tricuspid regurgitant velocity is 2.21 m/s, and  with an assumed right atrial pressure of 8 mmHg, the estimated right ventricular systolic pressure is 0000000 mmHg. Left Atrium: Left atrial size was mildly dilated. Right Atrium: Right atrial size was moderately dilated. Pericardium: Trivial pericardial effusion is present. Mitral Valve: The mitral valve was not well visualized. Trivial mitral valve regurgitation. No evidence of mitral valve stenosis. Tricuspid Valve: The tricuspid valve is not well visualized. Tricuspid valve regurgitation is mild . No evidence of tricuspid stenosis. Aortic Valve: The aortic valve is calcified. There is moderate calcification of the aortic valve. Aortic valve regurgitation is not visualized. Mild aortic stenosis is present. Aortic valve mean gradient measures 13.0 mmHg. Aortic valve peak gradient measures 19.9 mmHg. Aortic valve area, by VTI measures 1.91 cm. Pulmonic Valve: The pulmonic valve was not well visualized. Pulmonic valve regurgitation is trivial. No evidence of pulmonic stenosis. Aorta: The aortic root and ascending aorta are structurally normal, with no evidence of dilitation. Venous: The inferior vena cava is dilated in size with greater than 50% respiratory variability, suggesting right atrial pressure of 8 mmHg. IAS/Shunts: No atrial level shunt detected by color flow Doppler.  LEFT VENTRICLE PLAX 2D LVIDd:         4.40 cm   Diastology LVIDs:         3.20 cm   LV e' medial:    6.85 cm/s LV PW:         0.90 cm   LV E/e' medial:  15.0 LV IVS:        0.90 cm   LV e' lateral:   9.68 cm/s LVOT diam:     2.30 cm   LV E/e' lateral: 10.6 LV SV:         89 LV SV Index:   37 LVOT Area:     4.15 cm  RIGHT VENTRICLE            IVC RV S prime:     8.81 cm/s  IVC diam: 2.40 cm TAPSE (M-mode): 1.5 cm LEFT ATRIUM             Index        RIGHT ATRIUM           Index LA diam:        3.80 cm 1.59 cm/m   RA Area:     27.65 cm LA Vol (A2C):   77.9 ml 32.62 ml/m  RA Volume:   90.50 ml  37.90 ml/m LA Vol (A4C):   79.9 ml 33.46 ml/m  LA Biplane Vol: 79.5 ml 33.29 ml/m  AORTIC VALVE AV Area (Vmax):  1.94 cm AV Area (Vmean):   1.91 cm AV Area (VTI):     1.91 cm AV Vmax:           223.00 cm/s AV Vmean:          173.000 cm/s AV VTI:            0.468 m AV Peak Grad:      19.9 mmHg AV Mean Grad:      13.0 mmHg LVOT Vmax:         104.00 cm/s LVOT Vmean:        79.700 cm/s LVOT VTI:          0.215 m LVOT/AV VTI ratio: 0.46  AORTA Ao Root diam: 3.30 cm Ao Asc diam:  3.10 cm MITRAL VALVE                TRICUSPID VALVE MV Area (PHT): 4.11 cm     TR Peak grad:   19.5 mmHg MV Decel Time: 185 msec     TR Vmax:        221.00 cm/s MR Peak grad: 21.0 mmHg MR Vmax:      229.00 cm/s   SHUNTS MV E velocity: 103.00 cm/s  Systemic VTI:  0.22 m                             Systemic Diam: 2.30 cm Vishnu Priya Mallipeddi Electronically signed by Lorelee Cover Mallipeddi Signature Date/Time: 01/31/2023/12:23:19 PM    Final    DG Chest Port 1 View  Result Date: 01/30/2023 CLINICAL DATA:  Shortness of breath. EXAM: PORTABLE CHEST 1 VIEW COMPARISON:  02/03/2022 FINDINGS: The heart is borderline enlarged but stable. Stable tortuosity and calcification of the thoracic aorta. The lungs are clear of an acute process. No infiltrates, edema or effusions. No pulmonary lesions or pneumothorax. The bony thorax is intact. IMPRESSION: Borderline cardiac enlargement but no acute pulmonary findings. Electronically Signed   By: Marijo Sanes M.D.   On: 01/30/2023 08:25    Microbiology: Results for orders placed or performed during the hospital encounter of 02/03/22  Resp Panel by RT-PCR (Flu A&B, Covid) Nasopharyngeal Swab     Status: None   Collection Time: 02/03/22  7:40 PM   Specimen: Nasopharyngeal Swab; Nasopharyngeal(NP) swabs in vial transport medium  Result Value Ref Range Status   SARS Coronavirus 2 by RT PCR NEGATIVE NEGATIVE Final    Comment: (NOTE) SARS-CoV-2 target nucleic acids are NOT DETECTED.  The SARS-CoV-2 RNA is generally detectable in upper  respiratory specimens during the acute phase of infection. The lowest concentration of SARS-CoV-2 viral copies this assay can detect is 138 copies/mL. A negative result does not preclude SARS-Cov-2 infection and should not be used as the sole basis for treatment or other patient management decisions. A negative result may occur with  improper specimen collection/handling, submission of specimen other than nasopharyngeal swab, presence of viral mutation(s) within the areas targeted by this assay, and inadequate number of viral copies(<138 copies/mL). A negative result must be combined with clinical observations, patient history, and epidemiological information. The expected result is Negative.  Fact Sheet for Patients:  EntrepreneurPulse.com.au  Fact Sheet for Healthcare Providers:  IncredibleEmployment.be  This test is no t yet approved or cleared by the [place] FDA and  has been authorized for detection and/or diagnosis of SARS-CoV-2 by FDA under an Emergency Use Authorization (EUA). This EUA will remain  in effect (meaning  this test can be used) for the duration of the COVID-19 declaration under Section 564(b)(1) of the Act, 21 U.S.C.section 360bbb-3(b)(1), unless the authorization is terminated  or revoked sooner.       Influenza A by PCR NEGATIVE NEGATIVE Final   Influenza B by PCR NEGATIVE NEGATIVE Final    Comment: (NOTE) The Xpert Xpress SARS-CoV-2/FLU/RSV plus assay is intended as an aid in the diagnosis of influenza from Nasopharyngeal swab specimens and should not be used as a sole basis for treatment. Nasal washings and aspirates are unacceptable for Xpert Xpress SARS-CoV-2/FLU/RSV testing.  Fact Sheet for Patients: EntrepreneurPulse.com.au  Fact Sheet for Healthcare Providers: IncredibleEmployment.be  This test is not yet approved or cleared by the [place] FDA and has been  authorized for detection and/or diagnosis of SARS-CoV-2 by FDA under an Emergency Use Authorization (EUA). This EUA will remain in effect (meaning this test can be used) for the duration of the COVID-19 declaration under Section 564(b)(1) of the Act, 21 U.S.C. section 360bbb-3(b)(1), unless the authorization is terminated or revoked.  Performed at [facility], [address], [place], [place] [address]     Labs: CBC: Recent Labs  Lab 01/30/23 0748 01/31/23 0507  WBC 7.5 7.8  NEUTROABS 5.2  --   HGB 14.6 14.3  HCT 41.6 42.8  MCV 92.7 96.6  PLT 200 [id number]   Basic Metabolic Panel: Recent Labs  Lab 01/30/23 0748 01/30/23 0952 01/31/23 0507 02/01/23 0519 02/02/23 0418  NA 137  --  139 138 135  K 2.8*  --  3.5 3.3* 2.7*  CL 93*  --  100 98 91*  CO2 32  --  30 29 30   GLUCOSE 186*  --  173* 157* 167*  BUN 22  --  21 21 26*  CREATININE 1.35*  --  1.33* 1.32* 1.32*  CALCIUM 8.5*  --  8.4* 8.1* 9.1  MG  --  2.0  --   --   --   PHOS  --  3.1  --   --   --    Liver Function Tests: Recent Labs  Lab 01/30/23 0748  AST 22  ALT 18  ALKPHOS 50  BILITOT 1.4*  PROT 6.7  ALBUMIN 3.3*    Discharge time spent: greater than 30 minutes.  Signed: Barton Dubois, MD [facility] 02/02/2023

## 2023-02-04 DIAGNOSIS — Z7901 Long term (current) use of anticoagulants: Secondary | ICD-10-CM | POA: Diagnosis not present

## 2023-02-04 DIAGNOSIS — E876 Hypokalemia: Secondary | ICD-10-CM | POA: Insufficient documentation

## 2023-02-04 DIAGNOSIS — I509 Heart failure, unspecified: Secondary | ICD-10-CM | POA: Diagnosis not present

## 2023-02-04 DIAGNOSIS — E1165 Type 2 diabetes mellitus with hyperglycemia: Secondary | ICD-10-CM | POA: Diagnosis not present

## 2023-02-05 ENCOUNTER — Emergency Department (HOSPITAL_COMMUNITY)
Admission: EM | Admit: 2023-02-05 | Discharge: 2023-02-05 | Disposition: A | Discharge status: Home / Self Care | Payer: Medicare HMO | Attending: Emergency Medicine | Admitting: Emergency Medicine

## 2023-02-05 ENCOUNTER — Other Ambulatory Visit: Payer: Self-pay

## 2023-02-05 DIAGNOSIS — E876 Hypokalemia: Secondary | ICD-10-CM

## 2023-02-05 DIAGNOSIS — R739 Hyperglycemia, unspecified: Secondary | ICD-10-CM

## 2023-02-05 DIAGNOSIS — E1165 Type 2 diabetes mellitus with hyperglycemia: Secondary | ICD-10-CM | POA: Diagnosis not present

## 2023-02-05 LAB — URINALYSIS, ROUTINE W REFLEX MICROSCOPIC
Bilirubin Urine: NEGATIVE
Glucose, UA: NEGATIVE mg/dL
Ketones, ur: NEGATIVE mg/dL
Leukocytes,Ua: NEGATIVE
Nitrite: NEGATIVE
Protein, ur: 30 mg/dL — AB
Specific Gravity, Urine: 1.02 (ref 1.005–1.030)
pH: 6 (ref 5.0–8.0)

## 2023-02-05 LAB — CBC
HCT: 44.5 % (ref 39.0–52.0)
Hemoglobin: 16 g/dL (ref 13.0–17.0)
MCH: 31.8 pg (ref 26.0–34.0)
MCHC: 36 g/dL (ref 30.0–36.0)
MCV: 88.5 fL (ref 80.0–100.0)
Platelets: 243 10*3/uL (ref 150–400)
RBC: 5.03 MIL/uL (ref 4.22–5.81)
RDW: 14.6 % (ref 11.5–15.5)
WBC: 10.4 10*3/uL (ref 4.0–10.5)
nRBC: 0 % (ref 0.0–0.2)

## 2023-02-05 LAB — URINALYSIS, MICROSCOPIC (REFLEX)

## 2023-02-05 LAB — CBG MONITORING, ED
Glucose-Capillary: 202 mg/dL — ABNORMAL HIGH (ref 70–99)
Glucose-Capillary: 298 mg/dL — ABNORMAL HIGH (ref 70–99)
Glucose-Capillary: 309 mg/dL — ABNORMAL HIGH (ref 70–99)

## 2023-02-05 LAB — BASIC METABOLIC PANEL
Anion gap: 17 — ABNORMAL HIGH (ref 5–15)
BUN: 37 mg/dL — ABNORMAL HIGH (ref 8–23)
CO2: 30 mmol/L (ref 22–32)
Calcium: 8.7 mg/dL — ABNORMAL LOW (ref 8.9–10.3)
Chloride: 80 mmol/L — ABNORMAL LOW (ref 98–111)
Creatinine, Ser: 1.89 mg/dL — ABNORMAL HIGH (ref 0.61–1.24)
GFR, Estimated: 35 mL/min — ABNORMAL LOW (ref >60)
Glucose, Bld: 288 mg/dL — ABNORMAL HIGH (ref 70–99)
Potassium: 2.4 mmol/L — CL (ref 3.5–5.1)
Sodium: 127 mmol/L — ABNORMAL LOW (ref 135–145)

## 2023-02-05 MED ORDER — POTASSIUM CHLORIDE 10 MEQ/100ML IV SOLN
10.0000 meq | Freq: Once | INTRAVENOUS | Status: AC
  Administered 2023-02-05: 10 meq via INTRAVENOUS
  Filled 2023-02-05: qty 100

## 2023-02-05 MED ORDER — POTASSIUM CHLORIDE CRYS ER 20 MEQ PO TBCR
40.0000 meq | EXTENDED_RELEASE_TABLET | Freq: Once | ORAL | Status: AC
  Administered 2023-02-05: 40 meq via ORAL
  Filled 2023-02-05: qty 2

## 2023-02-05 MED ORDER — INSULIN ASPART 100 UNIT/ML IV SOLN
5.0000 [IU] | Freq: Once | INTRAVENOUS | Status: AC
  Administered 2023-02-05: 5 [IU] via INTRAVENOUS

## 2023-02-05 NOTE — ED Triage Notes (Signed)
Pt c/o hyperglycemia, states his CBG read 435 at home. Does not take insulin.

## 2023-02-05 NOTE — ED Provider Notes (Signed)
[facility] Provider Note   CSN: [id number] Arrival date & time: 02/04/23  2350     History  Chief Complaint  Patient presents with   Hyperglycemia    Vincent Cooper is a 82 y.o. male.  Patient is an 82 year old male with extensive past medical history including type 2 diabetes, COPD, prior pulmonary embolism, chronic CHF, hyperlipidemia.  Patient recently hospitalized and discharged 3 days ago after being admitted for a CHF exacerbation.  He presents today with complaints of elevated blood sugar.  His blood sugars have been running high and was started just today on metformin.  This evening he checked his blood sugar and it was over 400 and presents for evaluation of this.  He denies to me that he is having any abdominal pain, fevers, chills, or other complaints.  He denies chest pain or shortness of breath.  The history is provided by the patient.       Home Medications Prior to Admission medications   Medication Sig Start Date End Date Taking? Authorizing Provider  albuterol (VENTOLIN HFA) 108 (90 Base) MCG/ACT inhaler Inhale 1 puff into the lungs every 6 (six) hours as needed for wheezing or shortness of breath.    [provider]  apixaban (ELIQUIS) 2.5 MG TABS tablet Take 1 tablet (2.5 mg total) by mouth 2 (two) times daily. 09/05/22   Virl Cagey, MD  Budeson-Glycopyrrol-Formoterol (BREZTRI AEROSPHERE) 160-9-4.8 MCG/ACT AERO Inhale 2 puffs into the lungs in the morning and at bedtime. 01/13/23   Maryjane Hurter, MD  diltiazem (CARDIZEM CD) 360 MG 24 hr capsule Take 1 capsule (360 mg total) by mouth daily. 02/03/23   Barton Dubois, MD  hydroxypropyl methylcellulose / hypromellose (ISOPTO TEARS / GONIOVISC) 2.5 % ophthalmic solution Place 1 drop into both eyes as needed for dry eyes.    [provider]  Mepolizumab (NUCALA) 100 MG/ML SOAJ Inject 1 mL (100 mg total) into the skin every 28 (twenty-eight) days.  11/06/22   Maryjane Hurter, MD  metoprolol tartrate (LOPRESSOR) 25 MG tablet Take 1 tablet (25 mg total) by mouth 2 (two) times daily. 06/29/20   Roxan Hockey, MD  nitroGLYCERIN (NITROSTAT) 0.4 MG SL tablet Place 0.4 mg under the tongue every 5 (five) minutes as needed for chest pain.    [provider]  NON FORMULARY Pt uses a cpap nightly    [provider]  potassium chloride SA (KLOR-CON M) 20 MEQ tablet Take 20 mEq by mouth daily. 01/21/23   [provider]  rosuvastatin (CRESTOR) 40 MG tablet TAKE 1 TABLET BY MOUTH ONCE DAILY. 09/13/22   Leonie Man, MD  Spacer/Aero-Holding Chambers (AEROCHAMBER MV) inhaler Use as instructed 04/12/22   Maryjane Hurter, MD  spironolactone (ALDACTONE) 25 MG tablet Take 0.5 tablets (12.5 mg total) by mouth daily. 02/02/23 02/02/24  Barton Dubois, MD  tamsulosin (FLOMAX) 0.4 MG CAPS capsule Take 1 capsule (0.4 mg total) by mouth daily. Patient taking differently: Take 0.4 mg by mouth at bedtime. 06/30/20   Roxan Hockey, MD  torsemide (DEMADEX) 20 MG tablet Take 2 tablets (40 mg total) by mouth 2 (two) times daily. 02/02/23   Barton Dubois, MD      Allergies    Ancef [cefazolin] and Other    Review of Systems   Review of Systems  All other systems reviewed and are negative.   Physical Exam Updated Vital Signs BP 134/74   Pulse 87   Temp Marland Kitchen)  97.5 F (36.4 C) (Oral)   Resp (!) 22   Ht 5\' 9"  (1.753 m)   Wt 124 kg   SpO2 94%   BMI 40.37 kg/m  Physical Exam Vitals and nursing note reviewed.  Constitutional:      General: He is not in acute distress.    Appearance: He is well-developed. He is not diaphoretic.  HENT:     Head: Normocephalic and atraumatic.  Cardiovascular:     Rate and Rhythm: Normal rate and regular rhythm.     Heart sounds: No murmur heard.    No friction rub.  Pulmonary:     Effort: Pulmonary effort is normal. No respiratory distress.     Breath sounds: Normal breath sounds. No  wheezing or rales.  Abdominal:     General: Bowel sounds are normal. There is no distension.     Palpations: Abdomen is soft.     Tenderness: There is no abdominal tenderness.  Musculoskeletal:        General: Normal range of motion.     Cervical back: Normal range of motion and neck supple.  Skin:    General: Skin is warm and dry.  Neurological:     Mental Status: He is alert and oriented to person, place, and time.     Coordination: Coordination normal.     ED Results / Procedures / Treatments   Labs (all labs ordered are listed, but only abnormal results are displayed) Labs Reviewed  BASIC METABOLIC PANEL - Abnormal; Notable for the following components:      Result Value   Sodium 127 (*)    Potassium 2.4 (*)    Chloride 80 (*)    Glucose, Bld 288 (*)    BUN 37 (*)    Creatinine, Ser 1.89 (*)    Calcium 8.7 (*)    GFR, Estimated 35 (*)    Anion gap 17 (*)    All other components within normal limits  URINALYSIS, ROUTINE W REFLEX MICROSCOPIC - Abnormal; Notable for the following components:   Hgb urine dipstick MODERATE (*)    Protein, ur 30 (*)    All other components within normal limits  URINALYSIS, MICROSCOPIC (REFLEX) - Abnormal; Notable for the following components:   Bacteria, UA RARE (*)    All other components within normal limits  CBG MONITORING, ED - Abnormal; Notable for the following components:   Glucose-Capillary 309 (*)    All other components within normal limits  CBC  CBG MONITORING, ED  CBG MONITORING, ED    EKG None  Radiology No results found.  Procedures Procedures    Medications Ordered in ED Medications  potassium chloride 10 mEq in 100 mL IVPB (has no administration in time range)  potassium chloride SA (KLOR-CON M) CR tablet 40 mEq (has no administration in time range)    ED Course/ Medical Decision Making/ A&P  Patient is an 82 year old male with history of diabetes presenting with elevated blood sugar.  He otherwise feels  well, but sugars have been running over 400 at home.  He was just started today on metformin.  Patient arrives here with stable vital signs and physical examination is basically unremarkable.  Workup initiated including CBC and metabolic panel.  These are remarkable for a potassium of 2.4, glucose of 288, and creatinine of 1.9.  Urinalysis basically unremarkable.  Patient given NovoLog and blood sugar is now 202.  He was also given both IV and oral potassium.  Patient otherwise appears clinically  well and I feel can safely be discharged.  I will have him keep a record of his blood sugars, continue his metformin, and also prescribe additional potassium.  Final Clinical Impression(s) / ED Diagnoses Final diagnoses:  None    Rx / DC Orders ED Discharge Orders     None         Veryl Speak, MD 02/05/23 [phone]

## 2023-02-05 NOTE — Discharge Instructions (Signed)
Increase your potassium to 20 mg twice daily for the next 5 days.  Follow-up with your primary doctor for a recheck of your electrolyte panel in the next 3 to 4 days.  Keep a record of your blood sugars as well and take this with you to your appointment.  Return to the ER if symptoms significantly worsen or change.

## 2023-02-06 ENCOUNTER — Telehealth: Payer: Self-pay | Admitting: Cardiology

## 2023-02-06 NOTE — Telephone Encounter (Signed)
Daughter did not answer phone when called.  LVM would get information from provider and call her back

## 2023-02-06 NOTE — Telephone Encounter (Signed)
  Pt c/o medication issue:  1. Name of Medication: metformin 500 mg   2. How are you currently taking this medication (dosage and times per day)?   3. Are you having a reaction (difficulty breathing--STAT)?   4. What is your medication issue? Pt's daughter calling, she said, pt was in the hospital recently and pt was prescribed metformin 500 mg. They wanted to make sure if that ok to take with his heart meds

## 2023-02-06 NOTE — Telephone Encounter (Signed)
Metformin is not on medicine.

## 2023-02-07 NOTE — Telephone Encounter (Signed)
Metformin is listed in   [facility] discharge note. Daughter states  Vincent Cooper primary wanted her to notify Dr [name].    RN reviewed with Dr [name] - it is okay to use Metformin 500 mg with  cardiac medications.    RN informed [name]  to have patient to keep appointment 02/20/23 with D. Wittenborn. NP

## 2023-02-10 DIAGNOSIS — N1831 Chronic kidney disease, stage 3a: Secondary | ICD-10-CM | POA: Diagnosis not present

## 2023-02-10 DIAGNOSIS — J449 Chronic obstructive pulmonary disease, unspecified: Secondary | ICD-10-CM | POA: Diagnosis not present

## 2023-02-10 DIAGNOSIS — I10 Essential (primary) hypertension: Secondary | ICD-10-CM | POA: Diagnosis not present

## 2023-02-10 DIAGNOSIS — I5033 Acute on chronic diastolic (congestive) heart failure: Secondary | ICD-10-CM | POA: Diagnosis not present

## 2023-02-10 DIAGNOSIS — I4891 Unspecified atrial fibrillation: Secondary | ICD-10-CM | POA: Diagnosis not present

## 2023-02-10 DIAGNOSIS — E1122 Type 2 diabetes mellitus with diabetic chronic kidney disease: Secondary | ICD-10-CM | POA: Diagnosis not present

## 2023-02-10 NOTE — Telephone Encounter (Signed)
Verified patient received information regarding Metformin.  She has received information and verified upcoming appt. While on the phone.

## 2023-02-17 NOTE — Progress Notes (Unsigned)
Cardiology Clinic Note   Date: 02/20/2023 ID: Vincent Cooper, DOB November 13, 1941, MRN [id number]  Primary Cardiologist:  Glenetta Hew, MD  Patient Profile    Vincent Cooper is a 82 y.o. male who presents to the clinic today for hospital follow-up.  Past medical history significant for: Nonobstructive CAD. Coronary CTA with FFR 03/07/2020: Calcium score 1651 (83rd percentile).  LM moderate mixed calcified plaque?  50 to 69% mild to moderate 25 to 49% distal LAD moderate to severe plaque 50 to 69% (possible focal stenosis >70%.  LCx with OM1 moderate mixed plaque in proximal LCx and OM roughly 50 to 69%.  FFR showed no flow-limiting stenosis.  Recommend aggressive risk factor modification. Permanent afib. Chronic diastolic heart failure. Echo 01/31/2023: EF 65 to 70%.  Mild LAE.  Moderate RAE.  Trivial MR.  Moderate calcification of the aortic valve.  Mild AS, peak gradient 13 mmHg.  Dilated IVC, RA pressure 8 mmHg. PE. CTA chest (PE protocol) 11/06/2019: Partially occlusive thrombus in the posterior right semental and subsegmental pulmonary artery branch and right middle lobe subsegmental branches.  No evidence of right ventricular heart strain. Echo 11/07/2019: EF 60 to 65%.  Mild LAE.  Small pericardial effusion.  Mild aortic valve sclerosis without stenosis.  Moderately elevated pulmonary artery systolic pressure.  Moderately elevated RVSP, RA pressure 8 mmHg. Hypertension. Hyperlipidemia.  Lipid panel 05/13/2022: LDL 54, HDL 87, TG 84, total 179. T2DM. COPD. OSA.   History of Present Illness    Vincent Cooper was first evaluated by Dr. [name] on 01/19/2020 for shortness of breath and chest pain at the request of Dr. Marveen Reeks.  Patient had several ER visits in December 2020 and February 2021 related to PE and COPD exacerbation. Coronary CTA showed moderate CAD that was not flow limiting per FFR and aggressive risk factor modification.  Patient was last seen in the office by Dr. [name] on 05/27/2022.   Patient was recovering from recent COPD exacerbation without heart failure.  He was stable from a cardiac standpoint no medication changes were made.  Most recently, patient presented to the ER on 01/30/2023 with shortness of breath for several days for which his PCP increased diuretic. Patient complained of worsening symptoms with orthopnea and PND. Troponin negative x 2. BNP 108. Potassium 2.8. Cardiology was consulted and patient was admitted for IV diuresis. Potassium was repleted.  Patient was discharged on 02/02/2023 to follow-up as an outpatient. Patient returned to the ER on 02/05/2023 for hyperglycemia with home reading 435. Patient had recently been started on metformin. He was treated with IV insulin and discharged. He was also noted to be hypokalemic and was given additional potassium.   Today, patient reports he is doing very well.  His edema and breathing have improved greatly since hospital discharge. Home weight has been stable.  He reports brisk diuresis on torsemide. Patient feels his fluid overload was related to dietary indiscretion. He reports he was eating bologna and other processed meats as well as canned foods. Since discharge from the hospital he has stopped eating processed meats and canned foods.  He has been buying rotisserie chicken and fresh vegetables. He bought a "no salt" product to replace table salt. We discussed the need to be cautious about those products secondary to them containing potassium.   He has also been watching his carb/sugar intake and his morning sugars have been running in the 140-150s. He is doing his normal household activities of housework including washing dishes, sweeping and other light to moderate  housework. His two sisters come once a week to help him with heavier housework. He denies chest pain, shortness of breath with household activities, or PND. He denies blood in stool or urine or other bleeding concerns.      ROS: All other systems reviewed and  are otherwise negative except as noted in History of Present Illness.  Studies Reviewed    ECG is not ordered today.  Risk Assessment/Calculations     CHA2DS2-VASc Score = 6   This indicates a 9.7% annual risk of stroke. The patient's score is based upon: CHF History: 1 HTN History: 1 Diabetes History: 1 Stroke History: 0 Vascular Disease History: 1 Age Score: 2 Gender Score: 0             Physical Exam    VS:  BP (!) 108/52 (BP Location: Left Arm, Patient Position: Sitting, Cuff Size: Normal)   Pulse 88   Ht 5\' 9"  (1.753 m)   Wt 271 lb (122.9 kg)   BMI 40.02 kg/m  , BMI Body mass index is 40.02 kg/m.  GEN: Well nourished, well developed, in no acute distress. Neck: No JVD or carotid bruits. Cardiac: Irregular rhythm, regular rate. No murmurs. No rubs or gallops.   Respiratory:  Respirations regular and unlabored. Clear to auscultation without rales, wheezing or rhonchi. GI: Soft, nontender, nondistended. Extremities: Radials/DP/PT 2+ and equal bilaterally. No clubbing or cyanosis.  Mild R>L ankle edema. Skin: Warm and dry, no rash. Neuro: Strength intact.  Assessment & Plan   Chronic dyastolic heart failure.  Echo March 2024 showed EF 65 to 70%, mild AS.  Patient reports marked improvement in lower extremity edema and shortness of breath. He reports brisk diuresis on Torsemide. Patient feels he had an exacerbation of heart failure secondary to dietary indiscretion prior to hospitalization. Diet consisted mainly of bologna and other processed meats and canned foods. He has since changed his diet to include non-processed meats and fresh vegetables. He purchased a "no salt product" and was instructed to check the ingredients for potassium if that is what it contains he is to stop using it. He verbalized understanding. He is performing household activities and ADLs without shortness of breath. He has mild R>L ankle edema but otherwise euvolemic and well compensated on exam.   Continue metoprolol, torsemide, spironolactone.  Will get BMP today. Nonobstructive CAD.  Coronary CTA April 2021 showed calcium score 1651, moderate nonobstructive CAD.  Patient denies chest pain, tightness of pressure. Continue Eliquis, metoprolol, rosuvastatin, as needed SL NTG. Permanent A-fib.  Rate controlled.  Patient has no cardiac awareness of afib. Heart rate 88 bpm today.  Continue metoprolol, diltiazem, and Eliquis.  Hypertension.  BP today 108/52.  Patient denies headaches or dizziness.  Continue diltiazem, metoprolol, spironolactone. Hyperlipidemia.  LDL June 2023 54, at goal.  Continue rosuvastatin.  He is fasting today so we will get a repeat lipid panel. Hypokalemia.  Patient hypokalemic during hospital admission 01/30/2023 (2.8).  Potassium was repleted.  Patient's potassium was found to be low again during ER visit for hyperglycemia (2.4).  Potassium was again repleted. Will get a BMP today.  Disposition: BMP and lipid panel today.  Provided with salty 6 information sheet.  Patient is referred to diabetes management in [place].  He is instructed to check ingredients of "no salt" product and stop using if it contains potassium.  Return in 3 months or sooner as needed.        Signed, Justice Britain. Lovel Suazo, DNP, NP-C

## 2023-02-20 ENCOUNTER — Encounter: Payer: Self-pay | Admitting: Student

## 2023-02-20 ENCOUNTER — Ambulatory Visit: Payer: Medicare HMO | Attending: Student | Admitting: Student

## 2023-02-20 ENCOUNTER — Other Ambulatory Visit (HOSPITAL_COMMUNITY): Payer: Self-pay

## 2023-02-20 VITALS — BP 108/52 | HR 88 | Ht 69.0 in | Wt 271.0 lb

## 2023-02-20 DIAGNOSIS — E785 Hyperlipidemia, unspecified: Secondary | ICD-10-CM | POA: Diagnosis not present

## 2023-02-20 DIAGNOSIS — I10 Essential (primary) hypertension: Secondary | ICD-10-CM | POA: Diagnosis not present

## 2023-02-20 DIAGNOSIS — E119 Type 2 diabetes mellitus without complications: Secondary | ICD-10-CM

## 2023-02-20 DIAGNOSIS — I5032 Chronic diastolic (congestive) heart failure: Secondary | ICD-10-CM

## 2023-02-20 DIAGNOSIS — I2510 Atherosclerotic heart disease of native coronary artery without angina pectoris: Secondary | ICD-10-CM

## 2023-02-20 DIAGNOSIS — E1169 Type 2 diabetes mellitus with other specified complication: Secondary | ICD-10-CM

## 2023-02-20 DIAGNOSIS — E876 Hypokalemia: Secondary | ICD-10-CM

## 2023-02-20 NOTE — Patient Instructions (Signed)
Medication Instructions:  Your physician recommends that you continue on your current medications as directed. Please refer to the Current Medication list given to you today.  *If you need a refill on your cardiac medications before your next appointment, please call your pharmacy*  Check the ingredients on no salt products if it says "Potassium" do not use.    Lab Work: Your physician recommends that you have the following labs drawn today: BMET and Lipid panel  If you have labs (blood work) drawn today and your tests are completely normal, you will receive your results only by: MyChart Message (if you have MyChart) OR A paper copy in the mail If you have any lab test that is abnormal or we need to change your treatment, we will call you to review the results.   Testing/Procedures: NONE   Follow-Up: At [facility], you and your health needs are our priority.  As part of our continuing mission to provide you with exceptional heart care, we have created designated Provider Care Teams.  These Care Teams include your primary Cardiologist (physician) and Advanced Practice Providers (APPs -  Physician Assistants and Nurse Practitioners) who all work together to provide you with the care you need, when you need it.  We recommend signing up for the patient portal called "MyChart".  Sign up information is provided on this After Visit Summary.  MyChart is used to connect with patients for Virtual Visits (Telemedicine).  Patients are able to view lab/test results, encounter notes, upcoming appointments, etc.  Non-urgent messages can be sent to your provider as well.   To learn more about what you can do with MyChart, go to [email or web address].    Your next appointment:   3 month(s)  Provider:   Glenetta Hew, MD

## 2023-02-21 LAB — BASIC METABOLIC PANEL
BUN/Creatinine Ratio: 19 (ref 10–24)
BUN: 28 mg/dL — ABNORMAL HIGH (ref 8–27)
CO2: 21 mmol/L (ref 20–29)
Calcium: 9.5 mg/dL (ref 8.6–10.2)
Chloride: 97 mmol/L (ref 96–106)
Creatinine, Ser: 1.5 mg/dL — ABNORMAL HIGH (ref 0.76–1.27)
Glucose: 144 mg/dL — ABNORMAL HIGH (ref 70–99)
Potassium: 4.3 mmol/L (ref 3.5–5.2)
Sodium: 134 mmol/L (ref 134–144)
eGFR: 47 mL/min/{1.73_m2} — ABNORMAL LOW (ref >59)

## 2023-02-21 LAB — LIPID PANEL
Chol/HDL Ratio: 2 ratio (ref 0.0–5.0)
Cholesterol, Total: 161 mg/dL (ref 100–199)
HDL: 82 mg/dL (ref >39)
LDL Chol Calc (NIH): 60 mg/dL (ref 0–99)
Triglycerides: 109 mg/dL (ref 0–149)
VLDL Cholesterol Cal: 19 mg/dL (ref 5–40)

## 2023-02-24 ENCOUNTER — Encounter: Payer: Self-pay | Admitting: Nurse Practitioner

## 2023-02-24 ENCOUNTER — Other Ambulatory Visit (HOSPITAL_COMMUNITY): Payer: Self-pay

## 2023-03-04 DIAGNOSIS — I10 Essential (primary) hypertension: Secondary | ICD-10-CM | POA: Diagnosis not present

## 2023-03-04 DIAGNOSIS — J449 Chronic obstructive pulmonary disease, unspecified: Secondary | ICD-10-CM | POA: Diagnosis not present

## 2023-03-04 DIAGNOSIS — E1122 Type 2 diabetes mellitus with diabetic chronic kidney disease: Secondary | ICD-10-CM | POA: Diagnosis not present

## 2023-03-04 DIAGNOSIS — I4891 Unspecified atrial fibrillation: Secondary | ICD-10-CM | POA: Diagnosis not present

## 2023-03-04 DIAGNOSIS — N1831 Chronic kidney disease, stage 3a: Secondary | ICD-10-CM | POA: Diagnosis not present

## 2023-03-04 DIAGNOSIS — I5033 Acute on chronic diastolic (congestive) heart failure: Secondary | ICD-10-CM | POA: Diagnosis not present

## 2023-03-20 ENCOUNTER — Other Ambulatory Visit: Payer: Self-pay

## 2023-03-24 ENCOUNTER — Other Ambulatory Visit: Payer: Self-pay | Admitting: Student

## 2023-03-25 ENCOUNTER — Other Ambulatory Visit (HOSPITAL_COMMUNITY): Payer: Self-pay

## 2023-03-25 ENCOUNTER — Other Ambulatory Visit: Payer: Self-pay

## 2023-04-01 ENCOUNTER — Other Ambulatory Visit: Payer: Self-pay

## 2023-04-01 ENCOUNTER — Other Ambulatory Visit (HOSPITAL_COMMUNITY): Payer: Self-pay

## 2023-04-02 NOTE — Patient Instructions (Signed)

## 2023-04-04 ENCOUNTER — Ambulatory Visit (INDEPENDENT_AMBULATORY_CARE_PROVIDER_SITE_OTHER): Payer: Medicare HMO | Admitting: Nurse Practitioner

## 2023-04-04 ENCOUNTER — Encounter: Payer: Self-pay | Admitting: Nurse Practitioner

## 2023-04-04 VITALS — BP 108/80 | HR 109 | Ht 69.0 in | Wt 272.4 lb

## 2023-04-04 DIAGNOSIS — E1122 Type 2 diabetes mellitus with diabetic chronic kidney disease: Secondary | ICD-10-CM | POA: Diagnosis not present

## 2023-04-04 DIAGNOSIS — Z7984 Long term (current) use of oral hypoglycemic drugs: Secondary | ICD-10-CM

## 2023-04-04 DIAGNOSIS — E782 Mixed hyperlipidemia: Secondary | ICD-10-CM | POA: Diagnosis not present

## 2023-04-04 DIAGNOSIS — I10 Essential (primary) hypertension: Secondary | ICD-10-CM | POA: Diagnosis not present

## 2023-04-04 DIAGNOSIS — N1831 Chronic kidney disease, stage 3a: Secondary | ICD-10-CM | POA: Diagnosis not present

## 2023-04-04 NOTE — Progress Notes (Signed)
Endocrinology Consult Note       04/04/2023, 10:09 AM   Subjective:    Patient ID: Vincent Cooper, male    DOB: Mar 10, 1941.  Vincent Cooper is being seen in consultation for management of currently uncontrolled symptomatic diabetes requested by  Estanislado Pandy, MD.   Past Medical History:  Diagnosis Date   Chronic gout    COPD (chronic obstructive pulmonary disease) (HCC)    Oxygen dependent   Coronary artery disease due to calcified coronary lesion 03/08/2020   CORONARY CTA: Cor Ca++ Score 1651 !!: ~ 50% LM (CTFFR 1 - NS). Large Dom RCA-<PDA-PAV/PL - diffuse mild-mod plaque: prox (25-49%), mid (50-69%) CTFFR (p 0.99, m 0.85, d 0.81 - NS).  Med-Size LAD - prox-mid long diffuse Mod-Severe Ca++ plaque (~50-69%, ?>70%): CTFFR p 0.95, m 0.88, d 0.86 -NS.  CTFFR LCx 0.94, OM1 0.90 - NS. (NS=Not Significant).   Diabetes mellitus type II, non insulin dependent (HCC)    Hypertension    Morbid obesity (HCC)    BMI of 38.5 with multiple risk factors.   OSA on CPAP    Pulmonary emboli (HCC) 10/2019   Chest CTA-4 Phadke opacification of main PA but there is partially occlusive main posterior RLL and segmental/segmental branches.  Small thrombus noted in the anterior right middle lobe.  No RV strain.  Scattered aortic atherosclerosis involving great vessels.  Coronary calcification noted.   RLS (restless legs syndrome)     Past Surgical History:  Procedure Laterality Date   BOWEL RESECTION N/A 10/18/2020   Procedure: SMALL BOWEL RESECTION;  Surgeon: Lucretia Roers, MD;  Location: [facility];  Service: General;  Laterality: N/A;   Cataract surgery Right    COLONOSCOPY WITH PROPOFOL N/A 09/27/2020   Procedure: COLONOSCOPY WITH PROPOFOL;  Surgeon: Malissa Hippo, MD;  Location: [facility];  Service: Endoscopy;  Laterality: N/A;  1:15   COLOSTOMY REVERSAL N/A 10/18/2020   Procedure: COLOSTOMY REVERSAL;  Surgeon: Lucretia Roers, MD;  Location: [facility];  Service: General;  Laterality: N/A;   LAPAROTOMY N/A 06/21/2020   Procedure: EXPLORATORY LAPAROTOMY,  bowel resection, creation ostomy;  Surgeon: Lucretia Roers, MD;  Location: [facility];  Service: General;  Laterality: N/A;   POLYPECTOMY  09/27/2020   Procedure: POLYPECTOMY;  Surgeon: Malissa Hippo, MD;  Location: [facility];  Service: Endoscopy;;   TRANSTHORACIC ECHOCARDIOGRAM  10/20/2020   EF 60 to 65%.  Moderate LVH at least GR 1 DD.  Mild to moderate LA dilation.  Moderate aortic valve calcification-sclerosis with no stenosis.  Normal RAP.   VENTRAL HERNIA REPAIR N/A 09/02/2022   Procedure: HERNIA REPAIR VENTRAL ADULT WITH  MESH;  Surgeon: Lucretia Roers, MD;  Location: [facility];  Service: General;  Laterality: N/A;    Social History   Socioeconomic History   Marital status: Legally Separated    Spouse name: Not on file   Number of children: 3   Years of education: Not on file   Highest education level: Not on file  Occupational History   Occupation: retired  Tobacco Use   Smoking status: Former    Packs/day: 2.00    Years: 55.00  Additional pack years: 0.00    Total pack years: 110.00    Types: Cigarettes    Quit date: 11/18/2001    Years since quitting: 21.3   Smokeless tobacco: Former  Building services engineer Use: Never used  Substance and Sexual Activity   Alcohol use: Yes    Comment: sometimes   Drug use: Never   Sexual activity: Not Currently  Other Topics Concern   Not on file  Social History Narrative   Not on file   Social Determinants of Health   Financial Resource Strain: Not on file  Food Insecurity: No Food Insecurity (02/01/2023)   Hunger Vital Sign    Worried About Running Out of Food in the Last Year: Never true    Ran Out of Food in the Last Year: Never true  Transportation Needs: No Transportation Needs (02/01/2023)   PRAPARE - Administrator, Civil Service (Medical): No    Lack of Transportation  (Non-Medical): No  Physical Activity: Not on file  Stress: Not on file  Social Connections: Not on file    Family History  Problem Relation Age of Onset   Colon cancer Mother    Emphysema Father        smoked   Diverticulitis Sister    Diabetes Brother    Diabetes Brother    Heart Problems Maternal Grandmother    Breast cancer Daughter    Lupus Son    CAD Neg Hx     Outpatient Encounter Medications as of 04/04/2023  Medication Sig   albuterol (VENTOLIN HFA) 108 (90 Base) MCG/ACT inhaler Inhale 1 puff into the lungs every 6 (six) hours as needed for wheezing or shortness of breath.   apixaban (ELIQUIS) 2.5 MG TABS tablet Take 1 tablet (2.5 mg total) by mouth 2 (two) times daily.   BREZTRI AEROSPHERE 160-9-4.8 MCG/ACT AERO INHALE 2 PUFFS INTO THE LUNG IN THE MORNING AND AT BEDTIME.   diltiazem (CARDIZEM CD) 360 MG 24 hr capsule Take 1 capsule (360 mg total) by mouth daily.   hydroxypropyl methylcellulose / hypromellose (ISOPTO TEARS / GONIOVISC) 2.5 % ophthalmic solution Place 1 drop into both eyes as needed for dry eyes.   Mepolizumab (NUCALA) 100 MG/ML SOAJ Inject 1 mL (100 mg total) into the skin every 28 (twenty-eight) days.   metFORMIN (GLUCOPHAGE-XR) 500 MG 24 hr tablet Take 500 mg by mouth every morning.   metoprolol tartrate (LOPRESSOR) 25 MG tablet Take 1 tablet (25 mg total) by mouth 2 (two) times daily.   nitroGLYCERIN (NITROSTAT) 0.4 MG SL tablet Place 0.4 mg under the tongue every 5 (five) minutes as needed for chest pain.   NON FORMULARY Pt uses a cpap nightly   potassium chloride SA (KLOR-CON M) 20 MEQ tablet Take 20 mEq by mouth daily.   rosuvastatin (CRESTOR) 40 MG tablet TAKE 1 TABLET BY MOUTH ONCE DAILY.   Spacer/Aero-Holding Chambers (AEROCHAMBER MV) inhaler Use as instructed   spironolactone (ALDACTONE) 25 MG tablet Take 0.5 tablets (12.5 mg total) by mouth daily.   tamsulosin (FLOMAX) 0.4 MG CAPS capsule Take 1 capsule (0.4 mg total) by mouth daily. (Patient  taking differently: Take 0.4 mg by mouth at bedtime.)   torsemide (DEMADEX) 20 MG tablet Take 2 tablets (40 mg total) by mouth 2 (two) times daily.   No facility-administered encounter medications on file as of 04/04/2023.    ALLERGIES: Allergies  Allergen Reactions   Ancef [Cefazolin] Itching and Rash   Other  Patient reports he was allergic to something in an IV he was given but does not know what the substance was. As of 01/19/2020     VACCINATION STATUS: Immunization History  Administered Date(s) Administered   Influenza Inj Mdck Quad With Preservative 08/28/2018   Influenza,inj,quad, With Preservative 08/29/2015    Diabetes He presents for his initial diabetic visit. He has type 2 diabetes mellitus. Onset time: diagnosed at approx age of 70. His disease course has been fluctuating. There are no hypoglycemic associated symptoms. Associated symptoms include fatigue and foot paresthesias. There are no hypoglycemic complications. Diabetic complications include heart disease (CHF, CAD with Afib), nephropathy and peripheral neuropathy. Risk factors for coronary artery disease include diabetes mellitus, dyslipidemia, family history, obesity, male sex, hypertension and sedentary lifestyle. Current diabetic treatment includes diet and oral agent (monotherapy). He is compliant with treatment most of the time. His weight is fluctuating minimally. He is following a generally healthy diet. When asked about meal planning, he reported none. He has not had a previous visit with a dietitian. He rarely participates in exercise. (He presents today for his consultation, accompanied by family, with no meter or logs to review.  His most recent A1c on 01/30/23 during recent hospitalization for Afib was 6.8%.  When he was first diagnosed years ago, he was started on Metformin but was later able to come off due to improvement and manage with lifestyle alone.  He was recently restarted on Metformin.  He monitors  glucose as needed, checked it this morning and it was 148.  He drinks mostly water and black coffee, eats 2 meals per day (skipping lunch) and does snack before bed as well.  He does not engage in routine physical activity as his mobility is limited (walks with cane).  He is due for eye exam, has never seen podiatry in the past.) An ACE inhibitor/angiotensin II receptor blocker is not being taken. He does not see a podiatrist.Eye exam is not current.     Review of systems  Constitutional: + Minimally fluctuating body weight, current Body mass index is 40.23 kg/m., no fatigue, no subjective hyperthermia, no subjective hypothermia Eyes: no blurry vision, no xerophthalmia ENT: no sore throat, no nodules palpated in throat, no dysphagia/odynophagia, no hoarseness Cardiovascular: no chest pain, no shortness of breath, no palpitations, no leg swelling Respiratory: no cough, no shortness of breath Gastrointestinal: no nausea/vomiting/diarrhea Musculoskeletal: no muscle/joint aches Skin: no rashes, no hyperemia Neurological: no tremors, no numbness, no tingling, no dizziness Psychiatric: no depression, no anxiety  Objective:     BP 108/80 (BP Location: Left Arm, Patient Position: Sitting, Cuff Size: Large)   Pulse (!) 109   Ht 5\' 9"  (1.753 m)   Wt 272 lb 6.4 oz (123.6 kg)   BMI 40.23 kg/m   Wt Readings from Last 3 Encounters:  04/04/23 272 lb 6.4 oz (123.6 kg)  02/20/23 271 lb (122.9 kg)  02/05/23 273 lb 5.9 oz (124 kg)     BP Readings from Last 3 Encounters:  04/04/23 108/80  02/20/23 (!) 108/52  02/05/23 120/61     Physical Exam- Limited  Constitutional:  Body mass index is 40.23 kg/m. , not in acute distress, normal state of mind Eyes:  EOMI, no exophthalmos Neck: Supple Cardiovascular: RRR, + murmur, rubs, or gallops, no edema Respiratory: Adequate breathing efforts, no crackles, rales, rhonchi, or wheezing Musculoskeletal: no gross deformities, strength intact in all four  extremities, no gross restriction of joint movements Skin:  no rashes, no hyperemia  Neurological: no tremor with outstretched hands    CMP ( most recent) CMP     Component Value Date/Time   NA 134 02/20/2023 0855   K 4.3 02/20/2023 0855   CL 97 02/20/2023 0855   CO2 21 02/20/2023 0855   GLUCOSE 144 (H) 02/20/2023 0855   GLUCOSE 288 (H) 02/05/2023 0048   BUN 28 (H) 02/20/2023 0855   CREATININE 1.50 (H) 02/20/2023 0855   CREATININE 1.16 04/12/2022 1618   CALCIUM 9.5 02/20/2023 0855   PROT 6.7 01/30/2023 0748   PROT 6.2 03/20/2020 1151   ALBUMIN 3.3 (L) 01/30/2023 0748   ALBUMIN 4.1 03/20/2020 1151   AST 22 01/30/2023 0748   ALT 18 01/30/2023 0748   ALKPHOS 50 01/30/2023 0748   BILITOT 1.4 (H) 01/30/2023 0748   BILITOT 0.4 03/20/2020 1151   GFRNONAA 35 (L) 02/05/2023 0048   GFRAA 39 (L) 06/29/2020 0348     Diabetic Labs (most recent): Lab Results  Component Value Date   HGBA1C 6.8 (H) 01/30/2023   HGBA1C 4.7 (L) 10/16/2020   HGBA1C 5.4 06/17/2020     Lipid Panel ( most recent) Lipid Panel     Component Value Date/Time   CHOL 161 02/20/2023 0855   TRIG 109 02/20/2023 0855   HDL 82 02/20/2023 0855   CHOLHDL 2.0 02/20/2023 0855   LDLCALC 60 02/20/2023 0855   LABVLDL 19 02/20/2023 0855      Lab Results  Component Value Date   TSH 3.583 01/30/2023   TSH 4.978 (H) 10/20/2020   FREET4 1.03 10/21/2020           Assessment & Plan:   1) Type 2 diabetes mellitus with stage 3a chronic kidney disease, without long-term current use of insulin (HCC)  He presents today for his consultation, accompanied by family, with no meter or logs to review.  His most recent A1c on 01/30/23 during recent hospitalization for Afib was 6.8%.  When he was first diagnosed years ago, he was started on Metformin but was later able to come off due to improvement and manage with lifestyle alone.  He was recently restarted on Metformin.  He monitors glucose as needed, checked it this  morning and it was 148.  He drinks mostly water and black coffee, eats 2 meals per day (skipping lunch) and does snack before bed as well.  He does not engage in routine physical activity as his mobility is limited (walks with cane).  He is due for eye exam, has never seen podiatry in the past.  - Jere Piontek has currently uncontrolled symptomatic type 2 DM since 82 years of age, with most recent A1c of 6.8 %.   -Recent labs reviewed.  - I had a long discussion with him about the progressive nature of diabetes and the pathology behind its complications. -his diabetes is complicated by CKD stage 3, neuropathy and he remains at a high risk for more acute and chronic complications which include CAD, CVA, CKD, retinopathy, and neuropathy. These are all discussed in detail with him.  The following Lifestyle Medicine recommendations according to American College of Lifestyle Medicine [facility]) were discussed and offered to patient and he agrees to start the journey:  A. Whole Foods, Plant-based plate comprising of fruits and vegetables, plant-based proteins, whole-grain carbohydrates was discussed in detail with the patient.   A list for source of those nutrients were also provided to the patient.  Patient will use only water or unsweetened tea for hydration. B.  The need  to stay away from risky substances including alcohol, smoking; obtaining 7 to 9 hours of restorative sleep, at least 150 minutes of moderate intensity exercise weekly, the importance of healthy social connections,  and stress reduction techniques were discussed. C.  A full color page of  Calorie density of various food groups per pound showing examples of each food groups was provided to the patient.  - I have counseled him on diet and weight management by adopting a carbohydrate restricted/protein rich diet. Patient is encouraged to switch to unprocessed or minimally processed complex starch and increased protein intake (animal or plant  source), fruits, and vegetables. -  he is advised to stick to a routine mealtimes to eat 3 meals a day and avoid unnecessary snacks (to snack only to correct hypoglycemia).   - he acknowledges that there is a room for improvement in his food and drink choices. - Suggestion is made for him to avoid simple carbohydrates from his diet including Cakes, Sweet Desserts, Ice Cream, Soda (diet and regular), Sweet Tea, Candies, Chips, Cookies, Store Bought Juices, Alcohol in Excess of 1-2 drinks a day, Artificial Sweeteners, Coffee Creamer, and "Sugar-free" Products. This will help patient to have more stable blood glucose profile and potentially avoid unintended weight gain.  - I have approached him with the following individualized plan to manage his diabetes and patient agrees:   -He is advised to continue Metformin 500 mg ER daily after breakfast (to minimize GI upset).  His ultimate goal is to manage with lifestyle modifications and come off medications in the future.  We did discuss adding GLP1 vs SGLT2i which both have cardiovascular benefits as well as glucose management, however we can avoid this given his motivation to be off meds altogether.  -he is encouraged to start monitoring glucose 1 times daily, before breakfast, and to call the clinic if he has readings less than 70 or above 300 for 3 tests in a row.  - he is warned not to take insulin without proper monitoring per orders. - Adjustment parameters are given to him for hypo and hyperglycemia in writing. . - he is not a candidate for full dose Metformin due to concurrent renal insufficiency.  - he will be considered for incretin therapy as appropriate next visit.  - Specific targets for  A1c; LDL, HDL, and Triglycerides were discussed with the patient.  2) Blood Pressure /Hypertension:  his blood pressure is controlled to target.   he is advised to continue his current medications including Lopressor 25 mg po twice daily, Demadex 40 mg  p.o. twice daily.  3) Lipids/Hyperlipidemia:    Review of his recent lipid panel from 02/20/23 showed controlled LDL at 60 .  he is advised to continue Crestor 40 mg daily at bedtime.  Side effects and precautions discussed with him.  4)  Weight/Diet:  his Body mass index is 40.23 kg/m.  -  clearly complicating his diabetes care.   he is a candidate for weight loss. I discussed with him the fact that loss of 5 - 10% of his  current body weight will have the most impact on his diabetes management.  Exercise, and detailed carbohydrates information provided  -  detailed on discharge instructions.  5) Chronic Care/Health Maintenance: -he is not on ACEI/ARB and is on Statin medications and is encouraged to initiate and continue to follow up with Ophthalmology, Dentist, Podiatrist at least yearly or according to recommendations, and advised to stay away from smoking. I have recommended yearly  flu vaccine and pneumonia vaccine at least every 5 years; moderate intensity exercise for up to 150 minutes weekly; and sleep for at least 7 hours a day.  - he is advised to maintain close follow up with Sasser, Clarene Critchley, MD for primary care needs, as well as his other providers for optimal and coordinated care.   - Time spent in this patient care: 60 min, of which > 50% was spent in counseling him about his diabetes and the rest reviewing his blood glucose logs, discussing his hypoglycemia and hyperglycemia episodes, reviewing his current and previous labs/studies (including abstraction from other facilities) and medications doses and developing a long term treatment plan based on the latest standards of care/guidelines; and documenting his care.    Please refer to Patient Instructions for Blood Glucose Monitoring and Insulin/Medications Dosing Guide" in media tab for additional information. Please also refer to "Patient Self Inventory" in the Media tab for reviewed elements of pertinent patient history.  Godric Kole  participated in the discussions, expressed understanding, and voiced agreement with the above plans.  All questions were answered to his satisfaction. he is encouraged to contact clinic should he have any questions or concerns prior to his return visit.     Follow up plan: - Return in about 2 months (around 06/04/2023) for Bring meter and logs, Diabetes F/U, Previsit labs.    Ronny Bacon, [facility] [address] [place], [place] [address] Phone: [phone] Fax: [phone]  04/04/2023, 10:09 AM

## 2023-04-08 ENCOUNTER — Other Ambulatory Visit (HOSPITAL_COMMUNITY): Payer: Self-pay

## 2023-04-16 ENCOUNTER — Other Ambulatory Visit: Payer: Self-pay | Admitting: Student

## 2023-04-16 ENCOUNTER — Other Ambulatory Visit (HOSPITAL_COMMUNITY): Payer: Self-pay

## 2023-04-16 DIAGNOSIS — J4489 Other specified chronic obstructive pulmonary disease: Secondary | ICD-10-CM

## 2023-04-17 ENCOUNTER — Other Ambulatory Visit: Payer: Self-pay

## 2023-04-17 ENCOUNTER — Other Ambulatory Visit (HOSPITAL_COMMUNITY): Payer: Self-pay

## 2023-04-17 MED ORDER — NUCALA 100 MG/ML ~~LOC~~ SOAJ
100.0000 mg | SUBCUTANEOUS | 1 refills | Status: DC
  Filled 2023-04-17: qty 1, 28d supply, fill #0
  Filled 2023-05-16: qty 1, 28d supply, fill #1
  Filled 2023-06-12: qty 1, 28d supply, fill #2
  Filled 2023-07-03: qty 1, 28d supply, fill #3
  Filled 2023-07-29: qty 1, 28d supply, fill #4
  Filled 2023-08-21: qty 1, 28d supply, fill #5

## 2023-04-17 NOTE — Telephone Encounter (Signed)
Refill sent for [facility] to [facility]: [phone]   Dose: 100 mg Sq every 4 weeks  Last OV: 01/13/23 Provider: Dr. Thora Lance  Next OV: 05/02/23  Chesley Mires, PharmD, MPH, BCPS Clinical Pharmacist (Rheumatology and Pulmonology)

## 2023-04-21 ENCOUNTER — Other Ambulatory Visit (HOSPITAL_COMMUNITY): Payer: Self-pay

## 2023-04-22 ENCOUNTER — Other Ambulatory Visit (HOSPITAL_COMMUNITY): Payer: Self-pay

## 2023-04-29 NOTE — Progress Notes (Signed)
onar  Synopsis: Referred for COPD by Estanislado Pandy, MD  Subjective:   PATIENT ID: Vincent Cooper GENDER: male DOB: 12-17-1940, MRN: [id number]  Chief Complaint  Patient presents with   Follow-up    Breathing is about the same. He uses his albuterol inhaler about 3 x per wk on average.    81yM with history of COPD on trelegy 200 and systemic steroids and used to see pulmonologist in [place], CAD, DM2, OSA on CPAP, HTN, PE 10/2019 - he is unsure if he still taking eliquis due to this PE  He has DOE to 100 feet. Worsening gradually overall but can have episodic worsening. He does have a productive cough typically in the morning. No CP. Has been on trelegy for a couple years.   He has been on systemic steroids for 2 months. Down to 10 mg daily.   He has never tried pulmonary rehab before  Not smoking currently, quit 20 ya. 50 py+ smoking.   Uses CPAP every night. But does have trouble breathing if he lies down flat. Probably excessively sleepy during the day.   Never had asthma as a kid, does frequently have sinus congestion but no history of sinus surgeries.   He father had emphysema  He worked in home improvement. He has lived in [place] and [place]. No MJ or vaping.   Interval HPI:  Had admission 3/14-3/17 for CHF - torsemide increased to 40 bid at discharge, aldactone added  No change in DOE, no new bothersome cough. Using albuterol 2-3 times weekly.   Otherwise pertinent review of systems is negative.  Past Medical History:  Diagnosis Date   Chronic gout    COPD (chronic obstructive pulmonary disease) (HCC)    Oxygen dependent   Coronary artery disease due to calcified coronary lesion 03/08/2020   CORONARY CTA: Cor Ca++ Score 1651 !!: ~ 50% LM (CTFFR 1 - NS). Large Dom RCA-<PDA-PAV/PL - diffuse mild-mod plaque: prox (25-49%), mid (50-69%) CTFFR (p 0.99, m 0.85, d 0.81 - NS).  Med-Size LAD - prox-mid long diffuse Mod-Severe Ca++ plaque (~50-69%, ?>70%): CTFFR p 0.95, m 0.88, d 0.86  -NS.  CTFFR LCx 0.94, OM1 0.90 - NS. (NS=Not Significant).   Diabetes mellitus type II, non insulin dependent (HCC)    Hypertension    Morbid obesity (HCC)    BMI of 38.5 with multiple risk factors.   OSA on CPAP    Pulmonary emboli (HCC) 10/2019   Chest CTA-4 Phadke opacification of main PA but there is partially occlusive main posterior RLL and segmental/segmental branches.  Small thrombus noted in the anterior right middle lobe.  No RV strain.  Scattered aortic atherosclerosis involving great vessels.  Coronary calcification noted.   RLS (restless legs syndrome)      Family History  Problem Relation Age of Onset   Colon cancer Mother    Emphysema Father        smoked   Diverticulitis Sister    Diabetes Brother    Diabetes Brother    Heart Problems Maternal Grandmother    Breast cancer Daughter    Lupus Son    CAD Neg Hx      Past Surgical History:  Procedure Laterality Date   BOWEL RESECTION N/A 10/18/2020   Procedure: SMALL BOWEL RESECTION;  Surgeon: Lucretia Roers, MD;  Location: [facility];  Service: General;  Laterality: N/A;   Cataract surgery Right    COLONOSCOPY WITH PROPOFOL N/A 09/27/2020   Procedure: COLONOSCOPY WITH PROPOFOL;  Surgeon:  Malissa Hippo, MD;  Location: [facility];  Service: Endoscopy;  Laterality: N/A;  1:15   COLOSTOMY REVERSAL N/A 10/18/2020   Procedure: COLOSTOMY REVERSAL;  Surgeon: Lucretia Roers, MD;  Location: [facility];  Service: General;  Laterality: N/A;   LAPAROTOMY N/A 06/21/2020   Procedure: EXPLORATORY LAPAROTOMY,  bowel resection, creation ostomy;  Surgeon: Lucretia Roers, MD;  Location: [facility];  Service: General;  Laterality: N/A;   POLYPECTOMY  09/27/2020   Procedure: POLYPECTOMY;  Surgeon: Malissa Hippo, MD;  Location: [facility];  Service: Endoscopy;;   TRANSTHORACIC ECHOCARDIOGRAM  10/20/2020   EF 60 to 65%.  Moderate LVH at least GR 1 DD.  Mild to moderate LA dilation.  Moderate aortic valve calcification-sclerosis  with no stenosis.  Normal RAP.   VENTRAL HERNIA REPAIR N/A 09/02/2022   Procedure: HERNIA REPAIR VENTRAL ADULT WITH  MESH;  Surgeon: Lucretia Roers, MD;  Location: [facility];  Service: General;  Laterality: N/A;    Social History   Socioeconomic History   Marital status: Legally Separated    Spouse name: Not on file   Number of children: 3   Years of education: Not on file   Highest education level: Not on file  Occupational History   Occupation: retired  Tobacco Use   Smoking status: Former    Packs/day: 2.00    Years: 55.00    Additional pack years: 0.00    Total pack years: 110.00    Types: Cigarettes    Quit date: 11/18/2001    Years since quitting: 21.4   Smokeless tobacco: Former  Building services engineer Use: Never used  Substance and Sexual Activity   Alcohol use: Yes    Comment: sometimes   Drug use: Never   Sexual activity: Not Currently  Other Topics Concern   Not on file  Social History Narrative   Not on file   Social Determinants of Health   Financial Resource Strain: Not on file  Food Insecurity: No Food Insecurity (02/01/2023)   Hunger Vital Sign    Worried About Running Out of Food in the Last Year: Never true    Ran Out of Food in the Last Year: Never true  Transportation Needs: No Transportation Needs (02/01/2023)   PRAPARE - Administrator, Civil Service (Medical): No    Lack of Transportation (Non-Medical): No  Physical Activity: Not on file  Stress: Not on file  Social Connections: Not on file  Intimate Partner Violence: Not At Risk (02/01/2023)   Humiliation, Afraid, Rape, and Kick questionnaire    Fear of Current or Ex-Partner: No    Emotionally Abused: No    Physically Abused: No    Sexually Abused: No     Allergies  Allergen Reactions   Ancef [Cefazolin] Itching and Rash   Other     Patient reports he was allergic to something in an IV he was given but does not know what the substance was. As of 01/19/2020      Outpatient  Medications Prior to Visit  Medication Sig Dispense Refill   apixaban (ELIQUIS) 2.5 MG TABS tablet Take 1 tablet (2.5 mg total) by mouth 2 (two) times daily. 60 tablet    diltiazem (CARDIZEM CD) 360 MG 24 hr capsule Take 1 capsule (360 mg total) by mouth daily. 30 capsule 2   hydroxypropyl methylcellulose / hypromellose (ISOPTO TEARS / GONIOVISC) 2.5 % ophthalmic solution Place 1 drop into both eyes as needed  for dry eyes.     Mepolizumab (NUCALA) 100 MG/ML SOAJ Inject 1 mL (100 mg total) into the skin every 28 (twenty-eight) days. 3 mL 1   metFORMIN (GLUCOPHAGE-XR) 500 MG 24 hr tablet Take 500 mg by mouth every morning.     metoprolol tartrate (LOPRESSOR) 25 MG tablet Take 1 tablet (25 mg total) by mouth 2 (two) times daily. 60 tablet 3   nitroGLYCERIN (NITROSTAT) 0.4 MG SL tablet Place 0.4 mg under the tongue every 5 (five) minutes as needed for chest pain.     NON FORMULARY Pt uses a cpap nightly     potassium chloride SA (KLOR-CON M) 20 MEQ tablet Take 20 mEq by mouth daily.     rosuvastatin (CRESTOR) 40 MG tablet TAKE 1 TABLET BY MOUTH ONCE DAILY. 90 tablet 3   Spacer/Aero-Holding Chambers (AEROCHAMBER MV) inhaler Use as instructed 1 each 0   spironolactone (ALDACTONE) 25 MG tablet Take 0.5 tablets (12.5 mg total) by mouth daily. 30 tablet 2   tamsulosin (FLOMAX) 0.4 MG CAPS capsule Take 1 capsule (0.4 mg total) by mouth daily. (Patient taking differently: Take 0.4 mg by mouth at bedtime.) 30 capsule 1   torsemide (DEMADEX) 20 MG tablet Take 2 tablets (40 mg total) by mouth 2 (two) times daily. 120 tablet 2   albuterol (VENTOLIN HFA) 108 (90 Base) MCG/ACT inhaler Inhale 1 puff into the lungs every 6 (six) hours as needed for wheezing or shortness of breath.     BREZTRI AEROSPHERE 160-9-4.8 MCG/ACT AERO INHALE 2 PUFFS INTO THE LUNG IN THE MORNING AND AT BEDTIME. 10.7 g 0   No facility-administered medications prior to visit.       Objective:   Physical Exam:  General appearance: 82  y.o., male, NAD, conversant  Eyes: anicteric sclerae; PERRL, tracking appropriately HENT: NCAT; MMM Neck: Trachea midline; no lymphadenopathy, no JVD Lungs: good air movement, with normal work of breathing CV: RRR, no murmur  Abdomen: Soft, non-tender; non-distended, BS present  Extremities: 1-2+ble edema, warm, venous stasis changes Skin: Normal turgor and texture; no rash Psych: Appropriate affect Neuro: Alert and oriented to person and place, no focal deficit     Vitals:   05/02/23 1346  BP: 118/70  Pulse: 74  Temp: 97.8 F (36.6 C)  TempSrc: Oral  SpO2: 97%  Weight: 267 lb (121.1 kg)  Height: 5\' 9"  (1.753 m)        97% on RA BMI Readings from Last 3 Encounters:  05/02/23 39.43 kg/m  04/04/23 40.23 kg/m  02/20/23 40.02 kg/m   Wt Readings from Last 3 Encounters:  05/02/23 267 lb (121.1 kg)  04/04/23 272 lb 6.4 oz (123.6 kg)  02/20/23 271 lb (122.9 kg)     CBC    Component Value Date/Time   WBC 10.4 02/05/2023 0048   RBC 5.03 02/05/2023 0048   HGB 16.0 02/05/2023 0048   HCT 44.5 02/05/2023 0048   PLT 243 02/05/2023 0048   MCV 88.5 02/05/2023 0048   MCH 31.8 02/05/2023 0048   MCHC 36.0 02/05/2023 0048   RDW 14.6 02/05/2023 0048   LYMPHSABS 1.6 01/30/2023 0748   MONOABS 0.6 01/30/2023 0748   EOSABS 0.0 01/30/2023 0748   BASOSABS 0.0 01/30/2023 0748    Remarkable eosinophilia historically  Chest Imaging: CTA Chest 2020 with a little upper lobe emphysema and bronchial wall thickening  CT A/P 06/15/22 lung bases with a few foci of scar otherwise unremarkable  Pulmonary Functions Testing Results:    Latest Ref  Rng & Units 06/07/2022   12:49 PM  PFT Results  FVC-Pre L 2.61   FVC-Predicted Pre % 77   FVC-Post L 2.60   FVC-Predicted Post % 76   Pre FEV1/FVC % % 67   Post FEV1/FCV % % 67   FEV1-Pre L 1.75   FEV1-Predicted Pre % 73   FEV1-Post L 1.73   DLCO uncorrected ml/min/mmHg 16.34   DLCO UNC% % 76   DLCO corrected ml/min/mmHg 16.34    DLCO COR %Predicted % 76   DLVA Predicted % 87   TLC L 5.60   TLC % Predicted % 89   RV % Predicted % 116      Echocardiogram:   TTE 10/2020:  1. Left ventricular ejection fraction, by estimation, is 60 to 65%. The  left ventricle has normal function. The left ventricle has no regional  wall motion abnormalities. There is moderate left ventricular hypertrophy.  Left ventricular diastolic  parameters are indeterminate.   2. Right ventricular systolic function is normal. The right ventricular  size is normal.   3. Left atrial size was mild to moderately dilated.   4. The mitral valve is normal in structure. No evidence of mitral valve  regurgitation. No evidence of mitral stenosis.   5. The aortic valve has an indeterminant number of cusps. There is  moderate calcification of the aortic valve. There is moderate thickening  of the aortic valve. Aortic valve regurgitation is not visualized. No  aortic stenosis is present.   6. The inferior vena cava is normal in size with greater than 50%  respiratory variability, suggesting right atrial pressure of 3 mmHg.      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 Excellent response to nucala, has weaned off of steroids with good control.  # OSA on CPAP [facility] is DME supplier  Plan: - continue nucala  - stay on breztri 2 puffs twice daily, rinse mouth/brush tongue after each use - albuterol as needed  - if feeling watery eyes, sinus congestion despite above measures, consider over the counter xyzal  - request download of CPAP from [facility] next visit - spirometry next visit - see you in 3 months with Dr. Celine Mans or sooner if need be!    Vincent Cooper Person, MD [facility] 05/02/2023 2:16 PM

## 2023-05-02 ENCOUNTER — Ambulatory Visit (INDEPENDENT_AMBULATORY_CARE_PROVIDER_SITE_OTHER): Payer: Medicare HMO | Admitting: Student

## 2023-05-02 ENCOUNTER — Encounter: Payer: Self-pay | Admitting: Student

## 2023-05-02 VITALS — BP 118/70 | HR 74 | Temp 97.8°F | Ht 69.0 in | Wt 267.0 lb

## 2023-05-02 DIAGNOSIS — J4550 Severe persistent asthma, uncomplicated: Secondary | ICD-10-CM | POA: Diagnosis not present

## 2023-05-02 DIAGNOSIS — R0609 Other forms of dyspnea: Secondary | ICD-10-CM | POA: Diagnosis not present

## 2023-05-02 DIAGNOSIS — J4489 Other specified chronic obstructive pulmonary disease: Secondary | ICD-10-CM

## 2023-05-02 MED ORDER — BREZTRI AEROSPHERE 160-9-4.8 MCG/ACT IN AERO: 2.0000 | INHALATION_SPRAY | Freq: Two times a day (BID) | RESPIRATORY_TRACT | 11 refills | Status: DC

## 2023-05-02 MED ORDER — ALBUTEROL SULFATE HFA 108 (90 BASE) MCG/ACT IN AERS: 1.0000 | INHALATION_SPRAY | Freq: Four times a day (QID) | RESPIRATORY_TRACT | 11 refills | Status: AC | PRN

## 2023-05-02 NOTE — Patient Instructions (Signed)
-   continue nucala injection  - stay on breztri 2 puffs twice daily, rinse mouth/brush tongue after each use - albuterol as needed  - if feeling watery eyes, sinus congestion despite above measures, consider over the counter xyzal  - see you in 3 months or sooner if need be! 

## 2023-05-12 DIAGNOSIS — N1831 Chronic kidney disease, stage 3a: Secondary | ICD-10-CM | POA: Diagnosis not present

## 2023-05-12 DIAGNOSIS — E7849 Other hyperlipidemia: Secondary | ICD-10-CM | POA: Diagnosis not present

## 2023-05-12 DIAGNOSIS — E1122 Type 2 diabetes mellitus with diabetic chronic kidney disease: Secondary | ICD-10-CM | POA: Diagnosis not present

## 2023-05-12 DIAGNOSIS — N183 Chronic kidney disease, stage 3 unspecified: Secondary | ICD-10-CM | POA: Diagnosis not present

## 2023-05-14 ENCOUNTER — Other Ambulatory Visit (HOSPITAL_COMMUNITY): Payer: Self-pay

## 2023-05-15 DIAGNOSIS — E1122 Type 2 diabetes mellitus with diabetic chronic kidney disease: Secondary | ICD-10-CM | POA: Diagnosis not present

## 2023-05-15 DIAGNOSIS — G4730 Sleep apnea, unspecified: Secondary | ICD-10-CM | POA: Diagnosis not present

## 2023-05-15 DIAGNOSIS — K5790 Diverticulosis of intestine, part unspecified, without perforation or abscess without bleeding: Secondary | ICD-10-CM | POA: Diagnosis not present

## 2023-05-15 DIAGNOSIS — N138 Other obstructive and reflux uropathy: Secondary | ICD-10-CM | POA: Diagnosis not present

## 2023-05-15 DIAGNOSIS — E7849 Other hyperlipidemia: Secondary | ICD-10-CM | POA: Diagnosis not present

## 2023-05-15 DIAGNOSIS — Z86711 Personal history of pulmonary embolism: Secondary | ICD-10-CM | POA: Diagnosis not present

## 2023-05-15 DIAGNOSIS — I4821 Permanent atrial fibrillation: Secondary | ICD-10-CM | POA: Diagnosis not present

## 2023-05-15 DIAGNOSIS — R609 Edema, unspecified: Secondary | ICD-10-CM | POA: Diagnosis not present

## 2023-05-16 ENCOUNTER — Other Ambulatory Visit (HOSPITAL_COMMUNITY): Payer: Self-pay

## 2023-05-17 ENCOUNTER — Other Ambulatory Visit: Payer: Self-pay

## 2023-05-17 ENCOUNTER — Encounter (HOSPITAL_COMMUNITY): Payer: Self-pay | Admitting: Emergency Medicine

## 2023-05-17 ENCOUNTER — Emergency Department (HOSPITAL_COMMUNITY): Payer: Medicare HMO

## 2023-05-17 ENCOUNTER — Emergency Department (HOSPITAL_COMMUNITY)
Admission: EM | Admit: 2023-05-17 | Discharge: 2023-05-17 | Disposition: A | Discharge status: Home / Self Care | Payer: Medicare HMO | Attending: Emergency Medicine | Admitting: Emergency Medicine

## 2023-05-17 DIAGNOSIS — I517 Cardiomegaly: Secondary | ICD-10-CM | POA: Diagnosis not present

## 2023-05-17 DIAGNOSIS — Z7901 Long term (current) use of anticoagulants: Secondary | ICD-10-CM | POA: Insufficient documentation

## 2023-05-17 DIAGNOSIS — I2510 Atherosclerotic heart disease of native coronary artery without angina pectoris: Secondary | ICD-10-CM | POA: Insufficient documentation

## 2023-05-17 DIAGNOSIS — J449 Chronic obstructive pulmonary disease, unspecified: Secondary | ICD-10-CM | POA: Insufficient documentation

## 2023-05-17 DIAGNOSIS — R609 Edema, unspecified: Secondary | ICD-10-CM | POA: Diagnosis not present

## 2023-05-17 DIAGNOSIS — R079 Chest pain, unspecified: Secondary | ICD-10-CM | POA: Diagnosis not present

## 2023-05-17 DIAGNOSIS — I4891 Unspecified atrial fibrillation: Secondary | ICD-10-CM | POA: Insufficient documentation

## 2023-05-17 DIAGNOSIS — R0789 Other chest pain: Secondary | ICD-10-CM | POA: Insufficient documentation

## 2023-05-17 DIAGNOSIS — R0689 Other abnormalities of breathing: Secondary | ICD-10-CM | POA: Diagnosis not present

## 2023-05-17 LAB — HEPATIC FUNCTION PANEL
ALT: 29 U/L (ref 0–44)
AST: 22 U/L (ref 15–41)
Albumin: 3.3 g/dL — ABNORMAL LOW (ref 3.5–5.0)
Alkaline Phosphatase: 42 U/L (ref 38–126)
Bilirubin, Direct: 0.3 mg/dL — ABNORMAL HIGH (ref 0.0–0.2)
Indirect Bilirubin: 1.2 mg/dL — ABNORMAL HIGH (ref 0.3–0.9)
Total Bilirubin: 1.5 mg/dL — ABNORMAL HIGH (ref 0.3–1.2)
Total Protein: 6.2 g/dL — ABNORMAL LOW (ref 6.5–8.1)

## 2023-05-17 LAB — CBC
HCT: 43.5 % (ref 39.0–52.0)
Hemoglobin: 14.8 g/dL (ref 13.0–17.0)
MCH: 32.8 pg (ref 26.0–34.0)
MCHC: 34 g/dL (ref 30.0–36.0)
MCV: 96.5 fL (ref 80.0–100.0)
Platelets: 188 10*3/uL (ref 150–400)
RBC: 4.51 MIL/uL (ref 4.22–5.81)
RDW: 16.1 % — ABNORMAL HIGH (ref 11.5–15.5)
WBC: 7.7 10*3/uL (ref 4.0–10.5)
nRBC: 0 % (ref 0.0–0.2)

## 2023-05-17 LAB — BASIC METABOLIC PANEL
Anion gap: 11 (ref 5–15)
BUN: 22 mg/dL (ref 8–23)
CO2: 25 mmol/L (ref 22–32)
Calcium: 8.2 mg/dL — ABNORMAL LOW (ref 8.9–10.3)
Chloride: 99 mmol/L (ref 98–111)
Creatinine, Ser: 1.3 mg/dL — ABNORMAL HIGH (ref 0.61–1.24)
GFR, Estimated: 55 mL/min — ABNORMAL LOW (ref >60)
Glucose, Bld: 154 mg/dL — ABNORMAL HIGH (ref 70–99)
Potassium: 3.9 mmol/L (ref 3.5–5.1)
Sodium: 135 mmol/L (ref 135–145)

## 2023-05-17 LAB — TROPONIN I (HIGH SENSITIVITY)
Troponin I (High Sensitivity): 4 ng/L (ref <18)
Troponin I (High Sensitivity): 4 ng/L (ref <18)

## 2023-05-17 NOTE — Discharge Instructions (Signed)
Follow-up with your cardiologist next week.  If you have any more chest pain then you should take a nitroglycerin and come to the hospital for evaluation

## 2023-05-17 NOTE — ED Triage Notes (Signed)
Pt to ER via EMS from home with c/o chest pain on set around 7AM.  Pt describes pain as sharp in nature and midsternal.  Pt took 3 of his home ntg, relief after 3rd.  Pt denies SHOB, n/v or other associated symptoms.  Pt states he is pain free at this time.

## 2023-05-17 NOTE — ED Provider Notes (Signed)
[place] EMERGENCY DEPARTMENT AT [facility] Provider Note   CSN: [id number] Arrival date & time: 05/17/23  0830     History {Add pertinent medical, surgical, social history, OB history to HPI:1} Chief Complaint  Patient presents with   Chest Pain    Vincent Cooper is a 82 y.o. male.  Patient has a history of coronary artery disease and is on Eliquis for atrial fibs.  He also has COPD.  He states he had some chest pain today for about 30 minutes that was relieved by nitroglycerin.   Chest Pain      Home Medications Prior to Admission medications   Medication Sig Start Date End Date Taking? Authorizing Provider  albuterol (VENTOLIN HFA) 108 (90 Base) MCG/ACT inhaler Inhale 1 puff into the lungs every 6 (six) hours as needed for wheezing or shortness of breath. 05/02/23   Omar Person, MD  apixaban (ELIQUIS) 2.5 MG TABS tablet Take 1 tablet (2.5 mg total) by mouth 2 (two) times daily. 09/05/22   Lucretia Roers, MD  Budeson-Glycopyrrol-Formoterol (BREZTRI AEROSPHERE) 160-9-4.8 MCG/ACT AERO Inhale 2 puffs into the lungs in the morning and at bedtime. 05/02/23   Omar Person, MD  diltiazem (CARDIZEM CD) 360 MG 24 hr capsule Take 1 capsule (360 mg total) by mouth daily. 02/03/23   Vassie Loll, MD  hydroxypropyl methylcellulose / hypromellose (ISOPTO TEARS / GONIOVISC) 2.5 % ophthalmic solution Place 1 drop into both eyes as needed for dry eyes.    [provider]  Mepolizumab (NUCALA) 100 MG/ML SOAJ Inject 1 mL (100 mg total) into the skin every 28 (twenty-eight) days. 04/17/23   Omar Person, MD  metFORMIN (GLUCOPHAGE-XR) 500 MG 24 hr tablet Take 500 mg by mouth every morning. 02/04/23   [provider]  metoprolol tartrate (LOPRESSOR) 25 MG tablet Take 1 tablet (25 mg total) by mouth 2 (two) times daily. 06/29/20   Shon Hale, MD  nitroGLYCERIN (NITROSTAT) 0.4 MG SL tablet Place 0.4 mg under the tongue every 5 (five) minutes as  needed for chest pain.    [provider]  NON FORMULARY Pt uses a cpap nightly    [provider]  potassium chloride SA (KLOR-CON M) 20 MEQ tablet Take 20 mEq by mouth daily. 01/21/23   [provider]  rosuvastatin (CRESTOR) 40 MG tablet TAKE 1 TABLET BY MOUTH ONCE DAILY. 09/13/22   Marykay Lex, MD  Spacer/Aero-Holding Chambers (AEROCHAMBER MV) inhaler Use as instructed 04/12/22   Omar Person, MD  spironolactone (ALDACTONE) 25 MG tablet Take 0.5 tablets (12.5 mg total) by mouth daily. 02/02/23 02/02/24  Vassie Loll, MD  tamsulosin (FLOMAX) 0.4 MG CAPS capsule Take 1 capsule (0.4 mg total) by mouth daily. Patient taking differently: Take 0.4 mg by mouth at bedtime. 06/30/20   Shon Hale, MD  torsemide (DEMADEX) 20 MG tablet Take 2 tablets (40 mg total) by mouth 2 (two) times daily. 02/02/23   Vassie Loll, MD      Allergies    Ancef [cefazolin] and Other    Review of Systems   Review of Systems  Cardiovascular:  Positive for chest pain.    Physical Exam Updated Vital Signs BP 121/88   Pulse 92   Temp 97.8 F (36.6 C)   Resp 15   Ht 5\' 9"  (1.753 m)   Wt 119.7 kg   SpO2 97%   BMI 38.99 kg/m  Physical Exam  ED Results / Procedures / Treatments   Labs (  all labs ordered are listed, but only abnormal results are displayed) Labs Reviewed  BASIC METABOLIC PANEL - Abnormal; Notable for the following components:      Result Value   Glucose, Bld 154 (*)    Creatinine, Ser 1.30 (*)    Calcium 8.2 (*)    GFR, Estimated 55 (*)    All other components within normal limits  CBC - Abnormal; Notable for the following components:   RDW 16.1 (*)    All other components within normal limits  HEPATIC FUNCTION PANEL - Abnormal; Notable for the following components:   Total Protein 6.2 (*)    Albumin 3.3 (*)    Total Bilirubin 1.5 (*)    Bilirubin, Direct 0.3 (*)    Indirect Bilirubin 1.2 (*)    All other components within normal limits   TROPONIN I (HIGH SENSITIVITY)  TROPONIN I (HIGH SENSITIVITY)    EKG EKG Interpretation Date/Time:  Saturday May 17 2023 08:38:28 EDT Ventricular Rate:  103 PR Interval:    QRS Duration:  92 QT Interval:  346 QTC Calculation: 414 R Axis:   63  Text Interpretation: Atrial fibrillation Low voltage, precordial leads Borderline T abnormalities, anterior leads Confirmed by Bethann Berkshire [phone]) on 05/17/2023 1:13:04 PM  Radiology DG Chest Port 1 View  Result Date: 05/17/2023 CLINICAL DATA:  Chest pain. EXAM: PORTABLE CHEST 1 VIEW COMPARISON:  01/30/2023 FINDINGS: Lordotic technique is demonstrated. Lungs are adequately inflated without focal airspace consolidation or effusion. Mild stable cardiomegaly. Remainder the exam is unchanged. IMPRESSION: 1. No acute cardiopulmonary disease. 2. Mild stable cardiomegaly. Electronically Signed   By: Elberta Fortis M.D.   On: 05/17/2023 09:24    Procedures Procedures  {Document cardiac monitor, telemetry assessment procedure when appropriate:1}  Medications Ordered in ED Medications - No data to display  ED Course/ Medical Decision Making/ A&P  Patient has been chest pain-free since in the emergency department and actually he no longer had pain when the paramedics picked him up.  2 troponins have been normal.  EKG shows no acute changes.  Patient prefers to be discharged home.  He was offered admission to the hospital with observation {   Click here for ABCD2, HEART and other calculatorsREFRESH Note before signing :1}                          Medical Decision Making Amount and/or Complexity of Data Reviewed Labs: ordered. Radiology: ordered.   Chest pain that was relieved by nitroglycerin.  He will follow-up with his cardiologist this week or return if problems  {Document critical care time when appropriate:1} {Document review of labs and clinical decision tools ie heart score, Chads2Vasc2 etc:1}  {Document your independent review of  radiology images, and any outside records:1} {Document your discussion with family members, caretakers, and with consultants:1} {Document social determinants of health affecting pt's care:1} {Document your decision making why or why not admission, treatments were needed:1} Final Clinical Impression(s) / ED Diagnoses Final diagnoses:  Chest wall pain    Rx / DC Orders ED Discharge Orders     None

## 2023-05-19 DIAGNOSIS — R457 State of emotional shock and stress, unspecified: Secondary | ICD-10-CM | POA: Diagnosis not present

## 2023-05-19 DIAGNOSIS — R001 Bradycardia, unspecified: Secondary | ICD-10-CM | POA: Diagnosis not present

## 2023-05-19 DIAGNOSIS — R0689 Other abnormalities of breathing: Secondary | ICD-10-CM | POA: Diagnosis not present

## 2023-05-20 ENCOUNTER — Emergency Department (HOSPITAL_COMMUNITY)
Admission: EM | Admit: 2023-05-20 | Discharge: 2023-05-20 | Disposition: A | Discharge status: Home / Self Care | Payer: Medicare HMO | Attending: Emergency Medicine | Admitting: Emergency Medicine

## 2023-05-20 ENCOUNTER — Emergency Department (HOSPITAL_COMMUNITY): Payer: Medicare HMO

## 2023-05-20 ENCOUNTER — Other Ambulatory Visit: Payer: Self-pay

## 2023-05-20 DIAGNOSIS — Z7984 Long term (current) use of oral hypoglycemic drugs: Secondary | ICD-10-CM | POA: Insufficient documentation

## 2023-05-20 DIAGNOSIS — J449 Chronic obstructive pulmonary disease, unspecified: Secondary | ICD-10-CM | POA: Insufficient documentation

## 2023-05-20 DIAGNOSIS — E119 Type 2 diabetes mellitus without complications: Secondary | ICD-10-CM | POA: Diagnosis not present

## 2023-05-20 DIAGNOSIS — Z79899 Other long term (current) drug therapy: Secondary | ICD-10-CM | POA: Insufficient documentation

## 2023-05-20 DIAGNOSIS — R5383 Other fatigue: Secondary | ICD-10-CM | POA: Diagnosis not present

## 2023-05-20 DIAGNOSIS — R109 Unspecified abdominal pain: Secondary | ICD-10-CM | POA: Diagnosis not present

## 2023-05-20 DIAGNOSIS — R1084 Generalized abdominal pain: Secondary | ICD-10-CM | POA: Diagnosis not present

## 2023-05-20 DIAGNOSIS — I2510 Atherosclerotic heart disease of native coronary artery without angina pectoris: Secondary | ICD-10-CM | POA: Insufficient documentation

## 2023-05-20 DIAGNOSIS — K828 Other specified diseases of gallbladder: Secondary | ICD-10-CM | POA: Diagnosis not present

## 2023-05-20 DIAGNOSIS — E669 Obesity, unspecified: Secondary | ICD-10-CM | POA: Insufficient documentation

## 2023-05-20 DIAGNOSIS — R188 Other ascites: Secondary | ICD-10-CM | POA: Diagnosis not present

## 2023-05-20 DIAGNOSIS — I10 Essential (primary) hypertension: Secondary | ICD-10-CM | POA: Diagnosis not present

## 2023-05-20 DIAGNOSIS — Z0389 Encounter for observation for other suspected diseases and conditions ruled out: Secondary | ICD-10-CM | POA: Diagnosis not present

## 2023-05-20 DIAGNOSIS — R110 Nausea: Secondary | ICD-10-CM | POA: Diagnosis not present

## 2023-05-20 DIAGNOSIS — K8020 Calculus of gallbladder without cholecystitis without obstruction: Secondary | ICD-10-CM | POA: Diagnosis not present

## 2023-05-20 DIAGNOSIS — Z6841 Body Mass Index (BMI) 40.0 and over, adult: Secondary | ICD-10-CM | POA: Insufficient documentation

## 2023-05-20 DIAGNOSIS — K5730 Diverticulosis of large intestine without perforation or abscess without bleeding: Secondary | ICD-10-CM | POA: Diagnosis not present

## 2023-05-20 LAB — TROPONIN I (HIGH SENSITIVITY)
Troponin I (High Sensitivity): 5 ng/L (ref <18)
Troponin I (High Sensitivity): 4 ng/L (ref <18)

## 2023-05-20 LAB — BASIC METABOLIC PANEL
Anion gap: 11 (ref 5–15)
BUN: 33 mg/dL — ABNORMAL HIGH (ref 8–23)
CO2: 24 mmol/L (ref 22–32)
Calcium: 8.2 mg/dL — ABNORMAL LOW (ref 8.9–10.3)
Chloride: 97 mmol/L — ABNORMAL LOW (ref 98–111)
Creatinine, Ser: 1.78 mg/dL — ABNORMAL HIGH (ref 0.61–1.24)
GFR, Estimated: 38 mL/min — ABNORMAL LOW (ref >60)
Glucose, Bld: 205 mg/dL — ABNORMAL HIGH (ref 70–99)
Potassium: 4.9 mmol/L (ref 3.5–5.1)
Sodium: 132 mmol/L — ABNORMAL LOW (ref 135–145)

## 2023-05-20 LAB — URINALYSIS, ROUTINE W REFLEX MICROSCOPIC
Bilirubin Urine: NEGATIVE
Glucose, UA: NEGATIVE mg/dL
Ketones, ur: NEGATIVE mg/dL
Nitrite: NEGATIVE
Protein, ur: 100 mg/dL — AB
Specific Gravity, Urine: 1.021 (ref 1.005–1.030)
pH: 6 (ref 5.0–8.0)

## 2023-05-20 LAB — LIPASE, BLOOD: Lipase: 31 U/L (ref 11–51)

## 2023-05-20 LAB — CBG MONITORING, ED: Glucose-Capillary: 208 mg/dL — ABNORMAL HIGH (ref 70–99)

## 2023-05-20 LAB — CBC
HCT: 40.7 % (ref 39.0–52.0)
Hemoglobin: 14.1 g/dL (ref 13.0–17.0)
MCH: 32.9 pg (ref 26.0–34.0)
MCHC: 34.6 g/dL (ref 30.0–36.0)
MCV: 95.1 fL (ref 80.0–100.0)
Platelets: 186 10*3/uL (ref 150–400)
RBC: 4.28 MIL/uL (ref 4.22–5.81)
RDW: 16.2 % — ABNORMAL HIGH (ref 11.5–15.5)
WBC: 11.2 10*3/uL — ABNORMAL HIGH (ref 4.0–10.5)
nRBC: 0 % (ref 0.0–0.2)

## 2023-05-20 LAB — HEPATIC FUNCTION PANEL
ALT: 26 U/L (ref 0–44)
AST: 21 U/L (ref 15–41)
Albumin: 3.4 g/dL — ABNORMAL LOW (ref 3.5–5.0)
Alkaline Phosphatase: 45 U/L (ref 38–126)
Bilirubin, Direct: 0.3 mg/dL — ABNORMAL HIGH (ref 0.0–0.2)
Indirect Bilirubin: 1.5 mg/dL — ABNORMAL HIGH (ref 0.3–0.9)
Total Bilirubin: 1.8 mg/dL — ABNORMAL HIGH (ref 0.3–1.2)
Total Protein: 6.5 g/dL (ref 6.5–8.1)

## 2023-05-20 MED ORDER — TECHNETIUM TC 99M MEBROFENIN IV KIT
5.5000 | PACK | Freq: Once | INTRAVENOUS | Status: AC | PRN
  Administered 2023-05-20: 5.5 via INTRAVENOUS

## 2023-05-20 MED ORDER — ONDANSETRON 4 MG PO TBDP: 4.0000 mg | ORAL_TABLET | Freq: Three times a day (TID) | ORAL | 0 refills | Status: DC | PRN

## 2023-05-20 NOTE — ED Provider Notes (Signed)
  Physical Exam  BP (!) 115/98   Pulse 88   Temp 98 F (36.7 C)   Resp (!) 22   Ht 5\' 9"  (1.753 m)   Wt 126.3 kg   SpO2 95%   BMI 41.11 kg/m   Physical Exam  Procedures  Procedures  ED Course / MDM   Clinical Course as of 05/20/23 1653  Tue May 20, 2023  0701 Assumed care from Dr Wilkie Aye. 82 yo M history of atrial fibrillation on Eliquis who presented with abdominal bloating. Got workedup for acs several days ago. Has a bloated feeling over abdomen that started when he went to sleep. Had unremarkable physical exam. Has elevated bilirubin. Ct with possibly early acute cholecystitis. Getting US of the RUQ. Has been having these episodes for weeks.  Also feels generally weak and nauseous.  EKG and troponin today unremarkable. [RP]  0808 Dr Robyne Peers from general surgery consulted and recommends HIDA at this time.  With his Eliquis use last night would not be able to operate until Thursday.  Feels that if the HIDA scan is negative patient may be discharged. [RP]  1537 Patient had nuclear medicine (HIDA scan) that did not show any evidence of cholecystitis.  Patient reexamined and not having any right upper quadrant pain.  No persistent nausea or vomiting.  After discussions with general surgery we will have him follow-up with his primary doctor in several days to continue his workup as an outpatient.  Was given Zofran to take at home.  Return precautions discussed prior to discharge. [RP]    Clinical Course User Index [RP] Rondel Baton, MD   Medical Decision Making Amount and/or Complexity of Data Reviewed Labs: ordered. Radiology: ordered.  Risk Prescription drug management.     Rondel Baton, MD 05/20/23 [phone]

## 2023-05-20 NOTE — ED Triage Notes (Signed)
Pt BIB [facility] from home. Pt c/o not feeling well x1 month, reports he just doesn't feel like he has any energy.

## 2023-05-20 NOTE — ED Notes (Signed)
Back from ct.

## 2023-05-20 NOTE — Discharge Instructions (Signed)
You were seen for your abdominal discomfort in the emergency department.   At home, please take the zofran for any nausea that you may have.    Check your MyChart online for the results of any tests that had not resulted by the time you left the emergency department.   Follow-up with your primary doctor in 2-3 days regarding your visit.    Return immediately to the emergency department if you experience any of the following: Severe nausea or vomiting, abdominal pain, or any other concerning symptoms.    Thank you for visiting our Emergency Department. It was a pleasure taking care of you today.

## 2023-05-20 NOTE — ED Provider Notes (Signed)
[place] [facility] DEPARTMENT AT [facility] Provider Note   CSN: [id number] Arrival date & time: 05/20/23  0008     History  Chief Complaint  Patient presents with   Fatigue    Vincent Cooper is a 82 y.o. male.  HPI     This is an 82 year old male who presents with generalized not feeling well.  Patient reports that he was feeling fine and ate a normal dinner.  He went to bed and woke up with a sensation of feeling bloated and feeling like he might vomit.  He reports she does not have much energy.  Denies chest pain or shortness of breath but was here several days ago for evaluation for this.  He states "They did not find anything."  Has not had any fevers or recent illnesses.  Home Medications Prior to Admission medications   Medication Sig Start Date End Date Taking? Authorizing Provider  albuterol (VENTOLIN HFA) 108 (90 Base) MCG/ACT inhaler Inhale 1 puff into the lungs every 6 (six) hours as needed for wheezing or shortness of breath. 05/02/23   Omar Person, MD  apixaban (ELIQUIS) 2.5 MG TABS tablet Take 1 tablet (2.5 mg total) by mouth 2 (two) times daily. 09/05/22   Lucretia Roers, MD  Budeson-Glycopyrrol-Formoterol (BREZTRI AEROSPHERE) 160-9-4.8 MCG/ACT AERO Inhale 2 puffs into the lungs in the morning and at bedtime. 05/02/23   Omar Person, MD  diltiazem (CARDIZEM CD) 360 MG 24 hr capsule Take 1 capsule (360 mg total) by mouth daily. 02/03/23   Vassie Loll, MD  hydroxypropyl methylcellulose / hypromellose (ISOPTO TEARS / GONIOVISC) 2.5 % ophthalmic solution Place 1 drop into both eyes as needed for dry eyes.    [provider]  Mepolizumab (NUCALA) 100 MG/ML SOAJ Inject 1 mL (100 mg total) into the skin every 28 (twenty-eight) days. 04/17/23   Omar Person, MD  metFORMIN (GLUCOPHAGE-XR) 500 MG 24 hr tablet Take 500 mg by mouth every morning. 02/04/23   [provider]  metoprolol tartrate (LOPRESSOR) 25 MG tablet Take 1 tablet  (25 mg total) by mouth 2 (two) times daily. 06/29/20   Shon Hale, MD  nitroGLYCERIN (NITROSTAT) 0.4 MG SL tablet Place 0.4 mg under the tongue every 5 (five) minutes as needed for chest pain.    [provider]  NON FORMULARY Pt uses a cpap nightly    [provider]  potassium chloride SA (KLOR-CON M) 20 MEQ tablet Take 20 mEq by mouth daily. 01/21/23   [provider]  rosuvastatin (CRESTOR) 40 MG tablet TAKE 1 TABLET BY MOUTH ONCE DAILY. 09/13/22   Marykay Lex, MD  Spacer/Aero-Holding Chambers (AEROCHAMBER MV) inhaler Use as instructed 04/12/22   Omar Person, MD  spironolactone (ALDACTONE) 25 MG tablet Take 0.5 tablets (12.5 mg total) by mouth daily. 02/02/23 02/02/24  Vassie Loll, MD  tamsulosin (FLOMAX) 0.4 MG CAPS capsule Take 1 capsule (0.4 mg total) by mouth daily. Patient taking differently: Take 0.4 mg by mouth at bedtime. 06/30/20   Shon Hale, MD  torsemide (DEMADEX) 20 MG tablet Take 2 tablets (40 mg total) by mouth 2 (two) times daily. 02/02/23   Vassie Loll, MD      Allergies    Ancef [cefazolin] and Other    Review of Systems   Review of Systems  Constitutional:  Negative for fever.  Respiratory:  Negative for shortness of breath.   Cardiovascular:  Negative for chest pain.  Gastrointestinal:  Positive for nausea.  Negative for constipation and diarrhea.       Feeling bloated  Genitourinary:  Negative for dysuria.  Neurological:  Negative for headaches.  All other systems reviewed and are negative.   Physical Exam Updated Vital Signs BP (!) 112/51   Pulse (!) 56   Temp 98.1 F (36.7 C) (Oral)   Resp 15   Ht 1.753 m (5\' 9" )   Wt 126.3 kg   SpO2 93%   BMI 41.11 kg/m  Physical Exam Vitals and nursing note reviewed.  Constitutional:      Appearance: He is well-developed. He is obese. He is not ill-appearing.  HENT:     Head: Normocephalic and atraumatic.  Eyes:     Pupils: Pupils are equal, round, and reactive  to light.  Cardiovascular:     Rate and Rhythm: Normal rate and regular rhythm.     Heart sounds: Normal heart sounds. No murmur heard. Pulmonary:     Effort: Pulmonary effort is normal. No respiratory distress.     Breath sounds: Normal breath sounds. No wheezing.  Abdominal:     General: Bowel sounds are normal. There is distension.     Palpations: Abdomen is soft.     Tenderness: There is no abdominal tenderness. There is no guarding or rebound.  Musculoskeletal:     Cervical back: Neck supple.  Lymphadenopathy:     Cervical: No cervical adenopathy.  Skin:    General: Skin is warm and dry.  Neurological:     Mental Status: He is alert and oriented to person, place, and time.  Psychiatric:        Mood and Affect: Mood normal.     ED Results / Procedures / Treatments   Labs (all labs ordered are listed, but only abnormal results are displayed) Labs Reviewed  BASIC METABOLIC PANEL - Abnormal; Notable for the following components:      Result Value   Sodium 132 (*)    Chloride 97 (*)    Glucose, Bld 205 (*)    BUN 33 (*)    Creatinine, Ser 1.78 (*)    Calcium 8.2 (*)    GFR, Estimated 38 (*)    All other components within normal limits  CBC - Abnormal; Notable for the following components:   WBC 11.2 (*)    RDW 16.2 (*)    All other components within normal limits  URINALYSIS, ROUTINE W REFLEX MICROSCOPIC - Abnormal; Notable for the following components:   Hgb urine dipstick MODERATE (*)    Protein, ur 100 (*)    Leukocytes,Ua SMALL (*)    Bacteria, UA RARE (*)    All other components within normal limits  HEPATIC FUNCTION PANEL - Abnormal; Notable for the following components:   Albumin 3.4 (*)    Total Bilirubin 1.8 (*)    Bilirubin, Direct 0.3 (*)    Indirect Bilirubin 1.5 (*)    All other components within normal limits  CBG MONITORING, ED - Abnormal; Notable for the following components:   Glucose-Capillary 208 (*)    All other components within normal  limits  LIPASE, BLOOD  TROPONIN I (HIGH SENSITIVITY)  TROPONIN I (HIGH SENSITIVITY)    EKG None  Radiology CT ABDOMEN PELVIS WO CONTRAST  Result Date: 05/20/2023 CLINICAL DATA:  Abdominal pain, acute, nonlocalized. EXAM: CT ABDOMEN AND PELVIS WITHOUT CONTRAST TECHNIQUE: Multidetector CT imaging of the abdomen and pelvis was performed following the standard protocol without IV contrast. RADIATION DOSE REDUCTION: This exam was performed according  to the departmental dose-optimization program which includes automated exposure control, adjustment of the mA and/or kV according to patient size and/or use of iterative reconstruction technique. COMPARISON:  06/15/2022. FINDINGS: Lower chest: The heart is enlarged and few coronary artery calcifications are noted. Atelectasis is noted at the lung bases. Hepatobiliary: No focal liver abnormality is seen. No biliary ductal dilatation is seen. A stone is noted in the gallbladder. Mild fat stranding is noted about the gallbladder. Pancreas: Unremarkable. No pancreatic ductal dilatation or surrounding inflammatory changes. Spleen: Normal in size without focal abnormality. Adrenals/Urinary Tract: The adrenal glands are within normal limits. No renal calculus or hydronephrosis. The bladder is unremarkable. Stomach/Bowel: Stomach is within normal limits. Appendix appears normal. No evidence of bowel wall thickening, distention, or inflammatory changes. No free air or pneumatosis. Scattered diverticula are present along the colon without evidence of diverticulitis. Vascular/Lymphatic: Aortic atherosclerosis. No enlarged abdominal or pelvic lymph nodes. Reproductive: Prostate is unremarkable. Other: No abdominopelvic ascites. A small fluid collection is noted in the anterior abdominal wall to left of midline in the region of prior hernia repair measuring 4.7 x 0.9 cm, likely seroma. Musculoskeletal: Degenerative changes are present in the thoracolumbar spine. Old compression  deformities are noted in the lumbar spine. No acute osseous abnormality. IMPRESSION: 1. Cholelithiasis with fat stranding about the gallbladder, concerning for acute cholecystitis. Ultrasound is recommended for further evaluation. 2. Diverticulosis without diverticulitis. 3. Aortic atherosclerosis and coronary artery calcifications. Electronically Signed   By: Thornell Sartorius M.D.   On: 05/20/2023 03:47    Procedures Procedures    Medications Ordered in ED Medications - No data to display  ED Course/ Medical Decision Making/ A&P                             Medical Decision Making Amount and/or Complexity of Data Reviewed Labs: ordered. Radiology: ordered.   This patient presents to the ED for concern of abdominal bloating, generally not feeling well, this involves an extensive number of treatment options, and is a complaint that carries with it a high risk of complications and morbidity.  I considered the following differential and admission for this acute, potentially life threatening condition.  The differential diagnosis includes constipation, obstruction, gastritis, cholecystitis, atypical ACS, viral illness  MDM:    This is an 82 year old male who presents with generally not feeling well.  Reports sensation of bloating when he went to bed tonight.  He is overall nontoxic and vital signs are largely reassuring.  Physical exam is fairly benign including abdominal exam with the exception of some distention.  EKG shows no evidence of acute arrhythmia or ischemia.  He had a full ACS evaluation several days ago.  Labs are notable for mild hyponatremia.  May be some slight AKI with a creatinine of 1.78 although he has been this high in the past.  Cytosis to 11.2.  Slightly elevated bilirubin at 1.8.  Pregnant negative.  CT scan shows cholelithiasis with fat stranding at the gallbladder.  Patient is not particularly tender on exam but he is 88 and would have an unreliable abdominal exam.  Will  order ultrasound.  Patient clinically stable.  States he has minimal pain and bloating.  (Labs, imaging, consults)  Labs: I Ordered, and personally interpreted labs.  The pertinent results include: CBC, CMP,  troponin x 2  Imaging Studies ordered: I ordered imaging studies including CT abdomen pelvis I independently visualized and interpreted imaging. I agree  with the radiologist interpretation  Additional history obtained from review.  External records from outside source obtained and reviewed including recent ACS evaluation  Cardiac Monitoring: The patient was maintained on a cardiac monitor.  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 Large Dom RCA-<PDA-PAV/PL - diffuse mild-mod plaque: prox (25-49%), mid (50-69%) CTFFR (p 0.99, m 0.85, d 0.81 - NS).  Med-Size LAD - prox-mid long diffuse Mod-Severe Ca++ plaque (~50-69%, ?>70%): CTFFR p 0.95, m 0.88, d 0.86 -NS.  CTFFR LCx 0.94, OM1 0.90 - NS. (NS=Not Significant).   Diabetes mellitus type II, non insulin dependent (HCC)    Hypertension    Morbid obesity (HCC)    BMI of 38.5 with multiple risk factors.   OSA on CPAP    Pulmonary emboli (HCC) 10/2019   Chest CTA-4 Phadke opacification of main PA but there is partially occlusive main posterior RLL and segmental/segmental branches.  Small  thrombus noted in the anterior right middle lobe.  No RV strain.  Scattered aortic atherosclerosis involving great vessels.  Coronary calcification noted.   RLS (restless legs syndrome)      Medicines No orders of the defined types were placed in this encounter.   I have reviewed the patients home medicines and have made adjustments as needed  Problem List / ED Course: Problem List Items Addressed This Visit   None               Final Clinical Impression(s) / ED Diagnoses Final diagnoses:  None    Rx / DC Orders ED Discharge Orders     None         Jennene Downie, Mayer Masker, MD 05/20/23 [phone]

## 2023-05-20 NOTE — ED Notes (Signed)
Radiology called and asked him to be NPO for pain meds and anything else.

## 2023-05-20 NOTE — ED Notes (Signed)
Pt ambulatory to waiting room. Pt verbalized understanding of discharge instructions.   

## 2023-05-20 NOTE — ED Notes (Signed)
Patient transported to Radiology 

## 2023-05-28 ENCOUNTER — Ambulatory Visit: Payer: Medicare HMO | Attending: Cardiology | Admitting: Cardiology

## 2023-05-28 VITALS — BP 130/60 | HR 67 | Ht 69.0 in | Wt 267.0 lb

## 2023-05-28 DIAGNOSIS — I5032 Chronic diastolic (congestive) heart failure: Secondary | ICD-10-CM | POA: Diagnosis not present

## 2023-05-28 DIAGNOSIS — I4821 Permanent atrial fibrillation: Secondary | ICD-10-CM

## 2023-05-28 DIAGNOSIS — E1169 Type 2 diabetes mellitus with other specified complication: Secondary | ICD-10-CM | POA: Diagnosis not present

## 2023-05-28 DIAGNOSIS — I2699 Other pulmonary embolism without acute cor pulmonale: Secondary | ICD-10-CM

## 2023-05-28 DIAGNOSIS — I10 Essential (primary) hypertension: Secondary | ICD-10-CM | POA: Diagnosis not present

## 2023-05-28 DIAGNOSIS — R011 Cardiac murmur, unspecified: Secondary | ICD-10-CM | POA: Diagnosis not present

## 2023-05-28 DIAGNOSIS — I2510 Atherosclerotic heart disease of native coronary artery without angina pectoris: Secondary | ICD-10-CM

## 2023-05-28 DIAGNOSIS — Z7901 Long term (current) use of anticoagulants: Secondary | ICD-10-CM

## 2023-05-28 DIAGNOSIS — J449 Chronic obstructive pulmonary disease, unspecified: Secondary | ICD-10-CM

## 2023-05-28 DIAGNOSIS — E785 Hyperlipidemia, unspecified: Secondary | ICD-10-CM

## 2023-05-28 DIAGNOSIS — G4733 Obstructive sleep apnea (adult) (pediatric): Secondary | ICD-10-CM | POA: Diagnosis not present

## 2023-05-28 DIAGNOSIS — Z7984 Long term (current) use of oral hypoglycemic drugs: Secondary | ICD-10-CM

## 2023-05-28 MED ORDER — NITROGLYCERIN 0.4 MG SL SUBL: 0.4000 mg | SUBLINGUAL_TABLET | SUBLINGUAL | 6 refills | Status: AC | PRN

## 2023-05-28 NOTE — Patient Instructions (Addendum)
Medication Instructions:    Molli Knock to take an extra torsemide if you gain more than 3 lbs overnight.  *If you need a refill on your cardiac medications before your next appointment, please call your pharmacy*   Lab Work:  Not needed     Testing/Procedures: Not needed   Follow-Up: At [facility], you and your health needs are our priority.  As part of our continuing mission to provide you with exceptional heart care, we have created designated Provider Care Teams.  These Care Teams include your primary Cardiologist (physician) and Advanced Practice Providers (APPs -  Physician Assistants and Nurse Practitioners) who all work together to provide you with the care you need, when you need it.  We recommend signing up for the patient portal called "MyChart".  Sign up information is provided on this After Visit Summary.  MyChart is used to connect with patients for Virtual Visits (Telemedicine).  Patients are able to view lab/test results, encounter notes, upcoming appointments, etc.  Non-urgent messages can be sent to your provider as well.   To learn more about what you can do with MyChart, go to [email or web address].    Your next appointment:   6 month(s)  The format for your next appointment:   In Person  Provider:   Bernadene Person, NP  ,Marjie Skiff PA, Joni Reining  NP Then, Bryan Lemma, MD will plan to see you again in 12 month(s).

## 2023-05-28 NOTE — Progress Notes (Signed)
Cardiology Office Note:  .   Date:  06/01/2023  ID:  Vincent Cooper, DOB 1940-12-10, MRN [id number] PCP: Estanislado Pandy, MD  [facility] Providers Cardiologist:  Bryan Lemma, MD     Chief Complaint  Patient presents with   Shortness of Breath    Stable shortness of breath.  Exertional dyspnea.   Follow-up    2 months.   Atrial Fibrillation    No active symptoms.    History of Present Illness: .     Vincent Cooper is a morbidly obese 82 y.o. male  with a PMH noted below who presents here for 43-month follow-up at the request of Sasser, Clarene Critchley, MD.  Past Medical History significant for: Nonobstructive CAD. Coronary CTA with FFR 03/07/2020: Calcium score 1651 (83rd percentile).  LM moderate mixed calcified plaque?  50 to 69% mild to moderate 25 to 49% distal LAD moderate to severe plaque 50 to 69% (possible focal stenosis >70%.  LCx with OM1 moderate mixed plaque in proximal LCx and OM roughly 50 to 69%.  FFR showed no flow-limiting stenosis.  Recommend aggressive risk factor modification. Permanent Atrial Fibrillation Chronic diastolic heart failure. Echo 01/31/2023: EF 65 to 70%.  Mild LAE.  Moderate RAE.  Trivial MR.  Moderate calcification of the aortic valve.  Mild AS, peak gradient 13 mmHg.  Dilated IVC, RA pressure 8 mmHg. PE. CTA chest (PE protocol) 11/06/2019: Partially occlusive thrombus in the posterior right semental and subsegmental pulmonary artery branch and right middle lobe subsegmental branches.  No evidence of right ventricular heart strain. Echo 11/07/2019: EF 60 to 65%.  Mild LAE.  Small pericardial effusion.  Mild aortic valve sclerosis without stenosis.  Moderately elevated pulmonary artery systolic pressure.  Moderately elevated RVSP, RA pressure 8 mmHg. Hypertension. Hyperlipidemia.  Lipid panel 05/13/2022: LDL 54, HDL 87, TG 84, total 179. T2DM. COPD. OSA.   [name] was last seen on 02/20/2023 by Carlos Levering, NP as a ER/Hospital Follow-Up after  being presented for presumed CHF exacerbation.  Noted orthopnea and PND.  Troponin was negative x 2.  BNP was only 108.  Potassium was 2.8.  Admitted for IV diuresis and potassium repleted.  Discharged 3 days later.  He then returned to the ER on 02/05/2023 for hyperglycemia-blood glucose of 435.  Had just been started on metformin.  He was given IV insulin and discharged home.  Was also noted to be hypokalemic and given potassium=> at follow-up visit was doing well.  Edema and breathing all seem to be better.  Home weights are stable.  Was noting brisk diuresis on torsemide.  Indicated that his fluid overload was probably related to dietary indiscretion-eating bologna, processed meats and canned foods.  He changed his diet and is now eating less salty foods.  Noted blood sugars are running in the 140s to 150s.  Able to do household activities of washing dishes, sweeping and light housework without significant symptoms. Confirmed dietary recommendations (Salty 6 information provided).  Continued on metoprolol, torsemide and spironolactone.  BMP checked for hypokalemia.  Rate was controlled with current dose of beta-blocker and diltiazem.  Continued on Eliquis.  Lipid panel checked.   Subjective  INTERVAL HISTORY [facility], ER visit 05/17/2023: Presented with complaints of chest pain lasting about 30 minutes.  Supposedly relieved with nitroglycerin.  No radiation.  EKG showed A-fib with rate 103 bpm.  Remained chest pain-free in the ER.  Negative troponin x 2.  No acute changes on EKG.  Thought to be  musculoskeletal pain. [facility] ER visit 05/20/2023: Presented with abdominal bloating.  Elevated bilirubin with CT suggesting possible acute early cholecystitis.  RUQ ultrasound ordered.  Surgery consulted and recommended HIDA scan.  HIDA scan did not show evidence of cholecystitis.  Plan was to follow-up with PCP.  Vincent Cooper returns today overall feeling a whole lot better.  He still has exertional dyspnea and  low energy level, but it is stable if not better.  He denies any chest pain or pressure with rest or exertion.  No PND orthopnea.  Edema is there, pretty well-controlled and he does not always take his torsemide twice daily.  He takes them in the morning and then does not take the next dose on till 3 PM but does not take it after.  On occasion he may miss the afternoon dose. He has been attention to his diet, trying to reduce salty foods-stating that he may have learned his lesson from back in April.  He still does his routine housework and yard work but his sisters come by to help out with the heavier chores.  ROS:  Cardiovascular ROS: positive for - dyspnea on exertion and stable edema.  Both are relatively improved negative for - chest pain, irregular heartbeat, orthopnea, palpitations, paroxysmal nocturnal dyspnea, rapid heart rate, shortness of breath, or syncope or near syncope, TIA or amaurosis fugax, claudication. Review of Systems - Negative except occasional abdominal pain with some nausea.  Unable to lose weight.  Actually gained weight, despite changing diet.  Energy level still low.    Objective  Studies Reviewed: Marland Kitchen   EKG Interpretation Date/Time:  Wednesday May 28 2023 11:37:16 EDT Ventricular Rate:  67 PR Interval:    QRS Duration:  74 QT Interval:  412 QTC Calculation: 435 R Axis:   86  Text Interpretation: Atrial fibrillation CONTROLLED VENTRICULAR RESPONSE Septal infarct , age undetermined When compared with ECG of 17-May-2023 08:38, PREVIOUS ECG IS PRESENT Confirmed by Bryan Lemma ([id number]) on 05/28/2023 11:45:26 AM    ECHO 01/31/2023: Normal LV size and function with EF 65 to 70%.  No RWMA.  Unable to assess diastolic function.  Normal RV size and function.  Normal RVP and mildly elevated RAP.  Mild LA dilation and moderate RA dilation.  Moderate AoV calcification with mild AAS (mean AVG 13 mmHg). => Mild AS now present  Coronary CTA 03/07/2020:: CAC 1651.  Diffuse mild to  moderate mixed plaque (25-49%) prox RCA & 50 -69% mid-dist RCA (FFR ct-proximal 0.99, mid 0.85, distal 0.81-borderline).  50-69% calcified ostial LM (FFRCT 1.0), distal LM 25-49% (moderate calcified).  Prox-MidLAD Long diffuse moderate-severe calcified plaque (50-69%) & possible Focal 70%, distal LAD small (FFRct - Prox 0.95, mid 0.88, dist 0.88). Small, non-dominant LCx w/ 1 OM - proxLCx-OM2 50-69% mixed plaque (FFRct LCx 0.94, OM1 0.91)   Risk Assessment/Calculations:    CHA2DS2-VASc Score = 6   This indicates a 9.7% annual risk of stroke. The patient's score is based upon: CHF History: 1 HTN History: 1 Diabetes History: 1 Stroke History: 0 Vascular Disease History: 1 Age Score: 2 Gender Score: 0         Physical Exam:   VS:  BP 130/60   Pulse 67   Ht 5\' 9"  (1.753 m)   Wt 267 lb (121.1 kg)   SpO2 96%   BMI 39.43 kg/m    Wt Readings from Last 3 Encounters:  05/28/23 267 lb (121.1 kg)  05/20/23 278 lb 6.4 oz (126.3 kg)  05/17/23  264 lb (119.7 kg)    GEN: Well nourished, well developed in no acute distress; morbidly obese.  Well-groomed NECK: No JVD; No carotid bruits CARDIAC: Normal S1, S2; irregularly irregular, 1-2/6 SEM at RUSB otherwise no additional M/R/G.   RESPIRATORY: CTAB w/o W/R/R; nonlabored, good air movement. ABDOMEN: Soft, non-tender, non-distended EXTREMITIES:  No edema; No deformity ; diminished pedal pulses due to body habitus.   ASSESSMENT AND PLAN: .    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  Is now on DOAC for both VTE/PE and A-fib.  No bleeding issues. No evidence to suggest notable pulm hypertension concerning for CTEPH.  Continue Eliquis.  Okay to hold for procedures.      Relevant Medications   nitroGLYCERIN (NITROSTAT) 0.4 MG SL tablet   Permanent atrial fibrillation  (HCC): CHA2DS2Vasc = 5. Eliquis - Primary (Chronic)    Rate controlled with combination of low-dose Lopressor 25 mg twice daily and high-dose diltiazem 360 mg daily. Remains on reduced dose apixaban due to renal insufficiency.  (2.5 mg twice daily)      Relevant Medications   nitroGLYCERIN (NITROSTAT) 0.4 MG SL tablet   Other Relevant Orders   EKG 12-Lead (Completed)   Hyperlipidemia associated with type 2 diabetes mellitus (HCC) (Chronic)    Continue 40 mg rosuvastatin with target LDL less than 70.  Currently LDL is 60. DM-2 with last A1c of 6.80.  He is on metformin alone.  Would strongly consider GLP-1 agonist to help loss.  Also would benefit from SGLT2 inhibitor for additional diuresis and hopefully decrease torsemide dosing.      Relevant Medications   nitroGLYCERIN (NITROSTAT) 0.4 MG SL tablet   Essential hypertension (Chronic)    Blood pressure stable on current dose of metoprolol and Cardizem      Relevant Medications   nitroGLYCERIN (NITROSTAT) 0.4 MG SL tablet   Coronary artery disease, non-occlusive (Chronic)    Extensive mixed calcified and noncalcified plaque noted on Coronary CTA but by was not positive by FFRCT.  Low threshold to reassess for ischemia if he has recurrent symptoms.  Plan for now will be to continue to treat as if he has occlusive CAD: Combination beta-blocker and calcium channel blocker for rate control and antianginal benefit. Not on aspirin because of Eliquis Continue 40 mg rosuvastatin with target LDL less than 70.  Currently LDL is 60. DM-2 with last A1c of 6.80.  He is on metformin alone.  Would strongly consider GLP-1 agonist to help loss.  Also would benefit from SGLT2 inhibitor for additional diuresis and hopefully decrease torsemide dosing.      Relevant Medications   nitroGLYCERIN (NITROSTAT) 0.4 MG SL tablet   Chronic diastolic HF (heart failure) (HCC) (Chronic)    Chronic HFpEF that is exacerbated by COPD and obesity/OHS and OSA.  No PND  or orthopnea and stable edema.  His exertional dyspnea is pretty stable from COPD and obesity/deconditioning.  He is on combination of diltiazem and metoprolol which are also treating blood pressure.  Using this combination as opposed to afterload reduction agent such as ARB because the need for rate control.  Continue spironolactone 12.5 mg daily monitor closely, low threshold to increase to full 25 mg.  Currently taking torsemide 40 mg twice daily.  We discussed potentially holding the afternoon dose on occasion to see how  he does or maybe just take 1 tablet.  Additional tablets as needed for worsening dyspnea or edema/weight gain.      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  Unfortunately he has gained some weight but he is now pretty much stable at 261 pounds at home which could correlate to 267 pounds here.  Discussed importance of low calorie and portion control diet and increasing physical activity. Would strongly consider GLP-1 agonist-could be either Ozempic/Mounjaro or Wegovy/Zepbound.  Defer to PCP.      Cardiac murmur (Chronic)    Mild aortic stenosis murmur heard on exam, confirmed on Echo.  Grade consistent with mild AS, reassess in roughly 3 years.           Dispo: Return in about 6 months (around 11/28/2023) for Alternate 6 month follow-up with APP & MD.  Total time spent: 22 min spent with patient + 19 min spent charting = 41 min     Signed, Marykay Lex, MD, MS Bryan Lemma, M.D., M.S. Interventional Cardiologist  [facility]  Pager # [phone] Phone # [phone] [address] [place], [place] [address]

## 2023-06-01 ENCOUNTER — Encounter: Payer: Self-pay | Admitting: Cardiology

## 2023-06-01 NOTE — Assessment & Plan Note (Signed)
Extensive mixed calcified and noncalcified plaque noted on Coronary CTA but by was not positive by FFRCT.  Low threshold to reassess for ischemia if he has recurrent symptoms.  Plan for now will be to continue to treat as if he has occlusive CAD: Combination beta-blocker and calcium channel blocker for rate control and antianginal benefit. Not on aspirin because of Eliquis Continue 40 mg rosuvastatin with target LDL less than 70.  Currently LDL is 60. DM-2 with last A1c of 6.80.  He is on metformin alone.  Would strongly consider GLP-1 agonist to help loss.  Also would benefit from SGLT2 inhibitor for additional diuresis and hopefully decrease torsemide dosing.

## 2023-06-01 NOTE — Assessment & Plan Note (Signed)
Mild aortic stenosis murmur heard on exam, confirmed on Echo.  Grade consistent with mild AS, reassess in roughly 3 years.

## 2023-06-01 NOTE — Assessment & Plan Note (Signed)
BMI just under 40 which is a drop from before.  Unfortunately he has gained some weight but he is now pretty much stable at 261 pounds at home which could correlate to 267 pounds here.  Discussed importance of low calorie and portion control diet and increasing physical activity. Would strongly consider GLP-1 agonist-could be either Ozempic/Mounjaro or Wegovy/Zepbound.  Defer to PCP.

## 2023-06-01 NOTE — Assessment & Plan Note (Signed)
Blood pressure stable on current dose of metoprolol and Cardizem

## 2023-06-01 NOTE — Assessment & Plan Note (Signed)
Chronic HFpEF that is exacerbated by COPD and obesity/OHS and OSA.  No PND or orthopnea and stable edema.  His exertional dyspnea is pretty stable from COPD and obesity/deconditioning.  He is on combination of diltiazem and metoprolol which are also treating blood pressure.  Using this combination as opposed to afterload reduction agent such as ARB because the need for rate control.  Continue spironolactone 12.5 mg daily monitor closely, low threshold to increase to full 25 mg.  Currently taking torsemide 40 mg twice daily.  We discussed potentially holding the afternoon dose on occasion to see how he does or maybe just take 1 tablet.  Additional tablets as needed for worsening dyspnea or edema/weight gain.

## 2023-06-01 NOTE — Assessment & Plan Note (Signed)
Continue 40 mg rosuvastatin with target LDL less than 70.  Currently LDL is 60. DM-2 with last A1c of 6.80.  He is on metformin alone.  Would strongly consider GLP-1 agonist to help loss.  Also would benefit from SGLT2 inhibitor for additional diuresis and hopefully decrease torsemide dosing.

## 2023-06-01 NOTE — Assessment & Plan Note (Signed)
History multiple subsegmental PEs back in 2021.  Is now on DOAC for both VTE/PE and A-fib.  No bleeding issues. No evidence to suggest notable pulm hypertension concerning for CTEPH.  Continue Eliquis.  Okay to hold for procedures.

## 2023-06-01 NOTE — Assessment & Plan Note (Signed)
Rate controlled with combination of low-dose Lopressor 25 mg twice daily and high-dose diltiazem 360 mg daily. Remains on reduced dose apixaban due to renal insufficiency.  (2.5 mg twice daily)

## 2023-06-05 ENCOUNTER — Other Ambulatory Visit (HOSPITAL_COMMUNITY)
Admission: RE | Admit: 2023-06-05 | Discharge: 2023-06-05 | Disposition: A | Discharge status: Home / Self Care | Payer: Medicare HMO | Source: Ambulatory Visit | Attending: Nurse Practitioner | Admitting: Nurse Practitioner

## 2023-06-05 DIAGNOSIS — E1122 Type 2 diabetes mellitus with diabetic chronic kidney disease: Secondary | ICD-10-CM | POA: Insufficient documentation

## 2023-06-05 LAB — COMPREHENSIVE METABOLIC PANEL
ALT: 31 U/L (ref 0–44)
AST: 23 U/L (ref 15–41)
Albumin: 3.7 g/dL (ref 3.5–5.0)
Alkaline Phosphatase: 51 U/L (ref 38–126)
Anion gap: 8 (ref 5–15)
BUN: 25 mg/dL — ABNORMAL HIGH (ref 8–23)
CO2: 28 mmol/L (ref 22–32)
Calcium: 9 mg/dL (ref 8.9–10.3)
Chloride: 100 mmol/L (ref 98–111)
Creatinine, Ser: 1.43 mg/dL — ABNORMAL HIGH (ref 0.61–1.24)
GFR, Estimated: 49 mL/min — ABNORMAL LOW (ref >60)
Glucose, Bld: 140 mg/dL — ABNORMAL HIGH (ref 70–99)
Potassium: 3.9 mmol/L (ref 3.5–5.1)
Sodium: 136 mmol/L (ref 135–145)
Total Bilirubin: 1.6 mg/dL — ABNORMAL HIGH (ref 0.3–1.2)
Total Protein: 6.8 g/dL (ref 6.5–8.1)

## 2023-06-12 ENCOUNTER — Other Ambulatory Visit: Payer: Self-pay

## 2023-06-12 ENCOUNTER — Other Ambulatory Visit (HOSPITAL_COMMUNITY): Payer: Self-pay

## 2023-06-17 ENCOUNTER — Ambulatory Visit (INDEPENDENT_AMBULATORY_CARE_PROVIDER_SITE_OTHER): Payer: Medicare HMO | Admitting: Nurse Practitioner

## 2023-06-17 ENCOUNTER — Encounter: Payer: Self-pay | Admitting: Nurse Practitioner

## 2023-06-17 VITALS — BP 117/65 | HR 76 | Ht 69.0 in | Wt 264.6 lb

## 2023-06-17 DIAGNOSIS — E1122 Type 2 diabetes mellitus with diabetic chronic kidney disease: Secondary | ICD-10-CM | POA: Diagnosis not present

## 2023-06-17 DIAGNOSIS — N1831 Chronic kidney disease, stage 3a: Secondary | ICD-10-CM | POA: Diagnosis not present

## 2023-06-17 DIAGNOSIS — E782 Mixed hyperlipidemia: Secondary | ICD-10-CM | POA: Diagnosis not present

## 2023-06-17 DIAGNOSIS — Z7984 Long term (current) use of oral hypoglycemic drugs: Secondary | ICD-10-CM | POA: Diagnosis not present

## 2023-06-17 DIAGNOSIS — I10 Essential (primary) hypertension: Secondary | ICD-10-CM

## 2023-06-17 LAB — POCT GLYCOSYLATED HEMOGLOBIN (HGB A1C): Hemoglobin A1C: 5.5 % (ref 4.0–5.6)

## 2023-06-17 MED ORDER — METFORMIN HCL ER 500 MG PO TB24: 500.0000 mg | ORAL_TABLET | Freq: Every morning | ORAL | 1 refills | Status: DC

## 2023-06-17 NOTE — Progress Notes (Signed)
Endocrinology Follow Up Note       06/17/2023, 4:46 PM   Subjective:    Patient ID: Vincent Cooper, male    DOB: Mar 07, 1941.  Vincent Cooper is being seen in follow up after being seen in consultation for management of currently uncontrolled symptomatic diabetes requested by  Estanislado Pandy, MD.   Past Medical History:  Diagnosis Date   Chronic gout    COPD (chronic obstructive pulmonary disease) (HCC)    Oxygen dependent   Coronary artery disease due to calcified coronary lesion 03/08/2020   CORONARY CTA: Cor Ca++ Score 1651 !!: ~ 50% LM (CTFFR 1 - NS). Large Dom RCA-<PDA-PAV/PL - diffuse mild-mod plaque: prox (25-49%), mid (50-69%) CTFFR (p 0.99, m 0.85, d 0.81 - NS).  Med-Size LAD - prox-mid long diffuse Mod-Severe Ca++ plaque (~50-69%, ?>70%): CTFFR p 0.95, m 0.88, d 0.86 -NS.  CTFFR LCx 0.94, OM1 0.90 - NS. (NS=Not Significant).   Diabetes mellitus type II, non insulin dependent (HCC)    Hypertension    Morbid obesity (HCC)    BMI of 38.5 with multiple risk factors.   OSA on CPAP    Pulmonary emboli (HCC) 10/2019   Chest CTA-4 Phadke opacification of main PA but there is partially occlusive main posterior RLL and segmental/segmental branches.  Small thrombus noted in the anterior right middle lobe.  No RV strain.  Scattered aortic atherosclerosis involving great vessels.  Coronary calcification noted.   RLS (restless legs syndrome)     Past Surgical History:  Procedure Laterality Date   BOWEL RESECTION N/A 10/18/2020   Procedure: SMALL BOWEL RESECTION;  Surgeon: Lucretia Roers, MD;  Location: [facility];  Service: General;  Laterality: N/A;   Cataract surgery Right    COLONOSCOPY WITH PROPOFOL N/A 09/27/2020   Procedure: COLONOSCOPY WITH PROPOFOL;  Surgeon: Malissa Hippo, MD;  Location: [facility];  Service: Endoscopy;  Laterality: N/A;  1:15   COLOSTOMY REVERSAL N/A 10/18/2020   Procedure: COLOSTOMY  REVERSAL;  Surgeon: Lucretia Roers, MD;  Location: [facility];  Service: General;  Laterality: N/A;   LAPAROTOMY N/A 06/21/2020   Procedure: EXPLORATORY LAPAROTOMY,  bowel resection, creation ostomy;  Surgeon: Lucretia Roers, MD;  Location: [facility];  Service: General;  Laterality: N/A;   POLYPECTOMY  09/27/2020   Procedure: POLYPECTOMY;  Surgeon: Malissa Hippo, MD;  Location: [facility];  Service: Endoscopy;;   TRANSTHORACIC ECHOCARDIOGRAM  01/31/2023   Normal LV size and function with EF 65 to 70%.  No RWMA.  Unable to assess diastolic function.  Normal RV size and function.  Normal RVP and mildly elevated RAP.  Mild LA dilation and moderate RA dilation.  Moderate AoV calcification with mild AAS (mean AVG 13 mmHg). => Mild AS now present   VENTRAL HERNIA REPAIR N/A 09/02/2022   Procedure: HERNIA REPAIR VENTRAL ADULT WITH  MESH;  Surgeon: Lucretia Roers, MD;  Location: [facility];  Service: General;  Laterality: N/A;    Social History   Socioeconomic History   Marital status: Legally Separated    Spouse name: Not on file   Number of children: 3   Years of education: Not on file  Highest education level: Not on file  Occupational History   Occupation: retired  Tobacco Use   Smoking status: Former    Current packs/day: 0.00    Average packs/day: 2.0 packs/day for 55.0 years (110.0 ttl pk-yrs)    Types: Cigarettes    Start date: 11/18/1946    Quit date: 11/18/2001    Years since quitting: 21.5   Smokeless tobacco: Former  Building services engineer status: Never Used  Substance and Sexual Activity   Alcohol use: Yes    Comment: sometimes   Drug use: Never   Sexual activity: Not Currently  Other Topics Concern   Not on file  Social History Narrative   Not on file   Social Determinants of Health   Financial Resource Strain: Not on file  Food Insecurity: No Food Insecurity (02/01/2023)   Hunger Vital Sign    Worried About Running Out of Food in the Last Year: Never true    Ran  Out of Food in the Last Year: Never true  Transportation Needs: No Transportation Needs (02/01/2023)   PRAPARE - Administrator, Civil Service (Medical): No    Lack of Transportation (Non-Medical): No  Physical Activity: Not on file  Stress: Not on file  Social Connections: Not on file    Family History  Problem Relation Age of Onset   Colon cancer Mother    Emphysema Father        smoked   Diverticulitis Sister    Diabetes Brother    Diabetes Brother    Heart Problems Maternal Grandmother    Breast cancer Daughter    Lupus Son    CAD Neg Hx     Outpatient Encounter Medications as of 06/17/2023  Medication Sig   ACCU-CHEK GUIDE test strip USE TO TEST BLOOD SUGAREONCE DAILY.   Accu-Chek Softclix Lancets lancets daily.   albuterol (VENTOLIN HFA) 108 (90 Base) MCG/ACT inhaler Inhale 1 puff into the lungs every 6 (six) hours as needed for wheezing or shortness of breath.   apixaban (ELIQUIS) 2.5 MG TABS tablet Take 1 tablet (2.5 mg total) by mouth 2 (two) times daily.   Blood Glucose Monitoring Suppl (ACCU-CHEK GUIDE) w/Device KIT USE AS DIRECTEDBh   Budeson-Glycopyrrol-Formoterol (BREZTRI AEROSPHERE) 160-9-4.8 MCG/ACT AERO Inhale 2 puffs into the lungs in the morning and at bedtime.   diclofenac Sodium (VOLTAREN) 1 % GEL Apply 2 g topically as needed.   diltiazem (CARDIZEM CD) 360 MG 24 hr capsule Take 1 capsule (360 mg total) by mouth daily.   hydroxypropyl methylcellulose / hypromellose (ISOPTO TEARS / GONIOVISC) 2.5 % ophthalmic solution Place 1 drop into both eyes as needed for dry eyes.   Mepolizumab (NUCALA) 100 MG/ML SOAJ Inject 1 mL (100 mg total) into the skin every 28 (twenty-eight) days.   metoprolol tartrate (LOPRESSOR) 25 MG tablet Take 1 tablet (25 mg total) by mouth 2 (two) times daily.   nitroGLYCERIN (NITROSTAT) 0.4 MG SL tablet Place 1 tablet (0.4 mg total) under the tongue every 5 (five) minutes as needed for chest pain.   NON FORMULARY Pt uses a cpap  nightly   potassium chloride SA (KLOR-CON M) 20 MEQ tablet Take 20 mEq by mouth daily.   rosuvastatin (CRESTOR) 40 MG tablet TAKE 1 TABLET BY MOUTH ONCE DAILY.   Spacer/Aero-Holding Chambers (AEROCHAMBER MV) inhaler Use as instructed   spironolactone (ALDACTONE) 25 MG tablet Take 0.5 tablets (12.5 mg total) by mouth daily.   tamsulosin (FLOMAX) 0.4 MG CAPS capsule  Take 1 capsule (0.4 mg total) by mouth daily. (Patient taking differently: Take 0.4 mg by mouth at bedtime.)   torsemide (DEMADEX) 20 MG tablet Take 2 tablets (40 mg total) by mouth 2 (two) times daily.   [DISCONTINUED] metFORMIN (GLUCOPHAGE-XR) 500 MG 24 hr tablet Take 500 mg by mouth every morning.   metFORMIN (GLUCOPHAGE-XR) 500 MG 24 hr tablet Take 1 tablet (500 mg total) by mouth every morning.   ondansetron (ZOFRAN-ODT) 4 MG disintegrating tablet Take 1 tablet (4 mg total) by mouth every 8 (eight) hours as needed for nausea or vomiting. (Patient not taking: Reported on 05/28/2023)   No facility-administered encounter medications on file as of 06/17/2023.    ALLERGIES: Allergies  Allergen Reactions   Ancef [Cefazolin] Itching and Rash   Other     Patient reports he was allergic to something in an IV he was given but does not know what the substance was. As of 01/19/2020     VACCINATION STATUS: Immunization History  Administered Date(s) Administered   Influenza Inj Mdck Quad With Preservative 08/28/2018   Influenza,inj,quad, With Preservative 08/29/2015    Diabetes He presents for his follow-up diabetic visit. He has type 2 diabetes mellitus. Onset time: diagnosed at approx age of 66. His disease course has been improving. There are no hypoglycemic associated symptoms. Associated symptoms include fatigue and foot paresthesias. There are no hypoglycemic complications. Diabetic complications include heart disease (CHF, CAD with Afib), nephropathy and peripheral neuropathy. Risk factors for coronary artery disease include  diabetes mellitus, dyslipidemia, family history, obesity, male sex, hypertension and sedentary lifestyle. Current diabetic treatment includes diet and oral agent (monotherapy). He is compliant with treatment most of the time. His weight is decreasing steadily. He is following a generally healthy diet. When asked about meal planning, he reported none. He has not had a previous visit with a dietitian. He rarely participates in exercise. His home blood glucose trend is decreasing steadily. His overall blood glucose range is 130-140 mg/dl. (He presents today with his logs showing at goal glycemic profile.  His POCT A1c today is 5.5%, improving from last visit of 6.8%.  He denies any hypoglycemia.  He did go to the ED between visits for chest pain/indigestion/nausea/bloating and no identifiable cause was determined.) An ACE inhibitor/angiotensin II receptor blocker is not being taken. He does not see a podiatrist.Eye exam is not current.     Review of systems  Constitutional: + steadily decreasing body weight, current Body mass index is 39.07 kg/m., no fatigue, no subjective hyperthermia, no subjective hypothermia Eyes: no blurry vision, no xerophthalmia ENT: no sore throat, no nodules palpated in throat, no dysphagia/odynophagia, no hoarseness Cardiovascular: no chest pain, no shortness of breath, no palpitations, no leg swelling Respiratory: no cough, no shortness of breath Gastrointestinal: no nausea/vomiting/diarrhea Musculoskeletal: no muscle/joint aches Skin: no rashes, no hyperemia Neurological: no tremors, no numbness, no tingling, no dizziness Psychiatric: no depression, no anxiety  Objective:     BP 117/65 (BP Location: Left Arm, Patient Position: Sitting, Cuff Size: Large)   Pulse 76   Ht 5\' 9"  (1.753 m)   Wt 264 lb 9.6 oz (120 kg)   BMI 39.07 kg/m   Wt Readings from Last 3 Encounters:  06/17/23 264 lb 9.6 oz (120 kg)  05/28/23 267 lb (121.1 kg)  05/20/23 278 lb 6.4 oz (126.3 kg)      BP Readings from Last 3 Encounters:  06/17/23 117/65  06/01/23 130/60  05/20/23 (!) 115/98  Physical Exam- Limited  Constitutional:  Body mass index is 39.07 kg/m. , not in acute distress, normal state of mind Eyes:  EOMI, no exophthalmos Musculoskeletal: no gross deformities, strength intact in all four extremities, no gross restriction of joint movements Skin:  no rashes, no hyperemia Neurological: no tremor with outstretched hands    CMP ( most recent) CMP     Component Value Date/Time   NA 136 06/05/2023 1102   NA 134 02/20/2023 0855   K 3.9 06/05/2023 1102   CL 100 06/05/2023 1102   CO2 28 06/05/2023 1102   GLUCOSE 140 (H) 06/05/2023 1102   BUN 25 (H) 06/05/2023 1102   BUN 28 (H) 02/20/2023 0855   CREATININE 1.43 (H) 06/05/2023 1102   CREATININE 1.16 04/12/2022 1618   CALCIUM 9.0 06/05/2023 1102   PROT 6.8 06/05/2023 1102   PROT 6.2 03/20/2020 1151   ALBUMIN 3.7 06/05/2023 1102   ALBUMIN 4.1 03/20/2020 1151   AST 23 06/05/2023 1102   ALT 31 06/05/2023 1102   ALKPHOS 51 06/05/2023 1102   BILITOT 1.6 (H) 06/05/2023 1102   BILITOT 0.4 03/20/2020 1151   GFRNONAA 49 (L) 06/05/2023 1102   GFRAA 39 (L) 06/29/2020 0348     Diabetic Labs (most recent): Lab Results  Component Value Date   HGBA1C 5.5 06/17/2023   HGBA1C 6.8 (H) 01/30/2023   HGBA1C 4.7 (L) 10/16/2020     Lipid Panel ( most recent) Lipid Panel     Component Value Date/Time   CHOL 161 02/20/2023 0855   TRIG 109 02/20/2023 0855   HDL 82 02/20/2023 0855   CHOLHDL 2.0 02/20/2023 0855   LDLCALC 60 02/20/2023 0855   LABVLDL 19 02/20/2023 0855      Lab Results  Component Value Date   TSH 3.583 01/30/2023   TSH 4.978 (H) 10/20/2020   FREET4 1.03 10/21/2020           Assessment & Plan:   1) Type 2 diabetes mellitus with stage 3a chronic kidney disease, without long-term current use of insulin (HCC)  He presents today with his logs showing at goal glycemic profile.  His POCT  A1c today is 5.5%, improving from last visit of 6.8%.  He denies any hypoglycemia.  He did go to the ED between visits for chest pain/indigestion/nausea/bloating and no identifiable cause was determined.  - Vincent Cooper has currently uncontrolled symptomatic type 2 DM since 82 years of age.   -Recent labs reviewed.  - I had a long discussion with him about the progressive nature of diabetes and the pathology behind its complications. -his diabetes is complicated by CKD stage 3, neuropathy and he remains at a high risk for more acute and chronic complications which include CAD, CVA, CKD, retinopathy, and neuropathy. These are all discussed in detail with him.  The following Lifestyle Medicine recommendations according to American College of Lifestyle Medicine [facility]) were discussed and offered to patient and he agrees to start the journey:  A. Whole Foods, Plant-based plate comprising of fruits and vegetables, plant-based proteins, whole-grain carbohydrates was discussed in detail with the patient.   A list for source of those nutrients were also provided to the patient.  Patient will use only water or unsweetened tea for hydration. B.  The need to stay away from risky substances including alcohol, smoking; obtaining 7 to 9 hours of restorative sleep, at least 150 minutes of moderate intensity exercise weekly, the importance of healthy social connections,  and stress reduction techniques were discussed.  C.  A full color page of  Calorie density of various food groups per pound showing examples of each food groups was provided to the patient.  - Nutritional counseling repeated at each appointment due to patients tendency to fall back in to old habits.  - The patient admits there is a room for improvement in their diet and drink choices. -  Suggestion is made for the patient to avoid simple carbohydrates from their diet including Cakes, Sweet Desserts / Pastries, Ice Cream, Soda (diet and regular), Sweet  Tea, Candies, Chips, Cookies, Sweet Pastries, Store Bought Juices, Alcohol in Excess of 1-2 drinks a day, Artificial Sweeteners, Coffee Creamer, and "Sugar-free" Products. This will help patient to have stable blood glucose profile and potentially avoid unintended weight gain.   - I encouraged the patient to switch to unprocessed or minimally processed complex starch and increased protein intake (animal or plant source), fruits, and vegetables.   - Patient is advised to stick to a routine mealtimes to eat 3 meals a day and avoid unnecessary snacks (to snack only to correct hypoglycemia).  - I have approached him with the following individualized plan to manage his diabetes and patient agrees:   -He is advised to continue Metformin 500 mg ER daily after breakfast (to minimize GI upset).  His ultimate goal is to manage with lifestyle modifications and come off medications in the future.  We discussed that if at next visit his glucose is stable, we can discontinue and see how he does.  -he is encouraged to start monitoring glucose 1 times daily, before breakfast, and to call the clinic if he has readings less than 70 or above 300 for 3 tests in a row.  - he is warned not to take insulin without proper monitoring per orders. - Adjustment parameters are given to him for hypo and hyperglycemia in writing. . - he is not a candidate for full dose Metformin due to concurrent renal insufficiency.  - he will be considered for incretin therapy as appropriate next visit.  - Specific targets for  A1c; LDL, HDL, and Triglycerides were discussed with the patient.  2) Blood Pressure /Hypertension:  his blood pressure is controlled to target.   he is advised to continue his current medications including Lopressor 25 mg po twice daily, Demadex 40 mg p.o. twice daily.  3) Lipids/Hyperlipidemia:    Review of his recent lipid panel from 02/20/23 showed controlled LDL at 60 .  he is advised to continue Crestor 40 mg  daily at bedtime.  Side effects and precautions discussed with him.  4)  Weight/Diet:  his Body mass index is 39.07 kg/m.  -  clearly complicating his diabetes care.   he is a candidate for weight loss. I discussed with him the fact that loss of 5 - 10% of his  current body weight will have the most impact on his diabetes management.  Exercise, and detailed carbohydrates information provided  -  detailed on discharge instructions.  5) Chronic Care/Health Maintenance: -he is not on ACEI/ARB and is on Statin medications and is encouraged to initiate and continue to follow up with Ophthalmology, Dentist, Podiatrist at least yearly or according to recommendations, and advised to stay away from smoking. I have recommended yearly flu vaccine and pneumonia vaccine at least every 5 years; moderate intensity exercise for up to 150 minutes weekly; and sleep for at least 7 hours a day.  - he is advised to maintain close follow up with Sasser,  Clarene Critchley, MD for primary care needs, as well as his other providers for optimal and coordinated care.    I spent  30  minutes in the care of the patient today including review of labs from CMP, Lipids, Thyroid Function, Hematology (current and previous including abstractions from other facilities); face-to-face time discussing  his blood glucose readings/logs, discussing hypoglycemia and hyperglycemia episodes and symptoms, medications doses, his options of short and long term treatment based on the latest standards of care / guidelines;  discussion about incorporating lifestyle medicine;  and documenting the encounter. Risk reduction counseling performed per USPSTF guidelines to reduce obesity and cardiovascular risk factors.     Please refer to Patient Instructions for Blood Glucose Monitoring and Insulin/Medications Dosing Guide"  in media tab for additional information. Please  also refer to " Patient Self Inventory" in the Media  tab for reviewed elements of pertinent  patient history.  Vincent Cooper participated in the discussions, expressed understanding, and voiced agreement with the above plans.  All questions were answered to his satisfaction. he is encouraged to contact clinic should he have any questions or concerns prior to his return visit.     Follow up plan: - Return in about 4 months (around 10/18/2023) for Diabetes F/U with A1c in office, No previsit labs.   Ronny Bacon, [facility] [address] [place], [place] [address] Phone: [phone] Fax: [phone]  06/17/2023, 4:46 PM

## 2023-07-03 ENCOUNTER — Other Ambulatory Visit (HOSPITAL_COMMUNITY): Payer: Self-pay

## 2023-07-07 ENCOUNTER — Other Ambulatory Visit (HOSPITAL_COMMUNITY): Payer: Self-pay

## 2023-07-29 ENCOUNTER — Other Ambulatory Visit (HOSPITAL_COMMUNITY): Payer: Self-pay

## 2023-07-30 ENCOUNTER — Ambulatory Visit (INDEPENDENT_AMBULATORY_CARE_PROVIDER_SITE_OTHER): Payer: Medicare HMO | Admitting: Internal Medicine

## 2023-07-30 ENCOUNTER — Encounter: Payer: Self-pay | Admitting: Internal Medicine

## 2023-07-30 VITALS — BP 120/66 | HR 70 | Ht 69.0 in | Wt 275.6 lb

## 2023-07-30 DIAGNOSIS — D7219 Other eosinophilia: Secondary | ICD-10-CM | POA: Diagnosis not present

## 2023-07-30 DIAGNOSIS — J4489 Other specified chronic obstructive pulmonary disease: Secondary | ICD-10-CM | POA: Diagnosis not present

## 2023-07-30 DIAGNOSIS — Z23 Encounter for immunization: Secondary | ICD-10-CM | POA: Diagnosis not present

## 2023-07-30 NOTE — Progress Notes (Signed)
Vincent Cooper    [id number]    03-Feb-1941  Primary Care Physician:Sasser, Clarene Critchley, MD Date of Appointment: 07/30/2023 Established Patient Visit  Chief complaint:   Chief Complaint  Patient presents with   Follow-up    Pt is here for F/U visit. Pt complains of SOB during activities.     HPI: Vincent Cooper is a 82 y.o. 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   Interval Updates: Former patient of Dr. Thora Lance. New to me today.   Previously steroid dependent until he started nucala. None since he started on nucala. No injection site reactions  Current treatment breztri 2 puffs twice daily, albuterol prn, mepolizumab.  OSA on CPAP - [employer]. Cpap machine has a chip but he hasn't brought it with him.   Lives on his own. Able to do all his ADLs, does have to rest in between if gets sob.    I have reviewed the patient's family social and past medical history and updated as appropriate.   Past Medical History:  Diagnosis Date   Chronic gout    COPD (chronic obstructive pulmonary disease) (HCC)    Oxygen dependent   Coronary artery disease due to calcified coronary lesion 03/08/2020   CORONARY CTA: Cor Ca++ Score 1651 !!: ~ 50% LM (CTFFR 1 - NS). Large Dom RCA-<PDA-PAV/PL - diffuse mild-mod plaque: prox (25-49%), mid (50-69%) CTFFR (p 0.99, m 0.85, d 0.81 - NS).  Med-Size LAD - prox-mid long diffuse Mod-Severe Ca++ plaque (~50-69%, ?>70%): CTFFR p 0.95, m 0.88, d 0.86 -NS.  CTFFR LCx 0.94, OM1 0.90 - NS. (NS=Not Significant).   Diabetes mellitus type II, non insulin dependent (HCC)    Hypertension    Morbid obesity (HCC)    BMI of 38.5 with multiple risk factors.   OSA on CPAP    Pulmonary emboli (HCC) 10/2019   Chest CTA-4 Phadke opacification of main PA but there is partially occlusive main posterior RLL and segmental/segmental branches.  Small thrombus noted in the anterior right middle lobe.  No RV strain.  Scattered aortic atherosclerosis  involving great vessels.  Coronary calcification noted.   RLS (restless legs syndrome)     Past Surgical History:  Procedure Laterality Date   BOWEL RESECTION N/A 10/18/2020   Procedure: SMALL BOWEL RESECTION;  Surgeon: Lucretia Roers, MD;  Location: [facility];  Service: General;  Laterality: N/A;   Cataract surgery Right    COLONOSCOPY WITH PROPOFOL N/A 09/27/2020   Procedure: COLONOSCOPY WITH PROPOFOL;  Surgeon: Malissa Hippo, MD;  Location: [facility];  Service: Endoscopy;  Laterality: N/A;  1:15   COLOSTOMY REVERSAL N/A 10/18/2020   Procedure: COLOSTOMY REVERSAL;  Surgeon: Lucretia Roers, MD;  Location: [facility];  Service: General;  Laterality: N/A;   LAPAROTOMY N/A 06/21/2020   Procedure: EXPLORATORY LAPAROTOMY,  bowel resection, creation ostomy;  Surgeon: Lucretia Roers, MD;  Location: [facility];  Service: General;  Laterality: N/A;   POLYPECTOMY  09/27/2020   Procedure: POLYPECTOMY;  Surgeon: Malissa Hippo, MD;  Location: [facility];  Service: Endoscopy;;   TRANSTHORACIC ECHOCARDIOGRAM  01/31/2023   Normal LV size and function with EF 65 to 70%.  No RWMA.  Unable to assess diastolic function.  Normal RV size and function.  Normal RVP and mildly elevated RAP.  Mild LA dilation and moderate RA dilation.  Moderate AoV calcification with mild AAS (mean AVG 13 mmHg). => Mild AS now  present   VENTRAL HERNIA REPAIR N/A 09/02/2022   Procedure: HERNIA REPAIR VENTRAL ADULT WITH  MESH;  Surgeon: Lucretia Roers, MD;  Location: [facility];  Service: General;  Laterality: N/A;    Family History  Problem Relation Age of Onset   Colon cancer Mother    Emphysema Father        smoked   Diverticulitis Sister    Diabetes Brother    Diabetes Brother    Heart Problems Maternal Grandmother    Breast cancer Daughter    Lupus Son    CAD Neg Hx     Social History   Occupational History   Occupation: retired  Tobacco Use   Smoking status: Former    Current packs/day: 0.00     Average packs/day: 2.0 packs/day for 55.0 years (110.0 ttl pk-yrs)    Types: Cigarettes    Start date: 11/18/1946    Quit date: 11/18/2001    Years since quitting: 21.7   Smokeless tobacco: Former  Building services engineer status: Never Used  Substance and Sexual Activity   Alcohol use: Yes    Comment: sometimes   Drug use: Never   Sexual activity: Not Currently     Physical Exam: Blood pressure 120/66, pulse 70, height 5\' 9"  (1.753 m), weight 275 lb 9.6 oz (125 kg), SpO2 96%.  Gen:      No acute distress, obese ENT:  no nasal polyps, mucus membranes moist Lungs:    diminished air entry no wheezes or crackles CV:         Regular rate and rhythm; no murmurs, rubs, or gallops.  No pedal edema Abd: obese, reducible ventral hernia  Data Reviewed: Imaging: I have personally reviewed the chest xray June 2024 - shows cardiomegaly  PFTs:     Latest Ref Rng & Units 06/07/2022   12:49 PM  PFT Results  FVC-Pre L 2.61   FVC-Predicted Pre % 77   FVC-Post L 2.60   FVC-Predicted Post % 76   Pre FEV1/FVC % % 67   Post FEV1/FCV % % 67   FEV1-Pre L 1.75   FEV1-Predicted Pre % 73   FEV1-Post L 1.73   DLCO uncorrected ml/min/mmHg 16.34   DLCO UNC% % 76   DLCO corrected ml/min/mmHg 16.34   DLCO COR %Predicted % 76   DLVA Predicted % 87   TLC L 5.60   TLC % Predicted % 89   RV % Predicted % 116    I have personally reviewed the patient's PFTs and mild airflow limitation   Labs:  Immunization status: Immunization History  Administered Date(s) Administered   Fluad Trivalent(High Dose 65+) 07/30/2023   Influenza Inj Mdck Quad With Preservative 08/28/2018   Influenza,inj,quad, With Preservative 08/29/2015    External Records Personally Reviewed: pulmonary  Assessment:  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  Continue nucala injections. His A1C has come down since last year and he hasn't needed prednisone!  Call me if you have any problems with breathing that are worsening, or you require prednisone, I'll see you sooner.   Quit smoking over 20 years ago. Outside the window for ldct for lung cancer screening  High dose flu shot today  He will bring chip from cpap  machine to next visit   Return to Care: Return in about 6 months (around 01/27/2024).   Durel Salts, MD Pulmonary and Critical Care Medicine [facility] [phone]

## 2023-07-30 NOTE — Patient Instructions (Signed)
It was a pleasure to see you today!  Please schedule follow up scheduled with myself in 6 months.  If my schedule is not open yet, we will contact you with a reminder closer to that time. Please call [phone] if you haven't heard from [place] a month before, and always call us sooner if issues or concerns arise. You can also send [place] a message through MyChart, but but aware that this is not to be used for urgent issues and it may take up to 5-7 days to receive a reply. Please be aware that you will likely be able to view your results before I have a chance to respond to them. Please give [place] 5 business days to respond to any non-urgent results.  It was a pleasure to see you today!  Before your next visit I would like you to have:  Continue the breztri 2 puffs twice daily, gargle after use Use the albuterol inhaler as needed prior to exertion and if you get short of breath, wheezing, chest tightness.  Continue nucala injections.   Call me if you have any problems with breathing that are worsening, or you require prednisone, I'll see you sooner.

## 2023-07-31 ENCOUNTER — Other Ambulatory Visit (HOSPITAL_COMMUNITY): Payer: Self-pay

## 2023-08-21 ENCOUNTER — Other Ambulatory Visit: Payer: Self-pay

## 2023-08-21 NOTE — Progress Notes (Signed)
Specialty Pharmacy Refill Coordination Note  Vincent Cooper is a 82 y.o. male contacted today regarding refills of specialty medication(s) Mepolizumab   Patient requested Delivery   Delivery date: 08/29/23   Verified address: [address] [place] [address]   Medication will be filled on 08/28/23.

## 2023-09-01 ENCOUNTER — Other Ambulatory Visit: Payer: Self-pay

## 2023-09-17 ENCOUNTER — Other Ambulatory Visit: Payer: Self-pay

## 2023-09-19 ENCOUNTER — Other Ambulatory Visit: Payer: Self-pay

## 2023-09-19 ENCOUNTER — Other Ambulatory Visit: Payer: Self-pay | Admitting: Student

## 2023-09-19 DIAGNOSIS — J4489 Other specified chronic obstructive pulmonary disease: Secondary | ICD-10-CM

## 2023-09-19 NOTE — Progress Notes (Signed)
Specialty Pharmacy Ongoing Clinical Assessment Note  Vincent Cooper is a 82 y.o. male who is being followed by the specialty pharmacy service for RxSp Asthma/COPD   Patient's specialty medication(s) reviewed today: Mepolizumab   Missed doses in the last 4 weeks: 0   Patient/Caregiver did not have any additional questions or concerns.   Therapeutic benefit summary: Patient is achieving benefit   Adverse events/side effects summary: No adverse events/side effects   Patient's therapy is appropriate to: Continue    Goals Addressed             This Visit's Progress    Reduce disease symptoms including coughing and shortness of breath       Patient is on track. Patient will maintain adherence         Follow up:  6 months  Otto Herb Specialty Pharmacist

## 2023-09-19 NOTE — Progress Notes (Signed)
Specialty Pharmacy Refill Coordination Note  Vincent Cooper is a 82 y.o. male contacted today regarding refills of specialty medication(s) Mepolizumab   Patient requested Delivery   Delivery date: 09/24/23   Verified address: [address] [place] [address]   Medication will be filled on 09/23/23.

## 2023-09-22 ENCOUNTER — Other Ambulatory Visit: Payer: Self-pay

## 2023-09-22 ENCOUNTER — Other Ambulatory Visit (HOSPITAL_COMMUNITY): Payer: Self-pay

## 2023-09-22 MED ORDER — NUCALA 100 MG/ML ~~LOC~~ SOAJ
100.0000 mg | SUBCUTANEOUS | 1 refills | Status: DC
Start: 2023-09-22 — End: 2024-03-02
  Filled 2023-09-22: qty 1, 28d supply, fill #0
  Filled 2023-10-20: qty 1, 28d supply, fill #1
  Filled 2023-11-17: qty 1, 28d supply, fill #2
  Filled 2023-12-10: qty 1, 28d supply, fill #3
  Filled 2024-01-07: qty 1, 28d supply, fill #4
  Filled 2024-02-03: qty 1, 28d supply, fill #5

## 2023-09-22 NOTE — Telephone Encounter (Signed)
Refill sent for [facility] to [facility]: [phone]   Dose: 100mg  SQ every 4 weeks  Last OV: 07/30/23 Provider: Dr. Celine Mans  Next OV: 6 months (not yet scheduled)  Vincent Cooper, PharmD, MPH, BCPS Clinical Pharmacist (Rheumatology and Pulmonology)

## 2023-09-23 ENCOUNTER — Other Ambulatory Visit: Payer: Self-pay

## 2023-09-24 ENCOUNTER — Other Ambulatory Visit: Payer: Self-pay

## 2023-10-14 ENCOUNTER — Other Ambulatory Visit: Payer: Self-pay

## 2023-10-20 ENCOUNTER — Ambulatory Visit (INDEPENDENT_AMBULATORY_CARE_PROVIDER_SITE_OTHER): Payer: Medicare HMO | Admitting: Nurse Practitioner

## 2023-10-20 ENCOUNTER — Other Ambulatory Visit: Payer: Self-pay

## 2023-10-20 ENCOUNTER — Encounter: Payer: Self-pay | Admitting: Nurse Practitioner

## 2023-10-20 VITALS — BP 124/79 | HR 65 | Ht 69.0 in | Wt 271.8 lb

## 2023-10-20 DIAGNOSIS — I10 Essential (primary) hypertension: Secondary | ICD-10-CM | POA: Diagnosis not present

## 2023-10-20 DIAGNOSIS — E1122 Type 2 diabetes mellitus with diabetic chronic kidney disease: Secondary | ICD-10-CM

## 2023-10-20 DIAGNOSIS — Z7984 Long term (current) use of oral hypoglycemic drugs: Secondary | ICD-10-CM

## 2023-10-20 DIAGNOSIS — N1831 Chronic kidney disease, stage 3a: Secondary | ICD-10-CM

## 2023-10-20 DIAGNOSIS — E782 Mixed hyperlipidemia: Secondary | ICD-10-CM

## 2023-10-20 LAB — POCT UA - MICROALBUMIN
Albumin/Creatinine Ratio, Urine, POC: <30
Creatinine, POC: 300 mg/dL

## 2023-10-20 LAB — POCT GLYCOSYLATED HEMOGLOBIN (HGB A1C): Hemoglobin A1C: 6.2 % — AB (ref 4.0–5.6)

## 2023-10-20 MED ORDER — METFORMIN HCL ER 500 MG PO TB24: 500.0000 mg | ORAL_TABLET | Freq: Every morning | ORAL | 1 refills | Status: DC

## 2023-10-20 NOTE — Progress Notes (Signed)
Specialty Pharmacy Refill Coordination Note  Vincent Cooper is a 82 y.o. male contacted today regarding refills of specialty medication(s) Mepolizumab   Patient requested Delivery   Delivery date: 10/24/23   Verified address: [address] [place] [address]   Medication will be filled on 10/23/23.

## 2023-10-20 NOTE — Progress Notes (Signed)
Endocrinology Follow Up Note       10/20/2023, 4:11 PM   Subjective:    Patient ID: Vincent Cooper, male    DOB: 1941-04-13.  Vincent Cooper is being seen in follow up after being seen in consultation for management of currently uncontrolled symptomatic diabetes requested by  Estanislado Pandy, MD.   Past Medical History:  Diagnosis Date   Chronic gout    COPD (chronic obstructive pulmonary disease) (HCC)    Oxygen dependent   Coronary artery disease due to calcified coronary lesion 03/08/2020   CORONARY CTA: Cor Ca++ Score 1651 !!: ~ 50% LM (CTFFR 1 - NS). Large Dom RCA-<PDA-PAV/PL - diffuse mild-mod plaque: prox (25-49%), mid (50-69%) CTFFR (p 0.99, m 0.85, d 0.81 - NS).  Med-Size LAD - prox-mid long diffuse Mod-Severe Ca++ plaque (~50-69%, ?>70%): CTFFR p 0.95, m 0.88, d 0.86 -NS.  CTFFR LCx 0.94, OM1 0.90 - NS. (NS=Not Significant).   Diabetes mellitus type II, non insulin dependent (HCC)    Hypertension    Morbid obesity (HCC)    BMI of 38.5 with multiple risk factors.   OSA on CPAP    Pulmonary emboli (HCC) 10/2019   Chest CTA-4 Phadke opacification of main PA but there is partially occlusive main posterior RLL and segmental/segmental branches.  Small thrombus noted in the anterior right middle lobe.  No RV strain.  Scattered aortic atherosclerosis involving great vessels.  Coronary calcification noted.   RLS (restless legs syndrome)     Past Surgical History:  Procedure Laterality Date   BOWEL RESECTION N/A 10/18/2020   Procedure: SMALL BOWEL RESECTION;  Surgeon: Lucretia Roers, MD;  Location: [facility];  Service: General;  Laterality: N/A;   Cataract surgery Right    COLONOSCOPY WITH PROPOFOL N/A 09/27/2020   Procedure: COLONOSCOPY WITH PROPOFOL;  Surgeon: Malissa Hippo, MD;  Location: [facility];  Service: Endoscopy;  Laterality: N/A;  1:15   COLOSTOMY REVERSAL N/A 10/18/2020   Procedure: COLOSTOMY  REVERSAL;  Surgeon: Lucretia Roers, MD;  Location: [facility];  Service: General;  Laterality: N/A;   LAPAROTOMY N/A 06/21/2020   Procedure: EXPLORATORY LAPAROTOMY,  bowel resection, creation ostomy;  Surgeon: Lucretia Roers, MD;  Location: [facility];  Service: General;  Laterality: N/A;   POLYPECTOMY  09/27/2020   Procedure: POLYPECTOMY;  Surgeon: Malissa Hippo, MD;  Location: [facility];  Service: Endoscopy;;   TRANSTHORACIC ECHOCARDIOGRAM  01/31/2023   Normal LV size and function with EF 65 to 70%.  No RWMA.  Unable to assess diastolic function.  Normal RV size and function.  Normal RVP and mildly elevated RAP.  Mild LA dilation and moderate RA dilation.  Moderate AoV calcification with mild AAS (mean AVG 13 mmHg). => Mild AS now present   VENTRAL HERNIA REPAIR N/A 09/02/2022   Procedure: HERNIA REPAIR VENTRAL ADULT WITH  MESH;  Surgeon: Lucretia Roers, MD;  Location: [facility];  Service: General;  Laterality: N/A;    Social History   Socioeconomic History   Marital status: Legally Separated    Spouse name: Not on file   Number of children: 3   Years of education: Not on file  Highest education level: Not on file  Occupational History   Occupation: retired  Tobacco Use   Smoking status: Former    Current packs/day: 0.00    Average packs/day: 2.0 packs/day for 55.0 years (110.0 ttl pk-yrs)    Types: Cigarettes    Start date: 11/18/1946    Quit date: 11/18/2001    Years since quitting: 21.9   Smokeless tobacco: Former  Building services engineer status: Never Used  Substance and Sexual Activity   Alcohol use: Yes    Comment: sometimes   Drug use: Never   Sexual activity: Not Currently  Other Topics Concern   Not on file  Social History Narrative   Not on file   Social Determinants of Health   Financial Resource Strain: Not on file  Food Insecurity: No Food Insecurity (02/01/2023)   Hunger Vital Sign    Worried About Running Out of Food in the Last Year: Never true    Ran  Out of Food in the Last Year: Never true  Transportation Needs: No Transportation Needs (02/01/2023)   PRAPARE - Administrator, Civil Service (Medical): No    Lack of Transportation (Non-Medical): No  Physical Activity: Not on file  Stress: Not on file  Social Connections: Not on file    Family History  Problem Relation Age of Onset   Colon cancer Mother    Emphysema Father        smoked   Diverticulitis Sister    Diabetes Brother    Diabetes Brother    Heart Problems Maternal Grandmother    Breast cancer Daughter    Lupus Son    CAD Neg Hx     Outpatient Encounter Medications as of 10/20/2023  Medication Sig   ACCU-CHEK GUIDE test strip USE TO TEST BLOOD SUGAREONCE DAILY.   Accu-Chek Softclix Lancets lancets daily.   albuterol (VENTOLIN HFA) 108 (90 Base) MCG/ACT inhaler Inhale 1 puff into the lungs every 6 (six) hours as needed for wheezing or shortness of breath.   apixaban (ELIQUIS) 2.5 MG TABS tablet Take 1 tablet (2.5 mg total) by mouth 2 (two) times daily.   Blood Glucose Monitoring Suppl (ACCU-CHEK GUIDE) w/Device KIT USE AS DIRECTEDBh   Budeson-Glycopyrrol-Formoterol (BREZTRI AEROSPHERE) 160-9-4.8 MCG/ACT AERO Inhale 2 puffs into the lungs in the morning and at bedtime.   diclofenac Sodium (VOLTAREN) 1 % GEL Apply 2 g topically as needed.   diltiazem (CARDIZEM CD) 360 MG 24 hr capsule Take 1 capsule (360 mg total) by mouth daily.   hydroxypropyl methylcellulose / hypromellose (ISOPTO TEARS / GONIOVISC) 2.5 % ophthalmic solution Place 1 drop into both eyes as needed for dry eyes.   Mepolizumab (NUCALA) 100 MG/ML SOAJ Inject 1 mL (100 mg total) into the skin every 28 (twenty-eight) days.   metoprolol tartrate (LOPRESSOR) 25 MG tablet Take 1 tablet (25 mg total) by mouth 2 (two) times daily.   nitroGLYCERIN (NITROSTAT) 0.4 MG SL tablet Place 1 tablet (0.4 mg total) under the tongue every 5 (five) minutes as needed for chest pain.   NON FORMULARY Pt uses a cpap  nightly   ondansetron (ZOFRAN-ODT) 4 MG disintegrating tablet Take 1 tablet (4 mg total) by mouth every 8 (eight) hours as needed for nausea or vomiting.   potassium chloride SA (KLOR-CON M) 20 MEQ tablet Take 20 mEq by mouth daily.   rosuvastatin (CRESTOR) 40 MG tablet TAKE 1 TABLET BY MOUTH ONCE DAILY.   Spacer/Aero-Holding Chambers (AEROCHAMBER MV) inhaler Use  as instructed   spironolactone (ALDACTONE) 25 MG tablet Take 0.5 tablets (12.5 mg total) by mouth daily.   tamsulosin (FLOMAX) 0.4 MG CAPS capsule Take 1 capsule (0.4 mg total) by mouth daily. (Patient taking differently: Take 0.4 mg by mouth at bedtime.)   torsemide (DEMADEX) 20 MG tablet Take 2 tablets (40 mg total) by mouth 2 (two) times daily.   [DISCONTINUED] metFORMIN (GLUCOPHAGE-XR) 500 MG 24 hr tablet Take 1 tablet (500 mg total) by mouth every morning.   metFORMIN (GLUCOPHAGE-XR) 500 MG 24 hr tablet Take 1 tablet (500 mg total) by mouth every morning.   No facility-administered encounter medications on file as of 10/20/2023.    ALLERGIES: Allergies  Allergen Reactions   Ancef [Cefazolin] Itching and Rash   Other     Patient reports he was allergic to something in an IV he was given but does not know what the substance was. As of 01/19/2020     VACCINATION STATUS: Immunization History  Administered Date(s) Administered   Fluad Trivalent(High Dose 65+) 07/30/2023   Influenza Inj Mdck Quad With Preservative 08/28/2018   Influenza,inj,quad, With Preservative 08/29/2015    Diabetes He presents for his follow-up diabetic visit. He has type 2 diabetes mellitus. Onset time: diagnosed at approx age of 73. His disease course has been stable. There are no hypoglycemic associated symptoms. Associated symptoms include fatigue and foot paresthesias. There are no hypoglycemic complications. Diabetic complications include heart disease (CHF, CAD with Afib), nephropathy and peripheral neuropathy. Risk factors for coronary artery disease  include diabetes mellitus, dyslipidemia, family history, obesity, male sex, hypertension and sedentary lifestyle. Current diabetic treatment includes diet and oral agent (monotherapy). He is compliant with treatment most of the time. His weight is fluctuating minimally. He is following a generally healthy diet. When asked about meal planning, he reported none. He has not had a previous visit with a dietitian. He rarely participates in exercise. His home blood glucose trend is decreasing steadily. His breakfast blood glucose range is generally 140-180 mg/dl. (He presents today with his logs showing at goal glycemic profile overall.  His POCT A1c today is 6.2%, increasing slightly from last visit of 5.5%.  He denies any hypoglycemia.) An ACE inhibitor/angiotensin II receptor blocker is not being taken. He does not see a podiatrist.Eye exam is not current.     Review of systems  Constitutional: + Minimally fluctuating body weight,  current Body mass index is 40.14 kg/m. , no fatigue, no subjective hyperthermia, no subjective hypothermia Eyes: no blurry vision, no xerophthalmia ENT: no sore throat, no nodules palpated in throat, no dysphagia/odynophagia, no hoarseness Cardiovascular: no chest pain, no shortness of breath, no palpitations, no leg swelling Respiratory: no cough, no shortness of breath Gastrointestinal: no nausea/vomiting/diarrhea Musculoskeletal: no muscle/joint aches Skin: no rashes, no hyperemia Neurological: no tremors, no numbness, no tingling, no dizziness Psychiatric: no depression, no anxiety  Objective:     BP 124/79 (BP Location: Left Arm, Patient Position: Sitting, Cuff Size: Large)   Pulse 65   Ht 5\' 9"  (1.753 m)   Wt 271 lb 12.8 oz (123.3 kg)   BMI 40.14 kg/m   Wt Readings from Last 3 Encounters:  10/20/23 271 lb 12.8 oz (123.3 kg)  07/30/23 275 lb 9.6 oz (125 kg)  06/17/23 264 lb 9.6 oz (120 kg)     BP Readings from Last 3 Encounters:  10/20/23 124/79   07/30/23 120/66  06/17/23 117/65     Physical Exam- Limited  Constitutional:  Body mass  index is 40.14 kg/m. , not in acute distress, normal state of mind Eyes:  EOMI, no exophthalmos Musculoskeletal: no gross deformities, strength intact in all four extremities, no gross restriction of joint movements Skin:  no rashes, no hyperemia Neurological: no tremor with outstretched hands  Diabetic Foot Exam - Simple   Simple Foot Form Diabetic Foot exam was performed with the following findings: Yes 10/20/2023  4:10 PM  Visual Inspection See comments: Yes Sensation Testing Intact to touch and monofilament testing bilaterally: Yes Pulse Check Posterior Tibialis and Dorsalis pulse intact bilaterally: Yes Comments Dry flaky skin bilaterally, mild onychomycosis bilaterally      CMP ( most recent) CMP     Component Value Date/Time   NA 136 06/05/2023 1102   NA 134 02/20/2023 0855   K 3.9 06/05/2023 1102   CL 100 06/05/2023 1102   CO2 28 06/05/2023 1102   GLUCOSE 140 (H) 06/05/2023 1102   BUN 25 (H) 06/05/2023 1102   BUN 28 (H) 02/20/2023 0855   CREATININE 1.43 (H) 06/05/2023 1102   CREATININE 1.16 04/12/2022 1618   CALCIUM 9.0 06/05/2023 1102   PROT 6.8 06/05/2023 1102   PROT 6.2 03/20/2020 1151   ALBUMIN 3.7 06/05/2023 1102   ALBUMIN 4.1 03/20/2020 1151   AST 23 06/05/2023 1102   ALT 31 06/05/2023 1102   ALKPHOS 51 06/05/2023 1102   BILITOT 1.6 (H) 06/05/2023 1102   BILITOT 0.4 03/20/2020 1151   GFRNONAA 49 (L) 06/05/2023 1102   GFRAA 39 (L) 06/29/2020 0348     Diabetic Labs (most recent): Lab Results  Component Value Date   HGBA1C 6.2 (A) 10/20/2023   HGBA1C 5.5 06/17/2023   HGBA1C 6.8 (H) 01/30/2023   MICROALBUR 30mg /L 10/20/2023     Lipid Panel ( most recent) Lipid Panel     Component Value Date/Time   CHOL 161 02/20/2023 0855   TRIG 109 02/20/2023 0855   HDL 82 02/20/2023 0855   CHOLHDL 2.0 02/20/2023 0855   LDLCALC 60 02/20/2023 0855   LABVLDL 19  02/20/2023 0855      Lab Results  Component Value Date   TSH 3.583 01/30/2023   TSH 4.978 (H) 10/20/2020   FREET4 1.03 10/21/2020           Assessment & Plan:   1) Type 2 diabetes mellitus with stage 3a chronic kidney disease, without long-term current use of insulin (HCC)  He presents today with his logs showing at goal glycemic profile overall.  His POCT A1c today is 6.2%, increasing slightly from last visit of 5.5%.  He denies any hypoglycemia.  - Zacharay Rafter has currently uncontrolled symptomatic type 2 DM since 82 years of age.   -Recent labs reviewed.  - I had a long discussion with him about the progressive nature of diabetes and the pathology behind its complications. -his diabetes is complicated by CKD stage 3, neuropathy and he remains at a high risk for more acute and chronic complications which include CAD, CVA, CKD, retinopathy, and neuropathy. These are all discussed in detail with him.  The following Lifestyle Medicine recommendations according to American College of Lifestyle Medicine [facility]) were discussed and offered to patient and he agrees to start the journey:  A. Whole Foods, Plant-based plate comprising of fruits and vegetables, plant-based proteins, whole-grain carbohydrates was discussed in detail with the patient.   A list for source of those nutrients were also provided to the patient.  Patient will use only water or unsweetened tea for hydration. B.  The need to stay away from risky substances including alcohol, smoking; obtaining 7 to 9 hours of restorative sleep, at least 150 minutes of moderate intensity exercise weekly, the importance of healthy social connections,  and stress reduction techniques were discussed. C.  A full color page of  Calorie density of various food groups per pound showing examples of each food groups was provided to the patient.  - Nutritional counseling repeated at each appointment due to patients tendency to fall back in to old  habits.  - The patient admits there is a room for improvement in their diet and drink choices. -  Suggestion is made for the patient to avoid simple carbohydrates from their diet including Cakes, Sweet Desserts / Pastries, Ice Cream, Soda (diet and regular), Sweet Tea, Candies, Chips, Cookies, Sweet Pastries, Store Bought Juices, Alcohol in Excess of 1-2 drinks a day, Artificial Sweeteners, Coffee Creamer, and "Sugar-free" Products. This will help patient to have stable blood glucose profile and potentially avoid unintended weight gain.   - I encouraged the patient to switch to unprocessed or minimally processed complex starch and increased protein intake (animal or plant source), fruits, and vegetables.   - Patient is advised to stick to a routine mealtimes to eat 3 meals a day and avoid unnecessary snacks (to snack only to correct hypoglycemia).  - I have approached him with the following individualized plan to manage his diabetes and patient agrees:   -He is advised to continue Metformin 500 mg ER daily after breakfast (to minimize GI upset).  His ultimate goal is to manage with lifestyle modifications and come off medications in the future.  We discussed that if at next visit his glucose is stable, we can discontinue and see how he does.  Will try to get through holiday season first.  -he is encouraged to start monitoring glucose 1 times daily, before breakfast, and to call the clinic if he has readings less than 70 or above 300 for 3 tests in a row.  - he is warned not to take insulin without proper monitoring per orders. - Adjustment parameters are given to him for hypo and hyperglycemia in writing. . - he is not a candidate for full dose Metformin due to concurrent renal insufficiency.  - he will be considered for incretin therapy as appropriate next visit.  - Specific targets for  A1c; LDL, HDL, and Triglycerides were discussed with the patient.  2) Blood Pressure /Hypertension:  his  blood pressure is controlled to target.   he is advised to continue his current medications including Lopressor 25 mg po twice daily, Demadex 40 mg p.o. twice daily.  3) Lipids/Hyperlipidemia:    Review of his recent lipid panel from 02/20/23 showed controlled LDL at 60 .  he is advised to continue Crestor 40 mg daily at bedtime.  Side effects and precautions discussed with him.  4)  Weight/Diet:  his Body mass index is 40.14 kg/m.  -  clearly complicating his diabetes care.   he is a candidate for weight loss. I discussed with him the fact that loss of 5 - 10% of his  current body weight will have the most impact on his diabetes management.  Exercise, and detailed carbohydrates information provided  -  detailed on discharge instructions.  5) Chronic Care/Health Maintenance: -he is not on ACEI/ARB and is on Statin medications and is encouraged to initiate and continue to follow up with Ophthalmology, Dentist, Podiatrist at least yearly or according to recommendations, and  advised to stay away from smoking. I have recommended yearly flu vaccine and pneumonia vaccine at least every 5 years; moderate intensity exercise for up to 150 minutes weekly; and sleep for at least 7 hours a day.  - he is advised to maintain close follow up with Sasser, Clarene Critchley, MD for primary care needs, as well as his other providers for optimal and coordinated care.     I spent  34  minutes in the care of the patient today including review of labs from CMP, Lipids, Thyroid Function, Hematology (current and previous including abstractions from other facilities); face-to-face time discussing  his blood glucose readings/logs, discussing hypoglycemia and hyperglycemia episodes and symptoms, medications doses, his options of short and long term treatment based on the latest standards of care / guidelines;  discussion about incorporating lifestyle medicine;  and documenting the encounter. Risk reduction counseling performed per USPSTF  guidelines to reduce obesity and cardiovascular risk factors.     Please refer to Patient Instructions for Blood Glucose Monitoring and Insulin/Medications Dosing Guide"  in media tab for additional information. Please  also refer to " Patient Self Inventory" in the Media  tab for reviewed elements of pertinent patient history.  Vida Holtry participated in the discussions, expressed understanding, and voiced agreement with the above plans.  All questions were answered to his satisfaction. he is encouraged to contact clinic should he have any questions or concerns prior to his return visit.     Follow up plan: - Return in about 4 months (around 02/18/2024) for Diabetes F/U with A1c in office, No previsit labs.   Ronny Bacon, [facility] [address] [place], [place] [address] Phone: [phone] Fax: [phone]  10/20/2023, 4:11 PM

## 2023-10-23 ENCOUNTER — Other Ambulatory Visit: Payer: Self-pay

## 2023-11-11 ENCOUNTER — Other Ambulatory Visit: Payer: Self-pay

## 2023-11-14 ENCOUNTER — Other Ambulatory Visit: Payer: Self-pay

## 2023-11-17 ENCOUNTER — Other Ambulatory Visit (HOSPITAL_COMMUNITY): Payer: Self-pay | Admitting: Pharmacy Technician

## 2023-11-17 ENCOUNTER — Other Ambulatory Visit (HOSPITAL_COMMUNITY): Payer: Self-pay

## 2023-11-17 NOTE — Progress Notes (Signed)
Specialty Pharmacy Refill Coordination Note  Vincent Cooper is a 82 y.o. male contacted today regarding refills of specialty medication(s) Mepolizumab [name]   Patient requested Delivery   Delivery date: 11/21/23   Verified address: [address]  [place]   Medication will be filled on 11/20/23.

## 2023-11-20 ENCOUNTER — Other Ambulatory Visit: Payer: Self-pay

## 2023-12-09 ENCOUNTER — Other Ambulatory Visit: Payer: Self-pay | Admitting: Cardiology

## 2023-12-10 ENCOUNTER — Other Ambulatory Visit: Payer: Self-pay

## 2023-12-10 NOTE — Progress Notes (Signed)
Specialty Pharmacy Refill Coordination Note  Vincent Cooper is a 83 y.o. male contacted today regarding refills of specialty medication(s) Mepolizumab [name]   Patient requested Delivery   Delivery date: 12/17/23   Verified address: [address]  [place]   Medication will be filled on 12/16/23.

## 2023-12-16 ENCOUNTER — Other Ambulatory Visit: Payer: Self-pay

## 2023-12-26 DIAGNOSIS — I5033 Acute on chronic diastolic (congestive) heart failure: Secondary | ICD-10-CM | POA: Diagnosis not present

## 2023-12-26 DIAGNOSIS — I4891 Unspecified atrial fibrillation: Secondary | ICD-10-CM | POA: Diagnosis not present

## 2023-12-26 DIAGNOSIS — E7849 Other hyperlipidemia: Secondary | ICD-10-CM | POA: Diagnosis not present

## 2023-12-26 DIAGNOSIS — E782 Mixed hyperlipidemia: Secondary | ICD-10-CM | POA: Diagnosis not present

## 2023-12-26 DIAGNOSIS — J449 Chronic obstructive pulmonary disease, unspecified: Secondary | ICD-10-CM | POA: Diagnosis not present

## 2023-12-26 DIAGNOSIS — Z6839 Body mass index (BMI) 39.0-39.9, adult: Secondary | ICD-10-CM | POA: Diagnosis not present

## 2023-12-26 DIAGNOSIS — Z0001 Encounter for general adult medical examination with abnormal findings: Secondary | ICD-10-CM | POA: Diagnosis not present

## 2023-12-26 DIAGNOSIS — E119 Type 2 diabetes mellitus without complications: Secondary | ICD-10-CM | POA: Diagnosis not present

## 2023-12-26 DIAGNOSIS — N1831 Chronic kidney disease, stage 3a: Secondary | ICD-10-CM | POA: Diagnosis not present

## 2024-01-07 ENCOUNTER — Other Ambulatory Visit: Payer: Self-pay

## 2024-01-07 ENCOUNTER — Other Ambulatory Visit: Payer: Self-pay | Admitting: Pharmacy Technician

## 2024-01-07 NOTE — Progress Notes (Signed)
 Specialty Pharmacy Refill Coordination Note  Vincent Cooper is a 83 y.o. male contacted today regarding refills of specialty medication(s) Mepolizumab [name]  Spoke with Daughter  Patient requested Delivery   Delivery date: 01/14/24   Verified address: [address]  [place]   Medication will be filled on 01/13/24.

## 2024-01-11 ENCOUNTER — Emergency Department (HOSPITAL_COMMUNITY): Payer: Medicare HMO

## 2024-01-11 ENCOUNTER — Encounter (HOSPITAL_COMMUNITY): Payer: Self-pay

## 2024-01-11 ENCOUNTER — Emergency Department (HOSPITAL_COMMUNITY)
Admission: EM | Admit: 2024-01-11 | Discharge: 2024-01-11 | Disposition: A | Discharge status: Home / Self Care | Payer: Medicare HMO | Attending: Emergency Medicine | Admitting: Emergency Medicine

## 2024-01-11 ENCOUNTER — Other Ambulatory Visit: Payer: Self-pay

## 2024-01-11 DIAGNOSIS — Z7901 Long term (current) use of anticoagulants: Secondary | ICD-10-CM | POA: Diagnosis not present

## 2024-01-11 DIAGNOSIS — M7989 Other specified soft tissue disorders: Secondary | ICD-10-CM | POA: Insufficient documentation

## 2024-01-11 DIAGNOSIS — J90 Pleural effusion, not elsewhere classified: Secondary | ICD-10-CM | POA: Diagnosis not present

## 2024-01-11 DIAGNOSIS — I5020 Unspecified systolic (congestive) heart failure: Secondary | ICD-10-CM | POA: Diagnosis not present

## 2024-01-11 DIAGNOSIS — J9811 Atelectasis: Secondary | ICD-10-CM | POA: Diagnosis not present

## 2024-01-11 DIAGNOSIS — R0602 Shortness of breath: Secondary | ICD-10-CM | POA: Diagnosis not present

## 2024-01-11 DIAGNOSIS — I110 Hypertensive heart disease with heart failure: Secondary | ICD-10-CM | POA: Diagnosis not present

## 2024-01-11 DIAGNOSIS — I517 Cardiomegaly: Secondary | ICD-10-CM | POA: Diagnosis not present

## 2024-01-11 DIAGNOSIS — I5022 Chronic systolic (congestive) heart failure: Secondary | ICD-10-CM | POA: Diagnosis not present

## 2024-01-11 HISTORY — DX: Heart failure, unspecified: I50.9

## 2024-01-11 LAB — COMPREHENSIVE METABOLIC PANEL
ALT: 29 U/L (ref 0–44)
AST: 26 U/L (ref 15–41)
Albumin: 3.5 g/dL (ref 3.5–5.0)
Alkaline Phosphatase: 48 U/L (ref 38–126)
Anion gap: 12 (ref 5–15)
BUN: 23 mg/dL (ref 8–23)
CO2: 24 mmol/L (ref 22–32)
Calcium: 8.5 mg/dL — ABNORMAL LOW (ref 8.9–10.3)
Chloride: 102 mmol/L (ref 98–111)
Creatinine, Ser: 1.2 mg/dL (ref 0.61–1.24)
GFR, Estimated: >60 mL/min (ref >60)
Glucose, Bld: 158 mg/dL — ABNORMAL HIGH (ref 70–99)
Potassium: 4 mmol/L (ref 3.5–5.1)
Sodium: 138 mmol/L (ref 135–145)
Total Bilirubin: 1.6 mg/dL — ABNORMAL HIGH (ref 0.0–1.2)
Total Protein: 6.4 g/dL — ABNORMAL LOW (ref 6.5–8.1)

## 2024-01-11 LAB — CBC WITH DIFFERENTIAL/PLATELET
Abs Immature Granulocytes: 0.04 10*3/uL (ref 0.00–0.07)
Basophils Absolute: 0 10*3/uL (ref 0.0–0.1)
Basophils Relative: 0 %
Eosinophils Absolute: 0 10*3/uL (ref 0.0–0.5)
Eosinophils Relative: 0 %
HCT: 40.1 % (ref 39.0–52.0)
Hemoglobin: 13.6 g/dL (ref 13.0–17.0)
Immature Granulocytes: 1 %
Lymphocytes Relative: 14 %
Lymphs Abs: 1.2 10*3/uL (ref 0.7–4.0)
MCH: 32.7 pg (ref 26.0–34.0)
MCHC: 33.9 g/dL (ref 30.0–36.0)
MCV: 96.4 fL (ref 80.0–100.0)
Monocytes Absolute: 0.7 10*3/uL (ref 0.1–1.0)
Monocytes Relative: 9 %
Neutro Abs: 6.5 10*3/uL (ref 1.7–7.7)
Neutrophils Relative %: 76 %
Platelets: 200 10*3/uL (ref 150–400)
RBC: 4.16 MIL/uL — ABNORMAL LOW (ref 4.22–5.81)
RDW: 15.7 % — ABNORMAL HIGH (ref 11.5–15.5)
WBC: 8.5 10*3/uL (ref 4.0–10.5)
nRBC: 0 % (ref 0.0–0.2)

## 2024-01-11 LAB — BRAIN NATRIURETIC PEPTIDE: B Natriuretic Peptide: 223 pg/mL — ABNORMAL HIGH (ref 0.0–100.0)

## 2024-01-11 MED ORDER — ALBUTEROL SULFATE (2.5 MG/3ML) 0.083% IN NEBU: 2.5000 mg | INHALATION_SOLUTION | RESPIRATORY_TRACT | Status: DC | PRN

## 2024-01-11 MED ORDER — FUROSEMIDE 10 MG/ML IJ SOLN
40.0000 mg | Freq: Once | INTRAMUSCULAR | Status: AC
  Administered 2024-01-11: 40 mg via INTRAVENOUS
  Filled 2024-01-11: qty 4

## 2024-01-11 NOTE — ED Triage Notes (Signed)
 Pt c/o sob for the past three days, states at night he is unable to lay down because he can not breathe.

## 2024-01-11 NOTE — ED Provider Notes (Signed)
 [place] EMERGENCY DEPARTMENT AT [facility] Provider Note   CSN: [id number] Arrival date & time: 01/11/24  1640     History  Chief Complaint  Patient presents with   Shortness of Breath    Vincent Cooper is a 83 y.o. male.  Patient has a history of congestive heart failure.  Patient states that he has been having more swelling in his legs and difficulty lying back without getting short of breath.  The history is provided by the patient and medical records. No language interpreter was used.  Shortness of Breath Severity:  Moderate Onset quality:  Sudden Timing:  Constant Progression:  Waxing and waning Chronicity:  Recurrent Context: activity   Relieved by:  None tried Worsened by:  Nothing Ineffective treatments:  None tried Associated symptoms: no abdominal pain, no chest pain, no cough, no headaches and no rash   Risk factors: alcohol use        Home Medications Prior to Admission medications   Medication Sig Start Date End Date Taking? Authorizing Provider  ACCU-CHEK GUIDE test strip USE TO TEST BLOOD SUGAREONCE DAILY. 04/02/23   [provider]  Accu-Chek Softclix Lancets lancets daily. 02/11/23   [provider]  albuterol (VENTOLIN HFA) 108 (90 Base) MCG/ACT inhaler Inhale 1 puff into the lungs every 6 (six) hours as needed for wheezing or shortness of breath. 05/02/23   Omar Person, MD  apixaban (ELIQUIS) 2.5 MG TABS tablet Take 1 tablet (2.5 mg total) by mouth 2 (two) times daily. 09/05/22   Lucretia Roers, MD  Blood Glucose Monitoring Suppl (ACCU-CHEK GUIDE) w/Device KIT USE AS DIRECTEDBh 02/11/23   [provider]  Budeson-Glycopyrrol-Formoterol (BREZTRI AEROSPHERE) 160-9-4.8 MCG/ACT AERO Inhale 2 puffs into the lungs in the morning and at bedtime. 05/02/23   Omar Person, MD  diclofenac Sodium (VOLTAREN) 1 % GEL Apply 2 g topically as needed. 03/22/23   [provider]  diltiazem (CARDIZEM CD) 360 MG 24  hr capsule Take 1 capsule (360 mg total) by mouth daily. 02/03/23   Vassie Loll, MD  hydroxypropyl methylcellulose / hypromellose (ISOPTO TEARS / GONIOVISC) 2.5 % ophthalmic solution Place 1 drop into both eyes as needed for dry eyes.    [provider]  Mepolizumab (NUCALA) 100 MG/ML SOAJ Inject 1 mL (100 mg total) into the skin every 28 (twenty-eight) days. 09/22/23   Charlott Holler, MD  metFORMIN (GLUCOPHAGE-XR) 500 MG 24 hr tablet Take 1 tablet (500 mg total) by mouth every morning. 10/20/23   Dani Gobble, NP  metoprolol tartrate (LOPRESSOR) 25 MG tablet Take 1 tablet (25 mg total) by mouth 2 (two) times daily. 06/29/20   Shon Hale, MD  nitroGLYCERIN (NITROSTAT) 0.4 MG SL tablet Place 1 tablet (0.4 mg total) under the tongue every 5 (five) minutes as needed for chest pain. 05/28/23   Marykay Lex, MD  NON FORMULARY Pt uses a cpap nightly    [provider]  ondansetron (ZOFRAN-ODT) 4 MG disintegrating tablet Take 1 tablet (4 mg total) by mouth every 8 (eight) hours as needed for nausea or vomiting. 05/20/23   Rondel Baton, MD  potassium chloride SA (KLOR-CON M) 20 MEQ tablet Take 20 mEq by mouth daily. 01/21/23   [provider]  rosuvastatin (CRESTOR) 40 MG tablet TAKE 1 TABLET BY MOUTH ONCE DAILY. 12/11/23   Marykay Lex, MD  Spacer/Aero-Holding Chambers (AEROCHAMBER MV) inhaler Use as instructed 04/12/22   Omar Person, MD  spironolactone (  ALDACTONE) 25 MG tablet Take 0.5 tablets (12.5 mg total) by mouth daily. 02/02/23 02/02/24  Vassie Loll, MD  tamsulosin (FLOMAX) 0.4 MG CAPS capsule Take 1 capsule (0.4 mg total) by mouth daily. Patient taking differently: Take 0.4 mg by mouth at bedtime. 06/30/20   Shon Hale, MD  torsemide (DEMADEX) 20 MG tablet Take 2 tablets (40 mg total) by mouth 2 (two) times daily. 02/02/23   Vassie Loll, MD      Allergies    Ancef [cefazolin] and Other    Review of Systems   Review of Systems   Constitutional:  Negative for appetite change and fatigue.  HENT:  Negative for congestion, ear discharge and sinus pressure.   Eyes:  Negative for discharge.  Respiratory:  Positive for shortness of breath. Negative for cough.   Cardiovascular:  Negative for chest pain.  Gastrointestinal:  Negative for abdominal pain and diarrhea.  Genitourinary:  Negative for frequency and hematuria.  Musculoskeletal:  Negative for back pain.  Skin:  Negative for rash.  Neurological:  Negative for seizures and headaches.  Psychiatric/Behavioral:  Negative for hallucinations.     Physical Exam Updated Vital Signs BP 132/73   Pulse 74   Temp 98.4 F (36.9 C)   Resp 20   SpO2 95%  Physical Exam Vitals and nursing note reviewed.  Constitutional:      Appearance: He is well-developed.  HENT:     Head: Normocephalic.     Nose: Nose normal.  Eyes:     General: No scleral icterus.    Conjunctiva/sclera: Conjunctivae normal.  Neck:     Thyroid: No thyromegaly.  Cardiovascular:     Rate and Rhythm: Normal rate and regular rhythm.     Heart sounds: No murmur heard.    No friction rub. No gallop.  Pulmonary:     Breath sounds: No stridor. No wheezing or rales.  Chest:     Chest wall: No tenderness.  Abdominal:     General: There is no distension.     Tenderness: There is no abdominal tenderness. There is no rebound.  Musculoskeletal:        General: Normal range of motion.     Cervical back: Neck supple.  Lymphadenopathy:     Cervical: No cervical adenopathy.  Skin:    Findings: No erythema or rash.  Neurological:     Mental Status: He is alert and oriented to person, place, and time.     Motor: No abnormal muscle tone.     Coordination: Coordination normal.  Psychiatric:        Behavior: Behavior normal.     ED Results / Procedures / Treatments   Labs (all labs ordered are listed, but only abnormal results are displayed) Labs Reviewed  CBC WITH DIFFERENTIAL/PLATELET -  Abnormal; Notable for the following components:      Result Value   RBC 4.16 (*)    RDW 15.7 (*)    All other components within normal limits  COMPREHENSIVE METABOLIC PANEL - Abnormal; Notable for the following components:   Glucose, Bld 158 (*)    Calcium 8.5 (*)    Total Protein 6.4 (*)    Total Bilirubin 1.6 (*)    All other components within normal limits  BRAIN NATRIURETIC PEPTIDE - Abnormal; Notable for the following components:   B Natriuretic Peptide 223.0 (*)    All other components within normal limits    EKG None  Radiology DG Chest 2 View Result Date: 01/11/2024 CLINICAL  DATA:  Shortness of breath x3 days. EXAM: CHEST - 2 VIEW COMPARISON:  May 17, 2023 FINDINGS: The cardiac silhouette is enlarged and unchanged in size. Marked severity calcification of the aortic arch is noted. Mild atelectasis is seen within the bilateral lung bases. Small bilateral pleural effusions are present. Flattening of the hemidiaphragms is also noted. No pneumothorax is identified. Multilevel degenerative changes are seen throughout the thoracic spine. IMPRESSION: Stable cardiomegaly with mild bibasilar atelectasis and small bilateral pleural effusions. Electronically Signed   By: Aram Candela M.D.   On: 01/11/2024 17:41    Procedures Procedures    Medications Ordered in ED Medications  albuterol (PROVENTIL) (2.5 MG/3ML) 0.083% nebulizer solution 2.5 mg (has no administration in time range)  furosemide (LASIX) injection 40 mg (40 mg Intravenous Given 01/11/24 1816)    ED Course/ Medical Decision Making/ A&P  Patient was given 40 mg of Lasix IV and urinated over 1500 cc.  Patient feels much better                               Medical Decision Making Amount and/or Complexity of Data Reviewed Labs: ordered. Radiology: ordered.  Risk Prescription drug management.   Patient with worsening congestive heart failure that improved with IV Lasix.  He will follow-up with his PCP or his  cardiologist        Final Clinical Impression(s) / ED Diagnoses Final diagnoses:  Systolic congestive heart failure, unspecified HF chronicity (HCC)    Rx / DC Orders ED Discharge Orders     None         Bethann Berkshire, MD 01/18/24 1711

## 2024-01-11 NOTE — Discharge Instructions (Signed)
 Follow-up with either your cardiologist or your family doctor next week

## 2024-02-03 ENCOUNTER — Other Ambulatory Visit: Payer: Self-pay | Admitting: Pharmacy Technician

## 2024-02-03 ENCOUNTER — Other Ambulatory Visit: Payer: Self-pay

## 2024-02-03 NOTE — Progress Notes (Signed)
 Specialty Pharmacy Refill Coordination Note  Vincent Cooper is a 83 y.o. male contacted today regarding refills of specialty medication(s) Mepolizumab [name]   Patient requested Delivery   Delivery date: 02/06/24   Verified address: [address] [place]   Medication will be filled on 02/05/24.

## 2024-02-05 ENCOUNTER — Other Ambulatory Visit: Payer: Self-pay

## 2024-02-18 ENCOUNTER — Ambulatory Visit (INDEPENDENT_AMBULATORY_CARE_PROVIDER_SITE_OTHER): Payer: Medicare HMO | Admitting: Nurse Practitioner

## 2024-02-18 ENCOUNTER — Encounter: Payer: Self-pay | Admitting: Nurse Practitioner

## 2024-02-18 VITALS — BP 102/60 | HR 65 | Ht 69.0 in | Wt 265.2 lb

## 2024-02-18 DIAGNOSIS — E782 Mixed hyperlipidemia: Secondary | ICD-10-CM | POA: Diagnosis not present

## 2024-02-18 DIAGNOSIS — Z7984 Long term (current) use of oral hypoglycemic drugs: Secondary | ICD-10-CM

## 2024-02-18 DIAGNOSIS — I10 Essential (primary) hypertension: Secondary | ICD-10-CM

## 2024-02-18 DIAGNOSIS — E1122 Type 2 diabetes mellitus with diabetic chronic kidney disease: Secondary | ICD-10-CM

## 2024-02-18 DIAGNOSIS — N1831 Chronic kidney disease, stage 3a: Secondary | ICD-10-CM

## 2024-02-18 MED ORDER — METFORMIN HCL ER 500 MG PO TB24: 500.0000 mg | ORAL_TABLET | Freq: Every morning | ORAL | 1 refills | Status: DC

## 2024-02-18 NOTE — Progress Notes (Signed)
 Endocrinology Follow Up Note       02/18/2024, 3:36 PM   Subjective:    Patient ID: Vincent Cooper, male    DOB: [birth year].  Vincent Cooper is being seen in follow up after being seen in consultation for management of currently uncontrolled symptomatic diabetes requested by  Estanislado Pandy, MD.   Past Medical History:  Diagnosis Date   CHF (congestive heart failure) (HCC)    Chronic gout    COPD (chronic obstructive pulmonary disease) (HCC)    Oxygen dependent   Coronary artery disease due to calcified coronary lesion 03/08/2020   CORONARY CTA: Cor Ca++ Score 1651 !!: ~ 50% LM (CTFFR 1 - NS). Large Dom RCA-<PDA-PAV/PL - diffuse mild-mod plaque: prox (25-49%), mid (50-69%) CTFFR (p 0.99, m 0.85, d 0.81 - NS).  Med-Size LAD - prox-mid long diffuse Mod-Severe Ca++ plaque (~50-69%, ?>70%): CTFFR p 0.95, m 0.88, d 0.86 -NS.  CTFFR LCx 0.94, OM1 0.90 - NS. (NS=Not Significant).   Diabetes mellitus type II, non insulin dependent (HCC)    Hypertension    Morbid obesity (HCC)    BMI of 38.5 with multiple risk factors.   OSA on CPAP    Pulmonary emboli (HCC) 10/2019   Chest CTA-4 Phadke opacification of main PA but there is partially occlusive main posterior RLL and segmental/segmental branches.  Small thrombus noted in the anterior right middle lobe.  No RV strain.  Scattered aortic atherosclerosis involving great vessels.  Coronary calcification noted.   RLS (restless legs syndrome)     Past Surgical History:  Procedure Laterality Date   BOWEL RESECTION N/A 10/18/2020   Procedure: SMALL BOWEL RESECTION;  Surgeon: Lucretia Roers, MD;  Location: [facility];  Service: General;  Laterality: N/A;   Cataract surgery Right    COLONOSCOPY WITH PROPOFOL N/A 09/27/2020   Procedure: COLONOSCOPY WITH PROPOFOL;  Surgeon: Malissa Hippo, MD;  Location: [facility];  Service: Endoscopy;  Laterality: N/A;  1:15   COLOSTOMY REVERSAL N/A  10/18/2020   Procedure: COLOSTOMY REVERSAL;  Surgeon: Lucretia Roers, MD;  Location: [facility];  Service: General;  Laterality: N/A;   LAPAROTOMY N/A 06/21/2020   Procedure: EXPLORATORY LAPAROTOMY,  bowel resection, creation ostomy;  Surgeon: Lucretia Roers, MD;  Location: [facility];  Service: General;  Laterality: N/A;   POLYPECTOMY  09/27/2020   Procedure: POLYPECTOMY;  Surgeon: Malissa Hippo, MD;  Location: [facility];  Service: Endoscopy;;   TRANSTHORACIC ECHOCARDIOGRAM  01/31/2023   Normal LV size and function with EF 65 to 70%.  No RWMA.  Unable to assess diastolic function.  Normal RV size and function.  Normal RVP and mildly elevated RAP.  Mild LA dilation and moderate RA dilation.  Moderate AoV calcification with mild AAS (mean AVG 13 mmHg). => Mild AS now present   VENTRAL HERNIA REPAIR N/A 09/02/2022   Procedure: HERNIA REPAIR VENTRAL ADULT WITH  MESH;  Surgeon: Lucretia Roers, MD;  Location: [facility];  Service: General;  Laterality: N/A;    Social History   Socioeconomic History   Marital status: Legally Separated    Spouse name: Not on file   Number of children: 3  Years of education: Not on file   Highest education level: Not on file  Occupational History   Occupation: retired  Tobacco Use   Smoking status: Former    Current packs/day: 0.00    Average packs/day: 2.0 packs/day for 55.0 years (110.0 ttl pk-yrs)    Types: Cigarettes    Start date: 11/18/1946    Quit date: 11/18/2001    Years since quitting: 22.2   Smokeless tobacco: Former  Building services engineer status: Never Used  Substance and Sexual Activity   Alcohol use: Yes    Comment: sometimes   Drug use: Never   Sexual activity: Not Currently  Other Topics Concern   Not on file  Social History Narrative   Not on file   Social Drivers of Health   Financial Resource Strain: Not on file  Food Insecurity: No Food Insecurity (02/01/2023)   Hunger Vital Sign    Worried About Running Out of Food in the  Last Year: Never true    Ran Out of Food in the Last Year: Never true  Transportation Needs: No Transportation Needs (02/01/2023)   PRAPARE - Administrator, Civil Service (Medical): No    Lack of Transportation (Non-Medical): No  Physical Activity: Not on file  Stress: Not on file  Social Connections: Not on file    Family History  Problem Relation Age of Onset   Colon cancer Mother    Emphysema Father        smoked   Diverticulitis Sister    Diabetes Brother    Diabetes Brother    Heart Problems Maternal Grandmother    Breast cancer Daughter    Lupus Son    CAD Neg Hx     Outpatient Encounter Medications as of 02/18/2024  Medication Sig   ACCU-CHEK GUIDE test strip USE TO TEST BLOOD SUGAREONCE DAILY.   Accu-Chek Softclix Lancets lancets daily.   albuterol (VENTOLIN HFA) 108 (90 Base) MCG/ACT inhaler Inhale 1 puff into the lungs every 6 (six) hours as needed for wheezing or shortness of breath.   apixaban (ELIQUIS) 2.5 MG TABS tablet Take 1 tablet (2.5 mg total) by mouth 2 (two) times daily.   Blood Glucose Monitoring Suppl (ACCU-CHEK GUIDE) w/Device KIT USE AS DIRECTEDBh   Budeson-Glycopyrrol-Formoterol (BREZTRI AEROSPHERE) 160-9-4.8 MCG/ACT AERO Inhale 2 puffs into the lungs in the morning and at bedtime.   diclofenac Sodium (VOLTAREN) 1 % GEL Apply 2 g topically as needed.   diltiazem (CARDIZEM CD) 360 MG 24 hr capsule Take 1 capsule (360 mg total) by mouth daily.   hydroxypropyl methylcellulose / hypromellose (ISOPTO TEARS / GONIOVISC) 2.5 % ophthalmic solution Place 1 drop into both eyes as needed for dry eyes.   Mepolizumab (NUCALA) 100 MG/ML SOAJ Inject 1 mL (100 mg total) into the skin every 28 (twenty-eight) days.   metoprolol tartrate (LOPRESSOR) 25 MG tablet Take 1 tablet (25 mg total) by mouth 2 (two) times daily.   nitroGLYCERIN (NITROSTAT) 0.4 MG SL tablet Place 1 tablet (0.4 mg total) under the tongue every 5 (five) minutes as needed for chest pain.    NON FORMULARY Pt uses a cpap nightly   ondansetron (ZOFRAN-ODT) 4 MG disintegrating tablet Take 1 tablet (4 mg total) by mouth every 8 (eight) hours as needed for nausea or vomiting.   potassium chloride SA (KLOR-CON M) 20 MEQ tablet Take 20 mEq by mouth daily.   rosuvastatin (CRESTOR) 40 MG tablet TAKE 1 TABLET BY MOUTH ONCE DAILY.  Spacer/Aero-Holding Chambers (AEROCHAMBER MV) inhaler Use as instructed   tamsulosin (FLOMAX) 0.4 MG CAPS capsule Take 1 capsule (0.4 mg total) by mouth daily. (Patient taking differently: Take 0.4 mg by mouth at bedtime.)   torsemide (DEMADEX) 20 MG tablet Take 2 tablets (40 mg total) by mouth 2 (two) times daily.   [DISCONTINUED] metFORMIN (GLUCOPHAGE-XR) 500 MG 24 hr tablet Take 1 tablet (500 mg total) by mouth every morning.   metFORMIN (GLUCOPHAGE-XR) 500 MG 24 hr tablet Take 1 tablet (500 mg total) by mouth every morning.   spironolactone (ALDACTONE) 25 MG tablet Take 0.5 tablets (12.5 mg total) by mouth daily.   No facility-administered encounter medications on file as of 02/18/2024.    ALLERGIES: Allergies  Allergen Reactions   Ancef [Cefazolin] Itching and Rash   Other     Patient reports he was allergic to something in an IV he was given but does not know what the substance was. As of 01/19/2020     VACCINATION STATUS: Immunization History  Administered Date(s) Administered   Fluad Trivalent(High Dose 65+) 07/30/2023   Influenza Inj Mdck Quad With Preservative 08/28/2018   Influenza,inj,quad, With Preservative 08/29/2015    Diabetes He presents for his follow-up diabetic visit. He has type 2 diabetes mellitus. Onset time: diagnosed at approx age of 87. His disease course has been improving. There are no hypoglycemic associated symptoms. Associated symptoms include fatigue and foot paresthesias. There are no hypoglycemic complications. Diabetic complications include heart disease (CHF, CAD with Afib), nephropathy and peripheral neuropathy. Risk  factors for coronary artery disease include diabetes mellitus, dyslipidemia, family history, obesity, male sex, hypertension and sedentary lifestyle. Current diabetic treatment includes diet and oral agent (monotherapy). He is compliant with treatment most of the time. His weight is fluctuating minimally. He is following a generally healthy diet. When asked about meal planning, he reported none. He has not had a previous visit with a dietitian. He rarely participates in exercise. His home blood glucose trend is fluctuating minimally. His breakfast blood glucose range is generally 140-180 mg/dl. (He presents today with his logs showing at goal glycemic profile overall.  His POCT A1c today is 5.7%, improving from last visit of 6.2%.  He denies any hypoglycemia.) An ACE inhibitor/angiotensin II receptor blocker is not being taken. He does not see a podiatrist.Eye exam is not current.     Review of systems  Constitutional: + Minimally fluctuating body weight,  current Body mass index is 39.16 kg/m. , no fatigue, no subjective hyperthermia, no subjective hypothermia Eyes: no blurry vision, no xerophthalmia ENT: no sore throat, no nodules palpated in throat, no dysphagia/odynophagia, no hoarseness Cardiovascular: no chest pain, no shortness of breath, no palpitations, ++ leg swelling Respiratory: no cough, no shortness of breath Gastrointestinal: no nausea/vomiting/diarrhea Musculoskeletal: no muscle/joint aches Skin: no rashes, no hyperemia Neurological: no tremors, no numbness, no tingling, no dizziness Psychiatric: no depression, no anxiety  Objective:     BP 102/60 (BP Location: Right Arm, Patient Position: Sitting, Cuff Size: Large)   Pulse 65   Ht 5\' 9"  (1.753 m)   Wt 265 lb 3.2 oz (120.3 kg)   BMI 39.16 kg/m   Wt Readings from Last 3 Encounters:  02/18/24 265 lb 3.2 oz (120.3 kg)  10/20/23 271 lb 12.8 oz (123.3 kg)  07/30/23 275 lb 9.6 oz (125 kg)     BP Readings from Last 3  Encounters:  02/18/24 102/60  01/11/24 (!) 112/92  10/20/23 124/79     Physical Exam-  Limited  Constitutional:  Body mass index is 39.16 kg/m. , not in acute distress, normal state of mind Eyes:  EOMI, no exophthalmos Musculoskeletal: no gross deformities, strength intact in all four extremities, no gross restriction of joint movements Skin:  no rashes, no hyperemia Neurological: no tremor with outstretched hands  Diabetic Foot Exam - Simple   No data filed      CMP ( most recent) CMP     Component Value Date/Time   NA 138 01/11/2024 1820   NA 134 02/20/2023 0855   K 4.0 01/11/2024 1820   CL 102 01/11/2024 1820   CO2 24 01/11/2024 1820   GLUCOSE 158 (H) 01/11/2024 1820   BUN 23 01/11/2024 1820   BUN 28 (H) 02/20/2023 0855   CREATININE 1.20 01/11/2024 1820   CREATININE 1.16 04/12/2022 1618   CALCIUM 8.5 (L) 01/11/2024 1820   PROT 6.4 (L) 01/11/2024 1820   PROT 6.2 03/20/2020 1151   ALBUMIN 3.5 01/11/2024 1820   ALBUMIN 4.1 03/20/2020 1151   AST 26 01/11/2024 1820   ALT 29 01/11/2024 1820   ALKPHOS 48 01/11/2024 1820   BILITOT 1.6 (H) 01/11/2024 1820   BILITOT 0.4 03/20/2020 1151   GFRNONAA >60 01/11/2024 1820   GFRAA 39 (L) 06/29/2020 0348     Diabetic Labs (most recent): Lab Results  Component Value Date   HGBA1C 6.2 (A) 10/20/2023   HGBA1C 5.5 06/17/2023   HGBA1C 6.8 (H) 01/30/2023   MICROALBUR 30mg /L 10/20/2023     Lipid Panel ( most recent) Lipid Panel     Component Value Date/Time   CHOL 161 02/20/2023 0855   TRIG 109 02/20/2023 0855   HDL 82 02/20/2023 0855   CHOLHDL 2.0 02/20/2023 0855   LDLCALC 60 02/20/2023 0855   LABVLDL 19 02/20/2023 0855      Lab Results  Component Value Date   TSH 3.583 01/30/2023   TSH 4.978 (H) 10/20/2020   FREET4 1.03 10/21/2020           Assessment & Plan:   1) Type 2 diabetes mellitus with stage 3a chronic kidney disease, without long-term current use of insulin (HCC)  He presents today with his  logs showing at goal glycemic profile overall.  His POCT A1c today is 5.7%, improving from last visit of 6.2%.  He denies any hypoglycemia.  - Vincent Cooper has currently uncontrolled symptomatic type 2 DM since 83 years of age.   -Recent labs reviewed.  - I had a long discussion with him about the progressive nature of diabetes and the pathology behind its complications. -his diabetes is complicated by CKD stage 3, neuropathy and he remains at a high risk for more acute and chronic complications which include CAD, CVA, CKD, retinopathy, and neuropathy. These are all discussed in detail with him.  The following Lifestyle Medicine recommendations according to American College of Lifestyle Medicine [facility]) were discussed and offered to patient and he agrees to start the journey:  A. Whole Foods, Plant-based plate comprising of fruits and vegetables, plant-based proteins, whole-grain carbohydrates was discussed in detail with the patient.   A list for source of those nutrients were also provided to the patient.  Patient will use only water or unsweetened tea for hydration. B.  The need to stay away from risky substances including alcohol, smoking; obtaining 7 to 9 hours of restorative sleep, at least 150 minutes of moderate intensity exercise weekly, the importance of healthy social connections,  and stress reduction techniques were discussed. C.  A full color page of  Calorie density of various food groups per pound showing examples of each food groups was provided to the patient.  - Nutritional counseling repeated at each appointment due to patients tendency to fall back in to old habits.  - The patient admits there is a room for improvement in their diet and drink choices. -  Suggestion is made for the patient to avoid simple carbohydrates from their diet including Cakes, Sweet Desserts / Pastries, Ice Cream, Soda (diet and regular), Sweet Tea, Candies, Chips, Cookies, Sweet Pastries, Store Bought  Juices, Alcohol in Excess of 1-2 drinks a day, Artificial Sweeteners, Coffee Creamer, and "Sugar-free" Products. This will help patient to have stable blood glucose profile and potentially avoid unintended weight gain.   - I encouraged the patient to switch to unprocessed or minimally processed complex starch and increased protein intake (animal or plant source), fruits, and vegetables.   - Patient is advised to stick to a routine mealtimes to eat 3 meals a day and avoid unnecessary snacks (to snack only to correct hypoglycemia).  - I have approached him with the following individualized plan to manage his diabetes and patient agrees:   -He is advised to continue Metformin 500 mg ER daily after breakfast (to minimize GI upset).    -he can take a break from routine glucose monitoring given his safe medication regimen.   - Adjustment parameters are given to him for hypo and hyperglycemia in writing.  - Specific targets for  A1c; LDL, HDL, and Triglycerides were discussed with the patient.  2) Blood Pressure /Hypertension:  his blood pressure is controlled to target.   he is advised to continue his current medications as prescribed by his PCP.  3) Lipids/Hyperlipidemia:    Review of his recent lipid panel from 02/20/23 showed controlled LDL at 60 .  he is advised to continue Crestor 40 mg daily at bedtime.  Side effects and precautions discussed with him.  4)  Weight/Diet:  his Body mass index is 39.16 kg/m.  -  clearly complicating his diabetes care.   he is a candidate for weight loss. I discussed with him the fact that loss of 5 - 10% of his  current body weight will have the most impact on his diabetes management.  Exercise, and detailed carbohydrates information provided  -  detailed on discharge instructions.  5) Chronic Care/Health Maintenance: -he is not on ACEI/ARB and is on Statin medications and is encouraged to initiate and continue to follow up with Ophthalmology, Dentist,  Podiatrist at least yearly or according to recommendations, and advised to stay away from smoking. I have recommended yearly flu vaccine and pneumonia vaccine at least every 5 years; moderate intensity exercise for up to 150 minutes weekly; and sleep for at least 7 hours a day.  - he is advised to maintain close follow up with Sasser, Clarene Critchley, MD for primary care needs, as well as his other providers for optimal and coordinated care.     I spent  23  minutes in the care of the patient today including review of labs from CMP, Lipids, Thyroid Function, Hematology (current and previous including abstractions from other facilities); face-to-face time discussing  his blood glucose readings/logs, discussing hypoglycemia and hyperglycemia episodes and symptoms, medications doses, his options of short and long term treatment based on the latest standards of care / guidelines;  discussion about incorporating lifestyle medicine;  and documenting the encounter. Risk reduction counseling performed per USPSTF guidelines  to reduce obesity and cardiovascular risk factors.     Please refer to Patient Instructions for Blood Glucose Monitoring and Insulin/Medications Dosing Guide"  in media tab for additional information. Please  also refer to " Patient Self Inventory" in the Media  tab for reviewed elements of pertinent patient history.  Vincent Cooper participated in the discussions, expressed understanding, and voiced agreement with the above plans.  All questions were answered to his satisfaction. he is encouraged to contact clinic should he have any questions or concerns prior to his return visit.     Follow up plan: - Return in about 6 months (around 08/19/2024) for Diabetes F/U with A1c in office, No previsit labs.   Ronny Bacon, [facility] [address] [place], [place] [address] Phone: [phone] Fax: [phone]  02/18/2024, 3:36 PM

## 2024-03-02 ENCOUNTER — Ambulatory Visit (INDEPENDENT_AMBULATORY_CARE_PROVIDER_SITE_OTHER): Admitting: Internal Medicine

## 2024-03-02 ENCOUNTER — Other Ambulatory Visit: Payer: Self-pay

## 2024-03-02 ENCOUNTER — Encounter: Payer: Self-pay | Admitting: Internal Medicine

## 2024-03-02 ENCOUNTER — Other Ambulatory Visit: Payer: Self-pay | Admitting: Internal Medicine

## 2024-03-02 ENCOUNTER — Other Ambulatory Visit (HOSPITAL_COMMUNITY): Payer: Self-pay

## 2024-03-02 VITALS — BP 126/84 | HR 66 | Ht 66.0 in | Wt 265.4 lb

## 2024-03-02 DIAGNOSIS — G4733 Obstructive sleep apnea (adult) (pediatric): Secondary | ICD-10-CM | POA: Diagnosis not present

## 2024-03-02 DIAGNOSIS — J4489 Other specified chronic obstructive pulmonary disease: Secondary | ICD-10-CM

## 2024-03-02 DIAGNOSIS — D7219 Other eosinophilia: Secondary | ICD-10-CM

## 2024-03-02 MED ORDER — BREZTRI AEROSPHERE 160-9-4.8 MCG/ACT IN AERO: 2.0000 | INHALATION_SPRAY | Freq: Two times a day (BID) | RESPIRATORY_TRACT | Status: AC

## 2024-03-02 MED ORDER — NUCALA 100 MG/ML ~~LOC~~ SOAJ
100.0000 mg | SUBCUTANEOUS | 1 refills | Status: DC
  Filled 2024-03-02: qty 1, 28d supply, fill #0
  Filled 2024-03-29: qty 1, 28d supply, fill #1
  Filled 2024-04-27: qty 1, 28d supply, fill #2
  Filled 2024-05-27: qty 1, 28d supply, fill #3
  Filled 2024-06-22: qty 1, 28d supply, fill #4
  Filled 2024-07-21: qty 1, 28d supply, fill #5

## 2024-03-02 NOTE — Telephone Encounter (Signed)
 Refill sent for [facility] to [facility]: [phone]   Dose: 100mg  subcut every 4 weeks  Last OV: 03/02/2024 Provider: Dr. Dione Franks  Next OV: due in 6 months  Geraldene Kleine, PharmD, MPH, BCPS Clinical Pharmacist (Rheumatology and Pulmonology)

## 2024-03-02 NOTE — Telephone Encounter (Signed)
 Refill pending OV from today

## 2024-03-02 NOTE — Patient Instructions (Addendum)
 It was a pleasure to see you today!  Please schedule follow up with myself in 4 months.  If my schedule is not open yet, we will contact you with a reminder closer to that time. Please call [phone] if you haven't heard from us  a month before, and always call us  sooner if issues or concerns arise. You can also send us  a message through MyChart, but but aware that this is not to be used for urgent issues and it may take up to 5-7 days to receive a reply. Please be aware that you will likely be able to view your results before I have a chance to respond to them. Please give us  5 business days to respond to any non-urgent results.    Before your next visit I would like you to have: Sleep study - I have ordered this and will call you to schedule  I will prescribe the new CPAP machine when I have the results and we can follow up 31-90 days after you have received the new machine.   Continue the breztri 2 puffs twice daily, gargle after use Continue albuterol inhaler as needed for shortness of breath.  Continue the monthly nucala injections - will renew this today

## 2024-03-02 NOTE — Progress Notes (Signed)
 Vuong Musa    [id number]    11-Oct-1941  Primary Care Physician:Sasser, Clarene Critchley, MD Date of Appointment: 03/02/2024 Established Patient Visit  Chief complaint:   Chief Complaint  Patient presents with   Follow-up    Patient states get sob and fatigued.     HPI: Vincent Cooper is a 83 y.o. 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   Interval Updates: Here for follow up. No interval hospitalizations or prednisone use. Had ED visit for CHF exacerbation in feb 2025.  Current treatment breztri 2 puffs twice daily, albuterol prn, mepolizumab. Feels good on nucala, no side effects.  Does get sob with activity and exertion but able to do all his ADLs. Ambulates with cane. Overall breathing is much better on nucala.  OSA on CPAP - [employer] - says his machine doesn't have a chip. Has had this machine about ten years.    Living on his own, independent with ADLs   I have reviewed the patient's family social and past medical history and updated as appropriate.   Past Medical History:  Diagnosis Date   CHF (congestive heart failure) (HCC)    Chronic gout    COPD (chronic obstructive pulmonary disease) (HCC)    Oxygen dependent   Coronary artery disease due to calcified coronary lesion 03/08/2020   CORONARY CTA: Cor Ca++ Score 1651 !!: ~ 50% LM (CTFFR 1 - NS). Large Dom RCA-<PDA-PAV/PL - diffuse mild-mod plaque: prox (25-49%), mid (50-69%) CTFFR (p 0.99, m 0.85, d 0.81 - NS).  Med-Size LAD - prox-mid long diffuse Mod-Severe Ca++ plaque (~50-69%, ?>70%): CTFFR p 0.95, m 0.88, d 0.86 -NS.  CTFFR LCx 0.94, OM1 0.90 - NS. (NS=Not Significant).   Diabetes mellitus type II, non insulin dependent (HCC)    Hypertension    Morbid obesity (HCC)    BMI of 38.5 with multiple risk factors.   OSA on CPAP    Pulmonary emboli (HCC) 10/2019   Chest CTA-4 Phadke opacification of main PA but there is partially occlusive main posterior RLL and segmental/segmental  branches.  Small thrombus noted in the anterior right middle lobe.  No RV strain.  Scattered aortic atherosclerosis involving great vessels.  Coronary calcification noted.   RLS (restless legs syndrome)     Past Surgical History:  Procedure Laterality Date   BOWEL RESECTION N/A 10/18/2020   Procedure: SMALL BOWEL RESECTION;  Surgeon: Lucretia Roers, MD;  Location: [facility];  Service: General;  Laterality: N/A;   Cataract surgery Right    COLONOSCOPY WITH PROPOFOL N/A 09/27/2020   Procedure: COLONOSCOPY WITH PROPOFOL;  Surgeon: Malissa Hippo, MD;  Location: [facility];  Service: Endoscopy;  Laterality: N/A;  1:15   COLOSTOMY REVERSAL N/A 10/18/2020   Procedure: COLOSTOMY REVERSAL;  Surgeon: Lucretia Roers, MD;  Location: [facility];  Service: General;  Laterality: N/A;   LAPAROTOMY N/A 06/21/2020   Procedure: EXPLORATORY LAPAROTOMY,  bowel resection, creation ostomy;  Surgeon: Lucretia Roers, MD;  Location: [facility];  Service: General;  Laterality: N/A;   POLYPECTOMY  09/27/2020   Procedure: POLYPECTOMY;  Surgeon: Malissa Hippo, MD;  Location: [facility];  Service: Endoscopy;;   TRANSTHORACIC ECHOCARDIOGRAM  01/31/2023   Normal LV size and function with EF 65 to 70%.  No RWMA.  Unable to assess diastolic function.  Normal RV size and function.  Normal RVP and mildly elevated RAP.  Mild LA dilation and moderate RA  dilation.  Moderate AoV calcification with mild AAS (mean AVG 13 mmHg). => Mild AS now present   VENTRAL HERNIA REPAIR N/A 09/02/2022   Procedure: HERNIA REPAIR VENTRAL ADULT WITH  MESH;  Surgeon: Awilda Bogus, MD;  Location: [facility];  Service: General;  Laterality: N/A;    Family History  Problem Relation Age of Onset   Colon cancer Mother    Emphysema Father        smoked   Diverticulitis Sister    Diabetes Brother    Diabetes Brother    Heart Problems Maternal Grandmother    Breast cancer Daughter    Lupus Son    CAD Neg Hx     Social History    Occupational History   Occupation: retired  Tobacco Use   Smoking status: Former    Current packs/day: 0.00    Average packs/day: 2.0 packs/day for 55.0 years (110.0 ttl pk-yrs)    Types: Cigarettes    Start date: 11/18/1946    Quit date: 11/18/2001    Years since quitting: 22.3   Smokeless tobacco: Former  Building services engineer status: Never Used  Substance and Sexual Activity   Alcohol use: Yes    Comment: sometimes   Drug use: Never   Sexual activity: Not Currently     Physical Exam: Blood pressure 126/84, pulse 66, height 5\' 6"  (1.676 m), weight 265 lb 6.4 oz (120.4 kg), SpO2 97%.  Gen:     No distress, obese ENT:  no thrush Lungs:    diminished, no wheeze CV:         irregularly irregular HR in 60s, systolic murmur Abd: obese, soft  Data Reviewed: Imaging: I have personally reviewed the chest xray June 2024 - shows cardiomegaly  PFTs:     Latest Ref Rng & Units 06/07/2022   12:49 PM  PFT Results  FVC-Pre L 2.61   FVC-Predicted Pre % 77   FVC-Post L 2.60   FVC-Predicted Post % 76   Pre FEV1/FVC % % 67   Post FEV1/FCV % % 67   FEV1-Pre L 1.75   FEV1-Predicted Pre % 73   FEV1-Post L 1.73   DLCO uncorrected ml/min/mmHg 16.34   DLCO UNC% % 76   DLCO corrected ml/min/mmHg 16.34   DLCO COR %Predicted % 76   DLVA Predicted % 87   TLC L 5.60   TLC % Predicted % 89   RV % Predicted % 116    I have personally reviewed the patient's PFTs and mild airflow limitation   Labs: Lab Results  Component Value Date   WBC 8.5 01/11/2024   HGB 13.6 01/11/2024   HCT 40.1 01/11/2024   MCV 96.4 01/11/2024   PLT 200 01/11/2024   Lab Results  Component Value Date   NA 138 01/11/2024   K 4.0 01/11/2024   CO2 24 01/11/2024   GLUCOSE 158 (H) 01/11/2024   BUN 23 01/11/2024   CREATININE 1.20 01/11/2024   CALCIUM 8.5 (L) 01/11/2024   EGFR 47 (L) 02/20/2023   GFRNONAA >60 01/11/2024    Immunization status: Immunization History  Administered Date(s) Administered    Fluad Trivalent(High Dose 65+) 07/30/2023   Influenza Inj Mdck Quad With Preservative 08/28/2018   Influenza,inj,quad, With Preservative 08/29/2015    External Records Personally Reviewed: pulmonary  Assessment:  Asthma COPD overlap syndrome with improved control on nucala Peripheral eosinophilia Osa on cpap  Plan/Recommendations: Sleep study - I have ordered this and will call you to  schedule  I will prescribe the new CPAP machine when I have the results and we can follow up 31-90 days after you have received the new machine.   Continue the breztri 2 puffs twice daily, gargle after use Continue albuterol inhaler as needed for shortness of breath.  Continue the monthly nucala injections - will renew this today  Quit smoking over 20 years ago. Outside the window for ldct for lung cancer screening   Return to Care: Return in about 4 months (around 07/02/2024).   Louie Rover, MD Pulmonary and Critical Care Medicine [facility] Office:[phone]

## 2024-03-02 NOTE — Telephone Encounter (Signed)
 Please advise specialty med

## 2024-03-02 NOTE — Progress Notes (Signed)
 Specialty Pharmacy Refill Coordination Note  Vincent Cooper is a 83 y.o. male contacted today regarding refills of specialty medication(s) Mepolizumab [name])   Patient requested Delivery   Delivery date: 03/09/24   Verified address: [address] [name]   Medication will be filled on 03/08/24, pending refill approval.

## 2024-03-08 ENCOUNTER — Other Ambulatory Visit: Payer: Self-pay

## 2024-03-11 DIAGNOSIS — I5033 Acute on chronic diastolic (congestive) heart failure: Secondary | ICD-10-CM | POA: Diagnosis not present

## 2024-03-11 DIAGNOSIS — L03116 Cellulitis of left lower limb: Secondary | ICD-10-CM | POA: Diagnosis not present

## 2024-03-11 DIAGNOSIS — N1831 Chronic kidney disease, stage 3a: Secondary | ICD-10-CM | POA: Diagnosis not present

## 2024-03-11 DIAGNOSIS — E1122 Type 2 diabetes mellitus with diabetic chronic kidney disease: Secondary | ICD-10-CM | POA: Diagnosis not present

## 2024-03-11 DIAGNOSIS — Z6839 Body mass index (BMI) 39.0-39.9, adult: Secondary | ICD-10-CM | POA: Diagnosis not present

## 2024-03-12 ENCOUNTER — Encounter (HOSPITAL_COMMUNITY): Payer: Self-pay

## 2024-03-12 ENCOUNTER — Emergency Department (HOSPITAL_COMMUNITY)

## 2024-03-12 ENCOUNTER — Other Ambulatory Visit: Payer: Self-pay

## 2024-03-12 ENCOUNTER — Inpatient Hospital Stay (HOSPITAL_COMMUNITY)
Admission: EM | Admit: 2024-03-12 | Discharge: 2024-03-14 | DRG: 603 | Disposition: A | Discharge status: Home / Self Care | Attending: Family Medicine | Admitting: Family Medicine

## 2024-03-12 DIAGNOSIS — N400 Enlarged prostate without lower urinary tract symptoms: Secondary | ICD-10-CM | POA: Diagnosis present

## 2024-03-12 DIAGNOSIS — I130 Hypertensive heart and chronic kidney disease with heart failure and stage 1 through stage 4 chronic kidney disease, or unspecified chronic kidney disease: Secondary | ICD-10-CM | POA: Diagnosis present

## 2024-03-12 DIAGNOSIS — I5032 Chronic diastolic (congestive) heart failure: Secondary | ICD-10-CM | POA: Diagnosis present

## 2024-03-12 DIAGNOSIS — L03116 Cellulitis of left lower limb: Secondary | ICD-10-CM | POA: Diagnosis not present

## 2024-03-12 DIAGNOSIS — Z7901 Long term (current) use of anticoagulants: Secondary | ICD-10-CM | POA: Diagnosis not present

## 2024-03-12 DIAGNOSIS — E119 Type 2 diabetes mellitus without complications: Secondary | ICD-10-CM

## 2024-03-12 DIAGNOSIS — I2510 Atherosclerotic heart disease of native coronary artery without angina pectoris: Secondary | ICD-10-CM | POA: Diagnosis present

## 2024-03-12 DIAGNOSIS — M79605 Pain in left leg: Secondary | ICD-10-CM | POA: Diagnosis not present

## 2024-03-12 DIAGNOSIS — Z7984 Long term (current) use of oral hypoglycemic drugs: Secondary | ICD-10-CM

## 2024-03-12 DIAGNOSIS — Z9981 Dependence on supplemental oxygen: Secondary | ICD-10-CM

## 2024-03-12 DIAGNOSIS — E1122 Type 2 diabetes mellitus with diabetic chronic kidney disease: Secondary | ICD-10-CM | POA: Diagnosis present

## 2024-03-12 DIAGNOSIS — N182 Chronic kidney disease, stage 2 (mild): Secondary | ICD-10-CM | POA: Diagnosis present

## 2024-03-12 DIAGNOSIS — Z86711 Personal history of pulmonary embolism: Secondary | ICD-10-CM

## 2024-03-12 DIAGNOSIS — G4733 Obstructive sleep apnea (adult) (pediatric): Secondary | ICD-10-CM | POA: Diagnosis present

## 2024-03-12 DIAGNOSIS — E872 Acidosis, unspecified: Secondary | ICD-10-CM | POA: Diagnosis not present

## 2024-03-12 DIAGNOSIS — Z6841 Body Mass Index (BMI) 40.0 and over, adult: Secondary | ICD-10-CM

## 2024-03-12 DIAGNOSIS — E785 Hyperlipidemia, unspecified: Secondary | ICD-10-CM | POA: Diagnosis present

## 2024-03-12 DIAGNOSIS — J449 Chronic obstructive pulmonary disease, unspecified: Secondary | ICD-10-CM | POA: Diagnosis present

## 2024-03-12 DIAGNOSIS — M19072 Primary osteoarthritis, left ankle and foot: Secondary | ICD-10-CM | POA: Diagnosis not present

## 2024-03-12 DIAGNOSIS — Z79899 Other long term (current) drug therapy: Secondary | ICD-10-CM

## 2024-03-12 DIAGNOSIS — L0390 Cellulitis, unspecified: Secondary | ICD-10-CM | POA: Diagnosis not present

## 2024-03-12 DIAGNOSIS — R600 Localized edema: Secondary | ICD-10-CM | POA: Diagnosis not present

## 2024-03-12 DIAGNOSIS — J4489 Other specified chronic obstructive pulmonary disease: Secondary | ICD-10-CM | POA: Diagnosis present

## 2024-03-12 DIAGNOSIS — R079 Chest pain, unspecified: Secondary | ICD-10-CM | POA: Diagnosis present

## 2024-03-12 DIAGNOSIS — R0789 Other chest pain: Secondary | ICD-10-CM | POA: Diagnosis present

## 2024-03-12 DIAGNOSIS — Z87891 Personal history of nicotine dependence: Secondary | ICD-10-CM | POA: Diagnosis not present

## 2024-03-12 DIAGNOSIS — L03119 Cellulitis of unspecified part of limb: Secondary | ICD-10-CM | POA: Diagnosis present

## 2024-03-12 DIAGNOSIS — I4821 Permanent atrial fibrillation: Secondary | ICD-10-CM | POA: Diagnosis present

## 2024-03-12 DIAGNOSIS — M7989 Other specified soft tissue disorders: Secondary | ICD-10-CM | POA: Diagnosis not present

## 2024-03-12 LAB — BASIC METABOLIC PANEL WITH GFR
Anion gap: 12 (ref 5–15)
BUN: 26 mg/dL — ABNORMAL HIGH (ref 8–23)
CO2: 22 mmol/L (ref 22–32)
Calcium: 8.3 mg/dL — ABNORMAL LOW (ref 8.9–10.3)
Chloride: 100 mmol/L (ref 98–111)
Creatinine, Ser: 1.39 mg/dL — ABNORMAL HIGH (ref 0.61–1.24)
GFR, Estimated: 51 mL/min — ABNORMAL LOW (ref >60)
Glucose, Bld: 192 mg/dL — ABNORMAL HIGH (ref 70–99)
Potassium: 3.5 mmol/L (ref 3.5–5.1)
Sodium: 134 mmol/L — ABNORMAL LOW (ref 135–145)

## 2024-03-12 LAB — HEPATIC FUNCTION PANEL
ALT: 27 U/L (ref 0–44)
AST: 27 U/L (ref 15–41)
Albumin: 3.3 g/dL — ABNORMAL LOW (ref 3.5–5.0)
Alkaline Phosphatase: 62 U/L (ref 38–126)
Bilirubin, Direct: 0.3 mg/dL — ABNORMAL HIGH (ref 0.0–0.2)
Indirect Bilirubin: 1.1 mg/dL — ABNORMAL HIGH (ref 0.3–0.9)
Total Bilirubin: 1.4 mg/dL — ABNORMAL HIGH (ref 0.0–1.2)
Total Protein: 6.5 g/dL (ref 6.5–8.1)

## 2024-03-12 LAB — CBC
HCT: 42.3 % (ref 39.0–52.0)
Hemoglobin: 14.3 g/dL (ref 13.0–17.0)
MCH: 32.8 pg (ref 26.0–34.0)
MCHC: 33.8 g/dL (ref 30.0–36.0)
MCV: 97 fL (ref 80.0–100.0)
Platelets: 213 10*3/uL (ref 150–400)
RBC: 4.36 MIL/uL (ref 4.22–5.81)
RDW: 15.9 % — ABNORMAL HIGH (ref 11.5–15.5)
WBC: 9.6 10*3/uL (ref 4.0–10.5)
nRBC: 0 % (ref 0.0–0.2)

## 2024-03-12 LAB — LACTIC ACID, PLASMA
Lactic Acid, Venous: 2.5 mmol/L (ref 0.5–1.9)
Lactic Acid, Venous: 2 mmol/L (ref 0.5–1.9)

## 2024-03-12 LAB — TROPONIN I (HIGH SENSITIVITY)
Troponin I (High Sensitivity): 4 ng/L (ref <18)
Troponin I (High Sensitivity): 5 ng/L (ref <18)

## 2024-03-12 MED ORDER — ROSUVASTATIN CALCIUM 20 MG PO TABS
40.0000 mg | ORAL_TABLET | Freq: Every day | ORAL | Status: DC
  Administered 2024-03-12 – 2024-03-13 (×2): 40 mg via ORAL
  Filled 2024-03-12 (×2): qty 2

## 2024-03-12 MED ORDER — INSULIN GLARGINE-YFGN 100 UNIT/ML ~~LOC~~ SOLN
6.0000 [IU] | Freq: Every day | SUBCUTANEOUS | Status: DC
  Administered 2024-03-12 – 2024-03-13 (×2): 6 [IU] via SUBCUTANEOUS
  Filled 2024-03-12 (×3): qty 0.06

## 2024-03-12 MED ORDER — MELATONIN 3 MG PO TABS: 6.0000 mg | ORAL_TABLET | Freq: Every evening | ORAL | Status: DC | PRN

## 2024-03-12 MED ORDER — APIXABAN 2.5 MG PO TABS
2.5000 mg | ORAL_TABLET | Freq: Two times a day (BID) | ORAL | Status: DC
  Administered 2024-03-12 – 2024-03-14 (×4): 2.5 mg via ORAL
  Filled 2024-03-12 (×4): qty 1

## 2024-03-12 MED ORDER — ALBUTEROL SULFATE HFA 108 (90 BASE) MCG/ACT IN AERS: 1.0000 | INHALATION_SPRAY | Freq: Four times a day (QID) | RESPIRATORY_TRACT | Status: DC | PRN

## 2024-03-12 MED ORDER — ONDANSETRON HCL 4 MG/2ML IJ SOLN: 4.0000 mg | Freq: Four times a day (QID) | INTRAMUSCULAR | Status: DC | PRN

## 2024-03-12 MED ORDER — FAMOTIDINE 20 MG PO TABS
20.0000 mg | ORAL_TABLET | Freq: Every day | ORAL | Status: DC
  Administered 2024-03-13 – 2024-03-14 (×3): 20 mg via ORAL
  Filled 2024-03-12 (×3): qty 1

## 2024-03-12 MED ORDER — INSULIN ASPART 100 UNIT/ML IJ SOLN
0.0000 [IU] | Freq: Three times a day (TID) | INTRAMUSCULAR | Status: DC
  Administered 2024-03-13 – 2024-03-14 (×4): 1 [IU] via SUBCUTANEOUS

## 2024-03-12 MED ORDER — ACETAMINOPHEN 500 MG PO TABS: 1000.0000 mg | ORAL_TABLET | Freq: Four times a day (QID) | ORAL | Status: DC | PRN

## 2024-03-12 MED ORDER — SPIRONOLACTONE 12.5 MG HALF TABLET
12.5000 mg | ORAL_TABLET | Freq: Every day | ORAL | Status: DC
  Administered 2024-03-13: 12.5 mg via ORAL
  Filled 2024-03-12 (×2): qty 1

## 2024-03-12 MED ORDER — TORSEMIDE 20 MG PO TABS
40.0000 mg | ORAL_TABLET | Freq: Two times a day (BID) | ORAL | Status: DC
  Administered 2024-03-13 (×2): 40 mg via ORAL
  Filled 2024-03-12 (×3): qty 2

## 2024-03-12 MED ORDER — BUDESON-GLYCOPYRROL-FORMOTEROL 160-9-4.8 MCG/ACT IN AERO
2.0000 | INHALATION_SPRAY | Freq: Every day | RESPIRATORY_TRACT | Status: DC
  Administered 2024-03-12: 2 via RESPIRATORY_TRACT
  Filled 2024-03-12: qty 5.9

## 2024-03-12 MED ORDER — ALBUTEROL SULFATE (2.5 MG/3ML) 0.083% IN NEBU: 2.5000 mg | INHALATION_SOLUTION | Freq: Four times a day (QID) | RESPIRATORY_TRACT | Status: DC | PRN

## 2024-03-12 MED ORDER — METOPROLOL TARTRATE 25 MG PO TABS
25.0000 mg | ORAL_TABLET | Freq: Two times a day (BID) | ORAL | Status: DC
  Administered 2024-03-12 – 2024-03-13 (×3): 25 mg via ORAL
  Filled 2024-03-12 (×4): qty 1

## 2024-03-12 MED ORDER — DILTIAZEM HCL ER COATED BEADS 120 MG PO CP24
360.0000 mg | ORAL_CAPSULE | Freq: Every day | ORAL | Status: DC
  Administered 2024-03-13: 360 mg via ORAL
  Filled 2024-03-12 (×2): qty 3

## 2024-03-12 MED ORDER — TAMSULOSIN HCL 0.4 MG PO CAPS
0.4000 mg | ORAL_CAPSULE | Freq: Every day | ORAL | Status: DC
  Administered 2024-03-12 – 2024-03-13 (×2): 0.4 mg via ORAL
  Filled 2024-03-12 (×2): qty 1

## 2024-03-12 MED ORDER — POLYETHYLENE GLYCOL 3350 17 G PO PACK: 17.0000 g | PACK | Freq: Every day | ORAL | Status: DC | PRN

## 2024-03-12 MED ORDER — CLINDAMYCIN PHOSPHATE 600 MG/50ML IV SOLN
600.0000 mg | Freq: Once | INTRAVENOUS | Status: AC
  Administered 2024-03-12: 600 mg via INTRAVENOUS
  Filled 2024-03-12: qty 50

## 2024-03-12 MED ORDER — LACTATED RINGERS IV BOLUS
500.0000 mL | Freq: Once | INTRAVENOUS | Status: AC
  Administered 2024-03-12: 500 mL via INTRAVENOUS

## 2024-03-12 NOTE — ED Provider Notes (Signed)
 [place] EMERGENCY DEPARTMENT AT [facility] Provider Note   CSN: [id number] Arrival date & time: 03/12/24  1950     History  Chief Complaint  Patient presents with   Chest Pain    Vincent Cooper is a 83 y.o. male.  Pt is a 83 yo male with pmhx significant for copd, afib, htn, obesity, PE (on Eliquis ), sleep apnea, DM2, RLS, CAD, gout and CHF.  Pt said he's had left leg redness for a week.  He said it's been getting worse, so he saw his pcp yesterday and was started on oral clinda.  He developed some cp today which occurred after taking abx.         Home Medications Prior to Admission medications   Medication Sig Start Date End Date Taking? Authorizing Provider  albuterol  (VENTOLIN  HFA) 108 (90 Base) MCG/ACT inhaler Inhale 1 puff into the lungs every 6 (six) hours as needed for wheezing or shortness of breath. 05/02/23  Yes Gloriajean Large, MD  apixaban  (ELIQUIS ) 2.5 MG TABS tablet Take 1 tablet (2.5 mg total) by mouth 2 (two) times daily. 09/05/22  Yes Awilda Bogus, MD  budeson-glycopyrrolate -formoterol  (BREZTRI  AEROSPHERE) 160-9-4.8 MCG/ACT AERO inhaler Inhale 2 puffs into the lungs in the morning and at bedtime. 03/02/24  Yes Aleck Hurdle, MD  clindamycin  (CLEOCIN ) 300 MG capsule Take 300 mg by mouth 3 (three) times daily. 03/11/24  Yes [provider]  diltiazem  (CARDIZEM  CD) 360 MG 24 hr capsule Take 1 capsule (360 mg total) by mouth daily. 02/03/23  Yes Justina Oman, MD  hydroxypropyl methylcellulose / hypromellose (ISOPTO TEARS / GONIOVISC) 2.5 % ophthalmic solution Place 1 drop into both eyes as needed for dry eyes.   Yes [provider]  Mepolizumab  (NUCALA ) 100 MG/ML SOAJ Inject 1 mL (100 mg total) into the skin every 28 (twenty-eight) days. 03/02/24  Yes Aleck Hurdle, MD  metFORMIN  (GLUCOPHAGE -XR) 500 MG 24 hr tablet Take 1 tablet (500 mg total) by mouth every morning. 02/18/24  Yes Wendel Hals, NP  metoprolol  tartrate  (LOPRESSOR ) 25 MG tablet Take 1 tablet (25 mg total) by mouth 2 (two) times daily. 06/29/20  Yes Emokpae, Courage, MD  nitroGLYCERIN  (NITROSTAT ) 0.4 MG SL tablet Place 1 tablet (0.4 mg total) under the tongue every 5 (five) minutes as needed for chest pain. 05/28/23  Yes Arleen Lacer, MD  NON FORMULARY Pt uses a cpap nightly   Yes [provider]  potassium chloride  SA (KLOR-CON  M) 20 MEQ tablet Take 20 mEq by mouth 2 (two) times daily. 01/21/23  Yes [provider]  rosuvastatin  (CRESTOR ) 40 MG tablet TAKE 1 TABLET BY MOUTH ONCE DAILY. 12/11/23  Yes Arleen Lacer, MD  spironolactone  (ALDACTONE ) 25 MG tablet Take 0.5 tablets (12.5 mg total) by mouth daily. 02/02/23 03/12/24 Yes Justina Oman, MD  tamsulosin  (FLOMAX ) 0.4 MG CAPS capsule Take 1 capsule (0.4 mg total) by mouth daily. Patient taking differently: Take 0.4 mg by mouth at bedtime. 06/30/20  Yes Colin Dawley, MD  torsemide  (DEMADEX ) 20 MG tablet Take 2 tablets (40 mg total) by mouth 2 (two) times daily. 02/02/23  Yes Justina Oman, MD  ACCU-CHEK GUIDE test strip USE TO TEST BLOOD SUGAREONCE DAILY. 04/02/23   [provider]  Accu-Chek Softclix Lancets lancets daily. 02/11/23   [provider]  Blood Glucose Monitoring Suppl (ACCU-CHEK GUIDE) w/Device KIT USE AS DIRECTEDBh 02/11/23   [provider]  Spacer/Aero-Holding Chambers (AEROCHAMBER MV) inhaler Use as  instructed 04/12/22   Gloriajean Large, MD      Allergies    Ancef  [cefazolin ] and Other    Review of Systems   Review of Systems  Cardiovascular:  Positive for chest pain and leg swelling.  Skin:  Positive for color change.  All other systems reviewed and are negative.   Physical Exam Updated Vital Signs BP 110/84   Pulse 91   Temp 98 F (36.7 C) (Oral)   Resp 20   Ht 5\' 6"  (1.676 m)   Wt 118.8 kg   SpO2 93%   BMI 42.29 kg/m  Physical Exam Vitals and nursing note reviewed.  Constitutional:      Appearance: He is  well-developed. He is obese.  HENT:     Head: Normocephalic and atraumatic.  Eyes:     Extraocular Movements: Extraocular movements intact.     Pupils: Pupils are equal, round, and reactive to light.  Cardiovascular:     Rate and Rhythm: Normal rate. Rhythm irregular.     Heart sounds: Normal heart sounds.  Pulmonary:     Effort: Pulmonary effort is normal.     Breath sounds: Normal breath sounds.  Abdominal:     General: Bowel sounds are normal.     Palpations: Abdomen is soft.  Musculoskeletal:        General: Normal range of motion.     Cervical back: Normal range of motion and neck supple.     Left lower leg: Edema present.  Skin:    General: Skin is warm.     Capillary Refill: Capillary refill takes less than 2 seconds.     Comments: See picture.  LLE redness.  Area of necrosis to medial malleolus.  Lymphangitis.  Neurological:     General: No focal deficit present.     Mental Status: He is alert and oriented to person, place, and time.  Psychiatric:        Mood and Affect: Mood normal.        Behavior: Behavior normal.        ED Results / Procedures / Treatments   Labs (all labs ordered are listed, but only abnormal results are displayed) Labs Reviewed  BASIC METABOLIC PANEL WITH GFR - Abnormal; Notable for the following components:      Result Value   Sodium 134 (*)    Glucose, Bld 192 (*)    BUN 26 (*)    Creatinine, Ser 1.39 (*)    Calcium  8.3 (*)    GFR, Estimated 51 (*)    All other components within normal limits  CBC - Abnormal; Notable for the following components:   RDW 15.9 (*)    All other components within normal limits  LACTIC ACID, PLASMA - Abnormal; Notable for the following components:   Lactic Acid, Venous 2.5 (*)    All other components within normal limits  HEPATIC FUNCTION PANEL - Abnormal; Notable for the following components:   Albumin  3.3 (*)    Total Bilirubin 1.4 (*)    Bilirubin, Direct 0.3 (*)    Indirect Bilirubin 1.1 (*)     All other components within normal limits  CULTURE, BLOOD (ROUTINE X 2)  CULTURE, BLOOD (ROUTINE X 2)  LACTIC ACID, PLASMA  TROPONIN I (HIGH SENSITIVITY)  TROPONIN I (HIGH SENSITIVITY)    EKG EKG Interpretation Date/Time:  Friday March 12 2024 20:00:39 EDT Ventricular Rate:  85 PR Interval:    QRS Duration:  78 QT Interval:  372 QTC  Calculation: 442 R Axis:   108  Text Interpretation: Atrial fibrillation with a competing junctional pacemaker Rightward axis Low voltage QRS Nonspecific ST abnormality Abnormal ECG When compared with ECG of 11-Jan-2024 17:08, Criteria for Septal infarct are no longer Present No significant change since last tracing Confirmed by Sueellen Emery [id number]) on 03/12/2024 8:40:29 PM  Radiology DG Ankle Complete Left Result Date: 03/12/2024 CLINICAL DATA:  Cellulitis EXAM: LEFT ANKLE COMPLETE - 3+ VIEW COMPARISON:  None Available. FINDINGS: Frontal, oblique, and lateral views of the left ankle are obtained. No acute fracture, subluxation, or dislocation. 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 There is diffuse subcutaneous edema throughout the left lower leg, ankle, and hindfoot, consistent with given history of cellulitis. No evidence of subcutaneous gas or radiopaque foreign body. IMPRESSION: 1. Diffuse soft tissue swelling consistent with given history of cellulitis. No subcutaneous gas or radiopaque foreign body. 2. No acute or destructive bony abnormalities. Electronically Signed   By: Bobbye Burrow M.D.   On: 03/12/2024 21:58   DG Chest 2 View Result Date: 03/12/2024 CLINICAL DATA:  Chest pain for 1 hour EXAM: CHEST - 2 VIEW COMPARISON:  01/11/2024 FINDINGS: Frontal and lateral views of the chest demonstrate a stable cardiac silhouette. No acute airspace disease, effusion, or pneumothorax. No acute bony abnormalities. IMPRESSION: 1. No acute intrathoracic process. Electronically Signed   By: Bobbye Burrow M.D.   On: 03/12/2024 20:37     Procedures Procedures    Medications Ordered in ED Medications  clindamycin  (CLEOCIN ) IVPB 600 mg (0 mg Intravenous Stopped 03/12/24 2118)    ED Course/ Medical Decision Making/ A&P                                 Medical Decision Making Amount and/or Complexity of Data Reviewed Labs: ordered. Radiology: ordered.  Risk Prescription drug management. Decision regarding hospitalization.   This patient presents to the ED for concern of cellulitis, this involves an extensive number of treatment options, and is a complaint that carries with it a high risk of complications and morbidity.  The differential diagnosis includes cellulitis, nec fasc, lymphangitis   Co morbidities that complicate the patient evaluation  copd, afib, htn, obesity, PE (on Eliquis ), sleep apnea, DM2, RLS, CAD, gout and CHF   Additional history obtained:  Additional history obtained from epic chart review External records from outside source obtained and reviewed including daughter   Lab Tests:  I Ordered, and personally interpreted labs.  The pertinent results include:  cbc nl, bmp nl other than glucose elevated at 192, cr elevated at 1.39, lfts nl   Imaging Studies ordered:  I ordered imaging studies including cxr and ankle I independently visualized and interpreted imaging which showed CXR: No acute intrathoracic process.  L ankle: Diffuse soft tissue swelling consistent with given history of  cellulitis. No subcutaneous gas or radiopaque foreign body.  2. No acute or destructive bony abnormalities.   I agree with the radiologist interpretation   Cardiac Monitoring:  The patient was maintained on a cardiac monitor.  I personally viewed and interpreted the cardiac monitored which showed an underlying rhythm of: afib   Medicines ordered and prescription drug management:  I ordered medication including clindamycin   for sx  Reevaluation of the patient after these medicines showed that  the patient improved I have reviewed the patients home medicines and have made adjustments as needed   Test Considered:  ct  Critical Interventions:  abx   Consultations Obtained:  I requested consultation with the hospitalist (Dr. Amy Kansky),  and discussed lab and imaging findings as well as pertinent plan - he will admit   Problem List / ED Course:  CP:  atypical.  Likely some pill esophagitis.  Trop and CXR nl.  Continue to trend troponins.  LLE cellulitis:  worsening on oral abx.  Iv clinda given due to cephalosporin allergy.  No gas on xray.     Reevaluation:  After the interventions noted above, I reevaluated the patient and found that they have :improved   Social Determinants of Health:  Lives at home   Dispostion:  After consideration of the diagnostic results and the patients response to treatment, I feel that the patent would benefit from admission.          Final Clinical Impression(s) / ED Diagnoses Final diagnoses:  Cellulitis of left lower extremity  Atypical chest pain    Rx / DC Orders ED Discharge Orders     None         Sueellen Emery, MD 03/12/24 2218

## 2024-03-12 NOTE — ED Triage Notes (Signed)
 Pt stated that he began having chest pain that feels like indigestion an hour ago and is also fighting a case of cellulitis on his left leg. Pt recently prescribed clidamycin

## 2024-03-12 NOTE — ED Notes (Signed)
 Pt said chest pain started after taking his antibiotic

## 2024-03-12 NOTE — H&P (Signed)
 History and Physical    [name] AVW:[id number] DOB: 06-17-1941 DOA: 03/12/2024  PCP: Orest Bio, MD   Patient coming from: Home   Chief Complaint:  Chief Complaint  Patient presents with   Chest Pain    HPI:  Vincent Cooper is a 83 y.o. 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  Reports 1.5 weeks ago he developed a clear vesicle on distal LLE which ultimately deroofed and was draining clear serous fluid. Within past few days has had worsening redness which is spread up his leg.  Does have some purulent drainage from area of vesicle.  He is not exactly clear when appearance of the deroofed vesicles became darker but thinks they are healing.  No fevers, chills.  Mild pain associated.  Saw his primary care physician yesterday and prescribed clindamycin .  After taking a dose of clindamycin  he had an episode of chest pain which was not exertional.  Denies any other associated symptoms.   Review of Systems:  ROS complete and negative except as marked above   Allergies  Allergen Reactions   Ancef  [Cefazolin ] Itching and Rash   Other Other (See Comments)    Patient reports he was allergic to something in an IV he was given but does not know what the substance was. As of 01/19/2020     Prior to Admission medications   Medication Sig Start Date End Date Taking? Authorizing Provider  albuterol  (VENTOLIN  HFA) 108 (90 Base) MCG/ACT inhaler Inhale 1 puff into the lungs every 6 (six) hours as needed for wheezing or shortness of breath. 05/02/23  Yes Gloriajean Large, MD  apixaban  (ELIQUIS ) 2.5 MG TABS tablet Take 1 tablet (2.5 mg total) by mouth 2 (two) times daily. 09/05/22  Yes Awilda Bogus, MD  budeson-glycopyrrolate -formoterol  (BREZTRI  AEROSPHERE) 160-9-4.8 MCG/ACT AERO inhaler Inhale  2 puffs into the lungs in the morning and at bedtime. 03/02/24  Yes Aleck Hurdle, MD  clindamycin  (CLEOCIN ) 300 MG capsule Take 300 mg by mouth 3 (three) times daily. 03/11/24  Yes [provider]  diltiazem  (CARDIZEM  CD) 360 MG 24 hr capsule Take 1 capsule (360 mg total) by mouth daily. 02/03/23  Yes Justina Oman, MD  hydroxypropyl methylcellulose / hypromellose (ISOPTO TEARS / GONIOVISC) 2.5 % ophthalmic solution Place 1 drop into both eyes as needed for dry eyes.   Yes [provider]  Mepolizumab  (NUCALA ) 100 MG/ML SOAJ Inject 1 mL (100 mg total) into the skin every 28 (twenty-eight) days. 03/02/24  Yes Aleck Hurdle, MD  metFORMIN  (GLUCOPHAGE -XR) 500 MG 24 hr tablet Take 1 tablet (500 mg total) by mouth every morning. 02/18/24  Yes Wendel Hals, NP  metoprolol  tartrate (LOPRESSOR ) 25 MG tablet Take 1 tablet (25 mg total) by mouth 2 (two) times daily. 06/29/20  Yes Emokpae, Courage, MD  nitroGLYCERIN  (NITROSTAT ) 0.4 MG SL tablet Place 1 tablet (0.4 mg total) under the tongue every 5 (five) minutes as needed for chest pain. 05/28/23  Yes Arleen Lacer, MD  NON FORMULARY Pt uses a cpap nightly   Yes [provider]  potassium chloride  SA (KLOR-CON  M) 20 MEQ tablet Take 20 mEq by mouth 2 (two) times daily. 01/21/23  Yes [provider]  rosuvastatin  (CRESTOR ) 40 MG tablet TAKE 1 TABLET BY MOUTH ONCE DAILY. 12/11/23  Yes Randene Bustard  W, MD  spironolactone  (ALDACTONE ) 25 MG tablet Take 0.5 tablets (12.5 mg total) by mouth daily. 02/02/23 03/12/24 Yes Justina Oman, MD  tamsulosin  (FLOMAX ) 0.4 MG CAPS capsule Take 1 capsule (0.4 mg total) by mouth daily. Patient taking differently: Take 0.4 mg by mouth at bedtime. 06/30/20  Yes Emokpae, Courage, MD  torsemide  (DEMADEX ) 20 MG tablet Take 2 tablets (40 mg total) by mouth 2 (two) times daily. 02/02/23  Yes Justina Oman, MD  ACCU-CHEK GUIDE test strip USE TO TEST BLOOD SUGAREONCE DAILY. 04/02/23   [provider]  Accu-Chek Softclix Lancets lancets daily. 02/11/23   [provider]  Blood Glucose Monitoring Suppl (ACCU-CHEK GUIDE) w/Device KIT USE AS DIRECTEDBh 02/11/23   [provider]  Spacer/Aero-Holding Chambers (AEROCHAMBER MV) inhaler Use as instructed 04/12/22   Gloriajean Large, MD    Past Medical History:  Diagnosis Date   CHF (congestive heart failure) (HCC)    Chronic gout    COPD (chronic obstructive pulmonary disease) (HCC)    Oxygen  dependent   Coronary artery disease due to calcified coronary lesion 03/08/2020   CORONARY CTA: Cor Ca++ Score 1651 !!: ~ 50% LM (CTFFR 1 - NS). Large Dom RCA-<PDA-PAV/PL - diffuse mild-mod plaque: prox (25-49%), mid (50-69%) CTFFR (p 0.99, m 0.85, d 0.81 - NS).  Med-Size LAD - prox-mid long diffuse Mod-Severe Ca++ plaque (~50-69%, ?>70%): CTFFR p 0.95, m 0.88, d 0.86 -NS.  CTFFR LCx 0.94, OM1 0.90 - NS. (NS=Not Significant).   Diabetes mellitus type II, non insulin  dependent (HCC)    Hypertension    Morbid obesity (HCC)    BMI of 38.5 with multiple risk factors.   OSA on CPAP    Pulmonary emboli (HCC) 10/2019   Chest CTA-4 Phadke opacification of main PA but there is partially occlusive main posterior RLL and segmental/segmental branches.  Small thrombus noted in the anterior right middle lobe.  No RV strain.  Scattered aortic atherosclerosis involving great vessels.  Coronary calcification noted.   RLS (restless legs syndrome)     Past Surgical History:  Procedure Laterality Date   BOWEL RESECTION N/A 10/18/2020   Procedure: SMALL BOWEL RESECTION;  Surgeon: Awilda Bogus, MD;  Location: [facility];  Service: General;  Laterality: N/A;   Cataract surgery Right    COLONOSCOPY WITH PROPOFOL  N/A 09/27/2020   Procedure: COLONOSCOPY WITH PROPOFOL ;  Surgeon: Ruby Corporal, MD;  Location: [facility];  Service: Endoscopy;  Laterality: N/A;  1:15   COLOSTOMY REVERSAL N/A 10/18/2020   Procedure: COLOSTOMY REVERSAL;   Surgeon: Awilda Bogus, MD;  Location: [facility];  Service: General;  Laterality: N/A;   LAPAROTOMY N/A 06/21/2020   Procedure: EXPLORATORY LAPAROTOMY,  bowel resection, creation ostomy;  Surgeon: Awilda Bogus, MD;  Location: [facility];  Service: General;  Laterality: N/A;   POLYPECTOMY  09/27/2020   Procedure: POLYPECTOMY;  Surgeon: Ruby Corporal, MD;  Location: [facility];  Service: Endoscopy;;   TRANSTHORACIC ECHOCARDIOGRAM  01/31/2023   Normal LV size and function with EF 65 to 70%.  No RWMA.  Unable to assess diastolic function.  Normal RV size and function.  Normal RVP and mildly elevated RAP.  Mild LA dilation and moderate RA dilation.  Moderate AoV calcification with mild AAS (mean AVG 13 mmHg). => Mild AS now present   VENTRAL HERNIA REPAIR N/A 09/02/2022   Procedure: HERNIA REPAIR VENTRAL ADULT WITH  MESH;  Surgeon: Awilda Bogus, MD;  Location: [facility];  Service:  General;  Laterality: N/A;     reports that he quit smoking about 22 years ago. His smoking use included cigarettes. He started smoking about 77 years ago. He has a 110 pack-year smoking history. He has quit using smokeless tobacco. He reports current alcohol  use. He reports that he does not use drugs.  Family History  Problem Relation Age of Onset   Colon cancer Mother    Emphysema Father        smoked   Diverticulitis Sister    Diabetes Brother    Diabetes Brother    Heart Problems Maternal Grandmother    Breast cancer Daughter    Lupus Son    CAD Neg Hx      Physical Exam: Vitals:   03/12/24 2115 03/12/24 2130 03/12/24 2200 03/12/24 2201  BP: 124/69 130/64 110/84   Pulse: 86 76 85 91  Resp: 17 14  20   Temp:      TempSrc:      SpO2: 95% 95% 92% 93%  Weight:      Height:        Gen: Awake, alert, chronically ill-appearing CV: Regular, normal S1, S2, 1/6 SEM, left DP 2+ Resp: Normal WOB, coarse but otherwise clear Abd: Obese, normoactive, nontender MSK: Asymmetric edema, left lower  extremity with 4+ edema in the foot which tapers off to 2+ edema extending to the knee.  No significant edema right lower extremity Skin: See pictures in media tab.  There is erythema and mild warmth extending two thirds of the lower leg.  There is a pustule which has been partially deroofed and draining small amount of purulent fluid.  There are angular areas of what appears to be granulation tissue and dried blood, and an area that had been scraped reveals healthy appearing pink underlying tissue.  No definite necrotic tissue.  Neuro: Alert and interactive  Psych: euthymic, appropriate    Data review:   Labs reviewed, notable for:   Lactate 2.5 Blood glucose 192 T. bili 1.4, conjugated 1.1, other LFT normal High-sensitivity troponin negative  Micro:  Results for orders placed or performed during the hospital encounter of 03/12/24  Culture, blood (routine x 2)     Status: None (Preliminary result)   Collection Time: 03/12/24  8:16 PM   Specimen: Right Antecubital; Blood  Result Value Ref Range Status   Specimen Description RIGHT ANTECUBITAL  Final   Special Requests   Final    BOTTLES DRAWN AEROBIC AND ANAEROBIC Blood Culture adequate volume Performed at [facility], [address] [place], [place] [address]    Culture PENDING  Incomplete   Report Status PENDING  Incomplete  Culture, blood (routine x 2)     Status: None (Preliminary result)   Collection Time: 03/12/24  8:46 PM   Specimen: BLOOD RIGHT ARM  Result Value Ref Range Status   Specimen Description BLOOD RIGHT ARM  Final   Special Requests   Final    BOTTLES DRAWN AEROBIC AND ANAEROBIC Blood Culture adequate volume Performed at [facility], [address], [place], [place] [address]    Culture PENDING  Incomplete   Report Status PENDING  Incomplete    Imaging reviewed:  DG Ankle Complete Left Result Date: 03/12/2024 CLINICAL DATA:  Cellulitis EXAM: LEFT ANKLE COMPLETE - 3+ VIEW COMPARISON:  None Available.  FINDINGS: Frontal, oblique, and lateral views of the left ankle are obtained. No acute fracture, subluxation, or dislocation. 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 There is diffuse subcutaneous edema  throughout the left lower leg, ankle, and hindfoot, consistent with given history of cellulitis. No evidence of subcutaneous gas or radiopaque foreign body. IMPRESSION: 1. Diffuse soft tissue swelling consistent with given history of cellulitis. No subcutaneous gas or radiopaque foreign body. 2. No acute or destructive bony abnormalities. Electronically Signed   By: Bobbye Burrow M.D.   On: 03/12/2024 21:58   DG Chest 2 View Result Date: 03/12/2024 CLINICAL DATA:  Chest pain for 1 hour EXAM: CHEST - 2 VIEW COMPARISON:  01/11/2024 FINDINGS: Frontal and lateral views of the chest demonstrate a stable cardiac silhouette. No acute airspace disease, effusion, or pneumothorax. No acute bony abnormalities. IMPRESSION: 1. No acute intrathoracic process. Electronically Signed   By: Bobbye Burrow M.D.   On: 03/12/2024 20:37    EKG:  Personally reviewed, A-fib, RAD, low voltage QRS, no acute ischemic changes.  ED Course:  Treated with clindamycin  600 mg IV x 1   Assessment/Plan:  83 y.o. 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  Purulent cellulitis, left lower extremity Asymmetric LLE edema 1.5 weeks of vesicles draining clear fluid, likely associated with underlying edema.  There are angular areas which appear to be from deroofed prior vesicles containing granulation tissue and dried blood.  Do not feel these are truly necrotic areas.  Cellulitis extends up two thirds of the lower leg.  Had seen PCP in taken dose of clindamycin  outpatient.  Afebrile.  WBC 9.  Lactate is 2.5.  X-ray of the left ankle with no gas  or bony involvement. - S/p clindamycin  600 mg IV x 1 in the ED.  Switch to vancomycin , add Zosyn  for broader initial coverage. - Close monitoring for extension of the angular areas, or any signs of possible necrotizing infection - Check ESR, CRP - Duplex left lower extremity rule out DVT - Elevate left lower extremity  Lactic acidosis - Give 500 cc IV fluid, repeat lactate  Chest pain, noncardiac Episode of nonexertional chest pain without associated symptoms.  EKG without ischemic changes.  High-sensitivity troponin negative.  Suspect possible GI source. - Trial famotidine   Isolated elevation total bilirubin -Trend LFT  Chronic medical problems: HFpEF: Without acute exacerbation, resume home diuretic torsemide  40 mg twice daily tomorrow Permanent A-fib: Continue home Eliquis , diltiazem  3060 mg daily, metoprolol  25 mg twice daily History of PE: On DOAC per above Nonobstructive CAD: Continue home rosuvastatin  40 mg, DOAC per above. Hypertension: On diltiazem , metoprolol  per above.  Continue home spironolactone  as well Hyperlipidemia: Continue home statin per above Diabetes type 2: Home regimen metformin . prefer tighter glycemic control initially in the hospital in setting of underlying cellulitis.  Started on conservative dosed Semglee  6 units nightly and SSI for very sensitive.  Check A1c. CKD stage II: Baseline creatinine near 1.2. COPD/asthma: Continue Breztri  inhaler, DuoNeb as needed.  On mepolizumab  outpatient. OSA: CPAP nightly Morbid obesity: Would benefit from continued weight loss outpatient. BPH: Continue home tamsulosin .  Body mass index is 42.29 kg/m.    DVT prophylaxis:  Eliquis  Code Status:  Full Code Diet:  Diet Orders (From admission, onward)     Start     Ordered   03/12/24 2212  Diet Carb Modified Fluid consistency: Thin; Room service appropriate? Yes  Diet effective now       Question Answer Comment  Diet-HS Snack? Nothing   Calorie Level Medium 1600-2000  Fluid consistency: Thin   Room service appropriate? Yes      03/12/24 2219           Family Communication:  No   Consults:  None   Admission status:   Inpatient, Med-Surg  Severity of Illness: The appropriate patient status for this patient is INPATIENT. Inpatient status is judged to be reasonable and necessary in order to provide the required intensity of service to ensure the patient's safety. The patient's presenting symptoms, physical exam findings, and initial radiographic and laboratory data in the context of their chronic comorbidities is felt to place them at high risk for further clinical deterioration. Furthermore, it is not anticipated that the patient will be medically stable for discharge from the hospital within 2 midnights of admission.   * I certify that at the point of admission it is my clinical judgment that the patient will require inpatient hospital care spanning beyond 2 midnights from the point of admission due to high intensity of service, high risk for further deterioration and high frequency of surveillance required.*   Arnulfo Larch, MD [facility]  How to contact the [facility] Attending or Consulting provider 7A - 7P or covering provider during after hours 7P -7A, for this patient.  Check the care team in [facility] and look for a) attending/consulting [facility] provider listed and b) the [facility] team listed Log into [email or web address] and use [place]'s universal password to access. If you do not have the password, please contact the hospital operator. Locate the [facility] provider you are looking for under [facility] and page to a number that you can be directly reached. If you still have difficulty reaching the provider, please page the [facility] (Director on Call) for the Hospitalists listed on amion for assistance.  03/12/2024, 10:21 PM

## 2024-03-12 NOTE — ED Notes (Signed)
 Lactic pulled and one set of cultures.

## 2024-03-13 ENCOUNTER — Inpatient Hospital Stay (HOSPITAL_COMMUNITY)

## 2024-03-13 DIAGNOSIS — R079 Chest pain, unspecified: Secondary | ICD-10-CM

## 2024-03-13 DIAGNOSIS — L03116 Cellulitis of left lower limb: Secondary | ICD-10-CM | POA: Diagnosis not present

## 2024-03-13 DIAGNOSIS — E872 Acidosis, unspecified: Secondary | ICD-10-CM | POA: Diagnosis not present

## 2024-03-13 LAB — CBC
HCT: 38 % — ABNORMAL LOW (ref 39.0–52.0)
Hemoglobin: 12.7 g/dL — ABNORMAL LOW (ref 13.0–17.0)
MCH: 32.6 pg (ref 26.0–34.0)
MCHC: 33.4 g/dL (ref 30.0–36.0)
MCV: 97.4 fL (ref 80.0–100.0)
Platelets: 194 10*3/uL (ref 150–400)
RBC: 3.9 MIL/uL — ABNORMAL LOW (ref 4.22–5.81)
RDW: 16.1 % — ABNORMAL HIGH (ref 11.5–15.5)
WBC: 7.3 10*3/uL (ref 4.0–10.5)
nRBC: 0 % (ref 0.0–0.2)

## 2024-03-13 LAB — BASIC METABOLIC PANEL WITH GFR
Anion gap: 8 (ref 5–15)
BUN: 24 mg/dL — ABNORMAL HIGH (ref 8–23)
CO2: 24 mmol/L (ref 22–32)
Calcium: 7.9 mg/dL — ABNORMAL LOW (ref 8.9–10.3)
Chloride: 102 mmol/L (ref 98–111)
Creatinine, Ser: 1.17 mg/dL (ref 0.61–1.24)
GFR, Estimated: >60 mL/min (ref >60)
Glucose, Bld: 161 mg/dL — ABNORMAL HIGH (ref 70–99)
Potassium: 3.4 mmol/L — ABNORMAL LOW (ref 3.5–5.1)
Sodium: 134 mmol/L — ABNORMAL LOW (ref 135–145)

## 2024-03-13 LAB — C-REACTIVE PROTEIN: CRP: 0.6 mg/dL (ref <1.0)

## 2024-03-13 LAB — HEMOGLOBIN A1C
Hgb A1c MFr Bld: 5.4 % (ref 4.8–5.6)
Mean Plasma Glucose: 108.28 mg/dL

## 2024-03-13 LAB — GLUCOSE, CAPILLARY
Glucose-Capillary: 151 mg/dL — ABNORMAL HIGH (ref 70–99)
Glucose-Capillary: 156 mg/dL — ABNORMAL HIGH (ref 70–99)
Glucose-Capillary: 168 mg/dL — ABNORMAL HIGH (ref 70–99)
Glucose-Capillary: 128 mg/dL — ABNORMAL HIGH (ref 70–99)

## 2024-03-13 LAB — LACTIC ACID, PLASMA: Lactic Acid, Venous: 1.7 mmol/L (ref 0.5–1.9)

## 2024-03-13 LAB — BRAIN NATRIURETIC PEPTIDE: B Natriuretic Peptide: 113 pg/mL — ABNORMAL HIGH (ref 0.0–100.0)

## 2024-03-13 LAB — SEDIMENTATION RATE: Sed Rate: 25 mm/h — ABNORMAL HIGH (ref 0–16)

## 2024-03-13 LAB — MAGNESIUM: Magnesium: 2 mg/dL (ref 1.7–2.4)

## 2024-03-13 LAB — PHOSPHORUS: Phosphorus: 3 mg/dL (ref 2.5–4.6)

## 2024-03-13 MED ORDER — POTASSIUM CHLORIDE CRYS ER 20 MEQ PO TBCR
40.0000 meq | EXTENDED_RELEASE_TABLET | Freq: Two times a day (BID) | ORAL | Status: DC
  Administered 2024-03-13 – 2024-03-14 (×3): 40 meq via ORAL
  Filled 2024-03-13 (×3): qty 2

## 2024-03-13 MED ORDER — BUDESON-GLYCOPYRROL-FORMOTEROL 160-9-4.8 MCG/ACT IN AERO
2.0000 | INHALATION_SPRAY | Freq: Two times a day (BID) | RESPIRATORY_TRACT | Status: DC
  Administered 2024-03-13 – 2024-03-14 (×3): 2 via RESPIRATORY_TRACT
  Filled 2024-03-13: qty 5.9

## 2024-03-13 MED ORDER — VANCOMYCIN HCL 1500 MG/300ML IV SOLN
1500.0000 mg | INTRAVENOUS | Status: DC
  Administered 2024-03-14: 1500 mg via INTRAVENOUS
  Filled 2024-03-13: qty 300

## 2024-03-13 MED ORDER — VANCOMYCIN HCL 2000 MG/400ML IV SOLN
2000.0000 mg | Freq: Once | INTRAVENOUS | Status: AC
  Administered 2024-03-13: 2000 mg via INTRAVENOUS
  Filled 2024-03-13: qty 400

## 2024-03-13 NOTE — Progress Notes (Signed)
 Pharmacy Antibiotic Note  Vincent Cooper is a 83 y.o. male admitted on 03/12/2024 with cellulitis.  Pharmacy has been consulted for Vancomycin  dosing. left leg redness for a week. (Developed chest pain after taking oral clindamycin  4/25)  Plan: Vancomycin  2000mg  IV loading dose, then 1500 mg IV Q 24 hrs. Goal AUC 400-550. Expected AUC: 479 SCr used: 1.12   Height: 5\' 6"  (167.6 cm) Weight: 118 kg (260 lb 2.3 oz) IBW/kg (Calculated) : 63.8  Temp (24hrs), Avg:98 F (36.7 C), Min:97.7 F (36.5 C), Max:98.4 F (36.9 C)  Recent Labs  Lab 03/12/24 2016 03/12/24 2017 03/12/24 2206 03/13/24 0016 03/13/24 0422  WBC  --  9.6  --   --  7.3  CREATININE  --  1.39*  --   --  1.17  LATICACIDVEN 2.5*  --  2.0* 1.7  --     Estimated Creatinine Clearance: 58.9 mL/min (by C-G formula based on SCr of 1.17 mg/dL).    Allergies  Allergen Reactions   Ancef  [Cefazolin ] Itching and Rash   Other Other (See Comments)    Patient reports he was allergic to something in an IV he was given but does not know what the substance was. As of 01/19/2020     Antimicrobials this admission: vancomycin  4/26 >>   Microbiology results: 4/25 BCx: pending  Thank you for allowing pharmacy to be a part of this patient's care.  Yunuen Mordan, BS Pharm D, BCPS Clinical Pharmacist 03/13/2024 9:19 AM

## 2024-03-13 NOTE — Progress Notes (Signed)
 Mobility Specialist Progress Note:    03/13/24 1110  Mobility  Activity Ambulated with assistance in hallway  Level of Assistance Modified independent, requires aide device or extra time  Assistive Device [facility] Ambulated (ft) 140 ft  Range of Motion/Exercises Active;All extremities  Activity Response Tolerated well  Mobility Referral Yes  Mobility visit 1 Mobility  Mobility Specialist Start Time (ACUTE ONLY) 1110  Mobility Specialist Stop Time (ACUTE ONLY) 1130  Mobility Specialist Time Calculation (min) (ACUTE ONLY) 20 min   Pt received in chair, sister in room. Agreeable to mobility, ModI to stand and ambulate with straight cane. Tolerated well,asx throughout. Returned pt to chair, alarm on. Call bell in reach, all needs met.  Marius Betts Mobility Specialist Please contact via Special educational needs teacher or  Rehab office at [phone]

## 2024-03-13 NOTE — TOC Initial Note (Signed)
 Transition of Care [facility]) - Initial/Assessment Note    Patient Details  Name: Vincent Cooper MRN: [id number] Date of Birth: 11-03-1941  [facility] CM/SW Contact:    Lynda Sands, RN Phone Number: 03/13/2024, 1:30 PM  Clinical Narrative:    Patient admitted with cellulitis of lower extremity.CM met with patient at bedside and his sister at bedside. Patient lives alone. Patient support from his sister. Patient is independent with adl's. Patient ambulates with cane, has rollator and bedside commode. Patient does not have home services currently. Patient drives himself to appointments               Expected Discharge Plan: Home w Home Health Services Barriers to Discharge: Continued Medical Work up   Patient Goals and CMS Choice Patient states their goals for this hospitalization and ongoing recovery are:: Discharge home       Living arrangements for the past 2 months: Single Family Home    Prior Living Arrangements/Services Living arrangements for the past 2 months: Single Family Home Lives with:: Self Patient language and need for interpreter reviewed:: No Do you feel safe going back to the place where you live?: Yes      Need for Family Participation in Patient Care: Yes (Comment) Care giver support system in place?: Yes (comment)   Criminal Activity/Legal Involvement Pertinent to Current Situation/Hospitalization: Yes - Comment as needed  Activities of Daily Living   ADL Screening (condition at time of admission) Independently performs ADLs?: Yes (appropriate for developmental age) Is the patient deaf or have difficulty hearing?: Yes Does the patient have difficulty seeing, even when wearing glasses/contacts?: No Does the patient have difficulty concentrating, remembering, or making decisions?: No  Permission Sought/Granted Permission sought to share information with : Case Manager         Emotional Assessment Appearance:: Appears stated  age Attitude/Demeanor/Rapport: Engaged Affect (typically observed): Stable Orientation: : Oriented to Self, Oriented to Place, Oriented to  Time, Oriented to Situation      Admission diagnosis:  Atypical chest pain [R07.89] Cellulitis of left lower extremity [L03.116] Cellulitis of lower extremity [L03.119] Patient Active Problem List   Diagnosis Date Noted   Cellulitis of lower extremity 03/12/2024   Chest pain 03/12/2024   Lactic acidosis 03/12/2024   Acute on chronic diastolic HF (heart failure) (HCC) 01/30/2023   SBO (small bowel obstruction) (HCC)    Ventral hernia without obstruction or gangrene    Permanent atrial fibrillation (HCC): CHA2DS2Vasc = 5. 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   Bowel perforation (HCC) 06/21/2020   Free intraperitoneal air    Cellulitis, leg 06/17/2020   AKI (acute kidney injury) (HCC) 06/17/2020   Coronary artery disease, non-occlusive 03/20/2020   Hyperlipidemia associated with type 2 diabetes mellitus (HCC) 03/20/2020   Acute respiratory failure with hypoxia (HCC)    Bronchiectasis with acute exacerbation (HCC)    Chronic diastolic HF (heart failure) (HCC)    Shortness of breath 01/19/2020   Morbid obesity (HCC) 01/19/2020   COPD with acute exacerbation (HCC) 01/19/2020   Cardiac murmur 11/07/2019   COPD (chronic obstructive pulmonary disease) (HCC) 11/07/2019   Diabetes mellitus type II, non insulin  dependent (HCC) 11/07/2019   OSA and COPD overlap syndrome (HCC) 11/07/2019   Essential hypertension 11/07/2019  Single subsegmental pulmonary embolism without acute cor pulmonale (HCC) 11/06/2019   PCP:  Orest Bio, MD Pharmacy:   [facility] - [place], [place] - [address] [place] [address] Phone: [phone] Fax: [phone]     Social Drivers of Health (SDOH) Social History: SDOH Screenings   Food Insecurity: No Food Insecurity (03/12/2024)  Housing: Low Risk  (03/12/2024)  Transportation Needs: No Transportation Needs (03/12/2024)  Utilities: Not At Risk (03/12/2024)  Depression (PHQ2-9): Low Risk  (06/03/2022)  Social Connections: Socially Isolated (03/12/2024)  Tobacco Use: Medium Risk (03/12/2024)   SDOH Interventions:     Readmission Risk Interventions     No data to display

## 2024-03-13 NOTE — Hospital Course (Signed)
 83 y.o. 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   Reports 1.5 weeks ago he developed a clear vesicle on distal LLE which ultimately deroofed and was draining clear serous fluid. Within past few days has had worsening redness which is spread up his leg.  Does have some purulent drainage from area of vesicle.  He is not exactly clear when appearance of the deroofed vesicles became darker but thinks they are healing.  No fevers, chills.  Mild pain associated.   Saw his primary care physician yesterday and prescribed clindamycin .  After taking a dose of clindamycin  he had an episode of chest pain which was not exertional.  Denies any other associated symptoms.

## 2024-03-13 NOTE — Plan of Care (Signed)
  Problem: Education: Goal: Knowledge of General Education information will improve Description: Including pain rating scale, medication(s)/side effects and non-pharmacologic comfort measures Outcome: Progressing   Problem: Health Behavior/Discharge Planning: Goal: Ability to manage health-related needs will improve Outcome: Progressing   Problem: Clinical Measurements: Goal: Ability to maintain clinical measurements within normal limits will improve Outcome: Progressing Goal: Will remain free from infection Outcome: Progressing Goal: Diagnostic test results will improve Outcome: Progressing Goal: Respiratory complications will improve Outcome: Progressing Goal: Cardiovascular complication will be avoided Outcome: Progressing   Problem: Activity: Goal: Risk for activity intolerance will decrease Outcome: Progressing   Problem: Nutrition: Goal: Adequate nutrition will be maintained Outcome: Progressing   Problem: Coping: Goal: Level of anxiety will decrease Outcome: Progressing   Problem: Elimination: Goal: Will not experience complications related to bowel motility Outcome: Progressing Goal: Will not experience complications related to urinary retention Outcome: Progressing   Problem: Pain Managment: Goal: General experience of comfort will improve and/or be controlled Outcome: Progressing   Problem: Safety: Goal: Ability to remain free from injury will improve Outcome: Progressing   Problem: Skin Integrity: Goal: Risk for impaired skin integrity will decrease Outcome: Progressing   Problem: Education: Goal: Ability to describe self-care measures that may prevent or decrease complications (Diabetes Survival Skills Education) will improve Outcome: Progressing Goal: Individualized Educational Video(s) Outcome: Progressing   Problem: Coping: Goal: Ability to adjust to condition or change in health will improve Outcome: Progressing   Problem: Fluid  Volume: Goal: Ability to maintain a balanced intake and output will improve Outcome: Progressing   Problem: Health Behavior/Discharge Planning: Goal: Ability to identify and utilize available resources and services will improve Outcome: Progressing Goal: Ability to manage health-related needs will improve Outcome: Progressing   Problem: Metabolic: Goal: Ability to maintain appropriate glucose levels will improve Outcome: Progressing   Problem: Nutritional: Goal: Maintenance of adequate nutrition will improve Outcome: Progressing Goal: Progress toward achieving an optimal weight will improve Outcome: Progressing   Problem: Skin Integrity: Goal: Risk for impaired skin integrity will decrease Outcome: Progressing   Problem: Tissue Perfusion: Goal: Adequacy of tissue perfusion will improve Outcome: Progressing   Problem: Clinical Measurements: Goal: Ability to avoid or minimize complications of infection will improve Outcome: Progressing   Problem: Skin Integrity: Goal: Skin integrity will improve Outcome: Progressing

## 2024-03-13 NOTE — Progress Notes (Signed)
 PROGRESS NOTE   Vincent Cooper  ZOX:[id number] DOB: 28-Feb-1941 DOA: 03/12/2024 PCP: Orest Bio, MD   Chief Complaint  Patient presents with   Chest Pain   Level of care: Med-Surg  Brief Admission History:  83 y.o. 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   Reports 1.5 weeks ago he developed a clear vesicle on distal LLE which ultimately deroofed and was draining clear serous fluid. Within past few days has had worsening redness which is spread up his leg.  Does have some purulent drainage from area of vesicle.  He is not exactly clear when appearance of the deroofed vesicles became darker but thinks they are healing.  No fevers, chills.  Mild pain associated.   Saw his primary care physician yesterday and prescribed clindamycin .  After taking a dose of clindamycin  he had an episode of chest pain which was not exertional.  Denies any other associated symptoms.    Assessment and Plan:  Cellulitis of LLE - given purulence we have started him on IV vancomycin  - I have consulted pharm D to dose IV vancomycin  - continue elevation of the LLE - continue supportive measures  Lactic acidosis - treated with IV fluid hydration in ED  Atypical CP - symptoms resolved now - suspect was Upper GI related - treated with famotidine   Chronic HFpEF - resume home diuretics - appears compensated at this time  History of PE Acquired thrombophilia - resumed home apixaban   Permanent Atrial Fibrillation  - resumed home metoprolol  and diltiazem  for rate control management - resumed apixaban  for full dose anticoagulation   Essential hypertension  - resumed home medication   CAD - resumed home rosuvastatin  and metoprolol    Hyperlipidemia - resume home rosuvastatin    OSA  - night CPAP  offered  COPD - stable on home bronchodilators  BPH - resumed home tamsulosin   Type 2  DM with renal complications - monitor CBG closely  - SSI coverage - low dose semglee  ordered - A1c pending CBG (last 3)  Recent Labs    03/13/24 0715 03/13/24 1106  GLUCAP 151* 156*   DVT prophylaxis: apixaban   Code Status: Full  Family Communication:  Disposition: anticipating home    Consultants:   Procedures:   Antimicrobials:  Vancomycin  IV 4/25>>   Subjective: Pt reports he has been elevating his left leg and it is less painful and swollen. Pt denies that lesions have been painful  Objective: Vitals:   03/13/24 0354 03/13/24 0741 03/13/24 0845 03/13/24 1155  BP: (!) 102/55  (!) 115/58 (!) 117/57  Pulse: 67  76 70  Resp: 16  20   Temp: 98.4 F (36.9 C)  98 F (36.7 C) 97.9 F (36.6 C)  TempSrc: Oral  Oral Oral  SpO2: 95% 98% 99% 98%  Weight:      Height:        Intake/Output Summary (Last 24 hours) at 03/13/2024 1336 Last data filed at 03/13/2024 1156 Gross per 24 hour  Intake 1703.1 ml  Output --  Net 1703.1 ml   Filed Weights   03/12/24 1957 03/12/24 2342  Weight: 118.8 kg 118 kg   Examination:  General exam: Appears calm and comfortable  Respiratory system: Clear to auscultation. Respiratory effort normal. Cardiovascular system: normal S1 & S2 heard. No JVD, murmurs, rubs, gallops or clicks. No pedal  edema. Gastrointestinal system: Abdomen is nondistended, soft and nontender. No organomegaly or masses felt. Normal bowel sounds heard. Central nervous system: Alert and oriented. No focal neurological deficits. Extremities: Symmetric 5 x 5 power. 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  Psychiatry: Judgement and insight appear normal. Mood & affect appropriate.   Data Reviewed: I have personally reviewed following labs and imaging studies  CBC: Recent Labs  Lab 03/12/24 2017  03/13/24 0422  WBC 9.6 7.3  HGB 14.3 12.7*  HCT 42.3 38.0*  MCV 97.0 97.4  PLT 213 194    Basic Metabolic Panel: Recent Labs  Lab 03/12/24 2017 03/13/24 0422  NA 134* 134*  K 3.5 3.4*  CL 100 102  CO2 22 24  GLUCOSE 192* 161*  BUN 26* 24*  CREATININE 1.39* 1.17  CALCIUM  8.3* 7.9*  MG  --  2.0  PHOS  --  3.0    CBG: Recent Labs  Lab 03/13/24 0715 03/13/24 1106  GLUCAP 151* 156*    Recent Results (from the past 240 hours)  Culture, blood (routine x 2)     Status: None (Preliminary result)   Collection Time: 03/12/24  8:16 PM   Specimen: Right Antecubital; Blood  Result Value Ref Range Status   Specimen Description RIGHT ANTECUBITAL  Final   Special Requests   Final    BOTTLES DRAWN AEROBIC AND ANAEROBIC Blood Culture adequate volume   Culture   Final    NO GROWTH < 12 HOURS Performed at [facility], [address], [place], [place] [address]    Report Status PENDING  Incomplete  Culture, blood (routine x 2)     Status: None (Preliminary result)   Collection Time: 03/12/24  8:46 PM   Specimen: BLOOD RIGHT ARM  Result Value Ref Range Status   Specimen Description BLOOD RIGHT ARM  Final   Special Requests   Final    BOTTLES DRAWN AEROBIC AND ANAEROBIC Blood Culture adequate volume   Culture   Final    NO GROWTH < 12 HOURS Performed at [facility], [address] [place], [place] [address]    Report Status PENDING  Incomplete     Radiology Studies: US  Venous Img Lower Unilateral Left (DVT) Result Date: 03/13/2024 CLINICAL DATA:  Left lower extremity pain and edema with wound. EXAM: LEFT LOWER EXTREMITY VENOUS DOPPLER ULTRASOUND TECHNIQUE: Gray-scale sonography with graded compression, as well as color Doppler and duplex ultrasound were performed to evaluate the lower extremity deep venous systems from the level of the common femoral vein and including the common femoral, femoral, profunda femoral, popliteal and calf veins including the posterior tibial,  peroneal and gastrocnemius veins when visible. The superficial great saphenous vein was also interrogated. Spectral Doppler was utilized to evaluate flow at rest and with distal augmentation maneuvers in the common femoral, femoral and popliteal veins. COMPARISON:  None Available. FINDINGS: Contralateral Common Femoral Vein: Respiratory phasicity is normal and symmetric with the symptomatic side. No evidence of thrombus. Normal compressibility. Common Femoral Vein: No evidence of thrombus. Normal compressibility, respiratory phasicity and response to augmentation. Saphenofemoral Junction: No evidence of thrombus. Normal compressibility and flow on color Doppler imaging. Profunda Femoral Vein: No evidence of thrombus. Normal compressibility and flow on color Doppler imaging. Femoral Vein: No evidence of thrombus. Normal compressibility, respiratory phasicity and response to augmentation. Popliteal Vein: No evidence of thrombus. Normal compressibility, respiratory phasicity and response to augmentation. Calf Veins: No evidence  of thrombus. Normal compressibility and flow on color Doppler imaging. Superficial Great Saphenous Vein: No evidence of thrombus. Normal compressibility. Venous Reflux:  None. Other Findings: No evidence of superficial thrombophlebitis or abnormal fluid collection. IMPRESSION: No evidence of left lower extremity deep venous thrombosis. Electronically Signed   By: Erica Hau M.D.   On: 03/13/2024 11:10   DG Ankle Complete Left Result Date: 03/12/2024 CLINICAL DATA:  Cellulitis EXAM: LEFT ANKLE COMPLETE - 3+ VIEW COMPARISON:  None Available. FINDINGS: Frontal, oblique, and lateral views of the left ankle are obtained. No acute fracture, subluxation, or dislocation. 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 There is diffuse subcutaneous edema throughout the left lower leg, ankle, and hindfoot, consistent with given history of cellulitis. No evidence of subcutaneous gas or radiopaque  foreign body. IMPRESSION: 1. Diffuse soft tissue swelling consistent with given history of cellulitis. No subcutaneous gas or radiopaque foreign body. 2. No acute or destructive bony abnormalities. Electronically Signed   By: Bobbye Burrow M.D.   On: 03/12/2024 21:58   DG Chest 2 View Result Date: 03/12/2024 CLINICAL DATA:  Chest pain for 1 hour EXAM: CHEST - 2 VIEW COMPARISON:  01/11/2024 FINDINGS: Frontal and lateral views of the chest demonstrate a stable cardiac silhouette. No acute airspace disease, effusion, or pneumothorax. No acute bony abnormalities. IMPRESSION: 1. No acute intrathoracic process. Electronically Signed   By: Bobbye Burrow M.D.   On: 03/12/2024 20:37    Scheduled Meds:  apixaban   2.5 mg Oral BID   budeson-glycopyrrolate -formoterol   2 puff Inhalation BID   diltiazem   360 mg Oral Daily   famotidine   20 mg Oral Daily   insulin  aspart  0-6 Units Subcutaneous TID WC   insulin  glargine-yfgn  6 Units Subcutaneous QHS   metoprolol  tartrate  25 mg Oral BID   potassium chloride   40 mEq Oral BID   rosuvastatin   40 mg Oral QHS   spironolactone   12.5 mg Oral Daily   tamsulosin   0.4 mg Oral QHS   torsemide   40 mg Oral BID   Continuous Infusions:  [START ON 03/14/2024] vancomycin        LOS: 1 day   Time spent: 55 mins  Christo Hain Lincoln Renshaw, MD How to contact the [facility] Attending or Consulting provider 7A - 7P or covering provider during after hours 7P -7A, for this patient?  Check the care team in [facility] and look for a) attending/consulting [facility] provider listed and b) the [facility] team listed Log into [email or web address] to find provider on call.  Locate the [facility] provider you are looking for under [facility] and page to a number that you can be directly reached. If you still have difficulty reaching the provider, please page the [facility] (Director on Call) for the Hospitalists listed on amion for assistance.  03/13/2024, 1:36 PM

## 2024-03-13 NOTE — Progress Notes (Signed)
   03/13/24 1326  TOC Assessment  TOC screening is complete Yes  Once discharged, how will the patient get to their discharge location? Self/Private Vehicle  Expected Discharge Plan Home w Home Health Services  Barriers to Discharge Continued Medical Work up  Patient states their goals for this hospitalization and ongoing recovery are: Discharge home  Living arrangements for the past 2 months Single Family Home  Lives with: Self  Do you feel safe going back to the place where you live? Y  Permission sought to share information with  Case Manager  Patient language and need for interpreter reviewed: No  Criminal Activity/Legal Involvement Pertinent to Current Situation/Hospitalization Yes - Comment as needed  Need for Family Participation in Patient Care Y  Care giver support system in place? Y  Appearance: Appears stated age  Attitude/Demeanor/Rapport Engaged  Affect (typically observed) Stable  Orientation:  Oriented to Self;Oriented to Place;Oriented to  Time;Oriented to Situation   Admitted with CM cellulitis of lower extremity.

## 2024-03-13 NOTE — Plan of Care (Signed)
  Problem: Elimination: Goal: Will not experience complications related to urinary retention Outcome: Progressing   Problem: Pain Managment: Goal: General experience of comfort will improve and/or be controlled Outcome: Progressing   Problem: Safety: Goal: Ability to remain free from injury will improve Outcome: Progressing   Problem: Skin Integrity: Goal: Risk for impaired skin integrity will decrease Outcome: Progressing   Problem: Fluid Volume: Goal: Ability to maintain a balanced intake and output will improve Outcome: Progressing   Problem: Health Behavior/Discharge Planning: Goal: Ability to manage health-related needs will improve Outcome: Progressing

## 2024-03-14 DIAGNOSIS — E119 Type 2 diabetes mellitus without complications: Secondary | ICD-10-CM

## 2024-03-14 DIAGNOSIS — J449 Chronic obstructive pulmonary disease, unspecified: Secondary | ICD-10-CM

## 2024-03-14 DIAGNOSIS — R079 Chest pain, unspecified: Secondary | ICD-10-CM | POA: Diagnosis not present

## 2024-03-14 DIAGNOSIS — L03116 Cellulitis of left lower limb: Secondary | ICD-10-CM | POA: Diagnosis not present

## 2024-03-14 LAB — GLUCOSE, CAPILLARY
Glucose-Capillary: 144 mg/dL — ABNORMAL HIGH (ref 70–99)
Glucose-Capillary: 182 mg/dL — ABNORMAL HIGH (ref 70–99)

## 2024-03-14 MED ORDER — DILTIAZEM HCL ER COATED BEADS 360 MG PO CP24: 360.0000 mg | ORAL_CAPSULE | Freq: Every day | ORAL | Status: AC

## 2024-03-14 MED ORDER — DOXYCYCLINE HYCLATE 100 MG PO CAPS: 100.0000 mg | ORAL_CAPSULE | Freq: Two times a day (BID) | ORAL | 0 refills | Status: AC

## 2024-03-14 MED ORDER — TAMSULOSIN HCL 0.4 MG PO CAPS: 0.4000 mg | ORAL_CAPSULE | Freq: Every day | ORAL | Status: AC

## 2024-03-14 MED ORDER — TORSEMIDE 20 MG PO TABS: 40.0000 mg | ORAL_TABLET | Freq: Two times a day (BID) | ORAL | Status: AC

## 2024-03-14 NOTE — Discharge Summary (Signed)
 Physician Discharge Summary  Vincent Cooper ZOX:[id number] DOB: 10-06-1941 DOA: 03/12/2024  PCP: Orest Bio, MD  Admit date: 03/12/2024 Discharge date: 03/14/2024  Admitted From:  HOME  Disposition: HOME   Recommendations for Outpatient Follow-up:  Follow up with PCP in 1 weeks  Discharge Condition: STABLE   CODE STATUS: FULL DIET: low sodium heart healthy recommended    Brief Hospitalization Summary: Please see all hospital notes, images, labs for full details of the hospitalization. Admission provider  HPI: 83 y.o. 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   Reports 1.5 weeks ago he developed a clear vesicle on distal LLE which ultimately deroofed and was draining clear serous fluid. Within past few days has had worsening redness which is spread up his leg.  Does have some purulent drainage from area of vesicle.  He is not exactly clear when appearance of the deroofed vesicles became darker but thinks they are healing.  No fevers, chills.  Mild pain associated.   Saw his primary care physician yesterday and prescribed clindamycin .  After taking a dose of clindamycin  he had an episode of chest pain which was not exertional.  Denies any other associated symptoms.   Hospital Course by listed problem  Cellulitis of LLE - given purulence we started him on IV vancomycin  - I have consulted pharm D to dose IV vancomycin  in hospital  - continue elevation of the LLE - he has had remarkable improvement since starting vancomycin  - cellulitis is resolving now - DC home today on oral doxycycline  to complete course of therapy - Pt to see his PCP later this week for a recheck   Lactic acidosis - RESOLVED - treated with IV fluid hydration in ED   Atypical CP - RESOLVED  - symptoms resolved now -  suspect was Upper GI related - treated with famotidine    Chronic HFpEF - resumed home diuretics - appears compensated at this time   History of PE Acquired thrombophilia - resumed home apixaban    Permanent Atrial Fibrillation  - resumed home metoprolol  and diltiazem  for rate control management - resumed apixaban  for full dose anticoagulation    Essential hypertension  - resumed home medication    CAD - resumed home rosuvastatin  and metoprolol     Hyperlipidemia - resume home rosuvastatin     OSA  - night CPAP offered   COPD - stable on home bronchodilators   BPH - resumed home tamsulosin    Type 2  DM with renal complications - monitor CBG closely  - SSI coverage - low dose semglee  ordered - A1c 5.4%   CBG (last 3)  Recent Labs    03/13/24 1616 03/13/24 2032 03/14/24 0742  GLUCAP 168* 128* 144*    Discharge Diagnoses:  Principal Problem:   Cellulitis of lower extremity Active Problems:   COPD (chronic obstructive pulmonary disease) (HCC)   Diabetes mellitus type II, non insulin  dependent (HCC)   Chronic diastolic HF (heart failure) (HCC)   Chest pain  Discharge Instructions:  Allergies as of 03/14/2024       Reactions   Ancef  [cefazolin ] Itching, Rash   Other Other (See Comments)   Patient reports he was allergic to something in an IV he was given but does not know what the substance was. As of 01/19/2020         Medication List  STOP taking these medications    clindamycin  300 MG capsule Commonly known as: CLEOCIN        TAKE these medications    Accu-Chek Guide test strip Generic drug: glucose blood USE TO TEST BLOOD SUGAREONCE DAILY.   Accu-Chek Guide w/Device Kit USE AS DIRECTEDBh   Accu-Chek Softclix Lancets lancets daily.   AeroChamber MV inhaler Use as instructed   albuterol  108 (90 Base) MCG/ACT inhaler Commonly known as: VENTOLIN  HFA Inhale 1 puff into the lungs every 6 (six) hours as needed for wheezing or  shortness of breath.   apixaban  2.5 MG Tabs tablet Commonly known as: Eliquis  Take 1 tablet (2.5 mg total) by mouth 2 (two) times daily.   Breztri  Aerosphere 160-9-4.8 MCG/ACT Aero inhaler Generic drug: budeson-glycopyrrolate -formoterol  Inhale 2 puffs into the lungs in the morning and at bedtime.   diltiazem  360 MG 24 hr capsule Commonly known as: CARDIZEM  CD Take 1 capsule (360 mg total) by mouth daily. Start taking on: March 15, 2024   doxycycline  100 MG capsule Commonly known as: VIBRAMYCIN  Take 1 capsule (100 mg total) by mouth 2 (two) times daily for 5 days. Start taking on: March 15, 2024   hydroxypropyl methylcellulose / hypromellose 2.5 % ophthalmic solution Commonly known as: ISOPTO TEARS / GONIOVISC Place 1 drop into both eyes as needed for dry eyes.   metFORMIN  500 MG 24 hr tablet Commonly known as: GLUCOPHAGE -XR Take 1 tablet (500 mg total) by mouth every morning.   metoprolol  tartrate 25 MG tablet Commonly known as: LOPRESSOR  Take 1 tablet (25 mg total) by mouth 2 (two) times daily.   nitroGLYCERIN  0.4 MG SL tablet Commonly known as: NITROSTAT  Place 1 tablet (0.4 mg total) under the tongue every 5 (five) minutes as needed for chest pain.   NON FORMULARY Pt uses a cpap nightly   Nucala  100 MG/ML Soaj Generic drug: Mepolizumab  Inject 1 mL (100 mg total) into the skin every 28 (twenty-eight) days.   potassium chloride  SA 20 MEQ tablet Commonly known as: KLOR-CON  M Take 20 mEq by mouth 2 (two) times daily.   rosuvastatin  40 MG tablet Commonly known as: CRESTOR  TAKE 1 TABLET BY MOUTH ONCE DAILY.   spironolactone  25 MG tablet Commonly known as: Aldactone  Take 0.5 tablets (12.5 mg total) by mouth daily.   tamsulosin  0.4 MG Caps capsule Commonly known as: FLOMAX  Take 1 capsule (0.4 mg total) by mouth at bedtime.   torsemide  20 MG tablet Commonly known as: DEMADEX  Take 2 tablets (40 mg total) by mouth 2 (two) times daily. Start taking on: March 15, 2024        Follow-up Information     Sasser, Ky Phillips, MD. Schedule an appointment as soon as possible for a visit in 1 week(s).   Specialty: Family Medicine Why: Hospital Follow Up Contact information: [address] [place] [address] [phone]                Allergies  Allergen Reactions   Ancef  [Cefazolin ] Itching and Rash   Other Other (See Comments)    Patient reports he was allergic to something in an IV he was given but does not know what the substance was. As of 01/19/2020    Allergies as of 03/14/2024       Reactions   Ancef  [cefazolin ] Itching, Rash   Other Other (See Comments)   Patient reports he was allergic to something in an IV he was given but does not know what the substance was.  As of 01/19/2020         Medication List     STOP taking these medications    clindamycin  300 MG capsule Commonly known as: CLEOCIN        TAKE these medications    Accu-Chek Guide test strip Generic drug: glucose blood USE TO TEST BLOOD SUGAREONCE DAILY.   Accu-Chek Guide w/Device Kit USE AS DIRECTEDBh   Accu-Chek Softclix Lancets lancets daily.   AeroChamber MV inhaler Use as instructed   albuterol  108 (90 Base) MCG/ACT inhaler Commonly known as: VENTOLIN  HFA Inhale 1 puff into the lungs every 6 (six) hours as needed for wheezing or shortness of breath.   apixaban  2.5 MG Tabs tablet Commonly known as: Eliquis  Take 1 tablet (2.5 mg total) by mouth 2 (two) times daily.   Breztri  Aerosphere 160-9-4.8 MCG/ACT Aero inhaler Generic drug: budeson-glycopyrrolate -formoterol  Inhale 2 puffs into the lungs in the morning and at bedtime.   diltiazem  360 MG 24 hr capsule Commonly known as: CARDIZEM  CD Take 1 capsule (360 mg total) by mouth daily. Start taking on: March 15, 2024   doxycycline  100 MG capsule Commonly known as: VIBRAMYCIN  Take 1 capsule (100 mg total) by mouth 2 (two) times daily for 5 days. Start taking on: March 15, 2024   hydroxypropyl  methylcellulose / hypromellose 2.5 % ophthalmic solution Commonly known as: ISOPTO TEARS / GONIOVISC Place 1 drop into both eyes as needed for dry eyes.   metFORMIN  500 MG 24 hr tablet Commonly known as: GLUCOPHAGE -XR Take 1 tablet (500 mg total) by mouth every morning.   metoprolol  tartrate 25 MG tablet Commonly known as: LOPRESSOR  Take 1 tablet (25 mg total) by mouth 2 (two) times daily.   nitroGLYCERIN  0.4 MG SL tablet Commonly known as: NITROSTAT  Place 1 tablet (0.4 mg total) under the tongue every 5 (five) minutes as needed for chest pain.   NON FORMULARY Pt uses a cpap nightly   Nucala  100 MG/ML Soaj Generic drug: Mepolizumab  Inject 1 mL (100 mg total) into the skin every 28 (twenty-eight) days.   potassium chloride  SA 20 MEQ tablet Commonly known as: KLOR-CON  M Take 20 mEq by mouth 2 (two) times daily.   rosuvastatin  40 MG tablet Commonly known as: CRESTOR  TAKE 1 TABLET BY MOUTH ONCE DAILY.   spironolactone  25 MG tablet Commonly known as: Aldactone  Take 0.5 tablets (12.5 mg total) by mouth daily.   tamsulosin  0.4 MG Caps capsule Commonly known as: FLOMAX  Take 1 capsule (0.4 mg total) by mouth at bedtime.   torsemide  20 MG tablet Commonly known as: DEMADEX  Take 2 tablets (40 mg total) by mouth 2 (two) times daily. Start taking on: March 15, 2024        Procedures/Studies: US  Venous Img Lower Unilateral Left (DVT) Result Date: 03/13/2024 CLINICAL DATA:  Left lower extremity pain and edema with wound. EXAM: LEFT LOWER EXTREMITY VENOUS DOPPLER ULTRASOUND TECHNIQUE: Gray-scale sonography with graded compression, as well as color Doppler and duplex ultrasound were performed to evaluate the lower extremity deep venous systems from the level of the common femoral vein and including the common femoral, femoral, profunda femoral, popliteal and calf veins including the posterior tibial, peroneal and gastrocnemius veins when visible. The superficial great saphenous vein  was also interrogated. Spectral Doppler was utilized to evaluate flow at rest and with distal augmentation maneuvers in the common femoral, femoral and popliteal veins. COMPARISON:  None Available. FINDINGS: Contralateral Common Femoral Vein: Respiratory phasicity is normal and symmetric with the symptomatic side. No  evidence of thrombus. Normal compressibility. Common Femoral Vein: No evidence of thrombus. Normal compressibility, respiratory phasicity and response to augmentation. Saphenofemoral Junction: No evidence of thrombus. Normal compressibility and flow on color Doppler imaging. Profunda Femoral Vein: No evidence of thrombus. Normal compressibility and flow on color Doppler imaging. Femoral Vein: No evidence of thrombus. Normal compressibility, respiratory phasicity and response to augmentation. Popliteal Vein: No evidence of thrombus. Normal compressibility, respiratory phasicity and response to augmentation. Calf Veins: No evidence of thrombus. Normal compressibility and flow on color Doppler imaging. Superficial Great Saphenous Vein: No evidence of thrombus. Normal compressibility. Venous Reflux:  None. Other Findings: No evidence of superficial thrombophlebitis or abnormal fluid collection. IMPRESSION: No evidence of left lower extremity deep venous thrombosis. Electronically Signed   By: Erica Hau M.D.   On: 03/13/2024 11:10   DG Ankle Complete Left Result Date: 03/12/2024 CLINICAL DATA:  Cellulitis EXAM: LEFT ANKLE COMPLETE - 3+ VIEW COMPARISON:  None Available. FINDINGS: Frontal, oblique, and lateral views of the left ankle are obtained. No acute fracture, subluxation, or dislocation. 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 There is diffuse subcutaneous edema throughout the left lower leg, ankle, and hindfoot, consistent with given history of cellulitis. No evidence of subcutaneous gas or radiopaque foreign body. IMPRESSION: 1. Diffuse soft tissue swelling consistent with given  history of cellulitis. No subcutaneous gas or radiopaque foreign body. 2. No acute or destructive bony abnormalities. Electronically Signed   By: Bobbye Burrow M.D.   On: 03/12/2024 21:58   DG Chest 2 View Result Date: 03/12/2024 CLINICAL DATA:  Chest pain for 1 hour EXAM: CHEST - 2 VIEW COMPARISON:  01/11/2024 FINDINGS: Frontal and lateral views of the chest demonstrate a stable cardiac silhouette. No acute airspace disease, effusion, or pneumothorax. No acute bony abnormalities. IMPRESSION: 1. No acute intrathoracic process. Electronically Signed   By: Bobbye Burrow M.D.   On: 03/12/2024 20:37     Subjective: Pt reports that leg is much improved, he has been elevating it all night, he has no pain or discomfort in the leg and he is eager to discharge home today. He denies fever and chills.   Discharge Exam: Vitals:   03/14/24 0738 03/14/24 0918  BP:  119/61  Pulse:  72  Resp:  18  Temp:    SpO2: 95% 95%   Vitals:   03/13/24 2008 03/14/24 0300 03/14/24 0738 03/14/24 0918  BP:  94/77  119/61  Pulse:  70  72  Resp:  20  18  Temp:  98 F (36.7 C)    TempSrc:  Oral    SpO2: 94% 96% 95% 95%  Weight:      Height:       General: Pt is alert, awake, not in acute distress Cardiovascular: normal S1/S2 +, no rubs, no gallops Respiratory: CTA bilaterally, no wheezing, no rhonchi Abdominal: Soft, NT, ND, bowel sounds + Extremities: left leg cellulitis appears markedly improved, erythema resolving, edema resolving with wrinkling of skin seen, no drainage or purulence seen, warm with palpable DP pulse, resolving cellulitis   The results of significant diagnostics from this hospitalization (including imaging, microbiology, ancillary and laboratory) are listed below for reference.     Microbiology: Recent Results (from the past 240 hours)  Culture, blood (routine x 2)     Status: None (Preliminary result)   Collection Time: 03/12/24  8:16 PM   Specimen: Right Antecubital; Blood  Result  Value Ref Range Status   Specimen Description RIGHT ANTECUBITAL  Final  Special Requests   Final    BOTTLES DRAWN AEROBIC AND ANAEROBIC Blood Culture adequate volume   Culture   Final    NO GROWTH 2 DAYS Performed at [facility], [address], [place], [place] [address]    Report Status PENDING  Incomplete  Culture, blood (routine x 2)     Status: None (Preliminary result)   Collection Time: 03/12/24  8:46 PM   Specimen: BLOOD RIGHT ARM  Result Value Ref Range Status   Specimen Description BLOOD RIGHT ARM  Final   Special Requests   Final    BOTTLES DRAWN AEROBIC AND ANAEROBIC Blood Culture adequate volume   Culture   Final    NO GROWTH 2 DAYS Performed at [facility], [address], [place], [place] [address]    Report Status PENDING  Incomplete     Labs: BNP (last 3 results) Recent Labs    01/11/24 1820 03/13/24 0422  BNP 223.0* 113.0*   Basic Metabolic Panel: Recent Labs  Lab 03/12/24 2017 03/13/24 0422  NA 134* 134*  K 3.5 3.4*  CL 100 102  CO2 22 24  GLUCOSE 192* 161*  BUN 26* 24*  CREATININE 1.39* 1.17  CALCIUM  8.3* 7.9*  MG  --  2.0  PHOS  --  3.0   Liver Function Tests: Recent Labs  Lab 03/12/24 2017  AST 27  ALT 27  ALKPHOS 62  BILITOT 1.4*  PROT 6.5  ALBUMIN  3.3*   No results for input(s): "LIPASE", "AMYLASE" in the last 168 hours. No results for input(s): "AMMONIA" in the last 168 hours. CBC: Recent Labs  Lab 03/12/24 2017 03/13/24 0422  WBC 9.6 7.3  HGB 14.3 12.7*  HCT 42.3 38.0*  MCV 97.0 97.4  PLT 213 194   Cardiac Enzymes: No results for input(s): "CKTOTAL", "CKMB", "CKMBINDEX", "TROPONINI" in the last 168 hours. BNP: Invalid input(s): "POCBNP" CBG: Recent Labs  Lab 03/13/24 0715 03/13/24 1106 03/13/24 1616 03/13/24 2032 03/14/24 0742  GLUCAP 151* 156* 168* 128* 144*   D-Dimer No results for input(s): "DDIMER" in the last 72 hours. Hgb A1c Recent Labs    03/13/24 0422  HGBA1C 5.4   Lipid Profile No  results for input(s): "CHOL", "HDL", "LDLCALC", "TRIG", "CHOLHDL", "LDLDIRECT" in the last 72 hours. Thyroid function studies No results for input(s): "TSH", "T4TOTAL", "T3FREE", "THYROIDAB" in the last 72 hours.  Invalid input(s): "FREET3" Anemia work up No results for input(s): "VITAMINB12", "FOLATE", "FERRITIN", "TIBC", "IRON", "RETICCTPCT" in the last 72 hours. Urinalysis    Component Value Date/Time   COLORURINE YELLOW 05/20/2023 0233   APPEARANCEUR CLEAR 05/20/2023 0233   LABSPEC 1.021 05/20/2023 0233   PHURINE 6.0 05/20/2023 0233   GLUCOSEU NEGATIVE 05/20/2023 0233   HGBUR MODERATE (A) 05/20/2023 0233   BILIRUBINUR NEGATIVE 05/20/2023 0233   KETONESUR NEGATIVE 05/20/2023 0233   PROTEINUR 100 (A) 05/20/2023 0233   NITRITE NEGATIVE 05/20/2023 0233   LEUKOCYTESUR SMALL (A) 05/20/2023 0233   Sepsis Labs Recent Labs  Lab 03/12/24 2017 03/13/24 0422  WBC 9.6 7.3   Microbiology Recent Results (from the past 240 hours)  Culture, blood (routine x 2)     Status: None (Preliminary result)   Collection Time: 03/12/24  8:16 PM   Specimen: Right Antecubital; Blood  Result Value Ref Range Status   Specimen Description RIGHT ANTECUBITAL  Final   Special Requests   Final    BOTTLES DRAWN AEROBIC AND ANAEROBIC Blood Culture adequate volume   Culture   Final  NO GROWTH 2 DAYS Performed at [facility], [address] [place], [place] [address]    Report Status PENDING  Incomplete  Culture, blood (routine x 2)     Status: None (Preliminary result)   Collection Time: 03/12/24  8:46 PM   Specimen: BLOOD RIGHT ARM  Result Value Ref Range Status   Specimen Description BLOOD RIGHT ARM  Final   Special Requests   Final    BOTTLES DRAWN AEROBIC AND ANAEROBIC Blood Culture adequate volume   Culture   Final    NO GROWTH 2 DAYS Performed at [facility], [address], [place], [place] [address]    Report Status PENDING  Incomplete   Time coordinating discharge: 41  mins  SIGNED:  Faustino Hook, MD  [facility] 03/14/2024, 9:49 AM How to contact the [facility] Attending or Consulting provider 7A - 7P or covering provider during after hours 7P -7A, for this patient?  Check the care team in [facility] and look for a) attending/consulting [facility] provider listed and b) the [facility] team listed Log into [email or web address] and use [place]'s universal password to access. If you do not have the password, please contact the hospital operator. Locate the [facility] provider you are looking for under [facility] and page to a number that you can be directly reached. If you still have difficulty reaching the provider, please page the [facility] (Director on Call) for the Hospitalists listed on amion for assistance.

## 2024-03-14 NOTE — Plan of Care (Signed)

## 2024-03-14 NOTE — Discharge Instructions (Addendum)
 STOP TAKING THE CLINDAMYCIN   START TAKING DOXYCYCLINE  ON 03/15/24   FOLLOW UP WITH PCP IN 5 DAYS TO HAVE RECHECKED  ELEVATE LEFT LEG AS MUCH AS POSSIBLE   IMPORTANT INFORMATION: PAY CLOSE ATTENTION   PHYSICIAN DISCHARGE INSTRUCTIONS  Follow with Primary care provider  Sasser, Ky Phillips, MD  and other consultants as instructed by your Hospitalist Physician  SEEK MEDICAL CARE OR RETURN TO EMERGENCY ROOM IF SYMPTOMS COME BACK, WORSEN OR NEW PROBLEM DEVELOPS   Please note: You were cared for by a hospitalist during your hospital stay. Every effort will be made to forward records to your primary care provider.  You can request that your primary care provider send for your hospital records if they have not received them.  Once you are discharged, your primary care physician will handle any further medical issues. Please note that NO REFILLS for any discharge medications will be authorized once you are discharged, as it is imperative that you return to your primary care physician (or establish a relationship with a primary care physician if you do not have one) for your post hospital discharge needs so that they can reassess your need for medications and monitor your lab values.  Please get a complete blood count and chemistry panel checked by your Primary MD at your next visit, and again as instructed by your Primary MD.  Get Medicines reviewed and adjusted: Please take all your medications with you for your next visit with your Primary MD  Laboratory/radiological data: Please request your Primary MD to go over all hospital tests and procedure/radiological results at the follow up, please ask your primary care provider to get all Hospital records sent to his/her office.  In some cases, they will be blood work, cultures and biopsy results pending at the time of your discharge. Please request that your primary care provider follow up on these results.  If you are diabetic, please bring your blood  sugar readings with you to your follow up appointment with primary care.    Please call and make your follow up appointments as soon as possible.    Also Note the following: If you experience worsening of your admission symptoms, develop shortness of breath, life threatening emergency, suicidal or homicidal thoughts you must seek medical attention immediately by calling 911 or calling your MD immediately  if symptoms less severe.  You must read complete instructions/literature along with all the possible adverse reactions/side effects for all the Medicines you take and that have been prescribed to you. Take any new Medicines after you have completely understood and accpet all the possible adverse reactions/side effects.   Do not drive when taking Pain medications or sleeping medications (Benzodiazepines)  Do not take more than prescribed Pain, Sleep and Anxiety Medications. It is not advisable to combine anxiety,sleep and pain medications without talking with your primary care practitioner  Special Instructions: If you have smoked or chewed Tobacco  in the last 2 yrs please stop smoking, stop any regular Alcohol   and or any Recreational drug use.  Wear Seat belts while driving.  Do not drive if taking any narcotic, mind altering or controlled substances or recreational drugs or alcohol .

## 2024-03-14 NOTE — Progress Notes (Signed)
 Patient refused CPAP.

## 2024-03-17 DIAGNOSIS — L03116 Cellulitis of left lower limb: Secondary | ICD-10-CM | POA: Diagnosis not present

## 2024-03-17 DIAGNOSIS — Z6829 Body mass index (BMI) 29.0-29.9, adult: Secondary | ICD-10-CM | POA: Diagnosis not present

## 2024-03-17 LAB — CULTURE, BLOOD (ROUTINE X 2)
Culture: NO GROWTH
Culture: NO GROWTH
Special Requests: ADEQUATE
Special Requests: ADEQUATE

## 2024-03-29 ENCOUNTER — Other Ambulatory Visit: Payer: Self-pay

## 2024-03-29 NOTE — Progress Notes (Signed)
 Specialty Pharmacy Refill Coordination Note  Vincent Cooper is a 83 y.o. male contacted today regarding refills of specialty medication(s) Mepolizumab  (Nucala )   Patient requested Delivery   Delivery date: 03/30/24   Verified address: [address] [place]   Medication will be filled on 03/29/24.

## 2024-03-29 NOTE — Progress Notes (Signed)
 Specialty Pharmacy Ongoing Clinical Assessment Note  Spoke to patient's daughter, Vincent Cooper is a 83 y.o. male who is being followed by the specialty pharmacy service for RxSp Asthma/COPD   Patient's specialty medication(s) reviewed today: Mepolizumab  (Nucala )   Missed doses in the last 4 weeks: 0   Patient/Caregiver did not have any additional questions or concerns.   Therapeutic benefit summary: Patient is achieving benefit   Adverse events/side effects summary: No adverse events/side effects   Patient's therapy is appropriate to: Continue    Goals Addressed             This Visit's Progress    Reduce disease symptoms including coughing and shortness of breath   On track    Patient is on track. Patient will maintain adherence         Follow up: 6 months  [facility] Specialty Pharmacist

## 2024-04-13 DIAGNOSIS — I5033 Acute on chronic diastolic (congestive) heart failure: Secondary | ICD-10-CM | POA: Diagnosis not present

## 2024-04-13 DIAGNOSIS — L03116 Cellulitis of left lower limb: Secondary | ICD-10-CM | POA: Diagnosis not present

## 2024-04-13 DIAGNOSIS — J449 Chronic obstructive pulmonary disease, unspecified: Secondary | ICD-10-CM | POA: Diagnosis not present

## 2024-04-13 DIAGNOSIS — L270 Generalized skin eruption due to drugs and medicaments taken internally: Secondary | ICD-10-CM | POA: Diagnosis not present

## 2024-04-14 ENCOUNTER — Other Ambulatory Visit (HOSPITAL_COMMUNITY): Payer: Self-pay

## 2024-04-16 ENCOUNTER — Other Ambulatory Visit (HOSPITAL_COMMUNITY): Payer: Self-pay

## 2024-04-26 DIAGNOSIS — R5383 Other fatigue: Secondary | ICD-10-CM | POA: Diagnosis not present

## 2024-04-26 DIAGNOSIS — N1831 Chronic kidney disease, stage 3a: Secondary | ICD-10-CM | POA: Diagnosis not present

## 2024-04-26 DIAGNOSIS — E7849 Other hyperlipidemia: Secondary | ICD-10-CM | POA: Diagnosis not present

## 2024-04-26 DIAGNOSIS — E1122 Type 2 diabetes mellitus with diabetic chronic kidney disease: Secondary | ICD-10-CM | POA: Diagnosis not present

## 2024-04-27 ENCOUNTER — Other Ambulatory Visit: Payer: Self-pay

## 2024-04-27 NOTE — Progress Notes (Signed)
 Specialty Pharmacy Refill Coordination Note  Vincent Cooper is a 83 y.o. male contacted today regarding refills of specialty medication(s) Mepolizumab  (Nucala )   Patient requested Delivery   Delivery date: 04/28/24   Verified address: [address] [place]   Medication will be filled on 04/27/24.

## 2024-05-03 DIAGNOSIS — E1122 Type 2 diabetes mellitus with diabetic chronic kidney disease: Secondary | ICD-10-CM | POA: Diagnosis not present

## 2024-05-03 DIAGNOSIS — Z6839 Body mass index (BMI) 39.0-39.9, adult: Secondary | ICD-10-CM | POA: Diagnosis not present

## 2024-05-03 DIAGNOSIS — N1831 Chronic kidney disease, stage 3a: Secondary | ICD-10-CM | POA: Diagnosis not present

## 2024-05-03 DIAGNOSIS — E782 Mixed hyperlipidemia: Secondary | ICD-10-CM | POA: Diagnosis not present

## 2024-05-03 DIAGNOSIS — J449 Chronic obstructive pulmonary disease, unspecified: Secondary | ICD-10-CM | POA: Diagnosis not present

## 2024-05-12 ENCOUNTER — Ambulatory Visit (HOSPITAL_BASED_OUTPATIENT_CLINIC_OR_DEPARTMENT_OTHER): Attending: Internal Medicine | Admitting: Internal Medicine

## 2024-05-27 ENCOUNTER — Other Ambulatory Visit (HOSPITAL_COMMUNITY): Payer: Self-pay

## 2024-05-27 ENCOUNTER — Other Ambulatory Visit: Payer: Self-pay

## 2024-05-27 NOTE — Progress Notes (Signed)
 Specialty Pharmacy Refill Coordination Note  Vincent Cooper is a 83 y.o. male contacted today regarding refills of specialty medication(s) Mepolizumab  (Nucala )   Patient requested Delivery   Delivery date: 06/02/24   Verified address: [address] [place]   Medication will be filled on 06/01/24.

## 2024-06-08 ENCOUNTER — Emergency Department (HOSPITAL_COMMUNITY)
Admission: EM | Admit: 2024-06-08 | Discharge: 2024-06-08 | Disposition: A | Discharge status: Home / Self Care | Attending: Emergency Medicine | Admitting: Emergency Medicine

## 2024-06-08 ENCOUNTER — Encounter (HOSPITAL_COMMUNITY): Payer: Self-pay | Admitting: *Deleted

## 2024-06-08 ENCOUNTER — Emergency Department (HOSPITAL_COMMUNITY)

## 2024-06-08 ENCOUNTER — Other Ambulatory Visit: Payer: Self-pay

## 2024-06-08 DIAGNOSIS — Z7901 Long term (current) use of anticoagulants: Secondary | ICD-10-CM | POA: Insufficient documentation

## 2024-06-08 DIAGNOSIS — I517 Cardiomegaly: Secondary | ICD-10-CM | POA: Diagnosis not present

## 2024-06-08 DIAGNOSIS — I2510 Atherosclerotic heart disease of native coronary artery without angina pectoris: Secondary | ICD-10-CM | POA: Insufficient documentation

## 2024-06-08 DIAGNOSIS — I110 Hypertensive heart disease with heart failure: Secondary | ICD-10-CM | POA: Insufficient documentation

## 2024-06-08 DIAGNOSIS — Z7951 Long term (current) use of inhaled steroids: Secondary | ICD-10-CM | POA: Diagnosis not present

## 2024-06-08 DIAGNOSIS — Z79899 Other long term (current) drug therapy: Secondary | ICD-10-CM | POA: Diagnosis not present

## 2024-06-08 DIAGNOSIS — J449 Chronic obstructive pulmonary disease, unspecified: Secondary | ICD-10-CM | POA: Insufficient documentation

## 2024-06-08 DIAGNOSIS — I700 Atherosclerosis of aorta: Secondary | ICD-10-CM | POA: Diagnosis not present

## 2024-06-08 DIAGNOSIS — R0602 Shortness of breath: Secondary | ICD-10-CM | POA: Diagnosis not present

## 2024-06-08 DIAGNOSIS — R7989 Other specified abnormal findings of blood chemistry: Secondary | ICD-10-CM | POA: Diagnosis not present

## 2024-06-08 DIAGNOSIS — R0989 Other specified symptoms and signs involving the circulatory and respiratory systems: Secondary | ICD-10-CM | POA: Diagnosis not present

## 2024-06-08 DIAGNOSIS — I509 Heart failure, unspecified: Secondary | ICD-10-CM | POA: Diagnosis not present

## 2024-06-08 DIAGNOSIS — R600 Localized edema: Secondary | ICD-10-CM | POA: Diagnosis not present

## 2024-06-08 LAB — RESP PANEL BY RT-PCR (RSV, FLU A&B, COVID)  RVPGX2
Influenza A by PCR: NEGATIVE
Influenza B by PCR: NEGATIVE
Resp Syncytial Virus by PCR: NEGATIVE
SARS Coronavirus 2 by RT PCR: NEGATIVE

## 2024-06-08 LAB — BASIC METABOLIC PANEL WITH GFR
Anion gap: 15 (ref 5–15)
BUN: 30 mg/dL — ABNORMAL HIGH (ref 8–23)
CO2: 25 mmol/L (ref 22–32)
Calcium: 9 mg/dL (ref 8.9–10.3)
Chloride: 98 mmol/L (ref 98–111)
Creatinine, Ser: 1.48 mg/dL — ABNORMAL HIGH (ref 0.61–1.24)
GFR, Estimated: 47 mL/min — ABNORMAL LOW (ref >60)
Glucose, Bld: 176 mg/dL — ABNORMAL HIGH (ref 70–99)
Potassium: 4.4 mmol/L (ref 3.5–5.1)
Sodium: 138 mmol/L (ref 135–145)

## 2024-06-08 LAB — CBC WITH DIFFERENTIAL/PLATELET
Abs Immature Granulocytes: 0.06 K/uL (ref 0.00–0.07)
Basophils Absolute: 0 K/uL (ref 0.0–0.1)
Basophils Relative: 0 %
Eosinophils Absolute: 0 K/uL (ref 0.0–0.5)
Eosinophils Relative: 0 %
HCT: 43.4 % (ref 39.0–52.0)
Hemoglobin: 14.7 g/dL (ref 13.0–17.0)
Immature Granulocytes: 1 %
Lymphocytes Relative: 16 %
Lymphs Abs: 1.5 K/uL (ref 0.7–4.0)
MCH: 33.3 pg (ref 26.0–34.0)
MCHC: 33.9 g/dL (ref 30.0–36.0)
MCV: 98.2 fL (ref 80.0–100.0)
Monocytes Absolute: 0.8 K/uL (ref 0.1–1.0)
Monocytes Relative: 9 %
Neutro Abs: 6.8 K/uL (ref 1.7–7.7)
Neutrophils Relative %: 74 %
Platelets: 171 K/uL (ref 150–400)
RBC: 4.42 MIL/uL (ref 4.22–5.81)
RDW: 16.7 % — ABNORMAL HIGH (ref 11.5–15.5)
WBC: 9.2 K/uL (ref 4.0–10.5)
nRBC: 0 % (ref 0.0–0.2)

## 2024-06-08 LAB — TROPONIN I (HIGH SENSITIVITY)
Troponin I (High Sensitivity): 6 ng/L (ref <18)
Troponin I (High Sensitivity): 5 ng/L (ref <18)

## 2024-06-08 LAB — BRAIN NATRIURETIC PEPTIDE: B Natriuretic Peptide: 203 pg/mL — ABNORMAL HIGH (ref 0.0–100.0)

## 2024-06-08 MED ORDER — FUROSEMIDE 10 MG/ML IJ SOLN
40.0000 mg | Freq: Once | INTRAMUSCULAR | Status: AC
  Administered 2024-06-08: 40 mg via INTRAVENOUS
  Filled 2024-06-08: qty 4

## 2024-06-08 NOTE — ED Provider Notes (Signed)
 [place] EMERGENCY DEPARTMENT AT [facility] Provider Note   CSN: [id number] Arrival date & time: 06/08/24  1624     Patient presents with: Shortness of Breath   Vincent Cooper is a 83 y.o. male.   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  Pt said his legs have been swelling for the past 2 weeks.  He's had more sob.  He's been compliant with his demadex  and his cpap.  He tried to take a nap today, but could not sleep because he felt like he could not get air when he fell asleep.  No chest pain.  No f/c.       Prior to Admission medications   Medication Sig Start Date End Date Taking? Authorizing Provider  albuterol  (VENTOLIN  HFA) 108 (90 Base) MCG/ACT inhaler Inhale 1 puff into the lungs every 6 (six) hours as needed for wheezing or shortness of breath. 05/02/23  Yes Gladis Leonor HERO, MD  apixaban  (ELIQUIS ) 2.5 MG TABS tablet Take 1 tablet (2.5 mg total) by mouth 2 (two) times daily. 09/05/22  Yes Kallie Manuelita BROCKS, MD  budeson-glycopyrrolate -formoterol  (BREZTRI  AEROSPHERE) 160-9-4.8 MCG/ACT AERO inhaler Inhale 2 puffs into the lungs in the morning and at bedtime. 03/02/24  Yes Meade Verdon RAMAN, MD  diltiazem  (CARDIZEM  CD) 360 MG 24 hr capsule Take 1 capsule (360 mg total) by mouth daily. 03/15/24  Yes Johnson, Clanford L, MD  hydroxypropyl methylcellulose / hypromellose (ISOPTO TEARS / GONIOVISC) 2.5 % ophthalmic solution Place 1 drop into both eyes as needed for dry eyes.   Yes [provider]  losartan (COZAAR) 25 MG tablet Take 12.5 mg by mouth daily. 05/03/24  Yes [provider]  Mepolizumab  (NUCALA ) 100 MG/ML SOAJ Inject 1 mL (100 mg total) into the skin every 28 (twenty-eight) days. 03/02/24  Yes Meade Verdon RAMAN, MD  metFORMIN  (GLUCOPHAGE -XR) 500 MG 24 hr tablet Take 1 tablet (500 mg total) by mouth every morning. 02/18/24  Yes Therisa Benton PARAS, NP  metoprolol  tartrate  (LOPRESSOR ) 25 MG tablet Take 1 tablet (25 mg total) by mouth 2 (two) times daily. 06/29/20  Yes Emokpae, Courage, MD  nitroGLYCERIN  (NITROSTAT ) 0.4 MG SL tablet Place 1 tablet (0.4 mg total) under the tongue every 5 (five) minutes as needed for chest pain. 05/28/23  Yes Anner Alm ORN, MD  potassium chloride  SA (KLOR-CON  M) 20 MEQ tablet Take 20 mEq by mouth 2 (two) times daily. 01/21/23  Yes [provider]  rosuvastatin  (CRESTOR ) 40 MG tablet TAKE 1 TABLET BY MOUTH ONCE DAILY. 12/11/23  Yes Anner Alm ORN, MD  spironolactone  (ALDACTONE ) 25 MG tablet Take 0.5 tablets (12.5 mg total) by mouth daily. 02/02/23 06/08/24 Yes Ricky Fines, MD  tamsulosin  (FLOMAX ) 0.4 MG CAPS capsule Take 1 capsule (0.4 mg total) by mouth at bedtime. 03/14/24  Yes Johnson, Clanford L, MD  torsemide  (DEMADEX ) 20 MG tablet Take 2 tablets (40 mg total) by mouth 2 (two) times daily. 03/15/24  Yes Johnson, Clanford L, MD  ACCU-CHEK GUIDE test strip USE TO TEST BLOOD SUGAREONCE DAILY. 04/02/23   [provider]  Accu-Chek Softclix Lancets lancets daily. 02/11/23   [provider]  Blood Glucose Monitoring Suppl (ACCU-CHEK GUIDE) w/Device KIT USE AS DIRECTEDBh 02/11/23   [provider]  NON FORMULARY Pt uses a cpap nightly    [provider]  Spacer/Aero-Holding Chambers (AEROCHAMBER MV) inhaler Use as instructed  04/12/22   Gladis Leonor HERO, MD    Allergies: Ancef  [cefazolin ] and Other    Review of Systems  Respiratory:  Positive for shortness of breath.   Cardiovascular:  Positive for leg swelling.  All other systems reviewed and are negative.   Updated Vital Signs BP 125/86 (BP Location: Right Arm)   Pulse 69   Resp 19   Ht 5' 6 (1.676 m)   Wt 118.8 kg   SpO2 94%   BMI 42.29 kg/m   Physical Exam Vitals and nursing note reviewed.  Constitutional:      Appearance: He is well-developed. He is obese.  HENT:     Head: Normocephalic and atraumatic.     Mouth/Throat:      Mouth: Mucous membranes are moist.     Pharynx: Oropharynx is clear.  Eyes:     Extraocular Movements: Extraocular movements intact.     Pupils: Pupils are equal, round, and reactive to light.  Cardiovascular:     Rate and Rhythm: Normal rate. Rhythm irregular.  Pulmonary:     Effort: Tachypnea present.     Breath sounds: Rhonchi present.  Abdominal:     General: Bowel sounds are normal.     Palpations: Abdomen is soft.  Musculoskeletal:        General: Normal range of motion.     Cervical back: Normal range of motion and neck supple.     Right lower leg: Edema present.     Left lower leg: Edema present.  Skin:    General: Skin is warm.     Capillary Refill: Capillary refill takes less than 2 seconds.  Neurological:     General: No focal deficit present.     Mental Status: He is alert and oriented to person, place, and time.  Psychiatric:        Mood and Affect: Mood normal.        Behavior: Behavior normal.     (all labs ordered are listed, but only abnormal results are displayed) Labs Reviewed  BASIC METABOLIC PANEL WITH GFR - Abnormal; Notable for the following components:      Result Value   Glucose, Bld 176 (*)    BUN 30 (*)    Creatinine, Ser 1.48 (*)    GFR, Estimated 47 (*)    All other components within normal limits  BRAIN NATRIURETIC PEPTIDE - Abnormal; Notable for the following components:   B Natriuretic Peptide 203.0 (*)    All other components within normal limits  CBC WITH DIFFERENTIAL/PLATELET - Abnormal; Notable for the following components:   RDW 16.7 (*)    All other components within normal limits  RESP PANEL BY RT-PCR (RSV, FLU A&B, COVID)  RVPGX2  TROPONIN I (HIGH SENSITIVITY)  TROPONIN I (HIGH SENSITIVITY)    EKG: EKG Interpretation Date/Time:  Tuesday June 08 2024 16:48:18 EDT Ventricular Rate:  70 PR Interval:    QRS Duration:  83 QT Interval:  405 QTC Calculation: 437 R Axis:   90  Text Interpretation: Atrial fibrillation  Borderline right axis deviation Low voltage, precordial leads Nonspecific T abnrm, anterolateral leads No significant change since last tracing Confirmed by Dean Clarity [phone]) on 06/08/2024 5:00:39 PM  Radiology: [facility] Chest Port 1 View Result Date: 06/08/2024 CLINICAL DATA:  Shortness of breath. EXAM: PORTABLE CHEST 1 VIEW COMPARISON:  Chest radiograph dated 03/12/2024. FINDINGS: Mild cardiomegaly with mild vascular congestion. No focal consolidation, pleural effusion, or pneumothorax. Atherosclerotic calcification of the aorta. No acute osseous pathology.  IMPRESSION: Mild cardiomegaly with mild vascular congestion. Electronically Signed   By: Vanetta Chou M.D.   On: 06/08/2024 17:22     Procedures   Medications Ordered in the ED  furosemide  (LASIX ) injection 40 mg (40 mg Intravenous Given 06/08/24 1707)                                    Medical Decision Making Amount and/or Complexity of Data Reviewed Labs: ordered. Radiology: ordered.  Risk Prescription drug management.   This patient presents to the ED for concern of sob, this involves an extensive number of treatment options, and is a complaint that carries with it a high risk of complications and morbidity.  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  The pertinent results include:  cbc nl, bmp nl other than cr elevated at 1.48 (stable); bnp 203; covid/flu/rsv neg   Imaging Studies ordered:  I ordered imaging studies including cxr  I independently visualized and interpreted imaging which showed Mild cardiomegaly with mild vascular  congestion.  I agree with the radiologist interpretation   Cardiac Monitoring:  The patient was maintained on a cardiac monitor.  I personally viewed and interpreted the cardiac monitored which showed an underlying rhythm of: afib   Medicines ordered and prescription drug management:  I ordered medication including lasix   for sx  Reevaluation of the patient after these medicines showed that the patient improved I have reviewed the patients home medicines and have made adjustments as needed  Problem List / ED Course:  CHF:  pt is feeling much better after 40 mg iv lasix .  He is able to ambulate with O2 sats over 90%.  He is stable for d/c.  I will have him increase his am dose of demadex  for 3 days.  He knows to return if worse.  F/u with pcp.   Reevaluation:  After the interventions noted above, I reevaluated the patient and found that they have :improved   Social Determinants of Health:  Lives at home   Dispostion:  After consideration of the diagnostic results and the patients response to treatment, I feel that the patent would benefit from discharge with outpatient f/u.       Final diagnoses:  Acute on chronic congestive heart failure, unspecified heart failure type [facility])  Peripheral edema    ED Discharge Orders     None          Dean Clarity, MD 06/08/24 [phone]

## 2024-06-08 NOTE — ED Notes (Signed)
 MD was aware that the second Trop was not drawn. MD was okay for patient to be discharge without the second Trop.

## 2024-06-08 NOTE — ED Triage Notes (Signed)
 Pt states he is chronically SOB but today worse, went to try to take a nap and was not able to go to sleep. Denies any CP. C/o bilateral ankle swelling for past 2 weeks.

## 2024-06-08 NOTE — ED Notes (Signed)
 Pt ambulated and vitals are wdl. O2 remained >90.

## 2024-06-08 NOTE — Discharge Instructions (Addendum)
 Increase your torsemide  to 3 pills (60 mg) in the morning and keep 2 pills (40 mg) at night on 7/23, 7/24, 7/25.

## 2024-06-10 DIAGNOSIS — Z6841 Body Mass Index (BMI) 40.0 and over, adult: Secondary | ICD-10-CM | POA: Diagnosis not present

## 2024-06-10 DIAGNOSIS — R609 Edema, unspecified: Secondary | ICD-10-CM | POA: Diagnosis not present

## 2024-06-10 DIAGNOSIS — I509 Heart failure, unspecified: Secondary | ICD-10-CM | POA: Diagnosis not present

## 2024-06-11 DIAGNOSIS — R609 Edema, unspecified: Secondary | ICD-10-CM | POA: Diagnosis not present

## 2024-06-11 DIAGNOSIS — I509 Heart failure, unspecified: Secondary | ICD-10-CM | POA: Diagnosis not present

## 2024-06-11 DIAGNOSIS — Z6839 Body mass index (BMI) 39.0-39.9, adult: Secondary | ICD-10-CM | POA: Diagnosis not present

## 2024-06-14 DIAGNOSIS — R5383 Other fatigue: Secondary | ICD-10-CM | POA: Diagnosis not present

## 2024-06-14 DIAGNOSIS — E1122 Type 2 diabetes mellitus with diabetic chronic kidney disease: Secondary | ICD-10-CM | POA: Diagnosis not present

## 2024-06-14 DIAGNOSIS — N1831 Chronic kidney disease, stage 3a: Secondary | ICD-10-CM | POA: Diagnosis not present

## 2024-06-18 ENCOUNTER — Other Ambulatory Visit: Payer: Self-pay | Admitting: Cardiology

## 2024-06-22 ENCOUNTER — Other Ambulatory Visit: Payer: Self-pay

## 2024-06-22 NOTE — Progress Notes (Signed)
 Specialty Pharmacy Refill Coordination Note  Vincent Cooper is a 83 y.o. male contacted today regarding refills of specialty medication(s) Mepolizumab  (Nucala )   Patient requested Delivery   Delivery date: 06/25/24   Verified address: [address] [place]   Medication will be filled on 06/24/24.

## 2024-06-23 ENCOUNTER — Other Ambulatory Visit: Payer: Self-pay

## 2024-06-25 DIAGNOSIS — Z6841 Body Mass Index (BMI) 40.0 and over, adult: Secondary | ICD-10-CM | POA: Diagnosis not present

## 2024-06-25 DIAGNOSIS — I509 Heart failure, unspecified: Secondary | ICD-10-CM | POA: Diagnosis not present

## 2024-06-25 DIAGNOSIS — R609 Edema, unspecified: Secondary | ICD-10-CM | POA: Diagnosis not present

## 2024-07-05 ENCOUNTER — Encounter: Payer: Self-pay | Admitting: Internal Medicine

## 2024-07-05 ENCOUNTER — Ambulatory Visit: Admitting: Internal Medicine

## 2024-07-05 DIAGNOSIS — G4733 Obstructive sleep apnea (adult) (pediatric): Secondary | ICD-10-CM

## 2024-07-12 ENCOUNTER — Ambulatory Visit: Attending: Cardiology | Admitting: Cardiology

## 2024-07-12 ENCOUNTER — Encounter: Payer: Self-pay | Admitting: Cardiology

## 2024-07-12 VITALS — BP 120/66 | HR 88 | Ht 69.0 in | Wt 269.8 lb

## 2024-07-12 DIAGNOSIS — I10 Essential (primary) hypertension: Secondary | ICD-10-CM

## 2024-07-12 DIAGNOSIS — J449 Chronic obstructive pulmonary disease, unspecified: Secondary | ICD-10-CM

## 2024-07-12 DIAGNOSIS — E785 Hyperlipidemia, unspecified: Secondary | ICD-10-CM | POA: Diagnosis not present

## 2024-07-12 DIAGNOSIS — I5032 Chronic diastolic (congestive) heart failure: Secondary | ICD-10-CM

## 2024-07-12 DIAGNOSIS — E1169 Type 2 diabetes mellitus with other specified complication: Secondary | ICD-10-CM | POA: Diagnosis not present

## 2024-07-12 DIAGNOSIS — I4821 Permanent atrial fibrillation: Secondary | ICD-10-CM | POA: Diagnosis not present

## 2024-07-12 DIAGNOSIS — I2510 Atherosclerotic heart disease of native coronary artery without angina pectoris: Secondary | ICD-10-CM

## 2024-07-12 LAB — LIPID PANEL

## 2024-07-12 MED ORDER — SPIRONOLACTONE 25 MG PO TABS: ORAL_TABLET | ORAL | 3 refills | Status: AC

## 2024-07-12 NOTE — Assessment & Plan Note (Addendum)
 Chronic edema with venous stasis, shortness of breath, and weight gain.  No recent orthopnea or paroxysmal nocturnal dyspnea.  More edematous today but not really having significant CHF exacerbation symptoms. Discussed dietary sodium for fluid management.  Also discussed daily weights with dry weight monitoring and sliding scale diuretic. - Increase spironolactone  to a full 25 mg tablet daily. - Instruct to take three 20 mg torsemide  tablets in the morning and two in the evening if weight increases by three pounds. - If weight increases by five pounds, take three torsemide  tablets in the morning and three in the evening. - Wear support socks to manage edema. - Schedule a day each week to take three torsemide  tablets in the morning and two in the evening to prevent fluid overload. - Educate on reducing dietary sodium intake.  - Continue afterload reduction with losartan 12.5 mg daily low threshold to titrate to 25 mg.  This is in conjunction with continue Lopressor  25 mg twice daily and diltiazem  XT. 360 mg daily.

## 2024-07-12 NOTE — Assessment & Plan Note (Signed)
 Moderate, nonocclusive CAD by Coronary CTA.  Plaque noted but not positive by FFRCT. Not on aspirin because of DOAC On 40 mg rosuvastatin -lipids need closer monitoring.   Check fasting lipid panel Continue combination of beta-blocker and nondihydropyridine calcium  channel blocker: Lopressor  25 mg twice daily and diltiazem  XT 306 mg daily for BP/antianginal benefit Continue losartan 12.5 mg daily for blood pressure with low threshold to titrate to 25 mg.

## 2024-07-12 NOTE — Assessment & Plan Note (Signed)
 It does not seem like his dyspnea is related to his much COPD although Breo gives a component. -Continue Breztri  and albuterol .

## 2024-07-12 NOTE — Patient Instructions (Addendum)
 Medication Instructions:    Increase Spironolactone  25 mg daily  Every Wednesday ( you can pick any  one day you want )  take 3  tablets of Spironolactone   and 2 tablets the afternoon Spironolactone .  Weigh yourself daily - if you are 3 lbs over the follow the above until weight goes back to  original weight.   *If you need a refill on your cardiac medications before your next appointment, please call your pharmacy*   Lab Work:   LIPID Hepatic Panel  If you have labs (blood work) drawn today and your tests are completely normal, you will receive your results only by: MyChart Message (if you have MyChart) OR A paper copy in the mail If you have any lab test that is abnormal or we need to change your treatment, we will call you to review the results.   Testing/Procedures:  Not needed  Follow-Up: At [facility], you and your health needs are our priority.  As part of our continuing mission to provide you with exceptional heart care, we have created designated Provider Care Teams.  These Care Teams include your primary Cardiologist (physician) and Advanced Practice Providers (APPs -  Physician Assistants and Nurse Practitioners) who all work together to provide you with the care you need, when you need it.  We recommend signing up for the patient portal called MyChart.  Sign up information is provided on this After Visit Summary.  MyChart is used to connect with patients for Virtual Visits (Telemedicine).  Patients are able to view lab/test results, encounter notes, upcoming appointments, etc.  Non-urgent messages can be sent to your provider as well.   To learn more about what you can do with MyChart, go to [email or web address].    Your next appointment:   6 month(s)  The format for your next appointment:   In Person  Provider:   Aline Door, PA-C, Damien Braver, NP, or Katlyn West, NP      Then, Alm Clay, MD will plan to see you again in 12 month(s).   Other  Instructions    Wear your support socks every day and take off at night

## 2024-07-12 NOTE — Assessment & Plan Note (Signed)
 The patient understands the need to lose weight with diet and exercise. We have discussed specific strategies for this.  ?  Benefit from GLP-1 agonist given history of pre-DM/DM

## 2024-07-12 NOTE — Assessment & Plan Note (Addendum)
 Current medication includes rosuvastatin  40 mg. Recent cholesterol check results unavailable. - Order fasting lipid panel and liver function tests today.  As of April 2024, LDL was 60.  Need follow-up lipid panel check

## 2024-07-12 NOTE — Progress Notes (Signed)
 Cardiology Office Note:  .   Date:  07/12/2024  ID:  Vincent Cooper, DOB [birth year], MRN [id number] PCP: Atilano Deward ORN, MD  [facility] Providers Cardiologist:  Alm Clay, MD     Chief Complaint  Patient presents with   Follow-up    Delayed annual follow-up; weight is up with more edema and dyspnea   Atrial Fibrillation    Permanent A-fib.  No major issues.    Patient Profile: .     Vincent Cooper is a morbidly obese 83 y.o. male with a PMH noted below who presents here for annual f/u / Hospital F/u. He returns at the request of Sasser, Deward ORN, MD.  Past Medical History: Nonobstructive CAD. Coronary CTA with FFR 03/07/2020: Calcium  score 1651 (83rd percentile).  LM moderate mixed calcified plaque?  50 to 69% mild to moderate 25 to 49% distal LAD moderate to severe plaque 50 to 69% (possible focal stenosis >70%.  LCx with OM1 moderate mixed plaque in proximal LCx and OM roughly 50 to 69%.  FFR showed no flow-limiting stenosis.  Recommend aggressive risk factor modification. 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  (HFpEF) Echo 01/31/2023: EF 65 to 70%.  Mild LAE.  Moderate RAE.  Trivial MR.  Moderate calcification of the aortic valve.  Mild AS, peak gradient 13 mmHg.  Dilated IVC, RA pressure 8 mmHg. H/O PE: CTA chest (PE protocol) 11/06/2019: Partially occlusive thrombus in the posterior right semental and subsegmental pulmonary artery branch and right middle lobe subsegmental branches.  No evidence of right ventricular heart strain. Echo 11/07/2019: EF 60 to 65%.  Mild LAE.  Small pericardial effusion.  Mild aortic valve sclerosis without stenosis.  Moderately elevated pulmonary artery systolic pressure.  Moderately elevated RVSP, RA pressure 8 mmHg. Hypertension. Hyperlipidemia.  Lipid panel 05/13/2022: LDL 54, HDL 87, TG 84, total 179. T2DM. COPD. OSA -on CPAP      Vincent Cooper was last seen on May 28, 2023 as a follow-up visit from an April visit  with Barnie Hila, NP.  He had been admitted to any [facility] room in June for chest pain, ruled out for MI.  Noted to be in A-fib with a rate of 103 bpm.  Ruled out for MI.  Felt to be musculoskeletal.  He then went in with abdominal pain couple days later with concern for cholecystitis.  However HIDA scan did not show any cholecystitis.  When I saw him he is feeling better no chest pain or pressure at rest or exertion.  No heart failure symptoms with exception of stable edema.  Not always taking his torsemide  twice daily.  Noted stable exertional dyspnea.  We discussed taking additional dose of torsemide  for weight gain more than 3 pounds.  He was seen in the [facility] emergency room on for 02/08/2024 for CHF symptoms-more leg edema with orthopnea.  This was diagnosed as systolic congestive heart failure although he has normal EF.  Treated with Lasix  and discharged home after diuresing 1500 mL He was admitted to [facility] from April 25-27, 2025 for cellulitis treated with IV antibiotics.:  Dopplers were negative for DVT or if any evidence of superficial thrombophlebitis. He was seen in the [facility] ER on July 22 for more dyspnea and worsening edema.  Noted that he was still using his CPAP and Demadex .  Not able to fall asleep because he was not to catch his breath.  No chest pain.  He was treated with IV Lasix .  Felt much  better.  Ambulated with O2 sats over 90%.  Discharged home.  Subjective  Discussed the use of AI scribe software for clinical note transcription with the patient, who gave verbal consent to proceed.  History of Present Illness Vincent Cooper is an 83 y/o man with heart failure and atrial fibrillation who presents with concerns about fluid retention and medication management.- Daily weight monitoring with fluctuations; recent increase of nine pounds since April- Recent emergency department visit for fluid retention; treated with Lasix  with significant improvement - Weight can increase by  three to five pounds, prompting adjustment of torsemide  dosage to three tablets in the morning and two in the evening as needed - Shortness of breath primarily when sitting; not present when lying down - Occasional nocturnal dyspnea, but not recently - Pedal Edema - No recent chest pain, pressure, heart racing, or irregular heartbeats  - Currently taking torsemide  (Demadex ) two tablets in the morning and two in the evening, with occasional increase to three tablets in the morning and two in the evening for weight gain.- Does not wear support socks regularly but has used them in the past - Also taking spironolactone  12.5 mg, and losartan 25 mg along with Lopressor  25 twice daily and diltiazem  PICC line.  - Taking Eliquis , but 2.5 mg BID (? Renally adjusted?).  No blood in stools, melena, epistaxis, dizziness, or stroke-like symptoms Taking combination of blood pressure 25 mg twice daily plus diltiazem  CD Pinner 60 mg daily for rate control. - Uses CPAP machine regularly  Peripheral edema and venous insufficiency Still taking rosuvastatin  40 mg daily.  Recent blood work drawn, but is not aware if lipids are assessed..   Cardiovascular ROS: positive for - dyspnea on exertion, edema, orthopnea, paroxysmal nocturnal dyspnea, shortness of breath, and recent exacerbations with Wgt gain.  negative for - chest pain, irregular heartbeat, palpitations, rapid heart rate, or syncope or near syncope, TIA or amaurosis fugax, claudication.  Melena hematochezia and hematuria epistaxis.  ROS:  Review of Systems - Negative except as noted above.    Objective   Current Meds  Medication Sig   albuterol  (VENTOLIN  HFA) 108 (90 Base) MCG/ACT inhaler Inhale 1 puff into the lungs every 6 (six) hours as needed for wheezing or shortness of breath.   apixaban  (ELIQUIS ) 2.5 MG TABS tablet Take 1 tablet (2.5 mg total) by mouth 2 (two) times daily.   Blood Glucose Monitoring Suppl (ACCU-CHEK GUIDE) w/Device KIT USE AS  DIRECTEDBh   budeson-glycopyrrolate -formoterol  (BREZTRI  AEROSPHERE) 160-9-4.8 MCG/ACT AERO inhaler Inhale 2 puffs into the lungs in the morning and at bedtime.   diltiazem  (CARDIZEM  CD) 360 MG 24 hr capsule Take 1 capsule (360 mg total) by mouth daily.   hydroxypropyl methylcellulose / hypromellose (ISOPTO TEARS / GONIOVISC) 2.5 % ophthalmic solution Place 1 drop into both eyes as needed for dry eyes.   Mepolizumab  (NUCALA ) 100 MG/ML SOAJ Inject 1 mL (100 mg total) into the skin every 28 (twenty-eight) days.   metFORMIN  (GLUCOPHAGE -XR) 500 MG 24 hr tablet Take 1 tablet (500 mg total) by mouth every morning.   metoprolol  tartrate (LOPRESSOR ) 25 MG tablet Take 1 tablet (25 mg total) by mouth 2 (two) times daily.   nitroGLYCERIN  (NITROSTAT ) 0.4 MG SL tablet Place 1 tablet (0.4 mg total) under the tongue every 5 (five) minutes as needed for chest pain.   NON FORMULARY Pt uses a cpap nightly   potassium chloride  SA (KLOR-CON  M) 20 MEQ tablet Take 20 mEq by mouth 2 (two) times daily.  rosuvastatin  (CRESTOR ) 40 MG tablet TAKE 1 TABLET BY MOUTH ONCE DAILY.   Spacer/Aero-Holding Chambers (AEROCHAMBER MV) inhaler Use as instructed   spironolactone  (ALDACTONE ) 25 MG tablet Take 0.5 tablets (12.5 mg total) by mouth daily.   tamsulosin  (FLOMAX ) 0.4 MG CAPS capsule Take 1 capsule (0.4 mg total) by mouth at bedtime.   torsemide  (DEMADEX ) 20 MG tablet Take 2 tablets (40 mg total) by mouth 2 (two) times daily.   Studies Reviewed: SABRA   EKG Interpretation Date/Time:  Monday July 12 2024 08:12:13 EDT Ventricular Rate:  91 PR Interval:    QRS Duration:  82 QT Interval:  354 QTC Calculation: 435 R Axis:   22  Text Interpretation: Atrial fibrillation with premature ventricular or aberrantly conducted complexes Nonspecific ST and T wave abnormality When compared with ECG of 08-Jun-2024 16:48, No significant change since last tracing Confirmed by Anner Lenis ([id number]) on 07/12/2024 8:17:51 AM    Lab Results   Component Value Date   NA 138 06/08/2024   K 4.4 06/08/2024   CREATININE 1.48 (H) 06/08/2024   GFRNONAA 47 (L) 06/08/2024   GLUCOSE 176 (H) 06/08/2024   Lab Results  Component Value Date   CHOL 161 02/20/2023   HDL 82 02/20/2023   LDLCALC 60 02/20/2023   TRIG 109 02/20/2023   CHOLHDL 2.0 02/20/2023   Lab Results  Component Value Date   HGBA1C 5.4 03/13/2024       Latest Ref Rng & Units 06/08/2024    4:56 PM 03/13/2024    4:22 AM 03/12/2024    8:17 PM  CBC  WBC 4.0 - 10.5 K/uL 9.2  7.3  9.6   Hemoglobin 13.0 - 17.0 g/dL 85.2  87.2  85.6   Hematocrit 39.0 - 52.0 % 43.4  38.0  42.3   Platelets 150 - 400 K/uL 171  194  213    Echo: Normal LV function with EF 65 to 70%.  No RWMA.  Unable to assess diastolic parameters due to atrial fibrillation.  Normal RV.  Mild LA and moderate RA dilation.  MV not well-visualized.  No MS with trivial MR.  Moderate AoV calcification with mild stenosis.  Mean AVG 13 mmHg.  RAP 8 mmHg.  (01/31/2023) Coronary CTA :: CAC 1651.  Diffuse mild to moderate mixed plaque (25-49%) prox RCA & 50 -69% mid-dist RCA (FFR ct-proximal 0.99, mid 0.85, distal 0.81-borderline).  50-69% calcified ostial LM (FFRCT 1.0), distal LM 25-49% (moderate calcified).  Prox-MidLAD Long diffuse moderate-severe calcified plaque (50-69%) & possible Focal 70%, distal LAD small (FFRct - Prox 0.95, mid 0.88, dist 0.88). Small, non-dominant LCx w/ 1 OM - proxLCx-OM2 50-69% mixed plaque (FFRct LCx 0.94, OM1 0.91) . (03/07/2020)  Risk Assessment/Calculations:    CHA2DS2-VASc Score = 6   This indicates a 9.7% annual risk of stroke. The patient's score is based upon: CHF History: 1 HTN History: 1 Diabetes History: 1 Stroke History: 0 Vascular Disease History: 1 Age Score: 2 Gender Score: 0            Physical Exam:   VS:  BP 120/66 (BP Location: Left Arm, Patient Position: Sitting)   Pulse 88   Ht 5' 9 (1.753 m)   Wt 269 lb 12.8 oz (122.4 kg)   SpO2 96%   BMI 39.84 kg/m     Wt Readings from Last 3 Encounters:  07/12/24 269 lb 12.8 oz (122.4 kg)  06/08/24 262 lb (118.8 kg)  03/12/24 260 lb 2.3 oz (118 kg)  GEN: Well nourished, well groomed in no acute distress; borderline morbidly obese  NECK: No JVD; No carotid bruits CARDIAC: Distant but normal S1 and S2; RRR, soft/harsh 1/6 SEM at RUSB.  Otherwise no murmurs, rubs, gallops RESPIRATORY:  Clear to auscultation without rales, wheezing or rhonchi ; nonlabored, good air movement. ABDOMEN: Soft, non-tender, non-distended EXTREMITIES: 2+ bilateral lower extremity edema.  Diffuse venous stasis changes with brawny discoloration, dry skin and various healed wounds.; No deformity      ASSESSMENT AND PLAN: .    Problem List Items Addressed This Visit       Cardiology Problems   Chronic diastolic HF (heart failure) (HCC) - Primary (Chronic)   Chronic edema with venous stasis, shortness of breath, and weight gain.  No recent orthopnea or paroxysmal nocturnal dyspnea.  More edematous today but not really having significant CHF exacerbation symptoms. Discussed dietary sodium for fluid management.  Also discussed daily weights with dry weight monitoring and sliding scale diuretic. - Increase spironolactone  to a full 25 mg tablet daily. - Instruct to take three 20 mg torsemide  tablets in the morning and two in the evening if weight increases by three pounds. - If weight increases by five pounds, take three torsemide  tablets in the morning and three in the evening. - Wear support socks to manage edema. - Schedule a day each week to take three torsemide  tablets in the morning and two in the evening to prevent fluid overload. - Educate on reducing dietary sodium intake.  - Continue afterload reduction with losartan 12.5 mg daily low threshold to titrate to 25 mg.  This is in conjunction with continue Lopressor  25 mg twice daily and diltiazem  XT. 360 mg daily.      Relevant Medications   spironolactone  (ALDACTONE )  25 MG tablet   Coronary artery disease, non-occlusive (Chronic)   Moderate, nonocclusive CAD by Coronary CTA.  Plaque noted but not positive by FFRCT. Not on aspirin because of DOAC On 40 mg rosuvastatin -lipids need closer monitoring.   Check fasting lipid panel Continue combination of beta-blocker and nondihydropyridine calcium  channel blocker: Lopressor  25 mg twice daily and diltiazem  XT 306 mg daily for BP/antianginal benefit Continue losartan 12.5 mg daily for blood pressure with low threshold to titrate to 25 mg.      Relevant Medications   spironolactone  (ALDACTONE ) 25 MG tablet   Other Relevant Orders   Hepatic function panel   Lipid panel   Essential hypertension (Chronic)   Blood pressure well-controlled on current medications. No recent dizziness or lightheadedness. With exception of increasing spironolactone  to 25 mg daily, will continue losartan at current dose of 25 mg daily, Lopressor  25 mg twice daily and diltiazem  XT 360 mg daily.      Relevant Medications   spironolactone  (ALDACTONE ) 25 MG tablet   Other Relevant Orders   EKG 12-Lead (Completed)   Hyperlipidemia with target LDL less than 70 (Chronic)   Current medication includes rosuvastatin  40 mg. Recent cholesterol check results unavailable. - Order fasting lipid panel and liver function tests today.  As of April 2024, LDL was 60.  Need follow-up lipid panel check      Relevant Medications   spironolactone  (ALDACTONE ) 25 MG tablet   Permanent atrial fibrillation (HCC): CHA2DS2Vasc = 5. Eliquis  (Chronic)   Permanent rate controlled atrial fibrillation. Essentially unaware of being in A-fib. No recent chest pain, palpitations, or stroke-like symptoms.  Continue combination of diltiazem  XT 360 mg daily along with Lopressor  25 mg twice daily for rate control  For now continue Eliquis  2.5 mg twice daily but will review with pharmacy team to determine the appropriate dose based on renal function.       Relevant  Medications   spironolactone  (ALDACTONE ) 25 MG tablet     Other   COPD (chronic obstructive pulmonary disease) (HCC) (Chronic)   It does not seem like his dyspnea is related to his much COPD although Breo gives a component. -Continue Breztri  and albuterol .      Morbid obesity (HCC) (Chronic)   The patient understands the need to lose weight with diet and exercise. We have discussed specific strategies for this.  ?  Benefit from GLP-1 agonist given history of pre-DM/DM              Follow-Up: Return in about 6 months (around 01/12/2025).  I spent 49 minutes in the care of [facility] today including reviewing labs (from epic 1 minute), reviewing outside labs from [facility] Care Everywhere (1 minute), reviewing studies (3 minutes reviewing echo and Coronary CTA), face to face time discussing treatment options (23 minutes), reviewing records from previous clinic notes (8 minutes), 13 minutes dictating, and documenting in the encounter.      Signed, Alm MICAEL Clay, MD, MS Alm Clay, M.D., M.S. Interventional Chartered certified accountant  Pager # [phone]

## 2024-07-12 NOTE — Assessment & Plan Note (Signed)
 Permanent rate controlled atrial fibrillation. Essentially unaware of being in A-fib. No recent chest pain, palpitations, or stroke-like symptoms.  Continue combination of diltiazem  XT 360 mg daily along with Lopressor  25 mg twice daily for rate control For now continue Eliquis  2.5 mg twice daily but will review with pharmacy team to determine the appropriate dose based on renal function.

## 2024-07-12 NOTE — Assessment & Plan Note (Signed)
 Blood pressure well-controlled on current medications. No recent dizziness or lightheadedness. With exception of increasing spironolactone  to 25 mg daily, will continue losartan at current dose of 25 mg daily, Lopressor  25 mg twice daily and diltiazem  XT 360 mg daily.

## 2024-07-13 ENCOUNTER — Ambulatory Visit: Payer: Self-pay | Admitting: Cardiology

## 2024-07-13 LAB — LIPID PANEL
Cholesterol, Total: 164 mg/dL (ref 100–199)
HDL: 91 mg/dL (ref >39)
LDL CALC COMMENT:: 1.8 ratio (ref 0.0–5.0)
LDL Chol Calc (NIH): 55 mg/dL (ref 0–99)
Triglycerides: 103 mg/dL (ref 0–149)
VLDL Cholesterol Cal: 18 mg/dL (ref 5–40)

## 2024-07-13 LAB — HEPATIC FUNCTION PANEL
ALT: 23 IU/L (ref 0–44)
AST: 17 IU/L (ref 0–40)
Albumin: 3.9 g/dL (ref 3.7–4.7)
Alkaline Phosphatase: 65 IU/L (ref 44–121)
Bilirubin Total: 1.5 mg/dL — AB (ref 0.0–1.2)
Bilirubin, Direct: 0.6 mg/dL — AB (ref 0.00–0.40)
Total Protein: 5.9 g/dL — AB (ref 6.0–8.5)

## 2024-07-21 ENCOUNTER — Other Ambulatory Visit (HOSPITAL_COMMUNITY): Payer: Self-pay

## 2024-07-23 ENCOUNTER — Other Ambulatory Visit: Payer: Self-pay

## 2024-07-23 NOTE — Progress Notes (Signed)
 Specialty Pharmacy Refill Coordination Note  Vincent Cooper is a 83 y.o. male contacted today regarding refills of specialty medication(s) Mepolizumab  (Nucala )   Patient requested Delivery   Delivery date: 07/27/24   Verified address: [address] [place]   Medication will be filled on 07/26/24.

## 2024-08-13 ENCOUNTER — Other Ambulatory Visit: Payer: Self-pay

## 2024-08-13 ENCOUNTER — Other Ambulatory Visit: Payer: Self-pay | Admitting: Internal Medicine

## 2024-08-13 DIAGNOSIS — J4489 Other specified chronic obstructive pulmonary disease: Secondary | ICD-10-CM

## 2024-08-16 ENCOUNTER — Other Ambulatory Visit: Payer: Self-pay

## 2024-08-16 NOTE — Telephone Encounter (Signed)
 Pt requesting refill of specialty medication - routing to Rx team to advise.

## 2024-08-17 ENCOUNTER — Other Ambulatory Visit (HOSPITAL_COMMUNITY): Payer: Self-pay

## 2024-08-17 ENCOUNTER — Other Ambulatory Visit: Payer: Self-pay

## 2024-08-17 MED ORDER — NUCALA 100 MG/ML ~~LOC~~ SOAJ
100.0000 mg | SUBCUTANEOUS | 1 refills | Status: AC
  Filled 2024-08-17: qty 3, 84d supply, fill #0
  Filled 2024-08-18: qty 1, 28d supply, fill #0
  Filled 2024-09-16: qty 1, 28d supply, fill #1
  Filled 2024-10-13: qty 1, 28d supply, fill #2
  Filled 2024-11-09: qty 1, 28d supply, fill #3
  Filled 2024-12-08: qty 1, 28d supply, fill #4

## 2024-08-17 NOTE — Telephone Encounter (Signed)
 Refill sent for NUCALA  to [facility]: [phone]   Dose: 100mg  [place] every 28 days   Last OV: 03/02/24 Provider: Dr. Meade  Next OV: 11/04/24  Aleck Puls, PharmD, BCPS Clinical Pharmacist  [facility]

## 2024-08-18 ENCOUNTER — Other Ambulatory Visit: Payer: Self-pay

## 2024-08-18 NOTE — Progress Notes (Signed)
 Specialty Pharmacy Refill Coordination Note  Vincent Cooper is a 83 y.o. male contacted today regarding refills of specialty medication(s) Mepolizumab  (Nucala )   Patient requested Delivery   Delivery date: 08/27/24   Verified address: [address] [place]   Medication will be filled on 10.09.25.

## 2024-08-19 ENCOUNTER — Ambulatory Visit (INDEPENDENT_AMBULATORY_CARE_PROVIDER_SITE_OTHER): Admitting: Nurse Practitioner

## 2024-08-19 ENCOUNTER — Encounter: Payer: Self-pay | Admitting: Nurse Practitioner

## 2024-08-19 VITALS — BP 122/82 | HR 60 | Ht 69.0 in | Wt 256.0 lb

## 2024-08-19 DIAGNOSIS — E782 Mixed hyperlipidemia: Secondary | ICD-10-CM

## 2024-08-19 DIAGNOSIS — I10 Essential (primary) hypertension: Secondary | ICD-10-CM | POA: Diagnosis not present

## 2024-08-19 DIAGNOSIS — E1122 Type 2 diabetes mellitus with diabetic chronic kidney disease: Secondary | ICD-10-CM

## 2024-08-19 DIAGNOSIS — N1831 Chronic kidney disease, stage 3a: Secondary | ICD-10-CM

## 2024-08-19 DIAGNOSIS — Z7984 Long term (current) use of oral hypoglycemic drugs: Secondary | ICD-10-CM | POA: Diagnosis not present

## 2024-08-19 MED ORDER — METFORMIN HCL ER 500 MG PO TB24: 500.0000 mg | ORAL_TABLET | Freq: Every morning | ORAL | 3 refills | Status: AC

## 2024-08-19 NOTE — Progress Notes (Signed)
 Endocrinology Follow Up Note       08/19/2024, 2:47 PM   Subjective:    Patient ID: Vincent Cooper, male    DOB: 09/16/1941.  Vincent Cooper is being seen in follow up after being seen in consultation for management of currently uncontrolled symptomatic diabetes requested by  Trudy Vaughn FALCON, MD.   Past Medical History:  Diagnosis Date   CHF (congestive heart failure) (HCC)    Chronic gout    COPD (chronic obstructive pulmonary disease) (HCC)    Oxygen  dependent   Coronary artery disease due to calcified coronary lesion 03/08/2020   CORONARY CTA: Cor Ca++ Score 1651 !!: ~ 50% LM (CTFFR 1 - NS). Large Dom RCA-<PDA-PAV/PL - diffuse mild-mod plaque: prox (25-49%), mid (50-69%) CTFFR (p 0.99, m 0.85, d 0.81 - NS).  Med-Size LAD - prox-mid long diffuse Mod-Severe Ca++ plaque (~50-69%, ?>70%): CTFFR p 0.95, m 0.88, d 0.86 -NS.  CTFFR LCx 0.94, OM1 0.90 - NS. (NS=Not Significant).   Diabetes mellitus type II, non insulin  dependent (HCC)    Hypertension    Morbid obesity (HCC)    BMI of 38.5 with multiple risk factors.   OSA on CPAP    Pulmonary emboli (HCC) 10/2019   Chest CTA-4 Phadke opacification of main PA but there is partially occlusive main posterior RLL and segmental/segmental branches.  Small thrombus noted in the anterior right middle lobe.  No RV strain.  Scattered aortic atherosclerosis involving great vessels.  Coronary calcification noted.   RLS (restless legs syndrome)     Past Surgical History:  Procedure Laterality Date   BOWEL RESECTION N/A 10/18/2020   Procedure: SMALL BOWEL RESECTION;  Surgeon: Kallie Manuelita BROCKS, MD;  Location: [facility];  Service: General;  Laterality: N/A;   Cataract surgery Right    COLONOSCOPY WITH PROPOFOL  N/A 09/27/2020   Procedure: COLONOSCOPY WITH PROPOFOL ;  Surgeon: Golda Claudis PENNER, MD;  Location: [facility];  Service: Endoscopy;  Laterality: N/A;  1:15   COLOSTOMY REVERSAL  N/A 10/18/2020   Procedure: COLOSTOMY REVERSAL;  Surgeon: Kallie Manuelita BROCKS, MD;  Location: [facility];  Service: General;  Laterality: N/A;   LAPAROTOMY N/A 06/21/2020   Procedure: EXPLORATORY LAPAROTOMY,  bowel resection, creation ostomy;  Surgeon: Kallie Manuelita BROCKS, MD;  Location: [facility];  Service: General;  Laterality: N/A;   POLYPECTOMY  09/27/2020   Procedure: POLYPECTOMY;  Surgeon: Golda Claudis PENNER, MD;  Location: [facility];  Service: Endoscopy;;   TRANSTHORACIC ECHOCARDIOGRAM  01/31/2023   Normal LV size and function with EF 65 to 70%.  No RWMA.  Unable to assess diastolic function.  Normal RV size and function.  Normal RVP and mildly elevated RAP.  Mild LA dilation and moderate RA dilation.  Moderate AoV calcification with mild AAS (mean AVG 13 mmHg). => Mild AS now present   VENTRAL HERNIA REPAIR N/A 09/02/2022   Procedure: HERNIA REPAIR VENTRAL ADULT WITH  MESH;  Surgeon: Kallie Manuelita BROCKS, MD;  Location: [facility];  Service: General;  Laterality: N/A;    Social History   Socioeconomic History   Marital status: Legally Separated    Spouse name: Not on file   Number of children: 3  Years of education: Not on file   Highest education level: Not on file  Occupational History   Occupation: retired  Tobacco Use   Smoking status: Former    Current packs/day: 0.00    Average packs/day: 2.0 packs/day for 55.0 years (110.0 ttl pk-yrs)    Types: Cigarettes    Start date: 11/18/1946    Quit date: 11/18/2001    Years since quitting: 22.7   Smokeless tobacco: Former  Building services engineer status: Never Used  Substance and Sexual Activity   Alcohol  use: Yes    Comment: sometimes   Drug use: Never   Sexual activity: Not Currently  Other Topics Concern   Not on file  Social History Narrative   Not on file   Social Drivers of Health   Financial Resource Strain: Not on file  Food Insecurity: No Food Insecurity (03/12/2024)   Hunger Vital Sign    Worried About Running Out of Food in  the Last Year: Never true    Ran Out of Food in the Last Year: Never true  Transportation Needs: No Transportation Needs (03/12/2024)   PRAPARE - Administrator, Civil Service (Medical): No    Lack of Transportation (Non-Medical): No  Physical Activity: Not on file  Stress: Not on file  Social Connections: Socially Isolated (03/12/2024)   Social Connection and Isolation Panel    Frequency of Communication with Friends and Family: More than three times a week    Frequency of Social Gatherings with Friends and Family: Once a week    Attends Religious Services: Never    Database administrator or Organizations: No    Attends Engineer, structural: Never    Marital Status: Divorced    Family History  Problem Relation Age of Onset   Colon cancer Mother    Emphysema Father        smoked   Diverticulitis Sister    Diabetes Brother    Diabetes Brother    Heart Problems Maternal Grandmother    Breast cancer Daughter    Lupus Son    CAD Neg Hx     Outpatient Encounter Medications as of 08/19/2024  Medication Sig   ACCU-CHEK GUIDE test strip USE TO TEST BLOOD SUGAREONCE DAILY.   Accu-Chek Softclix Lancets lancets daily.   albuterol  (VENTOLIN  HFA) 108 (90 Base) MCG/ACT inhaler Inhale 1 puff into the lungs every 6 (six) hours as needed for wheezing or shortness of breath.   apixaban  (ELIQUIS ) 2.5 MG TABS tablet Take 1 tablet (2.5 mg total) by mouth 2 (two) times daily.   Blood Glucose Monitoring Suppl (ACCU-CHEK GUIDE) w/Device KIT USE AS DIRECTEDBh   budeson-glycopyrrolate -formoterol  (BREZTRI  AEROSPHERE) 160-9-4.8 MCG/ACT AERO inhaler Inhale 2 puffs into the lungs in the morning and at bedtime.   diltiazem  (CARDIZEM  CD) 360 MG 24 hr capsule Take 1 capsule (360 mg total) by mouth daily.   hydroxypropyl methylcellulose / hypromellose (ISOPTO TEARS / GONIOVISC) 2.5 % ophthalmic solution Place 1 drop into both eyes as needed for dry eyes.   Mepolizumab  (NUCALA ) 100 MG/ML SOAJ  Inject 1 mL (100 mg total) into the skin every 28 (twenty-eight) days.   metoprolol  tartrate (LOPRESSOR ) 25 MG tablet Take 1 tablet (25 mg total) by mouth 2 (two) times daily.   nitroGLYCERIN  (NITROSTAT ) 0.4 MG SL tablet Place 1 tablet (0.4 mg total) under the tongue every 5 (five) minutes as needed for chest pain.   NON FORMULARY Pt uses a cpap nightly  potassium chloride  SA (KLOR-CON  M) 20 MEQ tablet Take 20 mEq by mouth 2 (two) times daily.   rosuvastatin  (CRESTOR ) 40 MG tablet TAKE 1 TABLET BY MOUTH ONCE DAILY.   Spacer/Aero-Holding Chambers (AEROCHAMBER MV) inhaler Use as instructed   spironolactone  (ALDACTONE ) 25 MG tablet Take 25 mg tablet daily . May take an additional  50 mg (2 tablets)  - 75 mg ( 3 tablets) as need for increase weight greater than 3 lbs.   tamsulosin  (FLOMAX ) 0.4 MG CAPS capsule Take 1 capsule (0.4 mg total) by mouth at bedtime.   torsemide  (DEMADEX ) 20 MG tablet Take 2 tablets (40 mg total) by mouth 2 (two) times daily.   [DISCONTINUED] metFORMIN  (GLUCOPHAGE -XR) 500 MG 24 hr tablet Take 1 tablet (500 mg total) by mouth every morning.   losartan (COZAAR) 25 MG tablet Take 12.5 mg by mouth daily. (Patient not taking: Reported on 08/19/2024)   metFORMIN  (GLUCOPHAGE -XR) 500 MG 24 hr tablet Take 1 tablet (500 mg total) by mouth every morning.   No facility-administered encounter medications on file as of 08/19/2024.    ALLERGIES: Allergies  Allergen Reactions   Ancef  [Cefazolin ] Itching and Rash   Other Other (See Comments)    Patient reports he was allergic to something in an IV he was given but does not know what the substance was. As of 01/19/2020     VACCINATION STATUS: Immunization History  Administered Date(s) Administered   Fluad Trivalent(High Dose 65+) 07/30/2023   Influenza Inj Mdck Quad With Preservative 08/28/2018   Influenza,inj,quad, With Preservative 08/29/2015    Diabetes He presents for his follow-up diabetic visit. He has type 2 diabetes  mellitus. Onset time: diagnosed at approx age of 60. His disease course has been stable. There are no hypoglycemic associated symptoms. Associated symptoms include fatigue and foot paresthesias. There are no hypoglycemic complications. Diabetic complications include heart disease (CHF, CAD with Afib), nephropathy and peripheral neuropathy. Risk factors for coronary artery disease include diabetes mellitus, dyslipidemia, family history, obesity, male sex, hypertension and sedentary lifestyle. Current diabetic treatment includes diet and oral agent (monotherapy). He is compliant with treatment most of the time. His weight is fluctuating minimally. He is following a generally healthy diet. When asked about meal planning, he reported none. He has not had a previous visit with a dietitian. He rarely participates in exercise. His home blood glucose trend is fluctuating minimally. His breakfast blood glucose range is generally 130-140 mg/dl. (He presents today with his logs showing at goal glycemic profile overall.  His POCT A1c today is 6%, increasing slightly from last visit of 5.7%.  He denies any hypoglycemia.  He has been working to eat more fibrous foods, less breads.) An ACE inhibitor/angiotensin II receptor blocker is not being taken. He does not see a podiatrist.Eye exam is not current.     Review of systems  Constitutional: + Minimally fluctuating body weight,  current Body mass index is 37.8 kg/m. , no fatigue, no subjective hyperthermia, no subjective hypothermia Eyes: no blurry vision, no xerophthalmia ENT: no sore throat, no nodules palpated in throat, no dysphagia/odynophagia, no hoarseness Cardiovascular: no chest pain, no shortness of breath, no palpitations, ++ leg swelling Respiratory: no cough, no shortness of breath Gastrointestinal: no nausea/vomiting/diarrhea Musculoskeletal: no muscle/joint aches Skin: no rashes, no hyperemia, multiple bruises Neurological: no tremors, no numbness, no  tingling, no dizziness Psychiatric: no depression, no anxiety  Objective:     BP 122/82 (BP Location: Left Arm, Patient Position: Sitting)   Pulse 60  Ht 5' 9 (1.753 m)   Wt 256 lb (116.1 kg)   BMI 37.80 kg/m   Wt Readings from Last 3 Encounters:  08/19/24 256 lb (116.1 kg)  07/12/24 269 lb 12.8 oz (122.4 kg)  06/08/24 262 lb (118.8 kg)     BP Readings from Last 3 Encounters:  08/19/24 122/82  07/12/24 120/66  06/08/24 125/86     Physical Exam- Limited  Constitutional:  Body mass index is 37.8 kg/m. , not in acute distress, normal state of mind Eyes:  EOMI, no exophthalmos Musculoskeletal: no gross deformities, strength intact in all four extremities, no gross restriction of joint movements Skin:  no rashes, no hyperemia, thin brittle skin with scattered bruises (on blood thinner) Neurological: no tremor with outstretched hands  Diabetic Foot Exam - Simple   No data filed      CMP ( most recent) CMP     Component Value Date/Time   NA 138 06/08/2024 1656   NA 134 02/20/2023 0855   K 4.4 06/08/2024 1656   CL 98 06/08/2024 1656   CO2 25 06/08/2024 1656   GLUCOSE 176 (H) 06/08/2024 1656   BUN 30 (H) 06/08/2024 1656   BUN 28 (H) 02/20/2023 0855   CREATININE 1.48 (H) 06/08/2024 1656   CREATININE 1.16 04/12/2022 1618   CALCIUM  9.0 06/08/2024 1656   PROT 5.9 (L) 07/12/2024 0912   ALBUMIN  3.9 07/12/2024 0912   AST 17 07/12/2024 0912   ALT 23 07/12/2024 0912   ALKPHOS 65 07/12/2024 0912   BILITOT 1.5 (H) 07/12/2024 0912   GFRNONAA 47 (L) 06/08/2024 1656   GFRAA 39 (L) 06/29/2020 0348     Diabetic Labs (most recent): Lab Results  Component Value Date   HGBA1C 5.4 03/13/2024   HGBA1C 6.2 (A) 10/20/2023   HGBA1C 5.5 06/17/2023   MICROALBUR 30mg /L 10/20/2023     Lipid Panel ( most recent) Lipid Panel     Component Value Date/Time   CHOL 164 07/12/2024 0912   TRIG 103 07/12/2024 0912   HDL 91 07/12/2024 0912   CHOLHDL 1.8 07/12/2024 0912   LDLCALC  55 07/12/2024 0912   LABVLDL 18 07/12/2024 0912      Lab Results  Component Value Date   TSH 3.583 01/30/2023   TSH 4.978 (H) 10/20/2020   FREET4 1.03 10/21/2020           Assessment & Plan:   1) Type 2 diabetes mellitus with stage 3a chronic kidney disease, without long-term current use of insulin  (HCC)  He presents today with his logs showing at goal glycemic profile overall.  His POCT A1c today is 6%, increasing slightly from last visit of 5.7%.  He denies any hypoglycemia.  He has been working to eat more fibrous foods, less breads.  - Vincent Cooper has currently uncontrolled symptomatic type 2 DM since 83 years of age.   -Recent labs reviewed.  - I had a long discussion with him about the progressive nature of diabetes and the pathology behind its complications. -his diabetes is complicated by CKD stage 3, neuropathy and he remains at a high risk for more acute and chronic complications which include CAD, CVA, CKD, retinopathy, and neuropathy. These are all discussed in detail with him.  The following Lifestyle Medicine recommendations according to American College of Lifestyle Medicine [facility]) were discussed and offered to patient and he agrees to start the journey:  A. Whole Foods, Plant-based plate comprising of fruits and vegetables, plant-based proteins, whole-grain carbohydrates was discussed in  detail with the patient.   A list for source of those nutrients were also provided to the patient.  Patient will use only water  or unsweetened tea for hydration. B.  The need to stay away from risky substances including alcohol , smoking; obtaining 7 to 9 hours of restorative sleep, at least 150 minutes of moderate intensity exercise weekly, the importance of healthy social connections,  and stress reduction techniques were discussed. C.  A full color page of  Calorie density of various food groups per pound showing examples of each food groups was provided to the patient.  -  Nutritional counseling repeated at each appointment due to patients tendency to fall back in to old habits.  - The patient admits there is a room for improvement in their diet and drink choices. -  Suggestion is made for the patient to avoid simple carbohydrates from their diet including Cakes, Sweet Desserts / Pastries, Ice Cream, Soda (diet and regular), Sweet Tea, Candies, Chips, Cookies, Sweet Pastries, Store Bought Juices, Alcohol  in Excess of 1-2 drinks a day, Artificial Sweeteners, Coffee Creamer, and Sugar-free Products. This will help patient to have stable blood glucose profile and potentially avoid unintended weight gain.   - I encouraged the patient to switch to unprocessed or minimally processed complex starch and increased protein intake (animal or plant source), fruits, and vegetables.   - Patient is advised to stick to a routine mealtimes to eat 3 meals a day and avoid unnecessary snacks (to snack only to correct hypoglycemia).  - I have approached him with the following individualized plan to manage his diabetes and patient agrees:   -He is advised to continue Metformin  500 mg ER daily after breakfast (to minimize GI upset).    -he can take a break from routine glucose monitoring given his safe medication regimen.   - Adjustment parameters are given to him for hypo and hyperglycemia in writing.  - Specific targets for  A1c; LDL, HDL, and Triglycerides were discussed with the patient.  2) Blood Pressure /Hypertension:  his blood pressure is controlled to target.   he is advised to continue his current medications as prescribed by his PCP.  3) Lipids/Hyperlipidemia:    Review of his recent lipid panel from 07/12/24 showed controlled LDL at 55 .  he is advised to continue Crestor  40 mg daily at bedtime.  Side effects and precautions discussed with him.  4)  Weight/Diet:  his Body mass index is 37.8 kg/m.  -  clearly complicating his diabetes care.   he is a candidate for  weight loss. I discussed with him the fact that loss of 5 - 10% of his  current body weight will have the most impact on his diabetes management.  Exercise, and detailed carbohydrates information provided  -  detailed on discharge instructions.  5) Chronic Care/Health Maintenance: -he is not on ACEI/ARB and is on Statin medications and is encouraged to initiate and continue to follow up with Ophthalmology, Dentist, Podiatrist at least yearly or according to recommendations, and advised to stay away from smoking. I have recommended yearly flu vaccine and pneumonia vaccine at least every 5 years; moderate intensity exercise for up to 150 minutes weekly; and sleep for at least 7 hours a day.  - he is advised to maintain close follow up with Trudy Vaughn FALCON, MD for primary care needs, as well as his other providers for optimal and coordinated care.      I spent  29  minutes in  the care of the patient today including review of labs from CMP, Lipids, Thyroid Function, Hematology (current and previous including abstractions from other facilities); face-to-face time discussing  his blood glucose readings/logs, discussing hypoglycemia and hyperglycemia episodes and symptoms, medications doses, his options of short and long term treatment based on the latest standards of care / guidelines;  discussion about incorporating lifestyle medicine;  and documenting the encounter. Risk reduction counseling performed per USPSTF guidelines to reduce obesity and cardiovascular risk factors.     Please refer to Patient Instructions for Blood Glucose Monitoring and Insulin /Medications Dosing Guide  in media tab for additional information. Please  also refer to  Patient Self Inventory in the Media  tab for reviewed elements of pertinent patient history.  Vincent Cooper participated in the discussions, expressed understanding, and voiced agreement with the above plans.  All questions were answered to his satisfaction. he is  encouraged to contact clinic should he have any questions or concerns prior to his return visit.     Follow up plan: - Return in about 6 months (around 02/17/2025) for Diabetes F/U with A1c in office, No previsit labs.   Benton Rio, [facility] [address] [place], [place] [address] Phone: [phone] Fax: [phone]  08/19/2024, 2:47 PM

## 2024-08-25 DIAGNOSIS — E1122 Type 2 diabetes mellitus with diabetic chronic kidney disease: Secondary | ICD-10-CM | POA: Diagnosis not present

## 2024-08-25 DIAGNOSIS — E7849 Other hyperlipidemia: Secondary | ICD-10-CM | POA: Diagnosis not present

## 2024-08-25 DIAGNOSIS — N1831 Chronic kidney disease, stage 3a: Secondary | ICD-10-CM | POA: Diagnosis not present

## 2024-09-01 DIAGNOSIS — R609 Edema, unspecified: Secondary | ICD-10-CM | POA: Diagnosis not present

## 2024-09-01 DIAGNOSIS — E782 Mixed hyperlipidemia: Secondary | ICD-10-CM | POA: Diagnosis not present

## 2024-09-01 DIAGNOSIS — I509 Heart failure, unspecified: Secondary | ICD-10-CM | POA: Diagnosis not present

## 2024-09-01 DIAGNOSIS — Z6837 Body mass index (BMI) 37.0-37.9, adult: Secondary | ICD-10-CM | POA: Diagnosis not present

## 2024-09-01 DIAGNOSIS — Z23 Encounter for immunization: Secondary | ICD-10-CM | POA: Diagnosis not present

## 2024-09-01 DIAGNOSIS — J449 Chronic obstructive pulmonary disease, unspecified: Secondary | ICD-10-CM | POA: Diagnosis not present

## 2024-09-08 ENCOUNTER — Ambulatory Visit: Admitting: Cardiology

## 2024-09-16 ENCOUNTER — Other Ambulatory Visit: Payer: Self-pay

## 2024-09-16 NOTE — Progress Notes (Signed)
 Specialty Pharmacy Refill Coordination Note  Vincent Cooper is a 83 y.o. male contacted today regarding refills of specialty medication(s) Mepolizumab  (Nucala )   Patient requested Delivery   Delivery date: 09/23/24   Verified address: [address] [place]   Medication will be filled on: 09/22/24

## 2024-09-22 ENCOUNTER — Other Ambulatory Visit: Payer: Self-pay

## 2024-10-09 ENCOUNTER — Other Ambulatory Visit: Payer: Self-pay | Admitting: Cardiology

## 2024-10-13 ENCOUNTER — Other Ambulatory Visit: Payer: Self-pay

## 2024-10-13 NOTE — Progress Notes (Signed)
 Specialty Pharmacy Refill Coordination Note  Vincent Cooper is a 83 y.o. male contacted today regarding refills of specialty medication(s) Mepolizumab  (Nucala )   Patient requested Delivery   Delivery date: 10/22/24   Verified address: [address] [place]   Medication will be filled on: 10/21/24  Spoke with patient's daughter

## 2024-10-20 ENCOUNTER — Other Ambulatory Visit: Payer: Self-pay

## 2024-11-03 ENCOUNTER — Telehealth: Payer: Self-pay

## 2024-11-03 NOTE — Telephone Encounter (Signed)
 It looks as though it has not been released or read yet by Dr. Neysa

## 2024-11-03 NOTE — Telephone Encounter (Signed)
 Patient will need to be scheduled for a split night study

## 2024-11-03 NOTE — Telephone Encounter (Signed)
 Disregard previous message. Pt was no show to first Split Night. It has been sent to be scheduled and will call and inform pt of new appt.

## 2024-11-04 ENCOUNTER — Ambulatory Visit: Admitting: Internal Medicine

## 2024-11-04 VITALS — BP 130/62 | HR 68 | Temp 98.0°F | Ht 69.0 in | Wt 257.0 lb

## 2024-11-04 DIAGNOSIS — J4489 Other specified chronic obstructive pulmonary disease: Secondary | ICD-10-CM

## 2024-11-04 DIAGNOSIS — G4733 Obstructive sleep apnea (adult) (pediatric): Secondary | ICD-10-CM

## 2024-11-04 DIAGNOSIS — R0602 Shortness of breath: Secondary | ICD-10-CM | POA: Diagnosis not present

## 2024-11-04 DIAGNOSIS — D7219 Other eosinophilia: Secondary | ICD-10-CM

## 2024-11-04 DIAGNOSIS — Z87891 Personal history of nicotine dependence: Secondary | ICD-10-CM

## 2024-11-04 NOTE — Progress Notes (Signed)
 Vincent Cooper    [id number]    1941-08-03  Primary Care Physician:Williams, Vaughn FALCON, MD Date of Appointment: 11/04/2024 Established Patient Visit  Chief complaint:   Chief Complaint  Patient presents with   Follow-up    Follow up     HPI: Vincent Cooper is a 83 y.o. 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 Did pulmonary rehab   Interval Updates: Here for follow up for asthma copd overlap syndrome.   Current treatment: Breztri  2 puffs twice a day, albuterol  as needed which he takes very seldom less than once/week, nucala .   He no-showed for his split night study. He was told the study wasn't covered and had no idea one was scheduled. He is still using his CPAP. He needs supplies refill.   He had an ED visit in July for CHF exacerbation but was not admitted.   Did pulmonary rehab at [facility] a couple years ago but his progress was interrupted by a hernia surgery. He does continue to have dyspnea with minimal exertion   OSA on CPAP - [facility] - says his machine doesn't have a chip. Has had this machine about ten years.    Living on his own, independent with ADLs. Drove himself here. Ambulates with walker.    I have reviewed the patient's family social and past medical history and updated as appropriate.   Past Medical History:  Diagnosis Date   CHF (congestive heart failure) (HCC)    Chronic gout    COPD (chronic obstructive pulmonary disease) (HCC)    Oxygen  dependent   Coronary artery disease due to calcified coronary lesion 03/08/2020   CORONARY CTA: Cor Ca++ Score 1651 !!: ~ 50% LM (CTFFR 1 - NS). Large Dom RCA-<PDA-PAV/PL - diffuse mild-mod plaque: prox (25-49%), mid (50-69%) CTFFR (p 0.99, m 0.85, d 0.81 - NS).  Med-Size LAD - prox-mid long diffuse Mod-Severe Ca++ plaque (~50-69%, ?>70%): CTFFR p 0.95, m 0.88, d 0.86 -NS.  CTFFR LCx 0.94, OM1 0.90 - NS. (NS=Not Significant).   Diabetes mellitus type II, non insulin  dependent (HCC)     Hypertension    Morbid obesity (HCC)    BMI of 38.5 with multiple risk factors.   OSA on CPAP    Pulmonary emboli (HCC) 10/2019   Chest CTA-4 Phadke opacification of main PA but there is partially occlusive main posterior RLL and segmental/segmental branches.  Small thrombus noted in the anterior right middle lobe.  No RV strain.  Scattered aortic atherosclerosis involving great vessels.  Coronary calcification noted.   RLS (restless legs syndrome)     Past Surgical History:  Procedure Laterality Date   BOWEL RESECTION N/A 10/18/2020   Procedure: SMALL BOWEL RESECTION;  Surgeon: Kallie Manuelita BROCKS, MD;  Location: [facility];  Service: General;  Laterality: N/A;   Cataract surgery Right    COLONOSCOPY WITH PROPOFOL  N/A 09/27/2020   Procedure: COLONOSCOPY WITH PROPOFOL ;  Surgeon: Golda Claudis PENNER, MD;  Location: [facility];  Service: Endoscopy;  Laterality: N/A;  1:15   COLOSTOMY REVERSAL N/A 10/18/2020   Procedure: COLOSTOMY REVERSAL;  Surgeon: Kallie Manuelita BROCKS, MD;  Location: [facility];  Service: General;  Laterality: N/A;   LAPAROTOMY N/A 06/21/2020   Procedure: EXPLORATORY LAPAROTOMY,  bowel resection, creation ostomy;  Surgeon: Kallie Manuelita BROCKS, MD;  Location: [facility];  Service: General;  Laterality: N/A;   POLYPECTOMY  09/27/2020   Procedure: POLYPECTOMY;  Surgeon: Golda Claudis PENNER, MD;  Location: [facility];  Service: Endoscopy;;   TRANSTHORACIC ECHOCARDIOGRAM  01/31/2023   Normal LV size and function with EF 65 to 70%.  No RWMA.  Unable to assess diastolic function.  Normal RV size and function.  Normal RVP and mildly elevated RAP.  Mild LA dilation and moderate RA dilation.  Moderate AoV calcification with mild AAS (mean AVG 13 mmHg). => Mild AS now present   VENTRAL HERNIA REPAIR N/A 09/02/2022   Procedure: HERNIA REPAIR VENTRAL ADULT WITH  MESH;  Surgeon: Kallie Manuelita BROCKS, MD;  Location: [facility];  Service: General;  Laterality: N/A;    Family History  Problem Relation Age of  Onset   Colon cancer Mother    Emphysema Father        smoked   Diverticulitis Sister    Diabetes Brother    Diabetes Brother    Heart Problems Maternal Grandmother    Breast cancer Daughter    Lupus Son    CAD Neg Hx     Social History   Occupational History   Occupation: retired  Tobacco Use   Smoking status: Former    Current packs/day: 0.00    Average packs/day: 2.0 packs/day for 55.0 years (110.0 ttl pk-yrs)    Types: Cigarettes    Start date: 11/18/1946    Quit date: 11/18/2001    Years since quitting: 22.9   Smokeless tobacco: Former  Building Services Engineer status: Never Used  Substance and Sexual Activity   Alcohol  use: Yes    Comment: sometimes   Drug use: Never   Sexual activity: Not Currently     Physical Exam: Blood pressure 130/62, pulse 68, temperature 98 F (36.7 C), temperature source Oral, height 5' 9 (1.753 m), weight 257 lb (116.6 kg), SpO2 98%.  Gen:     Chronically ill, obese, elderly Lungs:    ctab no wheeze CV:        RRR, systolic murmur MSK: multiple upper extremity ecchymoses  Data Reviewed: Imaging: I have personally reviewed the chest xray July 2025 - cardiomegaly, pulmonary edema  PFTs:     Latest Ref Rng & Units 06/07/2022   12:49 PM  PFT Results  FVC-Pre L 2.61   FVC-Predicted Pre % 77   FVC-Post L 2.60   FVC-Predicted Post % 76   Pre FEV1/FVC % % 67   Post FEV1/FCV % % 67   FEV1-Pre L 1.75   FEV1-Predicted Pre % 73   FEV1-Post L 1.73   DLCO uncorrected ml/min/mmHg 16.34   DLCO UNC% % 76   DLCO corrected ml/min/mmHg 16.34   DLCO COR %Predicted % 76   DLVA Predicted % 87   TLC L 5.60   TLC % Predicted % 89   RV % Predicted % 116    I have personally reviewed the patient's PFTs and mild airflow limitation   Labs: Lab Results  Component Value Date   WBC 9.2 06/08/2024   HGB 14.7 06/08/2024   HCT 43.4 06/08/2024   MCV 98.2 06/08/2024   PLT 171 06/08/2024   Lab Results  Component Value Date   NA 138 06/08/2024    K 4.4 06/08/2024   CO2 25 06/08/2024   GLUCOSE 176 (H) 06/08/2024   BUN 30 (H) 06/08/2024   CREATININE 1.48 (H) 06/08/2024   CALCIUM  9.0 06/08/2024   EGFR 47 (L) 02/20/2023   GFRNONAA 47 (L) 06/08/2024    Immunization status: Immunization History  Administered Date(s) Administered    sv,  Bivalent, Protein Subunit Rsvpref,pf [place]) 09/24/2022   Fluad Trivalent(High Dose 65+) 07/30/2023   Influenza Inj Mdck Quad With Preservative 08/28/2018   Influenza,inj,quad, With Preservative 08/29/2015   PNEUMOCOCCAL CONJUGATE-20 12/25/2022   Tdap 12/25/2022    External Records Personally Reviewed: pulmonary  Assessment:  Asthma COPD overlap syndrome with improved control on nucala  Peripheral eosinophilia Osa on cpap History of tobacco use disorder, quit smoking >15 years ago  Plan/Recommendations: CPAP Titration study ordered. Will refill supplies.   Continue the breztri  2 puffs twice daily, gargle after use. Samples provided.  Continue  albuterol  inhaler as needed for shortness of breath.  Continue the monthly nucala  injections - will renew this today  Will refer to pulmonary rehab at [facility].    Return to Care: Return in about 3 months (around 02/02/2025) for Dr. Pawar.   Verdon Gore, MD Pulmonary and Critical Care Medicine [facility] Office:[phone]

## 2024-11-04 NOTE — Patient Instructions (Addendum)
 It was a pleasure to see you today!  Please schedule follow up with Dr Theodoro in 3 months. Please call sooner [phone] if issues or concerns arise. You can also send us  a message through MyChart, but but aware that this is not to be used for urgent issues and it may take up to 5-7 days to receive a reply. Please be aware that you will likely be able to view your results before I have a chance to respond to them. Please give us  5 business days to respond to any non-urgent results.    Before your next visit I would like you to have: Sleep study - cpap titration  CPAP Titration study ordered. Will refill supplies. Please make sure you get this study so we can update your cpap settings and get you a new machine.   Continue the breztri  2 puffs twice daily, gargle after use. Samples provided.  Continue  albuterol  inhaler as needed for shortness of breath.  Continue the monthly nucala  injections - will renew this today  Will refer to pulmonary rehab at [facility]. This is the exercise program. They will call you to schedule.

## 2024-11-05 ENCOUNTER — Telehealth: Payer: Self-pay | Admitting: *Deleted

## 2024-11-05 NOTE — Telephone Encounter (Signed)
 Spoke with patient and informed him that his CPAP supplies will be shipped to his home, but with the Holidays, it may be the first of the year before he receives those.  He also wanted to know if he could have his Sleep Study done at [facility] and I informed him those are not done a tthat facility.  He voiced his understanding on both matters

## 2024-11-05 NOTE — Telephone Encounter (Signed)
 Patient notified insurance is probably selecting [facility] as his DME. He doesn't want to have to drive to [place], would rather use . Is this possible?    Copied from CRM [id number]. Topic: Clinical - Order For Equipment >> Nov 04, 2024  2:35 PM Vincent Cooper wrote: Reason for CRM: Pt calling in regards to CPAP machine being sent to [place] and not [place]. Pt is a little confused regarding this.

## 2024-11-09 ENCOUNTER — Other Ambulatory Visit: Payer: Self-pay

## 2024-11-12 ENCOUNTER — Ambulatory Visit (HOSPITAL_BASED_OUTPATIENT_CLINIC_OR_DEPARTMENT_OTHER): Attending: Internal Medicine | Admitting: Pulmonary Disease

## 2024-11-12 ENCOUNTER — Other Ambulatory Visit: Payer: Self-pay

## 2024-11-12 ENCOUNTER — Other Ambulatory Visit (HOSPITAL_COMMUNITY): Payer: Self-pay

## 2024-11-12 DIAGNOSIS — G4733 Obstructive sleep apnea (adult) (pediatric): Secondary | ICD-10-CM | POA: Diagnosis present

## 2024-11-12 NOTE — Progress Notes (Signed)
 Specialty Pharmacy Refill Coordination Note  Vincent Cooper is a 83 y.o. male contacted today regarding refills of specialty medication(s) Mepolizumab  (Nucala )   Patient requested Delivery   Delivery date: 11/17/24   Verified address: [address] [place]   Medication will be filled on: 11/16/24

## 2024-11-16 ENCOUNTER — Other Ambulatory Visit: Payer: Self-pay

## 2024-11-20 DIAGNOSIS — G4733 Obstructive sleep apnea (adult) (pediatric): Secondary | ICD-10-CM | POA: Diagnosis not present

## 2024-11-22 ENCOUNTER — Telehealth: Payer: Self-pay | Admitting: Pulmonary Disease

## 2024-11-22 ENCOUNTER — Telehealth: Payer: Self-pay | Admitting: Primary Care

## 2024-11-22 DIAGNOSIS — G4733 Obstructive sleep apnea (adult) (pediatric): Secondary | ICD-10-CM

## 2024-11-22 NOTE — Procedures (Addendum)
 " Indications for Polysomnography The patient is an 83 year old Male who is 5' 9 and weighs 257.0 lbs. His BMI equals 38.1.  A full night titration treatment study was performed.  No medication was taken.No Data. Polysomnogram Data A full night polysomnogram recorded the standard physiologic parameters including EEG, EOG, EMG, EKG, nasal and oral airflow.  Respiratory parameters of chest and abdominal movements were recorded with Respiratory Inductance Plethysmography belts.   Oxygen  saturation was recorded by pulse oximetry.  Sleep Architecture The total recording time of the polysomnogram was 399.0 minutes.  The total sleep time was 291.5 minutes.  The patient spent 4.8% of total sleep time in Stage N1, 35.0% in Stage N2, 40.0% in Stages N3, and 20.2% in REM.  Sleep latency was 10.2 minutes.   REM latency was 98.0 minutes.  Sleep Efficiency was 73.1%.  Wake after Sleep Onset time was 97.0 minutes.  Titration Summary The patient was titrated at pressures ranging from 9* cm/H20 with supplemental oxygen  at - up to 19* cm/H20 with supplemental oxygen  at -.  The last pressure used in the study was 19* cm/H20 with supplemental oxygen  at -.  Respiratory Events The polysomnogram revealed a presence of 9 obstructive, 6 centrals, and - mixed apneas resulting in an Apnea index of 3.1 events per hour.  There were 91 hypopneas (GreaterEqual to3% desaturation and/or arousal) resulting in an Apnea\Hypopnea Index (AHI  GreaterEqual to3% desaturation and/or arousal) of 21.8 events per hour.  There were 54 hypopneas (GreaterEqual to4% desaturation) resulting in an Apnea\Hypopnea Index (AHI GreaterEqual to4% desaturation) of 14.2 events per hour.  There were 7 Respiratory  Effort Related Arousals resulting in a RERA index of 1.4 events per hour. The Respiratory Disturbance Index is 23.3 events per hour.  The snore index was - events per hour.  Mean oxygen  saturation was 92.7%.  The lowest oxygen  saturation  during sleep was 83.0%.  Time spent LessEqual to88% oxygen  saturation was  minutes ().  Limb Activity There were 132 limb movements recorded.  Of this total, 132 were classified as PLMs.  Of the PLMs, 14 were associated with arousals.  The Limb Movement index was 27.2 per hour while the PLM index was 27.2 per hour.  Cardiac Summary The average pulse rate was 73.8 bpm.  The minimum pulse rate was 60.0 bpm while the maximum pulse rate was 100.0 bpm.  Cardiac rhythm was normal/abnormal.  Comments: Your good patient had a titration study for obstructive sleep apnea CPAP was initiated at 9 and gradually increased to maximum pressure of 19.  Sleep disordered breathing events were eliminated, snoring eliminated.  Tolerated CPAP titration well  Diagnosis: Good control of sleep disordered breathing There was some variability with significant decrease in sleep apnea events at higher pressures. Will benefit from continuing CPAP therapy Auto CPAP 15-20 may also be considered Fair sleep efficiency, increased wake after sleep onset, fair sleep architecture No significant periodic limb movement Cardiac rhythm was sinus  Recommendations: May continue current CPAP therapy Auto CPAP 15-20 with heated humidification and heated tubing A large size Simplus fullface mask was tolerated  Avoid alcohol , sedatives and other CNS depressants that may worsen sleep apnea and disrupt normal sleep architecture. Sleep hygiene should be reviewed to assess factors that may improve sleep quality. Weight management and regular exercise should be initiated or continued  Close clinical follow-up optimization of treatment  This study was personally reviewed and electronically signed by: Neda Hammond, MD Accredited Board Certified in Sleep Medicine Date/Time:  11/20/24 "

## 2024-11-22 NOTE — Procedures (Signed)
 [facility] [address] [place], [place] [address] Tel: [phone]   Fax: [phone]  Titration Interpretation  Patient Name:  Vincent Cooper, Vincent Cooper Date:  11/12/2024 Referring Physician:  VERDON GORE [phone]) %%startinterp%% Indications for Polysomnography The patient is an 84 year old Male who is 5' 9 and weighs 257.0 lbs. His BMI equals 38.1.  A full night titration treatment study was performed.  No medication was taken.  No Data.   Polysomnogram Data A full night polysomnogram recorded the standard physiologic parameters including EEG, EOG, EMG, EKG, nasal and oral airflow.  Respiratory parameters of chest and abdominal movements were recorded with Respiratory Inductance Plethysmography belts.  Oxygen  saturation was recorded by pulse oximetry.   Sleep Architecture The total recording time of the polysomnogram was 399.0 minutes.  The total sleep time was 291.5 minutes.  The patient spent 4.8% of total sleep time in Stage N1, 35.0% in Stage N2, 40.0% in Stages N3, and 20.2% in REM.  Sleep latency was 10.2 minutes.  REM latency was 98.0 minutes.  Sleep Efficiency was 73.1%.  Wake after Sleep Onset time was 97.0 minutes.  Titration Summary The patient was titrated at pressures ranging from 9* cm/H20 with supplemental oxygen  at - up to 19* cm/H20 with supplemental oxygen  at -.  The last pressure used in the study was 19* cm/H20 with supplemental oxygen  at -.  Respiratory Events The polysomnogram revealed a presence of 9 obstructive, 6 centrals, and - mixed apneas resulting in an Apnea index of 3.1 events per hour.  There were 91 hypopneas (>=3% desaturation and/or arousal) resulting in an Apnea\Hypopnea Index (AHI >=3% desaturation and/or arousal) of 21.8 events per hour.  There were 54 hypopneas (>=4% desaturation) resulting in an Apnea\Hypopnea Index (AHI >=4% desaturation) of 14.2 events per hour.  There were 7 Respiratory Effort Related  Arousals resulting in a RERA index of 1.4 events per hour. The Respiratory Disturbance Index is 23.3 events per hour.  The snore index was - events per hour.  Mean oxygen  saturation was 92.7%.  The lowest oxygen  saturation during sleep was 83.0%.  Time spent <=88% oxygen  saturation was 9.9 minutes (2.5%).  Limb Activity There were 132 limb movements recorded.  Of this total, 132 were classified as PLMs.  Of the PLMs, 14 were associated with arousals.  The Limb Movement index was 27.2 per hour while the PLM index was 27.2 per hour.  Cardiac Summary The average pulse rate was 73.8 bpm.  The minimum pulse rate was 60.0 bpm while the maximum pulse rate was 100.0 bpm.  Cardiac rhythm was normal/abnormal.  Comments: Your good patient had a titration study for obstructive sleep apnea CPAP was initiated at 9 and gradually increased to maximum pressure of 19.  Sleep disordered breathing events were eliminated, snoring eliminated.  Tolerated CPAP titration well  Diagnosis:  Good control of sleep disordered breathing There was some variability with significant decrease in sleep apnea events at higher pressures. Will benefit from continuing CPAP therapy Auto CPAP 15-20 may also be considered Fair sleep efficiency, increased wake after sleep onset, fair sleep architecture No significant periodic limb movement Cardiac rhythm was sinus  Recommendations: May continue current CPAP therapy Auto CPAP 15-20 with heated humidification and heated tubing A large size Simplus fullface mask was tolerated  Avoid alcohol , sedatives and other CNS depressants that may worsen sleep apnea and disrupt normal sleep architecture. Sleep hygiene should be reviewed to assess factors that may improve sleep quality. Weight management and  regular exercise should be initiated or continued  Close clinical follow-up optimization of treatment  This study was personally reviewed and electronically signed by: Neda Hammond,  MD Accredited Board Certified in Sleep Medicine Date/Time:   11/20/24  %%endinterp%%  Titration Report  Patient Name: Vincent Cooper, Vincent Cooper Study Date: 11/12/2024  Date of Birth: 1941/04/07 Study Type: CPAP Titration  Age: 84 year MRN #: [id number]  Sex: Male Interpreting Physician: NEDA HAMMOND [id number]  Height: 5' 9 Referring Physician: VERDON GORE [phone])  Weight: 257.0 lbs Recording Tech: Hargis Abu RPSGT RST  BMI: 38.1 Scoring Tech: Hargis Abu RPSGT RST  ESS: 6 Neck Size: 17.5  Mask Type Simplus FFM Final Pressure: 19CMH2O  Mask Size: Large Supplemental O2: -   Study Overview  Lights Off: 09:35:28 PM  Count Index  Lights On: 04:14:28 AM Awakenings: 34 7.0  Time in Bed: 399.0 min. Arousals: 107 22.0  Total Sleep Time: 291.5 min. AHI (>=3% Desat and/or Ar.): 106 21.8   Sleep Efficiency: 73.1% AHI (>=4% Desat): 69 14.2   Sleep Latency: 10.2 min. Limb Movements: 132 27.2  Wake After Sleep Onset: 97.0 min. Snore: - -  REM Latency from Sleep Onset: 98.0 min. Desaturations: 111 22.8     Minimum SpO2 TST: 83.0%    Sleep Architecture  % of Time in Bed Stages Time (mins) % Sleep Time  Wake 108.0   Stage N1 14.0 4.8%  Stage N2 102.0 35.0%  Stage N3 116.5 40.0%  REM 59.0 20.2%   Arousal Summary   NREM REM Sleep Index  Respiratory Arousals 36 9 45 9.3  PLM Arousals 14 - 14 2.9  Isolated Limb Movement Arousals - - - -  Snore Arousals - - - -  Spontaneous Arousals 33 16 49 10.1  Total 82 25 107 22.0   Limb Movement Summary   Count Index  Isolated Limb Movements - -  Periodic Limb Movements (PLMs) 132 27.2  Total Limb Movements 132 27.2    Respiratory Summary   By Sleep Stage By Body Position Total   NREM REM Supine Non-Supine   Time (min) 232.5 59.0 - 291.5 291.5         Obstructive Apnea 9 - - 9 9  Mixed Apnea - - - - -  Central Apnea 4 2 - 6 6  Total Apneas 13 2 - 15 15  Total Apnea Index 3.4 2.0 - 3.1 3.1         Hypopneas (>=3% Desat and/or Ar.) 68 23 -  91 91  AHI (>=3% Desat and/or Ar.) 20.9 25.4 - 21.8 21.8         Hypopneas (>=4% Desat) 41 13 - 54 54  AHI (>=4% Desat) 13.9 15.3 - 14.2 14.2          RERAs 6 1 - 7 7  RERA Index 1.5 1.0 - 1.4 1.4         RDI 22.5 26.4 - 23.3 23.3    Respiratory Event Type Index  Central Apneas 1.2  Obstructive Apneas 1.9  Mixed Apneas -  Central Hypopneas -  Obstructive Hypopneas -  Central Apnea + Hypopnea (CAHI) 1.2  Obstructive Apnea + Hypopnea (OAHI) 22.2   Respiratory Event Durations   Apnea Hypopnea   NREM REM NREM REM  Average (seconds) 18.8 16.1 27.9 29.7  Maximum (seconds) 26.7 17.7 59.2 58.5    Oxygen  Saturation Summary   Wake NREM REM TST TIB  Average SpO2 93.0% 92.2% 94.1% 92.5% 92.7%  Minimum SpO2 86.0%  83.0% 84.0% 83.0% 83.0%  Maximum SpO2 99.0% 99.0% 99.0% 99.0% 99.0%   Oxygen  Saturation Distribution  Range (%) Time in range (min) Time in range (%)  90.0 - 100.0 335.6 84.2%  80.0 - 90.0 62.1 15.6%  70.0 - 80.0 - -  60.0 - 70.0 - -  50.0 - 60.0 - -  0.0 - 50.0 - -  Time Spent <=88% SpO2  Range (%) Time in range (min) Time in range (%)  0.0 - 88.0 9.9 2.5%      Count Index  Desaturations 111 22.8    Cardiac Summary   Wake NREM REM Sleep Total  Average Pulse Rate (BPM) 74.2 74.0 72.2 73.6 73.8  Minimum Pulse Rate (BPM) 60.0 61.0 61.0 61.0 60.0  Maximum Pulse Rate (BPM) 100.0 87.0 85.0 87.0 100.0   Pulse Rate Distribution:  Range (bpm) Time in range (min) Time in range (%)  0.0 - 40.0 - -  40.0 - 60.0 0.0 0.0%  60.0 - 80.0 380.4 95.3%  80.0 - 100.0 17.8 4.5%  100.0 - 120.0 - -  120.0 - 140.0 - -  140.0 - 200.0 - -   Titration Summary  PAP Device PAP Level O2 Level Time (min) Wake (min) NREM (min) REM (min) Supine TST (min) Sleep Eff% OA# CA# MA# Hyp# (>=3%) AHI (>=3%) Hyp# (>=4%) AHI (>=%4) RERA RDI SpO2 <=88% (min) Min SpO2 Mean SpO2 Ar. Index  CPAP 9 - 47.0 28.0 19.0 0.0  40.4% 1 - - 20 66.3 14  47.4 2  72.6  1.2 87.0 91.3 47.4  CPAP 10 - 13.0  2.5 10.5 0.0  80.8% 7 - - 10 97.1 4  62.9 1  102.9  1.1 84.0 91.0 62.9  CPAP 12 - 19.0 1.0 18.0 0.0  94.7% - - - 14 46.7 8  26.7 -  46.7  1.9 87.0 90.0 10.0  CPAP 13 - 29.5 2.5 27.0 0.0  91.5% - - - 8 17.8 5  11.1 -  17.8  3.1 83.0 89.8 11.1  CPAP 15 - 53.0 0.5 32.5 20.0  99.1% - 4 - 15 21.7 10  16.0 1  22.9  0.9 84.0 92.6 16.0  CPAP 16 - 155.5 68.0 72.0 15.5  56.3% 1 - - 12 8.9 9  6.9 3  11.0  0.0 90.0 93.3 26.7  CPAP 18 - 75.0 3.0 53.5 18.5  96.0% - 2 - 12 11.7 4  5.0 -  11.7  0.0 90.0 93.6 15.8  CPAP 19 - 7.5 2.5 0.0 5.0  66.7% - - - - - -  - -  -  0.0 95.0 96.2 12.0   Hypnograms                           Technologist Comments  Patient was at Sleep Lab for Sleep Apnea. A CPAP Titration Study was ordered.  Patient was fitted with a large Simplus FFM. CPAP pressure started at [facility] and titrated to CPAP pressure of 19CMH2O with heated humidity and heated tubing. Patient tolerated Cpap pressure well. Respiratory events were eliminated. Snoring was eliminated. Periodic Limb Movement was noted with/without arousals. EKG showed NSR. No medication was taken. No oxygen  was applied.

## 2024-11-22 NOTE — Telephone Encounter (Signed)
 Call patient  Sleep study result  Date of study: 11/12/2024  Impression: Obstructive sleep apnea, tolerated titration well Sleep disordered breathing events well-controlled with CPAP therapy, no significant desaturations  Recommendation:  May continue to use current CPAP therapy  Auto CPAP 15-20 with heated humidification with heated tubing A large size Simplus fullface mask -Order can be called into DME if new machine is needed  Avoid alcohol , sedatives and other CNS depressants that may worsen sleep apnea and disrupt normal sleep architecture. Sleep hygiene should be reviewed to assess factors that may improve sleep quality. Weight management and regular exercise should be initiated or continued  Schedule for follow-up in about 4 to 6 weeks for optimization of therapy

## 2024-11-22 NOTE — Telephone Encounter (Signed)
 See separate encounter.  ATC x1.

## 2024-11-22 NOTE — Telephone Encounter (Signed)
 Please advise patient to continue current CPAP therapy Change settings Auto CPAP 15-20 with heated humidification and heated tubing with A large size Simplus fullface mask was tolerated  Former [facility] patient. FU with new sleep provider in 4-6 weeks

## 2024-11-22 NOTE — Telephone Encounter (Signed)
 ATC x1. Mailbox is full so unable to leave vm

## 2024-11-25 NOTE — Telephone Encounter (Signed)
 I called and spoke to pt. Pt informed of beth's note and verbalized understanding. Pt states he is also in need of a new CPAP machine as his is 46-83 years old. I informed him I could order that as well. Pt verbalized understanding. NFN

## 2024-12-07 ENCOUNTER — Other Ambulatory Visit (HOSPITAL_COMMUNITY): Payer: Self-pay

## 2024-12-08 ENCOUNTER — Other Ambulatory Visit (HOSPITAL_COMMUNITY): Payer: Self-pay

## 2024-12-10 ENCOUNTER — Other Ambulatory Visit: Payer: Self-pay | Admitting: Pharmacy Technician

## 2024-12-10 ENCOUNTER — Other Ambulatory Visit: Payer: Self-pay

## 2024-12-10 NOTE — Progress Notes (Signed)
 Specialty Pharmacy Refill Coordination Note  Vincent Cooper is a 84 y.o. male contacted today regarding refills of specialty medication(s) Mepolizumab  (Nucala )   Patient requested Delivery   Delivery date: 12/16/24   Verified address: [address] [place]   Medication will be filled on: 12/15/24

## 2024-12-14 ENCOUNTER — Telehealth: Payer: Self-pay

## 2024-12-14 NOTE — Telephone Encounter (Signed)
 Copied from CRM #[id number]. Topic: General - Other >> Dec 14, 2024 11:45 AM Benton KIDD wrote: Reason for CRM: jennifer with [facility] we have a cpap order for patient and we are needing a sign and dated diagnostics sleep study . we have a  titration that was performed at the end of 2025 december 26th but we still need the sleep diagnostic [phone] Fax number [phone]

## 2024-12-15 ENCOUNTER — Other Ambulatory Visit (HOSPITAL_COMMUNITY): Payer: Self-pay

## 2024-12-15 ENCOUNTER — Other Ambulatory Visit: Payer: Self-pay

## 2025-01-28 ENCOUNTER — Ambulatory Visit: Admitting: Cardiology

## 2025-02-02 ENCOUNTER — Encounter

## 2025-02-17 ENCOUNTER — Ambulatory Visit: Admitting: Nurse Practitioner
